# Patient Record
Sex: Female | Born: 1955 | Race: White | Hispanic: No | Marital: Married | Smoking: Never smoker
Health system: Southern US, Community
[De-identification: ages and names within clinical notes are randomized; demographics above are authoritative.]

## PROBLEM LIST (undated history)

## (undated) DIAGNOSIS — S14109A Unspecified injury at unspecified level of cervical spinal cord, initial encounter: Secondary | ICD-10-CM

## (undated) DIAGNOSIS — K222 Esophageal obstruction: Secondary | ICD-10-CM

## (undated) DIAGNOSIS — K219 Gastro-esophageal reflux disease without esophagitis: Secondary | ICD-10-CM

## (undated) DIAGNOSIS — N1831 Chronic kidney disease, stage 3a: Secondary | ICD-10-CM

## (undated) DIAGNOSIS — N289 Disorder of kidney and ureter, unspecified: Secondary | ICD-10-CM

## (undated) DIAGNOSIS — E039 Hypothyroidism, unspecified: Secondary | ICD-10-CM

## (undated) DIAGNOSIS — K029 Dental caries, unspecified: Secondary | ICD-10-CM

## (undated) DIAGNOSIS — L03211 Cellulitis of face: Secondary | ICD-10-CM

## (undated) DIAGNOSIS — S329XXA Fracture of unspecified parts of lumbosacral spine and pelvis, initial encounter for closed fracture: Secondary | ICD-10-CM

## (undated) DIAGNOSIS — G9520 Unspecified cord compression: Secondary | ICD-10-CM

## (undated) DIAGNOSIS — J9601 Acute respiratory failure with hypoxia: Secondary | ICD-10-CM

## (undated) DIAGNOSIS — G629 Polyneuropathy, unspecified: Secondary | ICD-10-CM

## (undated) DIAGNOSIS — D759 Disease of blood and blood-forming organs, unspecified: Secondary | ICD-10-CM

## (undated) DIAGNOSIS — M1990 Unspecified osteoarthritis, unspecified site: Secondary | ICD-10-CM

## (undated) DIAGNOSIS — R1310 Dysphagia, unspecified: Secondary | ICD-10-CM

## (undated) DIAGNOSIS — E119 Type 2 diabetes mellitus without complications: Secondary | ICD-10-CM

## (undated) DIAGNOSIS — N179 Acute kidney failure, unspecified: Secondary | ICD-10-CM

## (undated) DIAGNOSIS — G8250 Quadriplegia, unspecified: Secondary | ICD-10-CM

## (undated) DIAGNOSIS — K5790 Diverticulosis of intestine, part unspecified, without perforation or abscess without bleeding: Secondary | ICD-10-CM

## (undated) DIAGNOSIS — S14129A Central cord syndrome at unspecified level of cervical spinal cord, initial encounter: Secondary | ICD-10-CM

## (undated) HISTORY — PX: WISDOM TOOTH EXTRACTION: SHX21

## (undated) HISTORY — PX: ABDOMINAL HYSTERECTOMY: SHX81

## (undated) HISTORY — PX: TUBAL LIGATION: SHX77

## (undated) HISTORY — PX: TEAR DUCT PROBING: SHX793

---

## 2004-12-31 ENCOUNTER — Emergency Department: Payer: Self-pay | Admitting: General Practice

## 2005-04-09 ENCOUNTER — Encounter: Payer: Self-pay | Admitting: Family Medicine

## 2005-04-22 ENCOUNTER — Emergency Department: Payer: Self-pay | Admitting: Emergency Medicine

## 2005-05-07 ENCOUNTER — Encounter: Payer: Self-pay | Admitting: Family Medicine

## 2005-08-16 ENCOUNTER — Other Ambulatory Visit: Payer: Self-pay

## 2005-08-17 ENCOUNTER — Observation Stay: Payer: Self-pay | Admitting: Internal Medicine

## 2006-07-29 ENCOUNTER — Ambulatory Visit: Payer: Self-pay | Admitting: Ophthalmology

## 2006-08-06 ENCOUNTER — Inpatient Hospital Stay: Payer: Self-pay | Admitting: Otolaryngology

## 2006-12-30 ENCOUNTER — Ambulatory Visit: Payer: Self-pay

## 2008-01-09 ENCOUNTER — Emergency Department: Payer: Self-pay | Admitting: Emergency Medicine

## 2008-01-10 ENCOUNTER — Other Ambulatory Visit: Payer: Self-pay

## 2008-12-06 ENCOUNTER — Ambulatory Visit: Payer: Self-pay

## 2009-04-12 ENCOUNTER — Inpatient Hospital Stay: Payer: Self-pay | Admitting: Internal Medicine

## 2012-01-18 ENCOUNTER — Emergency Department: Payer: Self-pay | Admitting: Emergency Medicine

## 2012-01-18 LAB — BASIC METABOLIC PANEL
Anion Gap: 6 — ABNORMAL LOW (ref 7–16)
BUN: 18 mg/dL (ref 7–18)
Calcium, Total: 8.6 mg/dL (ref 8.5–10.1)
Chloride: 108 mmol/L — ABNORMAL HIGH (ref 98–107)
Creatinine: 0.93 mg/dL (ref 0.60–1.30)
EGFR (African American): >60
Glucose: 168 mg/dL — ABNORMAL HIGH (ref 65–99)
Osmolality: 291 (ref 275–301)
Potassium: 3.8 mmol/L (ref 3.5–5.1)

## 2012-01-18 LAB — CBC WITH DIFFERENTIAL/PLATELET
Basophil #: 0 10*3/uL (ref 0.0–0.1)
Eosinophil %: 1.3 %
HCT: 39.2 % (ref 35.0–47.0)
Lymphocyte #: 2.1 10*3/uL (ref 1.0–3.6)
MCV: 88 fL (ref 80–100)
Monocyte #: 0.5 x10 3/mm (ref 0.2–0.9)
Monocyte %: 9.4 %
Neutrophil %: 51.4 %
Platelet: 143 10*3/uL — ABNORMAL LOW (ref 150–440)
RBC: 4.45 10*6/uL (ref 3.80–5.20)
RDW: 13.8 % (ref 11.5–14.5)
WBC: 5.7 10*3/uL (ref 3.6–11.0)

## 2012-01-21 ENCOUNTER — Emergency Department: Payer: Self-pay | Admitting: Internal Medicine

## 2013-02-19 LAB — BASIC METABOLIC PANEL
Chloride: 104 mmol/L (ref 98–107)
Creatinine: 1.26 mg/dL (ref 0.60–1.30)
EGFR (Non-African Amer.): 48 — ABNORMAL LOW
Glucose: 315 mg/dL — ABNORMAL HIGH (ref 65–99)
Osmolality: 288 (ref 275–301)
Sodium: 136 mmol/L (ref 136–145)

## 2013-02-19 LAB — HEPATIC FUNCTION PANEL A (ARMC)
Albumin: 4.2 g/dL (ref 3.4–5.0)
Alkaline Phosphatase: 121 U/L (ref 50–136)
Bilirubin, Direct: <0.1 mg/dL (ref 0.00–0.20)
SGOT(AST): 57 U/L — ABNORMAL HIGH (ref 15–37)
SGPT (ALT): 56 U/L (ref 12–78)

## 2013-02-19 LAB — CBC
HCT: 41.9 % (ref 35.0–47.0)
HGB: 14.8 g/dL (ref 12.0–16.0)
WBC: 7.3 10*3/uL (ref 3.6–11.0)

## 2013-02-19 LAB — URINALYSIS, COMPLETE
Bilirubin,UR: NEGATIVE
Glucose,UR: >=500 mg/dL (ref 0–75)
Hyaline Cast: 10
Nitrite: NEGATIVE
Ph: 5 (ref 4.5–8.0)
Protein: 30
Specific Gravity: 1.023 (ref 1.003–1.030)
WBC UR: 123 /HPF (ref 0–5)

## 2013-02-19 LAB — PRO B NATRIURETIC PEPTIDE: B-Type Natriuretic Peptide: 139 pg/mL — ABNORMAL HIGH (ref 0–125)

## 2013-02-19 LAB — TROPONIN I: Troponin-I: <0.02 ng/mL

## 2013-02-19 LAB — MAGNESIUM: Magnesium: 1.6 mg/dL — ABNORMAL LOW

## 2013-02-20 ENCOUNTER — Observation Stay: Payer: Self-pay | Admitting: Internal Medicine

## 2013-02-20 LAB — TROPONIN I: Troponin-I: 0.05 ng/mL

## 2013-02-20 LAB — HEMOGLOBIN A1C: Hemoglobin A1C: 10.3 % — ABNORMAL HIGH (ref 4.2–6.3)

## 2013-02-20 LAB — TSH: Thyroid Stimulating Horm: 9.96 u[IU]/mL — ABNORMAL HIGH

## 2013-02-21 LAB — BASIC METABOLIC PANEL
Anion Gap: 3 — ABNORMAL LOW (ref 7–16)
Calcium, Total: 8.1 mg/dL — ABNORMAL LOW (ref 8.5–10.1)
EGFR (African American): >60
EGFR (Non-African Amer.): >60

## 2013-02-24 LAB — CULTURE, BLOOD (SINGLE)

## 2013-03-15 ENCOUNTER — Ambulatory Visit: Payer: Self-pay | Admitting: Family Medicine

## 2013-04-06 ENCOUNTER — Ambulatory Visit: Payer: Self-pay | Admitting: Family Medicine

## 2013-10-05 ENCOUNTER — Ambulatory Visit: Payer: Self-pay | Admitting: Family Medicine

## 2014-08-30 ENCOUNTER — Emergency Department: Payer: Self-pay | Admitting: Emergency Medicine

## 2014-10-27 NOTE — H&P (Signed)
PATIENT NAME:  [name], [name] MR#:  [id number] DATE OF BIRTH:  1955/10/02  DATE OF ADMISSION:  02/20/2013  PRIMARY CARE PHYSICIAN: She does not recall the name of her physician, but belongs to  [facility], not locally in this area.   REFERRING PHYSICIAN: Dr. Arman Filter.   CHIEF COMPLAINT: Multitude of complaints that happened all today; including vomiting, questionable syncope, epigastric discomfort, mild left-sided chest pain.   HISTORY OF PRESENT ILLNESS: [name] is a 59 year old Caucasian female who works at [employer] and suddenly today she felt an indigestion-feeling along with heartburn. She went to the bathroom and she regurgitated or vomited some digested food, following which she felt generalized weakness and probably she fainted, but she is unsure. Her husband is with her and he tells me that the employees/staff at [employer] told him that probably she fainted briefly. EMS was called and the patient was transported. En route, she had another episode of vomiting, after which she had mild cough. Thereafter, she stated that she received DuoNeb breathing treatment here at the Emergency Department, after which she developed left-sided chest burning sensation, tingling-like feeling, it was very mild. Along with that, she had some epigastric discomfort and mild left upper quadrant discomfort. She denies any shortness of breath. She is unsure if her symptoms are consistent with her previous pancreatitis that she had a few years ago. However, her lipase here is normal. Her abdomen is soft.  Her chest x-ray is negative and her troponin is normal and, again, her lipase is normal. The patient was admitted for observation and followup on her symptoms. Additionally, she was found to have underlying urinary tract infection.   REVIEW OF SYSTEMS:   CONSTITUTIONAL: Denies any fever. No chills. No fatigue, only after she vomited.  EYES: No blurring of vision. No double vision.  ENT: No hearing  impairment. No sore throat. No dysphagia.  CARDIOVASCULAR: No chest pain, only after the breathing treatment she had some tingling sensation on the left side of the chest. No shortness of breath. No edema.   RESPIRATORY: As above, tingling sensation on the left side of the chest after the breathing treatment. No genuine chest pain. No shortness of breath. She describes mild cough after she vomited. Currently, she is not coughing. She is not short of breath.  GASTROINTESTINAL: As described above. Reports mild epigastric discomfort and left upper quadrant discomfort and this appears to be mild and trivial. Vomiting as described above. No hematemesis, no melena, no diarrhea.  GENITOURINARY: No dysuria. No frequency of urination.  MUSCULOSKELETAL: No joint pain or swelling. No muscular pain or swelling.  INTEGUMENTARY: No skin rash. No ulcers.  NEUROLOGY: No focal weakness. No seizure activity. No headache.  PSYCHIATRY: She appears anxious and does not keep in eye contact and concerned about her condition. No depression.  ENDOCRINE: No polyuria or polydipsia. No heat or cold intolerance.   PAST MEDICAL HISTORY: Hypertension, hyperlipidemia, hypothyroidism. She was on Synthroid, but she stopped the medicine by herself. History of diverticulosis and diverticulitis. History of GI bleed with hematochezia, postpolypectomy that was in 2010.   PAST SURGICAL HISTORY: She says none.   FAMILY HISTORY: Her mother is still alive. She is an 16 year old, suffers from osteoarthritis and some heart problem; not specified. Her father died at age of 106 from a heart attack. She has a brother and sister who have palpitations, and they suffer from diabetes mellitus.   SOCIAL HABITS: Nonsmoker. No history of alcohol abuse or drug abuse.   SOCIAL  HISTORY: She is married, living with her husband. 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   ADMISSION MEDICATIONS:  None. She may take over-the-counter Aleve p.r.n.    ALLERGIES: No known drug allergies.   PHYSICAL EXAMINATION: VITAL SIGNS: Her blood pressure 139/62, respiratory rate 20, pulse 135.  Her pulse is down to normal by the time I examined her. It is in the 90s. Temperature 98.4, oxygen saturation 95%.  GENERAL APPEARANCE: This is a middle-aged female sitting on the stretcher, in no acute distress.   HEENT: Head and neck examination: No pallor. No icterus. No cyanosis. Ear examination revealed normal hearing, no discharge, no lesions. Examination of the nose showed no bleeding, no ulcers, no discharge. Examination of the mouth showed normal lips and tongue. No oral thrush, no exudates, no ulcers. Eye examination revealed normal eyelids and conjunctivae. Both pupils are constricted.  NECK: Supple. Trachea at midline. No thyromegaly. No cervical lymphadenopathy. No masses.  HEART: Normal S1, S2. No S3, S4. No murmur. No gallop. No carotid bruits.  RESPIRATORY: Normal breathing pattern without use of accessory muscles. No rales. No wheezing.  ABDOMEN: Soft, without tenderness even with deep palpation. No rebound. No rigidity. No hernias. No hepatosplenomegaly.  SKIN: No ulcers. No subcutaneous nodules.  MUSCULOSKELETAL: No joint swelling. No clubbing.  NEUROLOGIC: Cranial nerves II through XII are intact. No focal motor deficit.  PSYCHIATRIC: The patient is alert and oriented x3. Mood and affect were flat. The patient does not keep an eye contact and she keeps closing her eyes.   LABORATORY FINDINGS: Her chest x-ray showed no acute cardiopulmonary abnormalities. EKG showed sinus tachycardia at the rate of 108 per minute, right bundle branch block; otherwise, unremarkable EKG. Glucose is elevated at 315, BUN 24, creatinine 1.2, sodium 136, potassium 4. Magnesium 1.6. Normal liver function tests and liver transaminases, except for mild elevation of AST at 57. Troponin less than 0.02. CBC showed white count of 7000, hemoglobin 14, hematocrit 41, platelet  count 150. D-dimer 0.4. Urinalysis showed cloudy urine, more than 500 glucose, +3 leukocyte esterase and 123 white blood cells, +2 bacteria. ABG showed a pH of 7.38, pCO2 of 34, pO2 of 79 and that was on room air.   ASSESSMENT: 1.  Vomiting, likely secondary to gastritis.  2. Questionable syncope after having generalized weakness after her vomiting. This could be vasovagal episode.  3.  Trivial mild left-sided chest pain in the form of tingling sensation after her DuoNeb treatment.  4.  Urinary tract infection. 5. Elevated blood sugar and indicating underlying diabetes mellitus. The patient is unaware about it.  6.  Systemic hypertension.  7.  Hypothyroidism. The patient has stopped the Synthroid.  8.  History of hyperlipidemia.  9.  Right bundle branch block by EKG. 10.  Noncompliance.  11.  History of diverticulosis. 12.  History of pancreatitis.   PLAN: We will admit the patient for observation on telemetry, follow up on troponin. IV hydration with normal saline. Zofran p.r.n. for nausea and vomiting. I will place the patient on liquids. Monitor blood sugar; Accu-Chek and sliding scale. Advance BRAT diet as tolerated. I will place the patient on PPI like Protonix. Monitor for any recurrence of syncope or near syncope. The patient is at mild risk for DVT, therefore, no anticoagulation was given.   Time spent in evaluating this patient took more than 55 minutes.     ____________________________ Clovis Pu. Lenore Manner, MD amd:nts D: 02/20/2013 02:02:00 ET T: 02/20/2013 02:30:30 ET JOB#: [id number]  cc: Mike Craze  Irven Coe, MD, <Dictator> Mike Craze Irven Coe MD ELECTRONICALLY SIGNED 02/20/2013 5:46

## 2014-10-27 NOTE — Discharge Summary (Signed)
PATIENT NAME:  [name], [name] MR#:  [id number] DATE OF BIRTH:  [birth year]  DATE OF ADMISSION:  02/20/2013 DATE OF DISCHARGE:  02/21/2013  ADMITTING PHYSICIAN: Dr. Lenore Manner.  DISCHARGING PHYSICIAN: Dr. Gladstone Lighter.  PRIMARY CARE PHYSICIAN: None.   Sawyer: None.   DISCHARGE DIAGNOSES: 1.  Vasovagal syncope.  2.  Gastritis with nausea and vomiting.  3.  New-onset diabetes mellitus with hemoglobin A1c of 10.3.  4.  Severe diabetic peripheral neuropathy.  5.  Hypothyroidism.   DISCHARGE HOME MEDICATIONS:  1.  Metformin 1000 mg p.o. b.i.d.  2.  Glipizide 5 mg p.o. daily in the morning.  3.  Levothyroxine 50 mcg p.o. daily.  5.  Gabapentin 100 mg p.o. b.i.d.   DISCHARGE DIET: ADA 1800 calorie diet.   DISCHARGE ACTIVITY:  As tolerated.   FOLLOWUP INSTRUCTIONS:  PCP followup in 1 to 2 weeks.   LABORATORY DATA AND IMAGING STUDIES PRIOR TO DISCHARGE: Sodium 142, potassium 3.9, chloride 113, bicarb 26, BUN 16, creatinine 0.82, glucose 97 and calcium of 8.1.   HbA1c is 10.3, free thyroxine is 0.87. TSH is elevated at 9.96. D-dimer was normal at 0.43.   ALT 56, AST 57, alk phos 121, total bilirubin 0.4 and albumin of 4.2. Lipase 364. Blood cultures remain negative. Urine cultures growing Strep agalactiae. Chest x-ray showing mildly prominent interstitial markings.   WBC 7.3, hemoglobin 14.8, hematocrit 41.9. Platelet count is 150.   BRIEF HOSPITAL COURSE: [name] is a 59 year old female with a past medical history significant for only hypertension and hyperlipidemia, not taking any medications at home, presents to the hospital secondary to syncopal episode after an episode of severe nausea and vomiting.  1.  Syncope, triggered by nausea, vomiting, likely vasovagal in nature. Given IV fluids, monitored on telemetry. No arrhythmias were noted. No further syncopal episodes while in the hospital.  2.  New onset diabetes mellitus with HbA1c of 10.3. Likely, the  patient's nausea and vomiting was triggered by her elevated sugars. Was noted to have sugars in the 300s. The patient has financial problems and does not want to be started on insulin, so she was started on metformin and glipizide. Surprisingly, her sugars were very well controlled with those 2 medications while in the hospital. She had a dietitian consult and also diabetic teaching was done about lifestyle modifications prior to discharge.  3. Diabetic neuropathy. Started on Neurontin in the hospital because Lyrica was expensive. The patient has shown improvement in her symptoms, so was being discharged home on the same.   Her course has been otherwise uneventful in the hospital.   DISCHARGE CONDITION: Stable.   DISCHARGE DISPOSITION: Home.   TIME SPENT ON DISCHARGE: 40 minutes.     ____________________________ Gladstone Lighter, MD rk:dmm D: 02/23/2013 14:57:42 ET T: 02/23/2013 20:11:20 ET JOB#: [id number]  cc: Gladstone Lighter, MD, <Dictator> Gladstone Lighter MD ELECTRONICALLY SIGNED 02/28/2013 22:15

## 2014-11-11 ENCOUNTER — Emergency Department
Admission: EM | Admit: 2014-11-11 | Discharge: 2014-11-12 | Disposition: A | Discharge status: Home / Self Care | Payer: BLUE CROSS/BLUE SHIELD | Attending: Emergency Medicine | Admitting: Emergency Medicine

## 2014-11-11 ENCOUNTER — Emergency Department: Payer: BLUE CROSS/BLUE SHIELD

## 2014-11-11 DIAGNOSIS — Y998 Other external cause status: Secondary | ICD-10-CM | POA: Diagnosis not present

## 2014-11-11 DIAGNOSIS — Y9389 Activity, other specified: Secondary | ICD-10-CM | POA: Diagnosis not present

## 2014-11-11 DIAGNOSIS — W228XXA Striking against or struck by other objects, initial encounter: Secondary | ICD-10-CM | POA: Insufficient documentation

## 2014-11-11 DIAGNOSIS — Y9289 Other specified places as the place of occurrence of the external cause: Secondary | ICD-10-CM | POA: Insufficient documentation

## 2014-11-11 DIAGNOSIS — S62525A Nondisplaced fracture of distal phalanx of left thumb, initial encounter for closed fracture: Secondary | ICD-10-CM | POA: Insufficient documentation

## 2014-11-11 DIAGNOSIS — S62502A Fracture of unspecified phalanx of left thumb, initial encounter for closed fracture: Secondary | ICD-10-CM

## 2014-11-11 DIAGNOSIS — S6992XA Unspecified injury of left wrist, hand and finger(s), initial encounter: Secondary | ICD-10-CM | POA: Diagnosis present

## 2014-11-11 MED ORDER — IBUPROFEN 800 MG PO TABS
800.0000 mg | ORAL_TABLET | Freq: Once | ORAL | Status: AC
  Administered 2014-11-12: 800 mg via ORAL

## 2014-11-11 MED ORDER — IBUPROFEN 800 MG PO TABS: 800.0000 mg | ORAL_TABLET | Freq: Three times a day (TID) | ORAL | Status: DC | PRN

## 2014-11-11 MED ORDER — TRAMADOL HCL 50 MG PO TABS
50.0000 mg | ORAL_TABLET | Freq: Once | ORAL | Status: AC
  Administered 2014-11-12: 50 mg via ORAL

## 2014-11-11 MED ORDER — TRAMADOL HCL 50 MG PO TABS
50.0000 mg | ORAL_TABLET | Freq: Four times a day (QID) | ORAL | Status: DC | PRN
Start: 2014-11-11 — End: 2015-10-05

## 2014-11-11 NOTE — ED Notes (Signed)
Pt presents to ER stating left thumb pain after broom hit it. Pt has swelling and bruising noted. Ice pack applied.

## 2014-11-11 NOTE — Discharge Instructions (Signed)

## 2014-11-11 NOTE — ED Provider Notes (Signed)
[facility] Emergency Department Provider Note    ____________________________________________  Time seen: 2240  I have reviewed the triage vital signs and the nursing notes.   HISTORY  Chief Complaint Finger Injury       HPI [name] is a 59 y.o. female hit her left thumb on a broom handle has swelling and bruising to the area rates pain as about 5-6 out of 10 with movement and relieved with rest is been in a splint for protection denies any numbness tingling or weakness there is some mild swelling no other associated signs or symptoms except for noted     No past medical history on file.  There are no active problems to display for this patient.   No past surgical history on file.  Current Outpatient Rx  Name  Route  Sig  Dispense  Refill  . ibuprofen (ADVIL,MOTRIN) 800 MG tablet   Oral   Take 1 tablet (800 mg total) by mouth every 8 (eight) hours as needed.   30 tablet   0   . traMADol (ULTRAM) 50 MG tablet   Oral   Take 1 tablet (50 mg total) by mouth every 6 (six) hours as needed.   12 tablet   0     Allergies Review of patient's allergies indicates no known allergies.  No family history on file.  Social History History  Substance Use Topics  . Smoking status: Not on file  . Smokeless tobacco: Not on file  . Alcohol Use: Not on file    Review of Systems  Constitutional: Negative for fever. Eyes: Negative for visual changes. ENT: Negative for sore throat. Cardiovascular: Negative for chest pain. Respiratory: Negative for shortness of breath. Gastrointestinal: Negative for abdominal pain, vomiting and diarrhea. Genitourinary: Negative for dysuria. Musculoskeletal: Negative for back pain. Skin: Negative for rash. Neurological: Negative for headaches, focal weakness or numbness.   10-point ROS otherwise negative.  ____________________________________________   PHYSICAL EXAM:  VITAL SIGNS: ED Triage  Vitals  Enc Vitals Group     BP 11/11/14 2112 134/65 mmHg     Pulse Rate 11/11/14 2112 95     Resp 11/11/14 2112 20     Temp 11/11/14 2112 97.1 F (36.2 C)     Temp Source 11/11/14 2112 Oral     SpO2 11/11/14 2112 96 %     Weight 11/11/14 2112 150 lb (68.04 kg)     Height 11/11/14 2112 5\' 7"  (1.702 m)     Head Cir --      Peak Flow --      Pain Score 11/11/14 2112 7     Pain Loc --      Pain Edu? --      Excl. in [place]? --      Constitutional: Alert and oriented. Well appearing and in no distress. Eyes: Conjunctivae are normal. PERRL. Normal extraocular movements. ENT   Head: Normocephalic and atraumatic.   Nose: No congestion/rhinnorhea.   Mouth/Throat: Mucous membranes are moist.   Neck: No stridor.  Cardiovascular: Normal rate, regular rhythm. Normal and symmetric distal pulses are present in all extremities. No murmurs, rubs, or gallops. Respiratory: Normal respiratory effort without tachypnea nor retractions. Breath sounds are clear and equal bilaterally. No wheezes/rales/rhonchi.  Musculoskeletal: Tenderness with palpation to the left thumb but has a full range of motion no palpable deformities or abnormalities Neurologic:  Normal speech and language. No gross focal neurologic deficits are appreciated. Speech is normal. No gait instability. Skin:  Bruising to the left thumb Psychiatric: Mood and affect are normal. Speech and behavior are normal. Patient exhibits appropriate insight and judgment.  ____________________________________________        RADIOLOGY  IMPRESSION: Suspected nondisplaced intra-articular fracture at base of distal phalanx LEFT thumb ; recommend correlation for pain/tenderness at this site.  Degenerative changes LEFT first MCP joint.   Electronically Signed By: Lavonia Dana M.D. On: 11/11/2014 21:46  ____________________________________________   PROCEDURES  Procedure(s) performed: None  Critical Care performed:  No  ____________________________________________   INITIAL IMPRESSION / ASSESSMENT AND PLAN / ED COURSE  Pertinent labs & imaging results that were available during my care of the patient were reviewed by me and considered in my medical decision making (see chart for details).  Undisplaced fracture at the base of the distal phalanx of the left thumb patient be placed in a splint given prescription for Ultram and Motrin follow-up with Ortholign one week  ____________________________________________   FINAL CLINICAL IMPRESSION(S) / ED DIAGNOSES  Final diagnoses:  Thumb fracture, left, closed, initial encounter    Jonita Hirota Verdene Rio, PA-C 11/11/14 [place], MD 11/12/14 [phone]

## 2014-11-12 MED ORDER — IBUPROFEN 800 MG PO TABS
ORAL_TABLET | ORAL | Status: AC
  Administered 2014-11-12: 800 mg via ORAL
  Filled 2014-11-12: qty 1

## 2014-11-12 MED ORDER — TRAMADOL HCL 50 MG PO TABS
ORAL_TABLET | ORAL | Status: AC
  Administered 2014-11-12: 50 mg via ORAL
  Filled 2014-11-12: qty 1

## 2014-11-12 NOTE — ED Notes (Signed)
Discharge instructions reviewed with patient. Questions fielded by this RN. Patient verbalizes understanding of instructions. Patient discharged home in stable condition per ruffian. No acute distress noted at time of discharge. No peripheral IV placed this visit.

## 2015-03-03 ENCOUNTER — Encounter: Payer: Self-pay | Admitting: Emergency Medicine

## 2015-03-03 ENCOUNTER — Emergency Department
Admission: EM | Admit: 2015-03-03 | Discharge: 2015-03-03 | Disposition: A | Discharge status: Home / Self Care | Payer: BLUE CROSS/BLUE SHIELD | Attending: Emergency Medicine | Admitting: Emergency Medicine

## 2015-03-03 DIAGNOSIS — N3090 Cystitis, unspecified without hematuria: Secondary | ICD-10-CM | POA: Insufficient documentation

## 2015-03-03 DIAGNOSIS — F329 Major depressive disorder, single episode, unspecified: Secondary | ICD-10-CM | POA: Insufficient documentation

## 2015-03-03 DIAGNOSIS — R531 Weakness: Secondary | ICD-10-CM

## 2015-03-03 DIAGNOSIS — E119 Type 2 diabetes mellitus without complications: Secondary | ICD-10-CM | POA: Insufficient documentation

## 2015-03-03 DIAGNOSIS — R5383 Other fatigue: Secondary | ICD-10-CM | POA: Diagnosis present

## 2015-03-03 HISTORY — DX: Type 2 diabetes mellitus without complications: E11.9

## 2015-03-03 LAB — URINALYSIS COMPLETE WITH MICROSCOPIC (ARMC ONLY)
Bilirubin Urine: NEGATIVE
Glucose, UA: >500 mg/dL — AB
HGB URINE DIPSTICK: NEGATIVE
Ketones, ur: NEGATIVE mg/dL
Nitrite: NEGATIVE
PH: 5 (ref 5.0–8.0)
Protein, ur: 30 mg/dL — AB
Specific Gravity, Urine: 1.017 (ref 1.005–1.030)

## 2015-03-03 LAB — BASIC METABOLIC PANEL
Anion gap: 13 (ref 5–15)
BUN: 32 mg/dL — ABNORMAL HIGH (ref 6–20)
CHLORIDE: 104 mmol/L (ref 101–111)
CO2: 22 mmol/L (ref 22–32)
CREATININE: 1.35 mg/dL — AB (ref 0.44–1.00)
Calcium: 9.7 mg/dL (ref 8.9–10.3)
GFR calc non Af Amer: 43 mL/min — ABNORMAL LOW (ref >60)
GFR, EST AFRICAN AMERICAN: 50 mL/min — AB (ref >60)
Glucose, Bld: 241 mg/dL — ABNORMAL HIGH (ref 65–99)
Potassium: 4.5 mmol/L (ref 3.5–5.1)
Sodium: 139 mmol/L (ref 135–145)

## 2015-03-03 LAB — CBC
HCT: 40.7 % (ref 35.0–47.0)
Hemoglobin: 13.6 g/dL (ref 12.0–16.0)
MCH: 28.3 pg (ref 26.0–34.0)
MCHC: 33.3 g/dL (ref 32.0–36.0)
MCV: 85 fL (ref 80.0–100.0)
PLATELETS: 161 10*3/uL (ref 150–440)
RBC: 4.79 MIL/uL (ref 3.80–5.20)
RDW: 13.3 % (ref 11.5–14.5)
WBC: 11 10*3/uL (ref 3.6–11.0)

## 2015-03-03 MED ORDER — SULFAMETHOXAZOLE-TRIMETHOPRIM 800-160 MG PO TABS: 1.0000 | ORAL_TABLET | Freq: Two times a day (BID) | ORAL | Status: DC

## 2015-03-03 MED ORDER — DEXTROSE 5 % IV SOLN
1.0000 g | Freq: Once | INTRAVENOUS | Status: AC
  Administered 2015-03-03: 1 g via INTRAVENOUS
  Filled 2015-03-03: qty 10

## 2015-03-03 MED ORDER — DIAZEPAM 5 MG PO TABS: 5.0000 mg | ORAL_TABLET | Freq: Three times a day (TID) | ORAL | Status: AC | PRN

## 2015-03-03 MED ORDER — LORAZEPAM 2 MG/ML IJ SOLN
1.0000 mg | Freq: Once | INTRAMUSCULAR | Status: AC
  Administered 2015-03-03: 1 mg via INTRAVENOUS
  Filled 2015-03-03: qty 1

## 2015-03-03 MED ORDER — SODIUM CHLORIDE 0.9 % IV SOLN
Freq: Once | INTRAVENOUS | Status: AC
  Administered 2015-03-03: 19:00:00 via INTRAVENOUS
  Filled 2015-03-03: qty 1000

## 2015-03-03 NOTE — Discharge Instructions (Signed)
Fatigue °Fatigue is a feeling of tiredness, lack of energy, lack of motivation, or feeling tired all the time. Having enough rest, good nutrition, and reducing stress will normally reduce fatigue. Consult your caregiver if it persists. The nature of your fatigue will help your caregiver to find out its cause. The treatment is based on the cause.  °CAUSES  °There are many causes for fatigue. Most of the time, fatigue can be traced to one or more of your habits or routines. Most causes fit into one or more of three general areas. They are: °Lifestyle problems °· Sleep disturbances. °· Overwork. °· Physical exertion. °· Unhealthy habits. °· Poor eating habits or eating disorders. °· Alcohol and/or drug use . °· Lack of proper nutrition (malnutrition). °Psychological problems °· Stress and/or anxiety problems. °· Depression. °· Grief. °· Boredom. °Medical Problems or Conditions °· Anemia. °· Pregnancy. °· Thyroid gland problems. °· Recovery from major surgery. °· Continuous pain. °· Emphysema or asthma that is not well controlled °· Allergic conditions. °· Diabetes. °· Infections (such as mononucleosis). °· Obesity. °· Sleep disorders, such as sleep apnea. °· Heart failure or other heart-related problems. °· Cancer. °· Kidney disease. °· Liver disease. °· Effects of certain medicines such as antihistamines, cough and cold remedies, prescription pain medicines, heart and blood pressure medicines, drugs used for treatment of cancer, and some antidepressants. °SYMPTOMS  °The symptoms of fatigue include:  °· Lack of energy. °· Lack of drive (motivation). °· Drowsiness. °· Feeling of indifference to the surroundings. °DIAGNOSIS  °The details of how you feel help guide your caregiver in finding out what is causing the fatigue. You will be asked about your present and past health condition. It is important to review all medicines that you take, including prescription and non-prescription items. A thorough exam will be done.  You will be questioned about your feelings, habits, and normal lifestyle. Your caregiver may suggest blood tests, urine tests, or other tests to look for common medical causes of fatigue.  °TREATMENT  °Fatigue is treated by correcting the underlying cause. For example, if you have continuous pain or depression, treating these causes will improve how you feel. Similarly, adjusting the dose of certain medicines will help in reducing fatigue.  °HOME CARE INSTRUCTIONS  °· Try to get the required amount of good sleep every night. °· Eat a healthy and nutritious diet, and drink enough water throughout the day. °· Practice ways of relaxing (including yoga or meditation). °· Exercise regularly. °· Make plans to change situations that cause stress. Act on those plans so that stresses decrease over time. Keep your work and personal routine reasonable. °· Avoid street drugs and minimize use of alcohol. °· Start taking a daily multivitamin after consulting your caregiver. °SEEK MEDICAL CARE IF:  °· You have persistent tiredness, which cannot be accounted for. °· You have fever. °· You have unintentional weight loss. °· You have headaches. °· You have disturbed sleep throughout the night. °· You are feeling sad. °· You have constipation. °· You have dry skin. °· You have gained weight. °· You are taking any new or different medicines that you suspect are causing fatigue. °· You are unable to sleep at night. °· You develop any unusual swelling of your legs or other parts of your body. °SEEK IMMEDIATE MEDICAL CARE IF:  °· You are feeling confused. °· Your vision is blurred. °· You feel faint or pass out. °· You develop severe headache. °· You develop severe abdominal, pelvic, or   back pain.  You develop chest pain, shortness of breath, or an irregular or fast heartbeat.  You are unable to pass a normal amount of urine.  You develop abnormal bleeding such as bleeding from the rectum or you vomit blood.  You have thoughts  about harming yourself or committing suicide.  You are worried that you might harm someone else. MAKE SURE YOU:   Understand these instructions.  Will watch your condition.  Will get help right away if you are not doing well or get worse. Document Released: 04/20/2007 Document Revised: 09/15/2011 Document Reviewed: 10/25/2013 [facility] Patient Information 2015 [place], [place]. This information is not intended to replace advice given to you by your health care provider. Make sure you discuss any questions you have with your health care provider.  Urinary Tract Infection Urinary tract infections (UTIs) can develop anywhere along your urinary tract. Your urinary tract is your body's drainage system for removing wastes and extra water. Your urinary tract includes two kidneys, two ureters, a bladder, and a urethra. Your kidneys are a pair of bean-shaped organs. Each kidney is about the size of your fist. They are located below your ribs, one on each side of your spine. CAUSES Infections are caused by microbes, which are microscopic organisms, including fungi, viruses, and bacteria. These organisms are so small that they can only be seen through a microscope. Bacteria are the microbes that most commonly cause UTIs. SYMPTOMS  Symptoms of UTIs may vary by age and gender of the patient and by the location of the infection. Symptoms in young women typically include a frequent and intense urge to urinate and a painful, burning feeling in the bladder or urethra during urination. Older women and men are more likely to be tired, shaky, and weak and have muscle aches and abdominal pain. A fever may mean the infection is in your kidneys. Other symptoms of a kidney infection include pain in your back or sides below the ribs, nausea, and vomiting. DIAGNOSIS To diagnose a UTI, your caregiver will ask you about your symptoms. Your caregiver also will ask to provide a urine sample. The urine sample will be tested for  bacteria and white blood cells. White blood cells are made by your body to help fight infection. TREATMENT  Typically, UTIs can be treated with medication. Because most UTIs are caused by a bacterial infection, they usually can be treated with the use of antibiotics. The choice of antibiotic and length of treatment depend on your symptoms and the type of bacteria causing your infection. HOME CARE INSTRUCTIONS  If you were prescribed antibiotics, take them exactly as your caregiver instructs you. Finish the medication even if you feel better after you have only taken some of the medication.  Drink enough water and fluids to keep your urine clear or pale yellow.  Avoid caffeine, tea, and carbonated beverages. They tend to irritate your bladder.  Empty your bladder often. Avoid holding urine for long periods of time.  Empty your bladder before and after sexual intercourse.  After a bowel movement, women should cleanse from front to back. Use each tissue only once. SEEK MEDICAL CARE IF:   You have back pain.  You develop a fever.  Your symptoms do not begin to resolve within 3 days. SEEK IMMEDIATE MEDICAL CARE IF:   You have severe back pain or lower abdominal pain.  You develop chills.  You have nausea or vomiting.  You have continued burning or discomfort with urination. MAKE SURE YOU:  Understand these instructions.  Will watch your condition.  Will get help right away if you are not doing well or get worse. Document Released: 04/02/2005 Document Revised: 12/23/2011 Document Reviewed: 08/01/2011 [facility] Patient Information 2015 [place], [place]. This information is not intended to replace advice given to you by your health care provider. Make sure you discuss any questions you have with your health care provider.

## 2015-03-03 NOTE — ED Notes (Signed)
Reports started having bodyaches today at work. Skin w/d. BS 260 at triage

## 2015-03-03 NOTE — ED Provider Notes (Signed)
[facility] Emergency Department Provider Note     Time seen:   I have reviewed the triage vital signs and the nursing notes.   HISTORY  Chief Complaint Fatigue    HPI [name] is a 59 y.o. female who presents to ER for body aches and weakness. Husband was concerned because she drank a Pepsi today and she is diabetic. According to report her blood sugar was in the 200s. She denies fevers, chills, chest pain, shortness of breath, nausea vomiting diarrhea. She complains that she is weak, her body hurts.   Past Medical History  Diagnosis Date  . Diabetes mellitus without complication     There are no active problems to display for this patient.   History reviewed. No pertinent past surgical history.  Allergies Review of patient's allergies indicates no known allergies.  Social History Social History  Substance Use Topics  . Smoking status: Never Smoker   . Smokeless tobacco: None  . Alcohol Use: No    Review of Systems Constitutional: Negative for fever. Eyes: Negative for visual changes. ENT: Negative for sore throat. Cardiovascular: Negative for chest pain. Respiratory: Negative for shortness of breath. Gastrointestinal: Negative for abdominal pain, vomiting and diarrhea. Genitourinary: Negative for dysuria. Musculoskeletal: Negative for back pain. Skin: Negative for rash. Neurological: Negative for headaches, positive for weakness  10-point ROS otherwise negative.  ____________________________________________   PHYSICAL EXAM:  VITAL SIGNS: ED Triage Vitals  Enc Vitals Group     BP 03/03/15 1701 144/80 mmHg     Pulse Rate 03/03/15 1701 103     Resp 03/03/15 1701 18     Temp 03/03/15 1701 98.6 F (37 C)     Temp Source 03/03/15 1701 Oral     SpO2 03/03/15 1701 98 %     Weight 03/03/15 1701 145 lb (65.772 kg)     Height 03/03/15 1701 5\' 7"  (1.702 m)     Head Cir --      Peak Flow --      Pain Score --      Pain Loc  --      Pain Edu? --      Excl. in [place]? --     Constitutional: Alert and oriented. Well appearing and in no distress. Eyes: Conjunctivae are normal. PERRL. Normal extraocular movements. ENT   Head: Normocephalic and atraumatic.   Nose: No congestion/rhinnorhea.   Mouth/Throat: Mucous membranes are moist.   Neck: No stridor. Cardiovascular: Normal rate, regular rhythm. Normal and symmetric distal pulses are present in all extremities. No murmurs, rubs, or gallops. Respiratory: Normal respiratory effort without tachypnea nor retractions. Breath sounds are clear and equal bilaterally. No wheezes/rales/rhonchi. Gastrointestinal: Soft and nontender. No distention. No abdominal bruits.  Musculoskeletal: Nontender with normal range of motion in all extremities. No joint effusions.  No lower extremity tenderness nor edema. Neurologic:  Normal speech and language. No gross focal neurologic deficits are appreciated. Speech is normal. No gait instability. Skin:  Skin is warm, dry and intact. No rash noted. Psychiatric: Depressed mood and affect, admits to being stressed by her mom yesterday ____________________________________________  ED COURSE:  Pertinent labs & imaging results that were available during my care of the patient were reviewed by me and considered in my medical decision making (see chart for details). Patient likely with depression or anxiety. No clear medical etiology for her symptoms. ____________________________________________    LABS (pertinent positives/negatives)  Labs Reviewed  BASIC METABOLIC PANEL - Abnormal; Notable for the following:  Glucose, Bld 241 (*)    BUN 32 (*)    Creatinine, Ser 1.35 (*)    GFR calc non Af Amer 43 (*)    GFR calc Af Amer 50 (*)    All other components within normal limits  URINALYSIS COMPLETEWITH MICROSCOPIC ([facility] ONLY) - Abnormal; Notable for the following:    Color, Urine YELLOW (*)    APPearance HAZY (*)    Glucose,  UA >500 (*)    Protein, ur 30 (*)    Leukocytes, UA 3+ (*)    Bacteria, UA RARE (*)    Squamous Epithelial / LPF 0-5 (*)    All other components within normal limits  CBC   ____________________________________________  FINAL ASSESSMENT AND PLAN  Weakness, myalgias, cystitis  Plan: Patient with labs and imaging as dictated above. Patient appeared dehydrated, was given a liter saline, urine culture was sent and she appeared to have a significant UTI. She was also given IV Rocephin 1 g. She is stable for outpatient follow-up with discharge with Bactrim DS.   Earleen Newport, MD   Earleen Newport, MD 03/03/15 [phone]

## 2015-03-03 NOTE — ED Notes (Signed)
MD at bedside. 

## 2015-03-08 ENCOUNTER — Other Ambulatory Visit: Payer: Self-pay | Admitting: Family Medicine

## 2015-03-08 DIAGNOSIS — Z1231 Encounter for screening mammogram for malignant neoplasm of breast: Secondary | ICD-10-CM

## 2015-03-08 DIAGNOSIS — N63 Unspecified lump in unspecified breast: Secondary | ICD-10-CM

## 2015-03-29 ENCOUNTER — Other Ambulatory Visit: Payer: BLUE CROSS/BLUE SHIELD

## 2015-03-29 ENCOUNTER — Ambulatory Visit: Payer: BLUE CROSS/BLUE SHIELD

## 2015-06-12 ENCOUNTER — Ambulatory Visit
Admission: RE | Admit: 2015-06-12 | Discharge: 2015-06-12 | Disposition: A | Discharge status: Home / Self Care | Payer: BLUE CROSS/BLUE SHIELD | Source: Ambulatory Visit | Attending: Family Medicine | Admitting: Family Medicine

## 2015-06-12 DIAGNOSIS — R928 Other abnormal and inconclusive findings on diagnostic imaging of breast: Secondary | ICD-10-CM | POA: Diagnosis not present

## 2015-06-12 DIAGNOSIS — N63 Unspecified lump in unspecified breast: Secondary | ICD-10-CM

## 2015-06-12 DIAGNOSIS — Z1231 Encounter for screening mammogram for malignant neoplasm of breast: Secondary | ICD-10-CM

## 2015-08-11 ENCOUNTER — Emergency Department: Payer: BLUE CROSS/BLUE SHIELD

## 2015-08-11 ENCOUNTER — Emergency Department
Admission: EM | Admit: 2015-08-11 | Discharge: 2015-08-11 | Disposition: A | Discharge status: Home / Self Care | Payer: BLUE CROSS/BLUE SHIELD | Attending: Emergency Medicine | Admitting: Emergency Medicine

## 2015-08-11 ENCOUNTER — Encounter: Payer: Self-pay | Admitting: Emergency Medicine

## 2015-08-11 DIAGNOSIS — Y998 Other external cause status: Secondary | ICD-10-CM | POA: Insufficient documentation

## 2015-08-11 DIAGNOSIS — Z7984 Long term (current) use of oral hypoglycemic drugs: Secondary | ICD-10-CM | POA: Diagnosis not present

## 2015-08-11 DIAGNOSIS — S20212A Contusion of left front wall of thorax, initial encounter: Secondary | ICD-10-CM | POA: Diagnosis not present

## 2015-08-11 DIAGNOSIS — Y9289 Other specified places as the place of occurrence of the external cause: Secondary | ICD-10-CM | POA: Insufficient documentation

## 2015-08-11 DIAGNOSIS — Y9389 Activity, other specified: Secondary | ICD-10-CM | POA: Diagnosis not present

## 2015-08-11 DIAGNOSIS — E119 Type 2 diabetes mellitus without complications: Secondary | ICD-10-CM | POA: Diagnosis not present

## 2015-08-11 DIAGNOSIS — Z792 Long term (current) use of antibiotics: Secondary | ICD-10-CM | POA: Insufficient documentation

## 2015-08-11 DIAGNOSIS — W010XXA Fall on same level from slipping, tripping and stumbling without subsequent striking against object, initial encounter: Secondary | ICD-10-CM | POA: Diagnosis not present

## 2015-08-11 DIAGNOSIS — S299XXA Unspecified injury of thorax, initial encounter: Secondary | ICD-10-CM | POA: Diagnosis present

## 2015-08-11 HISTORY — DX: Unspecified osteoarthritis, unspecified site: M19.90

## 2015-08-11 MED ORDER — IBUPROFEN 800 MG PO TABS
800.0000 mg | ORAL_TABLET | Freq: Once | ORAL | Status: AC
  Administered 2015-08-11: 800 mg via ORAL
  Filled 2015-08-11: qty 1

## 2015-08-11 MED ORDER — OXYCODONE-ACETAMINOPHEN 5-325 MG PO TABS: 1.0000 | ORAL_TABLET | Freq: Four times a day (QID) | ORAL | Status: DC | PRN

## 2015-08-11 MED ORDER — OXYCODONE-ACETAMINOPHEN 5-325 MG PO TABS
1.0000 | ORAL_TABLET | Freq: Once | ORAL | Status: AC
Start: 2015-08-11 — End: 2015-08-11
  Administered 2015-08-11: 1 via ORAL
  Filled 2015-08-11: qty 1

## 2015-08-11 MED ORDER — IBUPROFEN 800 MG PO TABS: 800.0000 mg | ORAL_TABLET | Freq: Three times a day (TID) | ORAL | Status: DC | PRN

## 2015-08-11 MED ORDER — BACITRACIN ZINC 500 UNIT/GM EX OINT
TOPICAL_OINTMENT | Freq: Once | CUTANEOUS | Status: AC
  Administered 2015-08-11: 1 via TOPICAL
  Filled 2015-08-11: qty 0.9
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
## 2015-08-11 NOTE — Discharge Instructions (Signed)
Chest Contusion A chest contusion is a deep bruise on your chest area. Contusions are the result of an injury that caused bleeding under the skin. A chest contusion may involve bruising of the skin, muscles, or ribs. The contusion may turn blue, purple, or yellow. Minor injuries will give you a painless contusion, but more severe contusions may stay painful and swollen for a few weeks. CAUSES  A contusion is usually caused by a blow, trauma, or direct force to an area of the body. SYMPTOMS   Swelling and redness of the injured area.  Discoloration of the injured area.  Tenderness and soreness of the injured area.  Pain. DIAGNOSIS  The diagnosis can be made by taking a history and performing a physical exam. An X-ray, CT scan, or MRI may be needed to determine if there were any associated injuries, such as broken bones (fractures) or internal injuries. TREATMENT  Often, the best treatment for a chest contusion is resting, icing, and applying cold compresses to the injured area. Deep breathing exercises may be recommended to reduce the risk of pneumonia. Over-the-counter medicines may also be recommended for pain control. HOME CARE INSTRUCTIONS   Put ice on the injured area.  Put ice in a plastic bag.  Place a towel between your skin and the bag.  Leave the ice on for 15-20 minutes, 03-04 times a day.  Only take over-the-counter or prescription medicines as directed by your caregiver. Your caregiver may recommend avoiding anti-inflammatory medicines (aspirin, ibuprofen, and naproxen) for 48 hours because these medicines may increase bruising.  Rest the injured area.  Perform deep-breathing exercises as directed by your caregiver.  Stop smoking if you smoke.  Do not lift objects over 5 pounds (2.3 kg) for 3 days or longer if recommended by your caregiver. SEEK IMMEDIATE MEDICAL CARE IF:   You have increased bruising or swelling.  You have pain that is getting worse.  You have  difficulty breathing.  You have dizziness, weakness, or fainting.  You have blood in your urine or stool.  You cough up or vomit blood.  Your swelling or pain is not relieved with medicines. MAKE SURE YOU:   Understand these instructions.  Will watch your condition.  Will get help right away if you are not doing well or get worse.   This information is not intended to replace advice given to you by your health care provider. Make sure you discuss any questions you have with your health care provider.   Document Released: 03/18/2001 Document Revised: 03/17/2012 Document Reviewed: 12/15/2011 Elsevier Interactive Patient Education 2016 [employer].   Take pain medicine as directed. Follow-up with your physician if not improving or return to the emergency room for any concerns.
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
## 2015-10-05 ENCOUNTER — Encounter: Payer: Self-pay | Admitting: Emergency Medicine

## 2015-10-05 ENCOUNTER — Emergency Department
Admission: EM | Admit: 2015-10-05 | Discharge: 2015-10-05 | Disposition: A | Discharge status: Home / Self Care | Payer: BLUE CROSS/BLUE SHIELD | Attending: Emergency Medicine | Admitting: Emergency Medicine

## 2015-10-05 ENCOUNTER — Emergency Department: Payer: BLUE CROSS/BLUE SHIELD

## 2015-10-05 DIAGNOSIS — Z7984 Long term (current) use of oral hypoglycemic drugs: Secondary | ICD-10-CM | POA: Insufficient documentation

## 2015-10-05 DIAGNOSIS — S79921A Unspecified injury of right thigh, initial encounter: Secondary | ICD-10-CM | POA: Insufficient documentation

## 2015-10-05 DIAGNOSIS — Z792 Long term (current) use of antibiotics: Secondary | ICD-10-CM | POA: Insufficient documentation

## 2015-10-05 DIAGNOSIS — M25561 Pain in right knee: Secondary | ICD-10-CM

## 2015-10-05 DIAGNOSIS — E119 Type 2 diabetes mellitus without complications: Secondary | ICD-10-CM | POA: Insufficient documentation

## 2015-10-05 DIAGNOSIS — Y998 Other external cause status: Secondary | ICD-10-CM | POA: Insufficient documentation

## 2015-10-05 DIAGNOSIS — Y9389 Activity, other specified: Secondary | ICD-10-CM | POA: Diagnosis not present

## 2015-10-05 DIAGNOSIS — S8991XA Unspecified injury of right lower leg, initial encounter: Secondary | ICD-10-CM | POA: Insufficient documentation

## 2015-10-05 DIAGNOSIS — Y92410 Unspecified street and highway as the place of occurrence of the external cause: Secondary | ICD-10-CM | POA: Insufficient documentation

## 2015-10-05 MED ORDER — HYDROCODONE-ACETAMINOPHEN 5-325 MG PO TABS
1.0000 | ORAL_TABLET | Freq: Once | ORAL | Status: AC
  Administered 2015-10-05: 1 via ORAL

## 2015-10-05 MED ORDER — HYDROCODONE-ACETAMINOPHEN 5-325 MG PO TABS
ORAL_TABLET | ORAL | Status: AC
  Administered 2015-10-05: 1 via ORAL
  Filled 2015-10-05: qty 1

## 2015-10-05 MED ORDER — HYDROCODONE-ACETAMINOPHEN 5-325 MG PO TABS: 1.0000 | ORAL_TABLET | ORAL | Status: DC | PRN
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
## 2015-10-05 NOTE — ED Notes (Signed)
Reviewed d/c instructions, prescriptions, follow-up care, and use of ice/elevation. Pt verbalized understanding.
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
## 2015-10-05 NOTE — Discharge Instructions (Signed)
Cryotherapy Cryotherapy is when you put ice on your injury. Ice helps lessen pain and puffiness (swelling) after an injury. Ice works the best when you start using it in the first 24 to 48 hours after an injury. HOME CARE  Put a dry or damp towel between the ice pack and your skin.  You may press gently on the ice pack.  Leave the ice on for no more than 10 to 20 minutes at a time.  Check your skin after 5 minutes to make sure your skin is okay.  Rest at least 20 minutes between ice pack uses.  Stop using ice when your skin loses feeling (numbness).  Do not use ice on someone who cannot tell you when it hurts. This includes small children and people with memory problems (dementia). GET HELP RIGHT AWAY IF:  You have white spots on your skin.  Your skin turns blue or pale.  Your skin feels waxy or hard.  Your puffiness gets worse. MAKE SURE YOU:   Understand these instructions.  Will watch your condition.  Will get help right away if you are not doing well or get worse.   This information is not intended to replace advice given to you by your health care provider. Make sure you discuss any questions you have with your health care provider.   Document Released: 12/10/2007 Document Revised: 09/15/2011 Document Reviewed: 02/13/2011 Elsevier Interactive Patient Education 2016 [place] to your right knee as needed for pain and swelling. Elevate to decrease swelling. Use Ace wrap for support. Norco as needed for severe pain and begin taking ibuprofen with food for pain and inflammation. Follow-up with your primary care doctor if any continued problems.
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
## 2015-11-04 ENCOUNTER — Encounter: Payer: Self-pay | Admitting: Emergency Medicine

## 2015-11-04 ENCOUNTER — Emergency Department: Payer: BLUE CROSS/BLUE SHIELD

## 2015-11-04 ENCOUNTER — Inpatient Hospital Stay
Admission: EM | Admit: 2015-11-04 | Discharge: 2015-11-06 | DRG: 193 | Disposition: A | Discharge status: Home / Self Care | Payer: BLUE CROSS/BLUE SHIELD | Attending: Internal Medicine | Admitting: Internal Medicine

## 2015-11-04 ENCOUNTER — Inpatient Hospital Stay (HOSPITAL_COMMUNITY)
Admit: 2015-11-04 | Discharge: 2015-11-04 | Disposition: A | Discharge status: Home / Self Care | Payer: BLUE CROSS/BLUE SHIELD | Attending: Internal Medicine | Admitting: Internal Medicine

## 2015-11-04 DIAGNOSIS — K219 Gastro-esophageal reflux disease without esophagitis: Secondary | ICD-10-CM | POA: Diagnosis present

## 2015-11-04 DIAGNOSIS — M1990 Unspecified osteoarthritis, unspecified site: Secondary | ICD-10-CM | POA: Diagnosis present

## 2015-11-04 DIAGNOSIS — Z803 Family history of malignant neoplasm of breast: Secondary | ICD-10-CM

## 2015-11-04 DIAGNOSIS — Z7984 Long term (current) use of oral hypoglycemic drugs: Secondary | ICD-10-CM | POA: Diagnosis not present

## 2015-11-04 DIAGNOSIS — R0902 Hypoxemia: Secondary | ICD-10-CM

## 2015-11-04 DIAGNOSIS — J9601 Acute respiratory failure with hypoxia: Secondary | ICD-10-CM | POA: Diagnosis present

## 2015-11-04 DIAGNOSIS — E1122 Type 2 diabetes mellitus with diabetic chronic kidney disease: Secondary | ICD-10-CM | POA: Diagnosis present

## 2015-11-04 DIAGNOSIS — Z833 Family history of diabetes mellitus: Secondary | ICD-10-CM

## 2015-11-04 DIAGNOSIS — Z79899 Other long term (current) drug therapy: Secondary | ICD-10-CM

## 2015-11-04 DIAGNOSIS — J189 Pneumonia, unspecified organism: Secondary | ICD-10-CM

## 2015-11-04 DIAGNOSIS — N170 Acute kidney failure with tubular necrosis: Secondary | ICD-10-CM | POA: Diagnosis present

## 2015-11-04 DIAGNOSIS — E039 Hypothyroidism, unspecified: Secondary | ICD-10-CM | POA: Diagnosis present

## 2015-11-04 DIAGNOSIS — N182 Chronic kidney disease, stage 2 (mild): Secondary | ICD-10-CM | POA: Diagnosis present

## 2015-11-04 DIAGNOSIS — R0600 Dyspnea, unspecified: Secondary | ICD-10-CM | POA: Diagnosis not present

## 2015-11-04 DIAGNOSIS — G629 Polyneuropathy, unspecified: Secondary | ICD-10-CM | POA: Diagnosis present

## 2015-11-04 LAB — GLUCOSE, CAPILLARY
GLUCOSE-CAPILLARY: 167 mg/dL — AB (ref 65–99)
GLUCOSE-CAPILLARY: 337 mg/dL — AB (ref 65–99)
Glucose-Capillary: 34 mg/dL — CL (ref 65–99)

## 2015-11-04 LAB — RAPID INFLUENZA A&B ANTIGENS (ARMC ONLY)
INFLUENZA A (ARMC): NEGATIVE
INFLUENZA B (ARMC): NEGATIVE

## 2015-11-04 LAB — BASIC METABOLIC PANEL
Anion gap: 14 (ref 5–15)
BUN: 38 mg/dL — AB (ref 6–20)
CALCIUM: 9.4 mg/dL (ref 8.9–10.3)
CO2: 21 mmol/L — ABNORMAL LOW (ref 22–32)
CREATININE: 1.75 mg/dL — AB (ref 0.44–1.00)
Chloride: 104 mmol/L (ref 101–111)
GFR calc non Af Amer: 31 mL/min — ABNORMAL LOW (ref >60)
GFR, EST AFRICAN AMERICAN: 36 mL/min — AB (ref >60)
Glucose, Bld: 130 mg/dL — ABNORMAL HIGH (ref 65–99)
Potassium: 3.6 mmol/L (ref 3.5–5.1)
SODIUM: 139 mmol/L (ref 135–145)

## 2015-11-04 LAB — CBC
HCT: 42.7 % (ref 35.0–47.0)
Hemoglobin: 14.4 g/dL (ref 12.0–16.0)
MCH: 28.1 pg (ref 26.0–34.0)
MCHC: 33.7 g/dL (ref 32.0–36.0)
MCV: 83.5 fL (ref 80.0–100.0)
PLATELETS: 141 10*3/uL — AB (ref 150–440)
RBC: 5.12 MIL/uL (ref 3.80–5.20)
RDW: 14.1 % (ref 11.5–14.5)
WBC: 5.9 10*3/uL (ref 3.6–11.0)

## 2015-11-04 LAB — ECHOCARDIOGRAM COMPLETE
HEIGHTINCHES: 67 in
Weight: 2288 oz

## 2015-11-04 LAB — TROPONIN I

## 2015-11-04 MED ORDER — DEXTROSE 5 % IV SOLN
1.0000 g | Freq: Once | INTRAVENOUS | Status: AC
  Administered 2015-11-04: 1 g via INTRAVENOUS
  Filled 2015-11-04: qty 10

## 2015-11-04 MED ORDER — GABAPENTIN 100 MG PO CAPS
100.0000 mg | ORAL_CAPSULE | Freq: Two times a day (BID) | ORAL | Status: DC
  Administered 2015-11-04 – 2015-11-06 (×5): 100 mg via ORAL
  Filled 2015-11-04 (×7): qty 1

## 2015-11-04 MED ORDER — GLIPIZIDE 5 MG PO TABS
5.0000 mg | ORAL_TABLET | Freq: Every day | ORAL | Status: DC
  Administered 2015-11-04: 5 mg via ORAL
  Filled 2015-11-04: qty 1

## 2015-11-04 MED ORDER — DEXTROSE 50 % IV SOLN
INTRAVENOUS | Status: AC
  Administered 2015-11-04: 50 mL
  Filled 2015-11-04: qty 50

## 2015-11-04 MED ORDER — DIAZEPAM 5 MG PO TABS: 5.0000 mg | ORAL_TABLET | Freq: Three times a day (TID) | ORAL | Status: DC | PRN

## 2015-11-04 MED ORDER — HYDROCODONE-ACETAMINOPHEN 5-325 MG PO TABS
ORAL_TABLET | ORAL | Status: AC
  Administered 2015-11-04: 1 via ORAL
  Filled 2015-11-04: qty 1

## 2015-11-04 MED ORDER — ACETAMINOPHEN 650 MG RE SUPP: 650.0000 mg | Freq: Four times a day (QID) | RECTAL | Status: DC | PRN

## 2015-11-04 MED ORDER — TRAZODONE HCL 50 MG PO TABS: 25.0000 mg | ORAL_TABLET | Freq: Every evening | ORAL | Status: DC | PRN

## 2015-11-04 MED ORDER — LEVOTHYROXINE SODIUM 50 MCG PO TABS
50.0000 ug | ORAL_TABLET | Freq: Every day | ORAL | Status: DC
  Administered 2015-11-05 – 2015-11-06 (×2): 50 ug via ORAL
  Filled 2015-11-04 (×2): qty 1

## 2015-11-04 MED ORDER — DEXTROSE 50 % IV SOLN: 25.0000 g | Freq: Once | INTRAVENOUS | Status: DC

## 2015-11-04 MED ORDER — SODIUM CHLORIDE 0.9 % IV SOLN
INTRAVENOUS | Status: DC
  Administered 2015-11-04: 14:00:00 via INTRAVENOUS

## 2015-11-04 MED ORDER — SODIUM CHLORIDE 0.9 % IV BOLUS (SEPSIS)
1000.0000 mL | Freq: Once | INTRAVENOUS | Status: AC
  Administered 2015-11-04: 1000 mL via INTRAVENOUS

## 2015-11-04 MED ORDER — CETYLPYRIDINIUM CHLORIDE 0.05 % MT LIQD
7.0000 mL | Freq: Two times a day (BID) | OROMUCOSAL | Status: DC
  Administered 2015-11-04 – 2015-11-06 (×3): 7 mL via OROMUCOSAL

## 2015-11-04 MED ORDER — ACETAMINOPHEN 325 MG PO TABS: 650.0000 mg | ORAL_TABLET | Freq: Four times a day (QID) | ORAL | Status: DC | PRN

## 2015-11-04 MED ORDER — BISACODYL 10 MG RE SUPP: 10.0000 mg | Freq: Once | RECTAL | Status: DC

## 2015-11-04 MED ORDER — IPRATROPIUM-ALBUTEROL 0.5-2.5 (3) MG/3ML IN SOLN
3.0000 mL | Freq: Four times a day (QID) | RESPIRATORY_TRACT | Status: DC
  Administered 2015-11-04 – 2015-11-06 (×7): 3 mL via RESPIRATORY_TRACT
  Filled 2015-11-04 (×8): qty 3

## 2015-11-04 MED ORDER — HEPARIN SODIUM (PORCINE) 5000 UNIT/ML IJ SOLN
5000.0000 [IU] | Freq: Three times a day (TID) | INTRAMUSCULAR | Status: DC
  Administered 2015-11-04 – 2015-11-05 (×3): 5000 [IU] via SUBCUTANEOUS
  Filled 2015-11-04 (×3): qty 1

## 2015-11-04 MED ORDER — ONDANSETRON HCL 4 MG/2ML IJ SOLN: 4.0000 mg | Freq: Four times a day (QID) | INTRAMUSCULAR | Status: DC | PRN

## 2015-11-04 MED ORDER — DEXTROSE 5 % IV SOLN
500.0000 mg | Freq: Once | INTRAVENOUS | Status: AC
  Administered 2015-11-04: 500 mg via INTRAVENOUS
  Filled 2015-11-04: qty 500

## 2015-11-04 MED ORDER — PNEUMOCOCCAL VAC POLYVALENT 25 MCG/0.5ML IJ INJ
0.5000 mL | INJECTION | INTRAMUSCULAR | Status: AC
  Administered 2015-11-06: 0.5 mL via INTRAMUSCULAR
  Filled 2015-11-04 (×2): qty 0.5

## 2015-11-04 MED ORDER — ONDANSETRON HCL 4 MG/2ML IJ SOLN
4.0000 mg | Freq: Once | INTRAMUSCULAR | Status: AC
  Administered 2015-11-04: 4 mg via INTRAVENOUS

## 2015-11-04 MED ORDER — DEXTROSE 5 % IV SOLN
1.0000 g | INTRAVENOUS | Status: DC
  Administered 2015-11-04: 1 g via INTRAVENOUS
  Filled 2015-11-04 (×2): qty 10

## 2015-11-04 MED ORDER — ONDANSETRON HCL 4 MG/2ML IJ SOLN
INTRAMUSCULAR | Status: AC
  Administered 2015-11-04: 4 mg via INTRAVENOUS
  Filled 2015-11-04: qty 2

## 2015-11-04 MED ORDER — ONDANSETRON HCL 4 MG PO TABS: 4.0000 mg | ORAL_TABLET | Freq: Four times a day (QID) | ORAL | Status: DC | PRN

## 2015-11-04 MED ORDER — DOCUSATE SODIUM 100 MG PO CAPS
100.0000 mg | ORAL_CAPSULE | Freq: Two times a day (BID) | ORAL | Status: DC
  Administered 2015-11-04 – 2015-11-06 (×4): 100 mg via ORAL
  Filled 2015-11-04 (×5): qty 1

## 2015-11-04 MED ORDER — BISACODYL 5 MG PO TBEC: 5.0000 mg | DELAYED_RELEASE_TABLET | Freq: Every day | ORAL | Status: DC | PRN

## 2015-11-04 MED ORDER — DEXTROSE 5 % IV SOLN
500.0000 mg | INTRAVENOUS | Status: DC
  Administered 2015-11-04: 500 mg via INTRAVENOUS
  Filled 2015-11-04: qty 500

## 2015-11-04 MED ORDER — HYDROCOD POLST-CPM POLST ER 10-8 MG/5ML PO SUER
5.0000 mL | Freq: Two times a day (BID) | ORAL | Status: DC
  Administered 2015-11-04 – 2015-11-06 (×5): 5 mL via ORAL
  Filled 2015-11-04 (×5): qty 5

## 2015-11-04 MED ORDER — HYDROCODONE-ACETAMINOPHEN 5-325 MG PO TABS
1.0000 | ORAL_TABLET | Freq: Once | ORAL | Status: AC
  Administered 2015-11-04: 1 via ORAL

## 2015-11-04 NOTE — ED Provider Notes (Signed)
[facility] Emergency Department Provider Note   ____________________________________________  Time seen: Approximately 11:21 AM  I have reviewed the triage vital signs and the nursing notes.   HISTORY  Chief Complaint Weakness; Chest Pain; and Cough    HPI [name] is a 60 y.o. female with diabetes and arthritis who presents for evaluation of 2 days of productive cough, chest pain with cough, generalized weakness, nonbloody diarrhea, gradual onset, constant since onset, no modifying factors. She tried to go to work today at [employer] and they called EMS because she was feeling so weak. She denies fevers but she has had chills. No abdominal pain, nausea or vomiting. No dysuria.   Past Medical History  Diagnosis Date  . Diabetes mellitus without complication ([place])   . Arthritis     There are no active problems to display for this patient.   History reviewed. No pertinent past surgical history.  Current Outpatient Rx  Name  Route  Sig  Dispense  Refill  . diazepam (VALIUM) 5 MG tablet   Oral   Take 1 tablet (5 mg total) by mouth every 8 (eight) hours as needed for anxiety.   20 tablet   0   . HYDROcodone-acetaminophen (NORCO/VICODIN) 5-325 MG tablet   Oral   Take 1 tablet by mouth every 4 (four) hours as needed for moderate pain.   20 tablet   0   . ibuprofen (ADVIL,MOTRIN) 800 MG tablet   Oral   Take 1 tablet (800 mg total) by mouth every 8 (eight) hours as needed.   30 tablet   0   . ibuprofen (ADVIL,MOTRIN) 800 MG tablet   Oral   Take 1 tablet (800 mg total) by mouth every 8 (eight) hours as needed.   15 tablet   0   . metFORMIN (GLUMETZA) 500 MG (MOD) 24 hr tablet   Oral   Take 500 mg by mouth daily with breakfast.         . sulfamethoxazole-trimethoprim (BACTRIM DS) 800-160 MG per tablet   Oral   Take 1 tablet by mouth 2 (two) times daily.   20 tablet   0     Allergies Review of patient's allergies indicates no  known allergies.  Family History  Problem Relation Age of Onset  . Breast cancer Maternal Aunt     Social History Social History  Substance Use Topics  . Smoking status: Never Smoker   . Smokeless tobacco: None  . Alcohol Use: No    Review of Systems Constitutional: No fever, +chills Eyes: No visual changes. ENT: No sore throat. Cardiovascular: +chest pain. Respiratory: +shortness of breath. Gastrointestinal: No abdominal pain.  No nausea, no vomiting.  + diarrhea.  No constipation. Genitourinary: Negative for dysuria. Musculoskeletal: Negative for back pain. Skin: Negative for rash. Neurological: Negative for headaches, focal weakness or numbness.  10-point ROS otherwise negative.  ____________________________________________   PHYSICAL EXAM:  VITAL SIGNS: ED Triage Vitals  Enc Vitals Group     BP 11/04/15 1022 95/70 mmHg     Pulse Rate 11/04/15 1022 103     Resp 11/04/15 1022 18     Temp 11/04/15 1022 97.9 F (36.6 C)     Temp Source 11/04/15 1022 Oral     SpO2 11/04/15 1022 93 %     Weight 11/04/15 1022 143 lb (64.864 kg)     Height 11/04/15 1022 5\' 7"  (1.702 m)     Head Cir --  Peak Flow --      Pain Score 11/04/15 1023 5     Pain Loc --      Pain Edu? --      Excl. in Ama? --     Constitutional: Alert and oriented. Ill appearing, coughing vigorously. Eyes: Conjunctivae are normal. PERRL. EOMI. Head: Atraumatic. Nose: No congestion/rhinnorhea. Mouth/Throat: Mucous membranes are dry.  Oropharynx non-erythematous. Neck: No stridor. Supple without meningismus. Cardiovascular: Tachycardic rate, regular rhythm. Grossly normal heart sounds.  Good peripheral circulation. Respiratory: Intermittent mild tachypnea, no increased work of breathing. Diminished breath sounds in the right lower lung fields. Gastrointestinal: Soft and nontender. No distention.  No CVA tenderness. Genitourinary: deferred Musculoskeletal: No lower extremity tenderness nor edema.   No joint effusions. Tenderness to the vision throughout the anterior chest wall. Neurologic:  Normal speech and language. No gross focal neurologic deficits are appreciated.  Skin:  Skin is warm, dry and intact. No rash noted. Psychiatric: Mood and affect are normal. Speech and behavior are normal.  ____________________________________________   LABS (all labs ordered are listed, but only abnormal results are displayed)  Labs Reviewed  CBC - Abnormal; Notable for the following:    Platelets 141 (*)    All other components within normal limits  BASIC METABOLIC PANEL - Abnormal; Notable for the following:    CO2 21 (*)    Glucose, Bld 130 (*)    BUN 38 (*)    Creatinine, Ser 1.75 (*)    GFR calc non Af Amer 31 (*)    GFR calc Af Amer 36 (*)    All other components within normal limits  CULTURE, BLOOD (ROUTINE X 2)  CULTURE, BLOOD (ROUTINE X 2)  RAPID INFLUENZA A&B ANTIGENS ([facility] ONLY)  TROPONIN I  URINALYSIS COMPLETEWITH MICROSCOPIC ([facility] ONLY)   ____________________________________________  EKG  ED ECG REPORT I, Joanne Gavel, the attending physician, personally viewed and interpreted this ECG.   Date: 11/04/2015  EKG Time: 10:16  Rate: 113  Rhythm: sinus tachycardic  Axis: normal  Intervals:right bundle branch block  ST&T Change: No acute ST elevation. Right bundle branch block is appreciated on previous EKGs.  ____________________________________________  RADIOLOGY  CXR  IMPRESSION: Right base opacity worrisome for pneumonia. ____________________________________________   PROCEDURES  Procedure(s) performed: None  Critical Care performed: No  ____________________________________________   INITIAL IMPRESSION / ASSESSMENT AND PLAN / ED COURSE  Pertinent labs & imaging results that were available during my care of the patient were reviewed by me and considered in my medical decision making (see chart for details).  [name] is a 60 y.o. female  with diabetes and arthritis who presents for evaluation of 2 days of productive cough, chest pain with cough, generalized weakness, nonbloody diarrhea. On exam, she appears ill, coughing frequently. Her oxygen oxygen saturation was 88% on room air while she was resting, this improved to 95% with 2 L of abdominal oxygen via nasal cannula. She has diminished breath sounds in the right lung base and chest x-ray confirms right sided opacity which is concerning for pneumonia. We'll treat with IV fluids, ceftriaxone and azithromycin. Negative troponin and I suspect her chest pain is muscl0skeletal in nature. CBC unremarkable with the exception of mild thrombocytopenia, platelet count 141. BMP notable for BUN and creatinine elevation, likely prerenal azotemia, continue IV fluids. Case discussed with the hospitalist, Dr. Vianne Bulls, for admission at 11:45 AM. ____________________________________________   FINAL CLINICAL IMPRESSION(S) / ED DIAGNOSES  Final diagnoses:  Hypoxia  CAP (community acquired pneumonia)  NEW MEDICATIONS STARTED DURING THIS VISIT:  New Prescriptions   No medications on file     Note:  This document was prepared using Dragon voice recognition software and may include unintentional dictation errors.    Joanne Gavel, MD 11/04/15 1150

## 2015-11-04 NOTE — H&P (Signed)
[facility] [place] NAME: [name]    MR#:  [id number]  DATE OF BIRTH:  1956-04-05  DATE OF ADMISSION:  11/04/2015  PRIMARY CARE PHYSICIAN: Maryland Pink, MD   REQUESTING/REFERRING PHYSICIAN: Dr. Brunilda Payor gayle  CHIEF COMPLAINT: Cough, generalized weakness.    Chief Complaint  Patient presents with  . Weakness  . Chest Pain  . Cough    HISTORY OF PRESENT ILLNESS:  [name]  is a 60 y.o. female with a known history of diabetes mellitus type 2, hypothyroidism, arthritis, brought in because of generalized weakness, cough for the past 2 days. Patient noted to have cough and green phlegm for 2 days associated with chest pain due to cough. Has mild shortness of breath, hypoxia with oxygen saturation 88% on room air in the emergency room, chest x-ray showing pneumonia in the right lower lobe. Patient noted to have diarrhea this morning associated with weakness. Decreased by mouth intake for the past 2 days. No nausea or vomiting.  PAST MEDICAL HISTORY:   Past Medical History  Diagnosis Date  . Diabetes mellitus without complication ([place])   . Arthritis     PAST SURGICAL HISTOIRY:  History reviewed. No pertinent past surgical history. Surgical history significant for tubal ligation.  Social History  Substance Use Topics  . Smoking status: Never Smoker   . Smokeless tobacco: Not on file  . Alcohol Use: No    FAMILY HISTORY:   Family History  Problem Relation Age of Onset  . Breast cancer Maternal Aunt    father has diabetes, sister also has diabetes.  DRUG ALLERGIES:  No Known Allergies  REVIEW OF SYSTEMS:  CONSTITUTIONAL:Patient complains of lot of fatigue. S: No blurred or double vision.  EARS, NOSE, AND THROAT: No tinnitus or ear pain.  RESPIRATORY Cough, shortness of breath, pleuritic chest pain for last 2 days. Cardiovascular: : has  pleuritic chest pain.  orthopnea, edema.  GASTROINTESTINAL: Has  nausea, diarrhea. No appetite as well. : No polyuria, nocturia,  HEMATOLOGY: No anemia, easy bruising or bleeding SKIN: No rash or lesion. MUSCULOSKELETAL: No joint pain or arthritis.   NEUROLOGIC: No tingling, numbness, weakness.  PSYCHIATRY: No anxiety or depression.   MEDICATIONS AT HOME:   Prior to Admission medications   Medication Sig Start Date End Date Taking? Authorizing Provider  gabapentin (NEURONTIN) 100 MG capsule Take 100 mg by mouth 2 (two) times daily. 08/16/15  Yes Historical Provider, MD  glipiZIDE (GLUCOTROL) 5 MG tablet Take 5 mg by mouth daily. 08/15/15  Yes Historical Provider, MD  levothyroxine (SYNTHROID, LEVOTHROID) 50 MCG tablet Take 50 mcg by mouth daily before breakfast. Take 30 to 60 minutes before meal. 08/31/15  Yes Historical Provider, MD  metFORMIN (GLUCOPHAGE-XR) 500 MG 24 hr tablet Take 1,000 mg by mouth daily. 08/31/15  Yes Historical Provider, MD  diazepam (VALIUM) 5 MG tablet Take 1 tablet (5 mg total) by mouth every 8 (eight) hours as needed for anxiety. 03/03/15 03/02/16  Earleen Newport, MD  HYDROcodone-acetaminophen (NORCO/VICODIN) 5-325 MG tablet Take 1 tablet by mouth every 4 (four) hours as needed for moderate pain. 10/05/15   Johnn Hai, PA-C  ibuprofen (ADVIL,MOTRIN) 800 MG tablet Take 1 tablet (800 mg total) by mouth every 8 (eight) hours as needed. 11/11/14   III William C Ruffian, PA-C  ibuprofen (ADVIL,MOTRIN) 800 MG tablet Take 1 tablet (800 mg total) by mouth every 8 (eight) hours as needed. 08/11/15   [name],  PA-C  sulfamethoxazole-trimethoprim (BACTRIM DS) 800-160 MG per tablet Take 1 tablet by mouth 2 (two) times daily. 03/03/15   Earleen Newport, MD      VITAL SIGNS:  Blood pressure 104/66, pulse 93, temperature 97.9 F (36.6 C), temperature source Oral, resp. rate 15, height 5\' 7"  (1.702 m), weight 64.864 kg (143 lb), SpO2 88 %.  PHYSICAL EXAMINATION:  GENERAL:  60 y.o.-year-old patient lying in the bed with no acute  distress.  EYES: Pupils equal, round, reactive to light and accommodation. No scleral icterus. Extraocular muscles intact.  HEENT: Head atraumatic, normocephalic. Oropharynx and nasopharynx clear.  NECK:  Supple, no jugular venous distention. No thyroid enlargement, no tenderness.  LUNGS:Bilateral expiratory wheeze in all lung fields no rales,rhonchi or crepitation. No use of accessory muscles of respiration.  CARDIOVASCULAR: S1, S2 normal. No murmurs, rubs, or gallops.  ABDOMEN: Soft, nontender, nondistended. Bowel sounds present. No organomegaly or mass.  EXTREMITIES: No pedal edema, cyanosis, or clubbing.  NEUROLOGIC: Cranial nerves II through XII are intact. Muscle strength 5/5 in all extremities. Sensation intact. Gait not checked.  PSYCHIATRIC: The patient is alert and oriented x 3.  SKIN: No obvious rash, lesion, or ulcer.   LABORATORY PANEL:   CBC  Recent Labs Lab 11/04/15 1015  WBC 5.9  HGB 14.4  HCT 42.7  PLT 141*   ------------------------------------------------------------------------------------------------------------------  Chemistries   Recent Labs Lab 11/04/15 1015  NA 139  K 3.6  CL 104  CO2 21*  GLUCOSE 130*  BUN 38*  CREATININE 1.75*  CALCIUM 9.4   ------------------------------------------------------------------------------------------------------------------  Cardiac Enzymes  Recent Labs Lab 11/04/15 1015  TROPONINI <0.03   ------------------------------------------------------------------------------------------------------------------  RADIOLOGY:  Dg Chest Portable 1 View  11/04/2015  CLINICAL DATA:  Patient presents with persistent cough for 2 days in generalize weakness. EXAM: PORTABLE CHEST 1 VIEW COMPARISON:  08/11/2015 FINDINGS: Normal heart size. There is no pleural effusion or edema. Airspace opacity within the right base is noted, new from previous exam. Left lung is clear. IMPRESSION: Right base opacity worrisome for pneumonia.  Electronically Signed   By: Kerby Moors M.D.   On: 11/04/2015 11:20    EKG:   Orders placed or performed during the hospital encounter of 11/04/15  . EKG 12-Lead  . EKG 12-Lead  . EKG 12-Lead  . EKG 12-Lead  Sinus tachycardia 1 13 bpm. T wave inversions in lead V3, V4, V5.  IMPRESSION AND PLAN:   #60 year old female with diabetes with productive cough for 2 days and hypoxia on room air with 88% saturation has evidence of right sided pneumonia: Patient is admitted to hospitalist service for community-acquired pneumonia, continue oxygen to keep sats more than 95%, started on IV Rocephin, Zithromax. Patient does have wheezing in the lungs was started on a dual nebs every 6 hours, Tussionex for the cough. #2 acute renal failure due to ATN due to poor by mouth intake for last 2 days. Hold the metformin, started on IV fluids. She has only single kidney,congenitally. Has acute on chronic renal failure, with chronic kidney disease stage 2,baseline creatinine found 1.35. #3./Hypothyroidism continue Synthroid. #4 diabetes mellitus type 2: The metformin because of renal failure, use a sliding scale with coverage, continue glipizide.  #5 history of neuropathy: Patient takes Neurontin, Valium #6 GERD continue PPIs #7 EKG changes. Inversions in V3 and V4 V5 and V6 these are new changes compared to the EKG from February 2017. Check echocardiogram. Troponins are negative. DVT prophylaxis.  All the records are reviewed and case  discussed with ED provider. Management plans discussed with the patient, family and they are in agreement.  CODE STATUS:Full  TOTAL TIME TAKING CARE OF THIS PATIENT: 55  minutes.    Epifanio Lesches M.D on 11/04/2015 at 12:33 PM  Between 7am to 6pm - Pager - [phone]  After 6pm go to [email or web address] - password [email or web address] [facility]  Office  [phone]  CC: Primary care physician; Maryland Pink, MD  Note: This dictation was prepared with  Dragon dictation along with smaller phrase technology. Any transcriptional errors that result from this process are unintentional.

## 2015-11-04 NOTE — Progress Notes (Signed)
Pt states she didn't "feel good", pt pale, cool and clammy and becoming more lethargic, rapid called, FSBS 34. 1 amp of dextrose given recheck fsbs 337. VSS. pt more alert and responsive. will cont to monitor

## 2015-11-04 NOTE — ED Notes (Signed)
Patient from [employer]) via [employer]. EMS was activated by [facility] management due to patient's persistent cough for 2 days with generalized weakness. Patient also c/o chest pain to palpation + with coughing

## 2015-11-05 LAB — URINALYSIS COMPLETE WITH MICROSCOPIC (ARMC ONLY)
BILIRUBIN URINE: NEGATIVE
Glucose, UA: 50 mg/dL — AB
KETONES UR: NEGATIVE mg/dL
Nitrite: NEGATIVE
PH: 5 (ref 5.0–8.0)
PROTEIN: 30 mg/dL — AB
SPECIFIC GRAVITY, URINE: 1.017 (ref 1.005–1.030)

## 2015-11-05 LAB — BASIC METABOLIC PANEL
ANION GAP: 9 (ref 5–15)
BUN: 39 mg/dL — ABNORMAL HIGH (ref 6–20)
CHLORIDE: 108 mmol/L (ref 101–111)
CO2: 23 mmol/L (ref 22–32)
CREATININE: 1.61 mg/dL — AB (ref 0.44–1.00)
Calcium: 7.9 mg/dL — ABNORMAL LOW (ref 8.9–10.3)
GFR calc non Af Amer: 34 mL/min — ABNORMAL LOW (ref >60)
GFR, EST AFRICAN AMERICAN: 40 mL/min — AB (ref >60)
Glucose, Bld: 74 mg/dL (ref 65–99)
POTASSIUM: 3.7 mmol/L (ref 3.5–5.1)
SODIUM: 140 mmol/L (ref 135–145)

## 2015-11-05 LAB — CBC
HCT: 33.4 % — ABNORMAL LOW (ref 35.0–47.0)
Hemoglobin: 11.2 g/dL — ABNORMAL LOW (ref 12.0–16.0)
MCH: 28.7 pg (ref 26.0–34.0)
MCHC: 33.6 g/dL (ref 32.0–36.0)
MCV: 85.3 fL (ref 80.0–100.0)
PLATELETS: 117 10*3/uL — AB (ref 150–440)
RBC: 3.91 MIL/uL (ref 3.80–5.20)
RDW: 14.3 % (ref 11.5–14.5)
WBC: 5.4 10*3/uL (ref 3.6–11.0)

## 2015-11-05 LAB — EXPECTORATED SPUTUM ASSESSMENT W REFEX TO RESP CULTURE: SPECIAL REQUESTS: NORMAL

## 2015-11-05 LAB — GLUCOSE, CAPILLARY
GLUCOSE-CAPILLARY: 135 mg/dL — AB (ref 65–99)
GLUCOSE-CAPILLARY: 104 mg/dL — AB (ref 65–99)
Glucose-Capillary: 69 mg/dL (ref 65–99)
Glucose-Capillary: 102 mg/dL — ABNORMAL HIGH (ref 65–99)

## 2015-11-05 MED ORDER — DEXTROSE 5 % IV SOLN
500.0000 mg | INTRAVENOUS | Status: DC
  Administered 2015-11-05: 500 mg via INTRAVENOUS
  Filled 2015-11-05 (×2): qty 500

## 2015-11-05 MED ORDER — INSULIN ASPART 100 UNIT/ML ~~LOC~~ SOLN
0.0000 [IU] | Freq: Three times a day (TID) | SUBCUTANEOUS | Status: DC
  Administered 2015-11-05: 1 [IU] via SUBCUTANEOUS
  Filled 2015-11-05: qty 1

## 2015-11-05 MED ORDER — DEXTROSE 5 % IV SOLN
1.0000 g | INTRAVENOUS | Status: DC
  Filled 2015-11-05: qty 10

## 2015-11-05 MED ORDER — INSULIN ASPART 100 UNIT/ML ~~LOC~~ SOLN: 0.0000 [IU] | Freq: Every day | SUBCUTANEOUS | Status: DC

## 2015-11-05 MED ORDER — ENOXAPARIN SODIUM 40 MG/0.4ML ~~LOC~~ SOLN
40.0000 mg | SUBCUTANEOUS | Status: DC
  Administered 2015-11-05: 40 mg via SUBCUTANEOUS
  Filled 2015-11-05: qty 0.4

## 2015-11-05 MED ORDER — MENTHOL 3 MG MT LOZG
1.0000 | LOZENGE | OROMUCOSAL | Status: DC | PRN
  Filled 2015-11-05 (×2): qty 9

## 2015-11-05 MED ORDER — BENZONATATE 100 MG PO CAPS: 100.0000 mg | ORAL_CAPSULE | Freq: Three times a day (TID) | ORAL | Status: DC | PRN

## 2015-11-05 NOTE — Progress Notes (Signed)
FSBS this AM was 69. Pt states she "feels fine." Patient given orange juice and breakfast tray.

## 2015-11-05 NOTE — Progress Notes (Signed)
[place] [facility] [place] NAME: [name]    MR#:  [id number]  DATE OF BIRTH:  25-Sep-1955  SUBJECTIVE:   Patient is weak still with cough  REVIEW OF SYSTEMS:    Review of Systems  Constitutional: Negative for fever, chills and malaise/fatigue.  HENT: Negative for ear discharge, ear pain, hearing loss, nosebleeds and sore throat.   Eyes: Negative for blurred vision and pain.  Respiratory: Negative for cough, hemoptysis, shortness of breath and wheezing.   Cardiovascular: Negative for chest pain, palpitations and leg swelling.  Gastrointestinal: Negative for nausea, vomiting, abdominal pain, diarrhea and blood in stool.  Genitourinary: Negative for dysuria.  Musculoskeletal: Negative for back pain.  Neurological: Positive for weakness. Negative for dizziness, tremors, speech change, focal weakness, seizures and headaches.  Endo/Heme/Allergies: Does not bruise/bleed easily.  Psychiatric/Behavioral: Negative for depression, suicidal ideas and hallucinations.    Tolerating Diet:yes      DRUG ALLERGIES:  No Known Allergies  VITALS:  Blood pressure 100/52, pulse 75, temperature 97.5 F (36.4 C), temperature source Oral, resp. rate 20, height 5\' 7"  (1.702 m), weight 63.186 kg (139 lb 4.8 oz), SpO2 95 %.  PHYSICAL EXAMINATION:   Physical Exam    LABORATORY PANEL:   CBC  Recent Labs Lab 11/05/15 0531  WBC 5.4  HGB 11.2*  HCT 33.4*  PLT 117*   ------------------------------------------------------------------------------------------------------------------  Chemistries   Recent Labs Lab 11/05/15 0531  NA 140  K 3.7  CL 108  CO2 23  GLUCOSE 74  BUN 39*  CREATININE 1.61*  CALCIUM 7.9*   ------------------------------------------------------------------------------------------------------------------  Cardiac Enzymes  Recent Labs Lab 11/04/15 1015  TROPONINI <0.03    ------------------------------------------------------------------------------------------------------------------  RADIOLOGY:  Dg Chest Portable 1 View  11/04/2015  CLINICAL DATA:  Patient presents with persistent cough for 2 days in generalize weakness. EXAM: PORTABLE CHEST 1 VIEW COMPARISON:  08/11/2015 FINDINGS: Normal heart size. There is no pleural effusion or edema. Airspace opacity within the right base is noted, new from previous exam. Left lung is clear. IMPRESSION: Right base opacity worrisome for pneumonia. Electronically Signed   By: Kerby Moors M.D.   On: 11/04/2015 11:20     ASSESSMENT AND PLAN:   60 year old female with a history of diabetes and hypothyroidism who presents with cough and found to have community-acquired pneumonia.  1. Acute hypoxic respiratory failure due to community-acquired pneumonia: Wean oxygen as tolerated. Continue Levaquin. Blood cultures are negative today.  2. Acute kidney injury: This is due to poor by mouth intake. Hold nephrotoxic agents. Continue IV fluids. Follow creatinine.  3. Hypothyroid: Continue Synthroid.  4. Diabetes: Patient had low blood sugars. Hold oral medications. Continue sliding scale insulin. Diabetes educator consult.  5. EKG changes with inversion and lateral leads: Echocardiogram is normal with normal ejection fraction.Marland Kitchen   Physical therapy consult for disposition.  Management plans discussed with the patient and she is in agreement.  CODE STATUS: FULL  TOTAL TIME TAKING CARE OF THIS PATIENT: 30 minutes.     POSSIBLE D/C tomorrow, DEPENDING ON CLINICAL CONDITION.   Aubriauna Riner M.D on 11/05/2015 at 11:04 AM  Between 7am to 6pm - Pager - [phone] After 6pm go to [email or web address] - password [email or web address] [facility]  Office  [phone]  CC: Primary care physician; Maryland Pink, MD  Note: This dictation was prepared with Dragon dictation along with smaller phrase technology.  Any transcriptional errors that result from this process are unintentional.

## 2015-11-05 NOTE — Progress Notes (Signed)
Inpatient Diabetes Program Recommendations  AACE/ADA: New Consensus Statement on Inpatient Glycemic Control (2015)  Target Ranges:  Prepandial:   less than 140 mg/dL      Peak postprandial:   less than 180 mg/dL (1-2 hours)      Critically ill patients:  140 - 180 mg/dL   Review of Glycemic Control:  Results for MAISEE, KANAK (MRN [id number]) as of 11/05/2015 15:32  Ref. Range 11/04/2015 21:32 11/04/2015 21:37 11/04/2015 22:08 11/05/2015 07:43 11/05/2015 12:14  Glucose-Capillary Latest Ref Range: 65-99 mg/dL 34 (LL) 337 (H) 167 (H) 69 102 (H)    Diabetes history: Type 2 diabetes Outpatient Diabetes medications: Metformin 100 mg daily, Glipizide 5 mg daily Current orders for Inpatient glycemic control:  Novolog sensitive tid with meals and HS  Inpatient Diabetes Program Recommendations:    Spoke with patient regarding low blood sugar yesterday.  I asked if she has episodes like this at home.  She states yes.  When I asked what her blood sugars are with these episodes, she admits that she does not check them but plans to resume when she goes home.  She states that she has not been eating well at home but plans to do better.  She is concerned stating that she cannot afford to go to rehab.  RN in room and states she will discuss with case management.   Consider d/c of Glipizide off home medication regimen due to reports and actual lows in the hospital.  Patient will need to follow-up with PCP.    Thanks, Adah Perl, RN, BC-ADM Inpatient Diabetes Coordinator Pager [phone] (8a-5p)

## 2015-11-05 NOTE — Progress Notes (Signed)
Rapid response called for this patient. Rt not needed at this time. Low glucose levels per RN

## 2015-11-05 NOTE — Evaluation (Signed)
Physical Therapy Evaluation Patient Details Name: [name] MRN: [id number] DOB: 24-Sep-1955 Today's Date: 11/05/2015   History of Present Illness  Patient is a pleasant 60 y/o female that presents with generalized weakness, cough with green phlegm, found to have community acquired pneumonia.   Clinical Impression  Patient is a 60 y/o female that has been working prior to this admission for pneumonia. She appears quite weak and deconditioned throughout this session requiring prolonged time to complete bed mobility, transfers, and ambulation. She takes very minimal step lengths, with poor balance noted without RW.She does not appear to be at all near her baseline and is at very high risk of falling given her weakness and deconditioning. Would recommend SNF currently, though this will continue to be updated pending further PT sessions.     Follow Up Recommendations SNF    Equipment Recommendations  Rolling walker with 5" wheels    Recommendations for Other Services       Precautions / Restrictions Precautions Precautions: Fall Restrictions Weight Bearing Restrictions: No      Mobility  Bed Mobility Overal bed mobility: Modified Independent             General bed mobility comments: Patient able to use hand rails to transfer to EOB.   Transfers Overall transfer level: Modified independent Equipment used: Rolling walker (2 wheeled)             General transfer comment: Pt performs slowly, but no physical assistance required, PT observes patient is rather lethargic throughout and has some difficulty maintaining balance due to weakness.   Ambulation/Gait Ambulation/Gait assistance: Supervision Ambulation Distance (Feet): 12 Feet Assistive device: Rolling walker (2 wheeled) Gait Pattern/deviations: Step-to pattern;Narrow base of support;Trunk flexed;Decreased step length - right;Decreased step length - left   Gait velocity interpretation: Below normal speed for  age/gender General Gait Details: Patient ambulates with RW quite slowly with minimal step lengths. She requires multiple rest breaks and coughs at times throughout ambulation. She appears quite weak.  Stairs            Wheelchair Mobility    Modified Rankin (Stroke Patients Only)       Balance Overall balance assessment: Needs assistance Sitting-balance support: No upper extremity supported Sitting balance-Leahy Scale: Good     Standing balance support: Bilateral upper extremity supported Standing balance-Leahy Scale: Fair                               Pertinent Vitals/Pain Pain Assessment: No/denies pain    Home Living Family/patient expects to be discharged to:: Private residence Living Arrangements: Spouse/significant other;Children;Other relatives Available Help at Discharge: Available PRN/intermittently Type of Home: Apartment Home Access: Level entry     Home Layout: One level        Prior Function Level of Independence: Independent         Comments: Patient had been working at [employer]        Extremity/Trunk Assessment   Upper Extremity Assessment: Overall WFL for tasks assessed           Lower Extremity Assessment: Overall WFL for tasks assessed         Communication   Communication: No difficulties  Cognition Arousal/Alertness: Awake/alert Behavior During Therapy: WFL for tasks assessed/performed Overall Cognitive Status: Within Functional Limits for tasks assessed  General Comments      Exercises        Assessment/Plan    PT Assessment Patient needs continued PT services  PT Diagnosis Difficulty walking;Generalized weakness   PT Problem List Decreased strength;Decreased mobility;Decreased safety awareness;Decreased activity tolerance;Cardiopulmonary status limiting activity;Decreased balance  PT Treatment Interventions Gait training;Therapeutic  exercise;Therapeutic activities;DME instruction;Balance training   PT Goals (Current goals can be found in the Care Plan section) Acute Rehab PT Goals Patient Stated Goal: To return home  PT Goal Formulation: With patient Time For Goal Achievement: 11/19/15 Potential to Achieve Goals: Good    Frequency Min 2X/week   Barriers to discharge Decreased caregiver support Patient does not have 24/7 coverage at home for assistance needs.     Co-evaluation               End of Session Equipment Utilized During Treatment: Gait belt;Oxygen Activity Tolerance: Patient tolerated treatment well;Patient limited by fatigue Patient left: in chair;with chair alarm set;with call bell/phone within reach Nurse Communication: Mobility status         Time: [id number] PT Time Calculation (min) (ACUTE ONLY): 21 min   Charges:   PT Evaluation $PT Eval Moderate Complexity: 1 Procedure     PT G Codes:       Kerman Passey, PT, DPT    11/05/2015, 6:27 PM

## 2015-11-06 LAB — URINE CULTURE: SPECIAL REQUESTS: NORMAL

## 2015-11-06 LAB — GLUCOSE, CAPILLARY
GLUCOSE-CAPILLARY: 119 mg/dL — AB (ref 65–99)
Glucose-Capillary: 108 mg/dL — ABNORMAL HIGH (ref 65–99)

## 2015-11-06 MED ORDER — AZITHROMYCIN 250 MG PO TABS: 500.0000 mg | ORAL_TABLET | Freq: Every day | ORAL | Status: DC

## 2015-11-06 MED ORDER — LEVOFLOXACIN 750 MG PO TABS: 750.0000 mg | ORAL_TABLET | Freq: Every day | ORAL | Status: DC

## 2015-11-06 NOTE — Care Management (Signed)
Patient admitted for PNA and decreased O2 sats.  Patient has been weaned to RA.  Patient states that she lives at home with her husband, daughter, and grandson.  Patient is employed at the [employer] on [address] and utilizes the pharmacy there. Patient denies any issues with transportation of finances.  PT has assessed patient and recommend SNF.  Patient declined.  Patient is agreeable to home health.  Home health PT and RN has been ordered.  Patient was provided with agency list and [facility] was [facility].  Referral was made to [facility] with [facility].  RNCM signing off

## 2015-11-06 NOTE — Progress Notes (Signed)
Patient discharged to home, Follow up appointment with Dr. Kary Kos 11-13-2015 at 0915. Patient given pneumococcal vaccine prior to discharge. Patient ambulates with walker. No acute distress noted. Patient states that family will not be able to pick her up until 1700.

## 2015-11-06 NOTE — Clinical Social Work Note (Signed)
PT has seen patient and recommending [facility] however MD has spoken to patient and she wishes to proceed with returning home today with Home Health. RN CM has arranged this. Shela Leff MSW,LCSW [phone]

## 2015-11-06 NOTE — Care Management (Signed)
Order for rolling walker.  Jason with [facility].  To be delivered prior to discharge.

## 2015-11-06 NOTE — Progress Notes (Signed)
IV to PO Policy for Antibiotics  Patient with orders for Azithromycin 500 mg IV q24h. Patient meets below criteria for transition to PO per policy:  1.  Patient has White Blood Count < 11.1 x 103/mm3    2.  Patient has maintained temperature < 99.5 Fahrenheit for twenty-four hours 3.  Patient has eaten > 50% of meals for twenty-four hours 4.  Patient is taking other medications by mouth  Will order Azithromycin 500 mg po daily.   Murrell Converse, PharmD Clinical Pharmacist 11/06/2015

## 2015-11-06 NOTE — Discharge Summary (Signed)
[place] [facility] [place] NAME: [name]    MR#:  [id number]  DATE OF BIRTH:  Jul 18, 1955  DATE OF ADMISSION:  11/04/2015 ADMITTING PHYSICIAN: Epifanio Lesches, MD  DATE OF DISCHARGE: 11/06/2015  PRIMARY CARE PHYSICIAN: Maryland Pink, MD    ADMISSION DIAGNOSIS:  Hypoxia [R09.02] CAP (community acquired pneumonia) [J18.9]  DISCHARGE DIAGNOSIS:  Active Problems:   Acute respiratory failure with hypoxia ([place])   SECONDARY DIAGNOSIS:   Past Medical History  Diagnosis Date  . Diabetes mellitus without complication ([name])   . Arthritis     HOSPITAL COURSE:   60 year old female with a history of diabetes and hypothyroidism who presents with cough and found to have community-acquired pneumonia.  1. Acute hypoxic respiratory failure due to community-acquired pneumonia: Patient was treated with oxygen and Levaquin. She has been weaned off oxygen. She is doing well. She will require treatment for community-acquired pneumonia for a total 7 days.  2. Acute kidney injury: This is due to poor by mouth intake. Kidney function improved with IV fluids.  3. Hypothyroid: Continue Synthroid at discharge.  4. Diabetes: Patient will continue metformin at discharge however due to low blood sugars glipizide will be discontinued. Neck size she'll need a follow-up with her primary care physician for her diabetes. Patient was seen and evaluated by diabetes educator while in the hospital who had recommended to stop glipizide.   5. EKG changes with inversion and lateral leads: She had no acute changes on telemetry monitoring. Echocardiogram is normal with normal ejection fraction  DISCHARGE CONDITIONS AND DIET:   Patient is stable for discharge on a heart healthy diet/diabetic  CONSULTS OBTAINED:     DRUG ALLERGIES:  No Known Allergies  DISCHARGE MEDICATIONS:   Current Discharge Medication List    START taking these medications   Details   levofloxacin (LEVAQUIN) 750 MG tablet Take 1 tablet (750 mg total) by mouth daily. Qty: 5 tablet, Refills: 0      CONTINUE these medications which have NOT CHANGED   Details  gabapentin (NEURONTIN) 100 MG capsule Take 100 mg by mouth 2 (two) times daily.    levothyroxine (SYNTHROID, LEVOTHROID) 50 MCG tablet Take 50 mcg by mouth daily before breakfast. Take 30 to 60 minutes before meal.    metFORMIN (GLUCOPHAGE-XR) 500 MG 24 hr tablet Take 1,000 mg by mouth daily.    diazepam (VALIUM) 5 MG tablet Take 1 tablet (5 mg total) by mouth every 8 (eight) hours as needed for anxiety. Qty: 20 tablet, Refills: 0    HYDROcodone-acetaminophen (NORCO/VICODIN) 5-325 MG tablet Take 1 tablet by mouth every 4 (four) hours as needed for moderate pain. Qty: 20 tablet, Refills: 0    ibuprofen (ADVIL,MOTRIN) 800 MG tablet Take 1 tablet (800 mg total) by mouth every 8 (eight) hours as needed. Qty: 15 tablet, Refills: 0      STOP taking these medications     glipiZIDE (GLUCOTROL) 5 MG tablet      sulfamethoxazole-trimethoprim (BACTRIM DS) 800-160 MG per tablet               Today   CHIEF COMPLAINT:  Patient is doing well is moaning. Patient does not want to go to skilled nursing facility.  VITAL SIGNS:  Blood pressure 102/50, pulse 95, temperature 98.1 F (36.7 C), temperature source Oral, resp. rate 20, height 5\' 7"  (1.702 m), weight 61.598 kg (135 lb 12.8 oz), SpO2 94 %.   REVIEW OF SYSTEMS:  Review of Systems  Constitutional: Negative for fever, chills and malaise/fatigue.  HENT: Negative for ear discharge, ear pain, hearing loss, nosebleeds and sore throat.   Eyes: Negative for blurred vision and pain.  Respiratory: Positive for cough. Negative for hemoptysis, shortness of breath and wheezing.   Cardiovascular: Negative for chest pain, palpitations and leg swelling.  Gastrointestinal: Negative for nausea, vomiting, abdominal pain, diarrhea and blood in stool.  Genitourinary:  Negative for dysuria.  Musculoskeletal: Negative for back pain.  Neurological: Negative for dizziness, tremors, speech change, focal weakness, seizures and headaches.  Endo/Heme/Allergies: Does not bruise/bleed easily.  Psychiatric/Behavioral: Negative for depression, suicidal ideas and hallucinations.     PHYSICAL EXAMINATION:  GENERAL:  60 y.o.-year-old patient lying in the bed with no acute distress.  NECK:  Supple, no jugular venous distention. No thyroid enlargement, no tenderness.  LUNGS: Normal breath sounds bilaterally, no wheezing, rales,rhonchi  No use of accessory muscles of respiration.  CARDIOVASCULAR: S1, S2 normal. No murmurs, rubs, or gallops.  ABDOMEN: Soft, non-tender, non-distended. Bowel sounds present. No organomegaly or mass.  EXTREMITIES: No pedal edema, cyanosis, or clubbing.  PSYCHIATRIC: The patient is alert and oriented x 3.  SKIN: No obvious rash, lesion, or ulcer.   DATA REVIEW:   CBC  Recent Labs Lab 11/05/15 0531  WBC 5.4  HGB 11.2*  HCT 33.4*  PLT 117*    Chemistries   Recent Labs Lab 11/05/15 0531  NA 140  K 3.7  CL 108  CO2 23  GLUCOSE 74  BUN 39*  CREATININE 1.61*  CALCIUM 7.9*    Cardiac Enzymes  Recent Labs Lab 11/04/15 1015  TROPONINI <0.03    Microbiology Results  @MICRORSLT48 @  RADIOLOGY:  Dg Chest Portable 1 View  11/04/2015  CLINICAL DATA:  Patient presents with persistent cough for 2 days in generalize weakness. EXAM: PORTABLE CHEST 1 VIEW COMPARISON:  08/11/2015 FINDINGS: Normal heart size. There is no pleural effusion or edema. Airspace opacity within the right base is noted, new from previous exam. Left lung is clear. IMPRESSION: Right base opacity worrisome for pneumonia. Electronically Signed   By: Kerby Moors M.D.   On: 11/04/2015 11:20      Management plans discussed with the patient and she is in agreement. Stable for discharge  Patient did not want to go to skilled nursing facility as recommended  by physical therapy. She'll go home with home health care.  Patient should follow up with PCP in 1 week  CODE STATUS:     Code Status Orders        Start     Ordered   11/04/15 1217  Full code   Continuous     11/04/15 1219    Code Status History    Date Active Date Inactive Code Status Order ID Comments User Context   This patient has a current code status but no historical code status.      TOTAL TIME TAKING CARE OF THIS PATIENT: 35 minutes.    Note: This dictation was prepared with Dragon dictation along with smaller phrase technology. Any transcriptional errors that result from this process are unintentional.  Lecia Esperanza M.D on 11/06/2015 at 10:26 AM  Between 7am to 6pm - Pager - [phone] After 6pm go to [email or web address] - password [email or web address]  Office  [phone]  CC: Primary care physician; Maryland Pink, MD

## 2015-11-06 NOTE — Progress Notes (Signed)
Physical Therapy Treatment Patient Details Name: [name] MRN: [id number] DOB: 12-24-1955 Today's Date: 11/06/2015    History of Present Illness Patient is a pleasant 60 y/o female that presents with generalized weakness, cough with green phlegm, found to have community acquired pneumonia.     PT Comments    Pt in bed ready for session.  Supine to edge of bed with ease and use of rail.  Stood and was able to ambulate around the nursing unit with supervision.  Generally steady gait.  She ambulated to/from bathroom in room without assistive device with short staggering steps but no loss of balance.  Discussed with pt who stated she does not have an assistive device at home and agreed it would be safer until she was steadier on her feet.  Discussed with social worker who will follow up with order.  Pt is comfortable with discharge to home this afternoon.   Follow Up Recommendations  Home health PT     Equipment Recommendations  Rolling walker with 5" wheels    Recommendations for Other Services       Precautions / Restrictions Precautions Precautions: Fall Restrictions Weight Bearing Restrictions: No    Mobility  Bed Mobility Overal bed mobility: Modified Independent (rail)                Transfers Overall transfer level: Modified independent Equipment used: Rolling walker (2 wheeled)             General transfer comment: Pt performs slowly, but no physical assistance required, PT observes patient is rather lethargic throughout and has some difficulty maintaining balance due to weakness.   Ambulation/Gait Ambulation/Gait assistance: Supervision Ambulation Distance (Feet): 180 Feet Assistive device: Rolling walker (2 wheeled) Gait Pattern/deviations: Step-through pattern   Gait velocity interpretation: <1.8 ft/sec, indicative of risk for recurrent falls General Gait Details: Increased distances today.  Steady with rw.  No standing rest breaks taken  today   Stairs            Wheelchair Mobility    Modified Rankin (Stroke Patients Only)       Balance Overall balance assessment: Needs assistance Sitting-balance support: Feet supported Sitting balance-Leahy Scale: Good     Standing balance support: No upper extremity supported Standing balance-Leahy Scale: Good                      Cognition Arousal/Alertness: Awake/alert Behavior During Therapy: WFL for tasks assessed/performed Overall Cognitive Status: Within Functional Limits for tasks assessed                      Exercises      General Comments        Pertinent Vitals/Pain Pain Assessment: No/denies pain    Home Living                      Prior Function            PT Goals (current goals can now be found in the care plan section) Acute Rehab PT Goals Patient Stated Goal: To return home  Progress towards PT goals: Progressing toward goals    Frequency  Min 2X/week    PT Plan Current plan remains appropriate    Co-evaluation             End of Session Equipment Utilized During Treatment: Gait belt Activity Tolerance: Patient tolerated treatment well Patient left: in bed;with call bell/phone within reach;with bed alarm  set     Time: [id number] PT Time Calculation (min) (ACUTE ONLY): 17 min  Charges:  $Gait Training: 8-22 mins                    G Codes:      Chesley Noon, PTA 11/06/2015, 12:25 PM

## 2015-11-07 LAB — CULTURE, RESPIRATORY: SPECIAL REQUESTS: NORMAL

## 2015-11-07 LAB — CULTURE, RESPIRATORY W GRAM STAIN: Culture: NORMAL

## 2015-11-09 LAB — CULTURE, BLOOD (ROUTINE X 2)
Culture: NO GROWTH
Culture: NO GROWTH

## 2016-05-21 ENCOUNTER — Emergency Department: Payer: BLUE CROSS/BLUE SHIELD

## 2016-05-21 ENCOUNTER — Emergency Department
Admission: EM | Admit: 2016-05-21 | Discharge: 2016-05-21 | Disposition: A | Discharge status: Home / Self Care | Payer: BLUE CROSS/BLUE SHIELD | Attending: Emergency Medicine | Admitting: Emergency Medicine

## 2016-05-21 ENCOUNTER — Encounter: Payer: Self-pay | Admitting: Emergency Medicine

## 2016-05-21 DIAGNOSIS — Z7984 Long term (current) use of oral hypoglycemic drugs: Secondary | ICD-10-CM | POA: Insufficient documentation

## 2016-05-21 DIAGNOSIS — E119 Type 2 diabetes mellitus without complications: Secondary | ICD-10-CM | POA: Insufficient documentation

## 2016-05-21 DIAGNOSIS — E860 Dehydration: Secondary | ICD-10-CM | POA: Insufficient documentation

## 2016-05-21 DIAGNOSIS — J189 Pneumonia, unspecified organism: Secondary | ICD-10-CM | POA: Insufficient documentation

## 2016-05-21 DIAGNOSIS — R1084 Generalized abdominal pain: Secondary | ICD-10-CM | POA: Diagnosis present

## 2016-05-21 DIAGNOSIS — Z791 Long term (current) use of non-steroidal anti-inflammatories (NSAID): Secondary | ICD-10-CM | POA: Diagnosis not present

## 2016-05-21 DIAGNOSIS — J181 Lobar pneumonia, unspecified organism: Secondary | ICD-10-CM

## 2016-05-21 LAB — CBC WITH DIFFERENTIAL/PLATELET
Basophils Absolute: 0 10*3/uL (ref 0–0.1)
Basophils Relative: 1 %
EOS ABS: 0.1 10*3/uL (ref 0–0.7)
Eosinophils Relative: 2 %
HCT: 39 % (ref 35.0–47.0)
Hemoglobin: 13.3 g/dL (ref 12.0–16.0)
LYMPHS ABS: 2.6 10*3/uL (ref 1.0–3.6)
Lymphocytes Relative: 33 %
MCH: 28.9 pg (ref 26.0–34.0)
MCHC: 34.1 g/dL (ref 32.0–36.0)
MCV: 84.8 fL (ref 80.0–100.0)
MONOS PCT: 6 %
Monocytes Absolute: 0.5 10*3/uL (ref 0.2–0.9)
NEUTROS ABS: 4.6 10*3/uL (ref 1.4–6.5)
Neutrophils Relative %: 58 %
Platelets: 154 10*3/uL (ref 150–440)
RBC: 4.6 MIL/uL (ref 3.80–5.20)
RDW: 13.9 % (ref 11.5–14.5)
WBC: 7.9 10*3/uL (ref 3.6–11.0)

## 2016-05-21 LAB — URINALYSIS COMPLETE WITH MICROSCOPIC (ARMC ONLY)
BACTERIA UA: NONE SEEN
BILIRUBIN URINE: NEGATIVE
Glucose, UA: NEGATIVE mg/dL
HGB URINE DIPSTICK: NEGATIVE
Ketones, ur: NEGATIVE mg/dL
Leukocytes, UA: NEGATIVE
Nitrite: NEGATIVE
PH: 5 (ref 5.0–8.0)
PROTEIN: 100 mg/dL — AB
Specific Gravity, Urine: 1.018 (ref 1.005–1.030)
Squamous Epithelial / LPF: NONE SEEN

## 2016-05-21 LAB — COMPREHENSIVE METABOLIC PANEL
ALBUMIN: 4.5 g/dL (ref 3.5–5.0)
ALT: 28 U/L (ref 14–54)
ANION GAP: 9 (ref 5–15)
AST: 37 U/L (ref 15–41)
Alkaline Phosphatase: 108 U/L (ref 38–126)
BUN: 32 mg/dL — AB (ref 6–20)
CHLORIDE: 109 mmol/L (ref 101–111)
CO2: 23 mmol/L (ref 22–32)
Calcium: 9.5 mg/dL (ref 8.9–10.3)
Creatinine, Ser: 1.31 mg/dL — ABNORMAL HIGH (ref 0.44–1.00)
GFR calc Af Amer: 51 mL/min — ABNORMAL LOW (ref >60)
GFR calc non Af Amer: 44 mL/min — ABNORMAL LOW (ref >60)
GLUCOSE: 82 mg/dL (ref 65–99)
POTASSIUM: 5.1 mmol/L (ref 3.5–5.1)
SODIUM: 141 mmol/L (ref 135–145)
Total Bilirubin: 0.7 mg/dL (ref 0.3–1.2)
Total Protein: 8.6 g/dL — ABNORMAL HIGH (ref 6.5–8.1)

## 2016-05-21 LAB — ETHANOL: Alcohol, Ethyl (B): <5 mg/dL (ref <5)

## 2016-05-21 LAB — RAPID INFLUENZA A&B ANTIGENS (ARMC ONLY): INFLUENZA A (ARMC): NEGATIVE

## 2016-05-21 LAB — LIPASE, BLOOD: Lipase: 77 U/L — ABNORMAL HIGH (ref 11–51)

## 2016-05-21 LAB — RAPID INFLUENZA A&B ANTIGENS: Influenza B (ARMC): NEGATIVE

## 2016-05-21 MED ORDER — LEVOFLOXACIN 750 MG PO TABS: 750.0000 mg | ORAL_TABLET | Freq: Every day | ORAL | 0 refills | Status: DC

## 2016-05-21 MED ORDER — SODIUM CHLORIDE 0.9 % IV BOLUS (SEPSIS)
1000.0000 mL | Freq: Once | INTRAVENOUS | Status: AC
  Administered 2016-05-21: 1000 mL via INTRAVENOUS

## 2016-05-21 MED ORDER — LEVOFLOXACIN 750 MG PO TABS
750.0000 mg | ORAL_TABLET | Freq: Once | ORAL | Status: AC
  Administered 2016-05-21: 750 mg via ORAL
  Filled 2016-05-21: qty 1

## 2016-05-21 NOTE — ED Notes (Signed)
Lab called. Awaiting them to send up flu swabs.

## 2016-05-21 NOTE — ED Triage Notes (Signed)
Per [facility], patient comes from work after being found in the break room unresponsive. Staff laid patient on the floor. EMS states patient was 100 bmp on monitor NSR, CBG 75. Patient given ammonia inhalant. Now following commands and speaking.

## 2016-05-21 NOTE — ED Provider Notes (Addendum)
[facility] Emergency Department Provider Note  ____________________________________________  Time seen: Approximately 3:45 PM  I have reviewed the triage vital signs and the nursing notes.   HISTORY  Chief Complaint Abdominal pain and malaise   HPI [name] is a 60 y.o. female who is at work in the [employer] break room when she was found unresponsive in a chair. She was helped to the floor, and then EMS was called. When they arrived they noted that the patient actually was responding to them answering questions. They may know interventions during transport. The patient complains of generalized abdominal pain. She reports that she was in her usual state of health, eating and drinking normally, taking her medications, up until today around lunchtime when she suddenly felt ill. Denies vomiting diarrhea fevers or chills. No chest pain or shortness of breath. No headache or neck stiffness. She reports a history of pneumonia in April 2017.  Abdominal pain is moderate, constant, nonradiating, no aggravating or alleviating factors   Past Medical History:  Diagnosis Date  . Arthritis   . Diabetes mellitus without complication [facility])      Patient Active Problem List   Diagnosis Date Noted  . Acute respiratory failure with hypoxia ([place]) 11/04/2015     History reviewed. No pertinent surgical history.   Prior to Admission medications   Medication Sig Start Date End Date Taking? Authorizing Provider  gabapentin (NEURONTIN) 100 MG capsule Take 100 mg by mouth 2 (two) times daily. 08/16/15   Historical Provider, MD  HYDROcodone-acetaminophen (NORCO/VICODIN) 5-325 MG tablet Take 1 tablet by mouth every 4 (four) hours as needed for moderate pain. 10/05/15   Johnn Hai, PA-C  ibuprofen (ADVIL,MOTRIN) 800 MG tablet Take 1 tablet (800 mg total) by mouth every 8 (eight) hours as needed. 08/11/15   [name], PA-C  levofloxacin (LEVAQUIN) 750 MG tablet Take 1 tablet  (750 mg total) by mouth daily. 05/21/16   Carrie Mew, MD  levothyroxine (SYNTHROID, LEVOTHROID) 50 MCG tablet Take 50 mcg by mouth daily before breakfast. Take 30 to 60 minutes before meal. 08/31/15   Historical Provider, MD  metFORMIN (GLUCOPHAGE-XR) 500 MG 24 hr tablet Take 1,000 mg by mouth daily. 08/31/15   Historical Provider, MD     Allergies Patient has no known allergies.   Family History  Problem Relation Age of Onset  . Breast cancer Maternal Aunt     Social History Social History  Substance Use Topics  . Smoking status: Never Smoker  . Smokeless tobacco: Not on file  . Alcohol use No    Review of Systems  Constitutional:   No fever or chills.  ENT:   No sore throat. No rhinorrhea. Cardiovascular:   No chest pain. Respiratory:   No dyspnea or cough. Gastrointestinal:  Positive abdominal pain as above without vomiting and diarrhea.  Genitourinary:   Negative for dysuria or difficulty urinating. Musculoskeletal:   Negative for focal pain or swelling. Positive myalgia Neurological:   Negative for headaches 10-point ROS otherwise negative.  ____________________________________________   PHYSICAL EXAM:  VITAL SIGNS: ED Triage Vitals  Enc Vitals Group     BP 05/21/16 1533 (!) 156/84     Pulse Rate 05/21/16 1533 (!) 103     Resp 05/21/16 1533 13     Temp 05/21/16 1533 97.6 F (36.4 C)     Temp Source 05/21/16 1533 Oral     SpO2 05/21/16 1533 97 %     Weight 05/21/16 1531 140 lb (63.5  kg)     Height 05/21/16 1531 5\' 8"  (1.727 m)     Head Circumference --      Peak Flow --      Pain Score 05/21/16 1533 7     Pain Loc --      Pain Edu? --      Excl. in [place]? --     Vital signs reviewed, nursing assessments reviewed.   Constitutional:   Alert and oriented. Ill-appearing but not in distress. Eyes:   No scleral icterus. No conjunctival pallor. PERRL. EOMI.  No nystagmus. ENT   Head:   Normocephalic and atraumatic.   Nose:   No  congestion/rhinnorhea. No septal hematoma   Mouth/Throat:   Dry mucous membranes, no pharyngeal erythema. No peritonsillar mass.    Neck:   No stridor. No SubQ emphysema. No meningismus. Hematological/Lymphatic/Immunilogical:   No cervical lymphadenopathy. Cardiovascular:   Tachycardia heart rate 105. Symmetric bilateral radial and DP pulses.  No murmurs.  Respiratory:   Normal respiratory effort without tachypnea nor retractions. Diffuse expiratory wheezing. Bronchospastic coughing. No focal crackles or consolidation Gastrointestinal:   Soft without focal tenderness.. Non distended.  No rebound, rigidity, or guarding. Genitourinary:   deferred Musculoskeletal:   Nontender with normal range of motion in all extremities. No joint effusions.  No lower extremity tenderness.  No edema. Neurologic:   Normal speech and language.  CN 2-10 normal. Motor grossly intact. No gross focal neurologic deficits are appreciated.  Skin:    Skin is warm, dry and intact. No rash noted.  No petechiae, purpura, or bullae.  ____________________________________________    LABS (pertinent positives/negatives) (all labs ordered are listed, but only abnormal results are displayed) Labs Reviewed  COMPREHENSIVE METABOLIC PANEL - Abnormal; Notable for the following:       Result Value   BUN 32 (*)    Creatinine, Ser 1.31 (*)    Total Protein 8.6 (*)    GFR calc non Af Amer 44 (*)    GFR calc Af Amer 51 (*)    All other components within normal limits  LIPASE, BLOOD - Abnormal; Notable for the following:    Lipase 77 (*)    All other components within normal limits  URINALYSIS COMPLETEWITH MICROSCOPIC ([facility] ONLY) - Abnormal; Notable for the following:    Color, Urine YELLOW (*)    APPearance CLEAR (*)    Protein, ur 100 (*)    All other components within normal limits  RAPID INFLUENZA A&B ANTIGENS ([facility] ONLY)  URINE CULTURE  ETHANOL  CBC WITH DIFFERENTIAL/PLATELET    ____________________________________________   EKG  EKG interpreted by me Sinus rhythm rate of 92, right axis, right bundle branch block, no acute ischemic changes  ____________________________________________    RADIOLOGY  Chest x-ray shows small infiltrate in the left base.  ____________________________________________   PROCEDURES Procedures  ____________________________________________   INITIAL IMPRESSION / ASSESSMENT AND PLAN / ED COURSE  Pertinent labs & imaging results that were available during my care of the patient were reviewed by me and considered in my medical decision making (see chart for details).  Patient presents with malaise fatigue and generalized abdominal pain and wheezing. The patient is not clearly septic, we will begin workup with labs urinalysis chest x-ray EKG. We'll give IV fluids for hydration.  No evidence of anasarca or ascites or volume overload. No suspicion for ACS PE dissection AAA. The unifying exhalation of all her symptoms is that she most likely has a viral illness which are rampant  in the community right now.   ----------------------------------------- 6:02 PM on 05/21/2016 -----------------------------------------  Vital signs normalized after IV fluids. Patient calm and comfortable. Mental status or means appropriate. Workup negative except for some slight infiltrate in the left lower lung suggestive of a community-acquired pneumonia. Given her diabetes history we'll treat with Levaquin. I'll have her follow-up with her PCP who she reports she can call tomorrow. She states she is off work the next 2 days and so she can pick up her in about a prescription and see her doctor. Low suspicion of meningitis encephalitis or sepsis.    Clinical Course    ____________________________________________   FINAL CLINICAL IMPRESSION(S) / ED DIAGNOSES  Final diagnoses:  Dehydration  Community acquired pneumonia of left lower lobe of  lung ([place])       Portions of this note were generated with dragon dictation software. Dictation errors may occur despite best attempts at proofreading.    Carrie Mew, MD 05/21/16 [place], MD 06/12/16 [phone]

## 2016-05-23 LAB — URINE CULTURE: Culture: NO GROWTH

## 2016-07-13 ENCOUNTER — Encounter: Payer: Self-pay | Admitting: Emergency Medicine

## 2016-07-13 ENCOUNTER — Emergency Department: Payer: BLUE CROSS/BLUE SHIELD

## 2016-07-13 ENCOUNTER — Emergency Department
Admission: EM | Admit: 2016-07-13 | Discharge: 2016-07-13 | Disposition: A | Discharge status: Home / Self Care | Payer: BLUE CROSS/BLUE SHIELD | Attending: Emergency Medicine | Admitting: Emergency Medicine

## 2016-07-13 DIAGNOSIS — R1084 Generalized abdominal pain: Secondary | ICD-10-CM | POA: Insufficient documentation

## 2016-07-13 DIAGNOSIS — J111 Influenza due to unidentified influenza virus with other respiratory manifestations: Secondary | ICD-10-CM

## 2016-07-13 DIAGNOSIS — R05 Cough: Secondary | ICD-10-CM | POA: Diagnosis not present

## 2016-07-13 DIAGNOSIS — E119 Type 2 diabetes mellitus without complications: Secondary | ICD-10-CM | POA: Insufficient documentation

## 2016-07-13 DIAGNOSIS — J3489 Other specified disorders of nose and nasal sinuses: Secondary | ICD-10-CM | POA: Diagnosis not present

## 2016-07-13 DIAGNOSIS — Z79899 Other long term (current) drug therapy: Secondary | ICD-10-CM | POA: Diagnosis not present

## 2016-07-13 DIAGNOSIS — R110 Nausea: Secondary | ICD-10-CM | POA: Diagnosis not present

## 2016-07-13 DIAGNOSIS — R6883 Chills (without fever): Secondary | ICD-10-CM | POA: Insufficient documentation

## 2016-07-13 DIAGNOSIS — Z7984 Long term (current) use of oral hypoglycemic drugs: Secondary | ICD-10-CM | POA: Diagnosis not present

## 2016-07-13 DIAGNOSIS — R69 Illness, unspecified: Secondary | ICD-10-CM

## 2016-07-13 LAB — CBC
HEMATOCRIT: 40 % (ref 35.0–47.0)
Hemoglobin: 13.9 g/dL (ref 12.0–16.0)
MCH: 29 pg (ref 26.0–34.0)
MCHC: 34.8 g/dL (ref 32.0–36.0)
MCV: 83.3 fL (ref 80.0–100.0)
Platelets: 164 10*3/uL (ref 150–440)
RBC: 4.8 MIL/uL (ref 3.80–5.20)
RDW: 13.8 % (ref 11.5–14.5)
WBC: 8.9 10*3/uL (ref 3.6–11.0)

## 2016-07-13 LAB — URINALYSIS, COMPLETE (UACMP) WITH MICROSCOPIC
Bilirubin Urine: NEGATIVE
Glucose, UA: 50 mg/dL — AB
Hgb urine dipstick: NEGATIVE
Ketones, ur: 5 mg/dL — AB
Nitrite: NEGATIVE
PH: 5 (ref 5.0–8.0)
Protein, ur: 30 mg/dL — AB
SPECIFIC GRAVITY, URINE: 1.016 (ref 1.005–1.030)
Squamous Epithelial / LPF: NONE SEEN

## 2016-07-13 LAB — COMPREHENSIVE METABOLIC PANEL
ALT: 22 U/L (ref 14–54)
AST: 35 U/L (ref 15–41)
Albumin: 4 g/dL (ref 3.5–5.0)
Alkaline Phosphatase: 102 U/L (ref 38–126)
Anion gap: 8 (ref 5–15)
BILIRUBIN TOTAL: 0.6 mg/dL (ref 0.3–1.2)
BUN: 23 mg/dL — AB (ref 6–20)
CO2: 23 mmol/L (ref 22–32)
CREATININE: 0.94 mg/dL (ref 0.44–1.00)
Calcium: 8.9 mg/dL (ref 8.9–10.3)
Chloride: 108 mmol/L (ref 101–111)
GFR calc Af Amer: >60 mL/min (ref >60)
Glucose, Bld: 203 mg/dL — ABNORMAL HIGH (ref 65–99)
POTASSIUM: 4.2 mmol/L (ref 3.5–5.1)
Sodium: 139 mmol/L (ref 135–145)
TOTAL PROTEIN: 8.1 g/dL (ref 6.5–8.1)

## 2016-07-13 LAB — RAPID INFLUENZA A&B ANTIGENS
Influenza A (ARMC): NEGATIVE
Influenza B (ARMC): NEGATIVE

## 2016-07-13 MED ORDER — KETOROLAC TROMETHAMINE 30 MG/ML IJ SOLN
30.0000 mg | Freq: Once | INTRAMUSCULAR | Status: AC
  Administered 2016-07-13: 30 mg via INTRAVENOUS
  Filled 2016-07-13: qty 1

## 2016-07-13 MED ORDER — SODIUM CHLORIDE 0.9 % IV BOLUS (SEPSIS)
1000.0000 mL | Freq: Once | INTRAVENOUS | Status: AC
  Administered 2016-07-13: 1000 mL via INTRAVENOUS

## 2016-07-13 MED ORDER — ONDANSETRON 4 MG PO TBDP: 4.0000 mg | ORAL_TABLET | Freq: Three times a day (TID) | ORAL | 0 refills | Status: DC | PRN

## 2016-07-13 MED ORDER — ONDANSETRON HCL 4 MG/2ML IJ SOLN
4.0000 mg | Freq: Once | INTRAMUSCULAR | Status: AC
Start: 2016-07-13 — End: 2016-07-13
  Administered 2016-07-13: 4 mg via INTRAVENOUS
  Filled 2016-07-13: qty 2

## 2016-07-13 MED ORDER — FENTANYL CITRATE (PF) 100 MCG/2ML IJ SOLN
50.0000 ug | Freq: Once | INTRAMUSCULAR | Status: AC
  Administered 2016-07-13: 50 ug via INTRAVENOUS
  Filled 2016-07-13: qty 2

## 2016-07-13 MED ORDER — ONDANSETRON HCL 4 MG/2ML IJ SOLN
4.0000 mg | Freq: Once | INTRAMUSCULAR | Status: AC
  Administered 2016-07-13: 4 mg via INTRAVENOUS
  Filled 2016-07-13: qty 2

## 2016-07-13 MED ORDER — KETOROLAC TROMETHAMINE 10 MG PO TABS: 10.0000 mg | ORAL_TABLET | Freq: Three times a day (TID) | ORAL | 0 refills | Status: DC | PRN

## 2016-07-13 NOTE — ED Notes (Signed)
Pt given food and drink . Tolerating well. 

## 2016-07-13 NOTE — ED Triage Notes (Signed)
Pt to ED by EMS with c/o of flu like symptoms. Pt has productive cough. Pt states sputum is "green". Pt told EMS that she has also had a fever, chills and weakness. Pt currently afebrile.

## 2016-07-13 NOTE — Discharge Instructions (Signed)
Please drink plenty of fluid to stay well-hydrated. Please use Tylenol or Toradol for mild to moderate pain, or for fever. Do not take any other NSAID medications including Advil, Aleve, ibuprofen, or Motrin when you're taking Toradol as this can lead to leading or stomach irritation.  Return to the emergency department if you develop severe pain, shortness of breath, inability to keep down fluids, or any other symptoms concerning to you.

## 2016-07-13 NOTE — ED Provider Notes (Signed)
[facility] Emergency Department Provider Note  ____________________________________________  Time seen: Approximately 8:51 AM  I have reviewed the triage vital signs and the nursing notes.   HISTORY  Chief Complaint Flu like symptoms    HPI [name] is a 61 y.o. female with DM 2 presenting with cough, nausea. The patient reports that for the past 3 days she has had a cough productive of thick green phlegm. This morning, she awoke with "dry heaves" associated with nausea but no vomiting. She reports diffuse mild abdominal pain only with coughs, that she attributes to significant cough over the last several days. No focality in her abdominal pain. No associated constipation or diarrhea. No shortness of breath. Positive congestion and rhinorrhea and no sore throat or ear pain. The patient also reports diffuse myalgias. She did have her flu shot this year.  SH: works at [employer]  Past Medical History:  Diagnosis Date  . Arthritis   . Diabetes mellitus without complication [facility])     Patient Active Problem List   Diagnosis Date Noted  . Acute respiratory failure with hypoxia ([place]) 11/04/2015    History reviewed. No pertinent surgical history.  Current Outpatient Rx  . Order #: [id number] Class: Historical Med  . Order #: [id number] Class: Historical Med  . Order #: [id number] Class: Historical Med  . Order #: [id number] Class: Historical Med  . Order #: [id number] Class: Historical Med  . Order #: [id number] Class: Print  . Order #: [id number] Class: Print  . Order #: [id number] Class: Print  . Order #: [id number] Class: Print    Allergies Patient has no known allergies.  Family History  Problem Relation Age of Onset  . Breast cancer Maternal Aunt     Social History Social History  Substance Use Topics  . Smoking status: Never Smoker  . Smokeless tobacco: Never Used  . Alcohol use No    Review of Systems  Constitutional: Positive chills with no  fever. Diffuse myalgias and general malaise. No lightheadedness or syncope. Eyes: No visual changes. No eye discharge. ENT: No sore throat. Positive congestion and clear rhinorrhea. No ear pain. Cardiovascular: Denies chest pain. Denies palpitations. Respiratory: Denies shortness of breath.  Positive productive cough. Gastrointestinal: No abdominal pain.  Positive nausea, no vomiting.  No diarrhea.  No constipation. Genitourinary: Negative for dysuria. Musculoskeletal: Negative for back pain. Skin: Negative for rash. Neurological: Negative for headaches. No focal numbness, tingling or weakness.   10-point ROS otherwise negative.  ____________________________________________   PHYSICAL EXAM:  VITAL SIGNS: ED Triage Vitals  Enc Vitals Group     BP 07/13/16 0834 (!) 151/95     Pulse Rate 07/13/16 0834 (!) 103     Resp 07/13/16 0834 (!) 22     Temp 07/13/16 0834 98.5 F (36.9 C)     Temp Source 07/13/16 0834 Oral     SpO2 07/13/16 0827 99 %     Weight 07/13/16 0834 140 lb (63.5 kg)     Height 07/13/16 0834 5\' 5"  (1.651 m)     Head Circumference --      Peak Flow --      Pain Score 07/13/16 0838 10     Pain Loc --      Pain Edu? --      Excl. in [place]? --     Constitutional: Alert and oriented. Uncomfortable appearing but in no acute distress. Answers questions appropriately. Eyes: Conjunctivae are normal.  EOMI. No scleral icterus. Head: Atraumatic. Nose: Congestion with clear rhinorrhea. Mouth/Throat: Mucous membranes are  moist.  Neck: No stridor.  Supple.  No meningismus. Cardiovascular: Normal rate, regular rhythm. No murmurs, rubs or gallops.  Respiratory: Normal respiratory effort.  No accessory muscle use or retractions. Lungs CTAB.  No wheezes, rales or ronchi. Intermittent coughing during my examination. Gastrointestinal: Soft,  and nondistended.  Diffuse minimal tenderness throughout the entire abdomen without focality. No guarding or rebound.  No peritoneal  signs. Musculoskeletal: No LE edema. No ttp in the calves or palpable cords.  Negative Homan's sign. Neurologic:  A&Ox3.  Speech is clear.  Face and smile are symmetric.  EOMI.  Moves all extremities well. Skin:  Skin is warm, dry and intact. No rash noted. Psychiatric: Mood and affect are normal. Speech and behavior are normal.  Normal judgement.  ____________________________________________   LABS (all labs ordered are listed, but only abnormal results are displayed)  Labs Reviewed  COMPREHENSIVE METABOLIC PANEL - Abnormal; Notable for the following:       Result Value   Glucose, Bld 203 (*)    BUN 23 (*)    All other components within normal limits  URINALYSIS, COMPLETE (UACMP) WITH MICROSCOPIC - Abnormal; Notable for the following:    Color, Urine YELLOW (*)    APPearance CLEAR (*)    Glucose, UA 50 (*)    Ketones, ur 5 (*)    Protein, ur 30 (*)    Leukocytes, UA SMALL (*)    Bacteria, UA RARE (*)    Non Squamous Epithelial 0-5 (*)    All other components within normal limits  RAPID INFLUENZA A&B ANTIGENS ([facility] ONLY)  CBC   ____________________________________________  EKG  ED ECG REPORT I, Eula Listen, the attending physician, personally viewed and interpreted this ECG.   Date: 07/13/2016  EKG Time: 922  Rate: 105  Rhythm: sinus tachycardia  Axis: normal  Intervals:none  ST&T Change: RBBB; no STEMI.  ____________________________________________  RADIOLOGY  Dg Chest 2 View  Result Date: 07/13/2016 CLINICAL DATA:  Productive cough, fever, weakness EXAM: CHEST  2 VIEW COMPARISON:  05/21/2016 FINDINGS: Minor basilar atelectasis. Normal heart size and vascularity. No focal pneumonia, collapse or consolidation. No edema, effusion or pneumothorax. Trachea is midline. Degenerative changes of the spine. No superimposed acute process. IMPRESSION: Minor basilar atelectasis.  No superimposed acute process. Electronically Signed   By: Jerilynn Mages.  Shick M.D.   On:  07/13/2016 09:29    ____________________________________________   PROCEDURES  Procedure(s) performed: None  Procedures  Critical Care performed: No ____________________________________________   INITIAL IMPRESSION / ASSESSMENT AND PLAN / ED COURSE  Pertinent labs & imaging results that were available during my care of the patient were reviewed by me and considered in my medical decision making (see chart for details).  61 y.o. female resenting with 3 days of productive cough, now with nausea. Overall, the patient is uncomfortable appearing but nontoxic. It is possible that she has an upper respiratory infection or flulike illness. We'll check her for influenza, rule out pneumonia with chest x-ray. I do not think she has any acute intra-abdominal pathology although she is having nausea. Her abdominal exam does not have any focality, and her pain only occurs with contraction, which is consistent with muscular skeletal strain from several days of significant cough. She is mildly tachycardic on arrival with a heart rate in the low 100s, we will give her fluids and check an EKG. Plan reevaluation for final disposition.  ----------------------------------------- 9:33 AM on 07/13/2016 -----------------------------------------  Patient's chest x-ray shows bibasilar atelectasis without any infiltrate.  -----------------------------------------  10:37 AM on 07/13/2016 -----------------------------------------  The patient's influenza testing is negative, her white blood cell count is normal, and her electrolytes are reassuring. Again, her chest x-ray does not show pneumonia. At this time, it is most likely that the patient has a flulike illness or a URI or GI viral infection. Her heart rate is in the low 100s, so continue to treat her with intravenous fluids because she states her nausea has improved but is not gone so we'll give her another dose of Zofran. She continues have some mild body  aches, so continue to treat her pain. I plan to reevaluate the patient for final disposition but anticipate discharge at this time.  ----------------------------------------- 11:59 AM on 07/13/2016 -----------------------------------------  The patient is feeling better at this time. Her tachycardia has resolved, her pain has significantly improved, and she is no longer nauseated. We will do a by mouth challenge, and anticipate discharge home at this time. I discussed return instructions as well as follow-up instructions with the patient and her family. ____________________________________________  FINAL CLINICAL IMPRESSION(S) / ED DIAGNOSES  Final diagnoses:  Influenza-like illness    Clinical Course       NEW MEDICATIONS STARTED DURING THIS VISIT:  New Prescriptions   KETOROLAC (TORADOL) 10 MG TABLET    Take 1 tablet (10 mg total) by mouth every 8 (eight) hours as needed for moderate pain (with food).   ONDANSETRON (ZOFRAN ODT) 4 MG DISINTEGRATING TABLET    Take 1 tablet (4 mg total) by mouth every 8 (eight) hours as needed for nausea or vomiting.      Eula Listen, MD 07/13/16 1159

## 2016-10-06 ENCOUNTER — Emergency Department: Payer: BLUE CROSS/BLUE SHIELD

## 2016-10-06 ENCOUNTER — Emergency Department
Admission: EM | Admit: 2016-10-06 | Discharge: 2016-10-06 | Disposition: A | Discharge status: Home / Self Care | Payer: BLUE CROSS/BLUE SHIELD | Attending: Emergency Medicine | Admitting: Emergency Medicine

## 2016-10-06 DIAGNOSIS — E119 Type 2 diabetes mellitus without complications: Secondary | ICD-10-CM | POA: Insufficient documentation

## 2016-10-06 DIAGNOSIS — R109 Unspecified abdominal pain: Secondary | ICD-10-CM

## 2016-10-06 DIAGNOSIS — N814 Uterovaginal prolapse, unspecified: Secondary | ICD-10-CM | POA: Diagnosis not present

## 2016-10-06 DIAGNOSIS — K5732 Diverticulitis of large intestine without perforation or abscess without bleeding: Secondary | ICD-10-CM | POA: Insufficient documentation

## 2016-10-06 DIAGNOSIS — Z7984 Long term (current) use of oral hypoglycemic drugs: Secondary | ICD-10-CM | POA: Diagnosis not present

## 2016-10-06 DIAGNOSIS — R1032 Left lower quadrant pain: Secondary | ICD-10-CM | POA: Diagnosis present

## 2016-10-06 LAB — COMPREHENSIVE METABOLIC PANEL
ALBUMIN: 4 g/dL (ref 3.5–5.0)
ALK PHOS: 83 U/L (ref 38–126)
ALT: 21 U/L (ref 14–54)
AST: 26 U/L (ref 15–41)
Anion gap: 10 (ref 5–15)
BUN: 26 mg/dL — ABNORMAL HIGH (ref 6–20)
CALCIUM: 8.6 mg/dL — AB (ref 8.9–10.3)
CHLORIDE: 103 mmol/L (ref 101–111)
CO2: 24 mmol/L (ref 22–32)
CREATININE: 1 mg/dL (ref 0.44–1.00)
GFR calc non Af Amer: >60 mL/min (ref >60)
GLUCOSE: 129 mg/dL — AB (ref 65–99)
Potassium: 4.7 mmol/L (ref 3.5–5.1)
SODIUM: 137 mmol/L (ref 135–145)
Total Bilirubin: 0.7 mg/dL (ref 0.3–1.2)
Total Protein: 7.6 g/dL (ref 6.5–8.1)

## 2016-10-06 LAB — URINALYSIS, COMPLETE (UACMP) WITH MICROSCOPIC
BILIRUBIN URINE: NEGATIVE
Bacteria, UA: NONE SEEN
GLUCOSE, UA: NEGATIVE mg/dL
HGB URINE DIPSTICK: NEGATIVE
Ketones, ur: NEGATIVE mg/dL
Nitrite: NEGATIVE
Protein, ur: NEGATIVE mg/dL
SPECIFIC GRAVITY, URINE: 1.017 (ref 1.005–1.030)
pH: 5 (ref 5.0–8.0)

## 2016-10-06 LAB — CBC
HEMATOCRIT: 37 % (ref 35.0–47.0)
Hemoglobin: 12.6 g/dL (ref 12.0–16.0)
MCH: 28.8 pg (ref 26.0–34.0)
MCHC: 34.2 g/dL (ref 32.0–36.0)
MCV: 84.4 fL (ref 80.0–100.0)
Platelets: 169 10*3/uL (ref 150–440)
RBC: 4.38 MIL/uL (ref 3.80–5.20)
RDW: 14.6 % — AB (ref 11.5–14.5)
WBC: 11.5 10*3/uL — ABNORMAL HIGH (ref 3.6–11.0)

## 2016-10-06 LAB — LIPASE, BLOOD: Lipase: 50 U/L (ref 11–51)

## 2016-10-06 MED ORDER — TRAMADOL HCL 50 MG PO TABS
50.0000 mg | ORAL_TABLET | Freq: Once | ORAL | Status: AC
  Administered 2016-10-06: 50 mg via ORAL
  Filled 2016-10-06: qty 1

## 2016-10-06 MED ORDER — CIPROFLOXACIN HCL 500 MG PO TABS
500.0000 mg | ORAL_TABLET | Freq: Once | ORAL | Status: AC
  Administered 2016-10-06: 500 mg via ORAL
  Filled 2016-10-06: qty 1

## 2016-10-06 MED ORDER — METRONIDAZOLE 500 MG PO TABS: 500.0000 mg | ORAL_TABLET | Freq: Two times a day (BID) | ORAL | 0 refills | Status: DC

## 2016-10-06 MED ORDER — CIPROFLOXACIN HCL 500 MG PO TABS: 500.0000 mg | ORAL_TABLET | Freq: Two times a day (BID) | ORAL | 0 refills | Status: DC

## 2016-10-06 MED ORDER — HYDROCODONE-ACETAMINOPHEN 5-325 MG PO TABS
1.0000 | ORAL_TABLET | Freq: Once | ORAL | Status: AC
  Administered 2016-10-06: 1 via ORAL
  Filled 2016-10-06: qty 1

## 2016-10-06 MED ORDER — METRONIDAZOLE 500 MG PO TABS
500.0000 mg | ORAL_TABLET | Freq: Once | ORAL | Status: AC
  Administered 2016-10-06: 500 mg via ORAL
  Filled 2016-10-06: qty 1

## 2016-10-06 MED ORDER — HYDROCODONE-ACETAMINOPHEN 5-325 MG PO TABS: 1.0000 | ORAL_TABLET | ORAL | 0 refills | Status: DC | PRN

## 2016-10-06 NOTE — ED Triage Notes (Signed)
States left flank pain for 2 months, states pressure in her vagina area and burning upon urination

## 2016-10-06 NOTE — ED Notes (Signed)
Patient transported to CT 

## 2016-10-06 NOTE — ED Provider Notes (Signed)
[facility] Emergency Department Provider Note   ____________________________________________    I have reviewed the triage vital signs and the nursing notes.   HISTORY  Chief Complaint Flank Pain     HPI [name] is a 61 y.o. female who presents with complaints of left lower quadrant cramping abdominal pain. Patient reports his pain has been going on for over a week but seems to be getting worse. She denies fevers or chills. No nausea or vomiting. She does have a history of diabetes. She is never had this pain before. In addition she also complains of "something coming out of me " to which she is referring something coming out of her vagina when she does a lot of walking. This does not cause her any pain.   Past Medical History:  Diagnosis Date  . Arthritis   . Diabetes mellitus without complication [facility])     Patient Active Problem List   Diagnosis Date Noted  . Acute respiratory failure with hypoxia ([place]) 11/04/2015    No past surgical history on file.  Prior to Admission medications   Medication Sig Start Date End Date Taking? Authorizing Provider  ciprofloxacin (CIPRO) 500 MG tablet Take 1 tablet (500 mg total) by mouth 2 (two) times daily. 10/06/16   Lavonia Drafts, MD  gabapentin (NEURONTIN) 100 MG capsule Take 100 mg by mouth 2 (two) times daily. 08/16/15   Historical Provider, MD  glipiZIDE (GLUCOTROL) 5 MG tablet Take 1 tablet by mouth daily. 06/02/16   Historical Provider, MD  HYDROcodone-acetaminophen (NORCO/VICODIN) 5-325 MG tablet Take 1 tablet by mouth every 4 (four) hours as needed for moderate pain. 10/06/16   Lavonia Drafts, MD  ibuprofen (ADVIL,MOTRIN) 800 MG tablet Take 1 tablet (800 mg total) by mouth every 8 (eight) hours as needed. Patient not taking: Reported on 07/13/2016 08/11/15   [name], PA-C  ketorolac (TORADOL) 10 MG tablet Take 1 tablet (10 mg total) by mouth every 8 (eight) hours as needed for moderate pain (with  food). 07/13/16   Eula Listen, MD  levothyroxine (SYNTHROID, LEVOTHROID) 75 MCG tablet Take 75 mcg by mouth daily before breakfast. Take 30 to 60 minutes before meal. 08/31/15   Historical Provider, MD  metFORMIN (GLUCOPHAGE-XR) 500 MG 24 hr tablet Take 1,000 mg by mouth daily. 08/31/15   Historical Provider, MD  metroNIDAZOLE (FLAGYL) 500 MG tablet Take 1 tablet (500 mg total) by mouth 2 (two) times daily after a meal. 10/06/16   Lavonia Drafts, MD  ondansetron (ZOFRAN ODT) 4 MG disintegrating tablet Take 1 tablet (4 mg total) by mouth every 8 (eight) hours as needed for nausea or vomiting. 07/13/16   Eula Listen, MD  simethicone (MYLICON) 80 MG chewable tablet Chew 80 mg by mouth every 6 (six) hours as needed for flatulence.    Historical Provider, MD     Allergies Patient has no known allergies.  Family History  Problem Relation Age of Onset  . Breast cancer Maternal Aunt     Social History Social History  Substance Use Topics  . Smoking status: Never Smoker  . Smokeless tobacco: Never Used  . Alcohol use No    Review of Systems  Constitutional: No fever/chills Eyes: No visual changes.  ENT: No sore throat. Cardiovascular: Denies chest pain. Respiratory: Denies shortness of breath. Gastrointestinal: As above Genitourinary: As above  Skin: Negative for rash. Neurological: Negative for headaches  10-point ROS otherwise negative.  ____________________________________________   PHYSICAL EXAM:  VITAL SIGNS: ED  Triage Vitals  Enc Vitals Group     BP 10/06/16 1900 122/62     Pulse Rate 10/06/16 1900 92     Resp 10/06/16 1900 18     Temp 10/06/16 1900 97.8 F (36.6 C)     Temp Source 10/06/16 1900 Oral     SpO2 10/06/16 1900 100 %     Weight 10/06/16 1858 143 lb (64.9 kg)     Height 10/06/16 1858 5\' 8"  (1.727 m)     Head Circumference --      Peak Flow --      Pain Score 10/06/16 1858 9     Pain Loc --      Pain Edu? --      Excl. in [place]? --      Constitutional: Alert and oriented. No acute distress. Pleasant and interactive Eyes: Conjunctivae are normal.   Nose: No congestion/rhinnorhea. Mouth/Throat: Mucous membranes are moist.    Cardiovascular: Normal rate, regular rhythm. Grossly normal heart sounds.  Good peripheral circulation. Respiratory: Normal respiratory effort.  No retractions. Lungs CTAB. Gastrointestinal: Mild tenderness to palpation left lower quadrant. No distention.  No CVA tenderness. Genitourinary: Cervix visible at the vaginal introitus Musculoskeletal: .  Warm and well perfused Neurologic:  Normal speech and language. No gross focal neurologic deficits are appreciated.  Skin:  Skin is warm, dry and intact. No rash noted. Psychiatric: Mood and affect are normal. Speech and behavior are normal.  ____________________________________________   LABS (all labs ordered are listed, but only abnormal results are displayed)  Labs Reviewed  URINALYSIS, COMPLETE (UACMP) WITH MICROSCOPIC - Abnormal; Notable for the following:       Result Value   Color, Urine YELLOW (*)    APPearance HAZY (*)    Leukocytes, UA LARGE (*)    Squamous Epithelial / LPF 6-30 (*)    Non Squamous Epithelial 0-5 (*)    All other components within normal limits  CBC - Abnormal; Notable for the following:    WBC 11.5 (*)    RDW 14.6 (*)    All other components within normal limits  COMPREHENSIVE METABOLIC PANEL - Abnormal; Notable for the following:    Glucose, Bld 129 (*)    BUN 26 (*)    Calcium 8.6 (*)    All other components within normal limits  LIPASE, BLOOD   ____________________________________________  EKG  None ____________________________________________  RADIOLOGY  CT demonstrates diverticulitis with no abscess ____________________________________________   PROCEDURES  Procedure(s) performed: No    Critical Care performed: No ____________________________________________   INITIAL IMPRESSION /  ASSESSMENT AND PLAN / ED COURSE  Pertinent labs & imaging results that were available during my care of the patient were reviewed by me and considered in my medical decision making (see chart for details).   Lab work is overall reassuring. Patient with evidence of diverticulitis on CT. We will treat with Flagyl and Cipro given no evidence of abscess. She also apparently has a uterine prolapse for which she has gynecology appointment already scheduled. We discussed return precautions. Patient agrees with plan    ____________________________________________   FINAL CLINICAL IMPRESSION(S) / ED DIAGNOSES  Final diagnoses:  Acute left flank pain  Diverticulitis of large intestine without perforation or abscess without bleeding  Uterine prolapse      NEW MEDICATIONS STARTED DURING THIS VISIT:  Discharge Medication List as of 10/06/2016 10:02 PM    START taking these medications   Details  ciprofloxacin (CIPRO) 500 MG tablet Take 1 tablet (  500 mg total) by mouth 2 (two) times daily., Starting Mon 10/06/2016, Print    metroNIDAZOLE (FLAGYL) 500 MG tablet Take 1 tablet (500 mg total) by mouth 2 (two) times daily after a meal., Starting Mon 10/06/2016, Print         Note:  This document was prepared using Dragon voice recognition software and may include unintentional dictation errors.    Lavonia Drafts, MD 10/06/16 2330

## 2017-01-18 ENCOUNTER — Inpatient Hospital Stay
Admission: EM | Admit: 2017-01-18 | Discharge: 2017-01-19 | DRG: 684 | Disposition: A | Discharge status: Home / Self Care | Payer: BLUE CROSS/BLUE SHIELD | Attending: Internal Medicine | Admitting: Internal Medicine

## 2017-01-18 DIAGNOSIS — N179 Acute kidney failure, unspecified: Secondary | ICD-10-CM | POA: Diagnosis not present

## 2017-01-18 DIAGNOSIS — Z7984 Long term (current) use of oral hypoglycemic drugs: Secondary | ICD-10-CM | POA: Diagnosis not present

## 2017-01-18 DIAGNOSIS — Z79899 Other long term (current) drug therapy: Secondary | ICD-10-CM

## 2017-01-18 DIAGNOSIS — R531 Weakness: Secondary | ICD-10-CM

## 2017-01-18 DIAGNOSIS — E860 Dehydration: Secondary | ICD-10-CM | POA: Diagnosis present

## 2017-01-18 DIAGNOSIS — E039 Hypothyroidism, unspecified: Secondary | ICD-10-CM | POA: Diagnosis present

## 2017-01-18 DIAGNOSIS — E119 Type 2 diabetes mellitus without complications: Secondary | ICD-10-CM | POA: Diagnosis present

## 2017-01-18 DIAGNOSIS — M1990 Unspecified osteoarthritis, unspecified site: Secondary | ICD-10-CM | POA: Diagnosis present

## 2017-01-18 LAB — CBC WITH DIFFERENTIAL/PLATELET
BASOS ABS: 0 10*3/uL (ref 0–0.1)
BASOS PCT: 1 %
Eosinophils Absolute: 0 10*3/uL (ref 0–0.7)
Eosinophils Relative: 0 %
HEMATOCRIT: 35.3 % (ref 35.0–47.0)
HEMOGLOBIN: 12.3 g/dL (ref 12.0–16.0)
LYMPHS PCT: 23 %
Lymphs Abs: 1.9 10*3/uL (ref 1.0–3.6)
MCH: 29.3 pg (ref 26.0–34.0)
MCHC: 34.7 g/dL (ref 32.0–36.0)
MCV: 84.3 fL (ref 80.0–100.0)
Monocytes Absolute: 0.4 10*3/uL (ref 0.2–0.9)
Monocytes Relative: 5 %
NEUTROS ABS: 5.8 10*3/uL (ref 1.4–6.5)
NEUTROS PCT: 71 %
Platelets: 163 10*3/uL (ref 150–440)
RBC: 4.19 MIL/uL (ref 3.80–5.20)
RDW: 14.4 % (ref 11.5–14.5)
WBC: 8.2 10*3/uL (ref 3.6–11.0)

## 2017-01-18 LAB — URINALYSIS, COMPLETE (UACMP) WITH MICROSCOPIC
Bilirubin Urine: NEGATIVE
GLUCOSE, UA: NEGATIVE mg/dL
HGB URINE DIPSTICK: NEGATIVE
KETONES UR: NEGATIVE mg/dL
Nitrite: NEGATIVE
PROTEIN: NEGATIVE mg/dL
Specific Gravity, Urine: 1.011 (ref 1.005–1.030)
pH: 5 (ref 5.0–8.0)

## 2017-01-18 LAB — COMPREHENSIVE METABOLIC PANEL
ALBUMIN: 4.2 g/dL (ref 3.5–5.0)
ALK PHOS: 94 U/L (ref 38–126)
ALT: 20 U/L (ref 14–54)
AST: 46 U/L — AB (ref 15–41)
Anion gap: 14 (ref 5–15)
BUN: 42 mg/dL — AB (ref 6–20)
CO2: 20 mmol/L — ABNORMAL LOW (ref 22–32)
CREATININE: 2.67 mg/dL — AB (ref 0.44–1.00)
Calcium: 9.1 mg/dL (ref 8.9–10.3)
Chloride: 109 mmol/L (ref 101–111)
GFR calc Af Amer: 22 mL/min — ABNORMAL LOW (ref >60)
GFR calc non Af Amer: 19 mL/min — ABNORMAL LOW (ref >60)
GLUCOSE: 129 mg/dL — AB (ref 65–99)
POTASSIUM: 4.9 mmol/L (ref 3.5–5.1)
Sodium: 143 mmol/L (ref 135–145)
TOTAL PROTEIN: 7.8 g/dL (ref 6.5–8.1)
Total Bilirubin: 0.4 mg/dL (ref 0.3–1.2)

## 2017-01-18 LAB — GLUCOSE, CAPILLARY
GLUCOSE-CAPILLARY: 123 mg/dL — AB (ref 65–99)
Glucose-Capillary: 153 mg/dL — ABNORMAL HIGH (ref 65–99)

## 2017-01-18 LAB — TROPONIN I: Troponin I: <0.03 ng/mL (ref <0.03)

## 2017-01-18 LAB — POCT CBG MONITORING: CBG: 153

## 2017-01-18 MED ORDER — ACETAMINOPHEN 325 MG PO TABS: 650.0000 mg | ORAL_TABLET | Freq: Four times a day (QID) | ORAL | Status: DC | PRN

## 2017-01-18 MED ORDER — ONDANSETRON HCL 4 MG/2ML IJ SOLN: 4.0000 mg | Freq: Four times a day (QID) | INTRAMUSCULAR | Status: DC | PRN

## 2017-01-18 MED ORDER — SODIUM CHLORIDE 0.9 % IV BOLUS (SEPSIS)
1000.0000 mL | Freq: Once | INTRAVENOUS | Status: AC
  Administered 2017-01-18: 1000 mL via INTRAVENOUS

## 2017-01-18 MED ORDER — ONDANSETRON HCL 4 MG PO TABS: 4.0000 mg | ORAL_TABLET | Freq: Four times a day (QID) | ORAL | Status: DC | PRN

## 2017-01-18 MED ORDER — POLYETHYLENE GLYCOL 3350 17 G PO PACK: 17.0000 g | PACK | Freq: Every day | ORAL | Status: DC | PRN

## 2017-01-18 MED ORDER — SODIUM CHLORIDE 0.9 % IV SOLN
INTRAVENOUS | Status: DC
  Administered 2017-01-18 – 2017-01-19 (×2): via INTRAVENOUS

## 2017-01-18 MED ORDER — ENOXAPARIN SODIUM 40 MG/0.4ML ~~LOC~~ SOLN
30.0000 mg | SUBCUTANEOUS | Status: DC
  Administered 2017-01-18: 21:00:00 30 mg via SUBCUTANEOUS
  Filled 2017-01-18: qty 0.4

## 2017-01-18 MED ORDER — ACETAMINOPHEN 650 MG RE SUPP: 650.0000 mg | Freq: Four times a day (QID) | RECTAL | Status: DC | PRN

## 2017-01-18 MED ORDER — INSULIN ASPART 100 UNIT/ML ~~LOC~~ SOLN
0.0000 [IU] | Freq: Three times a day (TID) | SUBCUTANEOUS | Status: DC
  Administered 2017-01-18: 19:00:00 2 [IU] via SUBCUTANEOUS
  Administered 2017-01-19: 13:00:00 1 [IU] via SUBCUTANEOUS
  Filled 2017-01-18 (×2): qty 1

## 2017-01-18 NOTE — H&P (Signed)
[name]    MR#:  [id number]  DATE OF BIRTH:  Apr 04, 1956  DATE OF ADMISSION:  01/18/2017  PRIMARY CARE PHYSICIAN: Maryland Pink, MD   REQUESTING/REFERRING PHYSICIAN: Dr Archie Balboa  CHIEF COMPLAINT:  I am not just feeling well Patient's husband and son in the room.  HISTORY OF PRESENT ILLNESS:  [name]  is a 61 y.o. female with a known history ofDiabetes and hypothyroidism comes in from [place] after she started not feeling well became very weak. Patient's husband said she ate lunch and thereafter put her head on the table and was not able to get up after that. She was  Fatigued and  brought to the emergency room found to be in acute renal failure appears from dehydration. Patient has dry oral mucosa and appears weak and dehydrated. She is otherwise hemodynamically stable being admitted for acute renal failure.  Her creatinine today is 2.67. Baseline creatinine is 0.9.  PAST MEDICAL HISTORY:   Past Medical History:  Diagnosis Date  . Arthritis   . Diabetes mellitus without complication ([place])     PAST SURGICAL HISTOIRY:  History reviewed. No pertinent surgical history.  SOCIAL HISTORY:   Social History  Substance Use Topics  . Smoking status: Never Smoker  . Smokeless tobacco: Never Used  . Alcohol use No    FAMILY HISTORY:   Family History  Problem Relation Age of Onset  . Breast cancer Maternal Aunt     DRUG ALLERGIES:  No Known Allergies  REVIEW OF SYSTEMS:  Review of Systems  Constitutional: Negative for chills, fever and weight loss.  HENT: Negative for ear discharge, ear pain and nosebleeds.   Eyes: Negative for blurred vision, pain and discharge.  Respiratory: Negative for sputum production, shortness of breath, wheezing and stridor.   Cardiovascular: Negative for chest pain, palpitations, orthopnea and PND.  Gastrointestinal: Negative for abdominal pain, diarrhea,  nausea and vomiting.  Genitourinary: Negative for frequency and urgency.  Musculoskeletal: Negative for back pain and joint pain.  Neurological: Positive for dizziness and weakness. Negative for sensory change, speech change and focal weakness.  Psychiatric/Behavioral: Negative for depression and hallucinations. The patient is not nervous/anxious.      MEDICATIONS AT HOME:   Prior to Admission medications   Medication Sig Start Date End Date Taking? Authorizing Provider  ciprofloxacin (CIPRO) 500 MG tablet Take 1 tablet (500 mg total) by mouth 2 (two) times daily. 10/06/16   Lavonia Drafts, MD  gabapentin (NEURONTIN) 100 MG capsule Take 100 mg by mouth 2 (two) times daily. 08/16/15   [provider]  glipiZIDE (GLUCOTROL) 5 MG tablet Take 1 tablet by mouth daily. 06/02/16   [provider]  HYDROcodone-acetaminophen (NORCO/VICODIN) 5-325 MG tablet Take 1 tablet by mouth every 4 (four) hours as needed for moderate pain. 10/06/16   Lavonia Drafts, MD  ibuprofen (ADVIL,MOTRIN) 800 MG tablet Take 1 tablet (800 mg total) by mouth every 8 (eight) hours as needed. Patient not taking: Reported on 07/13/2016 08/11/15   [name], PA-C  ketorolac (TORADOL) 10 MG tablet Take 1 tablet (10 mg total) by mouth every 8 (eight) hours as needed for moderate pain (with food). 07/13/16   Eula Listen, MD  levothyroxine (SYNTHROID, LEVOTHROID) 75 MCG tablet Take 75 mcg by mouth daily before breakfast. Take 30 to 60 minutes before meal. 08/31/15   [provider]  metFORMIN (GLUCOPHAGE-XR) 500 MG 24 hr tablet Take 1,000 mg by  mouth daily. 08/31/15   [provider]  metroNIDAZOLE (FLAGYL) 500 MG tablet Take 1 tablet (500 mg total) by mouth 2 (two) times daily after a meal. 10/06/16   Lavonia Drafts, MD  ondansetron (ZOFRAN ODT) 4 MG disintegrating tablet Take 1 tablet (4 mg total) by mouth every 8 (eight) hours as needed for nausea or vomiting. 07/13/16   Eula Listen, MD   simethicone (MYLICON) 80 MG chewable tablet Chew 80 mg by mouth every 6 (six) hours as needed for flatulence.    [provider]      VITAL SIGNS:  Blood pressure 103/88, pulse (!) 101, temperature 98.3 F (36.8 C), temperature source Oral, resp. rate (!) 22, height 5\' 6"  (1.676 m), weight 64 kg (141 lb), SpO2 95 %.  PHYSICAL EXAMINATION:  GENERAL:  61 y.o.-year-old patient lying in the bed with no acute distress.  EYES: Pupils equal, round, reactive to light and accommodation. No scleral icterus. Extraocular muscles intact.  HEENT: Head atraumatic, normocephalic. Oropharynx and nasopharynx clear. Oral mucosa dry, poor dentition NECK:  Supple, no jugular venous distention. No thyroid enlargement, no tenderness.  LUNGS: Normal breath sounds bilaterally, no wheezing, rales,rhonchi or crepitation. No use of accessory muscles of respiration.  CARDIOVASCULAR: S1, S2 normal. No murmurs, rubs, or gallops.  ABDOMEN: Soft, nontender, nondistended. Bowel sounds present. No organomegaly or mass.  EXTREMITIES: No pedal edema, cyanosis, or clubbing.  NEUROLOGIC: Cranial nerves II through XII are intact. Muscle strength 5/5 in all extremities. Sensation intact. Gait not checked.  PSYCHIATRIC: The patient is alert and fatigued.  SKIN: No obvious rash, lesion, or ulcer.   LABORATORY PANEL:   CBC  Recent Labs Lab 01/18/17 1536  WBC 8.2  HGB 12.3  HCT 35.3  PLT 163   ------------------------------------------------------------------------------------------------------------------  Chemistries   Recent Labs Lab 01/18/17 1536  NA 143  K 4.9  CL 109  CO2 20*  GLUCOSE 129*  BUN 42*  CREATININE 2.67*  CALCIUM 9.1  AST 46*  ALT 20  ALKPHOS 94  BILITOT 0.4   ------------------------------------------------------------------------------------------------------------------  Cardiac Enzymes  Recent Labs Lab 01/18/17 1536  TROPONINI <0.03    ------------------------------------------------------------------------------------------------------------------  RADIOLOGY:  No results found.  EKG:    IMPRESSION AND PLAN:  [name]  is a 61 y.o. female with a known history ofDiabetes and hypothyroidism comes in from [facility] after she started not feeling well became very weak. Patient's husband said she ate lunch and thereafter put her head on the table and was not able to get up after that.  1. Acute renal failure due to severe dehydration -admit to medical floor -IVF--aggressive -came in with creat of 2.67 -baseline crest 0.9 -I and o, avoid nephrotoxins  2.DM-2 - SSI -resume home meds once po intake improves  3. Hypothyroidism Synthroid  4. DVT prophylaxis Lovenox (renal dose)   All the records are reviewed and case discussed with ED provider. Management plans discussed with the patient, family and they are in agreement.  CODE STATUS: Full  TOTAL TIME TAKING CARE OF THIS PATIENT: *40 minutes.    [name] M.D on 01/18/2017 at 4:48 PM  Between 7am to 6pm - Pager - [phone]  After 6pm go to [email or web address] - password [email or web address]  [facility]  Office  [phone]  CC: Primary care physician; [place] Pink, MD

## 2017-01-18 NOTE — ED Triage Notes (Signed)
Per EMS pt was working at [employer] today and was found with her head on a table sitting down.  She wasn't responding quickly when spoken to.  Staff knows she is a diabetic and gave her orange juice and called EMS.  Pt is A&Ox4 and states being dizzy when standing.

## 2017-01-18 NOTE — ED Provider Notes (Signed)
[id number] Emergency Department Provider Note   ____________________________________________   I have reviewed the triage vital signs and the nursing notes.   HISTORY  Chief Complaint Weakness  History limited by: Not Limited   HPI [name] is a 61 y.o. female who presents to the emergency department today because of concerns for weakness. Patient states she has felt unwell all day. Has been constant throughout the day. She has felt weak. She denies any pain. Today at work she became very weak and had a near syncopal episode. The patient is a known diabetic and apparently blood sugars were thought to be low at her work. She was given some juice. She denies any chest pain or shortness of breath.   Past Medical History:  Diagnosis Date  . Arthritis   . Diabetes mellitus without complication [facility])     Patient Active Problem List   Diagnosis Date Noted  . Acute respiratory failure with hypoxia ([place]) 11/04/2015    No past surgical history on file.  Prior to Admission medications   Medication Sig Start Date End Date Taking? Authorizing Provider  ciprofloxacin (CIPRO) 500 MG tablet Take 1 tablet (500 mg total) by mouth 2 (two) times daily. 10/06/16   Lavonia Drafts, MD  gabapentin (NEURONTIN) 100 MG capsule Take 100 mg by mouth 2 (two) times daily. 08/16/15   [provider]  glipiZIDE (GLUCOTROL) 5 MG tablet Take 1 tablet by mouth daily. 06/02/16   [provider]  HYDROcodone-acetaminophen (NORCO/VICODIN) 5-325 MG tablet Take 1 tablet by mouth every 4 (four) hours as needed for moderate pain. 10/06/16   Lavonia Drafts, MD  ibuprofen (ADVIL,MOTRIN) 800 MG tablet Take 1 tablet (800 mg total) by mouth every 8 (eight) hours as needed. Patient not taking: Reported on 07/13/2016 08/11/15   [name], PA-C  ketorolac (TORADOL) 10 MG tablet Take 1 tablet (10 mg total) by mouth every 8 (eight) hours as needed for moderate pain (with food). 07/13/16    Eula Listen, MD  levothyroxine (SYNTHROID, LEVOTHROID) 75 MCG tablet Take 75 mcg by mouth daily before breakfast. Take 30 to 60 minutes before meal. 08/31/15   [provider]  metFORMIN (GLUCOPHAGE-XR) 500 MG 24 hr tablet Take 1,000 mg by mouth daily. 08/31/15   [provider]  metroNIDAZOLE (FLAGYL) 500 MG tablet Take 1 tablet (500 mg total) by mouth 2 (two) times daily after a meal. 10/06/16   Lavonia Drafts, MD  ondansetron (ZOFRAN ODT) 4 MG disintegrating tablet Take 1 tablet (4 mg total) by mouth every 8 (eight) hours as needed for nausea or vomiting. 07/13/16   Eula Listen, MD  simethicone (MYLICON) 80 MG chewable tablet Chew 80 mg by mouth every 6 (six) hours as needed for flatulence.    [provider]    Allergies Patient has no known allergies.  Family History  Problem Relation Age of Onset  . Breast cancer Maternal Aunt     Social History Social History  Substance Use Topics  . Smoking status: Never Smoker  . Smokeless tobacco: Never Used  . Alcohol use No    Review of Systems Constitutional: No fever/chills Eyes: No visual changes. ENT: No sore throat. Cardiovascular: Denies chest pain. Respiratory: Denies shortness of breath. Gastrointestinal: No abdominal pain.  No nausea, no vomiting.  No diarrhea.   Genitourinary: Negative for dysuria. Musculoskeletal: Negative for back pain. Skin: Negative for rash. Neurological: Negative for headaches, focal weakness or numbness.  ____________________________________________   PHYSICAL EXAM:  VITAL  SIGNS: ED Triage Vitals  Enc Vitals Group     BP 103/88     Pulse 101     Resp 22     Temp 98.3     Temp src      SpO2 95   Constitutional: Alert and oriented. Fatigued.  Eyes: Conjunctivae are normal.  ENT   Head: Normocephalic and atraumatic.   Nose: No congestion/rhinnorhea.   Mouth/Throat: Mucous membranes are moist.   Neck: No  stridor. Hematological/Lymphatic/Immunilogical: No cervical lymphadenopathy. Cardiovascular: Tachycardic, regular rhythm.  No murmurs, rubs, or gallops.  Respiratory: Normal respiratory effort without tachypnea nor retractions. Breath sounds are clear and equal bilaterally. No wheezes/rales/rhonchi. Gastrointestinal: Soft and non tender. No rebound. No guarding.  Genitourinary: Deferred Musculoskeletal: Normal range of motion in all extremities. No lower extremity edema. Neurologic:  Normal speech and language. No gross focal neurologic deficits are appreciated.  Skin:  Skin is warm, dry and intact. No rash noted. Psychiatric: Mood and affect are normal. Speech and behavior are normal. Patient exhibits appropriate insight and judgment.  ____________________________________________    LABS (pertinent positives/negatives)  Labs Reviewed  GLUCOSE, CAPILLARY - Abnormal; Notable for the following:       Result Value   Glucose-Capillary 123 (*)    All other components within normal limits  COMPREHENSIVE METABOLIC PANEL - Abnormal; Notable for the following:    CO2 20 (*)    Glucose, Bld 129 (*)    BUN 42 (*)    Creatinine, Ser 2.67 (*)    AST 46 (*)    GFR calc non Af Amer 19 (*)    GFR calc Af Amer 22 (*)    All other components within normal limits  CBC WITH DIFFERENTIAL/PLATELET  TROPONIN I  URINALYSIS, COMPLETE (UACMP) WITH MICROSCOPIC     ____________________________________________   EKG  I, Nance Pear, attending physician, personally viewed and interpreted this EKG  EKG Time: 1533 Rate: 102 Rhythm: sinus tachycardia Axis: right axis deviation Intervals: qtc 508 QRS: RBBB, LPFB ST changes: no st elevation Impression: abnormal ekg  ____________________________________________    RADIOLOGY  None   ____________________________________________   PROCEDURES  Procedures  ____________________________________________   INITIAL IMPRESSION / ASSESSMENT  AND PLAN / ED COURSE  Pertinent labs & imaging results that were available during my care of the patient were reviewed by me and considered in my medical decision making (see chart for details).  Patient presented to the emergency department today because of concerns for weakness. Patient's blood work was concerning for acute kidney injury. Creatinine was elevated to 2.6. Patient was given IV fluids here in the emergency department will be admitted to the hospitalist service.  ____________________________________________   FINAL CLINICAL IMPRESSION(S) / ED DIAGNOSES  Final diagnoses:  Weakness  Dehydration  AKI (acute kidney injury) ([place])     Note: This dictation was prepared with Dragon dictation. Any transcriptional errors that result from this process are unintentional     Nance Pear, MD 01/18/17 1640

## 2017-01-19 LAB — BASIC METABOLIC PANEL
Anion gap: 6 (ref 5–15)
BUN: 29 mg/dL — AB (ref 6–20)
CO2: 20 mmol/L — ABNORMAL LOW (ref 22–32)
CREATININE: 1.39 mg/dL — AB (ref 0.44–1.00)
Calcium: 8.2 mg/dL — ABNORMAL LOW (ref 8.9–10.3)
Chloride: 115 mmol/L — ABNORMAL HIGH (ref 101–111)
GFR calc Af Amer: 47 mL/min — ABNORMAL LOW (ref >60)
GFR, EST NON AFRICAN AMERICAN: 41 mL/min — AB (ref >60)
Glucose, Bld: 127 mg/dL — ABNORMAL HIGH (ref 65–99)
Potassium: 4.7 mmol/L (ref 3.5–5.1)
SODIUM: 141 mmol/L (ref 135–145)

## 2017-01-19 LAB — GLUCOSE, CAPILLARY
Glucose-Capillary: 119 mg/dL — ABNORMAL HIGH (ref 65–99)
Glucose-Capillary: 123 mg/dL — ABNORMAL HIGH (ref 65–99)

## 2017-01-19 MED ORDER — ENOXAPARIN SODIUM 40 MG/0.4ML ~~LOC~~ SOLN: 40.0000 mg | SUBCUTANEOUS | Status: DC

## 2017-01-19 NOTE — Progress Notes (Signed)
[name]    [name] was admitted to the Hospital on 01/18/2017 and Discharged  01/19/2017 and should be excused from work/school   for 3  days starting 01/18/2017 , may return to work/school without any restrictions.  Call Fritzi Mandes MD, [facility]  [phone] with questions.  [name] M.D on 01/19/2017,at 12:51 PM

## 2017-01-19 NOTE — Progress Notes (Signed)
Patient discharged home per MD order. All discharge instructions given and all questions answered. Patient verbalized understanding of all discharge instructions. 

## 2017-01-19 NOTE — Discharge Summary (Signed)
[facility] [place] NAME: Kemiya Batdorf    MR#:  [id number]  DATE OF BIRTH:  [birth year]  DATE OF ADMISSION:  01/18/2017 ADMITTING PHYSICIAN: Fritzi Mandes, MD  DATE OF DISCHARGE: 01/19/17  PRIMARY CARE PHYSICIAN: Maryland Pink, MD    ADMISSION DIAGNOSIS:  Dehydration [E86.0] Weakness [R53.1] AKI (acute kidney injury) ([place]) [N17.9]  DISCHARGE DIAGNOSIS:  Acute renal failure secondary to severe dehydration from poor by mouth intake improved Type 2 diabetes  SECONDARY DIAGNOSIS:   Past Medical History:  Diagnosis Date  . Arthritis   . Diabetes mellitus without complication [facility])     HOSPITAL COURSE:  Joetta Delprado  is a 61 y.o. female with a known history ofDiabetes and hypothyroidism comes in from [place] after she started not feeling well became very weak. Patient's husband said she ate lunch and thereafter put her head on the table and was not able to get up after that.  1. Acute renal failure due to severe dehydration -admit to medical floor -Received aggressive IV fluids -came in with creat of 2.67---1.3 -baseline crest 0.9 -I and o, avoid nephrotoxins -Patient feels a whole lot better. Requesting to go home.  2.DM-2 - SSI -resume home meds at discharge.  3. Hypothyroidism Synthroid  4. DVT prophylaxis Lovenox (renal dose)  Oral improved doing well will discharge to home. CONSULTS OBTAINED:    DRUG ALLERGIES:  No Known Allergies  DISCHARGE MEDICATIONS:   Current Discharge Medication List    CONTINUE these medications which have NOT CHANGED   Details  gabapentin (NEURONTIN) 100 MG capsule Take 200 mg by mouth 2 (two) times daily.     glipiZIDE (GLUCOTROL) 5 MG tablet Take 1 tablet by mouth daily.    levothyroxine (SYNTHROID, LEVOTHROID) 50 MCG tablet Take 50 mcg by mouth daily before breakfast. Take 30 to 60 minutes before meal.    metFORMIN (GLUCOPHAGE-XR) 500 MG 24 hr tablet Take 1,000 mg  by mouth daily.    simethicone (MYLICON) 80 MG chewable tablet Chew 80 mg by mouth every 6 (six) hours as needed for flatulence.    tolterodine (DETROL) 2 MG tablet Take 2 mg by mouth 2 (two) times daily.      STOP taking these medications     HYDROcodone-acetaminophen (NORCO/VICODIN) 5-325 MG tablet      ibuprofen (ADVIL,MOTRIN) 800 MG tablet      ketorolac (TORADOL) 10 MG tablet      ondansetron (ZOFRAN ODT) 4 MG disintegrating tablet         If you experience worsening of your admission symptoms, develop shortness of breath, life threatening emergency, suicidal or homicidal thoughts you must seek medical attention immediately by calling 911 or calling your MD immediately  if symptoms less severe.  You Must read complete instructions/literature along with all the possible adverse reactions/side effects for all the Medicines you take and that have been prescribed to you. Take any new Medicines after you have completely understood and accept all the possible adverse reactions/side effects.   Please note  You were cared for by a hospitalist during your hospital stay. If you have any questions about your discharge medications or the care you received while you were in the hospital after you are discharged, you can call the unit and asked to speak with the hospitalist on call if the hospitalist that took care of you is not available. Once you are discharged, your primary care physician will handle any further medical issues. Please note  that NO REFILLS for any discharge medications will be authorized once you are discharged, as it is imperative that you return to your primary care physician (or establish a relationship with a primary care physician if you do not have one) for your aftercare needs so that they can reassess your need for medications and monitor your lab values. Today   SUBJECTIVE  Physical lot better. Wants to go home. Little weak.   VITAL SIGNS:  Blood pressure (!)  150/59, pulse 76, temperature 97.8 F (36.6 C), temperature source Oral, resp. rate 20, height 5\' 6"  (1.676 m), weight 65.6 kg (144 lb 11.2 oz), SpO2 100 %.  I/O:   Intake/Output Summary (Last 24 hours) at 01/19/17 1249 Last data filed at 01/19/17 1015  Gross per 24 hour  Intake          2291.25 ml  Output             2750 ml  Net          -458.75 ml    PHYSICAL EXAMINATION:  GENERAL:  61 y.o.-year-old patient lying in the bed with no acute distress.  EYES: Pupils equal, round, reactive to light and accommodation. No scleral icterus. Extraocular muscles intact.  HEENT: Head atraumatic, normocephalic. Oropharynx and nasopharynx clear. Poor dentition NECK:  Supple, no jugular venous distention. No thyroid enlargement, no tenderness.  LUNGS: Normal breath sounds bilaterally, no wheezing, rales,rhonchi or crepitation. No use of accessory muscles of respiration.  CARDIOVASCULAR: S1, S2 normal. No murmurs, rubs, or gallops.  ABDOMEN: Soft, non-tender, non-distended. Bowel sounds present. No organomegaly or mass.  EXTREMITIES: No pedal edema, cyanosis, or clubbing.  NEUROLOGIC: Cranial nerves II through XII are intact. Muscle strength 5/5 in all extremities. Sensation intact. Gait not checked.  PSYCHIATRIC: The patient is alert and oriented x 3.  SKIN: No obvious rash, lesion, or ulcer.   DATA REVIEW:   CBC   Recent Labs Lab 01/18/17 1536  WBC 8.2  HGB 12.3  HCT 35.3  PLT 163    Chemistries   Recent Labs Lab 01/18/17 1536 01/19/17 0436  NA 143 141  K 4.9 4.7  CL 109 115*  CO2 20* 20*  GLUCOSE 129* 127*  BUN 42* 29*  CREATININE 2.67* 1.39*  CALCIUM 9.1 8.2*  AST 46*  --   ALT 20  --   ALKPHOS 94  --   BILITOT 0.4  --     Microbiology Results   No results found for this or any previous visit (from the past 240 hour(s)).  RADIOLOGY:  No results found.   Management plans discussed with the patient, family and they are in agreement.  CODE STATUS:     Code  Status Orders        Start     Ordered   01/18/17 1746  Full code  Continuous     01/18/17 1745    Code Status History    Date Active Date Inactive Code Status Order ID Comments User Context   11/04/2015 12:19 PM 11/06/2015  8:47 PM Full Code [id number]  Epifanio Lesches, MD ED      TOTAL TIME TAKING CARE OF THIS PATIENT: 40 minutes.    Kamani Lewter M.D on 01/19/2017 at 12:49 PM  Between 7am to 6pm - Pager - [phone] After 6pm go to [email or web address] - password [email or web address] [place] [facility]  Office  [phone]  CC: Primary care physician; Maryland Pink, MD

## 2017-03-02 NOTE — H&P (Signed)
Ms. Zuk is a 61 y.o. female here for [facility] and anterior colporrhaphy . For POP uterine descensus and anterior cystocele. Pt has not used vesicare yet . She did take 2 days of Flagyl and quit secondary to nausea and not thinking well . No SOB , No rash .  Scheduled for [facility] and anterior repair in Sept 2018. Using a ring #4 that she is able to remove 2x/ week  States that she is a " free bleeder" and has had 2 operations that she bleed after . No known coagulopathy .   Past Medical History:  has a past medical history of Diabetes mellitus type 2, uncomplicated (CMS-HCC); Diverticulitis (10/2016); Diverticulosis; and Hypothyroidism, unspecified.  Past Surgical History:  has a past surgical history that includes Right Tear Duct Surgery. Family History: family history includes Diabetes type II in her father and mother; Heart disease in her father and mother. Social History:  reports that she has never smoked. She has never used smokeless tobacco. She reports that she does not drink alcohol or use drugs. OB/GYN History:          OB History    Gravida Para Term Preterm AB Living   2 2       2    SAB TAB Ectopic Molar Multiple Live Births             2      Allergies: has No Known Allergies. Medications:  Current Outpatient Prescriptions:  .  gabapentin (NEURONTIN) 100 MG capsule, 2 po bid, Disp: 120 capsule, Rfl: 11 .  glipiZIDE (GLUCOTROL) 5 MG tablet, TAKE ONE TABLET BY MOUTH ONCE DAILY, Disp: 30 tablet, Rfl: 11 .  levothyroxine (SYNTHROID, LEVOTHROID) 75 MCG tablet, Take 1 tablet (75 mcg total) by mouth once daily. Take on an empty stomach with a glass of water at least 30-60 minutes before breakfast., Disp: 30 tablet, Rfl: 11 .  metFORMIN (GLUCOPHAGE-XR) 500 MG XR tablet, Take 2 tablets (1,000 mg total) by mouth once daily., Disp: 60 tablet, Rfl: 11 .  omeprazole (PRILOSEC) 20 MG DR capsule, Take 1 capsule (20 mg total) by mouth once daily., Disp: 30 capsule, Rfl: 5 .   tolterodine (DETROL) 2 MG tablet, 1-2 tabs po q day, Disp: 60 tablet, Rfl: 6 .  fluconazole (DIFLUCAN) 150 MG tablet, 1 tab po once a week for 4 weeks (Patient not taking: Reported on 01/13/2017 ), Disp: 4 tablet, Rfl: 0 .  ibuprofen (ADVIL,MOTRIN) 200 MG tablet, Take 200 mg by mouth once daily., Disp: , Rfl:  .  metroNIDAZOLE (METROGEL) 0.75 % vaginal gel, Place 1 applicator vaginally nightly., Disp: 70 g, Rfl: 0  Review of Systems: General:                      No fatigue or weight loss Eyes:                           No vision changes Ears:                            No hearing difficulty Respiratory:                No cough or shortness of breath Pulmonary:                  No asthma or shortness of breath Cardiovascular:  No chest pain, palpitations, dyspnea on exertion Gastrointestinal:          No abdominal bloating, chronic diarrhea, constipations, masses, pain or hematochezia Genitourinary:             No hematuria, dysuria, abnormal vaginal discharge, pelvic pain, Menometrorrhagia, + POP  Lymphatic:                   No swollen lymph nodes Musculoskeletal:         No muscle weakness Neurologic:                  No extremity weakness, syncope, seizure disorder Psychiatric:                  No history of depression, delusions or suicidal/homicidal ideation    Exam:      Vitals:   03/03/17 0916  BP: 110/50  Pulse: 76    Body mass index is 22.76 kg/m.  WDWN white/  female in NAD   Lungs: CTA  CV : RRR without murmur   Neck:  no thyromegaly Abdomen: soft , no mass, normal active bowel sounds,  non-tender, no rebound tenderness Pelvic: tanner stage 5 ,  External genitalia: vulva /labia no lesions Urethra: no prolapse Vagina: normal physiologic d/c, ring removed . No epithelium erosion   wet mount :+ BV . 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

## 2017-03-03 ENCOUNTER — Ambulatory Visit
Admission: RE | Admit: 2017-03-03 | Discharge: 2017-03-03 | Disposition: A | Discharge status: Home / Self Care | Payer: BLUE CROSS/BLUE SHIELD | Source: Ambulatory Visit | Attending: Obstetrics and Gynecology | Admitting: Obstetrics and Gynecology

## 2017-03-03 DIAGNOSIS — Z7984 Long term (current) use of oral hypoglycemic drugs: Secondary | ICD-10-CM | POA: Diagnosis not present

## 2017-03-03 DIAGNOSIS — K5792 Diverticulitis of intestine, part unspecified, without perforation or abscess without bleeding: Secondary | ICD-10-CM | POA: Diagnosis not present

## 2017-03-03 DIAGNOSIS — N814 Uterovaginal prolapse, unspecified: Secondary | ICD-10-CM | POA: Insufficient documentation

## 2017-03-03 DIAGNOSIS — Z8379 Family history of other diseases of the digestive system: Secondary | ICD-10-CM | POA: Diagnosis not present

## 2017-03-03 DIAGNOSIS — Z01812 Encounter for preprocedural laboratory examination: Secondary | ICD-10-CM | POA: Insufficient documentation

## 2017-03-03 DIAGNOSIS — Z833 Family history of diabetes mellitus: Secondary | ICD-10-CM | POA: Diagnosis not present

## 2017-03-03 DIAGNOSIS — Z9889 Other specified postprocedural states: Secondary | ICD-10-CM | POA: Diagnosis not present

## 2017-03-03 DIAGNOSIS — Z8249 Family history of ischemic heart disease and other diseases of the circulatory system: Secondary | ICD-10-CM | POA: Insufficient documentation

## 2017-03-03 DIAGNOSIS — Z79899 Other long term (current) drug therapy: Secondary | ICD-10-CM | POA: Insufficient documentation

## 2017-03-03 DIAGNOSIS — C119 Malignant neoplasm of nasopharynx, unspecified: Secondary | ICD-10-CM | POA: Diagnosis not present

## 2017-03-03 HISTORY — DX: Diverticulosis of intestine, part unspecified, without perforation or abscess without bleeding: K57.90

## 2017-03-03 HISTORY — DX: Hypothyroidism, unspecified: E03.9

## 2017-03-03 HISTORY — DX: Polyneuropathy, unspecified: G62.9

## 2017-03-03 HISTORY — DX: Disease of blood and blood-forming organs, unspecified: D75.9

## 2017-03-03 LAB — BASIC METABOLIC PANEL
Anion gap: 9 (ref 5–15)
BUN: 21 mg/dL — ABNORMAL HIGH (ref 6–20)
CHLORIDE: 104 mmol/L (ref 101–111)
CO2: 25 mmol/L (ref 22–32)
Calcium: 9.2 mg/dL (ref 8.9–10.3)
Creatinine, Ser: 1 mg/dL (ref 0.44–1.00)
GFR calc Af Amer: >60 mL/min (ref >60)
GFR calc non Af Amer: >60 mL/min (ref >60)
GLUCOSE: 242 mg/dL — AB (ref 65–99)
POTASSIUM: 3.9 mmol/L (ref 3.5–5.1)
Sodium: 138 mmol/L (ref 135–145)

## 2017-03-03 LAB — CBC
HEMATOCRIT: 35.6 % (ref 35.0–47.0)
Hemoglobin: 12.3 g/dL (ref 12.0–16.0)
MCH: 29.5 pg (ref 26.0–34.0)
MCHC: 34.7 g/dL (ref 32.0–36.0)
MCV: 85 fL (ref 80.0–100.0)
PLATELETS: 156 10*3/uL (ref 150–440)
RBC: 4.18 MIL/uL (ref 3.80–5.20)
RDW: 14.5 % (ref 11.5–14.5)
WBC: 5.9 10*3/uL (ref 3.6–11.0)

## 2017-03-03 LAB — TYPE AND SCREEN
ABO/RH(D): A POS
Antibody Screen: NEGATIVE

## 2017-03-03 NOTE — Patient Instructions (Signed)
  Your procedure is scheduled on: March 16, 2017 [facility]) Report to Same Day Surgery 2nd floor medical mall ([place] Entrance-take elevator on left to 2nd floor.  Check in with surgery information desk.) To find out your arrival time please call [phone] between 1PM - 3PM on March 13, 2017 (FRIDAY ) Remember: Instructions that are not followed completely may result in serious medical risk, up to and including death, or upon the discretion of your surgeon and anesthesiologist your surgery may need to be rescheduled.    _x___ 1. Do not eat food or drink liquids after midnight. No gum chewing or hard candies                                __x__ 2. No Alcohol for 24 hours before or after surgery.   __x__3. No Smoking for 24 prior to surgery.   ____  4. Bring all medications with you on the day of surgery if instructed.    __x__ 5. Notify your doctor if there is any change in your medical condition     (cold, fever, infections).     Do not wear jewelry, make-up, hairpins, clips or nail polish.  Do not wear lotions, powders, or perfumes.   Do not shave 48 hours prior to surgery. Men may shave face and neck.  Do not bring valuables to the hospital.    [facility] is not responsible for any belongings or valuables.               Contacts, dentures or bridgework may not be worn into surgery.  Leave your suitcase in the car. After surgery it may be brought to your room.  For patients admitted to the hospital, discharge time is determined by your treatment team                      Patients discharged the day of surgery will not be allowed to drive home.  You will need someone to drive you home and stay with you the night of your procedure.    Please read over the following fact sheets that you were given:   [facility] Preparing for Surgery and or MRSA Information   TAKE THE FOLLOWING MEDICATIONS WITH A SIP OF WATER THE MORNING OF SURGERY  1. GABAPENTIN   2.  LEVOTHYROXINE    __X__Fleets enema or Magnesium Citrate as directed.( FLEET ENEMA EARLY MONDAY MORNING, SEPTEMBER [place] )    _x___ Use CHG Soap or sage wipes as directed on instruction sheet   ____ Use inhalers on the day of surgery and bring to hospital day of surgery  __X__ Stop Metformin  2 days prior to surgery. (STOP METFORMIN ON SEPTEMBER 8 )   ____ Take 1/2 of usual insulin dose the night before surgery and none on the morning surgery.      _x___ Follow recommendations from Cardiologist, Pulmonologist or PCP regarding          stopping Aspirin, Coumadin, Plavix ,Eliquis, Effient, or Pradaxa, and Pletal.  X____Stop Anti-inflammatories such as Advil, Aleve, Ibuprofen, Motrin, Naproxen, Naprosyn, Goodies powders or aspirin products. OK to take Tylenol    _x___ Stop supplements until after surgery.  But may continue Vitamin D, Vitamin B,and multivitamin        ____ Bring C-Pap to the hospital.   X Friona

## 2017-03-04 MED ORDER — LACTATED RINGERS IV SOLN: INTRAVENOUS | Status: DC

## 2017-03-15 MED ORDER — CEFOXITIN SODIUM-DEXTROSE 2-2.2 GM-% IV SOLR (PREMIX)
2.0000 g | INTRAVENOUS | Status: AC
  Administered 2017-03-16: 2 g via INTRAVENOUS

## 2017-03-16 ENCOUNTER — Encounter
Admission: RE | Disposition: A | Discharge status: Home / Self Care | Payer: Self-pay | Source: Ambulatory Visit | Attending: Obstetrics and Gynecology

## 2017-03-16 ENCOUNTER — Ambulatory Visit: Payer: BLUE CROSS/BLUE SHIELD | Admitting: Anesthesiology

## 2017-03-16 ENCOUNTER — Observation Stay
Admission: RE | Admit: 2017-03-16 | Discharge: 2017-03-17 | Disposition: A | Discharge status: Home / Self Care | Payer: BLUE CROSS/BLUE SHIELD | Source: Ambulatory Visit | Attending: Obstetrics and Gynecology | Admitting: Obstetrics and Gynecology

## 2017-03-16 ENCOUNTER — Encounter: Payer: Self-pay | Admitting: *Deleted

## 2017-03-16 DIAGNOSIS — N813 Complete uterovaginal prolapse: Principal | ICD-10-CM | POA: Insufficient documentation

## 2017-03-16 DIAGNOSIS — Z9889 Other specified postprocedural states: Secondary | ICD-10-CM

## 2017-03-16 DIAGNOSIS — N72 Inflammatory disease of cervix uteri: Secondary | ICD-10-CM | POA: Diagnosis not present

## 2017-03-16 DIAGNOSIS — Z79899 Other long term (current) drug therapy: Secondary | ICD-10-CM | POA: Diagnosis not present

## 2017-03-16 DIAGNOSIS — N8189 Other female genital prolapse: Secondary | ICD-10-CM | POA: Insufficient documentation

## 2017-03-16 DIAGNOSIS — E039 Hypothyroidism, unspecified: Secondary | ICD-10-CM | POA: Diagnosis not present

## 2017-03-16 DIAGNOSIS — Z7984 Long term (current) use of oral hypoglycemic drugs: Secondary | ICD-10-CM | POA: Insufficient documentation

## 2017-03-16 DIAGNOSIS — E119 Type 2 diabetes mellitus without complications: Secondary | ICD-10-CM | POA: Insufficient documentation

## 2017-03-16 HISTORY — PX: CYSTOCELE REPAIR: SHX163

## 2017-03-16 HISTORY — PX: VAGINAL HYSTERECTOMY: SHX2639

## 2017-03-16 HISTORY — PX: BILATERAL SALPINGECTOMY: SHX5743

## 2017-03-16 LAB — ABO/RH: ABO/RH(D): A POS

## 2017-03-16 LAB — GLUCOSE, CAPILLARY
GLUCOSE-CAPILLARY: 168 mg/dL — AB (ref 65–99)
Glucose-Capillary: 166 mg/dL — ABNORMAL HIGH (ref 65–99)

## 2017-03-16 SURGERY — HYSTERECTOMY, VAGINAL
Anesthesia: General

## 2017-03-16 MED ORDER — SULFANILAMIDE 15 % VA CREA
TOPICAL_CREAM | VAGINAL | Status: AC
  Filled 2017-03-16: qty 120

## 2017-03-16 MED ORDER — LIDOCAINE-EPINEPHRINE (PF) 1 %-1:200000 IJ SOLN
INTRAMUSCULAR | Status: AC
  Filled 2017-03-16: qty 30

## 2017-03-16 MED ORDER — ONDANSETRON 4 MG PO TBDP: 4.0000 mg | ORAL_TABLET | Freq: Four times a day (QID) | ORAL | Status: DC | PRN

## 2017-03-16 MED ORDER — OXYCODONE-ACETAMINOPHEN 5-325 MG PO TABS
1.0000 | ORAL_TABLET | ORAL | Status: DC | PRN
  Administered 2017-03-16 – 2017-03-17 (×3): 1 via ORAL
  Filled 2017-03-16 (×3): qty 1

## 2017-03-16 MED ORDER — FENTANYL CITRATE (PF) 100 MCG/2ML IJ SOLN: 25.0000 ug | INTRAMUSCULAR | Status: DC | PRN

## 2017-03-16 MED ORDER — KETOROLAC TROMETHAMINE 30 MG/ML IJ SOLN
30.0000 mg | Freq: Three times a day (TID) | INTRAMUSCULAR | Status: DC
  Administered 2017-03-16 – 2017-03-17 (×3): 30 mg via INTRAVENOUS
  Filled 2017-03-16 (×4): qty 1

## 2017-03-16 MED ORDER — KETOROLAC TROMETHAMINE 30 MG/ML IJ SOLN
INTRAMUSCULAR | Status: DC | PRN
  Administered 2017-03-16: 30 mg via INTRAVENOUS

## 2017-03-16 MED ORDER — KETOROLAC TROMETHAMINE 30 MG/ML IJ SOLN
INTRAMUSCULAR | Status: AC
  Filled 2017-03-16: qty 1

## 2017-03-16 MED ORDER — HYDROMORPHONE HCL 1 MG/ML IJ SOLN
INTRAMUSCULAR | Status: DC | PRN
  Administered 2017-03-16 (×2): 0.5 mg via INTRAVENOUS

## 2017-03-16 MED ORDER — FLEET ENEMA 7-19 GM/118ML RE ENEM: 1.0000 | ENEMA | Freq: Once | RECTAL | Status: DC

## 2017-03-16 MED ORDER — ONDANSETRON HCL 4 MG/2ML IJ SOLN
4.0000 mg | Freq: Once | INTRAMUSCULAR | Status: DC | PRN
Start: 2017-03-16 — End: 2017-03-16

## 2017-03-16 MED ORDER — ACETAMINOPHEN 10 MG/ML IV SOLN
INTRAVENOUS | Status: DC | PRN
Start: 2017-03-16 — End: 2017-03-16
  Administered 2017-03-16: 1000 mg via INTRAVENOUS

## 2017-03-16 MED ORDER — PHENYLEPHRINE HCL 10 MG/ML IJ SOLN
INTRAMUSCULAR | Status: DC | PRN
  Administered 2017-03-16: 200 ug via INTRAVENOUS

## 2017-03-16 MED ORDER — MIDAZOLAM HCL 2 MG/2ML IJ SOLN
INTRAMUSCULAR | Status: DC | PRN
  Administered 2017-03-16: 2 mg via INTRAVENOUS

## 2017-03-16 MED ORDER — SODIUM CHLORIDE 0.9 % IJ SOLN
INTRAMUSCULAR | Status: AC
  Filled 2017-03-16: qty 10

## 2017-03-16 MED ORDER — SUCCINYLCHOLINE CHLORIDE 20 MG/ML IJ SOLN
INTRAMUSCULAR | Status: AC
  Filled 2017-03-16: qty 1

## 2017-03-16 MED ORDER — SUGAMMADEX SODIUM 200 MG/2ML IV SOLN
INTRAVENOUS | Status: DC | PRN
  Administered 2017-03-16: 150 mg via INTRAVENOUS

## 2017-03-16 MED ORDER — PHENYLEPHRINE HCL 10 MG/ML IJ SOLN
INTRAMUSCULAR | Status: AC
  Filled 2017-03-16: qty 1

## 2017-03-16 MED ORDER — DEXAMETHASONE SODIUM PHOSPHATE 10 MG/ML IJ SOLN
INTRAMUSCULAR | Status: DC | PRN
  Administered 2017-03-16: 10 mg via INTRAVENOUS

## 2017-03-16 MED ORDER — HYDROMORPHONE HCL 1 MG/ML IJ SOLN
INTRAMUSCULAR | Status: AC
  Filled 2017-03-16: qty 1

## 2017-03-16 MED ORDER — MIDAZOLAM HCL 2 MG/2ML IJ SOLN
INTRAMUSCULAR | Status: AC
  Filled 2017-03-16: qty 2

## 2017-03-16 MED ORDER — ONDANSETRON HCL 4 MG/2ML IJ SOLN
INTRAMUSCULAR | Status: DC | PRN
  Administered 2017-03-16: 4 mg via INTRAVENOUS

## 2017-03-16 MED ORDER — LIDOCAINE HCL (CARDIAC) 20 MG/ML IV SOLN
INTRAVENOUS | Status: DC | PRN
  Administered 2017-03-16: 80 mg via INTRAVENOUS

## 2017-03-16 MED ORDER — PROPOFOL 10 MG/ML IV BOLUS
INTRAVENOUS | Status: AC
  Filled 2017-03-16: qty 20

## 2017-03-16 MED ORDER — LIDOCAINE-EPINEPHRINE 1 %-1:100000 IJ SOLN
INTRAMUSCULAR | Status: AC
  Filled 2017-03-16: qty 1

## 2017-03-16 MED ORDER — EPHEDRINE SULFATE 50 MG/ML IJ SOLN
INTRAMUSCULAR | Status: DC | PRN
Start: 2017-03-16 — End: 2017-03-16
  Administered 2017-03-16: 5 mg via INTRAVENOUS

## 2017-03-16 MED ORDER — FENTANYL CITRATE (PF) 100 MCG/2ML IJ SOLN
INTRAMUSCULAR | Status: AC
  Filled 2017-03-16: qty 2

## 2017-03-16 MED ORDER — DEXAMETHASONE SODIUM PHOSPHATE 10 MG/ML IJ SOLN
INTRAMUSCULAR | Status: AC
  Filled 2017-03-16: qty 1

## 2017-03-16 MED ORDER — EPHEDRINE SULFATE 50 MG/ML IJ SOLN
INTRAMUSCULAR | Status: AC
  Filled 2017-03-16: qty 1

## 2017-03-16 MED ORDER — MORPHINE SULFATE (PF) 4 MG/ML IV SOLN: 2.0000 mg | INTRAVENOUS | Status: DC | PRN

## 2017-03-16 MED ORDER — FAMOTIDINE 20 MG PO TABS
20.0000 mg | ORAL_TABLET | Freq: Once | ORAL | Status: AC
  Administered 2017-03-16: 20 mg via ORAL

## 2017-03-16 MED ORDER — CEFOXITIN SODIUM-DEXTROSE 2-2.2 GM-% IV SOLR (PREMIX)
INTRAVENOUS | Status: AC
  Filled 2017-03-16: qty 50

## 2017-03-16 MED ORDER — ROCURONIUM BROMIDE 100 MG/10ML IV SOLN
INTRAVENOUS | Status: DC | PRN
  Administered 2017-03-16: 20 mg via INTRAVENOUS

## 2017-03-16 MED ORDER — LIDOCAINE HCL (PF) 2 % IJ SOLN
INTRAMUSCULAR | Status: AC
  Filled 2017-03-16: qty 2

## 2017-03-16 MED ORDER — PROPOFOL 10 MG/ML IV BOLUS
INTRAVENOUS | Status: DC | PRN
  Administered 2017-03-16: 150 mg via INTRAVENOUS

## 2017-03-16 MED ORDER — FENTANYL CITRATE (PF) 100 MCG/2ML IJ SOLN
INTRAMUSCULAR | Status: DC | PRN
  Administered 2017-03-16: 100 ug via INTRAVENOUS

## 2017-03-16 MED ORDER — ONDANSETRON HCL 4 MG/2ML IJ SOLN
INTRAMUSCULAR | Status: AC
  Filled 2017-03-16: qty 4

## 2017-03-16 MED ORDER — SODIUM CHLORIDE 0.9 % IV SOLN
INTRAVENOUS | Status: DC
  Administered 2017-03-16: 07:00:00 via INTRAVENOUS

## 2017-03-16 MED ORDER — ACETAMINOPHEN 500 MG PO TABS: 1000.0000 mg | ORAL_TABLET | Freq: Four times a day (QID) | ORAL | Status: DC | PRN

## 2017-03-16 MED ORDER — ACETAMINOPHEN 10 MG/ML IV SOLN
INTRAVENOUS | Status: AC
  Filled 2017-03-16: qty 100

## 2017-03-16 MED ORDER — FAMOTIDINE 20 MG PO TABS
ORAL_TABLET | ORAL | Status: AC
  Filled 2017-03-16: qty 1

## 2017-03-16 MED ORDER — SUCCINYLCHOLINE CHLORIDE 20 MG/ML IJ SOLN
INTRAMUSCULAR | Status: DC | PRN
  Administered 2017-03-16: 100 mg via INTRAVENOUS

## 2017-03-16 MED ORDER — LIDOCAINE-EPINEPHRINE 1 %-1:100000 IJ SOLN
INTRAMUSCULAR | Status: DC | PRN
Start: 2017-03-16 — End: 2017-03-16
  Administered 2017-03-16: 22 mL

## 2017-03-16 MED ORDER — ROCURONIUM BROMIDE 50 MG/5ML IV SOLN
INTRAVENOUS | Status: AC
  Filled 2017-03-16: qty 1

## 2017-03-16 MED ORDER — LACTATED RINGERS IV SOLN
INTRAVENOUS | Status: DC
  Administered 2017-03-16 – 2017-03-17 (×3): via INTRAVENOUS

## 2017-03-16 SURGICAL SUPPLY — 38 items
BAG URO DRAIN 2000ML W/SPOUT (MISCELLANEOUS) ×4 IMPLANT
CANISTER SUCT 1200ML W/VALVE (MISCELLANEOUS) ×4 IMPLANT
CATH FOLEY 2WAY  5CC 16FR (CATHETERS) ×2
CATH ROBINSON RED A/P 16FR (CATHETERS) ×4 IMPLANT
CATH URTH 16FR FL 2W BLN LF (CATHETERS) ×2 IMPLANT
DRAPE PERI LITHO V/GYN (MISCELLANEOUS) ×4 IMPLANT
DRAPE SURG 17X11 SM STRL (DRAPES) ×4 IMPLANT
DRAPE UNDER BUTTOCK W/FLU (DRAPES) ×4 IMPLANT
ELECT REM PT RETURN 9FT ADLT (ELECTROSURGICAL) ×4
ELECTRODE REM PT RTRN 9FT ADLT (ELECTROSURGICAL) ×2 IMPLANT
ETHIBOND 2 0 GREEN CT 2 30IN (SUTURE) IMPLANT
GAUZE PACK 2X3YD (MISCELLANEOUS) ×4 IMPLANT
GLOVE BIO SURGEON STRL SZ8 (GLOVE) ×16 IMPLANT
GOWN STRL REUS W/ TWL LRG LVL3 (GOWN DISPOSABLE) ×6 IMPLANT
GOWN STRL REUS W/ TWL XL LVL3 (GOWN DISPOSABLE) ×2 IMPLANT
GOWN STRL REUS W/TWL LRG LVL3 (GOWN DISPOSABLE) ×6
GOWN STRL REUS W/TWL XL LVL3 (GOWN DISPOSABLE) ×2
KIT RM TURNOVER CYSTO AR (KITS) ×4 IMPLANT
KIT RM TURNOVER STRD PROC AR (KITS) ×4 IMPLANT
LABEL OR SOLS (LABEL) ×4 IMPLANT
NDL SAFETY 22GX1.5 (NEEDLE) ×4 IMPLANT
NS IRRIG 500ML POUR BTL (IV SOLUTION) ×4 IMPLANT
PACK BASIN MINOR ARMC (MISCELLANEOUS) ×4 IMPLANT
PAD OB MATERNITY 4.3X12.25 (PERSONAL CARE ITEMS) ×4 IMPLANT
PAD PREP 24X41 OB/GYN DISP (PERSONAL CARE ITEMS) ×4 IMPLANT
SUT PDS 2-0 27IN (SUTURE) ×4 IMPLANT
SUT PDS AB 2-0 CT1 27 (SUTURE) IMPLANT
SUT VIC AB 0 CT1 27 (SUTURE) ×2
SUT VIC AB 0 CT1 27XCR 8 STRN (SUTURE) ×2 IMPLANT
SUT VIC AB 0 CT1 36 (SUTURE) ×16 IMPLANT
SUT VIC AB 2-0 CT1 36 (SUTURE) ×4 IMPLANT
SUT VIC AB 2-0 SH 27 (SUTURE) ×12
SUT VIC AB 2-0 SH 27XBRD (SUTURE) ×12 IMPLANT
SUT VIC AB 3-0 SH 27 (SUTURE)
SUT VIC AB 3-0 SH 27X BRD (SUTURE) IMPLANT
SYR CONTROL 10ML (SYRINGE) ×4 IMPLANT
SYRINGE 10CC LL (SYRINGE) ×4 IMPLANT
WATER STERILE IRR 1000ML POUR (IV SOLUTION) ×4 IMPLANT

## 2017-03-16 NOTE — Anesthesia Postprocedure Evaluation (Signed)
Anesthesia Post Note  Patient: [name]  Procedure(s) Performed: Procedure(s) (LRB): HYSTERECTOMY VAGINAL (N/A) BILATERAL SALPINGECTOMY (Bilateral) ANTERIOR REPAIR (CYSTOCELE) (N/A)  Patient location during evaluation: PACU Anesthesia Type: General Level of consciousness: awake and alert Pain management: pain level controlled Vital Signs Assessment: post-procedure vital signs reviewed and stable Respiratory status: spontaneous breathing and respiratory function stable Cardiovascular status: stable Anesthetic complications: no     Last Vitals:  Vitals:   03/16/17 0945 03/16/17 1000  BP: (!) 135/58 (!) 125/56  Pulse: 79 80  Resp: 13 12  Temp: (!) 36.2 C   SpO2: 99% 99%    Last Pain:  Vitals:   03/16/17 1000  TempSrc:   PainSc: Asleep                 KEPHART,WILLIAM K

## 2017-03-16 NOTE — Anesthesia Preprocedure Evaluation (Signed)
Anesthesia Evaluation  Patient identified by MRN, date of birth, ID band Patient awake    Reviewed: Allergy & Precautions, NPO status , Patient's Chart, lab work & pertinent test results  History of Anesthesia Complications Negative for: history of anesthetic complications  Airway Mallampati: III       Dental  (+) Poor Dentition, Chipped, Missing, Loose   Pulmonary neg sleep apnea, neg COPD,           Cardiovascular (-) hypertension(-) Past MI and (-) CHF (-) dysrhythmias (-) Valvular Problems/Murmurs     Neuro/Psych neg Seizures    GI/Hepatic Neg liver ROS, neg GERD  ,  Endo/Other  diabetes, Type 2, Oral Hypoglycemic Agents  Renal/GU negative Renal ROS     Musculoskeletal   Abdominal   Peds  Hematology   Anesthesia Other Findings   Reproductive/Obstetrics                             Anesthesia Physical Anesthesia Plan  ASA: III  Anesthesia Plan: General   Post-op Pain Management:    Induction:   PONV Risk Score and Plan: 3 and Ondansetron, Dexamethasone, Midazolam and Treatment may vary due to age or medical condition  Airway Management Planned: Oral ETT  Additional Equipment:   Intra-op Plan:   Post-operative Plan:   Informed Consent: I have reviewed the patients History and Physical, chart, labs and discussed the procedure including the risks, benefits and alternatives for the proposed anesthesia with the patient or authorized representative who has indicated his/her understanding and acceptance.     Plan Discussed with:   Anesthesia Plan Comments:         Anesthesia Quick Evaluation

## 2017-03-16 NOTE — Transfer of Care (Signed)
Immediate Anesthesia Transfer of Care Note  Patient: [name]  Procedure(s) Performed: Procedure(s): HYSTERECTOMY VAGINAL (N/A) BILATERAL SALPINGECTOMY (Bilateral) ANTERIOR REPAIR (CYSTOCELE) (N/A)  Patient Location: PACU  Anesthesia Type:General  Level of Consciousness: sedated  Airway & Oxygen Therapy: Patient Spontanous Breathing and Patient connected to face mask oxygen  Post-op Assessment: Report given to RN and Post -op Vital signs reviewed and stable  Post vital signs: Reviewed and stable  Last Vitals:  Vitals:   03/16/17 0622 03/16/17 0945  BP: 137/87 (!) 135/58  Pulse: 84 79  Resp: 18 13  Temp: 36.4 C (!) 36.2 C  SpO2: 99% 99%    Last Pain:  Vitals:   03/16/17 0622  TempSrc: Oral         Complications: No apparent anesthesia complications

## 2017-03-16 NOTE — Progress Notes (Signed)
Labs reviewed and NPO . Scheduled for [facility] and anterior repair  Ready for surgery

## 2017-03-16 NOTE — Brief Op Note (Signed)
03/16/2017  9:38 AM  PATIENT:  [name]  61 y.o. female  PRE-OPERATIVE DIAGNOSIS:  Cystocele  ,Uterine Descensus  POST-OPERATIVE DIAGNOSIS:  Cystocele  Uterine Descensus  PROCEDURE:  Procedure(s): HYSTERECTOMY VAGINAL (N/A) BILATERAL SALPINGECTOMY (Bilateral) ANTERIOR REPAIR (CYSTOCELE) (N/A)  SURGEON:  Surgeon(s) and Role:    * Schermerhorn, Gwen Her, MD - Primary    * Ward, Honor Loh, MD  Shirlee Limerick , [place] , [place] student    ANESTHESIA:   general  EBL:  Total I/O In: 800 [I.V.:800] Out: 200 [Urine:150; Blood:50]  BLOOD ADMINISTERED:none  DRAINS: Urinary Catheter (Foley)   LOCAL MEDICATIONS USED:  LIDOCAINE   SPECIMEN:  Source of Specimen:  cx , uterus and bilateral fallopian tubes   DISPOSITION OF SPECIMEN:  PATHOLOGY  COUNTS:  YES  TOURNIQUET:  * No tourniquets in log *  DICTATION: .Other Dictation: Dictation Number verbal   PLAN OF CARE: Admit for overnight observation  PATIENT DISPOSITION:  PACU - hemodynamically stable.   Delay start of Pharmacological VTE agent (>24hrs) due to surgical blood loss or risk of bleeding: not applicable

## 2017-03-16 NOTE — Anesthesia Post-op Follow-up Note (Signed)
Anesthesia QCDR form completed.        

## 2017-03-16 NOTE — Anesthesia Procedure Notes (Signed)
Procedure Name: Intubation Date/Time: 03/16/2017 7:37 AM Performed by: Johnna Acosta Pre-anesthesia Checklist: Patient identified, Emergency Drugs available, Suction available, Patient being monitored and Timeout performed Patient Re-evaluated:Patient Re-evaluated prior to induction Oxygen Delivery Method: Circle system utilized Preoxygenation: Pre-oxygenation with 100% oxygen Induction Type: IV induction Ventilation: Mask ventilation without difficulty Laryngoscope Size: Miller and 2 Grade View: Grade I Tube type: Oral Tube size: 7.0 mm Number of attempts: 1 Airway Equipment and Method: Stylet Placement Confirmation: ETT inserted through vocal cords under direct vision,  positive ETCO2 and breath sounds checked- equal and bilateral Secured at: 21 cm Tube secured with: Tape Dental Injury: Teeth and Oropharynx as per pre-operative assessment

## 2017-03-16 NOTE — Progress Notes (Signed)
Patient ID: [name], female   DOB: Nov 08, 1955, 61 y.o.   MRN: [id number] DOS   no c/o   PAin OK   VSS  good urine output  Voiding trial tomorrow

## 2017-03-16 NOTE — Op Note (Signed)
[name], [name] NO.:  [id number]  MEDICAL RECORD NO.:  [id number]  LOCATION:                                 FACILITY:  PHYSICIAN:  Laverta Baltimore, MDDATE OF BIRTH:  [birth year]  DATE OF PROCEDURE: DATE OF DISCHARGE:                              OPERATIVE REPORT   PREOPERATIVE DIAGNOSIS:  Pelvic organ prolapse with uterine descensus and anterior cystocele.  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  PROCEDURE PERFORMED: 1. Total vaginal hysterectomy. 2. Bilateral salpingectomy. 3. Anterior colporrhaphy.  SURGEON:  Laverta Baltimore, MD  FIRST ASSISTANT:  Chelsea Ward.  SECOND ASSISTANT:  Verdell Carmine, [place] student.  ANESTHESIA:  General endotracheal anesthesia.  INDICATIONS:  A 61 year old patient with symptomatic pelvic organ prolapse with noted uterine descensus, third-degree and cystocele second to third degree.  DESCRIPTION OF PROCEDURE:  After adequate general endotracheal anesthesia, the patient was placed in dorsal supine position, legs in the candy-cane stirrups.  Lower abdomen, perineum, and vagina were prepped and draped in normal sterile fashion.  Time-out was performed. Straight catheterization of the bladder yielded 100 mL of clear urine. A weighted speculum was placed in the posterior vaginal vault.  The anterior and the posterior cervix, which were grasped with thyroid tenacula.  The cervix was circumferentially injected with 0.5% lidocaine and 1:100,000 epinephrine.  A direct posterior colpotomy incision was made upon entry into the posterior cul-de-sac.  Uterosacral ligaments were bilaterally clamped, transected, and suture ligated with 0 Vicryl suture, tagged for later identification.  Anterior cervix was circumferentially incised with the Bovie.  Anterior cul-de-sac was entered sharply.  Cardinal ligaments were bilaterally clamped, transected, suture ligated with 0  Vicryl suture.  Uterine arteries were bilaterally clamped, transected, suture ligated with 0 Vicryl suture. Cornua were then clamped bilaterally and the cervix and uterus were delivered.  Each fallopian tube was identified and clamped and removed, and each pedicle was closed with 0 Vicryl suture.  Attention was then directed to the anterior vaginal epithelium, which was placed on tension centrally and injected with lidocaine-epinephrine solution from the vaginal cuff all the way to 1 cm inferior to the anterior urethral meatus, vaginal epithelium was opened and dissected off the bladder.  The cystocele was then reduced with mattress suture of 2-0 Vicryl suture.  Several interrupted mattress sutures were used with good reduction of tissue.  Vaginal tissues were trimmed and then reapproximated with a running 0 Vicryl suture.  The rest of the vaginal cuff was closed with 0 Vicryl suture.  Good hemostasis was noted.  A Foley catheter was placed at the end of the case yielding extra 50 mL of clear urine.  ESTIMATED BLOOD LOSS:  50 mL.  INTRAOPERATIVE FLUIDS:  800 mL.  URINE OUTPUT:  150 mL.  The patient tolerated the procedure well and was taken to recovery room in good condition.          ______________________________ Laverta Baltimore, MD     TS/MEDQ  D:  03/16/2017  T:  03/16/2017  Job:  [id number]

## 2017-03-17 ENCOUNTER — Encounter: Payer: Self-pay | Admitting: Obstetrics and Gynecology

## 2017-03-17 DIAGNOSIS — N813 Complete uterovaginal prolapse: Secondary | ICD-10-CM | POA: Diagnosis not present

## 2017-03-17 LAB — SURGICAL PATHOLOGY

## 2017-03-17 MED ORDER — OXYCODONE-ACETAMINOPHEN 5-325 MG PO TABS: 1.0000 | ORAL_TABLET | ORAL | 0 refills | Status: DC | PRN

## 2017-03-17 MED ORDER — IBUPROFEN 800 MG PO TABS: 800.0000 mg | ORAL_TABLET | Freq: Three times a day (TID) | ORAL | 0 refills | Status: DC | PRN

## 2017-03-17 MED ORDER — DOCUSATE SODIUM 100 MG PO CAPS: 100.0000 mg | ORAL_CAPSULE | Freq: Every day | ORAL | 2 refills | Status: AC | PRN

## 2017-03-17 NOTE — Discharge Summary (Signed)
Physician Discharge Summary  Patient ID: [name] MRN: [id number] DOB/AGE: 02-03-1956 61 y.o.  Admit date: 03/16/2017 Discharge date: 03/17/2017  Admission Diagnoses:POP   Discharge Diagnoses: same  Active Problems:   Post-operative state   Discharged Condition: good  Hospital Course: pt underwent an uncomplicated  TVH , bilat salpingectomy and anterior repair . Post op passed voiding trial .  Consults: None  Significant Diagnostic Studies: labs: No results found for this or any previous visit (from the past 24 hour(s)).  Treatments: surgery: as above  Discharge Exam: Blood pressure (!) 109/56, pulse 69, temperature (!) 97.4 F (36.3 C), temperature source Oral, resp. rate 18, height 5\' 6"  (1.676 m), weight 65.3 kg (144 lb), SpO2 97 %. General appearance: alert and cooperative Head: Normocephalic, without obvious abnormality, atraumatic Lungs CTA CV RRR  Disposition: 01-Home or Self Care  Discharge Instructions    Call MD for:  difficulty breathing, headache or visual disturbances    Complete by:  As directed    Call MD for:  extreme fatigue    Complete by:  As directed    Call MD for:  hives    Complete by:  As directed    Call MD for:  persistant dizziness or light-headedness    Complete by:  As directed    Call MD for:  persistant nausea and vomiting    Complete by:  As directed    Call MD for:  redness, tenderness, or signs of infection (pain, swelling, redness, odor or green/yellow discharge around incision site)    Complete by:  As directed    Call MD for:  severe uncontrolled pain    Complete by:  As directed    Call MD for:  temperature >100.4    Complete by:  As directed    Diet - low sodium heart healthy    Complete by:  As directed    Increase activity slowly    Complete by:  As directed      Allergies as of 03/17/2017   No Known Allergies     Medication List    TAKE these medications   docusate sodium 100 MG capsule Commonly known as:   COLACE Take 1 capsule (100 mg total) by mouth daily as needed.   gabapentin 100 MG capsule Commonly known as:  NEURONTIN Take 200 mg by mouth 2 (two) times daily.   glipiZIDE 5 MG tablet Commonly known as:  GLUCOTROL Take 5 mg by mouth daily.   ibuprofen 800 MG tablet Commonly known as:  ADVIL,MOTRIN Take 1 tablet (800 mg total) by mouth every 8 (eight) hours as needed.   levothyroxine 50 MCG tablet Commonly known as:  SYNTHROID, LEVOTHROID Take 50 mcg by mouth daily before breakfast. Take 30 to 60 minutes before meal.   metFORMIN 500 MG 24 hr tablet Commonly known as:  GLUCOPHAGE-XR Take 1,000 mg by mouth daily.   oxyCODONE-acetaminophen 5-325 MG tablet Commonly known as:  PERCOCET/ROXICET Take 1 tablet by mouth every 4 (four) hours as needed for moderate pain.   tolterodine 2 MG tablet Commonly known as:  DETROL Take 2 mg by mouth daily.            Discharge Care Instructions        Start     Ordered   03/17/17 0000  oxyCODONE-acetaminophen (PERCOCET/ROXICET) 5-325 MG tablet  Every 4 hours PRN     03/17/17 1153   03/17/17 0000  ibuprofen (ADVIL,MOTRIN) 800 MG tablet  Every 8 hours PRN  03/17/17 1153   03/17/17 0000  Increase activity slowly     03/17/17 1153   03/17/17 0000  Diet - low sodium heart healthy     03/17/17 1153   03/17/17 0000  Call MD for:  extreme fatigue     03/17/17 1153   03/17/17 0000  Call MD for:  persistant dizziness or light-headedness     03/17/17 1153   03/17/17 0000  Call MD for:  hives     03/17/17 1153   03/17/17 0000  Call MD for:  difficulty breathing, headache or visual disturbances     03/17/17 1153   03/17/17 0000  Call MD for:  redness, tenderness, or signs of infection (pain, swelling, redness, odor or green/yellow discharge around incision site)     03/17/17 1153   03/17/17 0000  Call MD for:  severe uncontrolled pain     03/17/17 1153   03/17/17 0000  Call MD for:  persistant nausea and vomiting     03/17/17 1153    03/17/17 0000  Call MD for:  temperature >100.4     03/17/17 1153   03/17/17 0000  docusate sodium (COLACE) 100 MG capsule  Daily PRN     03/17/17 1154     Follow-up Information    Omri Bertran, Gwen Her, MD Follow up in 2 week(s).   Specialty:  Obstetrics and Gynecology Why:  post o[p Contact information: [address] [place] [address] [phone]           Signed: Gwen Her Jovi Alvizo 03/17/2017, 11:55 AM

## 2017-03-17 NOTE — Progress Notes (Signed)
Patient discharged home. Discharge instructions, prescriptions and follow up appointment given to and reviewed with patient. Patient verbalized understanding. Patient wheeled out by RN 

## 2017-10-25 ENCOUNTER — Emergency Department
Admission: EM | Admit: 2017-10-25 | Discharge: 2017-10-25 | Disposition: A | Discharge status: Home / Self Care | Payer: BLUE CROSS/BLUE SHIELD | Attending: Emergency Medicine | Admitting: Emergency Medicine

## 2017-10-25 ENCOUNTER — Emergency Department: Payer: BLUE CROSS/BLUE SHIELD

## 2017-10-25 ENCOUNTER — Other Ambulatory Visit: Payer: Self-pay

## 2017-10-25 ENCOUNTER — Encounter: Payer: Self-pay | Admitting: Emergency Medicine

## 2017-10-25 DIAGNOSIS — Z79899 Other long term (current) drug therapy: Secondary | ICD-10-CM | POA: Diagnosis not present

## 2017-10-25 DIAGNOSIS — K5792 Diverticulitis of intestine, part unspecified, without perforation or abscess without bleeding: Secondary | ICD-10-CM

## 2017-10-25 DIAGNOSIS — E119 Type 2 diabetes mellitus without complications: Secondary | ICD-10-CM | POA: Insufficient documentation

## 2017-10-25 DIAGNOSIS — K5732 Diverticulitis of large intestine without perforation or abscess without bleeding: Secondary | ICD-10-CM | POA: Insufficient documentation

## 2017-10-25 DIAGNOSIS — Z7984 Long term (current) use of oral hypoglycemic drugs: Secondary | ICD-10-CM | POA: Insufficient documentation

## 2017-10-25 DIAGNOSIS — E039 Hypothyroidism, unspecified: Secondary | ICD-10-CM | POA: Insufficient documentation

## 2017-10-25 DIAGNOSIS — R109 Unspecified abdominal pain: Secondary | ICD-10-CM | POA: Diagnosis present

## 2017-10-25 LAB — COMPREHENSIVE METABOLIC PANEL
ALK PHOS: 90 U/L (ref 38–126)
ALT: 18 U/L (ref 14–54)
ANION GAP: 7 (ref 5–15)
AST: 26 U/L (ref 15–41)
Albumin: 3.8 g/dL (ref 3.5–5.0)
BUN: 46 mg/dL — ABNORMAL HIGH (ref 6–20)
CO2: 23 mmol/L (ref 22–32)
Calcium: 8.7 mg/dL — ABNORMAL LOW (ref 8.9–10.3)
Chloride: 105 mmol/L (ref 101–111)
Creatinine, Ser: 1.56 mg/dL — ABNORMAL HIGH (ref 0.44–1.00)
GFR, EST AFRICAN AMERICAN: 41 mL/min — AB (ref >60)
GFR, EST NON AFRICAN AMERICAN: 35 mL/min — AB (ref >60)
Glucose, Bld: 153 mg/dL — ABNORMAL HIGH (ref 65–99)
Potassium: 4.7 mmol/L (ref 3.5–5.1)
SODIUM: 135 mmol/L (ref 135–145)
Total Bilirubin: 0.6 mg/dL (ref 0.3–1.2)
Total Protein: 8 g/dL (ref 6.5–8.1)

## 2017-10-25 LAB — CBC WITH DIFFERENTIAL/PLATELET
Basophils Absolute: 0 10*3/uL (ref 0–0.1)
Basophils Relative: 0 %
EOS PCT: 3 %
Eosinophils Absolute: 0.3 10*3/uL (ref 0–0.7)
HCT: 34.5 % — ABNORMAL LOW (ref 35.0–47.0)
HEMOGLOBIN: 11.9 g/dL — AB (ref 12.0–16.0)
LYMPHS ABS: 1.3 10*3/uL (ref 1.0–3.6)
Lymphocytes Relative: 12 %
MCH: 28.9 pg (ref 26.0–34.0)
MCHC: 34.4 g/dL (ref 32.0–36.0)
MCV: 83.9 fL (ref 80.0–100.0)
MONOS PCT: 8 %
Monocytes Absolute: 0.9 10*3/uL (ref 0.2–0.9)
Neutro Abs: 8.4 10*3/uL — ABNORMAL HIGH (ref 1.4–6.5)
Neutrophils Relative %: 77 %
PLATELETS: 176 10*3/uL (ref 150–440)
RBC: 4.11 MIL/uL (ref 3.80–5.20)
RDW: 14 % (ref 11.5–14.5)
WBC: 10.9 10*3/uL (ref 3.6–11.0)

## 2017-10-25 LAB — LIPASE, BLOOD: LIPASE: 51 U/L (ref 11–51)

## 2017-10-25 LAB — ETHANOL: Alcohol, Ethyl (B): <10 mg/dL (ref <10)

## 2017-10-25 LAB — LACTIC ACID, PLASMA: LACTIC ACID, VENOUS: 1.7 mmol/L (ref 0.5–1.9)

## 2017-10-25 LAB — TROPONIN I

## 2017-10-25 MED ORDER — AMOXICILLIN-POT CLAVULANATE 875-125 MG PO TABS: 1.0000 | ORAL_TABLET | Freq: Two times a day (BID) | ORAL | 0 refills | Status: AC

## 2017-10-25 MED ORDER — IOPAMIDOL (ISOVUE-300) INJECTION 61%
75.0000 mL | Freq: Once | INTRAVENOUS | Status: AC | PRN
  Administered 2017-10-25: 75 mL via INTRAVENOUS

## 2017-10-25 MED ORDER — IOPAMIDOL (ISOVUE-300) INJECTION 61%
30.0000 mL | Freq: Once | INTRAVENOUS | Status: AC | PRN
Start: 2017-10-25 — End: 2017-10-25
  Administered 2017-10-25: 30 mL via ORAL

## 2017-10-25 MED ORDER — OXYCODONE-ACETAMINOPHEN 5-325 MG PO TABS
1.0000 | ORAL_TABLET | Freq: Once | ORAL | Status: AC
  Administered 2017-10-25: 1 via ORAL
  Filled 2017-10-25: qty 1

## 2017-10-25 MED ORDER — ONDANSETRON HCL 4 MG PO TABS: 4.0000 mg | ORAL_TABLET | Freq: Three times a day (TID) | ORAL | 0 refills | Status: DC | PRN

## 2017-10-25 MED ORDER — SODIUM CHLORIDE 0.9 % IV BOLUS
1000.0000 mL | Freq: Once | INTRAVENOUS | Status: AC
  Administered 2017-10-25: 1000 mL via INTRAVENOUS

## 2017-10-25 MED ORDER — OXYCODONE-ACETAMINOPHEN 5-325 MG PO TABS: 1.0000 | ORAL_TABLET | ORAL | 0 refills | Status: DC | PRN

## 2017-10-25 MED ORDER — PIPERACILLIN-TAZOBACTAM 3.375 G IVPB 30 MIN
3.3750 g | Freq: Once | INTRAVENOUS | Status: AC
  Administered 2017-10-25: 3.375 g via INTRAVENOUS
  Filled 2017-10-25: qty 50

## 2017-10-25 NOTE — ED Notes (Signed)
Patient transported to CT 

## 2017-10-25 NOTE — ED Triage Notes (Addendum)
Pt ems from home with abd pain x 10 days. Pt also with dry cough and dry oral mucosa. Pt given 100 mcg fentanyl (50/50) by ems, last dose at 0800. cbg ems 161. Last bm 2 days ago.

## 2017-10-25 NOTE — ED Provider Notes (Addendum)
[facility] Emergency Department Provider Note  ____________________________________________   I have reviewed the triage vital signs and the nursing notes. Where available I have reviewed prior notes and, if possible and indicated, outside hospital notes.    HISTORY  Chief Complaint No chief complaint on file.    HPI [name] is a 62 y.o. female who presents today complaining of abdominal pain.  Patient states she has chronic recurrent abdominal pain.  She does also a history of diverticulitis.  She denies recent surgery.  She states she has been having abdominal pain for the last 10 days, she has been having mostly constipation she thinks her last bowel movement was 5 days ago.  She denies any melena or bright red blood per rectum or hematemesis.  She is not endorsing vomiting.  States she has been able to eat up until recently.  She denies any fever or chills.  Patient had 100 mcg of fentanyl on the way in from EMS which makes her pain better.  Is a diffuse abdominal discomfort, it is like a band across the top but also also pain across the bottom.  No exertional symptoms or chest pain.  Patient is somewhat of a poor historian after the fentanyl.    Past Medical History:  Diagnosis Date  . Arthritis   . Blood dyscrasia    patient stated she is a "free bleeder"  . Diabetes mellitus without complication ([place])   . Diverticulosis   . Hypothyroidism   . Neuropathy     Patient Active Problem List   Diagnosis Date Noted  . Post-operative state 03/16/2017  . Acute renal failure (ARF) ([place]) 01/18/2017  . Acute respiratory failure with hypoxia ([place]) 11/04/2015    Past Surgical History:  Procedure Laterality Date  . BILATERAL SALPINGECTOMY Bilateral 03/16/2017   Procedure: BILATERAL SALPINGECTOMY;  Surgeon: Schermerhorn, Gwen Her, MD;  Location: [facility] ORS;  Service: Gynecology;  Laterality: Bilateral;  . CYSTOCELE REPAIR N/A 03/16/2017   Procedure:  ANTERIOR REPAIR (CYSTOCELE);  Surgeon: Schermerhorn, Gwen Her, MD;  Location: [facility] ORS;  Service: Gynecology;  Laterality: N/A;  . TEAR DUCT PROBING Right   . TUBAL LIGATION    . VAGINAL HYSTERECTOMY N/A 03/16/2017   Procedure: HYSTERECTOMY VAGINAL;  Surgeon: Schermerhorn, Gwen Her, MD;  Location: [facility] ORS;  Service: Gynecology;  Laterality: N/A;  . WISDOM TOOTH EXTRACTION      Prior to Admission medications   Medication Sig Start Date End Date Taking? Authorizing Provider  docusate sodium (COLACE) 100 MG capsule Take 1 capsule (100 mg total) by mouth daily as needed. 03/17/17 03/17/18  Schermerhorn, Gwen Her, MD  gabapentin (NEURONTIN) 100 MG capsule Take 200 mg by mouth 2 (two) times daily.  08/16/15   [provider]  glipiZIDE (GLUCOTROL) 5 MG tablet Take 5 mg by mouth daily.  06/02/16   [provider]  ibuprofen (ADVIL,MOTRIN) 800 MG tablet Take 1 tablet (800 mg total) by mouth every 8 (eight) hours as needed. 03/17/17   Schermerhorn, Gwen Her, MD  levothyroxine (SYNTHROID, LEVOTHROID) 50 MCG tablet Take 50 mcg by mouth daily before breakfast. Take 30 to 60 minutes before meal. 08/31/15   [provider]  metFORMIN (GLUCOPHAGE-XR) 500 MG 24 hr tablet Take 1,000 mg by mouth daily.  08/31/15   [provider]  oxyCODONE-acetaminophen (PERCOCET/ROXICET) 5-325 MG tablet Take 1 tablet by mouth every 4 (four) hours as needed for moderate pain. 03/17/17   Schermerhorn, Gwen Her, MD  tolterodine (DETROL) 2 MG  tablet Take 2 mg by mouth daily.     [provider]    Allergies Patient has no known allergies.  Family History  Problem Relation Age of Onset  . Breast cancer Maternal Aunt   . Hypertension Mother   . Hypertension Father   . Diabetes Father     Social History Social History   Tobacco Use  . Smoking status: Never Smoker  . Smokeless tobacco: Never Used  Substance Use Topics  . Alcohol use: No  . Drug use: No    Review of  Systems Constitutional: No fever/chills Eyes: No visual changes. ENT: No sore throat. No stiff neck no neck pain Cardiovascular: Denies chest pain. Respiratory: Denies shortness of breath. Gastrointestinal:   no vomiting.  No diarrhea.  + constipation. Genitourinary: Negative for dysuria. Musculoskeletal: Negative lower extremity swelling Skin: Negative for rash. Neurological: Negative for severe headaches, focal weakness or numbness.   ____________________________________________   PHYSICAL EXAM:  VITAL SIGNS: ED Triage Vitals  Enc Vitals Group     BP      Pulse      Resp      Temp      Temp src      SpO2      Weight      Height      Head Circumference      Peak Flow      Pain Score      Pain Loc      Pain Edu?      Excl. in [place]?     Constitutional: Alert and oriented. Well appearing and in no acute distress. Eyes: Conjunctivae are normal Head: Atraumatic HEENT: No congestion/rhinnorhea. Mucous membranes are dry.  Oropharynx non-erythematous Neck:   Nontender with no meningismus, no masses, no stridor Cardiovascular: Normal rate, regular rhythm. Grossly normal heart sounds.  Good peripheral circulation. Respiratory: Normal respiratory effort.  No retractions. Lungs CTAB. Abdominal: Soft and acutely tender more in the left side than the right left lower quadrant specifically. No distention.  Voluntary guarding no rebound Back:  There is no focal tenderness or step off.  there is no midline tenderness there are no lesions noted. there is no CVA tenderness Musculoskeletal: No lower extremity tenderness, no upper extremity tenderness. No joint effusions, no DVT signs strong distal pulses no edema Neurologic:  Normal speech and language. No gross focal neurologic deficits are appreciated.  Skin:  Skin is warm, dry and intact. No rash noted. Psychiatric: Mood and affect are normal. Speech and behavior are normal.  ____________________________________________    LABS (all labs ordered are listed, but only abnormal results are displayed)  Labs Reviewed  URINALYSIS, COMPLETE (UACMP) WITH MICROSCOPIC  CBC WITH DIFFERENTIAL/PLATELET  COMPREHENSIVE METABOLIC PANEL  LIPASE, BLOOD    Pertinent labs  results that were available during my care of the patient were reviewed by me and considered in my medical decision making (see chart for details). ____________________________________________  EKG  I personally interpreted any EKGs ordered by me or triage Sinus tach rate 106, right bundle branch block, normal axis no acute ischemia ____________________________________________  RADIOLOGY  Pertinent labs & imaging results that were available during my care of the patient were reviewed by me and considered in my medical decision making (see chart for details). If possible, patient and/or family made aware of any abnormal findings.  No results found. ____________________________________________    PROCEDURES  Procedure(s) performed: None  Procedures  Critical Care performed: None  ____________________________________________   INITIAL IMPRESSION /  ASSESSMENT AND PLAN / ED COURSE  Pertinent labs & imaging results that were available during my care of the patient were reviewed by me and considered in my medical decision making (see chart for details).  Patient here with chronic recurrent abdominal pain history of diverticulosis, did have a hysterectomy in 2018, September, has a history of dehydration and AK I in the past as well as diverticular disease.  We will give her IV fluid as she appears dehydrated, will obtain a CT scan of her abdomen, we will continue to monitor her closely including check appropriate blood work.  Low suspicion for ACS but will check an EKG.     ----------------------------------------- 10:54 AM on 10/25/2017 -----------------------------------------  Scan shows uncomplicated diverticulitis no peripheral abscess,  white count is reassuring blood work is reassuring, we will give her Zosyn here, and I will also give her oral pain medications.  She does not have to stay in the hospital.  Creatinine is somewhat elevated we are giving her a liter of fluid and I think that will certainly help.  We will ensure that she can tolerate p.o.  Given the choice, it is Easter Sunday and patient very much would prefer to go home and not be admitted to the hospital.  We will see how she feels after antibiotics oral pain medication and ensure that she can tolerate p.o. and ambulate etc.  If she does elect to go home which is at this time her choice, she understands extensive and thorough precautions and follow-up instructions     ----------------------------------------- 1:02 PM on 10/25/2017 -----------------------------------------  Again I did talk to the patient she has not required pain medication while she has been here, her strong preference is to be discharged with uncomplicated diverticulitis, family and patient are all in agreement with this plan they would prefer not to be admitted despite the fact that I did offer it, we will discharge her with Augmentin, pain medication and extensive return precautions ____________________________________________   FINAL CLINICAL IMPRESSION(S) / ED DIAGNOSES  Final diagnoses:  None      This chart was dictated using voice recognition software.  Despite best efforts to proofread,  errors can occur which can change meaning.      Schuyler Amor, MD 10/25/17 [id number]    Schuyler Amor, MD 10/25/17 6967    Schuyler Amor, MD 10/25/17 1055    Schuyler Amor, MD 10/25/17 1302

## 2017-10-25 NOTE — Discharge Instructions (Addendum)
Prefer to go home then be admitted to the hospital this is not unreasonable but if you have new or worrisome symptoms including increased pain, fever, or vomiting, return to the emergency room right away.

## 2017-10-25 NOTE — ED Notes (Signed)
Pt having diarrhea at this time. Unable to obtain stool specimen d/t contamination with urine.

## 2017-10-25 NOTE — ED Notes (Signed)
Pt resting comfortably at this time. Pt O2 dropping to 92% when pain increases. This RN instructed and coached pt not to hold breath when she is in pain. O2 rebounded to 95-96%. Effective teachback. Pt done drinking her contrast -- this RN called CT to inform them she is ready. CT waiting on BMP to result and will come get her for scan.

## 2017-11-17 IMAGING — MG MM DIAG BREAST TOMO BILATERAL
9 of 12 series · 9 of 28 positions shown · non-contrast
Comparison: 10/05/2013 (left), 04/06/2013 (left), 03/15/2013
(bilateral) and earlier.

ACR Breast Density Category a: The breast tissue is almost entirely
fatty.

CLINICAL DATA: Greater than 2 year interval followup of likely
benign left breast mass, possibly intramammary node. Annual
evaluation, right breast.

EXAM:
DIGITAL DIAGNOSTIC BILATERAL MAMMOGRAM WITH 3D TOMOSYNTHESIS AND CAD

[R CC synth-2D]
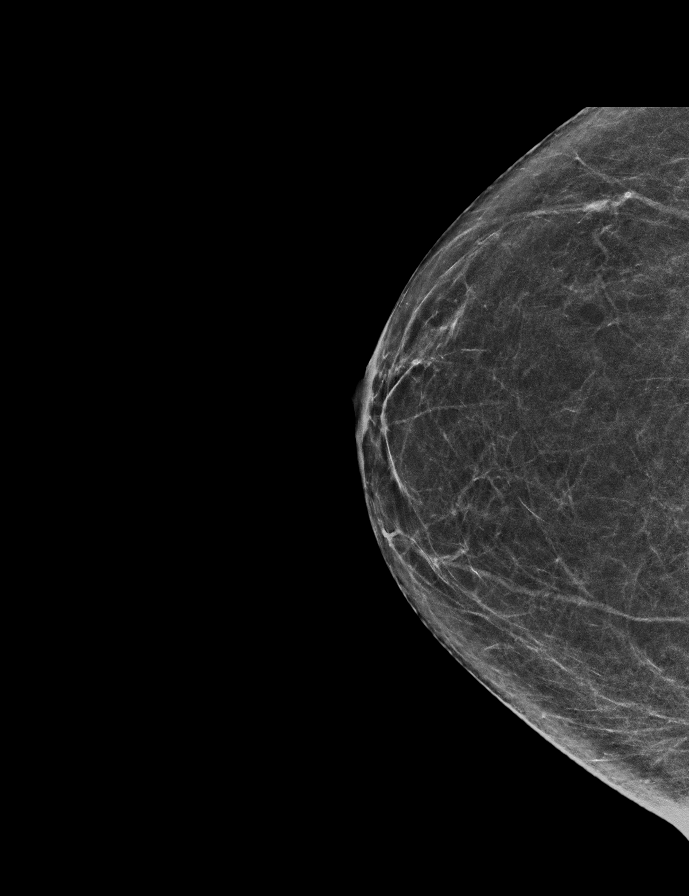

[R MLO synth-2D]
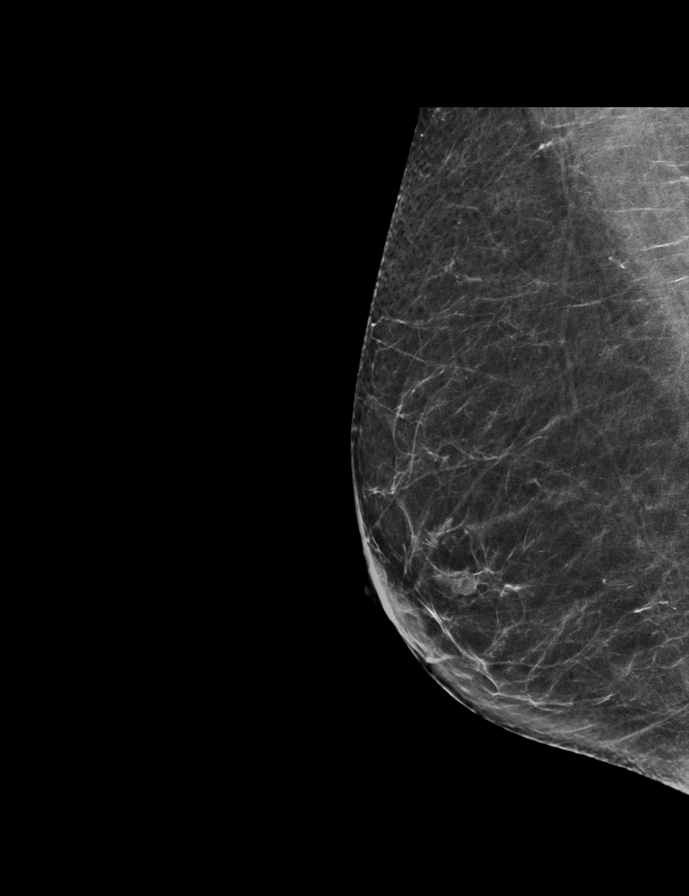

[L CC synth-2D]
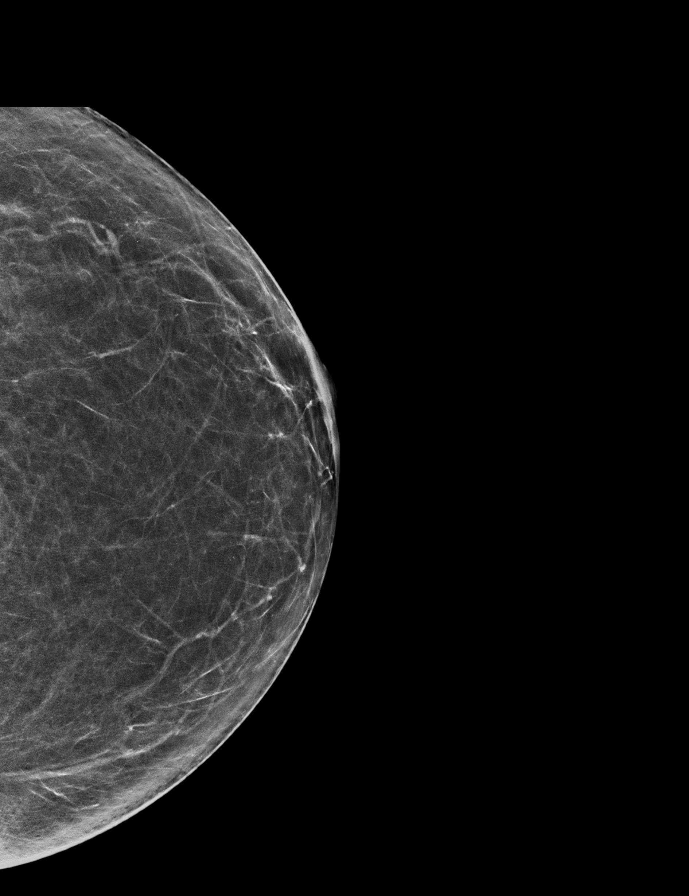

[L CC]
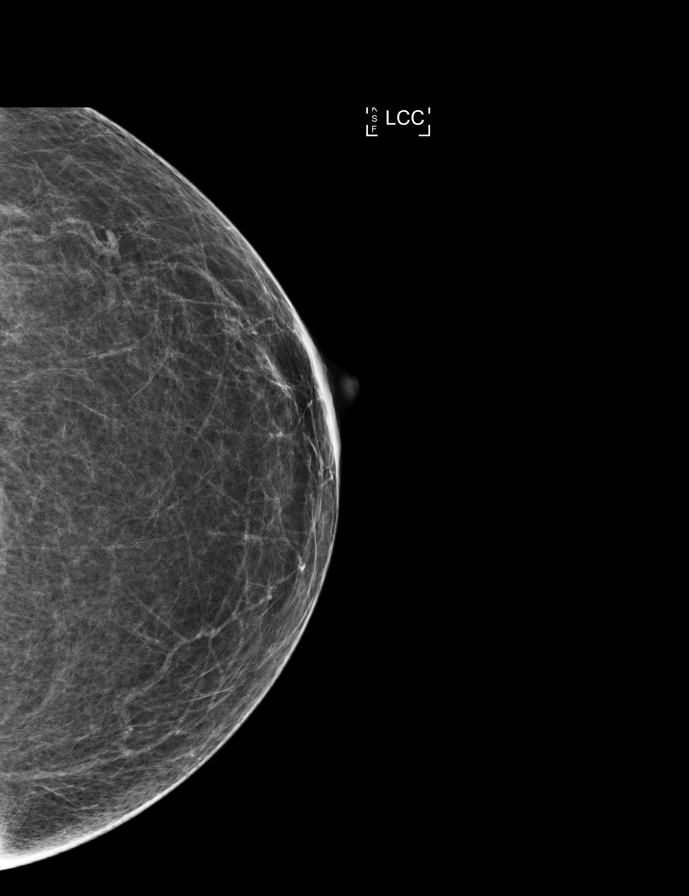

[R CC]
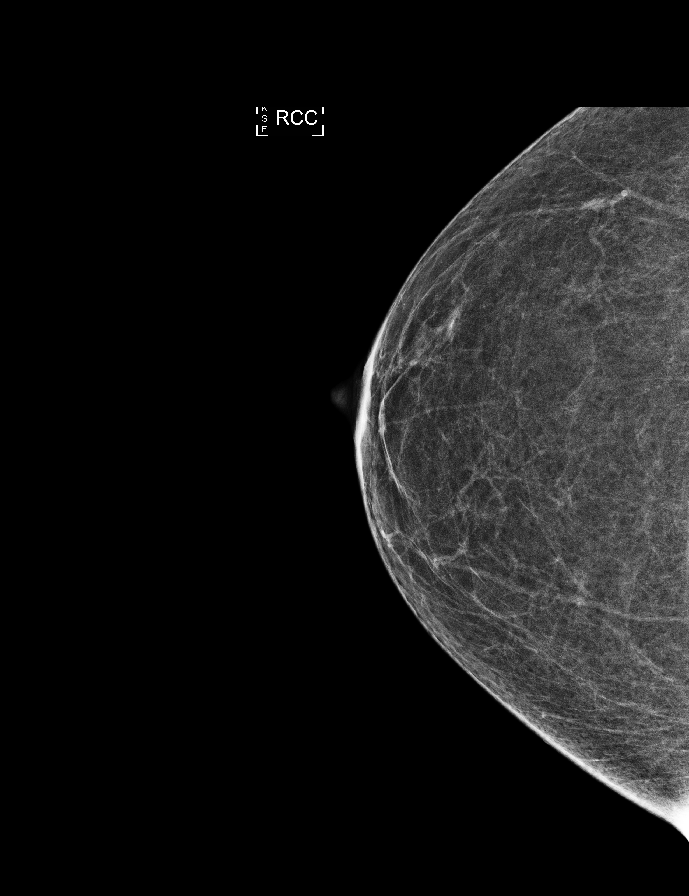

[L MLO synth-2D]
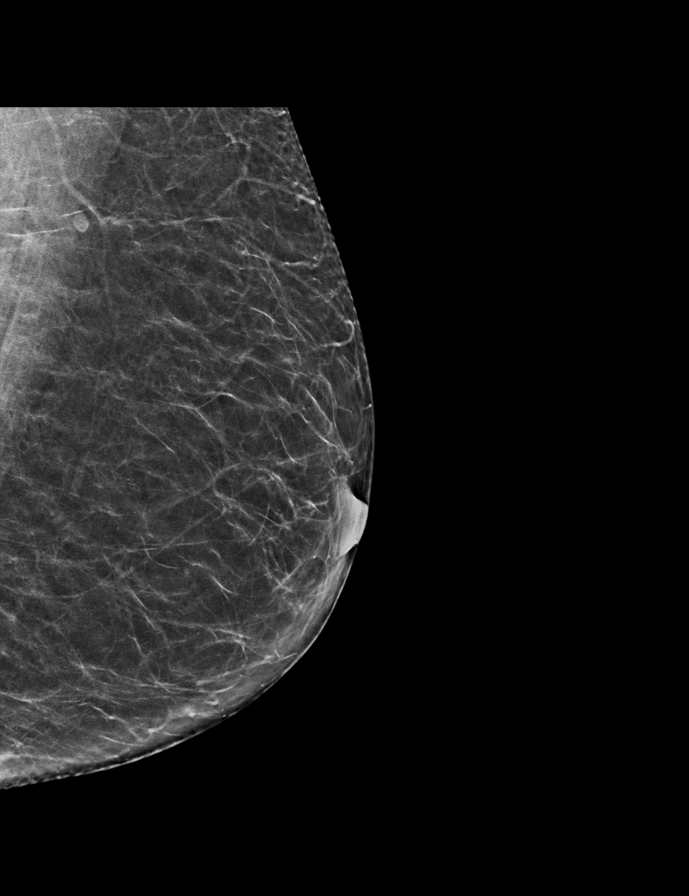

[R MLO]
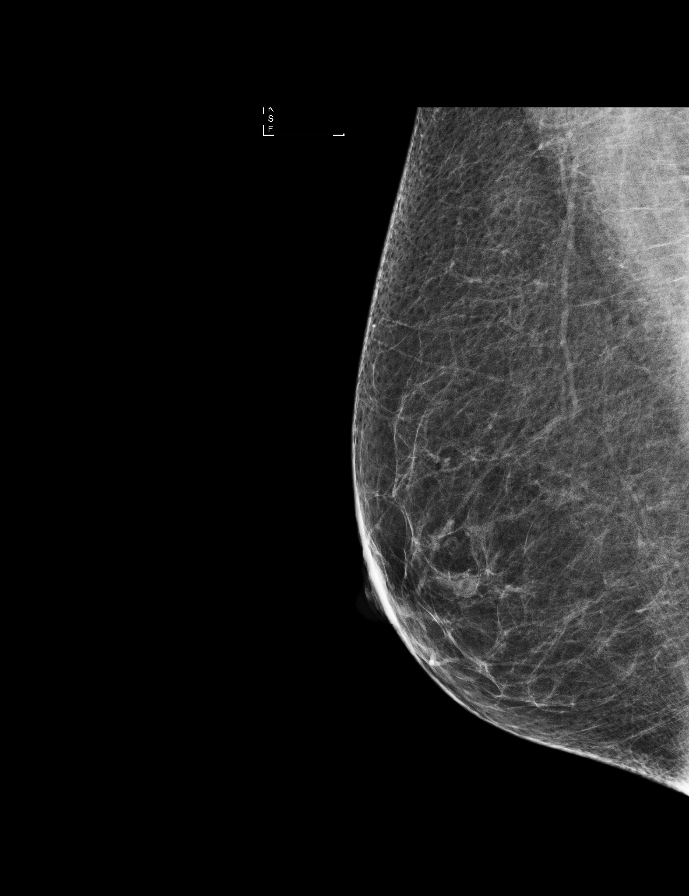

[L MLO]
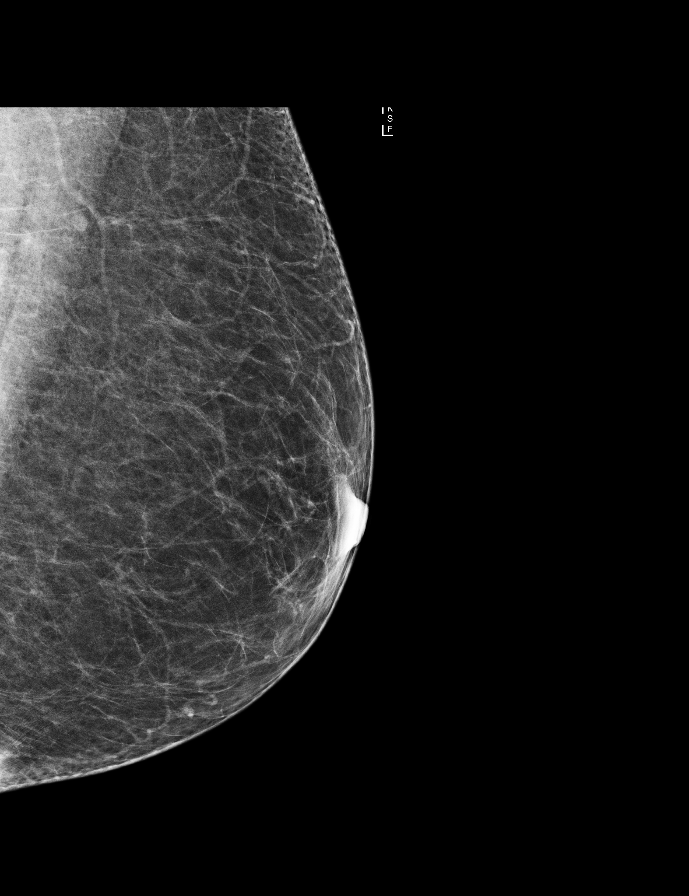

[R MLO tomo · tomo slice 31/61.0]
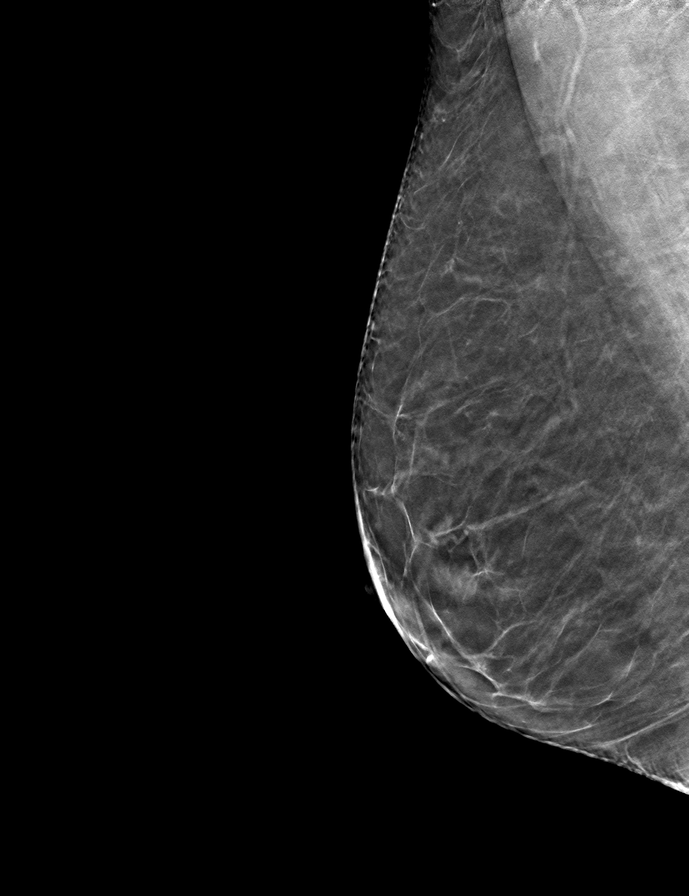

[9 of 28 positions shown; findings below may reference images not displayed]

FINDINGS: CC and MLO views of both breasts with tomosynthesis were obtained.
The intramammary lymph node in the upper left breast, with a visible
fatty hilus, is unchanged dating back to 7170. No new or suspicious
findings in the left breast.

No findings suspicious for malignancy in the right breast.

Mammographic images were processed with CAD.
IMPRESSION: 1. Stable benign intramammary lymph node in the upper left breast
dating back to March 2013.
2. No mammographic evidence of malignancy in either breast.

RECOMMENDATION:
Screening mammogram in one year.(Code:CB-H-789)

I have discussed the findings and recommendations with the patient.
Results were also provided in writing at the conclusion of the
visit. If applicable, a reminder letter will be sent to the patient
regarding the next appointment.

BI-RADS CATEGORY  2: Benign.

## 2018-08-17 ENCOUNTER — Encounter: Payer: Self-pay | Admitting: *Deleted

## 2018-08-18 ENCOUNTER — Encounter: Payer: Self-pay | Admitting: *Deleted

## 2018-08-18 ENCOUNTER — Ambulatory Visit: Payer: BLUE CROSS/BLUE SHIELD | Admitting: Anesthesiology

## 2018-08-18 ENCOUNTER — Encounter
Admission: RE | Disposition: A | Discharge status: Home / Self Care | Payer: Self-pay | Source: Home / Self Care | Attending: Unknown Physician Specialty

## 2018-08-18 ENCOUNTER — Ambulatory Visit
Admission: RE | Admit: 2018-08-18 | Discharge: 2018-08-18 | Disposition: A | Discharge status: Home / Self Care | Payer: BLUE CROSS/BLUE SHIELD | Attending: Unknown Physician Specialty | Admitting: Unknown Physician Specialty

## 2018-08-18 DIAGNOSIS — Z79899 Other long term (current) drug therapy: Secondary | ICD-10-CM | POA: Diagnosis not present

## 2018-08-18 DIAGNOSIS — K5730 Diverticulosis of large intestine without perforation or abscess without bleeding: Secondary | ICD-10-CM | POA: Diagnosis not present

## 2018-08-18 DIAGNOSIS — Z7984 Long term (current) use of oral hypoglycemic drugs: Secondary | ICD-10-CM | POA: Diagnosis not present

## 2018-08-18 DIAGNOSIS — Z86010 Personal history of colonic polyps: Secondary | ICD-10-CM | POA: Diagnosis not present

## 2018-08-18 DIAGNOSIS — K2970 Gastritis, unspecified, without bleeding: Secondary | ICD-10-CM | POA: Diagnosis not present

## 2018-08-18 DIAGNOSIS — K3189 Other diseases of stomach and duodenum: Secondary | ICD-10-CM | POA: Insufficient documentation

## 2018-08-18 DIAGNOSIS — R1310 Dysphagia, unspecified: Secondary | ICD-10-CM | POA: Insufficient documentation

## 2018-08-18 DIAGNOSIS — E1140 Type 2 diabetes mellitus with diabetic neuropathy, unspecified: Secondary | ICD-10-CM | POA: Insufficient documentation

## 2018-08-18 DIAGNOSIS — Z791 Long term (current) use of non-steroidal anti-inflammatories (NSAID): Secondary | ICD-10-CM | POA: Insufficient documentation

## 2018-08-18 DIAGNOSIS — D123 Benign neoplasm of transverse colon: Secondary | ICD-10-CM | POA: Insufficient documentation

## 2018-08-18 DIAGNOSIS — K222 Esophageal obstruction: Secondary | ICD-10-CM | POA: Insufficient documentation

## 2018-08-18 DIAGNOSIS — E039 Hypothyroidism, unspecified: Secondary | ICD-10-CM | POA: Insufficient documentation

## 2018-08-18 DIAGNOSIS — Z79890 Hormone replacement therapy (postmenopausal): Secondary | ICD-10-CM | POA: Diagnosis not present

## 2018-08-18 HISTORY — PX: ESOPHAGOGASTRODUODENOSCOPY (EGD) WITH PROPOFOL: SHX5813

## 2018-08-18 HISTORY — PX: COLONOSCOPY WITH PROPOFOL: SHX5780

## 2018-08-18 LAB — GLUCOSE, CAPILLARY: Glucose-Capillary: 135 mg/dL — ABNORMAL HIGH (ref 70–99)

## 2018-08-18 SURGERY — COLONOSCOPY WITH PROPOFOL
Anesthesia: General

## 2018-08-18 MED ORDER — FENTANYL CITRATE (PF) 100 MCG/2ML IJ SOLN
INTRAMUSCULAR | Status: DC | PRN
  Administered 2018-08-18 (×2): 50 ug via INTRAVENOUS

## 2018-08-18 MED ORDER — PHENYLEPHRINE HCL 10 MG/ML IJ SOLN
INTRAMUSCULAR | Status: AC
  Filled 2018-08-18: qty 1

## 2018-08-18 MED ORDER — SODIUM CHLORIDE 0.9 % IV SOLN
INTRAVENOUS | Status: DC
  Administered 2018-08-18: 07:00:00 via INTRAVENOUS

## 2018-08-18 MED ORDER — PROPOFOL 500 MG/50ML IV EMUL
INTRAVENOUS | Status: AC
  Filled 2018-08-18: qty 50

## 2018-08-18 MED ORDER — GLYCOPYRROLATE 0.2 MG/ML IJ SOLN
INTRAMUSCULAR | Status: AC
  Filled 2018-08-18: qty 1

## 2018-08-18 MED ORDER — PROPOFOL 10 MG/ML IV BOLUS
INTRAVENOUS | Status: AC
  Filled 2018-08-18: qty 20

## 2018-08-18 MED ORDER — LIDOCAINE HCL (PF) 2 % IJ SOLN
INTRAMUSCULAR | Status: AC
  Filled 2018-08-18: qty 10

## 2018-08-18 MED ORDER — PHENYLEPHRINE HCL 10 MG/ML IJ SOLN
INTRAMUSCULAR | Status: DC | PRN
  Administered 2018-08-18: 100 ug via INTRAVENOUS

## 2018-08-18 MED ORDER — PROPOFOL 10 MG/ML IV BOLUS
INTRAVENOUS | Status: DC | PRN
  Administered 2018-08-18: 100 mg via INTRAVENOUS

## 2018-08-18 MED ORDER — FENTANYL CITRATE (PF) 100 MCG/2ML IJ SOLN
INTRAMUSCULAR | Status: AC
  Filled 2018-08-18: qty 2

## 2018-08-18 MED ORDER — SODIUM CHLORIDE 0.9 % IV SOLN: INTRAVENOUS | Status: DC

## 2018-08-18 MED ORDER — PROPOFOL 500 MG/50ML IV EMUL
INTRAVENOUS | Status: DC | PRN
  Administered 2018-08-18: 160 ug/kg/min via INTRAVENOUS

## 2018-08-18 MED ORDER — LIDOCAINE 2% (20 MG/ML) 5 ML SYRINGE
INTRAMUSCULAR | Status: DC | PRN
  Administered 2018-08-18: 30 mg via INTRAVENOUS

## 2018-08-18 NOTE — Transfer of Care (Signed)
Immediate Anesthesia Transfer of Care Note  Patient: [name]  Procedure(s) Performed: COLONOSCOPY WITH PROPOFOL (N/A ) ESOPHAGOGASTRODUODENOSCOPY (EGD) WITH PROPOFOL (N/A )  Patient Location: PACU and Endoscopy Unit  Anesthesia Type:General  Level of Consciousness: drowsy  Airway & Oxygen Therapy: Patient Spontanous Breathing and Patient connected to nasal cannula oxygen  Post-op Assessment: Report given to RN and Post -op Vital signs reviewed and stable  Post vital signs: Reviewed and stable  Last Vitals:  Vitals Value Taken Time  BP    Temp    Pulse 71 08/18/2018  8:50 AM  Resp 15 08/18/2018  8:50 AM  SpO2 97 % 08/18/2018  8:50 AM  Vitals shown include unvalidated device data.  Last Pain:  Vitals:   08/18/18 0850  TempSrc: (P) Tympanic         Complications: No apparent anesthesia complications

## 2018-08-18 NOTE — Anesthesia Post-op Follow-up Note (Signed)
Anesthesia QCDR form completed.        

## 2018-08-18 NOTE — Anesthesia Preprocedure Evaluation (Addendum)
Anesthesia Evaluation  Patient identified by MRN, date of birth, ID band Patient awake    Reviewed: Allergy & Precautions, NPO status , Patient's Chart, lab work & pertinent test results, reviewed documented beta blocker date and time   Airway Mallampati: II  TM Distance: >3 FB     Dental  (+) Chipped, Missing, Poor Dentition, Dental Advisory Given   Pulmonary           Cardiovascular      Neuro/Psych    GI/Hepatic   Endo/Other  diabetes, Type 2Hypothyroidism   Renal/GU CRFRenal disease     Musculoskeletal  (+) Arthritis ,   Abdominal   Peds  Hematology  (+) Blood dyscrasia, ,   Anesthesia Other Findings EKG shows Rbbb. EF 60-65 2 y ago.  Reproductive/Obstetrics                           Anesthesia Physical Anesthesia Plan  ASA: III  Anesthesia Plan: General   Post-op Pain Management:    Induction: Intravenous  PONV Risk Score and Plan:   Airway Management Planned:   Additional Equipment:   Intra-op Plan:   Post-operative Plan:   Informed Consent: I have reviewed the patients History and Physical, chart, labs and discussed the procedure including the risks, benefits and alternatives for the proposed anesthesia with the patient or authorized representative who has indicated his/her understanding and acceptance.       Plan Discussed with: CRNA  Anesthesia Plan Comments:         Anesthesia Quick Evaluation

## 2018-08-18 NOTE — Op Note (Addendum)
[facility] Gastroenterology Patient Name: [name] Procedure Date: 08/18/2018 7:16 AM MRN: [id number] Account #: [id number] Date of Birth: 05-26-1956 Admit Type: Outpatient Age: 63 Room: [facility] ENDO ROOM 2 Gender: Female Note Status: Finalized Procedure:            Upper GI endoscopy Indications:          Dysphagia, Suspected gastro-esophageal reflux disease Providers:            Manya Silvas, MD Referring MD:         Irven Easterly. Kary Kos, MD (Referring MD) Medicines:            Propofol per Anesthesia Complications:        No immediate complications. Procedure:            Pre-Anesthesia Assessment:                       - After reviewing the risks and benefits, the patient                        was deemed in satisfactory condition to undergo the                        procedure.                       After obtaining informed consent, the endoscope was                        passed under direct vision. Throughout the procedure,                        the patient's blood pressure, pulse, and oxygen                        saturations were monitored continuously. The Endoscope                        was introduced through the mouth, and advanced to the                        second part of duodenum. The upper GI endoscopy was                        accomplished without difficulty. The patient tolerated                        the procedure well. Findings:      Patient has very few teeth and was warned that it was possiblle that her       teeth may be dislodged during the procedure.      A mild Schatzki ring was found at the gastroesophageal junction. At the       end of the procedure A guidewire was placed and the scope was withdrawn.       Dilation was performed with a Savary dilator with mild resistance at 17       mm.      Patchy mild inflammation characterized by erythema and granularity was       found in the gastric antrum. Biopsies were taken with a  cold forceps for       histology. Biopsies were  taken with a cold forceps for Helicobacter       pylori testing.      Localized mildly erythematous mucosa without active bleeding and with no       stigmata of bleeding was found in the first and second portion of the       duodenum. Biopsies were taken with a cold forceps for histology. Impression:           - Mild Schatzki ring. Dilated.                       - Gastritis. Biopsied.                       - Erythematous duodenopathy. Biopsied. Recommendation:       - Await pathology results. Manya Silvas, MD 08/18/2018 8:01:47 AM This report has been signed electronically. Number of Addenda: 0 Note Initiated On: 08/18/2018 7:16 AM      [facility]

## 2018-08-18 NOTE — Anesthesia Postprocedure Evaluation (Signed)
Anesthesia Post Note  Patient: Markea Ruzich  Procedure(s) Performed: COLONOSCOPY WITH PROPOFOL (N/A ) ESOPHAGOGASTRODUODENOSCOPY (EGD) WITH PROPOFOL (N/A )  Patient location during evaluation: Endoscopy Anesthesia Type: General Level of consciousness: awake and alert Pain management: pain level controlled Vital Signs Assessment: post-procedure vital signs reviewed and stable Respiratory status: spontaneous breathing, nonlabored ventilation, respiratory function stable and patient connected to nasal cannula oxygen Cardiovascular status: blood pressure returned to baseline and stable Postop Assessment: no apparent nausea or vomiting Anesthetic complications: no     Last Vitals:  Vitals:   08/18/18 0851 08/18/18 0910  BP: (!) 102/55 140/81  Pulse: 71   Resp: 15   Temp: (!) 36.1 C   SpO2: 97%     Last Pain:  Vitals:   08/18/18 0850  TempSrc: Tympanic                 Denzil Bristol S

## 2018-08-18 NOTE — Op Note (Signed)
[facility] Gastroenterology Patient Name: [name] Procedure Date: 08/18/2018 7:15 AM MRN: [id number] Account #: [id number] Date of Birth: 1955/10/28 Admit Type: Outpatient Age: 63 Room: [facility] ENDO ROOM 2 Gender: Female Note Status: Finalized Procedure:            Colonoscopy Indications:          High risk colon cancer surveillance: Personal history                        of colonic polyps Providers:            Manya Silvas, MD Referring MD:         Irven Easterly. Kary Kos, MD (Referring MD) Medicines:            Propofol per Anesthesia Complications:        No immediate complications. Procedure:            Pre-Anesthesia Assessment:                       - After reviewing the risks and benefits, the patient                        was deemed in satisfactory condition to undergo the                        procedure.                       After obtaining informed consent, the colonoscope was                        passed under direct vision. Throughout the procedure,                        the patient's blood pressure, pulse, and oxygen                        saturations were monitored continuously. The                        Colonoscope was introduced through the anus and                        advanced to the the cecum, identified by appendiceal                        orifice and ileocecal valve. The colonoscopy was                        somewhat difficult due to a redundant colon. Successful                        completion of the procedure was aided by applying                        abdominal pressure. The patient tolerated the procedure                        well. The quality of the bowel preparation was good. Findings:      A 14 mm polyp was found in the proximal ascending  colon. The polyp was       sessile. The polyp was removed with a hot snare. Resection and retrieval       were complete. To prevent bleeding after the polypectomy, four   hemostatic clips were successfully placed. There was no bleeding at the       end of the procedure. She has a history of post polypectomy bleeding       after a polyp removal several years ago.      A medium polyp was found in the hepatic flexure. The polyp was sessile.       The polyp was removed with a hot snare. Resection and retrieval were       complete. To prevent bleeding after the polypectomy, four hemostatic       clips were successfully placed. There was no bleeding at the end of the       procedure.      A few small-mouthed diverticula were found in the sigmoid colon.      The exam was otherwise without abnormality. Impression:           - One 14 mm polyp in the proximal ascending colon,                        removed with a hot snare. Resected and retrieved. Clips                        were placed.                       - One medium polyp at the hepatic flexure, removed with                        a hot snare. Resected and retrieved. Clips were placed.                       - Diverticulosis in the sigmoid colon.                       - The examination was otherwise normal. Recommendation:       - Await pathology results. Manya Silvas, MD 08/18/2018 8:51:08 AM This report has been signed electronically. Number of Addenda: 0 Note Initiated On: 08/18/2018 7:15 AM Scope Withdrawal Time: 0 hours 33 minutes 22 seconds  Total Procedure Duration: 0 hours 41 minutes 6 seconds       [facility]

## 2018-08-18 NOTE — H&P (Signed)
Primary Care Physician:  Maryland Pink, MD Primary Gastroenterologist:  Dr. Vira Agar  Pre-Procedure History & Physical: HPI:  [name] is a 63 y.o. female is here for an endoscopy and colonoscopy.   Past Medical History:  Diagnosis Date  . Arthritis   . Blood dyscrasia    patient stated she is a "free bleeder"  . Diabetes mellitus without complication ([place])   . Diverticulosis   . Hypothyroidism   . Neuropathy     Past Surgical History:  Procedure Laterality Date  . ABDOMINAL HYSTERECTOMY    . BILATERAL SALPINGECTOMY Bilateral 03/16/2017   Procedure: BILATERAL SALPINGECTOMY;  Surgeon: Schermerhorn, Gwen Her, MD;  Location: [facility] ORS;  Service: Gynecology;  Laterality: Bilateral;  . CYSTOCELE REPAIR N/A 03/16/2017   Procedure: ANTERIOR REPAIR (CYSTOCELE);  Surgeon: Schermerhorn, Gwen Her, MD;  Location: [facility] ORS;  Service: Gynecology;  Laterality: N/A;  . TEAR DUCT PROBING Right   . TUBAL LIGATION    . VAGINAL HYSTERECTOMY N/A 03/16/2017   Procedure: HYSTERECTOMY VAGINAL;  Surgeon: Schermerhorn, Gwen Her, MD;  Location: [facility] ORS;  Service: Gynecology;  Laterality: N/A;  . WISDOM TOOTH EXTRACTION      Prior to Admission medications   Medication Sig Start Date End Date Taking? Authorizing Provider  gabapentin (NEURONTIN) 100 MG capsule Take 200 mg by mouth 2 (two) times daily.  08/16/15  Yes [provider]  glipiZIDE (GLUCOTROL) 5 MG tablet Take 5 mg by mouth 2 (two) times daily before a meal.  06/02/16  Yes [provider]  ibuprofen (ADVIL,MOTRIN) 200 MG tablet Take 200 mg by mouth every 6 (six) hours as needed for moderate pain.   Yes [provider]  levothyroxine (SYNTHROID, LEVOTHROID) 75 MCG tablet Take 75 mcg by mouth daily.  10/12/17  Yes [provider]  metFORMIN (GLUCOPHAGE-XR) 500 MG 24 hr tablet Take 1,000 mg by mouth daily.  08/31/15  Yes [provider]  naproxen sodium (ALEVE) 220 MG tablet Take 220 mg by mouth 2 (two)  times daily as needed (foot pain).   Yes [provider]  omeprazole (PRILOSEC) 40 MG capsule Take 40 mg by mouth daily. Take 30 min after Synthroid and 30 min before breakfast   Yes [provider]  ibuprofen (ADVIL,MOTRIN) 800 MG tablet Take 1 tablet (800 mg total) by mouth every 8 (eight) hours as needed. Patient not taking: Reported on 10/25/2017 03/17/17   Schermerhorn, Gwen Her, MD  ondansetron (ZOFRAN) 4 MG tablet Take 1 tablet (4 mg total) by mouth every 8 (eight) hours as needed for nausea or vomiting. 10/25/17   Schuyler Amor, MD  oxyCODONE-acetaminophen (PERCOCET) 5-325 MG tablet Take 1 tablet by mouth every 4 (four) hours as needed for severe pain. Patient not taking: Reported on 08/18/2018 10/25/17   Schuyler Amor, MD  oxyCODONE-acetaminophen (PERCOCET/ROXICET) 5-325 MG tablet Take 1 tablet by mouth every 4 (four) hours as needed for moderate pain. Patient not taking: Reported on 10/25/2017 03/17/17   Schermerhorn, Gwen Her, MD  tolterodine (DETROL) 2 MG tablet Take 2 mg by mouth daily.     [provider]    Allergies as of 06/21/2018  . (No Known Allergies)    Family History  Problem Relation Age of Onset  . Breast cancer Maternal Aunt   . Hypertension Mother   . Hypertension Father   . Diabetes Father     Social History   Socioeconomic History  . Marital status: Married    Spouse name: Not on  file  . Number of children: Not on file  . Years of education: Not on file  . Highest education level: Not on file  Occupational History  . Not on file  Social Needs  . Financial resource strain: Not on file  . Food insecurity:    Worry: Not on file    Inability: Not on file  . Transportation needs:    Medical: Not on file    Non-medical: Not on file  Tobacco Use  . Smoking status: Never Smoker  . Smokeless tobacco: Never Used  Substance and Sexual Activity  . Alcohol use: No  . Drug use: No  . Sexual activity: Not on file  Lifestyle  .  Physical activity:    Days per week: Not on file    Minutes per session: Not on file  . Stress: Not on file  Relationships  . Social connections:    Talks on phone: Not on file    Gets together: Not on file    Attends religious service: Not on file    Active member of club or organization: Not on file    Attends meetings of clubs or organizations: Not on file    Relationship status: Not on file  . Intimate partner violence:    Fear of current or ex partner: Not on file    Emotionally abused: Not on file    Physically abused: Not on file    Forced sexual activity: Not on file  Other Topics Concern  . Not on file  Social History Narrative  . Not on file    Review of Systems: See HPI, otherwise negative ROS  Physical Exam: BP (!) 148/62   Pulse 84   Temp (!) 96.9 F (36.1 C) (Tympanic)   Resp 18   Ht 5\' 6"  (1.676 m)   Wt 65.3 kg   SpO2 100%   BMI 23.24 kg/m  General:   Alert,  pleasant and cooperative in NAD Head:  Normocephalic and atraumatic. Neck:  Supple; no masses or thyromegaly. Lungs:  Clear throughout to auscultation.    Heart:  Regular rate and rhythm. Abdomen:  Soft, nontender and nondistended. Normal bowel sounds, without guarding, and without rebound.   Neurologic:  Alert and  oriented x4;  grossly normal neurologically.  Impression/Plan: [name] is here for an endoscopy and colonoscopy to be performed for [facility], [place] colon polyps and dysphagia. And GERD.  Risks, benefits, limitations, and alternatives regarding  endoscopy and colonoscopy have been reviewed with the patient.  Questions have been answered.  All parties agreeable.   Gaylyn Cheers, MD  08/18/2018, 7:35 AM

## 2018-08-19 LAB — SURGICAL PATHOLOGY

## 2018-08-31 ENCOUNTER — Emergency Department
Admission: EM | Admit: 2018-08-31 | Discharge: 2018-08-31 | Disposition: A | Discharge status: Home / Self Care | Payer: BLUE CROSS/BLUE SHIELD | Attending: Emergency Medicine | Admitting: Emergency Medicine

## 2018-08-31 ENCOUNTER — Other Ambulatory Visit: Payer: Self-pay

## 2018-08-31 ENCOUNTER — Encounter: Payer: Self-pay | Admitting: Emergency Medicine

## 2018-08-31 DIAGNOSIS — K625 Hemorrhage of anus and rectum: Secondary | ICD-10-CM | POA: Diagnosis not present

## 2018-08-31 DIAGNOSIS — E039 Hypothyroidism, unspecified: Secondary | ICD-10-CM | POA: Diagnosis not present

## 2018-08-31 DIAGNOSIS — E119 Type 2 diabetes mellitus without complications: Secondary | ICD-10-CM | POA: Insufficient documentation

## 2018-08-31 DIAGNOSIS — Z7984 Long term (current) use of oral hypoglycemic drugs: Secondary | ICD-10-CM | POA: Insufficient documentation

## 2018-08-31 DIAGNOSIS — R109 Unspecified abdominal pain: Secondary | ICD-10-CM | POA: Diagnosis present

## 2018-08-31 LAB — URINALYSIS, COMPLETE (UACMP) WITH MICROSCOPIC
Bacteria, UA: NONE SEEN
Bilirubin Urine: NEGATIVE
Glucose, UA: NEGATIVE mg/dL
Hgb urine dipstick: NEGATIVE
Ketones, ur: NEGATIVE mg/dL
Nitrite: NEGATIVE
Protein, ur: NEGATIVE mg/dL
Specific Gravity, Urine: 1.014 (ref 1.005–1.030)
pH: 6 (ref 5.0–8.0)

## 2018-08-31 LAB — COMPREHENSIVE METABOLIC PANEL
ALT: 27 U/L (ref 0–44)
ANION GAP: 9 (ref 5–15)
AST: 37 U/L (ref 15–41)
Albumin: 3.9 g/dL (ref 3.5–5.0)
Alkaline Phosphatase: 83 U/L (ref 38–126)
BUN: 26 mg/dL — ABNORMAL HIGH (ref 6–20)
CHLORIDE: 106 mmol/L (ref 98–111)
CO2: 24 mmol/L (ref 22–32)
Calcium: 8.6 mg/dL — ABNORMAL LOW (ref 8.9–10.3)
Creatinine, Ser: 0.99 mg/dL (ref 0.44–1.00)
GFR calc Af Amer: >60 mL/min (ref >60)
GFR calc non Af Amer: >60 mL/min (ref >60)
Glucose, Bld: 162 mg/dL — ABNORMAL HIGH (ref 70–99)
POTASSIUM: 4.5 mmol/L (ref 3.5–5.1)
Sodium: 139 mmol/L (ref 135–145)
Total Bilirubin: 0.8 mg/dL (ref 0.3–1.2)
Total Protein: 7.4 g/dL (ref 6.5–8.1)

## 2018-08-31 LAB — CBC
HCT: 33.3 % — ABNORMAL LOW (ref 36.0–46.0)
HEMATOCRIT: 34 % — AB (ref 36.0–46.0)
HEMOGLOBIN: 10.9 g/dL — AB (ref 12.0–15.0)
Hemoglobin: 11.2 g/dL — ABNORMAL LOW (ref 12.0–15.0)
MCH: 27.9 pg (ref 26.0–34.0)
MCH: 27.9 pg (ref 26.0–34.0)
MCHC: 32.9 g/dL (ref 30.0–36.0)
MCHC: 32.7 g/dL (ref 30.0–36.0)
MCV: 84.6 fL (ref 80.0–100.0)
MCV: 85.4 fL (ref 80.0–100.0)
Platelets: 161 10*3/uL (ref 150–400)
Platelets: 166 10*3/uL (ref 150–400)
RBC: 4.02 MIL/uL (ref 3.87–5.11)
RBC: 3.9 MIL/uL (ref 3.87–5.11)
RDW: 13.1 % (ref 11.5–15.5)
RDW: 13 % (ref 11.5–15.5)
WBC: 6.8 10*3/uL (ref 4.0–10.5)
WBC: 7.5 10*3/uL (ref 4.0–10.5)
nRBC: 0 % (ref 0.0–0.2)
nRBC: 0 % (ref 0.0–0.2)

## 2018-08-31 LAB — PROTIME-INR
INR: 1.1 (ref 0.8–1.2)
Prothrombin Time: 13.9 seconds (ref 11.4–15.2)

## 2018-08-31 LAB — APTT: aPTT: 33 seconds (ref 24–36)

## 2018-08-31 LAB — LIPASE, BLOOD: LIPASE: 54 U/L — AB (ref 11–51)

## 2018-08-31 NOTE — ED Triage Notes (Signed)
Pt presents to ED via POV with c/o rectal bleeding that started this morning after having a BM. Pt states approx 2 weeks ago had a colonoscopy done at [facility]. Pt states after bowel movement she began having lower abdominal pain. Pt states bright red blood noted in stool.

## 2018-08-31 NOTE — Discharge Instructions (Signed)
You were seen in the emergency room for abdominal pain. It is important that you follow up closely with your gastroenterology team in 1 to 2 days.   Please return to the emergency room right away if you are to develop a fever, notice increased or worsening bleeding, severe nausea, your pain becomes severe or worsens, you are unable to keep food down, begin vomiting any dark or bloody fluid, you develop any dark or bloody stools, feel dehydrated, or other new concerns or symptoms arise.

## 2018-08-31 NOTE — ED Notes (Signed)
Pt coloscopy and endoscopy on 2/12 at [facility] with removal of polyps. Denies having any issues since until today having LLQ pain with bloody stool with a clot x1 today. Denies any black or bloody stools prior to today. Denies N/V or fever.

## 2018-08-31 NOTE — ED Provider Notes (Addendum)
[facility] Emergency Department Provider Note   ____________________________________________   First MD Initiated Contact with Patient 08/31/18 1451     (approximate)  I have reviewed the triage vital signs and the nursing notes.   HISTORY  Chief Complaint Rectal bleeding    HPI [name] is a 63 y.o. female here for evaluation of rectal bleeding   Patient reports that she had a colonoscopy 2 weeks ago.  Today she had a normal bowel movement this morning, but she noticed blood on the tissue paper.  She called the GI clinic who was unable to see her today, and she reports she came to make sure that everything is okay.  Also reports she had a previous colonoscopy and had a similar event at that time.  Denies any ongoing bloody bowel movements.  She reports there is just red blood seen mostly on the tissue paper.  No black stools.  No vomiting.  No abdominal pain.  No fevers or chills.  Does not take any blood thinners, but reports she thinks she is a "free bleeder" but does not have a particular diagnosis regarding easy bleeding  Past Medical History:  Diagnosis Date  . Arthritis   . Blood dyscrasia    patient stated she is a "free bleeder"  . Diabetes mellitus without complication ([place])   . Diverticulosis   . Hypothyroidism   . Neuropathy     Patient Active Problem List   Diagnosis Date Noted  . Post-operative state 03/16/2017  . Acute renal failure (ARF) ([place]) 01/18/2017  . Acute respiratory failure with hypoxia ([place]) 11/04/2015    Past Surgical History:  Procedure Laterality Date  . ABDOMINAL HYSTERECTOMY    . BILATERAL SALPINGECTOMY Bilateral 03/16/2017   Procedure: BILATERAL SALPINGECTOMY;  Surgeon: Schermerhorn, Gwen Her, MD;  Location: [facility] ORS;  Service: Gynecology;  Laterality: Bilateral;  . COLONOSCOPY WITH PROPOFOL N/A 08/18/2018   Procedure: COLONOSCOPY WITH PROPOFOL;  Surgeon: Manya Silvas, MD;  Location: [facility] ENDOSCOPY;   Service: Endoscopy;  Laterality: N/A;  . CYSTOCELE REPAIR N/A 03/16/2017   Procedure: ANTERIOR REPAIR (CYSTOCELE);  Surgeon: Schermerhorn, Gwen Her, MD;  Location: [facility] ORS;  Service: Gynecology;  Laterality: N/A;  . ESOPHAGOGASTRODUODENOSCOPY (EGD) WITH PROPOFOL N/A 08/18/2018   Procedure: ESOPHAGOGASTRODUODENOSCOPY (EGD) WITH PROPOFOL;  Surgeon: Manya Silvas, MD;  Location: [facility] ENDOSCOPY;  Service: Endoscopy;  Laterality: N/A;  . TEAR DUCT PROBING Right   . TUBAL LIGATION    . VAGINAL HYSTERECTOMY N/A 03/16/2017   Procedure: HYSTERECTOMY VAGINAL;  Surgeon: Schermerhorn, Gwen Her, MD;  Location: [facility] ORS;  Service: Gynecology;  Laterality: N/A;  . WISDOM TOOTH EXTRACTION      Prior to Admission medications   Medication Sig Start Date End Date Taking? Authorizing Provider  gabapentin (NEURONTIN) 100 MG capsule Take 200 mg by mouth 2 (two) times daily.  08/16/15   [provider]  glipiZIDE (GLUCOTROL) 5 MG tablet Take 5 mg by mouth 2 (two) times daily before a meal.  06/02/16   [provider]  ibuprofen (ADVIL,MOTRIN) 200 MG tablet Take 200 mg by mouth every 6 (six) hours as needed for moderate pain.    [provider]  ibuprofen (ADVIL,MOTRIN) 800 MG tablet Take 1 tablet (800 mg total) by mouth every 8 (eight) hours as needed. Patient not taking: Reported on 10/25/2017 03/17/17   Schermerhorn, Gwen Her, MD  levothyroxine (SYNTHROID, LEVOTHROID) 75 MCG tablet Take 75 mcg by mouth daily.  10/12/17   [provider]  metFORMIN (GLUCOPHAGE-XR) 500 MG 24 hr tablet Take 1,000 mg by mouth daily.  08/31/15   [provider]  naproxen sodium (ALEVE) 220 MG tablet Take 220 mg by mouth 2 (two) times daily as needed (foot pain).    [provider]  omeprazole (PRILOSEC) 40 MG capsule Take 40 mg by mouth daily. Take 30 min after Synthroid and 30 min before breakfast    [provider]  ondansetron (ZOFRAN) 4 MG tablet Take 1 tablet (4 mg total)  by mouth every 8 (eight) hours as needed for nausea or vomiting. 10/25/17   Schuyler Amor, MD  oxyCODONE-acetaminophen (PERCOCET) 5-325 MG tablet Take 1 tablet by mouth every 4 (four) hours as needed for severe pain. Patient not taking: Reported on 08/18/2018 10/25/17   Schuyler Amor, MD  oxyCODONE-acetaminophen (PERCOCET/ROXICET) 5-325 MG tablet Take 1 tablet by mouth every 4 (four) hours as needed for moderate pain. Patient not taking: Reported on 10/25/2017 03/17/17   Schermerhorn, Gwen Her, MD  tolterodine (DETROL) 2 MG tablet Take 2 mg by mouth daily.     [provider]    Allergies Patient has no known allergies.  Family History  Problem Relation Age of Onset  . Breast cancer Maternal Aunt   . Hypertension Mother   . Hypertension Father   . Diabetes Father     Social History Social History   Tobacco Use  . Smoking status: Never Smoker  . Smokeless tobacco: Never Used  Substance Use Topics  . Alcohol use: No  . Drug use: No    Review of Systems Constitutional: No fever/chills no lightheadedness Cardiovascular: Denies chest pain. Respiratory: Denies shortness of breath. Gastrointestinal: No abdominal pain.  See HPI Genitourinary: Negative for dysuria. Musculoskeletal: Negative for back pain. Skin: Negative for rash. Neurological: Negative for headaches, areas of focal weakness or numbness.    ____________________________________________   PHYSICAL EXAM:  VITAL SIGNS: ED Triage Vitals  Enc Vitals Group     BP 08/31/18 1158 121/65     Pulse Rate 08/31/18 1158 87     Resp 08/31/18 1158 18     Temp 08/31/18 1158 97.9 F (36.6 C)     Temp Source 08/31/18 1158 Oral     SpO2 08/31/18 1158 98 %     Weight 08/31/18 1159 144 lb (65.3 kg)     Height 08/31/18 1159 5\' 6"  (1.676 m)     Head Circumference --      Peak Flow --      Pain Score 08/31/18 1159 5     Pain Loc --      Pain Edu? --      Excl. in [place]? --     Constitutional: Alert and oriented.  Well appearing and in no acute distress. Eyes: Conjunctivae are normal. Head: Atraumatic. Nose: No congestion/rhinnorhea. Mouth/Throat: Mucous membranes are moist. Neck: No stridor.  Cardiovascular: Normal rate, regular rhythm. Grossly normal heart sounds.  Good peripheral circulation. Respiratory: Normal respiratory effort.  No retractions. Lungs CTAB. Gastrointestinal: Soft and nontender. No distention.  Rectal exam performed with nurse at the bedside.  The stool was brown, nonbloody.  There is a small amount of red blood, but no active bleeding is noted.  No external hemorrhoids. Musculoskeletal: No lower extremity tenderness nor edema. Neurologic:  Normal speech and language. No gross focal neurologic deficits are appreciated.  Skin:  Skin is warm, dry and intact. No rash noted. Psychiatric: Mood and affect are normal. Speech and behavior are normal.  ____________________________________________   LABS (all labs ordered are listed, but only abnormal results are displayed)  Labs Reviewed  LIPASE, BLOOD - Abnormal; Notable for the following components:      Result Value   Lipase 54 (*)    All other components within normal limits  COMPREHENSIVE METABOLIC PANEL - Abnormal; Notable for the following components:   Glucose, Bld 162 (*)    BUN 26 (*)    Calcium 8.6 (*)    All other components within normal limits  CBC - Abnormal; Notable for the following components:   Hemoglobin 11.2 (*)    HCT 34.0 (*)    All other components within normal limits  URINALYSIS, COMPLETE (UACMP) WITH MICROSCOPIC - Abnormal; Notable for the following components:   Color, Urine YELLOW (*)    APPearance CLEAR (*)    Leukocytes,Ua MODERATE (*)    All other components within normal limits  CBC - Abnormal; Notable for the following components:   Hemoglobin 10.9 (*)    HCT 33.3 (*)    All other components within normal limits  PROTIME-INR  APTT    ____________________________________________  EKG   ____________________________________________  RADIOLOGY   ____________________________________________   PROCEDURES  Procedure(s) performed: None  Procedures  Critical Care performed: No  ____________________________________________   INITIAL IMPRESSION / ASSESSMENT AND PLAN / ED COURSE  Pertinent labs & imaging results that were available during my care of the patient were reviewed by me and considered in my medical decision making (see chart for details).   Painless rectal bleeding.  Recent polyp removal, could represent some bleeding after this.  There is no evidence of large amount of bleeding.  Hemodynamics are normal.  Has had hemoglobin checked twice, no significant drop.  Denies any ongoing blood or bleeding.  Denies abdominal pain with reassuring exam without any evidence of peritonitis or abdominal pain to examination.  Does have known diverticulosis as well.  Clinical Course as of Sep 01 1735  Tue Aug 31, 2018  1645 Dr. Alice Reichert advises ok to discharge and can follow-up within a week, sooner if recurrence of her bleeding. Needs to follow-up in his office or with Dawson Bills.    [MQ]    Clinical Course User Index [MQ] [name], MD    ----------------------------------------- 5:37 PM on 08/31/2018 -----------------------------------------  Patient resting comfortably.  No further bowel movements or bleeding.  Appears hemodynamically stable, fully alert and appropriate for discharge.  She will call GI clinic for follow-up.  I have communicated also with Dr. Alice Reichert.  Return precautions and treatment recommendations and follow-up discussed with the patient who is agreeable with the plan.  ____________________________________________   FINAL CLINICAL IMPRESSION(S) / ED DIAGNOSES  Final diagnoses:  Rectal bleeding        Note:  This document was prepared using Dragon voice recognition software and  may include unintentional dictation errors       [name], MD 08/31/18 1737    [name], MD 08/31/18 1737

## 2018-09-23 ENCOUNTER — Other Ambulatory Visit: Payer: Self-pay

## 2018-09-23 ENCOUNTER — Emergency Department: Payer: PRIVATE HEALTH INSURANCE

## 2018-09-23 ENCOUNTER — Encounter: Payer: Self-pay | Admitting: Emergency Medicine

## 2018-09-23 ENCOUNTER — Emergency Department
Admission: EM | Admit: 2018-09-23 | Discharge: 2018-09-23 | Disposition: A | Discharge status: Home / Self Care | Payer: PRIVATE HEALTH INSURANCE | Attending: Emergency Medicine | Admitting: Emergency Medicine

## 2018-09-23 DIAGNOSIS — W0110XA Fall on same level from slipping, tripping and stumbling with subsequent striking against unspecified object, initial encounter: Secondary | ICD-10-CM | POA: Insufficient documentation

## 2018-09-23 DIAGNOSIS — Z7984 Long term (current) use of oral hypoglycemic drugs: Secondary | ICD-10-CM | POA: Insufficient documentation

## 2018-09-23 DIAGNOSIS — W19XXXA Unspecified fall, initial encounter: Secondary | ICD-10-CM

## 2018-09-23 DIAGNOSIS — E119 Type 2 diabetes mellitus without complications: Secondary | ICD-10-CM | POA: Insufficient documentation

## 2018-09-23 DIAGNOSIS — E039 Hypothyroidism, unspecified: Secondary | ICD-10-CM | POA: Diagnosis not present

## 2018-09-23 DIAGNOSIS — Z79899 Other long term (current) drug therapy: Secondary | ICD-10-CM | POA: Insufficient documentation

## 2018-09-23 DIAGNOSIS — M25562 Pain in left knee: Secondary | ICD-10-CM | POA: Insufficient documentation

## 2018-09-23 MED ORDER — KETOROLAC TROMETHAMINE 30 MG/ML IJ SOLN
30.0000 mg | Freq: Once | INTRAMUSCULAR | Status: AC
  Administered 2018-09-23: 30 mg via INTRAMUSCULAR
  Filled 2018-09-23: qty 1

## 2018-09-23 MED ORDER — OXYCODONE-ACETAMINOPHEN 5-325 MG PO TABS: 1.0000 | ORAL_TABLET | Freq: Four times a day (QID) | ORAL | 0 refills | Status: DC | PRN

## 2018-09-23 MED ORDER — MORPHINE SULFATE (PF) 2 MG/ML IV SOLN
2.0000 mg | Freq: Once | INTRAVENOUS | Status: AC
  Administered 2018-09-23: 2 mg via INTRAMUSCULAR
  Filled 2018-09-23: qty 1

## 2018-09-23 MED ORDER — OXYCODONE-ACETAMINOPHEN 5-325 MG PO TABS
1.0000 | ORAL_TABLET | Freq: Once | ORAL | Status: AC
  Administered 2018-09-23: 1 via ORAL
  Filled 2018-09-23: qty 1

## 2018-09-23 NOTE — ED Triage Notes (Signed)
Brought in via EMS s/p fall  States she tripped   [place]  Having pain to left knee

## 2018-09-23 NOTE — ED Provider Notes (Signed)
[facility] Emergency Department Provider Note  ____________________________________________  Time seen: Approximately 8:52 AM  I have reviewed the triage vital signs and the nursing notes.   HISTORY  Chief Complaint Fall    HPI [name] is a 63 y.o. female presents emergency department for evaluation of left leg pain after fall this morning.  Patient states that she tripped at [facility] and landed on her left knee.  she is having pain to her left thigh between her knee and her hip.  Patient is having pain to her entire left leg.  She did not hit her head or lose consciousness.  No additional injuries.  Past Medical History:  Diagnosis Date  . Arthritis   . Blood dyscrasia    patient stated she is a "free bleeder"  . Diabetes mellitus without complication ([place])   . Diverticulosis   . Hypothyroidism   . Neuropathy     Patient Active Problem List   Diagnosis Date Noted  . Post-operative state 03/16/2017  . Acute renal failure (ARF) ([place]) 01/18/2017  . Acute respiratory failure with hypoxia ([place]) 11/04/2015    Past Surgical History:  Procedure Laterality Date  . ABDOMINAL HYSTERECTOMY    . BILATERAL SALPINGECTOMY Bilateral 03/16/2017   Procedure: BILATERAL SALPINGECTOMY;  Surgeon: Schermerhorn, Gwen Her, MD;  Location: [facility] ORS;  Service: Gynecology;  Laterality: Bilateral;  . COLONOSCOPY WITH PROPOFOL N/A 08/18/2018   Procedure: COLONOSCOPY WITH PROPOFOL;  Surgeon: Manya Silvas, MD;  Location: [facility] ENDOSCOPY;  Service: Endoscopy;  Laterality: N/A;  . CYSTOCELE REPAIR N/A 03/16/2017   Procedure: ANTERIOR REPAIR (CYSTOCELE);  Surgeon: Schermerhorn, Gwen Her, MD;  Location: [facility] ORS;  Service: Gynecology;  Laterality: N/A;  . ESOPHAGOGASTRODUODENOSCOPY (EGD) WITH PROPOFOL N/A 08/18/2018   Procedure: ESOPHAGOGASTRODUODENOSCOPY (EGD) WITH PROPOFOL;  Surgeon: Manya Silvas, MD;  Location: [facility] ENDOSCOPY;  Service: Endoscopy;  Laterality: N/A;  .  TEAR DUCT PROBING Right   . TUBAL LIGATION    . VAGINAL HYSTERECTOMY N/A 03/16/2017   Procedure: HYSTERECTOMY VAGINAL;  Surgeon: Schermerhorn, Gwen Her, MD;  Location: [facility] ORS;  Service: Gynecology;  Laterality: N/A;  . WISDOM TOOTH EXTRACTION      Prior to Admission medications   Medication Sig Start Date End Date Taking? Authorizing Provider  gabapentin (NEURONTIN) 100 MG capsule Take 200 mg by mouth 2 (two) times daily.  08/16/15   [provider]  glipiZIDE (GLUCOTROL) 5 MG tablet Take 5 mg by mouth 2 (two) times daily before a meal.  06/02/16   [provider]  ibuprofen (ADVIL,MOTRIN) 200 MG tablet Take 200 mg by mouth every 6 (six) hours as needed for moderate pain.    [provider]  levothyroxine (SYNTHROID, LEVOTHROID) 75 MCG tablet Take 75 mcg by mouth daily.  10/12/17   [provider]  metFORMIN (GLUCOPHAGE-XR) 500 MG 24 hr tablet Take 1,000 mg by mouth daily.  08/31/15   [provider]  naproxen sodium (ALEVE) 220 MG tablet Take 220 mg by mouth 2 (two) times daily as needed (foot pain).    [provider]  omeprazole (PRILOSEC) 40 MG capsule Take 40 mg by mouth daily. Take 30 min after Synthroid and 30 min before breakfast    [provider]  ondansetron (ZOFRAN) 4 MG tablet Take 1 tablet (4 mg total) by mouth every 8 (eight) hours as needed for nausea or vomiting. 10/25/17   Schuyler Amor, MD  oxyCODONE-acetaminophen (PERCOCET) 5-325 MG tablet Take 1 tablet by mouth every 6 (  six) hours as needed for severe pain. 09/23/18 09/23/19  Laban Emperor, PA-C  tolterodine (DETROL) 2 MG tablet Take 2 mg by mouth daily.     [provider]    Allergies Patient has no known allergies.  Family History  Problem Relation Age of Onset  . Breast cancer Maternal Aunt   . Hypertension Mother   . Hypertension Father   . Diabetes Father     Social History Social History   Tobacco Use  . Smoking status: Never Smoker   . Smokeless tobacco: Never Used  Substance Use Topics  . Alcohol use: No  . Drug use: No     Review of Systems  Respiratory: No SOB. Gastrointestinal:  No nausea, no vomiting.  Musculoskeletal: Positive for knee pain.  Skin: Negative for rash, abrasions, lacerations, ecchymosis. Neurological: Negative for headaches, numbness or tingling   ____________________________________________   PHYSICAL EXAM:  VITAL SIGNS: ED Triage Vitals  Enc Vitals Group     BP 09/23/18 0843 140/68     Pulse Rate 09/23/18 0843 88     Resp 09/23/18 0843 18     Temp 09/23/18 0843 97.9 F (36.6 C)     Temp Source 09/23/18 0843 Oral     SpO2 09/23/18 0843 99 %     Weight 09/23/18 0844 143 lb 15.4 oz (65.3 kg)     Height 09/23/18 0844 5\' 6"  (1.676 m)     Head Circumference --      Peak Flow --      Pain Score 09/23/18 0844 10     Pain Loc --      Pain Edu? --      Excl. in [place]? --      Constitutional: Alert and oriented. Well appearing and in no acute distress. Eyes: Conjunctivae are normal. PERRL. EOMI. Head: Atraumatic. ENT:      Ears:      Nose: No congestion/rhinnorhea.      Mouth/Throat: Mucous membranes are moist.  Neck: No stridor. Cardiovascular: Normal rate, regular rhythm.  Good peripheral circulation. Respiratory: Normal respiratory effort without tachypnea or retractions. Lungs CTAB. Good air entry to the bases with no decreased or absent breath sounds. Gastrointestinal: Bowel sounds 4 quadrants. Soft and nontender to palpation. No guarding or rigidity. No palpable masses. No distention.  Musculoskeletal: No gross deformities appreciated.  Tenderness to palpation primarily to distal thigh.  Able to perform active flexion and extension of knee.  Able to move all toes.  Full range of motion of ankle.  Full internal and external rotation and flexion and extension of hip.  No visible swelling or ecchymosis.  Weightbearing.  Able to pivot from bed into chair. Neurologic:  Normal  speech and language. No gross focal neurologic deficits are appreciated.  Skin:  Skin is warm, dry and intact. No rash noted. Psychiatric: Mood and affect are normal. Speech and behavior are normal. Patient exhibits appropriate insight and judgement.   ____________________________________________   LABS (all labs ordered are listed, but only abnormal results are displayed)  Labs Reviewed - No data to display ____________________________________________  EKG   ____________________________________________  RADIOLOGY Robinette Haines, personally viewed and evaluated these images (plain radiographs) as part of my medical decision making, as well as reviewing the written report by the radiologist.  Dg Pelvis 1-2 Views  Result Date: 09/23/2018 CLINICAL DATA:  Fall. EXAM: PELVIS - 1-2 VIEW COMPARISON:  CT 10/25/2017.  CT 10/06/2016. FINDINGS: Metallic density noted pelvis. No bowel distention. Stool noted  throughout the colon. Degenerative change lumbar spine and both hips. No acute bony abnormality. IMPRESSION: Degenerative changes lumbar spine and both hips. No acute bony abnormality. No evidence of fracture or dislocation. Electronically Signed   By: Marcello Moores  Register   On: 09/23/2018 09:29   Dg Tibia/fibula Left  Result Date: 09/23/2018 CLINICAL DATA:  Fall. EXAM: LEFT TIBIA AND FIBULA - 2 VIEW COMPARISON:  01/18/2012. FINDINGS: Diffuse osteopenia degenerative change. No acute bony or joint abnormality identified. No evidence of fracture dislocation. IMPRESSION: No acute abnormality identified. Electronically Signed   By: Marcello Moores  Register   On: 09/23/2018 10:47   Dg Femur Min 2 Views Left  Result Date: 09/23/2018 CLINICAL DATA:  Fall with left leg pain. EXAM: LEFT FEMUR 2 VIEWS COMPARISON:  None. FINDINGS: No evidence of acute fracture involving the left femur. Alignment at the knee and hip grossly intact without subluxation or dislocation. Mild osteoarthritis identified at the level of  the hip and knee. There may be some soft tissue swelling along the medial aspect of the distal thigh and knee. No bony lesions or destruction. IMPRESSION: No acute fracture identified. Suggestion of soft tissue swelling along the medial aspect of the distal thigh and knee. Electronically Signed   By: Aletta Edouard M.D.   On: 09/23/2018 09:29    ____________________________________________    PROCEDURES  Procedure(s) performed:    Procedures    Medications  morphine 2 MG/ML injection 2 mg (2 mg Intramuscular Given 09/23/18 0908)  oxyCODONE-acetaminophen (PERCOCET/ROXICET) 5-325 MG per tablet 1 tablet (1 tablet Oral Given 09/23/18 1044)  ketorolac (TORADOL) 30 MG/ML injection 30 mg (30 mg Intramuscular Given 09/23/18 1105)     ____________________________________________   INITIAL IMPRESSION / ASSESSMENT AND PLAN / ED COURSE  Pertinent labs & imaging results that were available during my care of the patient were reviewed by me and considered in my medical decision making (see chart for details).  Review of the [place] CSRS was performed in accordance of the [place] prior to dispensing any controlled drugs.     Patient presented to emergency department for evaluation after fall.  Vital signs and exam are reassuring.  Pelvis x-ray, femur x-ray, tibia-fibula x-ray are negative for acute bony abnormalities.  Femur x-ray shows some mild soft tissue swelling to distal thigh and knee.  Patient is weightbearing and able to get herself from the bed to a chair.  Pain improved with morphine, Percocet, Toradol.  Patient will be discharged home with prescriptions for a short course of Percocet. Patient is to follow up with primary care as directed. Patient is given ED precautions to return to the ED for any worsening or new symptoms.     ____________________________________________  FINAL CLINICAL IMPRESSION(S) / ED DIAGNOSES  Final diagnoses:  Fall, initial encounter      NEW MEDICATIONS  STARTED DURING THIS VISIT:  ED Discharge Orders         Ordered    oxyCODONE-acetaminophen (PERCOCET) 5-325 MG tablet  Every 6 hours PRN     09/23/18 1128              This chart was dictated using voice recognition software/Dragon. Despite best efforts to proofread, errors can occur which can change the meaning. Any change was purely unintentional.    Laban Emperor, PA-C 09/23/18 1447    Nena Polio, MD 09/23/18 [phone]

## 2018-09-23 NOTE — Discharge Instructions (Addendum)
Your x-rays do not show anything that is broken.  Please ice and elevate leg today.  You can take Percocet every 6 hours as needed for pain.  Please use walker and keep leg wrapped with ace bandage for swelling.  Call primary care for appointment early next week.

## 2018-09-24 ENCOUNTER — Emergency Department
Admission: EM | Admit: 2018-09-24 | Discharge: 2018-09-24 | Disposition: A | Discharge status: Home / Self Care | Payer: PRIVATE HEALTH INSURANCE | Attending: Emergency Medicine | Admitting: Emergency Medicine

## 2018-09-24 ENCOUNTER — Encounter: Payer: Self-pay | Admitting: Emergency Medicine

## 2018-09-24 ENCOUNTER — Other Ambulatory Visit: Payer: Self-pay

## 2018-09-24 DIAGNOSIS — R600 Localized edema: Secondary | ICD-10-CM

## 2018-09-24 DIAGNOSIS — E039 Hypothyroidism, unspecified: Secondary | ICD-10-CM | POA: Insufficient documentation

## 2018-09-24 DIAGNOSIS — Z79899 Other long term (current) drug therapy: Secondary | ICD-10-CM | POA: Diagnosis not present

## 2018-09-24 DIAGNOSIS — R2242 Localized swelling, mass and lump, left lower limb: Secondary | ICD-10-CM | POA: Diagnosis not present

## 2018-09-24 DIAGNOSIS — E119 Type 2 diabetes mellitus without complications: Secondary | ICD-10-CM | POA: Insufficient documentation

## 2018-09-24 DIAGNOSIS — Z7984 Long term (current) use of oral hypoglycemic drugs: Secondary | ICD-10-CM | POA: Insufficient documentation

## 2018-09-24 DIAGNOSIS — M25562 Pain in left knee: Secondary | ICD-10-CM | POA: Insufficient documentation

## 2018-09-24 NOTE — ED Triage Notes (Signed)
Patient presents to the ED with left leg pain post fall yesterday.  Patient states she was injured while working at [employer].  Patient saw a doctor yesterday and had x-rays performed patient states, "they told me nothing was broken, but I woke up this morning and everything was very swollen and painful.  Patient having difficulty walking.

## 2018-09-24 NOTE — Discharge Instructions (Signed)
Advise no compression wraps to left lower extremity.  Use knee immobilizer for ambulation.  Advised to keep left lower extremity elevated when sitting or laying.  Continue previous medications.

## 2018-09-24 NOTE — ED Provider Notes (Signed)
[facility] Emergency Department Provider Note   ____________________________________________   First MD Initiated Contact with Patient 09/24/18 [phone]     (approximate)  I have reviewed the triage vital signs and the nursing notes.   HISTORY  Chief Complaint Knee Pain and Leg Pain    HPI [name] is a 63 y.o. female patient presents with left lower foot edema.  Patient was seen this facility yesterday for left knee pain secondary to contusion.  Patient had numerous x-rays was all without acute findings.  Patient was placed in a Ace wrap and given medication.  Further history showed the patient removed and reapplied Ace wraps last night.  Patient awake with increased edema and pain with ambulation.  Patient rates her pain as a 10/10.  Patient described pain is "achy".         Past Medical History:  Diagnosis Date  . Arthritis   . Blood dyscrasia    patient stated she is a "free bleeder"  . Diabetes mellitus without complication ([place])   . Diverticulosis   . Hypothyroidism   . Neuropathy     Patient Active Problem List   Diagnosis Date Noted  . Post-operative state 03/16/2017  . Acute renal failure (ARF) ([place]) 01/18/2017  . Acute respiratory failure with hypoxia ([place]) 11/04/2015    Past Surgical History:  Procedure Laterality Date  . ABDOMINAL HYSTERECTOMY    . BILATERAL SALPINGECTOMY Bilateral 03/16/2017   Procedure: BILATERAL SALPINGECTOMY;  Surgeon: Schermerhorn, Gwen Her, MD;  Location: [facility] ORS;  Service: Gynecology;  Laterality: Bilateral;  . COLONOSCOPY WITH PROPOFOL N/A 08/18/2018   Procedure: COLONOSCOPY WITH PROPOFOL;  Surgeon: Manya Silvas, MD;  Location: [facility] ENDOSCOPY;  Service: Endoscopy;  Laterality: N/A;  . CYSTOCELE REPAIR N/A 03/16/2017   Procedure: ANTERIOR REPAIR (CYSTOCELE);  Surgeon: Schermerhorn, Gwen Her, MD;  Location: [facility] ORS;  Service: Gynecology;  Laterality: N/A;  . ESOPHAGOGASTRODUODENOSCOPY (EGD) WITH  PROPOFOL N/A 08/18/2018   Procedure: ESOPHAGOGASTRODUODENOSCOPY (EGD) WITH PROPOFOL;  Surgeon: Manya Silvas, MD;  Location: [facility] ENDOSCOPY;  Service: Endoscopy;  Laterality: N/A;  . TEAR DUCT PROBING Right   . TUBAL LIGATION    . VAGINAL HYSTERECTOMY N/A 03/16/2017   Procedure: HYSTERECTOMY VAGINAL;  Surgeon: Schermerhorn, Gwen Her, MD;  Location: [facility] ORS;  Service: Gynecology;  Laterality: N/A;  . WISDOM TOOTH EXTRACTION      Prior to Admission medications   Medication Sig Start Date End Date Taking? Authorizing Provider  oxyCODONE-acetaminophen (PERCOCET/ROXICET) 5-325 MG tablet Take 1 tablet by mouth every 4 (four) hours as needed for severe pain.   Yes [provider]  gabapentin (NEURONTIN) 100 MG capsule Take 200 mg by mouth 2 (two) times daily.  08/16/15   [provider]  glipiZIDE (GLUCOTROL) 5 MG tablet Take 5 mg by mouth 2 (two) times daily before a meal.  06/02/16   [provider]  ibuprofen (ADVIL,MOTRIN) 200 MG tablet Take 200 mg by mouth every 6 (six) hours as needed for moderate pain.    [provider]  levothyroxine (SYNTHROID, LEVOTHROID) 75 MCG tablet Take 75 mcg by mouth daily.  10/12/17   [provider]  metFORMIN (GLUCOPHAGE-XR) 500 MG 24 hr tablet Take 1,000 mg by mouth daily.  08/31/15   [provider]  naproxen sodium (ALEVE) 220 MG tablet Take 220 mg by mouth 2 (two) times daily as needed (foot pain).    [provider]  omeprazole (PRILOSEC) 40 MG capsule Take 40 mg by mouth  daily. Take 30 min after Synthroid and 30 min before breakfast    [provider]  ondansetron (ZOFRAN) 4 MG tablet Take 1 tablet (4 mg total) by mouth every 8 (eight) hours as needed for nausea or vomiting. 10/25/17   Schuyler Amor, MD  tolterodine (DETROL) 2 MG tablet Take 2 mg by mouth daily.     [provider]    Allergies Patient has no known allergies.  Family History  Problem Relation Age of Onset   . Breast cancer Maternal Aunt   . Hypertension Mother   . Hypertension Father   . Diabetes Father     Social History Social History   Tobacco Use  . Smoking status: Never Smoker  . Smokeless tobacco: Never Used  Substance Use Topics  . Alcohol use: No  . Drug use: No    Review of Systems Constitutional: No fever/chills Eyes: No visual changes. ENT: No sore throat. Cardiovascular: Denies chest pain. Respiratory: Denies shortness of breath. Gastrointestinal: No abdominal pain.  No nausea, no vomiting.  No diarrhea.  No constipation. Genitourinary: Negative for dysuria. Musculoskeletal: Left knee and foot pain.   Skin: Negative for rash. Neurological: Negative for headaches, focal weakness or numbness. Endocrine:  Diabetes and hypothyroidism.   ____________________________________________   PHYSICAL EXAM:  VITAL SIGNS: ED Triage Vitals [09/24/18 0928]  Enc Vitals Group     BP (!) 145/56     Pulse Rate 81     Resp 18     Temp 98.2 F (36.8 C)     Temp Source Oral     SpO2 98 %     Weight 144 lb (65.3 kg)     Height 5\' 6"  (1.676 m)     Head Circumference      Peak Flow      Pain Score 10     Pain Loc      Pain Edu?      Excl. in [place]?    Constitutional: Alert and oriented. Well appearing and in no acute distress. Cardiovascular: Normal rate, regular rhythm. Grossly normal heart sounds.  Good peripheral circulation. Respiratory: Normal respiratory effort.  No retractions. Lungs CTAB. Gastrointestinal: Soft and nontender. No distention. No abdominal bruits. No CVA tenderness. Musculoskeletal: Edema to left knee and foot.   Neurologic:  Normal speech and language. No gross focal neurologic deficits are appreciated. No gait instability. Skin:  Skin is warm, dry and intact. No rash noted.  Edema to left foot secondary to tight compression wrap of the left lower extremity. Psychiatric: Mood and affect are normal. Speech and behavior are normal.   ____________________________________________   LABS (all labs ordered are listed, but only abnormal results are displayed)  Labs Reviewed - No data to display ____________________________________________  EKG   ____________________________________________  RADIOLOGY  ED MD interpretation:    Official radiology report(s): Dg Tibia/fibula Left  Result Date: 09/23/2018 CLINICAL DATA:  Fall. EXAM: LEFT TIBIA AND FIBULA - 2 VIEW COMPARISON:  01/18/2012. FINDINGS: Diffuse osteopenia degenerative change. No acute bony or joint abnormality identified. No evidence of fracture dislocation. IMPRESSION: No acute abnormality identified. Electronically Signed   By: Marcello Moores  Register   On: 09/23/2018 10:47    ____________________________________________   PROCEDURES  Procedure(s) performed (including Critical Care):  Procedures   ____________________________________________   INITIAL IMPRESSION / ASSESSMENT AND PLAN / ED COURSE  As part of my medical decision making, I reviewed the following data within the [place]  Patient presents with continued left knee pain secondary to contusion.  Patient has also developed edema to the right foot which was not noticed on the initial exam yesterday.  His edema is consistent with tight compression wrapping of the left lower extremity.  Peripheral pulses are intact.  Patient will discontinue use of compression wraps.  Patient placed in a knee immobilizer.  Patient advised to keep the extremity elevated with sitting or laying.  Patient advised follow-up PCP.      ____________________________________________   FINAL CLINICAL IMPRESSION(S) / ED DIAGNOSES  Final diagnoses:  Edema of left foot     ED Discharge Orders    None       Note:  This document was prepared using Dragon voice recognition software and may include unintentional dictation errors.    Sable Feil, PA-C 09/24/18 2023    Lavonia Drafts, MD 09/27/18 [phone]

## 2018-09-24 NOTE — ED Notes (Signed)
See triage note  Presents with swelling and pain to left lower leg  Positive swelling noted  Ace wrap removed  Bruising noted at knee  Good pulses in lower leg/foot

## 2018-11-28 ENCOUNTER — Emergency Department
Admission: EM | Admit: 2018-11-28 | Discharge: 2018-11-28 | Disposition: A | Discharge status: Home / Self Care | Payer: BLUE CROSS/BLUE SHIELD | Attending: Emergency Medicine | Admitting: Emergency Medicine

## 2018-11-28 ENCOUNTER — Emergency Department: Payer: BLUE CROSS/BLUE SHIELD

## 2018-11-28 ENCOUNTER — Other Ambulatory Visit: Payer: Self-pay

## 2018-11-28 DIAGNOSIS — E119 Type 2 diabetes mellitus without complications: Secondary | ICD-10-CM | POA: Diagnosis not present

## 2018-11-28 DIAGNOSIS — R109 Unspecified abdominal pain: Secondary | ICD-10-CM | POA: Diagnosis present

## 2018-11-28 DIAGNOSIS — E039 Hypothyroidism, unspecified: Secondary | ICD-10-CM | POA: Insufficient documentation

## 2018-11-28 DIAGNOSIS — N12 Tubulo-interstitial nephritis, not specified as acute or chronic: Secondary | ICD-10-CM | POA: Diagnosis not present

## 2018-11-28 DIAGNOSIS — N390 Urinary tract infection, site not specified: Secondary | ICD-10-CM | POA: Diagnosis not present

## 2018-11-28 DIAGNOSIS — Z7984 Long term (current) use of oral hypoglycemic drugs: Secondary | ICD-10-CM | POA: Diagnosis not present

## 2018-11-28 DIAGNOSIS — Z79899 Other long term (current) drug therapy: Secondary | ICD-10-CM | POA: Insufficient documentation

## 2018-11-28 HISTORY — DX: Disorder of kidney and ureter, unspecified: N28.9

## 2018-11-28 LAB — URINALYSIS, COMPLETE (UACMP) WITH MICROSCOPIC
Bilirubin Urine: NEGATIVE
Glucose, UA: NEGATIVE mg/dL
Ketones, ur: NEGATIVE mg/dL
Nitrite: NEGATIVE
Protein, ur: NEGATIVE mg/dL
Specific Gravity, Urine: 1.018 (ref 1.005–1.030)
WBC, UA: >50 WBC/hpf — ABNORMAL HIGH (ref 0–5)
pH: 5 (ref 5.0–8.0)

## 2018-11-28 LAB — CBC
HCT: 35.8 % — ABNORMAL LOW (ref 36.0–46.0)
Hemoglobin: 11.9 g/dL — ABNORMAL LOW (ref 12.0–15.0)
MCH: 28.7 pg (ref 26.0–34.0)
MCHC: 33.2 g/dL (ref 30.0–36.0)
MCV: 86.5 fL (ref 80.0–100.0)
Platelets: 160 10*3/uL (ref 150–400)
RBC: 4.14 MIL/uL (ref 3.87–5.11)
RDW: 13.2 % (ref 11.5–15.5)
WBC: 7 10*3/uL (ref 4.0–10.5)
nRBC: 0 % (ref 0.0–0.2)

## 2018-11-28 LAB — BASIC METABOLIC PANEL
Anion gap: 10 (ref 5–15)
BUN: 39 mg/dL — ABNORMAL HIGH (ref 6–20)
CO2: 21 mmol/L — ABNORMAL LOW (ref 22–32)
Calcium: 9.1 mg/dL (ref 8.9–10.3)
Chloride: 108 mmol/L (ref 98–111)
Creatinine, Ser: 1.65 mg/dL — ABNORMAL HIGH (ref 0.44–1.00)
GFR calc Af Amer: 39 mL/min — ABNORMAL LOW (ref >60)
GFR calc non Af Amer: 33 mL/min — ABNORMAL LOW (ref >60)
Glucose, Bld: 137 mg/dL — ABNORMAL HIGH (ref 70–99)
Potassium: 4.8 mmol/L (ref 3.5–5.1)
Sodium: 139 mmol/L (ref 135–145)

## 2018-11-28 MED ORDER — SODIUM CHLORIDE 0.9 % IV SOLN
1.0000 g | Freq: Once | INTRAVENOUS | Status: AC
  Administered 2018-11-28: 1 g via INTRAVENOUS
  Filled 2018-11-28: qty 10

## 2018-11-28 MED ORDER — IOHEXOL 300 MG/ML  SOLN
75.0000 mL | Freq: Once | INTRAMUSCULAR | Status: AC | PRN
  Administered 2018-11-28: 17:00:00 75 mL via INTRAVENOUS

## 2018-11-28 MED ORDER — SODIUM CHLORIDE 0.9 % IV BOLUS
1000.0000 mL | Freq: Once | INTRAVENOUS | Status: AC
  Administered 2018-11-28: 17:00:00 1000 mL via INTRAVENOUS

## 2018-11-28 MED ORDER — CEPHALEXIN 500 MG PO CAPS: 500.0000 mg | ORAL_CAPSULE | Freq: Three times a day (TID) | ORAL | 0 refills | Status: DC

## 2018-11-28 NOTE — Discharge Instructions (Signed)
Please seek medical attention for any high fevers, chest pain, shortness of breath, change in behavior, persistent vomiting, bloody stool or any other new or concerning symptoms.  

## 2018-11-28 NOTE — ED Provider Notes (Signed)
[facility] Emergency Department Provider Note   ____________________________________________   I have reviewed the triage vital signs and the nursing notes.   HISTORY  Chief Complaint Flank Pain   History limited by: Not Limited   HPI [name] is a 63 y.o. female who presents to the emergency department today because of concerns for worsening left flank pain.  Patient states that she has had problems with her left flank for the past few months.  Today however the pain was more severe.  The pain was also accompanied by weakness.  Patient states that she was told she had stage II renal disease from her primary care doctor.  She has been trying to drink fluids today.  She denies any urinary discomfort or bad odor to her urine.  She denies any fevers.   Records reviewed. Per medical record review patient has a history of acute renal failure  Past Medical History:  Diagnosis Date  . Arthritis   . Blood dyscrasia    patient stated she is a "free bleeder"  . Diabetes mellitus without complication ([place])   . Diverticulosis   . Hypothyroidism   . Neuropathy     Patient Active Problem List   Diagnosis Date Noted  . Post-operative state 03/16/2017  . Acute renal failure (ARF) ([place]) 01/18/2017  . Acute respiratory failure with hypoxia ([place]) 11/04/2015    Past Surgical History:  Procedure Laterality Date  . ABDOMINAL HYSTERECTOMY    . BILATERAL SALPINGECTOMY Bilateral 03/16/2017   Procedure: BILATERAL SALPINGECTOMY;  Surgeon: Schermerhorn, Gwen Her, MD;  Location: [facility] ORS;  Service: Gynecology;  Laterality: Bilateral;  . COLONOSCOPY WITH PROPOFOL N/A 08/18/2018   Procedure: COLONOSCOPY WITH PROPOFOL;  Surgeon: Manya Silvas, MD;  Location: [facility] ENDOSCOPY;  Service: Endoscopy;  Laterality: N/A;  . CYSTOCELE REPAIR N/A 03/16/2017   Procedure: ANTERIOR REPAIR (CYSTOCELE);  Surgeon: Schermerhorn, Gwen Her, MD;  Location: [facility] ORS;  Service: Gynecology;   Laterality: N/A;  . ESOPHAGOGASTRODUODENOSCOPY (EGD) WITH PROPOFOL N/A 08/18/2018   Procedure: ESOPHAGOGASTRODUODENOSCOPY (EGD) WITH PROPOFOL;  Surgeon: Manya Silvas, MD;  Location: [facility] ENDOSCOPY;  Service: Endoscopy;  Laterality: N/A;  . TEAR DUCT PROBING Right   . TUBAL LIGATION    . VAGINAL HYSTERECTOMY N/A 03/16/2017   Procedure: HYSTERECTOMY VAGINAL;  Surgeon: Schermerhorn, Gwen Her, MD;  Location: [facility] ORS;  Service: Gynecology;  Laterality: N/A;  . WISDOM TOOTH EXTRACTION      Prior to Admission medications   Medication Sig Start Date End Date Taking? Authorizing Provider  gabapentin (NEURONTIN) 100 MG capsule Take 200 mg by mouth 2 (two) times daily.  08/16/15   [provider]  glipiZIDE (GLUCOTROL) 5 MG tablet Take 5 mg by mouth 2 (two) times daily before a meal.  06/02/16   [provider]  ibuprofen (ADVIL,MOTRIN) 200 MG tablet Take 200 mg by mouth every 6 (six) hours as needed for moderate pain.    [provider]  levothyroxine (SYNTHROID, LEVOTHROID) 75 MCG tablet Take 75 mcg by mouth daily.  10/12/17   [provider]  metFORMIN (GLUCOPHAGE-XR) 500 MG 24 hr tablet Take 1,000 mg by mouth daily.  08/31/15   [provider]  naproxen sodium (ALEVE) 220 MG tablet Take 220 mg by mouth 2 (two) times daily as needed (foot pain).    [provider]  omeprazole (PRILOSEC) 40 MG capsule Take 40 mg by mouth daily. Take 30 min after Synthroid and 30 min before breakfast    [provider]  ondansetron (ZOFRAN) 4 MG tablet Take 1 tablet (4 mg total) by mouth every 8 (eight) hours as needed for nausea or vomiting. 10/25/17   Schuyler Amor, MD  oxyCODONE-acetaminophen (PERCOCET/ROXICET) 5-325 MG tablet Take 1 tablet by mouth every 4 (four) hours as needed for severe pain.    [provider]  tolterodine (DETROL) 2 MG tablet Take 2 mg by mouth daily.     [provider]    Allergies Patient has no known  allergies.  Family History  Problem Relation Age of Onset  . Breast cancer Maternal Aunt   . Hypertension Mother   . Hypertension Father   . Diabetes Father     Social History Social History   Tobacco Use  . Smoking status: Never Smoker  . Smokeless tobacco: Never Used  Substance Use Topics  . Alcohol use: No  . Drug use: No    Review of Systems Constitutional: No fever/chills Eyes: No visual changes. ENT: No sore throat. Cardiovascular: Denies chest pain. Respiratory: Denies shortness of breath. Gastrointestinal: Positive for left flank pain. Genitourinary: Negative for dysuria. Musculoskeletal: Negative for back pain. Skin: Negative for rash. Neurological: Negative for headaches, focal weakness or numbness.  ____________________________________________   PHYSICAL EXAM:  VITAL SIGNS: ED Triage Vitals  Enc Vitals Group     BP 11/28/18 1604 (!) 148/72     Pulse Rate 11/28/18 1604 90     Resp 11/28/18 1604 20     Temp 11/28/18 1604 97.7 F (36.5 C)     Temp Source 11/28/18 1604 Oral     SpO2 11/28/18 1604 99 %     Weight 11/28/18 1611 144 lb (65.3 kg)     Height 11/28/18 1611 5\' 7"  (1.702 m)     Head Circumference --      Peak Flow --      Pain Score 11/28/18 1611 10   Constitutional: Alert and oriented.  Eyes: Conjunctivae are normal.  ENT      Head: Normocephalic and atraumatic.      Nose: No congestion/rhinnorhea.      Mouth/Throat: Mucous membranes are moist.      Neck: No stridor. Hematological/Lymphatic/Immunilogical: No cervical lymphadenopathy. Cardiovascular: Normal rate, regular rhythm.  No murmurs, rubs, or gallops.  Respiratory: Normal respiratory effort without tachypnea nor retractions. Breath sounds are clear and equal bilaterally. No wheezes/rales/rhonchi. Gastrointestinal: Soft and tender to palpation in the left abdomen.  Genitourinary: Deferred Musculoskeletal: Normal range of motion in all extremities. No lower extremity  edema. Neurologic:  Normal speech and language. No gross focal neurologic deficits are appreciated.  Skin:  Skin is warm, dry and intact. No rash noted. Psychiatric: Mood and affect are normal. Speech and behavior are normal. Patient exhibits appropriate insight and judgment.  ____________________________________________    LABS (pertinent positives/negatives)  CBC wbc 7.0, hgb 11.9, plt 160 BMP na 139, k 4.8, cr 1.65 UA cloudy, large leukocytes, >50 wbc, 11-20 rbc  ____________________________________________   EKG  None  ____________________________________________    RADIOLOGY  CT abd/pel No acute abnormality  ____________________________________________   PROCEDURES  Procedures  ____________________________________________   INITIAL IMPRESSION / ASSESSMENT AND PLAN / ED COURSE  Pertinent labs & imaging results that were available during my care of the patient were reviewed by me and considered in my medical decision making (see chart for details).   Patient presented to the emergency department today because of concerns for left flank pain.  Patient states she has been having some pain  for quite some time but became more severe today.  She is also feeling some weakness.  Urine is concerning for an infection.  Given left flank pain did have concern for pyelonephritis. CT scan was obtained which did not show any acute abnormality. Will give patient dose of IV abx here.  Discussed findings and plan with patient.  Creatinine was slightly elevated although she states she has a history of renal issues and appears that she has had some elevation in the past.  Did give dose of IV fluids.  Discussed with patient portance of primary care follow-up.  ____________________________________________   FINAL CLINICAL IMPRESSION(S) / ED DIAGNOSES  Final diagnoses:  Pyelonephritis  Lower urinary tract infectious disease  Left flank pain     Note: This dictation was prepared  with Dragon dictation. Any transcriptional errors that result from this process are unintentional     Nance Pear, MD 11/28/18 1956

## 2018-11-28 NOTE — ED Triage Notes (Addendum)
Pt arrived via [facility] c/o left flank pain and abd pain. Pt has a hx of stage 3 renal issues. Pt states that she passed out at [facility] while working. Vitals WNL.   Pt also states that she has been having diarrhea 2-3 times a day.

## 2018-11-28 NOTE — ED Notes (Signed)
Patient transported to CT 

## 2018-11-28 NOTE — ED Notes (Signed)
Signature pad not working. Hard copy printed and signed by patient.  

## 2018-11-28 NOTE — ED Notes (Signed)
Pt verbalized understanding of discharge instructions. NAD at this time. 

## 2019-04-11 ENCOUNTER — Emergency Department
Admission: EM | Admit: 2019-04-11 | Discharge: 2019-04-11 | Disposition: A | Discharge status: Home / Self Care | Payer: BC Managed Care – PPO | Attending: Student | Admitting: Student

## 2019-04-11 ENCOUNTER — Other Ambulatory Visit: Payer: Self-pay

## 2019-04-11 ENCOUNTER — Emergency Department: Payer: BC Managed Care – PPO

## 2019-04-11 DIAGNOSIS — E039 Hypothyroidism, unspecified: Secondary | ICD-10-CM | POA: Diagnosis not present

## 2019-04-11 DIAGNOSIS — K859 Acute pancreatitis without necrosis or infection, unspecified: Secondary | ICD-10-CM | POA: Diagnosis not present

## 2019-04-11 DIAGNOSIS — E119 Type 2 diabetes mellitus without complications: Secondary | ICD-10-CM | POA: Insufficient documentation

## 2019-04-11 DIAGNOSIS — Z7984 Long term (current) use of oral hypoglycemic drugs: Secondary | ICD-10-CM | POA: Diagnosis not present

## 2019-04-11 DIAGNOSIS — R1013 Epigastric pain: Secondary | ICD-10-CM

## 2019-04-11 DIAGNOSIS — R198 Other specified symptoms and signs involving the digestive system and abdomen: Secondary | ICD-10-CM

## 2019-04-11 DIAGNOSIS — R0789 Other chest pain: Secondary | ICD-10-CM | POA: Diagnosis not present

## 2019-04-11 DIAGNOSIS — Z79899 Other long term (current) drug therapy: Secondary | ICD-10-CM | POA: Insufficient documentation

## 2019-04-11 LAB — BASIC METABOLIC PANEL
Anion gap: 10 (ref 5–15)
BUN: 28 mg/dL — ABNORMAL HIGH (ref 8–23)
CO2: 23 mmol/L (ref 22–32)
Calcium: 9.3 mg/dL (ref 8.9–10.3)
Chloride: 104 mmol/L (ref 98–111)
Creatinine, Ser: 1.19 mg/dL — ABNORMAL HIGH (ref 0.44–1.00)
GFR calc Af Amer: 57 mL/min — ABNORMAL LOW (ref >60)
GFR calc non Af Amer: 49 mL/min — ABNORMAL LOW (ref >60)
Glucose, Bld: 195 mg/dL — ABNORMAL HIGH (ref 70–99)
Potassium: 4.1 mmol/L (ref 3.5–5.1)
Sodium: 137 mmol/L (ref 135–145)

## 2019-04-11 LAB — HEPATIC FUNCTION PANEL
ALT: 23 U/L (ref 0–44)
AST: 29 U/L (ref 15–41)
Albumin: 4.2 g/dL (ref 3.5–5.0)
Alkaline Phosphatase: 91 U/L (ref 38–126)
Bilirubin, Direct: <0.1 mg/dL (ref 0.0–0.2)
Total Bilirubin: 0.6 mg/dL (ref 0.3–1.2)
Total Protein: 7.9 g/dL (ref 6.5–8.1)

## 2019-04-11 LAB — CBC
HCT: 37.7 % (ref 36.0–46.0)
Hemoglobin: 12.8 g/dL (ref 12.0–15.0)
MCH: 28.7 pg (ref 26.0–34.0)
MCHC: 34 g/dL (ref 30.0–36.0)
MCV: 84.5 fL (ref 80.0–100.0)
Platelets: 167 10*3/uL (ref 150–400)
RBC: 4.46 MIL/uL (ref 3.87–5.11)
RDW: 13 % (ref 11.5–15.5)
WBC: 6.1 10*3/uL (ref 4.0–10.5)
nRBC: 0 % (ref 0.0–0.2)

## 2019-04-11 LAB — LIPASE, BLOOD: Lipase: 90 U/L — ABNORMAL HIGH (ref 11–51)

## 2019-04-11 LAB — TROPONIN I (HIGH SENSITIVITY)
Troponin I (High Sensitivity): 5 ng/L (ref <18)
Troponin I (High Sensitivity): 5 ng/L (ref <18)

## 2019-04-11 MED ORDER — ONDANSETRON 4 MG PO TBDP
4.0000 mg | ORAL_TABLET | Freq: Once | ORAL | Status: AC
  Administered 2019-04-11: 4 mg via ORAL
  Filled 2019-04-11: qty 1

## 2019-04-11 MED ORDER — SODIUM CHLORIDE 0.9% FLUSH
3.0000 mL | Freq: Once | INTRAVENOUS | Status: AC
  Administered 2019-04-11: 3 mL via INTRAVENOUS

## 2019-04-11 MED ORDER — OMEPRAZOLE 40 MG PO CPDR: 40.0000 mg | DELAYED_RELEASE_CAPSULE | Freq: Every day | ORAL | 0 refills | Status: DC

## 2019-04-11 MED ORDER — ASPIRIN 81 MG PO CHEW
325.0000 mg | CHEWABLE_TABLET | Freq: Once | ORAL | Status: AC
  Administered 2019-04-11: 324 mg via ORAL
  Filled 2019-04-11: qty 5

## 2019-04-11 MED ORDER — SODIUM CHLORIDE 0.9 % IV BOLUS
500.0000 mL | Freq: Once | INTRAVENOUS | Status: AC
  Administered 2019-04-11: 500 mL via INTRAVENOUS

## 2019-04-11 MED ORDER — ALUM & MAG HYDROXIDE-SIMETH 200-200-20 MG/5ML PO SUSP
30.0000 mL | Freq: Once | ORAL | Status: AC
  Administered 2019-04-11: 30 mL via ORAL
  Filled 2019-04-11: qty 30

## 2019-04-11 NOTE — ED Triage Notes (Signed)
Pt to the er for chest pain that is left chest with sob, neausea and vomiting x 1 hour. Pt describes pain as tightness.

## 2019-04-11 NOTE — Discharge Instructions (Signed)
Thank you for letting us take care of you in the emergency department today.   Please continue to take any regular, prescribed medications. For the next few days, trial a clear liquid diet (soup, broth, jello, pudding, etc) and then slowly advance your diet. Avoid spicy/fried foods.   New medications we have prescribed:  - Omeprazole - to help with your reflux  Please follow up with: - A cardiology doctor, information for one is below - Your primary care doctor to review your ER visit and follow up on your symptoms.   Please return to the ER for any new or worsening symptoms.

## 2019-04-11 NOTE — ED Provider Notes (Signed)
[facility] Emergency Department Provider Note  ____________________________________________   First MD Initiated Contact with Patient 04/11/19 1642     (approximate)  I have reviewed the triage vital signs and the nursing notes.  History  Chief Complaint Chest Pain    HPI [name] is a 63 y.o. female with history of DM, hypothyroidism, renal insufficiency who presents for epigastric pain and reflux.  Patient states she was eating lunch today after which she developed epigastric abdominal discomfort and reflux.  She describes it as a burning sensation and can feel it rise from her epigastric area into her chest.  She has some mild associated shortness of breath that she attributes to the discomfort.  No nausea or vomiting or diarrhea.  She denies any history of CAD or tobacco use.  She does not take any daily medications for her reflux symptoms.   Past Medical Hx Past Medical History:  Diagnosis Date  . Arthritis   . Blood dyscrasia    patient stated she is a "free bleeder"  . Diabetes mellitus without complication ([place])   . Diverticulosis   . Hypothyroidism   . Neuropathy   . Renal insufficiency     Problem List Patient Active Problem List   Diagnosis Date Noted  . Post-operative state 03/16/2017  . Acute renal failure (ARF) ([place]) 01/18/2017  . Acute respiratory failure with hypoxia ([place]) 11/04/2015    Past Surgical Hx Past Surgical History:  Procedure Laterality Date  . ABDOMINAL HYSTERECTOMY    . BILATERAL SALPINGECTOMY Bilateral 03/16/2017   Procedure: BILATERAL SALPINGECTOMY;  Surgeon: Schermerhorn, Gwen Her, MD;  Location: [facility] ORS;  Service: Gynecology;  Laterality: Bilateral;  . COLONOSCOPY WITH PROPOFOL N/A 08/18/2018   Procedure: COLONOSCOPY WITH PROPOFOL;  Surgeon: Manya Silvas, MD;  Location: [facility] ENDOSCOPY;  Service: Endoscopy;  Laterality: N/A;  . CYSTOCELE REPAIR N/A 03/16/2017   Procedure: ANTERIOR REPAIR (CYSTOCELE);   Surgeon: Schermerhorn, Gwen Her, MD;  Location: [facility] ORS;  Service: Gynecology;  Laterality: N/A;  . ESOPHAGOGASTRODUODENOSCOPY (EGD) WITH PROPOFOL N/A 08/18/2018   Procedure: ESOPHAGOGASTRODUODENOSCOPY (EGD) WITH PROPOFOL;  Surgeon: Manya Silvas, MD;  Location: [facility] ENDOSCOPY;  Service: Endoscopy;  Laterality: N/A;  . TEAR DUCT PROBING Right   . TUBAL LIGATION    . VAGINAL HYSTERECTOMY N/A 03/16/2017   Procedure: HYSTERECTOMY VAGINAL;  Surgeon: Schermerhorn, Gwen Her, MD;  Location: [facility] ORS;  Service: Gynecology;  Laterality: N/A;  . WISDOM TOOTH EXTRACTION      Medications Prior to Admission medications   Medication Sig Start Date End Date Taking? Authorizing Provider  cephALEXin (KEFLEX) 500 MG capsule Take 1 capsule (500 mg total) by mouth 3 (three) times daily. 11/28/18   Nance Pear, MD  gabapentin (NEURONTIN) 100 MG capsule Take 200 mg by mouth 2 (two) times daily.  08/16/15   [provider]  glipiZIDE (GLUCOTROL) 5 MG tablet Take 5 mg by mouth 2 (two) times daily before a meal.  06/02/16   [provider]  ibuprofen (ADVIL,MOTRIN) 200 MG tablet Take 200 mg by mouth every 6 (six) hours as needed for moderate pain.    [provider]  levothyroxine (SYNTHROID, LEVOTHROID) 75 MCG tablet Take 75 mcg by mouth daily.  10/12/17   [provider]  metFORMIN (GLUCOPHAGE-XR) 500 MG 24 hr tablet Take 1,000 mg by mouth daily.  08/31/15   [provider]  naproxen sodium (ALEVE) 220 MG tablet Take 220 mg by mouth 2 (two) times daily as needed (foot pain).  [provider]  omeprazole (PRILOSEC) 40 MG capsule Take 40 mg by mouth daily. Take 30 min after Synthroid and 30 min before breakfast    [provider]  ondansetron (ZOFRAN) 4 MG tablet Take 1 tablet (4 mg total) by mouth every 8 (eight) hours as needed for nausea or vomiting. 10/25/17   Schuyler Amor, MD  oxyCODONE-acetaminophen (PERCOCET/ROXICET) 5-325 MG tablet Take 1  tablet by mouth every 4 (four) hours as needed for severe pain.    [provider]  tolterodine (DETROL) 2 MG tablet Take 2 mg by mouth daily.     [provider]    Allergies Patient has no known allergies.  Family Hx Family History  Problem Relation Age of Onset  . Breast cancer Maternal Aunt   . Hypertension Mother   . Hypertension Father   . Diabetes Father     Social Hx Social History   Tobacco Use  . Smoking status: Never Smoker  . Smokeless tobacco: Never Used  Substance Use Topics  . Alcohol use: No  . Drug use: No     Review of Systems  Constitutional: Negative for fever, chills. Eyes: Negative for visual changes. ENT: Negative for sore throat. Cardiovascular: Negative for chest pain. Respiratory: Negative for shortness of breath. Gastrointestinal: + reflux symptoms Genitourinary: Negative for dysuria. Musculoskeletal: Negative for leg swelling. Skin: Negative for rash. Neurological: Negative for for headaches.   Physical Exam  Vital Signs: ED Triage Vitals  Enc Vitals Group     BP 04/11/19 1546 (!) 180/80     Pulse Rate 04/11/19 1546 91     Resp 04/11/19 1546 18     Temp 04/11/19 1546 97.7 F (36.5 C)     Temp Source 04/11/19 1546 Oral     SpO2 04/11/19 1546 98 %     Weight 04/11/19 1545 144 lb (65.3 kg)     Height 04/11/19 1545 5\' 7"  (1.702 m)     Head Circumference --      Peak Flow --      Pain Score 04/11/19 1545 10     Pain Loc --      Pain Edu? --      Excl. in [name]? --     Constitutional: Alert and oriented.  Head: Normocephalic. Atraumatic. Eyes: Conjunctivae clear. Sclera anicteric. Nose: No congestion. No rhinorrhea. Mouth/Throat: Mucous membranes are moist.  Neck: No stridor.   Cardiovascular: Normal rate, regular rhythm. No murmurs. Extremities well perfused. Respiratory: Normal respiratory effort.  Lungs CTAB. Gastrointestinal: Soft. Non-tender. Non-distended.  Musculoskeletal: No lower extremity edema. No  deformities. Neurologic:  Normal speech and language. No gross focal neurologic deficits are appreciated.  Skin: Skin is warm, dry and intact. No rash noted. Psychiatric: Mood and affect are appropriate for situation.  EKG  Personally reviewed.   Rate: 88 Rhythm: sinus Axis: normal Intervals: QRS wide 2/2 borderline RBBB Borderline RBBB TWI III Appears largely unchanged from prior No STEMI    Radiology  XR: IMPRESSION:  No active cardiopulmonary disease.    Procedures  Procedure(s) performed (including critical care):  Procedures   Initial Impression / Assessment and Plan / ED Course  63 y.o. female who presents to the ED for burning epigastric abdominal pain, reflux symptoms up into her chest.  As above.  Ddx: GERD, reflux, pancreatitis, atypical ACS  Plan: labs, EKG, urine, medications and reassess  EKG without acute ischemic changes.  High-sensitivity troponin negative.  Creatinine slightly improved from baseline.  HFP unremarkable.  Mildly elevated lipase, perhaps element of pancreatitis contributing, will give IVF and advised liquid diet with slow advancmenet.  Repeat high-sensitivity troponin negative.  Presentation seems most consistent with reflux, mild pancreatitis.  Cardiac work-up negative.  Will advise course of PPI, supportive care with regards to pancreatitis, and PCP follow-up.  Final Clinical Impression(s) / ED Diagnosis  Final diagnoses:  Symptoms of gastroesophageal reflux  Epigastric pain  Burning chest pain       Note:  This document was prepared using Dragon voice recognition software and may include unintentional dictation errors.   Lilia Pro., MD 04/12/19 [phone]

## 2019-05-04 ENCOUNTER — Other Ambulatory Visit: Payer: Self-pay | Admitting: Obstetrics and Gynecology

## 2019-05-04 DIAGNOSIS — Z1231 Encounter for screening mammogram for malignant neoplasm of breast: Secondary | ICD-10-CM

## 2019-10-28 ENCOUNTER — Ambulatory Visit
Admission: RE | Admit: 2019-10-28 | Discharge: 2019-10-28 | Disposition: A | Discharge status: Home / Self Care | Payer: BC Managed Care – PPO | Source: Ambulatory Visit | Attending: Obstetrics and Gynecology | Admitting: Obstetrics and Gynecology

## 2019-10-28 DIAGNOSIS — Z1231 Encounter for screening mammogram for malignant neoplasm of breast: Secondary | ICD-10-CM

## 2019-12-29 ENCOUNTER — Other Ambulatory Visit: Payer: Self-pay

## 2019-12-29 ENCOUNTER — Encounter: Payer: Self-pay | Admitting: Emergency Medicine

## 2019-12-29 ENCOUNTER — Emergency Department
Admission: EM | Admit: 2019-12-29 | Discharge: 2019-12-29 | Disposition: A | Discharge status: Home / Self Care | Payer: BC Managed Care – PPO | Attending: Student | Admitting: Student

## 2019-12-29 DIAGNOSIS — E039 Hypothyroidism, unspecified: Secondary | ICD-10-CM | POA: Diagnosis not present

## 2019-12-29 DIAGNOSIS — H0011 Chalazion right upper eyelid: Secondary | ICD-10-CM | POA: Diagnosis present

## 2019-12-29 DIAGNOSIS — Z7984 Long term (current) use of oral hypoglycemic drugs: Secondary | ICD-10-CM | POA: Diagnosis not present

## 2019-12-29 DIAGNOSIS — E119 Type 2 diabetes mellitus without complications: Secondary | ICD-10-CM | POA: Insufficient documentation

## 2019-12-29 MED ORDER — GENTAMICIN SULFATE 0.3 % OP OINT: TOPICAL_OINTMENT | Freq: Three times a day (TID) | OPHTHALMIC | 0 refills | Status: DC

## 2019-12-29 NOTE — ED Notes (Signed)
Pt c/o swelling, redness of the right eye lid with drainage for the past couple of week, states she was seen by her PCP and finished oral abx last week.

## 2019-12-29 NOTE — Discharge Instructions (Signed)
Call [facility] and set up an appointment as soon as possible to be seen.  You may apply the Garamycin ophthalmic ointment to the area.  Use warm moist compresses to the area frequently.  Do not apply to the eye if the cough that you are using is too warm as it could cause a burn to the eyelid.  You may take Tylenol with this medication as needed for pain.

## 2019-12-29 NOTE — ED Provider Notes (Signed)
[facility] Emergency Department Provider Note  ____________________________________________   First MD Initiated Contact with Patient 12/29/19 0825     (approximate)  I have reviewed the triage vital signs and the nursing notes.   HISTORY  Chief Complaint Eye Problem   HPI [name] is a 64 y.o. female presents to the ED with complaint of swelling, redness to her right upper eyelid with some minimal drainage for the last couple of weeks.  Patient states that her PCP placed her on an oral antibiotic last week.  She states that she does not see much improvement.  Patient states that she has been told in the past that she had a stye.  She denies any visual changes other than the swelling of her eyelid has slightly obstructed her view.  She denies any fever or chills.         Past Medical History:  Diagnosis Date   Arthritis    Blood dyscrasia    patient stated she is a "free bleeder"   Diabetes mellitus without complication ([place])    Diverticulosis    Hypothyroidism    Neuropathy    Renal insufficiency     Patient Active Problem List   Diagnosis Date Noted   Post-operative state 03/16/2017   Acute renal failure (ARF) ([place]) 01/18/2017   Acute respiratory failure with hypoxia ([place]) 11/04/2015    Past Surgical History:  Procedure Laterality Date   ABDOMINAL HYSTERECTOMY     BILATERAL SALPINGECTOMY Bilateral 03/16/2017   Procedure: BILATERAL SALPINGECTOMY;  Surgeon: Boykin Nearing, MD;  Location: [facility] ORS;  Service: Gynecology;  Laterality: Bilateral;   COLONOSCOPY WITH PROPOFOL N/A 08/18/2018   Procedure: COLONOSCOPY WITH PROPOFOL;  Surgeon: Manya Silvas, MD;  Location: [facility] ENDOSCOPY;  Service: Endoscopy;  Laterality: N/A;   CYSTOCELE REPAIR N/A 03/16/2017   Procedure: ANTERIOR REPAIR (CYSTOCELE);  Surgeon: Schermerhorn, Gwen Her, MD;  Location: [facility] ORS;  Service: Gynecology;  Laterality: N/A;    ESOPHAGOGASTRODUODENOSCOPY (EGD) WITH PROPOFOL N/A 08/18/2018   Procedure: ESOPHAGOGASTRODUODENOSCOPY (EGD) WITH PROPOFOL;  Surgeon: Manya Silvas, MD;  Location: [facility] ENDOSCOPY;  Service: Endoscopy;  Laterality: N/A;   TEAR DUCT PROBING Right    TUBAL LIGATION     VAGINAL HYSTERECTOMY N/A 03/16/2017   Procedure: HYSTERECTOMY VAGINAL;  Surgeon: Schermerhorn, Gwen Her, MD;  Location: [facility] ORS;  Service: Gynecology;  Laterality: N/A;   WISDOM TOOTH EXTRACTION      Prior to Admission medications   Medication Sig Start Date End Date Taking? Authorizing Provider  gabapentin (NEURONTIN) 100 MG capsule Take 200 mg by mouth 2 (two) times daily.  08/16/15   [provider]  gentamicin (GARAMYCIN) 0.3 % ophthalmic ointment Place into the right eye 3 (three) times daily. 12/29/19   Johnn Hai, PA-C  glipiZIDE (GLUCOTROL) 5 MG tablet Take 5 mg by mouth 2 (two) times daily before a meal.  06/02/16   [provider]  ibuprofen (ADVIL,MOTRIN) 200 MG tablet Take 200 mg by mouth every 6 (six) hours as needed for moderate pain.    [provider]  levothyroxine (SYNTHROID, LEVOTHROID) 75 MCG tablet Take 75 mcg by mouth daily.  10/12/17   [provider]  metFORMIN (GLUCOPHAGE-XR) 500 MG 24 hr tablet Take 1,000 mg by mouth daily.  08/31/15   [provider]  naproxen sodium (ALEVE) 220 MG tablet Take 220 mg by mouth 2 (two) times daily as needed (foot pain).    [provider]  omeprazole (PRILOSEC) 40  MG capsule Take 40 mg by mouth daily. Take 30 min after Synthroid and 30 min before breakfast    [provider]  omeprazole (PRILOSEC) 40 MG capsule Take 1 capsule (40 mg total) by mouth daily. 04/11/19 05/11/19  Lilia Pro., MD  oxyCODONE-acetaminophen (PERCOCET/ROXICET) 5-325 MG tablet Take 1 tablet by mouth every 4 (four) hours as needed for severe pain.    [provider]  tolterodine (DETROL) 2 MG tablet Take 2 mg by mouth daily.      [provider]    Allergies Patient has no known allergies.  Family History  Problem Relation Age of Onset   Breast cancer Maternal Aunt    Hypertension Mother    Hypertension Father    Diabetes Father     Social History Social History   Tobacco Use   Smoking status: Never Smoker   Smokeless tobacco: Never Used  Scientific laboratory technician Use: Never used  Substance Use Topics   Alcohol use: No   Drug use: No    Review of Systems Constitutional: No fever/chills Eyes: No visual changes.  Right upper eyelid pain. ENT: No sore throat. Cardiovascular: Denies chest pain. Gastrointestinal:  No nausea, no vomiting.  Musculoskeletal: Negative for muscle aches. Skin: Negative for rash. Neurological: Negative for headaches, focal weakness or numbness. ____________________________________________   PHYSICAL EXAM:  VITAL SIGNS: ED Triage Vitals  Enc Vitals Group     BP 12/29/19 0809 (!) 148/49     Pulse Rate 12/29/19 0809 88     Resp 12/29/19 0809 16     Temp 12/29/19 0809 98.9 F (37.2 C)     Temp Source 12/29/19 0809 Oral     SpO2 12/29/19 0809 97 %     Weight 12/29/19 0804 143 lb 15.4 oz (65.3 kg)     Height 12/29/19 0804 5\' 7"  (1.702 m)     Head Circumference --      Peak Flow --      Pain Score 12/29/19 0803 4     Pain Loc --      Pain Edu? --      Excl. in [place]? --     Constitutional: Alert and oriented. Well appearing and in no acute distress. Eyes: Conjunctivae are normal. PERRL. EOMI. right upper lid lateral aspect there is a chronic-looking area with minimal erythema.  No drainage is noted at this time.  Nontender to palpation.  Area on exam is more consistent with a chalazion than a stye at this time. Head: Atraumatic. Nose: No congestion/rhinnorhea. Neck: No stridor.   Cardiovascular: Normal rate, regular rhythm. Grossly normal heart sounds.  Good peripheral circulation. Respiratory: Normal respiratory effort.  No retractions. Lungs  CTAB. Musculoskeletal: Moves upper and lower extremities any difficulty normal gait was noted. Neurologic:  Normal speech and language. No gross focal neurologic deficits are appreciated. No gait instability. Skin:  Skin is warm, dry and intact.  Psychiatric: Mood and affect are normal. Speech and behavior are normal.  ____________________________________________   LABS (all labs ordered are listed, but only abnormal results are displayed)  Labs Reviewed - No data to display  PROCEDURES  Procedure(s) performed (including Critical Care):  Procedures   ____________________________________________   INITIAL IMPRESSION / ASSESSMENT AND PLAN / ED COURSE  As part of my medical decision making, I reviewed the following data within the electronic MEDICAL RECORD NUMBER Notes from prior ED visits and [place] Controlled Substance Database  64 year old female presents to the ED with complaint  of right upper eyelid swelling with some noted drainage over the last several weeks.  Patient was placed on oral antibiotics by her PCP which did not help.  Patient has in the past been told she has a stye.  On exam today appearance appears to be more of a Chalazion.  Patient was given some gentamicin ophthalmic ointment but told more importantly to use warm compresses to her eye frequently.  Most likely she will need to see [facility] for incision of this area.  Patient was given the name of the ophthalmologist on call today.  She is aware she will need to call and make an appointment. ____________________________________________   FINAL CLINICAL IMPRESSION(S) / ED DIAGNOSES  Final diagnoses:  Chalazion of right upper eyelid     ED Discharge Orders         Ordered    gentamicin (GARAMYCIN) 0.3 % ophthalmic ointment  3 times daily     Discontinue  Reprint     12/29/19 1000           Note:  This document was prepared using Dragon voice recognition software and may include unintentional dictation  errors.    Naydeline, [place], PA-C 12/29/19 1135    Lilia Pro., MD 12/30/19 2012

## 2019-12-29 NOTE — ED Triage Notes (Signed)
C/O redness and pain to right eyelid x 2-3 weeks  Seen by PCP and given oral antibiotic, but not improving.

## 2020-04-08 ENCOUNTER — Other Ambulatory Visit: Payer: Self-pay

## 2020-04-08 ENCOUNTER — Emergency Department
Admission: EM | Admit: 2020-04-08 | Discharge: 2020-04-08 | Disposition: A | Discharge status: Home / Self Care | Payer: PRIVATE HEALTH INSURANCE | Attending: Emergency Medicine | Admitting: Emergency Medicine

## 2020-04-08 ENCOUNTER — Emergency Department: Payer: PRIVATE HEALTH INSURANCE

## 2020-04-08 ENCOUNTER — Encounter: Payer: Self-pay | Admitting: Emergency Medicine

## 2020-04-08 DIAGNOSIS — E039 Hypothyroidism, unspecified: Secondary | ICD-10-CM | POA: Diagnosis not present

## 2020-04-08 DIAGNOSIS — E1140 Type 2 diabetes mellitus with diabetic neuropathy, unspecified: Secondary | ICD-10-CM | POA: Insufficient documentation

## 2020-04-08 DIAGNOSIS — W010XXA Fall on same level from slipping, tripping and stumbling without subsequent striking against object, initial encounter: Secondary | ICD-10-CM | POA: Insufficient documentation

## 2020-04-08 DIAGNOSIS — R519 Headache, unspecified: Secondary | ICD-10-CM | POA: Diagnosis not present

## 2020-04-08 DIAGNOSIS — Z7984 Long term (current) use of oral hypoglycemic drugs: Secondary | ICD-10-CM | POA: Insufficient documentation

## 2020-04-08 DIAGNOSIS — S0083XA Contusion of other part of head, initial encounter: Secondary | ICD-10-CM | POA: Diagnosis not present

## 2020-04-08 DIAGNOSIS — Z79899 Other long term (current) drug therapy: Secondary | ICD-10-CM | POA: Diagnosis not present

## 2020-04-08 DIAGNOSIS — S0993XA Unspecified injury of face, initial encounter: Secondary | ICD-10-CM | POA: Diagnosis present

## 2020-04-08 DIAGNOSIS — W19XXXA Unspecified fall, initial encounter: Secondary | ICD-10-CM

## 2020-04-08 DIAGNOSIS — Y9289 Other specified places as the place of occurrence of the external cause: Secondary | ICD-10-CM | POA: Diagnosis not present

## 2020-04-08 MED ORDER — HYDROCODONE-ACETAMINOPHEN 5-325 MG PO TABS
1.0000 | ORAL_TABLET | Freq: Once | ORAL | Status: AC
  Administered 2020-04-08: 1 via ORAL
  Filled 2020-04-08: qty 1

## 2020-04-08 NOTE — ED Notes (Signed)
Husband has been called for a ride [phone], he is on the way

## 2020-04-08 NOTE — ED Triage Notes (Signed)
This is worker comp, pt works for [employer].  Tripped and fell on carpet and face hit floor.  Swelling not nose with scant blood.  No LOC. No blood thinners.  VSS.

## 2020-04-08 NOTE — ED Provider Notes (Signed)
[facility] Emergency Department Provider Note ____________________________________________  Time seen: 1016  I have reviewed the triage vital signs and the nursing notes.  HISTORY  Chief Complaint  Fall  HPI [name] is a 64 y.o. female with the below medical history, presents to the ED via EMS from her workplace.  Patient was involved in a mechanical fall prior to arrival.  She describes tripping over some folded up rugs that she was cleaned in her area.  Patient describes falling face first, and had resultant nosebleed.  She denies any loss of consciousness, nausea, vomiting, dizziness.  The patient also denies any preceding chest pain, shortness of breath, or weakness.  She admits to taking her home meds, and eating a biscuit this morning.  Patient is a type II diabetic on long-term insulin therapy.  She has been awake and active since the incident, and complains primarily of pain to the nose and face.  She denies any other injury at this time.  Past Medical History:  Diagnosis Date  . Arthritis   . Blood dyscrasia    patient stated she is a "free bleeder"  . Diabetes mellitus without complication ([place])   . Diverticulosis   . Hypothyroidism   . Neuropathy   . Renal insufficiency     Patient Active Problem List   Diagnosis Date Noted  . Post-operative state 03/16/2017  . Acute renal failure (ARF) ([place]) 01/18/2017  . Acute respiratory failure with hypoxia ([place]) 11/04/2015    Past Surgical History:  Procedure Laterality Date  . ABDOMINAL HYSTERECTOMY    . BILATERAL SALPINGECTOMY Bilateral 03/16/2017   Procedure: BILATERAL SALPINGECTOMY;  Surgeon: Schermerhorn, Gwen Her, MD;  Location: [facility] ORS;  Service: Gynecology;  Laterality: Bilateral;  . COLONOSCOPY WITH PROPOFOL N/A 08/18/2018   Procedure: COLONOSCOPY WITH PROPOFOL;  Surgeon: Manya Silvas, MD;  Location: [facility] ENDOSCOPY;  Service: Endoscopy;  Laterality: N/A;  . CYSTOCELE REPAIR N/A  03/16/2017   Procedure: ANTERIOR REPAIR (CYSTOCELE);  Surgeon: Schermerhorn, Gwen Her, MD;  Location: [facility] ORS;  Service: Gynecology;  Laterality: N/A;  . ESOPHAGOGASTRODUODENOSCOPY (EGD) WITH PROPOFOL N/A 08/18/2018   Procedure: ESOPHAGOGASTRODUODENOSCOPY (EGD) WITH PROPOFOL;  Surgeon: Manya Silvas, MD;  Location: [facility] ENDOSCOPY;  Service: Endoscopy;  Laterality: N/A;  . TEAR DUCT PROBING Right   . TUBAL LIGATION    . VAGINAL HYSTERECTOMY N/A 03/16/2017   Procedure: HYSTERECTOMY VAGINAL;  Surgeon: Schermerhorn, Gwen Her, MD;  Location: [facility] ORS;  Service: Gynecology;  Laterality: N/A;  . WISDOM TOOTH EXTRACTION      Prior to Admission medications   Medication Sig Start Date End Date Taking? Authorizing Provider  gabapentin (NEURONTIN) 100 MG capsule Take 200 mg by mouth 2 (two) times daily.  08/16/15   [provider]  gentamicin (GARAMYCIN) 0.3 % ophthalmic ointment Place into the right eye 3 (three) times daily. 12/29/19   Johnn Hai, PA-C  glipiZIDE (GLUCOTROL) 5 MG tablet Take 5 mg by mouth 2 (two) times daily before a meal.  06/02/16   [provider]  ibuprofen (ADVIL,MOTRIN) 200 MG tablet Take 200 mg by mouth every 6 (six) hours as needed for moderate pain.    [provider]  levothyroxine (SYNTHROID, LEVOTHROID) 75 MCG tablet Take 75 mcg by mouth daily.  10/12/17   [provider]  metFORMIN (GLUCOPHAGE-XR) 500 MG 24 hr tablet Take 1,000 mg by mouth daily.  08/31/15   [provider]  naproxen sodium (ALEVE) 220 MG tablet Take 220 mg by mouth  2 (two) times daily as needed (foot pain).    [provider]  omeprazole (PRILOSEC) 40 MG capsule Take 40 mg by mouth daily. Take 30 min after Synthroid and 30 min before breakfast    [provider]  omeprazole (PRILOSEC) 40 MG capsule Take 1 capsule (40 mg total) by mouth daily. 04/11/19 05/11/19  Lilia Pro., MD  oxyCODONE-acetaminophen (PERCOCET/ROXICET) 5-325 MG tablet Take  1 tablet by mouth every 4 (four) hours as needed for severe pain.    [provider]  tolterodine (DETROL) 2 MG tablet Take 2 mg by mouth daily.     [provider]    Allergies Patient has no known allergies.  Family History  Problem Relation Age of Onset  . Breast cancer Maternal Aunt   . Hypertension Mother   . Hypertension Father   . Diabetes Father     Social History Social History   Tobacco Use  . Smoking status: Never Smoker  . Smokeless tobacco: Never Used  Vaping Use  . Vaping Use: Never used  Substance Use Topics  . Alcohol use: No  . Drug use: No    Review of Systems  Constitutional: Negative for fever. Eyes: Negative for visual changes. ENT: Negative for sore throat.  Reports nosebleed as above. Cardiovascular: Negative for chest pain. Respiratory: Negative for shortness of breath. Gastrointestinal: Negative for abdominal pain, vomiting and diarrhea. Genitourinary: Negative for dysuria. Musculoskeletal: Negative for back pain. Skin: Negative for rash. Neurological: Negative for headaches, focal weakness or numbness. ____________________________________________  PHYSICAL EXAM:  VITAL SIGNS: ED Triage Vitals [04/08/20 0809]  Enc Vitals Group     BP (!) 159/68     Pulse Rate 86     Resp 16     Temp 97.9 F (36.6 C)     Temp Source Oral     SpO2 100 %     Weight 144 lb (65.3 kg)     Height 5\' 7"  (1.702 m)     Head Circumference      Peak Flow      Pain Score 7     Pain Loc      Pain Edu?      Excl. in [place]?     Constitutional: Alert and oriented. Well appearing and in no distress.  GCS = 15  Head: Normocephalic and atraumatic, except for some soft tissue swelling to the central forehead.. Eyes: Conjunctivae are normal. PERRL. Normal extraocular movements Ears: Canals clear. TMs intact bilaterally. Nose: No congestion/rhinorrhea.  No active epistaxis. Nasal abrasion noted. Dried blood in the bilateral anterior nares.  No nasal  bone deformity or dislocation appreciated.  No septal hematoma noted. Mouth/Throat: Mucous membranes are moist.  Poor dentition globally.  Arrested dental caries.  No acute oral lesions noted. Neck: Supple. No midline tenderness, spasm, deformity, or step-off. Cardiovascular: Normal rate, regular rhythm. Normal distal pulses. Respiratory: Normal respiratory effort. No wheezes/rales/rhonchi. Gastrointestinal: Soft and nontender. No distention. Musculoskeletal: Nontender with normal range of motion in all extremities.  Neurologic:  Normal gait without ataxia. Normal speech and language. No gross focal neurologic deficits are appreciated. Skin:  Skin is warm, dry and intact. No rash noted. Psychiatric: Mood and affect are normal. Patient exhibits appropriate insight and judgment. _____________________________________________   RADIOLOGY  CT Head / Maxillofacial w/o CM  IMPRESSION: 1. Mild diffuse cortical atrophy. Mild chronic ischemic white matter disease. Small right frontal scalp hematoma. No acute intracranial abnormality seen. 2. No abnormality seen in maxillofacial region. ____________________________________________  PROCEDURES  Norco 5-325 mg PO  Procedures ____________________________________________  INITIAL IMPRESSION / ASSESSMENT AND PLAN / ED COURSE  Patient with ED evaluation of injury sustained following a mechanical fall at work.  Her exam is overall benign reassuring at this time.  No acute fracture or intracranial process is suspected or revealed with CT imaging of the head and face.  Patient has a facial contusion with self-limited epistaxis but no other acute injuries at this time.  She is discharged to follow-up with her companies Worker's Comp. provider or return to the ED for acutely worsening symptoms.  She has advised that she will take extra strength Tylenol as needed for pain relief.  Return precautions have again been reviewed.  Records provided for 1 day as  requested.  Sloane Palmer was evaluated in Emergency Department on 04/08/2020 for the symptoms described in the history of present illness. She was evaluated in the context of the global COVID-19 pandemic, which necessitated consideration that the patient might be at risk for infection with the SARS-CoV-2 virus that causes COVID-19. Institutional protocols and algorithms that pertain to the evaluation of patients at risk for COVID-19 are in a state of rapid change based on information released by regulatory bodies including the CDC and federal and state organizations. These policies and algorithms were followed during the patient's care in the ED. ____________________________________________  FINAL CLINICAL IMPRESSION(S) / ED DIAGNOSES  Final diagnoses:  Contusion of face, initial encounter  Fall, initial encounter      Melvenia Needles, PA-C 04/08/20 1256    Vladimir Crofts, MD 04/08/20 1306

## 2020-04-08 NOTE — ED Notes (Signed)
Pt to the room in wheelchair, pain is 8/10 from the neck up.  Pt fell at work onto your face.  Nose has been bleeding but is controlled at this time, swelling of the nose is noted.

## 2020-04-08 NOTE — Discharge Instructions (Addendum)
Your exam, and CT scans of the head and face are negative for any acute fractures to the face or nose. You may experience a few days of stiffness related to the fall. Take over-the-counter Tylenol as directed. Follow-up with your primary provider or the Worker's Comp. provider for your company.

## 2020-04-12 ENCOUNTER — Emergency Department: Payer: PRIVATE HEALTH INSURANCE

## 2020-04-12 ENCOUNTER — Encounter: Payer: Self-pay | Admitting: Emergency Medicine

## 2020-04-12 ENCOUNTER — Other Ambulatory Visit: Payer: Self-pay

## 2020-04-12 ENCOUNTER — Emergency Department
Admission: EM | Admit: 2020-04-12 | Discharge: 2020-04-12 | Disposition: A | Discharge status: Home / Self Care | Payer: PRIVATE HEALTH INSURANCE | Attending: Emergency Medicine | Admitting: Emergency Medicine

## 2020-04-12 DIAGNOSIS — W010XXA Fall on same level from slipping, tripping and stumbling without subsequent striking against object, initial encounter: Secondary | ICD-10-CM | POA: Diagnosis not present

## 2020-04-12 DIAGNOSIS — S299XXA Unspecified injury of thorax, initial encounter: Secondary | ICD-10-CM | POA: Diagnosis present

## 2020-04-12 DIAGNOSIS — W19XXXA Unspecified fall, initial encounter: Secondary | ICD-10-CM

## 2020-04-12 DIAGNOSIS — S2232XA Fracture of one rib, left side, initial encounter for closed fracture: Secondary | ICD-10-CM | POA: Insufficient documentation

## 2020-04-12 DIAGNOSIS — Y998 Other external cause status: Secondary | ICD-10-CM | POA: Insufficient documentation

## 2020-04-12 DIAGNOSIS — Y9389 Activity, other specified: Secondary | ICD-10-CM | POA: Diagnosis not present

## 2020-04-12 DIAGNOSIS — Y92512 Supermarket, store or market as the place of occurrence of the external cause: Secondary | ICD-10-CM | POA: Insufficient documentation

## 2020-04-12 MED ORDER — HYDROCODONE-ACETAMINOPHEN 5-325 MG PO TABS: 1.0000 | ORAL_TABLET | Freq: Four times a day (QID) | ORAL | 0 refills | Status: DC | PRN

## 2020-04-12 NOTE — ED Provider Notes (Signed)
[facility] Emergency Department Provider Note   ____________________________________________   First MD Initiated Contact with Patient 04/12/20 [phone]     (approximate)  I have reviewed the triage vital signs and the nursing notes.   HISTORY  Chief Complaint Rib Injury    HPI [name] is a 64 y.o. female presents today with complaint of right rib pain.  Patient states she was seen on 04/08/2020 after she fell when she tripped on a mat at [place].  Initially she was seen for injuries to her face and at the time she had no complaints of rib pain.  This morning she woke with severe rib pain to the right side.  She has continued to take deep breaths and has no difficulty breathing other than it hurts when she takes a deep breath.  Patient denies being a smoker.  She rates her pain as a 8/10 at this time.      Past Medical History:  Diagnosis Date  . Arthritis   . Blood dyscrasia    patient stated she is a "free bleeder"  . Diabetes mellitus without complication ([place])   . Diverticulosis   . Hypothyroidism   . Neuropathy   . Renal insufficiency     Patient Active Problem List   Diagnosis Date Noted  . Post-operative state 03/16/2017  . Acute renal failure (ARF) ([place]) 01/18/2017  . Acute respiratory failure with hypoxia ([place]) 11/04/2015    Past Surgical History:  Procedure Laterality Date  . ABDOMINAL HYSTERECTOMY    . BILATERAL SALPINGECTOMY Bilateral 03/16/2017   Procedure: BILATERAL SALPINGECTOMY;  Surgeon: Schermerhorn, Gwen Her, MD;  Location: [facility] ORS;  Service: Gynecology;  Laterality: Bilateral;  . COLONOSCOPY WITH PROPOFOL N/A 08/18/2018   Procedure: COLONOSCOPY WITH PROPOFOL;  Surgeon: Manya Silvas, MD;  Location: [facility] ENDOSCOPY;  Service: Endoscopy;  Laterality: N/A;  . CYSTOCELE REPAIR N/A 03/16/2017   Procedure: ANTERIOR REPAIR (CYSTOCELE);  Surgeon: Schermerhorn, Gwen Her, MD;  Location: [facility] ORS;  Service: Gynecology;   Laterality: N/A;  . ESOPHAGOGASTRODUODENOSCOPY (EGD) WITH PROPOFOL N/A 08/18/2018   Procedure: ESOPHAGOGASTRODUODENOSCOPY (EGD) WITH PROPOFOL;  Surgeon: Manya Silvas, MD;  Location: [facility] ENDOSCOPY;  Service: Endoscopy;  Laterality: N/A;  . TEAR DUCT PROBING Right   . TUBAL LIGATION    . VAGINAL HYSTERECTOMY N/A 03/16/2017   Procedure: HYSTERECTOMY VAGINAL;  Surgeon: Schermerhorn, Gwen Her, MD;  Location: [facility] ORS;  Service: Gynecology;  Laterality: N/A;  . WISDOM TOOTH EXTRACTION      Prior to Admission medications   Medication Sig Start Date End Date Taking? Authorizing Provider  gabapentin (NEURONTIN) 100 MG capsule Take 200 mg by mouth 2 (two) times daily.  08/16/15   [provider]  gentamicin (GARAMYCIN) 0.3 % ophthalmic ointment Place into the right eye 3 (three) times daily. 12/29/19   Johnn Hai, PA-C  glipiZIDE (GLUCOTROL) 5 MG tablet Take 5 mg by mouth 2 (two) times daily before a meal.  06/02/16   [provider]  HYDROcodone-acetaminophen (NORCO/VICODIN) 5-325 MG tablet Take 1 tablet by mouth every 6 (six) hours as needed for moderate pain. 04/12/20   Johnn Hai, PA-C  ibuprofen (ADVIL,MOTRIN) 200 MG tablet Take 200 mg by mouth every 6 (six) hours as needed for moderate pain.    [provider]  levothyroxine (SYNTHROID, LEVOTHROID) 75 MCG tablet Take 75 mcg by mouth daily.  10/12/17   [provider]  metFORMIN (GLUCOPHAGE-XR) 500 MG 24 hr tablet Take 1,000 mg by mouth  daily.  08/31/15   [provider]  naproxen sodium (ALEVE) 220 MG tablet Take 220 mg by mouth 2 (two) times daily as needed (foot pain).    [provider]  omeprazole (PRILOSEC) 40 MG capsule Take 40 mg by mouth daily. Take 30 min after Synthroid and 30 min before breakfast    [provider]  omeprazole (PRILOSEC) 40 MG capsule Take 1 capsule (40 mg total) by mouth daily. 04/11/19 05/11/19  Lilia Pro., MD  oxyCODONE-acetaminophen  (PERCOCET/ROXICET) 5-325 MG tablet Take 1 tablet by mouth every 4 (four) hours as needed for severe pain.    [provider]  tolterodine (DETROL) 2 MG tablet Take 2 mg by mouth daily.     [provider]    Allergies Patient has no known allergies.  Family History  Problem Relation Age of Onset  . Breast cancer Maternal Aunt   . Hypertension Mother   . Hypertension Father   . Diabetes Father     Social History Social History   Tobacco Use  . Smoking status: Never Smoker  . Smokeless tobacco: Never Used  Vaping Use  . Vaping Use: Never used  Substance Use Topics  . Alcohol use: No  . Drug use: No    Review of Systems Constitutional: No fever/chills Eyes: No visual changes. ENT: Facial bruising and edema from fall.  Noted on previous ED visit of 04/08/2020 Cardiovascular: Denies chest pain. Respiratory: Denies shortness of breath. Gastrointestinal: No abdominal pain.  No nausea, no vomiting.  Musculoskeletal: Positive right rib pain. Skin: Negative for rash. Neurological: Negative for headaches, focal weakness or numbness.  ____________________________________________   PHYSICAL EXAM:  VITAL SIGNS: ED Triage Vitals  Enc Vitals Group     BP 04/12/20 0619 118/66     Pulse Rate 04/12/20 0619 86     Resp 04/12/20 0619 16     Temp 04/12/20 0619 98.2 F (36.8 C)     Temp Source 04/12/20 0619 Oral     SpO2 04/12/20 0619 96 %     Weight 04/12/20 0619 144 lb (65.3 kg)     Height 04/12/20 0619 5\' 6"  (1.676 m)     Head Circumference --      Peak Flow --      Pain Score 04/12/20 0631 8     Pain Loc --      Pain Edu? --      Excl. in [place]? --     Constitutional: Alert and oriented. Well appearing and in no acute distress. Eyes: Conjunctivae are normal. PERRL. EOMI. Head: Atraumatic. Neck: No stridor.   Cardiovascular: Normal rate, regular rhythm. Grossly normal heart sounds.  Good peripheral circulation. Respiratory: Normal respiratory effort.  No  retractions. Lungs CTAB.  On examination there is no gross deformity noted on the left ribs however there is marked tenderness on palpation on the right eighth, ninth and 10th rib area.  No ecchymosis or abrasions are seen. Gastrointestinal: Soft and nontender. No distention. Musculoskeletal: Rib pain as noted above.  Patient is able to move upper and lower extremities without any difficulty. Neurologic:  Normal speech and language. No gross focal neurologic deficits are appreciated. No gait instability. Skin:  Skin is warm, dry and intact.  No ecchymosis or abrasions are seen. Psychiatric: Mood and affect are normal. Speech and behavior are normal.  ____________________________________________   LABS (all labs ordered are listed, but only abnormal results are displayed)  Labs Reviewed - No data to display  RADIOLOGY I, Johnn Hai, personally viewed and evaluated these images (plain radiographs) as part of my medical decision making, as well as reviewing the written report by the radiologist.  Official radiology report(s): DG Ribs Unilateral W/Chest Right  Result Date: 04/12/2020 CLINICAL DATA:  Injury.  Right lower chest pain. EXAM: RIGHT RIBS AND CHEST - 3+ VIEW COMPARISON:  Chest x-ray 04/11/2019. FINDINGS: Mediastinum hilar structures normal. Lungs are clear. No pleural effusion or pneumothorax. Heart size normal. Nondisplaced right posterolateral tenth rib fracture cannot be excluded. Degenerative change thoracolumbar spine and both shoulders. IMPRESSION: 1. Nondisplaced right posterolateral tenth rib fracture cannot be excluded. 2. No acute cardiopulmonary disease. No pneumothorax. Electronically Signed   By: Marcello Moores  Register   On: 04/12/2020 07:11    ____________________________________________   PROCEDURES  Procedure(s) performed (including Critical Care):  Procedures ____________________________________________   INITIAL IMPRESSION / ASSESSMENT AND PLAN / ED  COURSE  As part of my medical decision making, I reviewed the following data within the electronic MEDICAL RECORD NUMBER Notes from prior ED visits and  Controlled Substance Database  64 year old female presents to the ED with complaint of right rib pain.  Patient was seen on 04/08/2020 after a Workmen's Comp. injury which she tripped over a mat and fell.  On her initial visit to the emergency department her facial contusions were evaluated.  At that time she did not experience any pain to her rib cage area.  Today she presents with pain on the right side and x-ray shows a nondisplaced fracture of the right 10th rib.  Patient was made aware.  A prescription for hydrocodone-acetaminophen was sent to her pharmacy.  Patient is aware she cannot drive or operate machinery while taking this medication.  She was also given a note for work stating that she does have a rib fracture with limitations.  She is encouraged to follow-up with her companies doctor, urgent care or if company allows because of Workmen's Comp. follow-up with her PCP.  ____________________________________________   FINAL CLINICAL IMPRESSION(S) / ED DIAGNOSES  Final diagnoses:  Fracture of one rib, left side, initial encounter for closed fracture  Fall, initial encounter     ED Discharge Orders         Ordered    HYDROcodone-acetaminophen (NORCO/VICODIN) 5-325 MG tablet  Every 6 hours PRN        04/12/20 0850          *Please note:  Lucienne Sawyers was evaluated in Emergency Department on 04/12/2020 for the symptoms described in the history of present illness. She was evaluated in the context of the global COVID-19 pandemic, which necessitated consideration that the patient might be at risk for infection with the SARS-CoV-2 virus that causes COVID-19. Institutional protocols and algorithms that pertain to the evaluation of patients at risk for COVID-19 are in a state of rapid change based on information released by regulatory bodies  including the CDC and federal and state organizations. These policies and algorithms were followed during the patient's care in the ED.  Some ED evaluations and interventions may be delayed as a result of limited staffing during and the pandemic.*   Note:  This document was prepared using Dragon voice recognition software and may include unintentional dictation errors.    Bill, Mcvey, PA-C 04/12/20 1436    Nena Polio, MD 05/05/20 [phone]

## 2020-04-12 NOTE — Discharge Instructions (Signed)
Follow up with your primary care doctor or the workman's comp. Doctor if they have one for any continued problems. Take hydrocodone every 6 hours as needed for pain and you may take ibuprofen with this if additional pain medication is needed.  Take deep breaths frequently to avoid pneumonia.  You may apply ice to your ribs as needed.

## 2020-04-12 NOTE — ED Triage Notes (Addendum)
Pt to triage via w/c with no distress noted; pt seen 10/3 for fall at [facility]; pt c/o rt lateral and lower ribcage pain, tender to palpate; pain increases with movement and deep breathing; pt reports her ribs were not examined at that time

## 2020-04-12 NOTE — ED Notes (Signed)
Pt verbalizes understanding of d/c instructions, medications and follow up 

## 2020-05-09 ENCOUNTER — Other Ambulatory Visit: Payer: Self-pay | Admitting: Obstetrics and Gynecology

## 2020-05-09 DIAGNOSIS — Z1231 Encounter for screening mammogram for malignant neoplasm of breast: Secondary | ICD-10-CM

## 2020-10-31 ENCOUNTER — Ambulatory Visit
Admission: RE | Admit: 2020-10-31 | Discharge: 2020-10-31 | Disposition: A | Discharge status: Home / Self Care | Payer: BC Managed Care – PPO | Source: Ambulatory Visit | Attending: Obstetrics and Gynecology | Admitting: Obstetrics and Gynecology

## 2020-10-31 ENCOUNTER — Other Ambulatory Visit: Payer: Self-pay

## 2020-10-31 DIAGNOSIS — Z1231 Encounter for screening mammogram for malignant neoplasm of breast: Secondary | ICD-10-CM | POA: Diagnosis present

## 2021-01-12 ENCOUNTER — Other Ambulatory Visit: Payer: Self-pay

## 2021-01-12 ENCOUNTER — Encounter: Payer: Self-pay | Admitting: Emergency Medicine

## 2021-01-12 DIAGNOSIS — E1140 Type 2 diabetes mellitus with diabetic neuropathy, unspecified: Secondary | ICD-10-CM | POA: Diagnosis not present

## 2021-01-12 DIAGNOSIS — R0602 Shortness of breath: Secondary | ICD-10-CM | POA: Diagnosis not present

## 2021-01-12 DIAGNOSIS — Z79899 Other long term (current) drug therapy: Secondary | ICD-10-CM | POA: Diagnosis not present

## 2021-01-12 DIAGNOSIS — R4182 Altered mental status, unspecified: Secondary | ICD-10-CM | POA: Diagnosis not present

## 2021-01-12 DIAGNOSIS — R112 Nausea with vomiting, unspecified: Secondary | ICD-10-CM | POA: Diagnosis present

## 2021-01-12 DIAGNOSIS — E039 Hypothyroidism, unspecified: Secondary | ICD-10-CM | POA: Insufficient documentation

## 2021-01-12 DIAGNOSIS — Z7984 Long term (current) use of oral hypoglycemic drugs: Secondary | ICD-10-CM | POA: Insufficient documentation

## 2021-01-12 DIAGNOSIS — R5383 Other fatigue: Secondary | ICD-10-CM | POA: Insufficient documentation

## 2021-01-12 DIAGNOSIS — Z20822 Contact with and (suspected) exposure to covid-19: Secondary | ICD-10-CM | POA: Diagnosis not present

## 2021-01-12 DIAGNOSIS — K5790 Diverticulosis of intestine, part unspecified, without perforation or abscess without bleeding: Secondary | ICD-10-CM | POA: Diagnosis not present

## 2021-01-12 LAB — CBC
HCT: 38.7 % (ref 36.0–46.0)
Hemoglobin: 12.7 g/dL (ref 12.0–15.0)
MCH: 28.8 pg (ref 26.0–34.0)
MCHC: 32.8 g/dL (ref 30.0–36.0)
MCV: 87.8 fL (ref 80.0–100.0)
Platelets: 174 10*3/uL (ref 150–400)
RBC: 4.41 MIL/uL (ref 3.87–5.11)
RDW: 13.7 % (ref 11.5–15.5)
WBC: 9.3 10*3/uL (ref 4.0–10.5)
nRBC: 0 % (ref 0.0–0.2)

## 2021-01-12 LAB — COMPREHENSIVE METABOLIC PANEL
ALT: 21 U/L (ref 0–44)
AST: 30 U/L (ref 15–41)
Albumin: 4 g/dL (ref 3.5–5.0)
Alkaline Phosphatase: 85 U/L (ref 38–126)
Anion gap: 8 (ref 5–15)
BUN: 32 mg/dL — ABNORMAL HIGH (ref 8–23)
CO2: 22 mmol/L (ref 22–32)
Calcium: 8.9 mg/dL (ref 8.9–10.3)
Chloride: 108 mmol/L (ref 98–111)
Creatinine, Ser: 1.38 mg/dL — ABNORMAL HIGH (ref 0.44–1.00)
GFR, Estimated: 43 mL/min — ABNORMAL LOW (ref >60)
Glucose, Bld: 158 mg/dL — ABNORMAL HIGH (ref 70–99)
Potassium: 4.8 mmol/L (ref 3.5–5.1)
Sodium: 138 mmol/L (ref 135–145)
Total Bilirubin: 1 mg/dL (ref 0.3–1.2)
Total Protein: 7.8 g/dL (ref 6.5–8.1)

## 2021-01-12 LAB — LIPASE, BLOOD: Lipase: 42 U/L (ref 11–51)

## 2021-01-12 NOTE — ED Triage Notes (Signed)
Pt to ED via EMS with c/o emesis and weakness since 1600. Pt states hx of similar episode but never this bad. Pt states she started to eat earlier but "couldn't get it down".

## 2021-01-12 NOTE — ED Triage Notes (Signed)
Pt to ED via [facility] with c/o N/V and lethargy. Per EMS pt given 4mg  Zofran and 200cc's NS, 18g to L AC. Per EMS pt with noted ST on the monitor with noted RBB. Per EMS pt A&O x4, only c/o nausea.

## 2021-01-13 ENCOUNTER — Emergency Department: Payer: BC Managed Care – PPO

## 2021-01-13 ENCOUNTER — Emergency Department
Admission: EM | Admit: 2021-01-13 | Discharge: 2021-01-13 | Disposition: A | Discharge status: Home / Self Care | Payer: BC Managed Care – PPO | Attending: Emergency Medicine | Admitting: Emergency Medicine

## 2021-01-13 DIAGNOSIS — E860 Dehydration: Secondary | ICD-10-CM

## 2021-01-13 DIAGNOSIS — N390 Urinary tract infection, site not specified: Secondary | ICD-10-CM

## 2021-01-13 DIAGNOSIS — R112 Nausea with vomiting, unspecified: Secondary | ICD-10-CM

## 2021-01-13 LAB — URINALYSIS, COMPLETE (UACMP) WITH MICROSCOPIC
Bilirubin Urine: NEGATIVE
Glucose, UA: >=500 mg/dL — AB
Ketones, ur: 5 mg/dL — AB
Nitrite: NEGATIVE
Protein, ur: NEGATIVE mg/dL
Specific Gravity, Urine: 1.02 (ref 1.005–1.030)
WBC, UA: >50 WBC/hpf — ABNORMAL HIGH (ref 0–5)
pH: 5 (ref 5.0–8.0)

## 2021-01-13 LAB — BRAIN NATRIURETIC PEPTIDE: B Natriuretic Peptide: 44.8 pg/mL (ref 0.0–100.0)

## 2021-01-13 LAB — RESP PANEL BY RT-PCR (FLU A&B, COVID) ARPGX2
Influenza A by PCR: NEGATIVE
Influenza B by PCR: NEGATIVE
SARS Coronavirus 2 by RT PCR: NEGATIVE

## 2021-01-13 LAB — TROPONIN I (HIGH SENSITIVITY)
Troponin I (High Sensitivity): 7 ng/L (ref <18)
Troponin I (High Sensitivity): 6 ng/L (ref <18)

## 2021-01-13 LAB — PROCALCITONIN: Procalcitonin: 0.27 ng/mL

## 2021-01-13 MED ORDER — ONDANSETRON 4 MG PO TBDP: 4.0000 mg | ORAL_TABLET | Freq: Three times a day (TID) | ORAL | 0 refills | Status: DC | PRN

## 2021-01-13 MED ORDER — METOCLOPRAMIDE HCL 5 MG/ML IJ SOLN
10.0000 mg | Freq: Once | INTRAMUSCULAR | Status: AC
  Administered 2021-01-13: 10 mg via INTRAVENOUS
  Filled 2021-01-13: qty 2

## 2021-01-13 MED ORDER — SODIUM CHLORIDE 0.9 % IV BOLUS
1000.0000 mL | Freq: Once | INTRAVENOUS | Status: AC
  Administered 2021-01-13: 1000 mL via INTRAVENOUS

## 2021-01-13 MED ORDER — SODIUM CHLORIDE 0.9 % IV SOLN
12.5000 mg | Freq: Four times a day (QID) | INTRAVENOUS | Status: DC | PRN
  Administered 2021-01-13: 12.5 mg via INTRAVENOUS
  Filled 2021-01-13 (×2): qty 0.5

## 2021-01-13 MED ORDER — IOHEXOL 300 MG/ML  SOLN
60.0000 mL | Freq: Once | INTRAMUSCULAR | Status: AC | PRN
  Administered 2021-01-13: 60 mL via INTRAVENOUS

## 2021-01-13 MED ORDER — CEFDINIR 300 MG PO CAPS: 300.0000 mg | ORAL_CAPSULE | Freq: Two times a day (BID) | ORAL | 0 refills | Status: AC

## 2021-01-13 NOTE — ED Provider Notes (Signed)
[facility] Emergency Department Provider Note  ____________________________________________   Event Date/Time   First MD Initiated Contact with Patient 01/13/21 0059     (approximate)  I have reviewed the triage vital signs and the nursing notes.   HISTORY  Chief Complaint Weakness and Emesis    HPI [name] is a 65 y.o. female with hypothyroidism, diabetes who comes in with nausea, vomiting, lethargy.  Patient was given 4 mg Zofran and 200 cc of normal saline with EMS.  Patient reports that this started around 4 PM.  Patient states that it just started today, continues to feel nauseous.  She states no one else is sick at home.  She states that she is not sure if it developed from anything she ate.  Denies any chest pain, shortness of breath just feels generalized weakness and nausea.          Past Medical History:  Diagnosis Date   Arthritis    Blood dyscrasia    patient stated she is a "free bleeder"   Diabetes mellitus without complication ([place])    Diverticulosis    Hypothyroidism    Neuropathy    Renal insufficiency     Patient Active Problem List   Diagnosis Date Noted   Post-operative state 03/16/2017   Acute renal failure (ARF) ([place]) 01/18/2017   Acute respiratory failure with hypoxia ([place]) 11/04/2015    Past Surgical History:  Procedure Laterality Date   ABDOMINAL HYSTERECTOMY     BILATERAL SALPINGECTOMY Bilateral 03/16/2017   Procedure: BILATERAL SALPINGECTOMY;  Surgeon: Boykin Nearing, MD;  Location: [facility] ORS;  Service: Gynecology;  Laterality: Bilateral;   COLONOSCOPY WITH PROPOFOL N/A 08/18/2018   Procedure: COLONOSCOPY WITH PROPOFOL;  Surgeon: Manya Silvas, MD;  Location: [facility] ENDOSCOPY;  Service: Endoscopy;  Laterality: N/A;   CYSTOCELE REPAIR N/A 03/16/2017   Procedure: ANTERIOR REPAIR (CYSTOCELE);  Surgeon: Schermerhorn, Gwen Her, MD;  Location: [facility] ORS;  Service: Gynecology;  Laterality: N/A;    ESOPHAGOGASTRODUODENOSCOPY (EGD) WITH PROPOFOL N/A 08/18/2018   Procedure: ESOPHAGOGASTRODUODENOSCOPY (EGD) WITH PROPOFOL;  Surgeon: Manya Silvas, MD;  Location: [facility] ENDOSCOPY;  Service: Endoscopy;  Laterality: N/A;   TEAR DUCT PROBING Right    TUBAL LIGATION     VAGINAL HYSTERECTOMY N/A 03/16/2017   Procedure: HYSTERECTOMY VAGINAL;  Surgeon: Schermerhorn, Gwen Her, MD;  Location: [facility] ORS;  Service: Gynecology;  Laterality: N/A;   WISDOM TOOTH EXTRACTION      Prior to Admission medications   Medication Sig Start Date End Date Taking? Authorizing Provider  gabapentin (NEURONTIN) 100 MG capsule Take 200 mg by mouth 2 (two) times daily.  08/16/15   [provider]  gentamicin (GARAMYCIN) 0.3 % ophthalmic ointment Place into the right eye 3 (three) times daily. 12/29/19   Johnn Hai, PA-C  glipiZIDE (GLUCOTROL) 5 MG tablet Take 5 mg by mouth 2 (two) times daily before a meal.  06/02/16   [provider]  HYDROcodone-acetaminophen (NORCO/VICODIN) 5-325 MG tablet Take 1 tablet by mouth every 6 (six) hours as needed for moderate pain. 04/12/20   Johnn Hai, PA-C  ibuprofen (ADVIL,MOTRIN) 200 MG tablet Take 200 mg by mouth every 6 (six) hours as needed for moderate pain.    [provider]  levothyroxine (SYNTHROID, LEVOTHROID) 75 MCG tablet Take 75 mcg by mouth daily.  10/12/17   [provider]  metFORMIN (GLUCOPHAGE-XR) 500 MG 24 hr tablet Take 1,000 mg by mouth daily.  08/31/15   [provider]  naproxen sodium (ALEVE) 220 MG tablet Take 220 mg by mouth 2 (two) times daily as needed (foot pain).    [provider]  omeprazole (PRILOSEC) 40 MG capsule Take 40 mg by mouth daily. Take 30 min after Synthroid and 30 min before breakfast    [provider]  omeprazole (PRILOSEC) 40 MG capsule Take 1 capsule (40 mg total) by mouth daily. 04/11/19 05/11/19  Lilia Pro., MD  oxyCODONE-acetaminophen (PERCOCET/ROXICET) 5-325 MG  tablet Take 1 tablet by mouth every 4 (four) hours as needed for severe pain.    [provider]  tolterodine (DETROL) 2 MG tablet Take 2 mg by mouth daily.     [provider]    Allergies Patient has no known allergies.  Family History  Problem Relation Age of Onset   Breast cancer Maternal Aunt    Hypertension Mother    Hypertension Father    Diabetes Father     Social History Social History   Tobacco Use   Smoking status: Never   Smokeless tobacco: Never  Vaping Use   Vaping Use: Never used  Substance Use Topics   Alcohol use: No   Drug use: No      Review of Systems Constitutional: No fever/chills, positive weakness Eyes: No visual changes. ENT: No sore throat. Cardiovascular: Denies chest pain. Respiratory: Denies shortness of breath. Gastrointestinal: No abdominal pain.  Positive nausea, + vomiting.  No diarrhea.  No constipation. Genitourinary: Negative for dysuria. Musculoskeletal: Negative for back pain. Skin: Negative for rash. Neurological: Negative for headaches, focal weakness or numbness. All other ROS negative ____________________________________________   PHYSICAL EXAM:  VITAL SIGNS: ED Triage Vitals  Enc Vitals Group     BP 01/12/21 2244 120/66     Pulse Rate 01/12/21 2244 99     Resp 01/12/21 2244 20     Temp 01/12/21 2244 98 F (36.7 C)     Temp Source 01/12/21 2244 Oral     SpO2 01/12/21 2244 96 %     Weight 01/12/21 2241 143 lb 15.4 oz (65.3 kg)     Height 01/12/21 2241 5\' 6"  (1.676 m)     Head Circumference --      Peak Flow --      Pain Score 01/12/21 2241 0     Pain Loc --      Pain Edu? --      Excl. in [place]? --     Constitutional: Alert and oriented. Well appearing and in no acute distress. Eyes: Conjunctivae are normal. EOMI. Head: Atraumatic. Nose: No congestion/rhinnorhea. Mouth/Throat: Mucous membranes are moist.  Poor dentition  Neck: No stridor. Trachea Midline. FROM Cardiovascular: Normal rate,  regular rhythm. Grossly normal heart sounds.  Good peripheral circulation. Respiratory: Normal respiratory effort.  No retractions. Lungs CTAB. Gastrointestinal: Soft and nontender. No distention. No abdominal bruits.  Musculoskeletal: No lower extremity tenderness nor edema.  No joint effusions. Neurologic:  Normal speech and language. No gross focal neurologic deficits are appreciated.  Equal strength in arms and legs.  Cranial nerves appear intact.   Skin:  Skin is warm, dry and intact. No rash noted. Psychiatric: Mood and affect are normal. Speech and behavior are normal. GU: Deferred   ____________________________________________   LABS (all labs ordered are listed, but only abnormal results are displayed)  Labs Reviewed  COMPREHENSIVE METABOLIC PANEL - Abnormal; Notable for the following components:      Result Value   Glucose, Bld 158 (*)    BUN  32 (*)    Creatinine, Ser 1.38 (*)    GFR, Estimated 43 (*)    All other components within normal limits  LIPASE, BLOOD  CBC  URINALYSIS, COMPLETE (UACMP) WITH MICROSCOPIC   ____________________________________________   ED ECG REPORT I, Vanessa [place], the attending physician, personally viewed and interpreted this ECG.  Normal sinus rhythm 97, no ST elevation, no T wave inversions, right bundle branch block ____________________________________________  RADIOLOGY I, Vanessa [place], personally viewed and evaluated these images (plain radiographs) as part of my medical decision making, as well as reviewing the written report by the radiologist.  ED MD interpretation: mild interstitial markings   Official radiology report(s): DG Chest Portable 1 View  Result Date: 01/13/2021 CLINICAL DATA:  Shortness of breath, nausea, vomiting, lethargy. EXAM: PORTABLE CHEST 1 VIEW COMPARISON:  Chest x-ray dated 04/12/2020. FINDINGS: Heart size and mediastinal contours are within normal limits. Mildly prominent interstitial markings bilaterally.  No pleural effusion or pneumothorax is seen. Osseous structures about the chest are unremarkable. IMPRESSION: Mildly prominent interstitial markings bilaterally, suspicious for mild edema versus atypical/viral pneumonia. Electronically Signed   By: Franki Cabot M.D.   On: 01/13/2021 05:30    ____________________________________________   PROCEDURES  Procedure(s) performed (including Critical Care):  .1-3 Lead EKG Interpretation  Date/Time: 01/13/2021 7:15 AM Performed by: Vanessa [place], MD Authorized by: Vanessa [place], MD     Interpretation: normal     ECG rate:  90s   ECG rate assessment: normal     Rhythm: sinus rhythm     Ectopy: none     Conduction: normal   Comments:     Occasional sinus tachy    ____________________________________________   INITIAL IMPRESSION / ASSESSMENT AND PLAN / ED COURSE  [name] was evaluated in Emergency Department on 01/13/2021 for the symptoms described in the history of present illness. She was evaluated in the context of the global COVID-19 pandemic, which necessitated consideration that the patient might be at risk for infection with the SARS-CoV-2 virus that causes COVID-19. Institutional protocols and algorithms that pertain to the evaluation of patients at risk for COVID-19 are in a state of rapid change based on information released by regulatory bodies including the CDC and federal and state organizations. These policies and algorithms were followed during the patient's care in the ED.    Patient is a 65 year old with normal vital signs who comes in with weakness and nausea .  Labs ordered to evaluate for Electra abnormalities, AKI, ACS.  Denies any falls and neuro intact so unlikely intracranial process.  Kidney function slightly elevated at 1.38  Patient denies any nausea after the Phenergan.  But she states that she just feels really tired and weak.  Patient still getting fluids.  Patient has had a little bit of low saturations  within that is in the setting of her sleeping.  We will get a chest x-ray to make shows no evidence of pneumonia.  Pt with more vomiting. Xray possible infiltrates- will get procalc/bnp to evaluate further. Never complained of SOB oxygen now 95%.    Ct ordered given pt vomiting again and acting tired. Will makes sure no ICH/abd process.   Handed off to oncoming team.        ____________________________________________   FINAL CLINICAL IMPRESSION(S) / ED DIAGNOSES   Final diagnoses:  Nausea and vomiting, intractability of vomiting not specified, unspecified vomiting type      MEDICATIONS GIVEN DURING THIS VISIT:  Medications  promethazine (  PHENERGAN) 12.5 mg in sodium chloride 0.9 % 50 mL IVPB (0 mg Intravenous Stopped 01/13/21 0355)  metoCLOPramide (REGLAN) injection 10 mg (has no administration in time range)  sodium chloride 0.9 % bolus 1,000 mL (0 mLs Intravenous Stopped 01/13/21 0656)     ED Discharge Orders     None        Note:  This document was prepared using Dragon voice recognition software and may include unintentional dictation errors.    Vanessa [place], MD 01/13/21 [phone]

## 2021-01-13 NOTE — ED Provider Notes (Signed)
Emergency department handoff note  This patient was signed out to me at the end of the previous provider shift.  All pertinent patient information was conveyed and all questions were answered.  Patient was pending a CT of the head/abdomen/pelvis.  The studies did not show any evidence of acute abnormalities.  Laboratory evaluation does show evidence of urinary tract infection and will treat empirically with cefdinir as well as obtain a urine culture.  Patient continues to be hemodynamically stable and afebrile.  Patient counseled on these results and all questions were answered to the best my ability.  The patient has been reexamined and is ready to be discharged.  All diagnostic results have been reviewed and discussed with the patient/family.  Care plan has been outlined and the patient/family understands all current diagnoses, results, and treatment plans.  There are no new complaints, changes, or physical findings at this time.  All questions have been addressed and answered.  All medications, if any, that were given while in the emergency department or any that are being prescribed have been reviewed with the patient/family.  All side effects and adverse reactions have been explained.  Patient was instructed to, and agrees to follow-up with their primary care physician as well as return to the emergency department if any new or worsening symptoms develop.   Naaman Plummer, MD 01/13/21 [phone]

## 2021-01-13 NOTE — ED Notes (Signed)
Pt sleeping on L side, easily arousable for assessment. Pt comfortable, updated on POC. Pt aware of need for urine sample.

## 2021-01-14 LAB — URINE CULTURE: Culture: <10000 — AB

## 2021-05-23 ENCOUNTER — Other Ambulatory Visit: Payer: Self-pay | Admitting: Nurse Practitioner

## 2021-05-23 DIAGNOSIS — K5790 Diverticulosis of intestine, part unspecified, without perforation or abscess without bleeding: Secondary | ICD-10-CM

## 2021-05-23 DIAGNOSIS — R1084 Generalized abdominal pain: Secondary | ICD-10-CM

## 2021-05-23 DIAGNOSIS — R194 Change in bowel habit: Secondary | ICD-10-CM

## 2021-08-11 ENCOUNTER — Inpatient Hospital Stay
Admission: EM | Admit: 2021-08-11 | Discharge: 2021-08-16 | DRG: 602 | Disposition: A | Discharge status: Home / Self Care | Payer: BC Managed Care – PPO | Attending: Internal Medicine | Admitting: Internal Medicine

## 2021-08-11 ENCOUNTER — Inpatient Hospital Stay: Payer: BC Managed Care – PPO

## 2021-08-11 ENCOUNTER — Encounter: Payer: Self-pay | Admitting: Emergency Medicine

## 2021-08-11 ENCOUNTER — Emergency Department
Admission: EM | Admit: 2021-08-11 | Discharge: 2021-08-11 | Disposition: A | Discharge status: Home / Self Care | Payer: BC Managed Care – PPO | Source: Home / Self Care | Attending: Emergency Medicine | Admitting: Emergency Medicine

## 2021-08-11 ENCOUNTER — Other Ambulatory Visit: Payer: Self-pay

## 2021-08-11 ENCOUNTER — Emergency Department: Payer: BC Managed Care – PPO

## 2021-08-11 DIAGNOSIS — U071 COVID-19: Secondary | ICD-10-CM | POA: Insufficient documentation

## 2021-08-11 DIAGNOSIS — E041 Nontoxic single thyroid nodule: Secondary | ICD-10-CM | POA: Diagnosis present

## 2021-08-11 DIAGNOSIS — L0390 Cellulitis, unspecified: Secondary | ICD-10-CM | POA: Diagnosis present

## 2021-08-11 DIAGNOSIS — Z79899 Other long term (current) drug therapy: Secondary | ICD-10-CM

## 2021-08-11 DIAGNOSIS — L03211 Cellulitis of face: Secondary | ICD-10-CM | POA: Insufficient documentation

## 2021-08-11 DIAGNOSIS — K219 Gastro-esophageal reflux disease without esophagitis: Secondary | ICD-10-CM | POA: Diagnosis present

## 2021-08-11 DIAGNOSIS — Z79890 Hormone replacement therapy (postmenopausal): Secondary | ICD-10-CM | POA: Diagnosis not present

## 2021-08-11 DIAGNOSIS — N1831 Chronic kidney disease, stage 3a: Secondary | ICD-10-CM | POA: Diagnosis present

## 2021-08-11 DIAGNOSIS — K029 Dental caries, unspecified: Secondary | ICD-10-CM | POA: Diagnosis present

## 2021-08-11 DIAGNOSIS — E039 Hypothyroidism, unspecified: Secondary | ICD-10-CM | POA: Diagnosis present

## 2021-08-11 DIAGNOSIS — R0602 Shortness of breath: Secondary | ICD-10-CM

## 2021-08-11 DIAGNOSIS — Z8249 Family history of ischemic heart disease and other diseases of the circulatory system: Secondary | ICD-10-CM

## 2021-08-11 DIAGNOSIS — N179 Acute kidney failure, unspecified: Secondary | ICD-10-CM | POA: Diagnosis present

## 2021-08-11 DIAGNOSIS — E119 Type 2 diabetes mellitus without complications: Secondary | ICD-10-CM

## 2021-08-11 DIAGNOSIS — K047 Periapical abscess without sinus: Secondary | ICD-10-CM

## 2021-08-11 DIAGNOSIS — T887XXA Unspecified adverse effect of drug or medicament, initial encounter: Secondary | ICD-10-CM

## 2021-08-11 DIAGNOSIS — E1122 Type 2 diabetes mellitus with diabetic chronic kidney disease: Secondary | ICD-10-CM | POA: Diagnosis present

## 2021-08-11 DIAGNOSIS — Z7984 Long term (current) use of oral hypoglycemic drugs: Secondary | ICD-10-CM | POA: Diagnosis not present

## 2021-08-11 DIAGNOSIS — Z833 Family history of diabetes mellitus: Secondary | ICD-10-CM | POA: Diagnosis not present

## 2021-08-11 DIAGNOSIS — K5900 Constipation, unspecified: Secondary | ICD-10-CM | POA: Diagnosis present

## 2021-08-11 LAB — GROUP A STREP BY PCR: Group A Strep by PCR: NOT DETECTED

## 2021-08-11 LAB — CBC WITH DIFFERENTIAL/PLATELET
Abs Immature Granulocytes: 0.02 10*3/uL (ref 0.00–0.07)
Basophils Absolute: 0 10*3/uL (ref 0.0–0.1)
Basophils Relative: 0 %
Eosinophils Absolute: 0.1 10*3/uL (ref 0.0–0.5)
Eosinophils Relative: 1 %
HCT: 39.1 % (ref 36.0–46.0)
Hemoglobin: 12.9 g/dL (ref 12.0–15.0)
Immature Granulocytes: 0 %
Lymphocytes Relative: 23 %
Lymphs Abs: 2 10*3/uL (ref 0.7–4.0)
MCH: 27.8 pg (ref 26.0–34.0)
MCHC: 33 g/dL (ref 30.0–36.0)
MCV: 84.3 fL (ref 80.0–100.0)
Monocytes Absolute: 0.6 10*3/uL (ref 0.1–1.0)
Monocytes Relative: 7 %
Neutro Abs: 5.8 10*3/uL (ref 1.7–7.7)
Neutrophils Relative %: 69 %
Platelets: 181 10*3/uL (ref 150–400)
RBC: 4.64 MIL/uL (ref 3.87–5.11)
RDW: 13.2 % (ref 11.5–15.5)
WBC: 8.5 10*3/uL (ref 4.0–10.5)
nRBC: 0 % (ref 0.0–0.2)

## 2021-08-11 LAB — GLUCOSE, CAPILLARY: Glucose-Capillary: 175 mg/dL — ABNORMAL HIGH (ref 70–99)

## 2021-08-11 LAB — CBC
HCT: 36.7 % (ref 36.0–46.0)
Hemoglobin: 12.2 g/dL (ref 12.0–15.0)
MCH: 28.4 pg (ref 26.0–34.0)
MCHC: 33.2 g/dL (ref 30.0–36.0)
MCV: 85.3 fL (ref 80.0–100.0)
Platelets: 179 10*3/uL (ref 150–400)
RBC: 4.3 MIL/uL (ref 3.87–5.11)
RDW: 13.2 % (ref 11.5–15.5)
WBC: 7.6 10*3/uL (ref 4.0–10.5)
nRBC: 0 % (ref 0.0–0.2)

## 2021-08-11 LAB — BASIC METABOLIC PANEL
Anion gap: 8 (ref 5–15)
BUN: 21 mg/dL (ref 8–23)
CO2: 24 mmol/L (ref 22–32)
Calcium: 9.3 mg/dL (ref 8.9–10.3)
Chloride: 105 mmol/L (ref 98–111)
Creatinine, Ser: 1.06 mg/dL — ABNORMAL HIGH (ref 0.44–1.00)
GFR, Estimated: 59 mL/min — ABNORMAL LOW (ref >60)
Glucose, Bld: 189 mg/dL — ABNORMAL HIGH (ref 70–99)
Potassium: 4.4 mmol/L (ref 3.5–5.1)
Sodium: 137 mmol/L (ref 135–145)

## 2021-08-11 LAB — RESP PANEL BY RT-PCR (FLU A&B, COVID) ARPGX2
Influenza A by PCR: NEGATIVE
Influenza B by PCR: NEGATIVE
SARS Coronavirus 2 by RT PCR: POSITIVE — AB

## 2021-08-11 LAB — COMPREHENSIVE METABOLIC PANEL
ALT: 20 U/L (ref 0–44)
AST: 24 U/L (ref 15–41)
Albumin: 3.8 g/dL (ref 3.5–5.0)
Alkaline Phosphatase: 112 U/L (ref 38–126)
Anion gap: 6 (ref 5–15)
BUN: 23 mg/dL (ref 8–23)
CO2: 23 mmol/L (ref 22–32)
Calcium: 9 mg/dL (ref 8.9–10.3)
Chloride: 108 mmol/L (ref 98–111)
Creatinine, Ser: 1.21 mg/dL — ABNORMAL HIGH (ref 0.44–1.00)
GFR, Estimated: 50 mL/min — ABNORMAL LOW (ref >60)
Glucose, Bld: 171 mg/dL — ABNORMAL HIGH (ref 70–99)
Potassium: 4.6 mmol/L (ref 3.5–5.1)
Sodium: 137 mmol/L (ref 135–145)
Total Bilirubin: 0.6 mg/dL (ref 0.3–1.2)
Total Protein: 7.7 g/dL (ref 6.5–8.1)

## 2021-08-11 MED ORDER — HYDROCODONE-ACETAMINOPHEN 5-325 MG PO TABS
1.0000 | ORAL_TABLET | ORAL | Status: DC | PRN
  Administered 2021-08-11: 1 via ORAL
  Filled 2021-08-11: qty 1

## 2021-08-11 MED ORDER — KETOROLAC TROMETHAMINE 30 MG/ML IJ SOLN
30.0000 mg | Freq: Four times a day (QID) | INTRAMUSCULAR | Status: DC | PRN
  Filled 2021-08-11: qty 1

## 2021-08-11 MED ORDER — ENOXAPARIN SODIUM 40 MG/0.4ML IJ SOSY
40.0000 mg | PREFILLED_SYRINGE | INTRAMUSCULAR | Status: DC
  Administered 2021-08-11 – 2021-08-12 (×2): 40 mg via SUBCUTANEOUS
  Filled 2021-08-11 (×5): qty 0.4

## 2021-08-11 MED ORDER — MORPHINE SULFATE (PF) 4 MG/ML IV SOLN
4.0000 mg | Freq: Once | INTRAVENOUS | Status: AC
  Administered 2021-08-11: 4 mg via INTRAVENOUS
  Filled 2021-08-11: qty 1

## 2021-08-11 MED ORDER — ACETAMINOPHEN 650 MG RE SUPP: 650.0000 mg | Freq: Four times a day (QID) | RECTAL | Status: DC | PRN

## 2021-08-11 MED ORDER — ONDANSETRON HCL 4 MG/2ML IJ SOLN
4.0000 mg | Freq: Once | INTRAMUSCULAR | Status: AC
  Administered 2021-08-11: 4 mg via INTRAVENOUS
  Filled 2021-08-11: qty 2

## 2021-08-11 MED ORDER — GUAIFENESIN-DM 100-10 MG/5ML PO SYRP
10.0000 mL | ORAL_SOLUTION | ORAL | Status: DC | PRN
  Administered 2021-08-14: 10 mL via ORAL
  Filled 2021-08-11: qty 10

## 2021-08-11 MED ORDER — ACETAMINOPHEN 325 MG PO TABS
650.0000 mg | ORAL_TABLET | Freq: Four times a day (QID) | ORAL | Status: DC | PRN
  Administered 2021-08-12: 650 mg via ORAL
  Filled 2021-08-11: qty 2

## 2021-08-11 MED ORDER — CLINDAMYCIN PHOSPHATE 600 MG/50ML IV SOLN
600.0000 mg | Freq: Once | INTRAVENOUS | Status: AC
  Administered 2021-08-11: 600 mg via INTRAVENOUS
  Filled 2021-08-11: qty 50

## 2021-08-11 MED ORDER — ORAL CARE MOUTH RINSE
15.0000 mL | Freq: Two times a day (BID) | OROMUCOSAL | Status: DC
  Administered 2021-08-12 – 2021-08-16 (×10): 15 mL via OROMUCOSAL

## 2021-08-11 MED ORDER — INSULIN ASPART 100 UNIT/ML IJ SOLN: 0.0000 [IU] | Freq: Every day | INTRAMUSCULAR | Status: DC

## 2021-08-11 MED ORDER — HYDROCOD POLI-CHLORPHE POLI ER 10-8 MG/5ML PO SUER
5.0000 mL | Freq: Two times a day (BID) | ORAL | Status: DC | PRN
  Administered 2021-08-12: 5 mL via ORAL
  Filled 2021-08-11: qty 5

## 2021-08-11 MED ORDER — IOHEXOL 300 MG/ML  SOLN
75.0000 mL | Freq: Once | INTRAMUSCULAR | Status: AC | PRN
  Administered 2021-08-11: 75 mL via INTRAVENOUS
  Filled 2021-08-11: qty 75

## 2021-08-11 MED ORDER — SODIUM CHLORIDE 0.9 % IV SOLN
100.0000 mg | Freq: Every day | INTRAVENOUS | Status: DC
  Administered 2021-08-12: 100 mg via INTRAVENOUS
  Filled 2021-08-11: qty 20

## 2021-08-11 MED ORDER — INSULIN ASPART 100 UNIT/ML IJ SOLN
0.0000 [IU] | Freq: Three times a day (TID) | INTRAMUSCULAR | Status: DC
  Administered 2021-08-12: 3 [IU] via SUBCUTANEOUS
  Administered 2021-08-12: 2 [IU] via SUBCUTANEOUS
  Filled 2021-08-11 (×2): qty 1

## 2021-08-11 MED ORDER — SODIUM CHLORIDE 0.9 % IV SOLN
200.0000 mg | Freq: Once | INTRAVENOUS | Status: AC
  Administered 2021-08-12: 200 mg via INTRAVENOUS
  Filled 2021-08-11: qty 200

## 2021-08-11 MED ORDER — CLINDAMYCIN PHOSPHATE 600 MG/50ML IV SOLN
600.0000 mg | Freq: Three times a day (TID) | INTRAVENOUS | Status: DC
  Administered 2021-08-12 – 2021-08-16 (×13): 600 mg via INTRAVENOUS
  Filled 2021-08-11 (×15): qty 50

## 2021-08-11 MED ORDER — AMOXICILLIN-POT CLAVULANATE 875-125 MG PO TABS: 1.0000 | ORAL_TABLET | Freq: Two times a day (BID) | ORAL | 0 refills | Status: DC

## 2021-08-11 NOTE — ED Provider Notes (Signed)
[facility] Provider Note    Event Date/Time   First MD Initiated Contact with Patient 08/11/21 2041     (approximate)   History   Chief Complaint Shortness of Breath   HPI  [name] is a 66 y.o. female with past medical tree of diabetes, hypothyroidism, and CKD who presents to the ED complaining of shortness of breath and facial pain.  Patient reports that she developed swelling along her right chin after waking up this morning, notes the area has been hot to touch and very tender.  She was evaluated in the ED earlier today, at which time CT scan was concerning for facial cellulitis associated with dental infection.  No findings concerning for abscess at that time and patient was discharged home with prescription for Augmentin.  She states that she took her first dose around 3 PM and started feeling worse afterwards.  She reports feeling itchiness across her chest but did not noticed any rash.  She states she developed a headache with some difficulty breathing, denies any nausea, vomiting, or diarrhea.  She states that she has taken penicillins before without any problem, is not sure she has taken Augmentin specifically.  She reports longstanding issues with poor teeth and has significant pain over her right lower jaw.      Physical Exam   Triage Vital Signs: ED Triage Vitals  Enc Vitals Group     BP 08/11/21 2026 (!) 167/83     Pulse Rate 08/11/21 2026 (!) 109     Resp 08/11/21 2026 (!) 22     Temp 08/11/21 2026 99.5 F (37.5 C)     Temp Source 08/11/21 2026 Oral     SpO2 08/11/21 2026 95 %     Weight --      Height --      Head Circumference --      Peak Flow --      Pain Score 08/11/21 2027 9     Pain Loc --      Pain Edu? --      Excl. in [place]? --     Most recent vital signs: Vitals:   08/11/21 2026 08/11/21 2100  BP: (!) 167/83 (!) 184/84  Pulse: (!) 109 96  Resp: (!) 22 13  Temp: 99.5 F (37.5 C)   SpO2: 95% 98%     Constitutional: Alert and oriented. Eyes: Conjunctivae are normal. Head: Atraumatic. Nose: No congestion/rhinnorhea. Mouth/Throat: Mucous membranes are moist.  Very poor dentition with multiple broken teeth, edema and erythema noted over right lower gum with no focal fluctuance. Neck: Edema, erythema, warmth, and tenderness noted over right anterior chin tracking down the right side of her neck.  Submandibular compartments are soft. Cardiovascular: Normal rate, regular rhythm. Grossly normal heart sounds.  2+ radial pulses bilaterally. Respiratory: Normal respiratory effort.  No retractions. Lungs CTAB. Gastrointestinal: Soft and nontender. No distention. Musculoskeletal: No lower extremity tenderness nor edema.  Neurologic:  Normal speech and language. No gross focal neurologic deficits are appreciated.    ED Results / Procedures / Treatments   Labs (all labs ordered are listed, but only abnormal results are displayed) Labs Reviewed  BASIC METABOLIC PANEL - Abnormal; Notable for the following components:      Result Value   Glucose, Bld 189 (*)    Creatinine, Ser 1.06 (*)    GFR, Estimated 59 (*)    All other components within normal limits  RESP PANEL BY RT-PCR (FLU A&B, COVID)  ARPGX2  CBC WITH DIFFERENTIAL/PLATELET     EKG  ED ECG REPORT I, Blake Divine, the attending physician, personally viewed and interpreted this ECG.   Date: 08/11/2021  EKG Time: 20:42  Rate: 101  Rhythm: sinus tachycardia  Axis: RAD  Intervals:right bundle branch block  ST&T Change: None  PROCEDURES:  Critical Care performed: No  Procedures   MEDICATIONS ORDERED IN ED: Medications  clindamycin (CLEOCIN) IVPB 600 mg (has no administration in time range)  morphine (PF) 4 MG/ML injection 4 mg (4 mg Intravenous Given 08/11/21 2135)  ondansetron (ZOFRAN) injection 4 mg (4 mg Intravenous Given 08/11/21 2124)     IMPRESSION / MDM / ASSESSMENT AND PLAN / ED COURSE  I reviewed the triage  vital signs and the nursing notes.                              66 y.o. female with past medical history of diabetes, hypothyroidism, and CKD who presents to the ED with increased pain and swelling over her right jaw after recent diagnosis of dental infection and facial cellulitis earlier today in the ED.  Patient also reports that she felt itchy across her chest with some difficulty breathing after taking Augmentin earlier today.  Differential diagnosis includes, but is not limited to, anaphylaxis, other allergic reaction, dental abscess, facial cellulitis, Ludwig's angina.  Patient is nontoxic-appearing and in no acute distress, reports some difficulty breathing earlier but this now seems to have improved and she is maintaining O2 sats on room air.  Posterior oropharynx is clear although she does have significant dental infection with apparent associated facial cellulitis.  No evidence to suggest Ludwick's angina and CT obtained earlier today did not show significant abscess.  No findings to suggest anaphylaxis at this time although patient may have had mild reaction to Augmentin.  We will give IV dose of clindamycin and recheck labs, additionally treat symptomatically with IV morphine and Zofran.  Patient's symptoms are improved following morphine and zofran, she is now resting comfortably and denies any ongoing difficulty breathing.  There may have been a component of anxiety to her difficulty breathing, no ongoing findings to suggest anaphylaxis.  However, with her facial swelling worsening and now with some difficulty swallowing, patient would benefit from admission for IV antibiotics.  CBC is unremarkable with no anemia or leukocytosis, BMP with mild hyperglycemia but otherwise unremarkable.  Case discussed with hospitalist for admission.      FINAL CLINICAL IMPRESSION(S) / ED DIAGNOSES   Final diagnoses:  Facial cellulitis  Dental infection     Rx / DC Orders   ED Discharge Orders      None        Note:  This document was prepared using Dragon voice recognition software and may include unintentional dictation errors.   Blake Divine, MD 08/11/21 2211

## 2021-08-11 NOTE — Assessment & Plan Note (Addendum)
Patient reports itching and feeling of difficulty breathing following first dose of Augmentin given during earlier visit to the ED though she has taken penicillin before Resolved Uncertain whether allergy, somewhat doubtful as patient states has taken penicillin/Amoxil several times previously

## 2021-08-11 NOTE — Assessment & Plan Note (Addendum)
--  no respiratory symptoms.  Not hypoxic.  CXR clear --d/c'ed IV Remdesivir --cont Paxlovid

## 2021-08-11 NOTE — ED Notes (Signed)
SQ not printing. IT ticket placed and specimens sent with chart labels.

## 2021-08-11 NOTE — ED Triage Notes (Signed)
Pt arrives via POV from home. Pt diagnosed with COVID earlier today. Pt initially reported being here for facial swelling that was thought to come from antibiotic that pt started today. However, after further assessment, pt reports swelling has been present for multiple days. Pt now reporting feeling short of breath since 1500 today. Breathing equal and unlabored at this time.

## 2021-08-11 NOTE — Progress Notes (Signed)
Remdesivir - Pharmacy Brief Note   O:  ALT: 20 CXR:  SpO2: 98 % on RA   A/P:  Remdesivir 200 mg IVPB once followed by 100 mg IVPB daily x 4 days.   Javi Bollman D 08/11/2021 11:02 PM

## 2021-08-11 NOTE — Assessment & Plan Note (Signed)
Continue levothyroxine 

## 2021-08-11 NOTE — ED Triage Notes (Signed)
Pt via POV from home. Pt c/o lower jaw pain that she noted this morning. Pt also states she having dental pain in the frontal lower jaw. States that she has poor dentation. Denies fever. Redness and swelling noted to R lower jaw. Pt is A&OX4 and NAD

## 2021-08-11 NOTE — Assessment & Plan Note (Addendum)
Cr trending up due to poor hydration.

## 2021-08-11 NOTE — ED Notes (Signed)
See triage note.presents with some redness and swelling to right side of face and jaw area

## 2021-08-11 NOTE — Assessment & Plan Note (Addendum)
BG within inpatient goal --d/c'ed BG checks and SSI

## 2021-08-11 NOTE — Assessment & Plan Note (Addendum)
Likely secondary to dental caries --reported itching over anterior neck with Augmentin.  Erythema around the chin improving on IV clinda --cont IV clinda --can consider discharge on oral clinda tomorrow

## 2021-08-11 NOTE — Assessment & Plan Note (Addendum)
--  pt reportedly had itching over anterior neck with Augmentin.  Dental pain improved on IV clinda --cont IV clinda --can consider discharge on oral clinda tomorrow --need outpatient dental followup

## 2021-08-11 NOTE — H&P (Signed)
History and Physical    Patient: [name] QIW:[id number] DOB: [birth year] DOA: 08/11/2021 DOS: the patient was seen and examined on 08/11/2021 PCP: Maryland Pink, MD  Patient coming from: Home  Chief Complaint:  Chief Complaint  Patient presents with   Shortness of Breath    HPI: [name] is a 66 y.o. female with medical history significant of DM, hypothyroidism, CKD 3A, who presents to the emergency room for the second time in 24 hours with a complaint of pain of the right lower jaw, with known history of dental caries.  Patient was seen earlier in the ED and had a CT soft tissue neck concerning for facial cellulitis in the mandibular area related to the multiple dental infections.  She was discharged with Augmentin but developed itching and difficulty breathing which was short-lived and subsequently return to the ED.  On her return she reported resolution of the shortness of breath and itching.  She continues to have pain in the jaw.  She denies fever or chills, denies drooling, difficulty swallowing or difficulty opening her mouth. Endorses coughing, sneezing and nasal congestion for the past two days  ED course: Tmax 99.5 with pulse of 109, respirations 22 and BP 167/83 Blood work WBC 7.6. CMP and CBC unremarkable.  Creatinine at baseline at 1.06 COVID-positive  Imaging: CT soft tissue neck with contrast: Done during first visit to the ED Subtle perimandibular soft tissue swelling/cellulitis with punctate focus of gas within the floor of the mouth, possibly nonspecific, cannot rule out tiny soft tissue abscess at the site Please review complete report  Patient started on clindamycin IV.  Hospitalist consulted for admission.   Review of Systems: As mentioned in the history of present illness. All other systems reviewed and are negative. Past Medical History:  Diagnosis Date   Arthritis    Blood dyscrasia    patient stated she is a "free bleeder"   Diabetes mellitus  without complication ([place])    Diverticulosis    Hypothyroidism    Neuropathy    Renal insufficiency    Past Surgical History:  Procedure Laterality Date   ABDOMINAL HYSTERECTOMY     BILATERAL SALPINGECTOMY Bilateral 03/16/2017   Procedure: BILATERAL SALPINGECTOMY;  Surgeon: Schermerhorn, Gwen Her, MD;  Location: [facility] ORS;  Service: Gynecology;  Laterality: Bilateral;   COLONOSCOPY WITH PROPOFOL N/A 08/18/2018   Procedure: COLONOSCOPY WITH PROPOFOL;  Surgeon: Manya Silvas, MD;  Location: [facility] ENDOSCOPY;  Service: Endoscopy;  Laterality: N/A;   CYSTOCELE REPAIR N/A 03/16/2017   Procedure: ANTERIOR REPAIR (CYSTOCELE);  Surgeon: Schermerhorn, Gwen Her, MD;  Location: [facility] ORS;  Service: Gynecology;  Laterality: N/A;   ESOPHAGOGASTRODUODENOSCOPY (EGD) WITH PROPOFOL N/A 08/18/2018   Procedure: ESOPHAGOGASTRODUODENOSCOPY (EGD) WITH PROPOFOL;  Surgeon: Manya Silvas, MD;  Location: [facility] ENDOSCOPY;  Service: Endoscopy;  Laterality: N/A;   TEAR DUCT PROBING Right    TUBAL LIGATION     VAGINAL HYSTERECTOMY N/A 03/16/2017   Procedure: HYSTERECTOMY VAGINAL;  Surgeon: Schermerhorn, Gwen Her, MD;  Location: [facility] ORS;  Service: Gynecology;  Laterality: N/A;   WISDOM TOOTH EXTRACTION     Social History:  reports that she has never smoked. She has never used smokeless tobacco. She reports that she does not drink alcohol and does not use drugs.  Not on File  Family History  Problem Relation Age of Onset   Breast cancer Maternal Aunt    Hypertension Mother    Hypertension Father    Diabetes Father     Prior to Admission  medications   Medication Sig Start Date End Date Taking? Authorizing Provider  amoxicillin-clavulanate (AUGMENTIN) 875-125 MG tablet Take 1 tablet by mouth 2 (two) times daily for 7 days. 08/11/21 08/18/21  Lavonia Drafts, MD  gabapentin (NEURONTIN) 100 MG capsule Take 200 mg by mouth 2 (two) times daily.  08/16/15   [provider]  glipiZIDE (GLUCOTROL) 5 MG tablet Take 5  mg by mouth 2 (two) times daily before a meal.  06/02/16   [provider]  ibuprofen (ADVIL,MOTRIN) 200 MG tablet Take 200 mg by mouth every 6 (six) hours as needed for moderate pain.    [provider]  levothyroxine (SYNTHROID, LEVOTHROID) 75 MCG tablet Take 75 mcg by mouth daily.  10/12/17   [provider]  metFORMIN (GLUCOPHAGE-XR) 500 MG 24 hr tablet Take 1,000 mg by mouth daily.  08/31/15   [provider]  naproxen sodium (ALEVE) 220 MG tablet Take 220 mg by mouth 2 (two) times daily as needed (foot pain).    [provider]  omeprazole (PRILOSEC) 40 MG capsule Take 40 mg by mouth daily. Take 30 min after Synthroid and 30 min before breakfast    [provider]  omeprazole (PRILOSEC) 40 MG capsule Take 1 capsule (40 mg total) by mouth daily. 04/11/19 05/11/19  Lilia Pro., MD  ondansetron (ZOFRAN ODT) 4 MG disintegrating tablet Take 1 tablet (4 mg total) by mouth every 8 (eight) hours as needed for nausea or vomiting. 01/13/21   Naaman Plummer, MD  oxyCODONE-acetaminophen (PERCOCET/ROXICET) 5-325 MG tablet Take 1 tablet by mouth every 4 (four) hours as needed for severe pain.    [provider]  tolterodine (DETROL) 2 MG tablet Take 2 mg by mouth daily.     [provider]    Physical Exam: Vitals:   08/11/21 2026 08/11/21 2100  BP: (!) 167/83 (!) 184/84  Pulse: (!) 109 96  Resp: (!) 22 13  Temp: 99.5 F (37.5 C)   TempSrc: Oral   SpO2: 95% 98%   Physical Exam Constitutional:      Appearance: She is well-developed.  HENT:     Head: Normocephalic and atraumatic.     Comments: Pain and redness over right chin    Mouth/Throat:     Dentition: Dental caries present.  Cardiovascular:     Rate and Rhythm: Tachycardia present.  Pulmonary:     Effort: Pulmonary effort is normal.     Breath sounds: Normal breath sounds.  Abdominal:     General: Bowel sounds are normal.     Palpations: Abdomen is soft.      Tenderness: There is no abdominal tenderness.  Skin:    General: Skin is warm and dry.  Neurological:     General: No focal deficit present.     Mental Status: She is alert and oriented to person, place, and time.  Psychiatric:        Mood and Affect: Mood normal.        Behavior: Behavior normal.     Data Reviewed: Notes from primary care and specialist visits, past discharge summaries. Prior diagnostic testing as applicable to current admission diagnoses Updated medications and problem lists for reconciliation ED course, including vitals, labs, imaging, treatment and response to treatment Triage notes and ED providers notes     Assessment and Plan: * Facial cellulitis Likely secondary to dental caries Continue IV clindamycin  Dental caries For dental referral at discharge  COVID-19 virus infection Remdesivir, antitussives CXR  covid precautions  Non-dose-related adverse reaction to medication Patient reports itching and feeling of difficulty breathing following first dose of Augmentin given during earlier visit to the ED though she has taken penicillin before Resolved Uncertain whether allergy, somewhat doubtful as patient states has taken penicillin/Amoxil several times previously  Stage 3a chronic kidney disease ([place]) Renal function at baseline  Hypothyroidism Continue levothyroxine  Diabetes mellitus without complication ([place]) Sliding scale insulin coverage       Advance Care Planning:   Code Status: Prior none  Consults: none  Family Communication: none  Severity of Illness: The appropriate patient status for this patient is INPATIENT. Inpatient status is judged to be reasonable and necessary in order to provide the required intensity of service to ensure the patient's safety. The patient's presenting symptoms, physical exam findings, and initial radiographic and laboratory data in the context of their chronic comorbidities is felt to place them at  high risk for further clinical deterioration. Furthermore, it is not anticipated that the patient will be medically stable for discharge from the hospital within 2 midnights of admission.   * I certify that at the point of admission it is my clinical judgment that the patient will require inpatient hospital care spanning beyond 2 midnights from the point of admission due to high intensity of service, high risk for further deterioration and high frequency of surveillance required.*  Author: Athena Masse, MD 08/11/2021 10:16 PM  For on call review [email or web address].

## 2021-08-11 NOTE — ED Provider Notes (Signed)
[facility] Provider Note    Event Date/Time   First MD Initiated Contact with Patient 08/11/21 1118     (approximate)   History   Jaw Pain   HPI  [name] is a 66 y.o. female who presents with complaints of swelling to her chin which she reports she noticed this morning.  Yesterday she was feeling fine although she has had a scratchy throat for a few days.  She denies difficulty swallowing.  She has had some headaches as well.  No body aches reported.  She reports that she has "bad teeth ".     Physical Exam   Triage Vital Signs: ED Triage Vitals [08/11/21 1109]  Enc Vitals Group     BP (!) 150/70     Pulse Rate 93     Resp 20     Temp 98 F (36.7 C)     Temp src      SpO2 99 %     Weight 68 kg (150 lb)     Height 1.702 m (5\' 7" )     Head Circumference      Peak Flow      Pain Score 9     Pain Loc      Pain Edu?      Excl. in [place]?     Most recent vital signs: Vitals:   08/11/21 1109  BP: (!) 150/70  Pulse: 93  Resp: 20  Temp: 98 F (36.7 C)  SpO2: 99%     General: Awake, no distress.  CV:  Good peripheral perfusion.  Resp:  Normal effort.  Abd:  No distention.  Other:  Swelling and redness to the chin and extending onto the superior anterior neck, pharynx is normal, very poor dentition   ED Results / Procedures / Treatments   Labs (all labs ordered are listed, but only abnormal results are displayed) Labs Reviewed  RESP PANEL BY RT-PCR (FLU A&B, COVID) ARPGX2 - Abnormal; Notable for the following components:      Result Value   SARS Coronavirus 2 by RT PCR POSITIVE (*)    All other components within normal limits  COMPREHENSIVE METABOLIC PANEL - Abnormal; Notable for the following components:   Glucose, Bld 171 (*)    Creatinine, Ser 1.21 (*)    GFR, Estimated 50 (*)    All other components within normal limits  GROUP A STREP BY PCR  CBC     EKG     RADIOLOGY CT soft tissue neck reviewed by me, no  significant abscess noted, pending radiology read    PROCEDURES:  Critical Care performed:   Procedures   MEDICATIONS ORDERED IN ED: Medications  iohexol (OMNIPAQUE) 300 MG/ML solution 75 mL (75 mLs Intravenous Contrast Given 08/11/21 1313)     IMPRESSION / MDM / [place] / ED COURSE  I reviewed the triage vital signs and the nursing notes.  Patient presents with swelling and redness to the chin suspicious for infection, likely odontogenic some extension of erythema to the superior neck, will obtain labs, CT imaging   Lab work is overall quite reassuring however patient's COVID test is positive.  CBC is normal, CMP is overall unremarkable  CT scan without evidence of significant abscess  Thyroid nodule noted and discussed with patient and her husband for outpatient follow-up  Will prescribe Augmentin, outpatient follow-up recommended, return precautions discussed            FINAL CLINICAL IMPRESSION(S) /  ED DIAGNOSES   Final diagnoses:  Cellulitis of face  Thyroid nodule  COVID-19     Rx / DC Orders   ED Discharge Orders          Ordered    amoxicillin-clavulanate (AUGMENTIN) 875-125 MG tablet  2 times daily        08/11/21 1347             Note:  This document was prepared using Dragon voice recognition software and may include unintentional dictation errors.   Lavonia Drafts, MD 08/11/21 1410

## 2021-08-12 DIAGNOSIS — L0390 Cellulitis, unspecified: Secondary | ICD-10-CM | POA: Diagnosis present

## 2021-08-12 DIAGNOSIS — L03211 Cellulitis of face: Secondary | ICD-10-CM | POA: Diagnosis not present

## 2021-08-12 LAB — GLUCOSE, CAPILLARY
Glucose-Capillary: 197 mg/dL — ABNORMAL HIGH (ref 70–99)
Glucose-Capillary: 144 mg/dL — ABNORMAL HIGH (ref 70–99)
Glucose-Capillary: 125 mg/dL — ABNORMAL HIGH (ref 70–99)

## 2021-08-12 LAB — HEMOGLOBIN A1C
Hgb A1c MFr Bld: 7.2 % — ABNORMAL HIGH (ref 4.8–5.6)
Mean Plasma Glucose: 159.94 mg/dL

## 2021-08-12 LAB — HIV ANTIBODY (ROUTINE TESTING W REFLEX): HIV Screen 4th Generation wRfx: NONREACTIVE

## 2021-08-12 MED ORDER — NIRMATRELVIR/RITONAVIR (PAXLOVID) TABLET (RENAL DOSING)
2.0000 | ORAL_TABLET | Freq: Two times a day (BID) | ORAL | Status: DC
  Administered 2021-08-12 – 2021-08-16 (×9): 2 via ORAL
  Filled 2021-08-12: qty 20

## 2021-08-12 MED ORDER — HYDROCODONE-ACETAMINOPHEN 5-325 MG PO TABS
1.0000 | ORAL_TABLET | ORAL | Status: DC | PRN
  Administered 2021-08-14: 1 via ORAL
  Filled 2021-08-12: qty 1

## 2021-08-12 NOTE — Progress Notes (Signed)
°  Progress Note   Patient: [name] BZM:[id number] DOB: 1956/04/10 DOA: 08/11/2021     1 DOS: the patient was seen and examined on 08/12/2021   Brief hospital course: No notes on file  Assessment and Plan: * Facial cellulitis Likely secondary to dental caries --reported itching over anterior neck with Augmentin Continue IV clindamycin  COVID-19 virus infection --no respiratory symptoms.  Not hypoxic.  CXR clear --d/c IV Remdesivir --start Paxlovid  Non-dose-related adverse reaction to medication Patient reports itching and feeling of difficulty breathing following first dose of Augmentin given during earlier visit to the ED though she has taken penicillin before Resolved Uncertain whether allergy, somewhat doubtful as patient states has taken penicillin/Amoxil several times previously  Stage 3a chronic kidney disease ([place]) Renal function at baseline  Hypothyroidism Continue levothyroxine  Diabetes mellitus without complication (HCC) BG within inpatient goal --d/c BG checks and SSI  Dental caries --pt reportedly had itching over anterior neck with Augmentin --cont IV clinda --need outpatient dental followup   DVT prophylaxis: Lovenox SQ Code Status: Full code  Family Communication: husband updated at bedside today  Status is: changed to obs per [facility]  Dispo:   The patient is from: home Anticipated d/c is to: home Anticipated d/c date is: 1-2 days Patient currently is not medically stable to d/c due to: IV clinda for cellulitis      Subjective:  Pt complained of pain on the left side of her jaw.  Was noted to be sleepy.     Physical Exam: Vitals:   08/12/21 0503 08/12/21 0810 08/12/21 1131 08/12/21 1640  BP: (!) 124/54 127/67 (!) 141/66 (!) 116/57  Pulse: 80 88 87 89  Resp: 16 16 16 18   Temp: 98 F (36.7 C) 98.2 F (36.8 C) 98.9 F (37.2 C) 98.6 F (37 C)  TempSrc: Oral Oral Oral Oral  SpO2: 97% 98% 100% 97%  Weight:      Height:        Constitutional: NAD, sleepy, oriented HEENT: conjunctivae and lids normal, EOMI, erythema around her chin CV: No cyanosis.   RESP: normal respiratory effort, on RA SKIN: warm, dry Neuro: II - XII grossly intact.   Psych: depressed mood and affect.     Author: Enzo Bi, MD 08/12/2021 5:27 PM  For on call review [email or web address]

## 2021-08-12 NOTE — TOC Initial Note (Signed)
Transition of Care [facility]) - Initial/Assessment Note    Patient Details  Name: Lavina Resor MRN: [id number] Date of Birth: 14-Jan-1956  Transition of Care [facility]) CM/SW Contact:    Kerin Salen, RN Phone Number: 08/12/2021, 11:46 AM  Clinical Narrative:   Transition of Care (TOC) Screening Note   Patient Details  Name: Shaana Acocella Date of Birth: 1956/06/17   Transition of Care [facility]) CM/SW Contact:    Kerin Salen, RN Phone Number: 08/12/2021, 11:46 AM    Transition of Care Department [facility]) has reviewed patient and no TOC needs have been identified at this time. We will continue to monitor patient advancement through interdisciplinary progression rounds. If new patient transition needs arise, please place a TOC consult.                        Patient Goals and CMS Choice        Expected Discharge Plan and Services                                                Prior Living Arrangements/Services                       Activities of Daily Living Home Assistive Devices/Equipment: Eyeglasses (pt did not bring with her to the hospital) ADL Screening (condition at time of admission) Patient's cognitive ability adequate to safely complete daily activities?: Yes Is the patient deaf or have difficulty hearing?: No Does the patient have difficulty seeing, even when wearing glasses/contacts?: No Does the patient have difficulty concentrating, remembering, or making decisions?: No Patient able to express need for assistance with ADLs?: Yes Does the patient have difficulty dressing or bathing?: No Independently performs ADLs?: Yes (appropriate for developmental age) Does the patient have difficulty walking or climbing stairs?: No Weakness of Legs: Both Weakness of Arms/Hands: None  Permission Sought/Granted                  Emotional Assessment              Admission diagnosis:  Shortness of breath [R06.02] Dental infection  [K04.7] Facial cellulitis [L03.211] Patient Active Problem List   Diagnosis Date Noted   Facial cellulitis 08/11/2021   Dental caries 08/11/2021   COVID-19 virus infection 08/11/2021   Diabetes mellitus without complication ([place])    Hypothyroidism    Stage 3a chronic kidney disease ([place])    Non-dose-related adverse reaction to medication    Post-operative state 03/16/2017   Acute renal failure (ARF) ([place]) 01/18/2017   Acute respiratory failure with hypoxia ([place]) 11/04/2015   PCP:  Mary[place] Pink, MD Pharmacy:   [facility] [address], [place] - [place] [address] [place] [address] Phone: [phone] Fax: [phone]     Social Determinants of Health (SDOH) Interventions    Readmission Risk Interventions No flowsheet data found.

## 2021-08-13 DIAGNOSIS — L03211 Cellulitis of face: Secondary | ICD-10-CM | POA: Diagnosis not present

## 2021-08-13 LAB — BASIC METABOLIC PANEL
Anion gap: 8 (ref 5–15)
BUN: 31 mg/dL — ABNORMAL HIGH (ref 8–23)
CO2: 23 mmol/L (ref 22–32)
Calcium: 8.6 mg/dL — ABNORMAL LOW (ref 8.9–10.3)
Chloride: 107 mmol/L (ref 98–111)
Creatinine, Ser: 1.55 mg/dL — ABNORMAL HIGH (ref 0.44–1.00)
GFR, Estimated: 37 mL/min — ABNORMAL LOW (ref >60)
Glucose, Bld: 139 mg/dL — ABNORMAL HIGH (ref 70–99)
Potassium: 4.2 mmol/L (ref 3.5–5.1)
Sodium: 138 mmol/L (ref 135–145)

## 2021-08-13 LAB — CBC
HCT: 34.8 % — ABNORMAL LOW (ref 36.0–46.0)
Hemoglobin: 11.3 g/dL — ABNORMAL LOW (ref 12.0–15.0)
MCH: 27.9 pg (ref 26.0–34.0)
MCHC: 32.5 g/dL (ref 30.0–36.0)
MCV: 85.9 fL (ref 80.0–100.0)
Platelets: 159 10*3/uL (ref 150–400)
RBC: 4.05 MIL/uL (ref 3.87–5.11)
RDW: 13.5 % (ref 11.5–15.5)
WBC: 5.9 10*3/uL (ref 4.0–10.5)
nRBC: 0 % (ref 0.0–0.2)

## 2021-08-13 LAB — MAGNESIUM: Magnesium: 1.9 mg/dL (ref 1.7–2.4)

## 2021-08-13 MED ORDER — ENSURE ENLIVE PO LIQD
237.0000 mL | Freq: Three times a day (TID) | ORAL | Status: DC
  Administered 2021-08-13 – 2021-08-15 (×6): 237 mL via ORAL

## 2021-08-13 MED ORDER — LEVOTHYROXINE SODIUM 50 MCG PO TABS
50.0000 ug | ORAL_TABLET | Freq: Every day | ORAL | Status: DC
  Administered 2021-08-14 – 2021-08-16 (×3): 50 ug via ORAL
  Filled 2021-08-13 (×3): qty 1

## 2021-08-13 MED ORDER — LACTATED RINGERS IV SOLN: INTRAVENOUS | Status: AC

## 2021-08-13 NOTE — Assessment & Plan Note (Signed)
--  Cr trending up to 1.55 this morning, likely due to poor oral intake and hydration --MIVF@75  for 12 hours --Ensure

## 2021-08-13 NOTE — Plan of Care (Signed)
Uneventful PM shift. Minimal c/o pain overnight. Facial swelling mildly improved in compare to yesterday initial assessment by this care RN.   Problem: Clinical Measurements: Goal: Ability to avoid or minimize complications of infection will improve Outcome: Progressing   Problem: Skin Integrity: Goal: Skin integrity will improve Outcome: Progressing

## 2021-08-13 NOTE — Progress Notes (Signed)
°  Progress Note   Patient: [name] YNW:[id number] DOB: Sep 03, 1955 DOA: 08/11/2021     1 DOS: the patient was seen and examined on 08/13/2021   Brief hospital course: No notes on file  Assessment and Plan: * Facial cellulitis Likely secondary to dental caries --reported itching over anterior neck with Augmentin.  Erythema around the chin improving on IV clinda --cont IV clinda --can consider discharge on oral clinda tomorrow  COVID-19 virus infection --no respiratory symptoms.  Not hypoxic.  CXR clear --d/c'ed IV Remdesivir --cont Paxlovid  Non-dose-related adverse reaction to medication Patient reports itching and feeling of difficulty breathing following first dose of Augmentin given during earlier visit to the ED though she has taken penicillin before Resolved Uncertain whether allergy, somewhat doubtful as patient states has taken penicillin/Amoxil several times previously  Stage 3a chronic kidney disease ([place]) Cr trending up due to poor hydration.  Hypothyroidism Continue levothyroxine  Diabetes mellitus without complication (HCC) BG within inpatient goal --d/c'ed BG checks and SSI  Dental caries --pt reportedly had itching over anterior neck with Augmentin.  Dental pain improved on IV clinda --cont IV clinda --can consider discharge on oral clinda tomorrow --need outpatient dental followup  AKI (acute kidney injury) ([place]) --Cr trending up to 1.55 this morning, likely due to poor oral intake and hydration --MIVF@75  for 12 hours --Ensure    DVT prophylaxis: Lovenox SQ Code Status: Full code  Family Communication: husband updated at bedside today  Status is: changed to obs per [facility]  Dispo:   The patient is from: home Anticipated d/c is to: home Anticipated d/c date is: likely tomorrow Patient currently is not medically stable to d/c due to: IV clinda for cellulitis, and developing AKI on MIVF      Subjective:  Pt appeared sleepy today, though husband  said she was more talkative today.  Has not requested any narcotics pain med, but reported tooth pain improved.  Not eating/drinking much.  Cr trending up.     Physical Exam: Vitals:   08/12/21 2237 08/13/21 0249 08/13/21 0821 08/13/21 1838  BP:  (!) 106/55 (!) 93/49 134/61  Pulse:  70 71 80  Resp:  16    Temp: 99.3 F (37.4 C) 98.4 F (36.9 C) 98.3 F (36.8 C) 97.9 F (36.6 C)  TempSrc: Oral     SpO2:  95% 93% 100%  Weight:      Height:        Constitutional: NAD, AAOx3, sitting in recliner HEENT: conjunctivae and lids normal, EOMI, erythema around chin improved CV: No cyanosis.   RESP: normal respiratory effort, on RA Neuro: II - XII grossly intact.   Psych: flat mood and affect.  Appropriate judgement and reason   Author: Enzo Bi, MD 08/13/2021 6:46 PM  For on call review [email or web address].

## 2021-08-14 DIAGNOSIS — L03211 Cellulitis of face: Secondary | ICD-10-CM | POA: Diagnosis not present

## 2021-08-14 LAB — CBC
HCT: 32.2 % — ABNORMAL LOW (ref 36.0–46.0)
Hemoglobin: 10.5 g/dL — ABNORMAL LOW (ref 12.0–15.0)
MCH: 28.5 pg (ref 26.0–34.0)
MCHC: 32.6 g/dL (ref 30.0–36.0)
MCV: 87.3 fL (ref 80.0–100.0)
Platelets: 165 10*3/uL (ref 150–400)
RBC: 3.69 MIL/uL — ABNORMAL LOW (ref 3.87–5.11)
RDW: 13.3 % (ref 11.5–15.5)
WBC: 5.1 10*3/uL (ref 4.0–10.5)
nRBC: 0 % (ref 0.0–0.2)

## 2021-08-14 LAB — BASIC METABOLIC PANEL
Anion gap: 6 (ref 5–15)
BUN: 32 mg/dL — ABNORMAL HIGH (ref 8–23)
CO2: 23 mmol/L (ref 22–32)
Calcium: 8.5 mg/dL — ABNORMAL LOW (ref 8.9–10.3)
Chloride: 108 mmol/L (ref 98–111)
Creatinine, Ser: 1.39 mg/dL — ABNORMAL HIGH (ref 0.44–1.00)
GFR, Estimated: 43 mL/min — ABNORMAL LOW (ref >60)
Glucose, Bld: 143 mg/dL — ABNORMAL HIGH (ref 70–99)
Potassium: 4 mmol/L (ref 3.5–5.1)
Sodium: 137 mmol/L (ref 135–145)

## 2021-08-14 LAB — MAGNESIUM: Magnesium: 2 mg/dL (ref 1.7–2.4)

## 2021-08-14 MED ORDER — ALUM & MAG HYDROXIDE-SIMETH 200-200-20 MG/5ML PO SUSP
30.0000 mL | ORAL | Status: DC | PRN
  Administered 2021-08-14: 30 mL via ORAL
  Filled 2021-08-14: qty 30

## 2021-08-14 MED ORDER — PANTOPRAZOLE SODIUM 40 MG IV SOLR
40.0000 mg | Freq: Once | INTRAVENOUS | Status: AC
  Administered 2021-08-14: 40 mg via INTRAVENOUS
  Filled 2021-08-14: qty 10

## 2021-08-14 MED ORDER — FAMOTIDINE IN NACL 20-0.9 MG/50ML-% IV SOLN
20.0000 mg | Freq: Once | INTRAVENOUS | Status: AC
  Administered 2021-08-14: 20 mg via INTRAVENOUS
  Filled 2021-08-14: qty 50

## 2021-08-14 NOTE — Progress Notes (Signed)
Patient choked on meat and was able to cough and get it up.  Vomitted thick mucus secretions afterward. Then started hiccupping and states feels like acid reflux.  Oxygen 98% on RA.  Maalox given. Will continue to monitor.Family at bedside.

## 2021-08-14 NOTE — Progress Notes (Signed)
PROGRESS NOTE    [name]  RCV:[id number] DOB: February 28, 1956 DOA: 08/11/2021 PCP: Maryland Pink, MD    Brief Narrative:  66 y.o. female with medical history significant of DM, hypothyroidism, CKD 3A, who presents to the emergency room for the second time in 24 hours with a complaint of pain of the right lower jaw, with known history of dental caries.  Patient was seen earlier in the ED and had a CT soft tissue neck concerning for facial cellulitis in the mandibular area related to the multiple dental infections.  She was discharged with Augmentin but developed itching and difficulty breathing which was short-lived and subsequently return to the ED.  On her return she reported resolution of the shortness of breath and itching.  She continues to have pain in the jaw.  She denies fever or chills, denies drooling, difficulty swallowing or difficulty opening her mouth. Endorses coughing, sneezing and nasal congestion for the past two days   Assessment & Plan:   Principal Problem:   Facial cellulitis Active Problems:   AKI (acute kidney injury) ([place])   Dental caries   Diabetes mellitus without complication (HCC)   Hypothyroidism   Stage 3a chronic kidney disease (HCC)   Non-dose-related adverse reaction to medication   COVID-19 virus infection   Cellulitis  Facial cellulitis Likely secondary to dental caries --reported itching over anterior neck with Augmentin.  Erythema around the chin improving on IV clinda -Physical exam not at baseline Plan: Continue IV clindamycin for now Anticipated discharge 2/9 on oral clindamycin Outpatient follow-up with a dentist   COVID-19 virus infection --no respiratory symptoms.  Not hypoxic.  CXR clear --d/c'ed IV Remdesivir --cont Paxlovid   Non-dose-related adverse reaction to medication Patient reports itching and feeling of difficulty breathing following first dose of Augmentin given during earlier visit to the ED though she has taken  penicillin before Resolved Uncertain whether allergy, somewhat doubtful as patient states has taken penicillin/Amoxil several times previously   Stage 3a chronic kidney disease ([place]) Cr trending up due to poor hydration. Improving   Hypothyroidism Continue levothyroxine   Diabetes mellitus without complication (HCC) BG within inpatient goal --d/c'ed BG checks and SSI   Dental caries --pt reportedly had itching over anterior neck with Augmentin.  Dental pain improved on IV clinda --cont IV clinda --can consider discharge on oral clinda tomorrow --need outpatient dental followup   AKI (acute kidney injury) ([place]) -- Creatinine trended up, suspect prerenal azotemia --MIVF@75  for 12 hours --Ensure    DVT prophylaxis: SQ Lovenox Code Status: Full Family Communication: None today Disposition Plan: Status is: Inpatient Remains inpatient appropriate because: Facial cellulitis secondary to dental caries.  On IV antibiotics.  Anticipate discharge 2/9           Level of care: Med-Surg  Consultants:  None  Procedures:  None  Antimicrobials: Clindamycin   Subjective: Seen and examined.  Still with some right-sided facial swelling  Objective: Vitals:   08/13/21 1838 08/13/21 2201 08/14/21 0530 08/14/21 0827  BP: 134/61 (!) 103/50 (!) 115/53 (!) 106/50  Pulse: 80 73 68 71  Resp:  18 16 18   Temp: 97.9 F (36.6 C) 97.9 F (36.6 C) 98.3 F (36.8 C) 97.8 F (36.6 C)  TempSrc:    Oral  SpO2: 100% 95% 96% 95%  Weight:      Height:        Intake/Output Summary (Last 24 hours) at 08/14/2021 1532 Last data filed at 08/14/2021 1256 Gross per 24 hour  Intake  1094.03 ml  Output 725 ml  Net 369.03 ml   Filed Weights   08/12/21 0236  Weight: 69.1 kg    Examination:  General exam: Appears calm and comfortable  Respiratory system: Clear to auscultation. Respiratory effort normal. Cardiovascular system: S1-S2, RRR, no murmurs, no pedal edema Gastrointestinal  system: Soft, NT/ND, normal acids Central nervous system: Alert and oriented. No focal neurological deficits. Extremities: Symmetric 5 x 5 power. Skin: No rashes, lesions or ulcers Psychiatry: Judgement and insight appear normal. Mood & affect appropriate.     Data Reviewed: I have personally reviewed following labs and imaging studies  CBC: Recent Labs  Lab 08/11/21 1200 08/11/21 2047 08/13/21 0521 08/14/21 0409  WBC 7.6 8.5 5.9 5.1  NEUTROABS  --  5.8  --   --   HGB 12.2 12.9 11.3* 10.5*  HCT 36.7 39.1 34.8* 32.2*  MCV 85.3 84.3 85.9 87.3  PLT 179 181 159 096   Basic Metabolic Panel: Recent Labs  Lab 08/11/21 1200 08/11/21 2047 08/13/21 0521 08/14/21 0409  NA 137 137 138 137  K 4.6 4.4 4.2 4.0  CL 108 105 107 108  CO2 23 24 23 23   GLUCOSE 171* 189* 139* 143*  BUN 23 21 31* 32*  CREATININE 1.21* 1.06* 1.55* 1.39*  CALCIUM 9.0 9.3 8.6* 8.5*  MG  --   --  1.9 2.0   GFR: Estimated Creatinine Clearance: 40.3 mL/min (A) (by C-G formula based on SCr of 1.39 mg/dL (H)). Liver Function Tests: Recent Labs  Lab 08/11/21 1200  AST 24  ALT 20  ALKPHOS 112  BILITOT 0.6  PROT 7.7  ALBUMIN 3.8   No results for input(s): LIPASE, AMYLASE in the last 168 hours. No results for input(s): AMMONIA in the last 168 hours. Coagulation Profile: No results for input(s): INR, PROTIME in the last 168 hours. Cardiac Enzymes: No results for input(s): CKTOTAL, CKMB, CKMBINDEX, TROPONINI in the last 168 hours. BNP (last 3 results) No results for input(s): PROBNP in the last 8760 hours. HbA1C: Recent Labs    08/11/21 2039  HGBA1C 7.2*   CBG: Recent Labs  Lab 08/11/21 2327 08/12/21 0807 08/12/21 1122 08/12/21 1623  GLUCAP 175* 197* 144* 125*   Lipid Profile: No results for input(s): CHOL, HDL, LDLCALC, TRIG, CHOLHDL, LDLDIRECT in the last 72 hours. Thyroid Function Tests: No results for input(s): TSH, T4TOTAL, FREET4, T3FREE, THYROIDAB in the last 72 hours. Anemia  Panel: No results for input(s): VITAMINB12, FOLATE, FERRITIN, TIBC, IRON, RETICCTPCT in the last 72 hours. Sepsis Labs: No results for input(s): PROCALCITON, LATICACIDVEN in the last 168 hours.  Recent Results (from the past 240 hour(s))  Resp Panel by RT-PCR (Flu A&B, Covid) Nasopharyngeal Swab     Status: Abnormal   Collection Time: 08/11/21 12:00 PM   Specimen: Nasopharyngeal Swab; Nasopharyngeal(NP) swabs in vial transport medium  Result Value Ref Range Status   SARS Coronavirus 2 by RT PCR POSITIVE (A) NEGATIVE Final    Comment: (NOTE) SARS-CoV-2 target nucleic acids are DETECTED.  The SARS-CoV-2 RNA is generally detectable in upper respiratory specimens during the acute phase of infection. Positive results are indicative of the presence of the identified virus, but do not rule out bacterial infection or co-infection with other pathogens not detected by the test. Clinical correlation with patient history and other diagnostic information is necessary to determine patient infection status. The expected result is Negative.  Fact Sheet for Patients: EntrepreneurPulse.com.au  Fact Sheet for Healthcare Providers: IncredibleEmployment.be  This test is  not yet approved or cleared by the [place] and  has been authorized for detection and/or diagnosis of SARS-CoV-2 by FDA under an Emergency Use Authorization (EUA).  This EUA will remain in effect (meaning this test can be used) for the duration of  the COVID-19 declaration under Section 564(b)(1) of the A ct, 21 U.S.C. section 360bbb-3(b)(1), unless the authorization is terminated or revoked sooner.     Influenza A by PCR NEGATIVE NEGATIVE Final   Influenza B by PCR NEGATIVE NEGATIVE Final    Comment: (NOTE) The Xpert Xpress SARS-CoV-2/FLU/RSV plus assay is intended as an aid in the diagnosis of influenza from Nasopharyngeal swab specimens and should not be used as a sole basis for  treatment. Nasal washings and aspirates are unacceptable for Xpert Xpress SARS-CoV-2/FLU/RSV testing.  Fact Sheet for Patients: EntrepreneurPulse.com.au  Fact Sheet for Healthcare Providers: IncredibleEmployment.be  This test is not yet approved or cleared by the [place] FDA and has been authorized for detection and/or diagnosis of SARS-CoV-2 by FDA under an Emergency Use Authorization (EUA). This EUA will remain in effect (meaning this test can be used) for the duration of the COVID-19 declaration under Section 564(b)(1) of the Act, 21 U.S.C. section 360bbb-3(b)(1), unless the authorization is terminated or revoked.  Performed at [facility], [place], [place] [address]   Group A Strep by PCR [facility] Only)     Status: None   Collection Time: 08/11/21 12:00 PM   Specimen: Nasopharyngeal Swab; Sterile Swab  Result Value Ref Range Status   Group A Strep by PCR NOT DETECTED NOT DETECTED Final    Comment: Performed at [facility], [address] [place],  [address]         Radiology Studies: No results found.      Scheduled Meds:  enoxaparin (LOVENOX) injection  40 mg Subcutaneous Q24H   feeding supplement  237 mL Oral TID BM   levothyroxine  50 mcg Oral Q0600   mouth rinse  15 mL Mouth Rinse BID   nirmatrelvir/ritonavir EUA (renal dosing)  2 tablet Oral BID   Continuous Infusions:  clindamycin (CLEOCIN) IV 600 mg (08/14/21 1322)     LOS: 1 day   \    Sidney Ace, MD [facility]   If 7PM-7AM, please contact night-coverage  08/14/2021, 3:32 PM

## 2021-08-15 DIAGNOSIS — L03211 Cellulitis of face: Secondary | ICD-10-CM | POA: Diagnosis not present

## 2021-08-15 LAB — BASIC METABOLIC PANEL
Anion gap: 8 (ref 5–15)
BUN: 30 mg/dL — ABNORMAL HIGH (ref 8–23)
CO2: 24 mmol/L (ref 22–32)
Calcium: 8.5 mg/dL — ABNORMAL LOW (ref 8.9–10.3)
Chloride: 106 mmol/L (ref 98–111)
Creatinine, Ser: 1.21 mg/dL — ABNORMAL HIGH (ref 0.44–1.00)
GFR, Estimated: 50 mL/min — ABNORMAL LOW (ref >60)
Glucose, Bld: 117 mg/dL — ABNORMAL HIGH (ref 70–99)
Potassium: 4.2 mmol/L (ref 3.5–5.1)
Sodium: 138 mmol/L (ref 135–145)

## 2021-08-15 LAB — CBC
HCT: 34.1 % — ABNORMAL LOW (ref 36.0–46.0)
Hemoglobin: 11.1 g/dL — ABNORMAL LOW (ref 12.0–15.0)
MCH: 28 pg (ref 26.0–34.0)
MCHC: 32.6 g/dL (ref 30.0–36.0)
MCV: 86.1 fL (ref 80.0–100.0)
Platelets: 184 10*3/uL (ref 150–400)
RBC: 3.96 MIL/uL (ref 3.87–5.11)
RDW: 13.5 % (ref 11.5–15.5)
WBC: 5.2 10*3/uL (ref 4.0–10.5)
nRBC: 0 % (ref 0.0–0.2)

## 2021-08-15 LAB — MAGNESIUM: Magnesium: 2.1 mg/dL (ref 1.7–2.4)

## 2021-08-15 MED ORDER — POLYETHYLENE GLYCOL 3350 17 G PO PACK
17.0000 g | PACK | Freq: Two times a day (BID) | ORAL | Status: DC
  Administered 2021-08-15 (×2): 17 g via ORAL
  Filled 2021-08-15 (×2): qty 1

## 2021-08-15 MED ORDER — SODIUM CHLORIDE 0.9 % IV SOLN: INTRAVENOUS | Status: DC | PRN

## 2021-08-15 MED ORDER — ONDANSETRON HCL 4 MG/2ML IJ SOLN
4.0000 mg | Freq: Four times a day (QID) | INTRAMUSCULAR | Status: DC | PRN
  Administered 2021-08-15: 4 mg via INTRAVENOUS
  Filled 2021-08-15: qty 2

## 2021-08-15 NOTE — Progress Notes (Signed)
PROGRESS NOTE    [name]  MLY:[id number] DOB: 07/03/1956 DOA: 08/11/2021 PCP: [place] Pink, MD    Brief Narrative:  67 y.o. female with medical history significant of DM, hypothyroidism, CKD 3A, who presents to the emergency room for the second time in 24 hours with a complaint of pain of the right lower jaw, with known history of dental caries.  Patient was seen earlier in the ED and had a CT soft tissue neck concerning for facial cellulitis in the mandibular area related to the multiple dental infections.  She was discharged with Augmentin but developed itching and difficulty breathing which was short-lived and subsequently return to the ED.  On her return she reported resolution of the shortness of breath and itching.  She continues to have pain in the jaw.  She denies fever or chills, denies drooling, difficulty swallowing or difficulty opening her mouth. Endorses coughing, sneezing and nasal congestion for the past two days  Witnessed aspiration event noted on 2/8.  Remained on RA.  Symptoms maily reflux related   Assessment & Plan:   Principal Problem:   Facial cellulitis Active Problems:   AKI (acute kidney injury) ([place])   Dental caries   Diabetes mellitus without complication (HCC)   Hypothyroidism   Stage 3a chronic kidney disease (HCC)   Non-dose-related adverse reaction to medication   COVID-19 virus infection   Cellulitis  Facial cellulitis Likely secondary to dental caries --reported itching over anterior neck with Augmentin.  Erythema around the chin improving on IV clinda -Physical exam not at baseline Plan: Continue IV clindamycin for now Anticipate dc 2/10 on oral clinda Outpatient follow-up with a dentist   COVID-19 virus infection --no respiratory symptoms.  Not hypoxic.  CXR clear --d/c'ed IV Remdesivir --cont Paxlovid  Aspiration event Patient choked on meat, but able to cough it up.  Occurred 2/9 Remained HD stable, on RA Plan: Reflux  treatment Continue clindamycin, should cover in the event of aspiration pna   Non-dose-related adverse reaction to medication Patient reports itching and feeling of difficulty breathing following first dose of Augmentin given during earlier visit to the ED though she has taken penicillin before Resolved Uncertain whether allergy, somewhat doubtful as patient states has taken penicillin/Amoxil several times previously   Stage 3a chronic kidney disease ([place]) Cr trending up due to poor hydration. Improving   Hypothyroidism Continue levothyroxine   Diabetes mellitus without complication (HCC) BG within inpatient goal --d/c'ed BG checks and SSI   Dental caries --pt reportedly had itching over anterior neck with Augmentin.  Dental pain improved on IV clinda --cont IV clinda --can consider discharge on oral clinda tomorrow --need outpatient dental followup   AKI (acute kidney injury) ([place]) -- Creatinine trended up, suspect prerenal azotemia --Now improved --No further IVF  Constipation Bowel regimen initiated   DVT prophylaxis: SQ Lovenox Code Status: Full Family Communication: None today Disposition Plan: Status is: Inpatient  Remains inpatient appropriate because: Facial cellulitis secondary to dental caries.  On IV antibiotics.  Anticipate discharge 2/10           Level of care: Med-Surg  Consultants:  None  Procedures:  None  Antimicrobials: Clindamycin   Subjective: Seen and examined.  Still with some right-sided facial swelling.  Complains of nausea and constipation  Objective: Vitals:   08/14/21 0827 08/14/21 2054 08/15/21 0556 08/15/21 1004  BP: (!) 106/50 (!) 108/57 124/61 125/63  Pulse: 71 68 74 69  Resp: 18 14 15 18   Temp: 97.8 F (36.6  C) 97.8 F (36.6 C) 97.6 F (36.4 C) 98.1 F (36.7 C)  TempSrc: Oral  Oral Oral  SpO2: 95% 98% 97% 99%  Weight:      Height:        Intake/Output Summary (Last 24 hours) at 08/15/2021 1408 Last data  filed at 08/15/2021 4403 Gross per 24 hour  Intake 150 ml  Output 300 ml  Net -150 ml   Filed Weights   08/12/21 0236  Weight: 69.1 kg    Examination:  General exam: No acute distress, appears fatigued Respiratory system: Bibasilar crackles.  Normal WOB,  Room air Cardiovascular system: S1-S2, RRR, no murmurs, no pedal edema Gastrointestinal system: Soft, NT/ND, normal acids Central nervous system: Alert and oriented. No focal neurological deficits. Extremities: Symmetric 5 x 5 power. Skin: No rashes, lesions or ulcers Psychiatry: Judgement and insight appear normal. Mood & affect appropriate.     Data Reviewed: I have personally reviewed following labs and imaging studies  CBC: Recent Labs  Lab 08/11/21 1200 08/11/21 2047 08/13/21 0521 08/14/21 0409 08/15/21 0412  WBC 7.6 8.5 5.9 5.1 5.2  NEUTROABS  --  5.8  --   --   --   HGB 12.2 12.9 11.3* 10.5* 11.1*  HCT 36.7 39.1 34.8* 32.2* 34.1*  MCV 85.3 84.3 85.9 87.3 86.1  PLT 179 181 159 165 474   Basic Metabolic Panel: Recent Labs  Lab 08/11/21 1200 08/11/21 2047 08/13/21 0521 08/14/21 0409 08/15/21 0412  NA 137 137 138 137 138  K 4.6 4.4 4.2 4.0 4.2  CL 108 105 107 108 106  CO2 23 24 23 23 24   GLUCOSE 171* 189* 139* 143* 117*  BUN 23 21 31* 32* 30*  CREATININE 1.21* 1.06* 1.55* 1.39* 1.21*  CALCIUM 9.0 9.3 8.6* 8.5* 8.5*  MG  --   --  1.9 2.0 2.1   GFR: Estimated Creatinine Clearance: 46.3 mL/min (A) (by C-G formula based on SCr of 1.21 mg/dL (H)). Liver Function Tests: Recent Labs  Lab 08/11/21 1200  AST 24  ALT 20  ALKPHOS 112  BILITOT 0.6  PROT 7.7  ALBUMIN 3.8   No results for input(s): LIPASE, AMYLASE in the last 168 hours. No results for input(s): AMMONIA in the last 168 hours. Coagulation Profile: No results for input(s): INR, PROTIME in the last 168 hours. Cardiac Enzymes: No results for input(s): CKTOTAL, CKMB, CKMBINDEX, TROPONINI in the last 168 hours. BNP (last 3 results) No  results for input(s): PROBNP in the last 8760 hours. HbA1C: No results for input(s): HGBA1C in the last 72 hours.  CBG: Recent Labs  Lab 08/11/21 2327 08/12/21 0807 08/12/21 1122 08/12/21 1623  GLUCAP 175* 197* 144* 125*   Lipid Profile: No results for input(s): CHOL, HDL, LDLCALC, TRIG, CHOLHDL, LDLDIRECT in the last 72 hours. Thyroid Function Tests: No results for input(s): TSH, T4TOTAL, FREET4, T3FREE, THYROIDAB in the last 72 hours. Anemia Panel: No results for input(s): VITAMINB12, FOLATE, FERRITIN, TIBC, IRON, RETICCTPCT in the last 72 hours. Sepsis Labs: No results for input(s): PROCALCITON, LATICACIDVEN in the last 168 hours.  Recent Results (from the past 240 hour(s))  Resp Panel by RT-PCR (Flu A&B, Covid) Nasopharyngeal Swab     Status: Abnormal   Collection Time: 08/11/21 12:00 PM   Specimen: Nasopharyngeal Swab; Nasopharyngeal(NP) swabs in vial transport medium  Result Value Ref Range Status   SARS Coronavirus 2 by RT PCR POSITIVE (A) NEGATIVE Final    Comment: (NOTE) SARS-CoV-2 target nucleic acids are DETECTED.  The SARS-CoV-2 RNA is generally detectable in upper respiratory specimens during the acute phase of infection. Positive results are indicative of the presence of the identified virus, but do not rule out bacterial infection or co-infection with other pathogens not detected by the test. Clinical correlation with patient history and other diagnostic information is necessary to determine patient infection status. The expected result is Negative.  Fact Sheet for Patients: EntrepreneurPulse.com.au  Fact Sheet for Healthcare Providers: IncredibleEmployment.be  This test is not yet approved or cleared by the [place] FDA and  has been authorized for detection and/or diagnosis of SARS-CoV-2 by FDA under an Emergency Use Authorization (EUA).  This EUA will remain in effect (meaning this test can be used) for the  duration of  the COVID-19 declaration under Section 564(b)(1) of the A ct, 21 U.S.C. section 360bbb-3(b)(1), unless the authorization is terminated or revoked sooner.     Influenza A by PCR NEGATIVE NEGATIVE Final   Influenza B by PCR NEGATIVE NEGATIVE Final    Comment: (NOTE) The Xpert Xpress SARS-CoV-2/FLU/RSV plus assay is intended as an aid in the diagnosis of influenza from Nasopharyngeal swab specimens and should not be used as a sole basis for treatment. Nasal washings and aspirates are unacceptable for Xpert Xpress SARS-CoV-2/FLU/RSV testing.  Fact Sheet for Patients: EntrepreneurPulse.com.au  Fact Sheet for Healthcare Providers: IncredibleEmployment.be  This test is not yet approved or cleared by the [place] FDA and has been authorized for detection and/or diagnosis of SARS-CoV-2 by FDA under an Emergency Use Authorization (EUA). This EUA will remain in effect (meaning this test can be used) for the duration of the COVID-19 declaration under Section 564(b)(1) of the Act, 21 U.S.C. section 360bbb-3(b)(1), unless the authorization is terminated or revoked.  Performed at [facility], [place], [place] [address]   Group A Strep by PCR [facility] Only)     Status: None   Collection Time: 08/11/21 12:00 PM   Specimen: Nasopharyngeal Swab; Sterile Swab  Result Value Ref Range Status   Group A Strep by PCR NOT DETECTED NOT DETECTED Final    Comment: Performed at [facility], [address], [place], [place] [address]         Radiology Studies: No results found.      Scheduled Meds:  enoxaparin (LOVENOX) injection  40 mg Subcutaneous Q24H   feeding supplement  237 mL Oral TID BM   levothyroxine  50 mcg Oral Q0600   mouth rinse  15 mL Mouth Rinse BID   nirmatrelvir/ritonavir EUA (renal dosing)  2 tablet Oral BID   polyethylene glycol  17 g Oral BID   Continuous Infusions:  clindamycin  (CLEOCIN) IV 600 mg (08/15/21 1317)     LOS: 2 days     Sidney Ace, MD [facility]   If 7PM-7AM, please contact night-coverage  08/15/2021, 2:08 PM

## 2021-08-16 DIAGNOSIS — L03211 Cellulitis of face: Secondary | ICD-10-CM | POA: Diagnosis not present

## 2021-08-16 LAB — BASIC METABOLIC PANEL
Anion gap: 7 (ref 5–15)
BUN: 30 mg/dL — ABNORMAL HIGH (ref 8–23)
CO2: 24 mmol/L (ref 22–32)
Calcium: 8.5 mg/dL — ABNORMAL LOW (ref 8.9–10.3)
Chloride: 107 mmol/L (ref 98–111)
Creatinine, Ser: 1.25 mg/dL — ABNORMAL HIGH (ref 0.44–1.00)
GFR, Estimated: 48 mL/min — ABNORMAL LOW (ref >60)
Glucose, Bld: 191 mg/dL — ABNORMAL HIGH (ref 70–99)
Potassium: 4.3 mmol/L (ref 3.5–5.1)
Sodium: 138 mmol/L (ref 135–145)

## 2021-08-16 LAB — CBC
HCT: 35.2 % — ABNORMAL LOW (ref 36.0–46.0)
Hemoglobin: 11.4 g/dL — ABNORMAL LOW (ref 12.0–15.0)
MCH: 28.1 pg (ref 26.0–34.0)
MCHC: 32.4 g/dL (ref 30.0–36.0)
MCV: 86.7 fL (ref 80.0–100.0)
Platelets: 210 10*3/uL (ref 150–400)
RBC: 4.06 MIL/uL (ref 3.87–5.11)
RDW: 13.4 % (ref 11.5–15.5)
WBC: 5 10*3/uL (ref 4.0–10.5)
nRBC: 0 % (ref 0.0–0.2)

## 2021-08-16 LAB — MAGNESIUM: Magnesium: 2.3 mg/dL (ref 1.7–2.4)

## 2021-08-16 MED ORDER — CLINDAMYCIN HCL 150 MG PO CAPS: 150.0000 mg | ORAL_CAPSULE | Freq: Three times a day (TID) | ORAL | 0 refills | Status: DC

## 2021-08-16 MED ORDER — CLINDAMYCIN HCL 150 MG PO CAPS: 450.0000 mg | ORAL_CAPSULE | Freq: Three times a day (TID) | ORAL | 0 refills | Status: AC

## 2021-08-16 MED ORDER — SENNOSIDES-DOCUSATE SODIUM 8.6-50 MG PO TABS: 1.0000 | ORAL_TABLET | Freq: Two times a day (BID) | ORAL | 0 refills | Status: AC | PRN

## 2021-08-16 NOTE — Discharge Summary (Signed)
Physician Discharge Summary  [name] PFX:[id number] DOB: 03/01/1956 DOA: 08/11/2021  PCP: [place] Pink, MD  Admit date: 08/11/2021 Discharge date: 08/16/2021  Admitted From: Home Disposition: Home  Recommendations for Outpatient Follow-up:  Follow up with PCP in 1-2 weeks   Home Health: No Equipment/Devices: None  Discharge Condition: Stable CODE STATUS: Full Diet recommendation: Soft  Brief/Interim Summary:  66 y.o. female with medical history significant of DM, hypothyroidism, CKD 3A, who presents to the emergency room for the second time in 24 hours with a complaint of pain of the right lower jaw, with known history of dental caries.  Patient was seen earlier in the ED and had a CT soft tissue neck concerning for facial cellulitis in the mandibular area related to the multiple dental infections.  She was discharged with Augmentin but developed itching and difficulty breathing which was short-lived and subsequently return to the ED.  On her return she reported resolution of the shortness of breath and itching.  She continues to have pain in the jaw.  She denies fever or chills, denies drooling, difficulty swallowing or difficulty opening her mouth. Endorses coughing, sneezing and nasal congestion for the past two days   Witnessed aspiration event noted on 2/8.  Remained on RA.  Symptoms maily reflux related.  Patient remained stable on room air after this event.  No recurrence of aspiration noted.  On day of discharge patient is hemodynamically stable.  Kidney function is at or near baseline.  Will discharge home at this time.  Complete 7-day course of antibiotics.  4 additional days of p.o. clindamycin has been provided at time of discharge.  Patient will need close follow-up with a dentist which I have explained to her.  Stable for discharge home at this time.    Discharge Diagnoses:  Principal Problem:   Facial cellulitis Active Problems:   AKI (acute kidney injury) ([place])    Dental caries   Diabetes mellitus without complication ([place])   Hypothyroidism   Stage 3a chronic kidney disease ([facility])   Non-dose-related adverse reaction to medication   COVID-19 virus infection   Cellulitis  Facial cellulitis Likely secondary to dental caries --reported itching over anterior neck with Augmentin.  Erythema around the chin improving on IV clinda -Physical exam at baseline at time of discharge Plan: Discharge home.  Transition to p.o. clindamycin.  Complete additional 4 days for total 7-day antibiotic course.   COVID-19 virus infection --no respiratory symptoms.  Not hypoxic.  CXR clear --d/c'ed IV Remdesivir --cont Paxlovid, course completed in house   Aspiration event Patient choked on meat, but able to cough it up.  Occurred 2/9 Remained HD stable, on RA Plan: Reflux treatment Continue clindamycin, should cover in the event of aspiration pna  Discharge Instructions  Discharge Instructions     Diet - low sodium heart healthy   Complete by: As directed    Increase activity slowly   Complete by: As directed       Allergies as of 08/16/2021       Reactions   Augmentin [amoxicillin-pot Clavulanate] Itching        Medication List     STOP taking these medications    amoxicillin-clavulanate 875-125 MG tablet Commonly known as: Augmentin       TAKE these medications    calcium carbonate 1250 (500 Ca) MG tablet Commonly known as: OS-CAL - dosed in mg of elemental calcium Take 1 tablet by mouth daily with breakfast.   clindamycin 150 MG capsule Commonly  known as: Cleocin Take 3 capsules (450 mg total) by mouth 3 (three) times daily for 4 days.   dapagliflozin propanediol 10 MG Tabs tablet Commonly known as: FARXIGA Take 10 mg by mouth daily.   gabapentin 100 MG capsule Commonly known as: NEURONTIN Take 200 mg by mouth 2 (two) times daily.   glipiZIDE 5 MG tablet Commonly known as: GLUCOTROL Take 5 mg by mouth 2 (two) times daily  before a meal.   ibuprofen 200 MG tablet Commonly known as: ADVIL Take 200 mg by mouth every 6 (six) hours as needed for moderate pain.   insulin glargine 100 UNIT/ML Solostar Pen Commonly known as: LANTUS Inject 14 Units into the skin at bedtime.   lactase 3000 units tablet Commonly known as: LACTAID Take 3,000 Units by mouth daily before breakfast.   levothyroxine 50 MCG tablet Commonly known as: SYNTHROID Take 50 mcg by mouth daily.   metFORMIN 500 MG 24 hr tablet Commonly known as: GLUCOPHAGE-XR Take 1,000 mg by mouth daily.   naproxen sodium 220 MG tablet Commonly known as: ALEVE Take 220 mg by mouth 2 (two) times daily as needed (foot pain).   omeprazole 40 MG capsule Commonly known as: PRILOSEC Take 40 mg by mouth 2 (two) times daily. Take 30 min after Synthroid and 30 min before breakfast What changed: Another medication with the same name was removed. Continue taking this medication, and follow the directions you see here.   ondansetron 4 MG disintegrating tablet Commonly known as: Zofran ODT Take 1 tablet (4 mg total) by mouth every 8 (eight) hours as needed for nausea or vomiting.   oxyCODONE-acetaminophen 5-325 MG tablet Commonly known as: PERCOCET/ROXICET Take 1 tablet by mouth every 4 (four) hours as needed for severe pain.   psyllium 0.52 g capsule Commonly known as: REGULOID Take 0.52 g by mouth daily.   senna-docusate 8.6-50 MG tablet Commonly known as: Senokot-S Take 1 tablet by mouth 2 (two) times daily as needed for up to 7 days for mild constipation.   solifenacin 10 MG tablet Commonly known as: VESICARE Take 10 mg by mouth daily.        Allergies  Allergen Reactions   Augmentin [Amoxicillin-Pot Clavulanate] Itching    Consultations: None   Procedures/Studies: CT Soft Tissue Neck W Contrast  Result Date: 08/11/2021 CLINICAL DATA:  Provided history: Soft tissue swelling, infection suspected. Additional history provided: Patient  reports lower jaw pain since this morning. Dental pain from lower jaw. Poor dentition. EXAM: CT NECK WITH CONTRAST TECHNIQUE: Multidetector CT imaging of the neck was performed using the standard protocol following the bolus administration of intravenous contrast. RADIATION DOSE REDUCTION: This exam was performed according to the departmental dose-optimization program which includes automated exposure control, adjustment of the mA and/or kV according to patient size and/or use of iterative reconstruction technique. CONTRAST:  4mL OMNIPAQUE IOHEXOL 300 MG/ML  SOLN COMPARISON:  Head CT 01/13/2021. FINDINGS: Pharynx and larynx: Poor dentition with multiple absent and carious teeth and multifocal periapical lucencies. There may be subtle perimandibular soft tissue swelling. Additionally, there is a punctate focus of gas within the floor of mouth along the inner margin of the left mandibular body (series 6, image 34). This finding is nonspecific. However, a small tiny soft tissue abscess at this site cannot be excluded. No appreciable swelling or discrete mass elsewhere within the oral cavity, pharynx or larynx. Salivary glands: No inflammation, mass, or stone. Thyroid: 2.4 cm nodule within the left thyroid lobe. Lymph nodes: Ovoid  soft tissue focus measuring 8 mm in short axis posterior to the right thyroid lobe in the right paratracheal region (series 2, image 89). This finding is nonspecific, but may reflect exophytic thyroid tissue, a nonspecific prominent lymph node or a parathyroid adenoma. Elsewhere within the neck, no pathologically enlarged cervical chain lymph nodes are identified. Vascular: The major vascular structures of the neck are patent. Atherosclerotic plaque within the visualized aortic arch and carotid arteries. Limited intracranial: No evidence of acute intracranial abnormality within the field of view. Visualized orbits: No orbital mass or acute orbital finding. Mastoids and visualized paranasal  sinuses: Trace mucosal thickening within the bilateral ethmoid and maxillary sinuses. No significant mastoid effusion. Skeleton: Cervical spondylosis. No acute bony abnormality or aggressive osseous lesion. Upper chest: No consolidation within the imaged lung apices. IMPRESSION: Poor dentition with multiple absent and carious teeth, and multifocal periapical lucencies. There may be subtle perimandibular soft tissue swelling/cellulitis. Additionally, there is a punctate focus of gas within the floor of mouth, along the inner margin of the left mandibular body. Although nonspecific, a tiny soft tissue abscess at this site is difficult to exclude. 2.4 cm nodule within the left thyroid lobe. Additionally, there is an ovoid soft tissue focus measuring 8 mm in short axis posterior to the right thyroid lobe (in the right paratracheal region). This finding is nonspecific, but may reflect exophytic thyroid tissue, a nonspecific prominent lymph node or a parathyroid adenoma. A non-emergent thyroid ultrasound is recommended for initial further characterization. Minimal mucosal thickening within the bilateral ethmoid and maxillary sinuses. Aortic Atherosclerosis (ICD10-I70.0). Electronically Signed   By: Kellie Simmering D.O.   On: 08/11/2021 13:38   DG Chest Port 1 View  Result Date: 08/11/2021 CLINICAL DATA:  COVID infection, facial swelling, dyspnea EXAM: PORTABLE CHEST 1 VIEW COMPARISON:  01/13/2021 FINDINGS: The heart size and mediastinal contours are within normal limits. Both lungs are clear. The visualized skeletal structures are unremarkable. IMPRESSION: No active disease. Electronically Signed   By: Fidela Salisbury M.D.   On: 08/11/2021 23:13      Subjective: Seen and examined the day of discharge.  Stable no distress.  Tolerating p.o. intake.  Had bowel movement.  Stable for discharge home.  Discharge Exam: Vitals:   08/16/21 0446 08/16/21 0732  BP: 116/61 (!) 109/54  Pulse: 71 68  Resp: 16 18  Temp: 98 F  (36.7 C) 97.7 F (36.5 C)  SpO2: 98% 95%   Vitals:   08/15/21 1555 08/15/21 2015 08/16/21 0446 08/16/21 0732  BP: 122/62 108/72 116/61 (!) 109/54  Pulse: 73 70 71 68  Resp: 18 16 16 18   Temp: 97.6 F (36.4 C) 97.9 F (36.6 C) 98 F (36.7 C) 97.7 F (36.5 C)  TempSrc: Oral  Oral Oral  SpO2: 96% 92% 98% 95%  Weight:      Height:        General: Pt is alert, awake, not in acute distress Cardiovascular: RRR, S1/S2 +, no rubs, no gallops Respiratory: CTA bilaterally, no wheezing, no rhonchi Abdominal: Soft, NT, ND, bowel sounds + Extremities: no edema, no cyanosis    The results of significant diagnostics from this hospitalization (including imaging, microbiology, ancillary and laboratory) are listed below for reference.     Microbiology: Recent Results (from the past 240 hour(s))  Resp Panel by RT-PCR (Flu A&B, Covid) Nasopharyngeal Swab     Status: Abnormal   Collection Time: 08/11/21 12:00 PM   Specimen: Nasopharyngeal Swab; Nasopharyngeal(NP) swabs in vial transport medium  Result Value Ref Range Status   SARS Coronavirus 2 by RT PCR POSITIVE (A) NEGATIVE Final    Comment: (NOTE) SARS-CoV-2 target nucleic acids are DETECTED.  The SARS-CoV-2 RNA is generally detectable in upper respiratory specimens during the acute phase of infection. Positive results are indicative of the presence of the identified virus, but do not rule out bacterial infection or co-infection with other pathogens not detected by the test. Clinical correlation with patient history and other diagnostic information is necessary to determine patient infection status. The expected result is Negative.  Fact Sheet for Patients: EntrepreneurPulse.com.au  Fact Sheet for Healthcare Providers: IncredibleEmployment.be  This test is not yet approved or cleared by the [place] FDA and  has been authorized for detection and/or diagnosis of SARS-CoV-2 by FDA under an  Emergency Use Authorization (EUA).  This EUA will remain in effect (meaning this test can be used) for the duration of  the COVID-19 declaration under Section 564(b)(1) of the A ct, 21 U.S.C. section 360bbb-3(b)(1), unless the authorization is terminated or revoked sooner.     Influenza A by PCR NEGATIVE NEGATIVE Final   Influenza B by PCR NEGATIVE NEGATIVE Final    Comment: (NOTE) The Xpert Xpress SARS-CoV-2/FLU/RSV plus assay is intended as an aid in the diagnosis of influenza from Nasopharyngeal swab specimens and should not be used as a sole basis for treatment. Nasal washings and aspirates are unacceptable for Xpert Xpress SARS-CoV-2/FLU/RSV testing.  Fact Sheet for Patients: EntrepreneurPulse.com.au  Fact Sheet for Healthcare Providers: IncredibleEmployment.be  This test is not yet approved or cleared by the [place] FDA and has been authorized for detection and/or diagnosis of SARS-CoV-2 by FDA under an Emergency Use Authorization (EUA). This EUA will remain in effect (meaning this test can be used) for the duration of the COVID-19 declaration under Section 564(b)(1) of the Act, 21 U.S.C. section 360bbb-3(b)(1), unless the authorization is terminated or revoked.  Performed at [facility], [address] [place], [place] [address]   Group A Strep by PCR [facility] Only)     Status: None   Collection Time: 08/11/21 12:00 PM   Specimen: Nasopharyngeal Swab; Sterile Swab  Result Value Ref Range Status   Group A Strep by PCR NOT DETECTED NOT DETECTED Final    Comment: Performed at [facility], [address] [place], [place] [address]     Labs: BNP (last 3 results) Recent Labs    01/13/21 0746  BNP 97.9   Basic Metabolic Panel: Recent Labs  Lab 08/11/21 2047 08/13/21 0521 08/14/21 0409 08/15/21 0412 08/16/21 0501  NA 137 138 137 138 138  K 4.4 4.2 4.0 4.2 4.3  CL 105 107 108 106 107  CO2 24 23 23  24 24   GLUCOSE 189* 139* 143* 117* 191*  BUN 21 31* 32* 30* 30*  CREATININE 1.06* 1.55* 1.39* 1.21* 1.25*  CALCIUM 9.3 8.6* 8.5* 8.5* 8.5*  MG  --  1.9 2.0 2.1 2.3   Liver Function Tests: Recent Labs  Lab 08/11/21 1200  AST 24  ALT 20  ALKPHOS 112  BILITOT 0.6  PROT 7.7  ALBUMIN 3.8   No results for input(s): LIPASE, AMYLASE in the last 168 hours. No results for input(s): AMMONIA in the last 168 hours. CBC: Recent Labs  Lab 08/11/21 2047 08/13/21 0521 08/14/21 0409 08/15/21 0412 08/16/21 0501  WBC 8.5 5.9 5.1 5.2 5.0  NEUTROABS 5.8  --   --   --   --   HGB  12.9 11.3* 10.5* 11.1* 11.4*  HCT 39.1 34.8* 32.2* 34.1* 35.2*  MCV 84.3 85.9 87.3 86.1 86.7  PLT 181 159 165 184 210   Cardiac Enzymes: No results for input(s): CKTOTAL, CKMB, CKMBINDEX, TROPONINI in the last 168 hours. BNP: Invalid input(s): POCBNP CBG: Recent Labs  Lab 08/11/21 2327 08/12/21 0807 08/12/21 1122 08/12/21 1623  GLUCAP 175* 197* 144* 125*   D-Dimer No results for input(s): DDIMER in the last 72 hours. Hgb A1c No results for input(s): HGBA1C in the last 72 hours. Lipid Profile No results for input(s): CHOL, HDL, LDLCALC, TRIG, CHOLHDL, LDLDIRECT in the last 72 hours. Thyroid function studies No results for input(s): TSH, T4TOTAL, T3FREE, THYROIDAB in the last 72 hours.  Invalid input(s): FREET3 Anemia work up No results for input(s): VITAMINB12, FOLATE, FERRITIN, TIBC, IRON, RETICCTPCT in the last 72 hours. Urinalysis    Component Value Date/Time   COLORURINE YELLOW (A) 01/13/2021 0655   APPEARANCEUR HAZY (A) 01/13/2021 0655   APPEARANCEUR Cloudy 02/19/2013 2252   LABSPEC 1.020 01/13/2021 0655   LABSPEC 1.023 02/19/2013 2252   PHURINE 5.0 01/13/2021 0655   GLUCOSEU >=500 (A) 01/13/2021 0655   GLUCOSEU >=500 02/19/2013 2252   HGBUR SMALL (A) 01/13/2021 0655   BILIRUBINUR NEGATIVE 01/13/2021 0655   BILIRUBINUR Negative 02/19/2013 2252   KETONESUR 5 (A) 01/13/2021 0655    PROTEINUR NEGATIVE 01/13/2021 0655   NITRITE NEGATIVE 01/13/2021 0655   LEUKOCYTESUR LARGE (A) 01/13/2021 0655   LEUKOCYTESUR 3+ 02/19/2013 2252   Sepsis Labs Invalid input(s): PROCALCITONIN,  WBC,  LACTICIDVEN Microbiology Recent Results (from the past 240 hour(s))  Resp Panel by RT-PCR (Flu A&B, Covid) Nasopharyngeal Swab     Status: Abnormal   Collection Time: 08/11/21 12:00 PM   Specimen: Nasopharyngeal Swab; Nasopharyngeal(NP) swabs in vial transport medium  Result Value Ref Range Status   SARS Coronavirus 2 by RT PCR POSITIVE (A) NEGATIVE Final    Comment: (NOTE) SARS-CoV-2 target nucleic acids are DETECTED.  The SARS-CoV-2 RNA is generally detectable in upper respiratory specimens during the acute phase of infection. Positive results are indicative of the presence of the identified virus, but do not rule out bacterial infection or co-infection with other pathogens not detected by the test. Clinical correlation with patient history and other diagnostic information is necessary to determine patient infection status. The expected result is Negative.  Fact Sheet for Patients: EntrepreneurPulse.com.au  Fact Sheet for Healthcare Providers: IncredibleEmployment.be  This test is not yet approved or cleared by the [place] FDA and  has been authorized for detection and/or diagnosis of SARS-CoV-2 by FDA under an Emergency Use Authorization (EUA).  This EUA will remain in effect (meaning this test can be used) for the duration of  the COVID-19 declaration under Section 564(b)(1) of the A ct, 21 U.S.C. section 360bbb-3(b)(1), unless the authorization is terminated or revoked sooner.     Influenza A by PCR NEGATIVE NEGATIVE Final   Influenza B by PCR NEGATIVE NEGATIVE Final    Comment: (NOTE) The Xpert Xpress SARS-CoV-2/FLU/RSV plus assay is intended as an aid in the diagnosis of influenza from Nasopharyngeal swab specimens and should not  be used as a sole basis for treatment. Nasal washings and aspirates are unacceptable for Xpert Xpress SARS-CoV-2/FLU/RSV testing.  Fact Sheet for Patients: EntrepreneurPulse.com.au  Fact Sheet for Healthcare Providers: IncredibleEmployment.be  This test is not yet approved or cleared by the [place] FDA and has been authorized for detection and/or diagnosis of SARS-CoV-2 by FDA under an Emergency  Use Authorization (EUA). This EUA will remain in effect (meaning this test can be used) for the duration of the COVID-19 declaration under Section 564(b)(1) of the Act, 21 U.S.C. section 360bbb-3(b)(1), unless the authorization is terminated or revoked.  Performed at [facility], [place], [place] [address]   Group A Strep by PCR [facility] Only)     Status: None   Collection Time: 08/11/21 12:00 PM   Specimen: Nasopharyngeal Swab; Sterile Swab  Result Value Ref Range Status   Group A Strep by PCR NOT DETECTED NOT DETECTED Final    Comment: Performed at [facility], [address] [place], [place] [address]     Time coordinating discharge: Over 30 minutes  SIGNED:   Sidney Ace, MD  [facility] 08/16/2021, 2:28 PM Pager   If 7PM-7AM, please contact night-coverage

## 2021-08-23 ENCOUNTER — Ambulatory Visit: Admission: RE | Admit: 2021-08-23 | Payer: BC Managed Care – PPO | Source: Home / Self Care

## 2021-08-23 ENCOUNTER — Encounter: Admission: RE | Payer: Self-pay | Source: Home / Self Care

## 2021-08-23 SURGERY — COLONOSCOPY WITH PROPOFOL
Anesthesia: General

## 2021-09-10 ENCOUNTER — Encounter (HOSPITAL_COMMUNITY): Payer: Self-pay | Admitting: Radiology

## 2021-10-16 ENCOUNTER — Other Ambulatory Visit: Payer: Self-pay

## 2021-10-16 ENCOUNTER — Emergency Department
Admission: EM | Admit: 2021-10-16 | Discharge: 2021-10-16 | Disposition: A | Discharge status: Home / Self Care | Payer: BC Managed Care – PPO | Attending: Emergency Medicine | Admitting: Emergency Medicine

## 2021-10-16 DIAGNOSIS — E1165 Type 2 diabetes mellitus with hyperglycemia: Secondary | ICD-10-CM | POA: Diagnosis not present

## 2021-10-16 DIAGNOSIS — K91840 Postprocedural hemorrhage and hematoma of a digestive system organ or structure following a digestive system procedure: Secondary | ICD-10-CM | POA: Diagnosis present

## 2021-10-16 DIAGNOSIS — K068 Other specified disorders of gingiva and edentulous alveolar ridge: Secondary | ICD-10-CM

## 2021-10-16 DIAGNOSIS — R42 Dizziness and giddiness: Secondary | ICD-10-CM | POA: Diagnosis not present

## 2021-10-16 LAB — CBC WITH DIFFERENTIAL/PLATELET
Abs Immature Granulocytes: 0.03 10*3/uL (ref 0.00–0.07)
Basophils Absolute: 0 10*3/uL (ref 0.0–0.1)
Basophils Relative: 0 %
Eosinophils Absolute: 0.1 10*3/uL (ref 0.0–0.5)
Eosinophils Relative: 2 %
HCT: 33.4 % — ABNORMAL LOW (ref 36.0–46.0)
Hemoglobin: 10.9 g/dL — ABNORMAL LOW (ref 12.0–15.0)
Immature Granulocytes: 1 %
Lymphocytes Relative: 36 %
Lymphs Abs: 2.1 10*3/uL (ref 0.7–4.0)
MCH: 28.5 pg (ref 26.0–34.0)
MCHC: 32.6 g/dL (ref 30.0–36.0)
MCV: 87.2 fL (ref 80.0–100.0)
Monocytes Absolute: 0.5 10*3/uL (ref 0.1–1.0)
Monocytes Relative: 8 %
Neutro Abs: 3.1 10*3/uL (ref 1.7–7.7)
Neutrophils Relative %: 53 %
Platelets: 157 10*3/uL (ref 150–400)
RBC: 3.83 MIL/uL — ABNORMAL LOW (ref 3.87–5.11)
RDW: 13.9 % (ref 11.5–15.5)
WBC: 5.8 10*3/uL (ref 4.0–10.5)
nRBC: 0 % (ref 0.0–0.2)

## 2021-10-16 LAB — CBG MONITORING, ED: Glucose-Capillary: 300 mg/dL — ABNORMAL HIGH (ref 70–99)

## 2021-10-16 LAB — TYPE AND SCREEN
ABO/RH(D): A POS
Antibody Screen: NEGATIVE

## 2021-10-16 LAB — PROTIME-INR
INR: 1.2 (ref 0.8–1.2)
Prothrombin Time: 15 seconds (ref 11.4–15.2)

## 2021-10-16 MED ORDER — TRANEXAMIC ACID FOR EPISTAXIS
500.0000 mg | Freq: Once | TOPICAL | Status: AC
  Administered 2021-10-16: 500 mg via TOPICAL
  Filled 2021-10-16: qty 10

## 2021-10-16 NOTE — ED Provider Notes (Signed)
? ?[facility] ?Provider Note ? ? Event Date/Time  ? First MD Initiated Contact with Patient 10/16/21 [phone]   ?  (approximate) ?History  ?No chief complaint on file. ? ?HPI ?[name] is a 66 y.o. female with stated past medical history of type 2 diabetes who presents after a tooth extraction approximately 40s prior to arrival via EMS with complaints of oral bleeding.  Patient states that she awoke this evening with a significant mount of blood in her mouth as well as around her house that was noticed when EMS arrived.  Patient states that she had some small amount of bleeding earlier today that resolved spontaneously.  Patient denies any blood thinner use or antiplatelet use.  Patient does endorse a headache and mild lightheadedness ?Physical Exam  ?Triage Vital Signs: ?ED Triage Vitals  ?Enc Vitals Group  ?   BP 10/16/21 0418 (!) 157/83  ?   Pulse Rate 10/16/21 0418 (!) 103  ?   Resp 10/16/21 0418 20  ?   Temp --   ?   Temp src --   ?   SpO2 10/16/21 0418 100 %  ?   Weight --   ?   Height --   ?   Head Circumference --   ?   Peak Flow --   ?   Pain Score 10/16/21 0425 0  ?   Pain Loc --   ?   Pain Edu? --   ?   Excl. in [place]? --   ? ?Most recent vital signs: ?Vitals:  ? 10/16/21 0530 10/16/21 0600  ?BP: (!) 111/56 110/60  ?Pulse: 87 84  ?Resp: 16 12  ?Temp:    ?SpO2: 96% 98%  ? ?General: Awake, oriented x4. ?CV:  Good peripheral perfusion.  ?Resp:  Normal effort.  ?Abd:  No distention.  ?Other:  Elderly Caucasian female laying in bed in no distress.  Actively spitting out bright red blood.  A dentulous with oozing bleeding from the left maxillary molars ?ED Results / Procedures / Treatments  ?Labs ?(all labs ordered are listed, but only abnormal results are displayed) ?Labs Reviewed  ?CBC WITH DIFFERENTIAL/PLATELET - Abnormal; Notable for the following components:  ?    Result Value  ? RBC 3.83 (*)   ? Hemoglobin 10.9 (*)   ? HCT 33.4 (*)   ? All other components within normal limits  ?CBG  MONITORING, ED - Abnormal; Notable for the following components:  ? Glucose-Capillary 300 (*)   ? All other components within normal limits  ?PROTIME-INR  ?TYPE AND SCREEN  ? ?PROCEDURES: ?Critical Care performed: No ?Procedures ?MEDICATIONS ORDERED IN ED: ?Medications  ?tranexamic acid (CYKLOKAPRON) 1000 MG/10ML topical solution 500 mg (500 mg Topical Given 10/16/21 0426)  ? ?IMPRESSION / MDM / ASSESSMENT AND PLAN / ED COURSE  ?I reviewed the triage vital signs and the nursing notes. ?             ?               ?Differential diagnosis includes, but is not limited to, DIC, thrombocytopenia, gingivitis ?The patient is on the cardiac monitor to evaluate for evidence of arrhythmia and/or significant heart rate changes. ?Patient is a 66 year old female with the above-stated past medical history who presents after dental extraction complaining of oral bleeding.  Patient has significant amount of bleeding and clots in the mouth that were extracted after arrival.  Despite suction and thorough examination, the source of this bleeding  could not be identified.  Patient stated that she felt as though she had continued bleeding in her mouth and therefore TXA impregnated gauze pledgets were placed overlying bilateral molars.  After presence of these pledgets for approximately 10 minutes, patient's gums were hemostatic.  Patient was monitored for any rebleeding that did not occur and patient felt comfortable with discharge at this time.  Patient encouraged to reconnect with her dental surgeon to inform them of her experience here this evening as well as inquire about any further care that she may need. ? ?Dispo: Discharge home with dental follow-up ? ?  ?FINAL CLINICAL IMPRESSION(S) / ED DIAGNOSES  ? ?Final diagnoses:  ?Gingival bleeding  ? ?Rx / DC Orders  ? ?ED Discharge Orders   ? ? None  ? ?  ? ?Note:  This document was prepared using Dragon voice recognition software and may include unintentional dictation errors. ?   ?Naaman Plummer, MD ?10/16/21 [phone] ? ?

## 2021-10-16 NOTE — ED Notes (Signed)
TXA soaked gauze removed by Dr. Cheri Fowler with no bleeding noted. Patient requested to go to bathroom.  Ambulated to bathroom with standby assist. Denied dizziness, reports headache. Continent of urine. Assisted back to bed. Patient belched and small blood clot brought up. Patient reports she was unsure if she was bleeding again. Dr. Cheri Fowler at bedside. No active bleeding observed at this time. Family at bedside. No distress noted.  ?

## 2021-10-16 NOTE — ED Triage Notes (Signed)
Arrived via EMS from home. Patient reports she had dental procedure last week with teeth extraction. States she woke up to bleeding from mouth. EMS report heavy bleeding on arrival. Patient reports generalized weakness and dizziness. Alert, active bleeding from mouth with clots. Resp even, unlabored on RA.  ?

## 2021-10-22 ENCOUNTER — Emergency Department: Payer: BC Managed Care – PPO

## 2021-10-22 ENCOUNTER — Other Ambulatory Visit: Payer: Self-pay

## 2021-10-22 ENCOUNTER — Emergency Department
Admission: EM | Admit: 2021-10-22 | Discharge: 2021-10-22 | Disposition: A | Discharge status: Home / Self Care | Payer: BC Managed Care – PPO | Attending: Emergency Medicine | Admitting: Emergency Medicine

## 2021-10-22 DIAGNOSIS — K0889 Other specified disorders of teeth and supporting structures: Secondary | ICD-10-CM | POA: Insufficient documentation

## 2021-10-22 DIAGNOSIS — Z20822 Contact with and (suspected) exposure to covid-19: Secondary | ICD-10-CM | POA: Insufficient documentation

## 2021-10-22 DIAGNOSIS — R5381 Other malaise: Secondary | ICD-10-CM

## 2021-10-22 DIAGNOSIS — R531 Weakness: Secondary | ICD-10-CM | POA: Diagnosis present

## 2021-10-22 DIAGNOSIS — E860 Dehydration: Secondary | ICD-10-CM | POA: Insufficient documentation

## 2021-10-22 LAB — COMPREHENSIVE METABOLIC PANEL
ALT: 25 U/L (ref 0–44)
AST: 33 U/L (ref 15–41)
Albumin: 4 g/dL (ref 3.5–5.0)
Alkaline Phosphatase: 116 U/L (ref 38–126)
Anion gap: 9 (ref 5–15)
BUN: 23 mg/dL (ref 8–23)
CO2: 22 mmol/L (ref 22–32)
Calcium: 8.5 mg/dL — ABNORMAL LOW (ref 8.9–10.3)
Chloride: 109 mmol/L (ref 98–111)
Creatinine, Ser: 1.41 mg/dL — ABNORMAL HIGH (ref 0.44–1.00)
GFR, Estimated: 42 mL/min — ABNORMAL LOW (ref >60)
Glucose, Bld: 113 mg/dL — ABNORMAL HIGH (ref 70–99)
Potassium: 4.1 mmol/L (ref 3.5–5.1)
Sodium: 140 mmol/L (ref 135–145)
Total Bilirubin: 0.8 mg/dL (ref 0.3–1.2)
Total Protein: 7.7 g/dL (ref 6.5–8.1)

## 2021-10-22 LAB — URINALYSIS, ROUTINE W REFLEX MICROSCOPIC
Bacteria, UA: NONE SEEN
Bilirubin Urine: NEGATIVE
Glucose, UA: >=500 mg/dL — AB
Hgb urine dipstick: NEGATIVE
Ketones, ur: NEGATIVE mg/dL
Leukocytes,Ua: NEGATIVE
Nitrite: NEGATIVE
Protein, ur: NEGATIVE mg/dL
Specific Gravity, Urine: 1.02 (ref 1.005–1.030)
pH: 5 (ref 5.0–8.0)

## 2021-10-22 LAB — CBC
HCT: 34.2 % — ABNORMAL LOW (ref 36.0–46.0)
Hemoglobin: 11.1 g/dL — ABNORMAL LOW (ref 12.0–15.0)
MCH: 28 pg (ref 26.0–34.0)
MCHC: 32.5 g/dL (ref 30.0–36.0)
MCV: 86.1 fL (ref 80.0–100.0)
Platelets: 204 10*3/uL (ref 150–400)
RBC: 3.97 MIL/uL (ref 3.87–5.11)
RDW: 14.1 % (ref 11.5–15.5)
WBC: 5.7 10*3/uL (ref 4.0–10.5)
nRBC: 0 % (ref 0.0–0.2)

## 2021-10-22 LAB — LIPASE, BLOOD: Lipase: 64 U/L — ABNORMAL HIGH (ref 11–51)

## 2021-10-22 LAB — RESP PANEL BY RT-PCR (FLU A&B, COVID) ARPGX2
Influenza A by PCR: NEGATIVE
Influenza B by PCR: NEGATIVE
SARS Coronavirus 2 by RT PCR: NEGATIVE

## 2021-10-22 MED ORDER — SODIUM CHLORIDE 0.9 % IV BOLUS
1000.0000 mL | Freq: Once | INTRAVENOUS | Status: AC
  Administered 2021-10-22: 1000 mL via INTRAVENOUS

## 2021-10-22 MED ORDER — ONDANSETRON 4 MG PO TBDP
4.0000 mg | ORAL_TABLET | Freq: Once | ORAL | Status: AC
  Administered 2021-10-22: 4 mg via ORAL
  Filled 2021-10-22: qty 1

## 2021-10-22 NOTE — ED Provider Notes (Signed)
? ?[facility] ?Provider Note ? ?Patient Contact: 5:45 PM (approximate) ? ? ?History  ? ?Emesis and Dental Pain ? ? ?HPI ? ?[name] is a 66 y.o. female who presents the emergency department complaining of weakness, malaise, nausea, dental pain.  Patient had teeth extracted roughly 10 days ago.  She was seen once in the department after extraction due to bleeding gums.  This resolved with topical TXA.  Patient states that since then she has felt off, confused, weak.  Weakness appears to be generalized and does not appear to be localized unilaterally.  She has had a headache but denies this being the worst headache of her life.  She states that she feels confused but has had no slurred speech or profound weakness.  No reported fevers, chills.  She states that she still having dental pain from her extraction sites though this has not been increasing.  Patient endorses nausea but no emesis.  No urinary changes, diarrhea or constipation.  Patient denies any chest pain, shortness of breath.  Patient reports that she has recently started back on her diabetes medications.  She reportedly takes metformin and insulin.  She states that her blood sugars have been very "sporadic" with checking after her recent dental surgery.  On her last encounter her blood sugar was 300, today is 113.  No increased thirst. ?  ? ? ?Physical Exam  ? ?Triage Vital Signs: ?ED Triage Vitals  ?Enc Vitals Group  ?   BP 10/22/21 1436 110/64  ?   Pulse Rate 10/22/21 1436 87  ?   Resp 10/22/21 1436 16  ?   Temp 10/22/21 1436 98.5 ?F (36.9 ?C)  ?   Temp Source 10/22/21 1436 Oral  ?   SpO2 10/22/21 1436 98 %  ?   Weight 10/22/21 1429 140 lb (63.5 kg)  ?   Height 10/22/21 1429 '5\' 7"'$  (1.702 m)  ?   Head Circumference --   ?   Peak Flow --   ?   Pain Score --   ?   Pain Loc --   ?   Pain Edu? --   ?   Excl. in [place]? --   ? ? ?Most recent vital signs: ?Vitals:  ? 10/22/21 1436  ?BP: 110/64  ?Pulse: 87  ?Resp: 16  ?Temp: 98.5 ?F  (36.9 ?C)  ?SpO2: 98%  ? ? ? ?General: Alert and in no acute distress. ?ENT: ?     Ears:  ?     Nose: No congestion/rhinnorhea. ?     Mouth/Throat: Mucous membranes are moist.  Extraction sites with good healing with no evidence of dry socket or other signs of infection. ?Neck: No stridor. No cervical spine tenderness to palpation. ?Hematological/Lymphatic/Immunilogical: No cervical lymphadenopathy. ?Cardiovascular:  Good peripheral perfusion ?Respiratory: Normal respiratory effort without tachypnea or retractions. Lungs CTAB. ?Gastrointestinal: Bowel sounds ?4 quadrants. Soft and nontender to palpation. No guarding or rigidity. No palpable masses. No distention. No CVA tenderness. ?Musculoskeletal: Full range of motion to all extremities.  ?Neurologic:  No gross focal neurologic deficits are appreciated.  Cranial nerves II through XII grossly intact. ?Skin:   No rash noted ?Other: ? ? ?ED Results / Procedures / Treatments  ? ?Labs ?(all labs ordered are listed, but only abnormal results are displayed) ?Labs Reviewed  ?LIPASE, BLOOD - Abnormal; Notable for the following components:  ?    Result Value  ? Lipase 64 (*)   ? All other components within normal limits  ?  COMPREHENSIVE METABOLIC PANEL - Abnormal; Notable for the following components:  ? Glucose, Bld 113 (*)   ? Creatinine, Ser 1.41 (*)   ? Calcium 8.5 (*)   ? GFR, Estimated 42 (*)   ? All other components within normal limits  ?CBC - Abnormal; Notable for the following components:  ? Hemoglobin 11.1 (*)   ? HCT 34.2 (*)   ? All other components within normal limits  ?URINALYSIS, ROUTINE W REFLEX MICROSCOPIC - Abnormal; Notable for the following components:  ? Color, Urine YELLOW (*)   ? APPearance CLEAR (*)   ? Glucose, UA >=500 (*)   ? All other components within normal limits  ?RESP PANEL BY RT-PCR (FLU A&B, COVID) ARPGX2  ? ? ? ?EKG ? ? ? ?RADIOLOGY ? ?I personally viewed and evaluated these images as part of my medical decision making, as well as  reviewing the written report by the radiologist. ? ?ED Provider Interpretation: No acute cardiopulmonary findings on chest x-ray.  CT scan reveals no acute intracranial abnormality. ? ?DG Chest 2 View ? ?Result Date: 10/22/2021 ?CLINICAL DATA:  Weakness, nausea EXAM: CHEST - 2 VIEW COMPARISON:  08/11/2021 FINDINGS: The heart size and mediastinal contours are within normal limits. Both lungs are clear. The visualized skeletal structures are unremarkable. IMPRESSION: No active cardiopulmonary disease. Electronically Signed   By: Randa Ngo M.D.   On: 10/22/2021 18:17  ? ?CT Head Wo Contrast ? ?Result Date: 10/22/2021 ?CLINICAL DATA:  Mental status change, unknown cause weakness, malaise, confusion EXAM: CT HEAD WITHOUT CONTRAST TECHNIQUE: Contiguous axial images were obtained from the base of the skull through the vertex without intravenous contrast. RADIATION DOSE REDUCTION: This exam was performed according to the departmental dose-optimization program which includes automated exposure control, adjustment of the mA and/or kV according to patient size and/or use of iterative reconstruction technique. COMPARISON:  01/13/2021 FINDINGS: Brain: There is atrophy and chronic small vessel disease changes. No acute intracranial abnormality. Specifically, no hemorrhage, hydrocephalus, mass lesion, acute infarction, or significant intracranial injury. Vascular: No hyperdense vessel or unexpected calcification. Skull: No acute calvarial abnormality. Sinuses/Orbits: No acute findings Other: None IMPRESSION: Atrophy, chronic microvascular disease. No acute intracranial abnormality. Electronically Signed   By: Rolm Baptise M.D.   On: 10/22/2021 18:46   ? ?PROCEDURES: ? ?Critical Care performed: No ? ?Procedures ? ? ?MEDICATIONS ORDERED IN ED: ?Medications  ?ondansetron (ZOFRAN-ODT) disintegrating tablet 4 mg (4 mg Oral Given 10/22/21 1434)  ?sodium chloride 0.9 % bolus 1,000 mL (0 mLs Intravenous Stopped 10/22/21 2054)   ? ? ? ?IMPRESSION / MDM / ASSESSMENT AND PLAN / ED COURSE  ?I reviewed the triage vital signs and the nursing notes. ?             ?               ? ?Differential diagnosis includes, but is not limited to, malaise, weakness, infectious process such as pneumonia, UTI, CVA, diabetic issue such as DKA, hyperglycemia, hypoglycemia, dehydration ? ? ?Patient's diagnosis is consistent with malaise, mild dehydration.  Patient presented to the emergency department with multiple vague/generalized complaints.  Patient states that this started after having dental work with dental extractions performed.  Patient has not been able to eat her normal diet, has also started taking her diabetes medications again.  Patient states that she went through a period of time where she did not take her medications as she thought she had improved with her diabetes as her blood work was  improving.  Overall exam was reassuring with patient being neurologically intact.  She states that she has felt confused, dizzy, lightheaded, weak.  As such patient had imaging, labs performed.  Imaging is reassuring with no acute findings intracranially or on chest x-ray.  Labs are reassuring.  Her creatinine is 1.3 which is in the higher end of normal for the patient.  She states that she has not been eating and drinking as much, as well as restarting her medications for diabetes.  I suspect that this is a combination of multiple symptoms of recent dental work, changes in eating habits, restarting medication.  Overall work-up was reassuring and patient is stable for discharge.  Patient states that she does believe that it is a multitude of things together creating her symptoms and feels comfortable at this time going home.  Concerning signs and symptoms are discussed with the patient.  Follow-up primary care as needed..  Patient is given ED precautions to return to the ED for any worsening or new symptoms. ? ? ? ?  ? ? ?FINAL CLINICAL IMPRESSION(S) / ED  DIAGNOSES  ? ?Final diagnoses:  ?Malaise  ?Mild dehydration  ? ? ? ?Rx / DC Orders  ? ?ED Discharge Orders   ? ? None  ? ?  ? ? ? ?Note:  This document was prepared using Dragon voice recognition software and may include unint

## 2021-10-22 NOTE — ED Triage Notes (Signed)
Pt comes into the ED via EMS from work,, states around [place] started feeling confused with a generalized HA with nausea, states she had some teeth extracted a couple weeks ago and hasnt felt well since, states she has been hot and cold, states her sugar has been up and down, still having pain and gum swelling since the extractions ? ? ?160/77 ?HR80's ?99%RA ?[id number] ? ?

## 2022-01-01 ENCOUNTER — Ambulatory Visit
Admission: RE | Admit: 2022-01-01 | Discharge: 2022-01-01 | Disposition: A | Discharge status: Home / Self Care | Payer: BC Managed Care – PPO | Source: Ambulatory Visit | Attending: Physical Medicine & Rehabilitation | Admitting: Physical Medicine & Rehabilitation

## 2022-01-01 ENCOUNTER — Other Ambulatory Visit: Payer: Self-pay | Admitting: Physical Medicine & Rehabilitation

## 2022-01-01 DIAGNOSIS — R32 Unspecified urinary incontinence: Secondary | ICD-10-CM | POA: Insufficient documentation

## 2022-01-01 DIAGNOSIS — M5442 Lumbago with sciatica, left side: Secondary | ICD-10-CM | POA: Diagnosis present

## 2022-01-01 DIAGNOSIS — G8929 Other chronic pain: Secondary | ICD-10-CM

## 2022-01-01 DIAGNOSIS — R159 Full incontinence of feces: Secondary | ICD-10-CM

## 2022-01-01 DIAGNOSIS — M5441 Lumbago with sciatica, right side: Secondary | ICD-10-CM | POA: Diagnosis present

## 2022-01-01 DIAGNOSIS — M546 Pain in thoracic spine: Secondary | ICD-10-CM | POA: Diagnosis present

## 2022-01-08 ENCOUNTER — Other Ambulatory Visit: Payer: Self-pay | Admitting: Family Medicine

## 2022-01-08 DIAGNOSIS — E041 Nontoxic single thyroid nodule: Secondary | ICD-10-CM

## 2022-01-15 ENCOUNTER — Other Ambulatory Visit: Payer: BC Managed Care – PPO

## 2022-03-20 ENCOUNTER — Emergency Department: Payer: BC Managed Care – PPO

## 2022-03-20 ENCOUNTER — Encounter: Payer: Self-pay | Admitting: *Deleted

## 2022-03-20 ENCOUNTER — Other Ambulatory Visit: Payer: Self-pay

## 2022-03-20 DIAGNOSIS — S42032A Displaced fracture of lateral end of left clavicle, initial encounter for closed fracture: Secondary | ICD-10-CM | POA: Insufficient documentation

## 2022-03-20 DIAGNOSIS — M549 Dorsalgia, unspecified: Secondary | ICD-10-CM | POA: Diagnosis not present

## 2022-03-20 DIAGNOSIS — E039 Hypothyroidism, unspecified: Secondary | ICD-10-CM | POA: Diagnosis not present

## 2022-03-20 DIAGNOSIS — S0990XA Unspecified injury of head, initial encounter: Secondary | ICD-10-CM | POA: Diagnosis not present

## 2022-03-20 DIAGNOSIS — N189 Chronic kidney disease, unspecified: Secondary | ICD-10-CM | POA: Insufficient documentation

## 2022-03-20 DIAGNOSIS — Z79899 Other long term (current) drug therapy: Secondary | ICD-10-CM | POA: Insufficient documentation

## 2022-03-20 DIAGNOSIS — Z7984 Long term (current) use of oral hypoglycemic drugs: Secondary | ICD-10-CM | POA: Insufficient documentation

## 2022-03-20 DIAGNOSIS — W06XXXA Fall from bed, initial encounter: Secondary | ICD-10-CM | POA: Insufficient documentation

## 2022-03-20 DIAGNOSIS — M79671 Pain in right foot: Secondary | ICD-10-CM | POA: Diagnosis not present

## 2022-03-20 DIAGNOSIS — Z794 Long term (current) use of insulin: Secondary | ICD-10-CM | POA: Insufficient documentation

## 2022-03-20 DIAGNOSIS — E119 Type 2 diabetes mellitus without complications: Secondary | ICD-10-CM | POA: Insufficient documentation

## 2022-03-20 DIAGNOSIS — S4992XA Unspecified injury of left shoulder and upper arm, initial encounter: Secondary | ICD-10-CM | POA: Diagnosis present

## 2022-03-20 NOTE — ED Triage Notes (Signed)
Pt brought in via ems from home.  Pt fell out of bed tonight.  Pt has left shoulder pain.  Limited rom  pt alert.

## 2022-03-21 ENCOUNTER — Emergency Department: Payer: BC Managed Care – PPO

## 2022-03-21 ENCOUNTER — Emergency Department
Admission: EM | Admit: 2022-03-21 | Discharge: 2022-03-21 | Disposition: A | Discharge status: Home / Self Care | Payer: BC Managed Care – PPO | Attending: Emergency Medicine | Admitting: Emergency Medicine

## 2022-03-21 DIAGNOSIS — S42002A Fracture of unspecified part of left clavicle, initial encounter for closed fracture: Secondary | ICD-10-CM

## 2022-03-21 MED ORDER — OXYCODONE-ACETAMINOPHEN 5-325 MG PO TABS: 2.0000 | ORAL_TABLET | Freq: Four times a day (QID) | ORAL | 0 refills | Status: DC | PRN

## 2022-03-21 MED ORDER — IBUPROFEN 800 MG PO TABS: 800.0000 mg | ORAL_TABLET | Freq: Three times a day (TID) | ORAL | 0 refills | Status: DC | PRN

## 2022-03-21 MED ORDER — ONDANSETRON 4 MG PO TBDP: 4.0000 mg | ORAL_TABLET | Freq: Four times a day (QID) | ORAL | 0 refills | Status: DC | PRN

## 2022-03-21 MED ORDER — OXYCODONE-ACETAMINOPHEN 5-325 MG PO TABS
2.0000 | ORAL_TABLET | Freq: Once | ORAL | Status: AC
  Administered 2022-03-21: 2 via ORAL
  Filled 2022-03-21: qty 2

## 2022-03-21 NOTE — Discharge Instructions (Addendum)
You are being provided a prescription for opiates (also known as narcotics) for pain control.  Opiates can be addictive and should only be used when absolutely necessary for pain control when other alternatives do not work.  We recommend you only use them for the recommended amount of time and only as prescribed.  Please do not take with other sedative medications or alcohol.  Please do not drive, operate machinery, make important decisions while taking opiates.  Please note that these medications can be addictive and have high abuse potential.  Patients can become addicted to narcotics after only taking them for a few days.  Please keep these medications locked away from children, teenagers or any family members with history of substance abuse.  Narcotic pain medicine may also make you constipated.  You may use over-the-counter medications such as MiraLAX, Colace to prevent constipation.  If you become constipated, you may use over-the-counter enemas as needed.  Itching and nausea are also common side effects of narcotic pain medication.  If you develop uncontrolled vomiting or a rash, please stop these medications and seek medical care.   I recommend avoiding NSAIDs such as ibuprofen, aspirin, Aleve, Goody powders since you have chronic kidney disease.

## 2022-03-21 NOTE — ED Notes (Signed)
Patient discharged at this time. Wheeled to lobby and assisted into vehicle. Breathing unlabored speaking in full sentences. Verbalized understanding of all discharge, follow up, and medication teaching. Discharged homed with all belongings.   

## 2022-03-21 NOTE — ED Provider Notes (Signed)
[facility] Provider Note    Event Date/Time   First MD Initiated Contact with Patient 03/21/22 0208     (approximate)   History   Shoulder Injury   HPI  [name] is a 66 y.o. female with history of diabetes, hypothyroidism, CKD who presents to the ED with left shoulder pain after she fell trying to get into bed.  She did hit her head but did not lose consciousness.  Not on blood thinners.  Complaining of diffuse back pain, left shoulder and left clavicle pain, right foot pain.   History provided by patient, husband, son.    Past Medical History:  Diagnosis Date   Arthritis    Blood dyscrasia    patient stated she is a "free bleeder"   Diabetes mellitus without complication ([place])    Diverticulosis    Hypothyroidism    Neuropathy    Renal insufficiency     Past Surgical History:  Procedure Laterality Date   ABDOMINAL HYSTERECTOMY     BILATERAL SALPINGECTOMY Bilateral 03/16/2017   Procedure: BILATERAL SALPINGECTOMY;  Surgeon: Schermerhorn, Gwen Her, MD;  Location: [facility] ORS;  Service: Gynecology;  Laterality: Bilateral;   COLONOSCOPY WITH PROPOFOL N/A 08/18/2018   Procedure: COLONOSCOPY WITH PROPOFOL;  Surgeon: Manya Silvas, MD;  Location: [facility] ENDOSCOPY;  Service: Endoscopy;  Laterality: N/A;   CYSTOCELE REPAIR N/A 03/16/2017   Procedure: ANTERIOR REPAIR (CYSTOCELE);  Surgeon: Schermerhorn, Gwen Her, MD;  Location: [facility] ORS;  Service: Gynecology;  Laterality: N/A;   ESOPHAGOGASTRODUODENOSCOPY (EGD) WITH PROPOFOL N/A 08/18/2018   Procedure: ESOPHAGOGASTRODUODENOSCOPY (EGD) WITH PROPOFOL;  Surgeon: Manya Silvas, MD;  Location: [facility] ENDOSCOPY;  Service: Endoscopy;  Laterality: N/A;   TEAR DUCT PROBING Right    TUBAL LIGATION     VAGINAL HYSTERECTOMY N/A 03/16/2017   Procedure: HYSTERECTOMY VAGINAL;  Surgeon: Schermerhorn, Gwen Her, MD;  Location: [facility] ORS;  Service: Gynecology;  Laterality: N/A;   WISDOM TOOTH EXTRACTION       MEDICATIONS:  Prior to Admission medications   Medication Sig Start Date End Date Taking? Authorizing Provider  calcium carbonate (OS-CAL - DOSED IN MG OF ELEMENTAL CALCIUM) 1250 (500 Ca) MG tablet Take 1 tablet by mouth daily with breakfast.    [provider]  dapagliflozin propanediol (FARXIGA) 10 MG TABS tablet Take 10 mg by mouth daily.    [provider]  gabapentin (NEURONTIN) 100 MG capsule Take 200 mg by mouth 2 (two) times daily.  08/16/15   [provider]  glipiZIDE (GLUCOTROL) 5 MG tablet Take 5 mg by mouth 2 (two) times daily before a meal.  06/02/16   [provider]  ibuprofen (ADVIL,MOTRIN) 200 MG tablet Take 200 mg by mouth every 6 (six) hours as needed for moderate pain.    [provider]  insulin glargine (LANTUS) 100 UNIT/ML Solostar Pen Inject 14 Units into the skin at bedtime.    [provider]  lactase (LACTAID) 3000 units tablet Take 3,000 Units by mouth daily before breakfast.    [provider]  levothyroxine (SYNTHROID) 50 MCG tablet Take 50 mcg by mouth daily. 10/12/17   [provider]  metFORMIN (GLUCOPHAGE-XR) 500 MG 24 hr tablet Take 1,000 mg by mouth daily.  08/31/15   [provider]  naproxen sodium (ALEVE) 220 MG tablet Take 220 mg by mouth 2 (two) times daily as needed (foot pain).    [provider]  omeprazole (PRILOSEC) 40 MG capsule Take 40 mg by mouth 2 (  two) times daily. Take 30 min after Synthroid and 30 min before breakfast    [provider]  ondansetron (ZOFRAN ODT) 4 MG disintegrating tablet Take 1 tablet (4 mg total) by mouth every 8 (eight) hours as needed for nausea or vomiting. 01/13/21   Naaman Plummer, MD  oxyCODONE-acetaminophen (PERCOCET/ROXICET) 5-325 MG tablet Take 1 tablet by mouth every 4 (four) hours as needed for severe pain.    [provider]  psyllium (REGULOID) 0.52 g capsule Take 0.52 g by mouth daily.    [provider]  solifenacin (VESICARE) 10 MG tablet Take 10 mg by mouth daily.    [provider]    Physical Exam   Triage Vital Signs: ED Triage Vitals  Enc Vitals Group     BP 03/20/22 2237 (!) 184/74     Pulse Rate 03/20/22 2237 95     Resp 03/20/22 2237 18     Temp 03/20/22 2237 97.6 F (36.4 C)     Temp Source 03/20/22 2237 Oral     SpO2 03/20/22 2237 98 %     Weight 03/20/22 2238 155 lb (70.3 kg)     Height 03/20/22 2238 '5\' 7"'$  (1.702 m)     Head Circumference --      Peak Flow --      Pain Score 03/20/22 2237 10     Pain Loc --      Pain Edu? --      Excl. in [place]? --     Most recent vital signs: Vitals:   03/20/22 2237  BP: (!) 184/74  Pulse: 95  Resp: 18  Temp: 97.6 F (36.4 C)  SpO2: 98%     CONSTITUTIONAL: Alert and oriented and responds appropriately to questions.  Chronically ill-appearing, appears uncomfortable HEAD: Normocephalic; atraumatic EYES: Conjunctivae clear, PERRL, EOMI ENT: normal nose; no rhinorrhea; moist mucous membranes; pharynx without lesions noted; no dental injury; no septal hematoma, no epistaxis; no facial deformity or bony tenderness NECK: Supple, tender over the cervical spine without step-off or deformity CARD: RRR; S1 and S2 appreciated; no murmurs, no clicks, no rubs, no gallops RESP: Normal chest excursion without splinting or tachypnea; breath sounds clear and equal bilaterally; no wheezes, no rhonchi, no rales; no hypoxia or respiratory distress CHEST:  chest wall stable, no crepitus or ecchymosis or deformity, nontender to palpation; no flail chest ABD/GI: Normal bowel sounds; non-distended; soft, non-tender, no rebound, no guarding; no ecchymosis or other lesions noted PELVIS:  stable, nontender to palpation BACK:  The back appears normal; tender to palpation throughout the thoracic and lumbar spine without step-off or deformity EXT: Tender over the base of the right great toe with there is a small area of ecchymosis  but no soft tissue swelling or deformity.  2+ DP and radial pulses bilaterally.  Tender over the left clavicle without skin tenting.  Tender diffusely over the left shoulder without loss of fullness in this joint.  No tenderness over the distal humerus, elbow, forearm, wrist or hand of the left arm.  Normal capillary refill.  Normal sensation diffusely. SKIN: Normal color for age and race; warm NEURO: No facial asymmetry, normal speech, moving all extremities equally  ED Results / Procedures / Treatments   LABS: (all labs ordered are listed, but only abnormal results are displayed) Labs Reviewed - No data to display   EKG:   RADIOLOGY: My personal review and interpretation of imaging: Left clavicle fracture.  All other imaging shows no acute  traumatic injury.  I have personally reviewed all radiology reports. CT HEAD WO CONTRAST (5MM)  Result Date: 03/21/2022 CLINICAL DATA:  [place] out of bed head and neck trauma EXAM: CT HEAD WITHOUT CONTRAST CT CERVICAL SPINE WITHOUT CONTRAST TECHNIQUE: Multidetector CT imaging of the head and cervical spine was performed following the standard protocol without intravenous contrast. Multiplanar CT image reconstructions of the cervical spine were also generated. RADIATION DOSE REDUCTION: This exam was performed according to the departmental dose-optimization program which includes automated exposure control, adjustment of the mA and/or kV according to patient size and/or use of iterative reconstruction technique. COMPARISON:  CT head 10/22/2021 FINDINGS: CT HEAD FINDINGS Brain: No intracranial hemorrhage, mass effect, or evidence of acute infarct. No hydrocephalus. No extra-axial fluid collection. Generalized cerebral atrophy. Ill-defined hypoattenuation within the cerebral white matter is nonspecific but consistent with chronic small vessel ischemic disease. Vascular: No hyperdense vessel. Intracranial arterial calcification. Skull: No fracture or focal lesion.  Sinuses/Orbits: No acute finding. Paranasal sinuses and mastoid air cells are well aerated. Other: None. CT CERVICAL SPINE FINDINGS Alignment: Unremarkable. Skull base and vertebrae: No acute fracture. No primary bone lesion or focal pathologic process. Soft tissues and spinal canal: No prevertebral fluid or swelling. No visible canal hematoma. Disc levels: Multilevel spondylosis, anterior osteophytosis and disc space height loss. Moderate cervical facet arthropathy. Posterior disc osteophyte complexes cause multilevel mild effacement of the ventral thecal sac. Uncovertebral spurring and facet arthropathy cause multilevel advanced neural foraminal narrowing including on the left at C3-C4, bilaterally at C4-C5, bilaterally at C5-C6 and on the left at C6-C7. Upper chest: Acute comminuted fracture of the mid left clavicle. The distal fragment is displaced posteriorly approximately 1.6 cm. Adjacent soft tissue swelling. Other: None. IMPRESSION: 1. No acute intracranial abnormality. 2. Acute comminuted fracture of the mid left clavicle. 3. No cervical spine fracture. Electronically Signed   By: Placido Sou M.D.   On: 03/21/2022 03:04   CT Cervical Spine Wo Contrast  Result Date: 03/21/2022 CLINICAL DATA:  [place] out of bed head and neck trauma EXAM: CT HEAD WITHOUT CONTRAST CT CERVICAL SPINE WITHOUT CONTRAST TECHNIQUE: Multidetector CT imaging of the head and cervical spine was performed following the standard protocol without intravenous contrast. Multiplanar CT image reconstructions of the cervical spine were also generated. RADIATION DOSE REDUCTION: This exam was performed according to the departmental dose-optimization program which includes automated exposure control, adjustment of the mA and/or kV according to patient size and/or use of iterative reconstruction technique. COMPARISON:  CT head 10/22/2021 FINDINGS: CT HEAD FINDINGS Brain: No intracranial hemorrhage, mass effect, or evidence of acute infarct. No  hydrocephalus. No extra-axial fluid collection. Generalized cerebral atrophy. Ill-defined hypoattenuation within the cerebral white matter is nonspecific but consistent with chronic small vessel ischemic disease. Vascular: No hyperdense vessel. Intracranial arterial calcification. Skull: No fracture or focal lesion. Sinuses/Orbits: No acute finding. Paranasal sinuses and mastoid air cells are well aerated. Other: None. CT CERVICAL SPINE FINDINGS Alignment: Unremarkable. Skull base and vertebrae: No acute fracture. No primary bone lesion or focal pathologic process. Soft tissues and spinal canal: No prevertebral fluid or swelling. No visible canal hematoma. Disc levels: Multilevel spondylosis, anterior osteophytosis and disc space height loss. Moderate cervical facet arthropathy. Posterior disc osteophyte complexes cause multilevel mild effacement of the ventral thecal sac. Uncovertebral spurring and facet arthropathy cause multilevel advanced neural foraminal narrowing including on the left at C3-C4, bilaterally at C4-C5, bilaterally at C5-C6 and on the left at C6-C7. Upper chest: Acute comminuted fracture of the  mid left clavicle. The distal fragment is displaced posteriorly approximately 1.6 cm. Adjacent soft tissue swelling. Other: None. IMPRESSION: 1. No acute intracranial abnormality. 2. Acute comminuted fracture of the mid left clavicle. 3. No cervical spine fracture. Electronically Signed   By: Placido Sou M.D.   On: 03/21/2022 03:04   DG Thoracic Spine 2 View  Result Date: 03/21/2022 CLINICAL DATA:  Status post fall out of bed. EXAM: THORACIC SPINE 2 VIEWS COMPARISON:  None Available. FINDINGS: There is no evidence of acute thoracic spine fracture. Alignment is normal. Moderate severity multilevel endplate sclerosis is seen throughout all levels of the thoracic spine. Mild anterior osteophyte formation is seen within the mid thoracic spine. Mild-to-moderate severity multilevel intervertebral disc  space narrowing is also seen within the mid and upper thoracic spine. No other significant bone abnormalities are identified. IMPRESSION: 1. No acute thoracic spine fracture or subluxation. 2. Moderate severity multilevel degenerative disc disease. Electronically Signed   By: Virgina Norfolk M.D.   On: 03/21/2022 02:52   DG Lumbar Spine Complete  Result Date: 03/21/2022 CLINICAL DATA:  Status post fall out of bed. EXAM: LUMBAR SPINE - COMPLETE 4+ VIEW COMPARISON:  None Available. FINDINGS: There is no evidence of an acute lumbar spine fracture. Alignment is normal. Moderate severity multilevel endplate sclerosis is seen throughout the lumbar spine. Marked severity bilateral facet joint hypertrophy is seen at the levels of L3-L4, L4-L5 and L5-S1. Mild intervertebral disc space narrowing is seen at L1-L2. IMPRESSION: 1. No acute findings. 2. Multilevel degenerative changes as described above. Electronically Signed   By: Virgina Norfolk M.D.   On: 03/21/2022 02:51   DG Foot Complete Right  Result Date: 03/21/2022 CLINICAL DATA:  Status post fall. EXAM: RIGHT FOOT COMPLETE - 3+ VIEW COMPARISON:  None Available. FINDINGS: There is no evidence of an acute fracture or dislocation. Marked severity degenerative changes are seen involving the right ankle and within the proximal and mid right foot. Loss of the plantar arch is noted. Soft tissues are unremarkable. IMPRESSION: 1. Marked severity degenerative changes without an acute osseous abnormality. Electronically Signed   By: Virgina Norfolk M.D.   On: 03/21/2022 02:49   DG Clavicle Left  Result Date: 03/21/2022 CLINICAL DATA:  Fall out of bed EXAM: LEFT CLAVICLE - 2+ VIEWS COMPARISON:  Left shoulder series today FINDINGS: Fracture through the mid to distal left clavicle. Mild displacement of the distal fragment. AC and glenohumeral joints intact. IMPRESSION: Displaced mid to distal left clavicle fracture. Electronically Signed   By: Rolm Baptise M.D.    On: 03/21/2022 00:05   DG Shoulder Left  Result Date: 03/21/2022 CLINICAL DATA:  Fall out of bed EXAM: LEFT SHOULDER - 2+ VIEW COMPARISON:  Left clavicle series today FINDINGS: Fracture through the mid to distal left clavicle. Mild displacement of the distal fragment. AC and glenohumeral joints intact. IMPRESSION: Displaced mid to distal left clavicle fracture. Electronically Signed   By: Rolm Baptise M.D.   On: 03/21/2022 00:04     PROCEDURES:  Critical Care performed:       Procedures    IMPRESSION / MDM / [place] / ED COURSE  I reviewed the triage vital signs and the nursing notes.  Patient here after mechanical fall getting into bed.     DIFFERENTIAL DIAGNOSIS (includes but not limited to):   Fracture, dislocation, sprain, concussion, intracranial hemorrhage, skull fracture, spinal fracture  Patient's presentation is most consistent with acute complicated illness / injury requiring diagnostic workup.  PLAN: X-ray of the left shoulder and clavicle was obtained from triage and reviewed and interpreted by myself and the radiologist.  Patient has a left clavicular fracture.  No skin tenting on exam and neurovascular intact distally.  We will place her in a sling/shoulder immobilizer and give pain medication.  Also complaining of neck pain, head injury and right foot pain.  We will send back for further imaging.   MEDICATIONS GIVEN IN ED: Medications  oxyCODONE-acetaminophen (PERCOCET/ROXICET) 5-325 MG per tablet 2 tablet (has no administration in time range)     ED COURSE: CTs of the head and cervical spine, x-rays of thoracic and lumbar spine x-ray of the right foot reviewed and interpreted by myself and the radiologist and show no acute traumatic injury.  Will discharge patient home with outpatient orthopedic follow-up and pain medication.  Discussed supportive care instructions and return precautions.   At this time, I do not feel there is any  life-threatening condition present. I reviewed all nursing notes, vitals, pertinent previous records.  All lab and urine results, EKGs, imaging ordered have been independently reviewed and interpreted by myself.  I reviewed all available radiology reports from any imaging ordered this visit.  Based on my assessment, I feel the patient is safe to be discharged home without further emergent workup and can continue workup as an outpatient as needed. Discussed all findings, treatment plan as well as usual and customary return precautions.  They verbalize understanding and are comfortable with this plan.  Outpatient follow-up has been provided as needed.  All questions have been answered.    CONSULTS:  none   OUTSIDE RECORDS REVIEWED: Reviewed patient's last office visits with [facility] clinic on 01/08/2022.       FINAL CLINICAL IMPRESSION(S) / ED DIAGNOSES   Final diagnoses:  Closed displaced fracture of left clavicle, unspecified part of clavicle, initial encounter     Rx / DC Orders   ED Discharge Orders          Ordered    oxyCODONE-acetaminophen (PERCOCET/ROXICET) 5-325 MG tablet  Every 6 hours PRN        03/21/22 0255    ondansetron (ZOFRAN-ODT) 4 MG disintegrating tablet  Every 6 hours PRN        03/21/22 0255    ibuprofen (ADVIL) 800 MG tablet  Every 8 hours PRN,   Status:  Discontinued        03/21/22 0255             Note:  This document was prepared using Dragon voice recognition software and may include unintentional dictation errors.   [place], Delice Bison, DO 03/21/22 [phone]

## 2022-04-03 ENCOUNTER — Ambulatory Visit: Admit: 2022-04-03 | Payer: BC Managed Care – PPO | Admitting: Gastroenterology

## 2022-04-03 SURGERY — COLONOSCOPY
Anesthesia: General

## 2022-08-21 ENCOUNTER — Other Ambulatory Visit: Payer: Self-pay

## 2022-08-21 ENCOUNTER — Encounter: Payer: Self-pay | Admitting: Emergency Medicine

## 2022-08-21 ENCOUNTER — Emergency Department: Payer: BC Managed Care – PPO

## 2022-08-21 ENCOUNTER — Emergency Department
Admission: EM | Admit: 2022-08-21 | Discharge: 2022-08-21 | Disposition: A | Discharge status: Home / Self Care | Payer: BC Managed Care – PPO | Attending: Emergency Medicine | Admitting: Emergency Medicine

## 2022-08-21 DIAGNOSIS — R1011 Right upper quadrant pain: Secondary | ICD-10-CM | POA: Diagnosis not present

## 2022-08-21 DIAGNOSIS — N189 Chronic kidney disease, unspecified: Secondary | ICD-10-CM | POA: Diagnosis not present

## 2022-08-21 DIAGNOSIS — R1012 Left upper quadrant pain: Secondary | ICD-10-CM | POA: Insufficient documentation

## 2022-08-21 DIAGNOSIS — R112 Nausea with vomiting, unspecified: Secondary | ICD-10-CM | POA: Diagnosis present

## 2022-08-21 DIAGNOSIS — E1122 Type 2 diabetes mellitus with diabetic chronic kidney disease: Secondary | ICD-10-CM | POA: Insufficient documentation

## 2022-08-21 DIAGNOSIS — E039 Hypothyroidism, unspecified: Secondary | ICD-10-CM | POA: Diagnosis not present

## 2022-08-21 LAB — COMPREHENSIVE METABOLIC PANEL
ALT: 19 U/L (ref 0–44)
AST: 26 U/L (ref 15–41)
Albumin: 4 g/dL (ref 3.5–5.0)
Alkaline Phosphatase: 96 U/L (ref 38–126)
Anion gap: 12 (ref 5–15)
BUN: 28 mg/dL — ABNORMAL HIGH (ref 8–23)
CO2: 21 mmol/L — ABNORMAL LOW (ref 22–32)
Calcium: 8.9 mg/dL (ref 8.9–10.3)
Chloride: 106 mmol/L (ref 98–111)
Creatinine, Ser: 1.42 mg/dL — ABNORMAL HIGH (ref 0.44–1.00)
GFR, Estimated: 41 mL/min — ABNORMAL LOW (ref >60)
Glucose, Bld: 106 mg/dL — ABNORMAL HIGH (ref 70–99)
Potassium: 4.1 mmol/L (ref 3.5–5.1)
Sodium: 139 mmol/L (ref 135–145)
Total Bilirubin: 0.9 mg/dL (ref 0.3–1.2)
Total Protein: 7.4 g/dL (ref 6.5–8.1)

## 2022-08-21 LAB — URINALYSIS, ROUTINE W REFLEX MICROSCOPIC
Bacteria, UA: NONE SEEN
Bilirubin Urine: NEGATIVE
Glucose, UA: >=500 mg/dL — AB
Hgb urine dipstick: NEGATIVE
Ketones, ur: 20 mg/dL — AB
Nitrite: NEGATIVE
Protein, ur: NEGATIVE mg/dL
Specific Gravity, Urine: 1.026 (ref 1.005–1.030)
pH: 5 (ref 5.0–8.0)

## 2022-08-21 LAB — CBC
HCT: 34.7 % — ABNORMAL LOW (ref 36.0–46.0)
Hemoglobin: 11.2 g/dL — ABNORMAL LOW (ref 12.0–15.0)
MCH: 28.1 pg (ref 26.0–34.0)
MCHC: 32.3 g/dL (ref 30.0–36.0)
MCV: 87 fL (ref 80.0–100.0)
Platelets: 154 10*3/uL (ref 150–400)
RBC: 3.99 MIL/uL (ref 3.87–5.11)
RDW: 14.2 % (ref 11.5–15.5)
WBC: 6 10*3/uL (ref 4.0–10.5)
nRBC: 0 % (ref 0.0–0.2)

## 2022-08-21 LAB — LACTIC ACID, PLASMA: Lactic Acid, Venous: 1.4 mmol/L (ref 0.5–1.9)

## 2022-08-21 LAB — LIPASE, BLOOD: Lipase: 52 U/L — ABNORMAL HIGH (ref 11–51)

## 2022-08-21 LAB — TROPONIN I (HIGH SENSITIVITY): Troponin I (High Sensitivity): 8 ng/L (ref <18)

## 2022-08-21 LAB — CBG MONITORING, ED: Glucose-Capillary: 95 mg/dL (ref 70–99)

## 2022-08-21 MED ORDER — SODIUM CHLORIDE 0.9 % IV BOLUS
1000.0000 mL | Freq: Once | INTRAVENOUS | Status: AC
  Administered 2022-08-21: 1000 mL via INTRAVENOUS

## 2022-08-21 MED ORDER — FAMOTIDINE IN NACL 20-0.9 MG/50ML-% IV SOLN
20.0000 mg | Freq: Once | INTRAVENOUS | Status: AC
  Administered 2022-08-21: 20 mg via INTRAVENOUS
  Filled 2022-08-21: qty 50

## 2022-08-21 MED ORDER — METOCLOPRAMIDE HCL 5 MG/ML IJ SOLN
10.0000 mg | Freq: Once | INTRAMUSCULAR | Status: AC
  Administered 2022-08-21: 10 mg via INTRAVENOUS
  Filled 2022-08-21: qty 2

## 2022-08-21 MED ORDER — METOCLOPRAMIDE HCL 10 MG PO TABS: 10.0000 mg | ORAL_TABLET | Freq: Three times a day (TID) | ORAL | 1 refills | Status: DC | PRN

## 2022-08-21 MED ORDER — IOHEXOL 300 MG/ML  SOLN
75.0000 mL | Freq: Once | INTRAMUSCULAR | Status: AC | PRN
  Administered 2022-08-21: 70 mL via INTRAVENOUS

## 2022-08-21 MED ORDER — OMEPRAZOLE 40 MG PO CPDR: 40.0000 mg | DELAYED_RELEASE_CAPSULE | Freq: Two times a day (BID) | ORAL | 2 refills | Status: DC

## 2022-08-21 NOTE — ED Provider Notes (Addendum)
I received signout on this patient who has had nausea and vomiting, see full H&P by original provider.  Workup thus far has been largely unremarkable including a CT scan with no acute surgical pathologies.  Thought to be gastroparesis or gastritis.  Improved symptoms already.  Will give further fluid bolus, reassessment and follow-up on pending labs including troponin lactic and UA.  If workup is unremarkable patient feels better plan is for discharge home.  Patient feels markedly better, initial troponin lactic normal.  Urinalysis normal.  DC   Lucillie Garfinkel, MD 08/21/22 1642    Lucillie Garfinkel, MD 08/21/22 [phone]

## 2022-08-21 NOTE — ED Triage Notes (Signed)
Pt to ED via [facility] from [facility] for nausea and vomiting x 2 days. Pt states that she is having burning pain in her epigastric area as well and headache. Pt states that she has been vomiting mucus. Pt is in NAD.

## 2022-08-21 NOTE — ED Provider Notes (Signed)
[facility] Provider Note    Event Date/Time   First MD Initiated Contact with Patient 08/21/22 1259     (approximate)   History   Abdominal Pain, Nausea, and Emesis   HPI  [name] is a 67 y.o. female with a history of diabetes, hypothyroidism, and CKD who presents with nausea and vomiting for the last 2 days, persistent course, associated with heartburn but not with any abdominal pain.  The patient denies any diarrhea.  She has had no blood in the vomitus.  She denies any fever or chills.  The patient does not believe she has been told of any history of gastroparesis.  I reviewed the past medical records.  The patient's most recent ED visit was on 9/15 of last year for shoulder pain after a fall.  Her most recent outpatient encounter was at her endocrinology clinic yesterday at which time she came to the desk vomiting and staff at her to schedule a telehealth visit with her but she was unable to do so.   Physical Exam   Triage Vital Signs: ED Triage Vitals  Enc Vitals Group     BP      Pulse      Resp      Temp      Temp src      SpO2      Weight      Height      Head Circumference      Peak Flow      Pain Score      Pain Loc      Pain Edu?      Excl. in [place]?     Most recent vital signs: Vitals:   08/21/22 1305  BP: (!) 156/68  Pulse: 98  Resp: (!) 21  SpO2: 100%     General: Alert and oriented, uncomfortable appearing, actively retching. CV:  Good peripheral perfusion.  Resp:  Normal effort.  Abd:  Soft with mild bilateral upper quadrant discomfort to palpation.  No peritoneal signs.  No distention.  Other:  Dry mucous membranes.  No jaundice or scleral icterus.   ED Results / Procedures / Treatments   Labs (all labs ordered are listed, but only abnormal results are displayed) Labs Reviewed  LIPASE, BLOOD - Abnormal; Notable for the following components:      Result Value   Lipase 52 (*)    All other components within  normal limits  COMPREHENSIVE METABOLIC PANEL - Abnormal; Notable for the following components:   CO2 21 (*)    Glucose, Bld 106 (*)    BUN 28 (*)    Creatinine, Ser 1.42 (*)    GFR, Estimated 41 (*)    All other components within normal limits  CBC - Abnormal; Notable for the following components:   Hemoglobin 11.2 (*)    HCT 34.7 (*)    All other components within normal limits  URINALYSIS, ROUTINE W REFLEX MICROSCOPIC  LACTIC ACID, PLASMA  LACTIC ACID, PLASMA  CBG MONITORING, ED  TROPONIN I (HIGH SENSITIVITY)  TROPONIN I (HIGH SENSITIVITY)     EKG  ED ECG REPORT I, Arta Silence, the attending physician, personally viewed and interpreted this ECG.  Date: 08/21/2022 EKG Time: 1348 Rate: 97 Rhythm: normal sinus rhythm QRS Axis: normal Intervals: RBBB  ST/T Wave abnormalities: normal Narrative Interpretation: no evidence of acute ischemia    RADIOLOGY  CT abdomen/pelvis: I independently viewed and interpreted the images.  There are no  dilated bowel loops or any free fluid or free air.  Radiology impression is as follows:  IMPRESSION:  1. Diffuse wall thickening of the esophagus with some nodularity and  irregularity. Please correlate for prior workup. This was seen  previously.  2. Small amount of simple free fluid in the pelvis, nonspecific.  3. Left-sided colonic diverticula without evidence of acute  diverticulitis.  4. Normal appendix.  5. Slight anasarca.  6. Aortic atherosclerosis.    PROCEDURES:  Critical Care performed: No  Procedures   MEDICATIONS ORDERED IN ED: Medications  sodium chloride 0.9 % bolus 1,000 mL (1,000 mLs Intravenous New Bag/Given 08/21/22 1406)  metoCLOPramide (REGLAN) injection 10 mg (10 mg Intravenous Given 08/21/22 1359)  famotidine (PEPCID) IVPB 20 mg premix (20 mg Intravenous New Bag/Given 08/21/22 1409)  iohexol (OMNIPAQUE) 300 MG/ML solution 75 mL (70 mLs Intravenous Contrast Given 08/21/22 1438)     IMPRESSION /  MDM / ASSESSMENT AND PLAN / ED COURSE  I reviewed the triage vital signs and the nursing notes.  67 year old female with PMH as noted above presents with 2 days of persistent nausea, vomiting, and heartburn with no significant abdominal pain and diarrhea.  She has had similar episodes previously.  On exam she has no peritoneal signs.  Vital signs are normal except for hypertension.  Differential diagnosis includes, but is not limited to, viral gastroenteritis, foodborne illness, gastritis, gastroparesis, esophagitis, pancreatitis, other hepatobiliary etiology.  We will obtain lab workup, CT abdomen/pelvis, and treat with fluids and antiemetics.  Patient's presentation is most consistent with acute presentation with potential threat to life or bodily function.  The patient is on the cardiac monitor to evaluate for evidence of arrhythmia and/or significant heart rate changes.  ----------------------------------------- 3:36 PM on 08/21/2022 -----------------------------------------  Lab workup is overall unremarkable.  There is no leukocytosis.  Lipase is borderline.  LFTs and alk phos are normal.  CT shows some esophageal inflammation which appears chronic.  It is otherwise negative for acute findings.  Overall presentation is most consistent with gastroparesis or gastritis.  The patient reports improved symptoms.  She has only received a small amount of the fluid so far.  I have signed her out to the oncoming ED physician to reassess after she completes the fluid bolus.  If her symptoms have improved further and she is tolerating p.o. she may be appropriate for discharge home.   FINAL CLINICAL IMPRESSION(S) / ED DIAGNOSES   Final diagnoses:  Nausea and vomiting, unspecified vomiting type     Rx / DC Orders   ED Discharge Orders     None        Note:  This document was prepared using Dragon voice recognition software and may include unintentional dictation errors.    Arta Silence, MD 08/21/22 1538

## 2022-08-21 NOTE — ED Notes (Signed)
Pt to CT

## 2022-08-21 NOTE — ED Notes (Signed)
Pt back from CT

## 2022-08-21 NOTE — ED Notes (Signed)
Pt placed on the bedpan. No results. Pt taken off of the bedpan

## 2022-08-21 NOTE — Discharge Instructions (Addendum)
Continue to take your Prilosec for heartburn.  Take Reglan as needed for nausea or vomiting as prescribed.  Drink plenty of fluids to stay well-hydrated, find Pedialyte or similar electrolyte rehydration formulas at your local pharmacy.  Thank you for choosing [place] for your health care today!  Please see your primary doctor this week for a follow up appointment.   Sometimes, in the early stages of certain disease courses it is difficult to detect in the emergency department evaluation -- so, it is important that you continue to monitor your symptoms and call your doctor right away or return to the emergency department if you develop any new or worsening symptoms.  Please go to the following website to schedule new (and existing) patient appointments:   [email or web address]  If you do not have a primary doctor try calling the following clinics to establish care:  If you have insurance:  [facility] [phone] [place] [address]   [facility]  [phone] [place] [place] [address]   If you do not have insurance:  [facility]  [phone] [address], [place] [address]   The following is another list of primary care offices in the area who are accepting new patients at this time.  Please reach out to one of them directly and let them know you would like to schedule an appointment to follow up on an Emergency Department visit, and/or to establish a new primary care provider (PCP).  There are likely other primary care clinics in the are who are accepting new patients, but this is an excellent place to start:  [place] physician: Dr Lavon Paganini [address] [place], [place] [address] [phone]  [facility] Lead Physician: Dr Steele Sizer [address], [place], [place] [address] [phone]  [place] Physician: Dr Park Liter [address] [place], [place] [address] [phone]  [facility] Lead Physician: Dr Dewaine Oats [place], [place], [place] [address] [phone]  [place] at  Physician: Dr Halina Maidens [address] [place], [place] [address] [phone]   It was my pleasure to care for you today.

## 2022-11-12 ENCOUNTER — Other Ambulatory Visit: Payer: Self-pay | Admitting: Obstetrics and Gynecology

## 2022-11-12 DIAGNOSIS — Z1231 Encounter for screening mammogram for malignant neoplasm of breast: Secondary | ICD-10-CM

## 2023-04-13 ENCOUNTER — Inpatient Hospital Stay: Payer: Medicare HMO

## 2023-04-13 ENCOUNTER — Inpatient Hospital Stay
Admission: EM | Admit: 2023-04-13 | Discharge: 2023-04-22 | DRG: 052 | Disposition: A | Discharge status: Rehabilitation Facility | Payer: Medicare HMO | Attending: Osteopathic Medicine | Admitting: Osteopathic Medicine

## 2023-04-13 ENCOUNTER — Other Ambulatory Visit: Payer: Self-pay

## 2023-04-13 ENCOUNTER — Emergency Department: Payer: Medicare HMO

## 2023-04-13 ENCOUNTER — Emergency Department: Payer: BC Managed Care – PPO

## 2023-04-13 ENCOUNTER — Encounter: Payer: Self-pay | Admitting: Emergency Medicine

## 2023-04-13 DIAGNOSIS — Z90710 Acquired absence of both cervix and uterus: Secondary | ICD-10-CM

## 2023-04-13 DIAGNOSIS — Z79890 Hormone replacement therapy (postmenopausal): Secondary | ICD-10-CM

## 2023-04-13 DIAGNOSIS — Z8249 Family history of ischemic heart disease and other diseases of the circulatory system: Secondary | ICD-10-CM | POA: Diagnosis not present

## 2023-04-13 DIAGNOSIS — E869 Volume depletion, unspecified: Secondary | ICD-10-CM | POA: Diagnosis not present

## 2023-04-13 DIAGNOSIS — M4802 Spinal stenosis, cervical region: Secondary | ICD-10-CM | POA: Diagnosis present

## 2023-04-13 DIAGNOSIS — W1830XA Fall on same level, unspecified, initial encounter: Secondary | ICD-10-CM | POA: Diagnosis present

## 2023-04-13 DIAGNOSIS — G8250 Quadriplegia, unspecified: Secondary | ICD-10-CM | POA: Diagnosis not present

## 2023-04-13 DIAGNOSIS — S329XXA Fracture of unspecified parts of lumbosacral spine and pelvis, initial encounter for closed fracture: Secondary | ICD-10-CM | POA: Diagnosis present

## 2023-04-13 DIAGNOSIS — D759 Disease of blood and blood-forming organs, unspecified: Secondary | ICD-10-CM | POA: Diagnosis present

## 2023-04-13 DIAGNOSIS — E1122 Type 2 diabetes mellitus with diabetic chronic kidney disease: Secondary | ICD-10-CM | POA: Diagnosis present

## 2023-04-13 DIAGNOSIS — S0001XA Abrasion of scalp, initial encounter: Secondary | ICD-10-CM | POA: Diagnosis present

## 2023-04-13 DIAGNOSIS — R519 Headache, unspecified: Secondary | ICD-10-CM | POA: Diagnosis not present

## 2023-04-13 DIAGNOSIS — Y990 Civilian activity done for income or pay: Secondary | ICD-10-CM

## 2023-04-13 DIAGNOSIS — W19XXXA Unspecified fall, initial encounter: Secondary | ICD-10-CM

## 2023-04-13 DIAGNOSIS — Z803 Family history of malignant neoplasm of breast: Secondary | ICD-10-CM

## 2023-04-13 DIAGNOSIS — G9520 Unspecified cord compression: Secondary | ICD-10-CM | POA: Diagnosis present

## 2023-04-13 DIAGNOSIS — I951 Orthostatic hypotension: Secondary | ICD-10-CM | POA: Diagnosis not present

## 2023-04-13 DIAGNOSIS — Y92512 Supermarket, store or market as the place of occurrence of the external cause: Secondary | ICD-10-CM

## 2023-04-13 DIAGNOSIS — R338 Other retention of urine: Secondary | ICD-10-CM | POA: Diagnosis not present

## 2023-04-13 DIAGNOSIS — N1832 Chronic kidney disease, stage 3b: Secondary | ICD-10-CM | POA: Diagnosis present

## 2023-04-13 DIAGNOSIS — K5900 Constipation, unspecified: Secondary | ICD-10-CM | POA: Diagnosis not present

## 2023-04-13 DIAGNOSIS — M19011 Primary osteoarthritis, right shoulder: Secondary | ICD-10-CM | POA: Diagnosis present

## 2023-04-13 DIAGNOSIS — Z794 Long term (current) use of insulin: Secondary | ICD-10-CM | POA: Diagnosis not present

## 2023-04-13 DIAGNOSIS — R29898 Other symptoms and signs involving the musculoskeletal system: Secondary | ICD-10-CM | POA: Diagnosis not present

## 2023-04-13 DIAGNOSIS — S32511A Fracture of superior rim of right pubis, initial encounter for closed fracture: Principal | ICD-10-CM

## 2023-04-13 DIAGNOSIS — Z9889 Other specified postprocedural states: Secondary | ICD-10-CM

## 2023-04-13 DIAGNOSIS — S14129A Central cord syndrome at unspecified level of cervical spinal cord, initial encounter: Secondary | ICD-10-CM | POA: Diagnosis not present

## 2023-04-13 DIAGNOSIS — N319 Neuromuscular dysfunction of bladder, unspecified: Secondary | ICD-10-CM | POA: Diagnosis not present

## 2023-04-13 DIAGNOSIS — Z85038 Personal history of other malignant neoplasm of large intestine: Secondary | ICD-10-CM

## 2023-04-13 DIAGNOSIS — E1142 Type 2 diabetes mellitus with diabetic polyneuropathy: Secondary | ICD-10-CM | POA: Diagnosis not present

## 2023-04-13 DIAGNOSIS — Q396 Congenital diverticulum of esophagus: Secondary | ICD-10-CM | POA: Diagnosis not present

## 2023-04-13 DIAGNOSIS — M47812 Spondylosis without myelopathy or radiculopathy, cervical region: Secondary | ICD-10-CM | POA: Diagnosis present

## 2023-04-13 DIAGNOSIS — E1140 Type 2 diabetes mellitus with diabetic neuropathy, unspecified: Secondary | ICD-10-CM | POA: Diagnosis present

## 2023-04-13 DIAGNOSIS — Z880 Allergy status to penicillin: Secondary | ICD-10-CM

## 2023-04-13 DIAGNOSIS — S32591A Other specified fracture of right pubis, initial encounter for closed fracture: Secondary | ICD-10-CM | POA: Diagnosis present

## 2023-04-13 DIAGNOSIS — S14109D Unspecified injury at unspecified level of cervical spinal cord, subsequent encounter: Secondary | ICD-10-CM | POA: Diagnosis not present

## 2023-04-13 DIAGNOSIS — N183 Chronic kidney disease, stage 3 unspecified: Secondary | ICD-10-CM | POA: Diagnosis not present

## 2023-04-13 DIAGNOSIS — Z9851 Tubal ligation status: Secondary | ICD-10-CM

## 2023-04-13 DIAGNOSIS — N1831 Chronic kidney disease, stage 3a: Secondary | ICD-10-CM | POA: Diagnosis not present

## 2023-04-13 DIAGNOSIS — E890 Postprocedural hypothyroidism: Secondary | ICD-10-CM | POA: Diagnosis present

## 2023-04-13 DIAGNOSIS — M1611 Unilateral primary osteoarthritis, right hip: Secondary | ICD-10-CM | POA: Diagnosis present

## 2023-04-13 DIAGNOSIS — Z7984 Long term (current) use of oral hypoglycemic drugs: Secondary | ICD-10-CM | POA: Diagnosis not present

## 2023-04-13 DIAGNOSIS — M2578 Osteophyte, vertebrae: Secondary | ICD-10-CM | POA: Diagnosis present

## 2023-04-13 DIAGNOSIS — E11649 Type 2 diabetes mellitus with hypoglycemia without coma: Secondary | ICD-10-CM | POA: Diagnosis present

## 2023-04-13 DIAGNOSIS — N179 Acute kidney failure, unspecified: Secondary | ICD-10-CM | POA: Diagnosis not present

## 2023-04-13 DIAGNOSIS — E872 Acidosis, unspecified: Secondary | ICD-10-CM | POA: Diagnosis present

## 2023-04-13 DIAGNOSIS — R1310 Dysphagia, unspecified: Secondary | ICD-10-CM | POA: Diagnosis not present

## 2023-04-13 DIAGNOSIS — M6281 Muscle weakness (generalized): Secondary | ICD-10-CM | POA: Diagnosis present

## 2023-04-13 DIAGNOSIS — K219 Gastro-esophageal reflux disease without esophagitis: Secondary | ICD-10-CM | POA: Diagnosis not present

## 2023-04-13 DIAGNOSIS — K592 Neurogenic bowel, not elsewhere classified: Secondary | ICD-10-CM | POA: Diagnosis not present

## 2023-04-13 DIAGNOSIS — G950 Syringomyelia and syringobulbia: Secondary | ICD-10-CM | POA: Diagnosis not present

## 2023-04-13 DIAGNOSIS — D631 Anemia in chronic kidney disease: Secondary | ICD-10-CM | POA: Diagnosis present

## 2023-04-13 DIAGNOSIS — R053 Chronic cough: Secondary | ICD-10-CM | POA: Diagnosis present

## 2023-04-13 DIAGNOSIS — Z881 Allergy status to other antibiotic agents status: Secondary | ICD-10-CM

## 2023-04-13 DIAGNOSIS — K222 Esophageal obstruction: Secondary | ICD-10-CM | POA: Diagnosis not present

## 2023-04-13 DIAGNOSIS — Z90722 Acquired absence of ovaries, bilateral: Secondary | ICD-10-CM

## 2023-04-13 DIAGNOSIS — Z79899 Other long term (current) drug therapy: Secondary | ICD-10-CM

## 2023-04-13 DIAGNOSIS — Z833 Family history of diabetes mellitus: Secondary | ICD-10-CM

## 2023-04-13 DIAGNOSIS — Z8719 Personal history of other diseases of the digestive system: Secondary | ICD-10-CM

## 2023-04-13 LAB — TSH: TSH: 0.053 u[IU]/mL — ABNORMAL LOW (ref 0.350–4.500)

## 2023-04-13 LAB — CBC WITH DIFFERENTIAL/PLATELET
Abs Immature Granulocytes: 0.1 10*3/uL — ABNORMAL HIGH (ref 0.00–0.07)
Basophils Absolute: 0 10*3/uL (ref 0.0–0.1)
Basophils Relative: 0 %
Eosinophils Absolute: 0.1 10*3/uL (ref 0.0–0.5)
Eosinophils Relative: 2 %
HCT: 29.2 % — ABNORMAL LOW (ref 36.0–46.0)
Hemoglobin: 9.8 g/dL — ABNORMAL LOW (ref 12.0–15.0)
Immature Granulocytes: 2 %
Lymphocytes Relative: 31 %
Lymphs Abs: 1.8 10*3/uL (ref 0.7–4.0)
MCH: 29.3 pg (ref 26.0–34.0)
MCHC: 33.6 g/dL (ref 30.0–36.0)
MCV: 87.2 fL (ref 80.0–100.0)
Monocytes Absolute: 0.4 10*3/uL (ref 0.1–1.0)
Monocytes Relative: 7 %
Neutro Abs: 3.3 10*3/uL (ref 1.7–7.7)
Neutrophils Relative %: 58 %
Platelets: 181 10*3/uL (ref 150–400)
RBC: 3.35 MIL/uL — ABNORMAL LOW (ref 3.87–5.11)
RDW: 14.6 % (ref 11.5–15.5)
WBC: 5.7 10*3/uL (ref 4.0–10.5)
nRBC: 0 % (ref 0.0–0.2)

## 2023-04-13 LAB — T4, FREE: Free T4: 2.01 ng/dL — ABNORMAL HIGH (ref 0.61–1.12)

## 2023-04-13 LAB — COMPREHENSIVE METABOLIC PANEL
ALT: 22 U/L (ref 0–44)
AST: 30 U/L (ref 15–41)
Albumin: 3.4 g/dL — ABNORMAL LOW (ref 3.5–5.0)
Alkaline Phosphatase: 72 U/L (ref 38–126)
Anion gap: 6 (ref 5–15)
BUN: 32 mg/dL — ABNORMAL HIGH (ref 8–23)
CO2: 19 mmol/L — ABNORMAL LOW (ref 22–32)
Calcium: 8.6 mg/dL — ABNORMAL LOW (ref 8.9–10.3)
Chloride: 115 mmol/L — ABNORMAL HIGH (ref 98–111)
Creatinine, Ser: 1.46 mg/dL — ABNORMAL HIGH (ref 0.44–1.00)
GFR, Estimated: 40 mL/min — ABNORMAL LOW (ref >60)
Glucose, Bld: 70 mg/dL (ref 70–99)
Potassium: 4.2 mmol/L (ref 3.5–5.1)
Sodium: 140 mmol/L (ref 135–145)
Total Bilirubin: 0.6 mg/dL (ref 0.3–1.2)
Total Protein: 7.3 g/dL (ref 6.5–8.1)

## 2023-04-13 LAB — HEMOGLOBIN A1C
Hgb A1c MFr Bld: 5.9 % — ABNORMAL HIGH (ref 4.8–5.6)
Mean Plasma Glucose: 122.63 mg/dL

## 2023-04-13 LAB — CBG MONITORING, ED
Glucose-Capillary: 61 mg/dL — ABNORMAL LOW (ref 70–99)
Glucose-Capillary: 77 mg/dL (ref 70–99)
Glucose-Capillary: 125 mg/dL — ABNORMAL HIGH (ref 70–99)
Glucose-Capillary: 126 mg/dL — ABNORMAL HIGH (ref 70–99)

## 2023-04-13 LAB — GLUCOSE, CAPILLARY
Glucose-Capillary: 120 mg/dL — ABNORMAL HIGH (ref 70–99)
Glucose-Capillary: 120 mg/dL — ABNORMAL HIGH (ref 70–99)
Glucose-Capillary: 162 mg/dL — ABNORMAL HIGH (ref 70–99)
Glucose-Capillary: 178 mg/dL — ABNORMAL HIGH (ref 70–99)

## 2023-04-13 LAB — MRSA NEXT GEN BY PCR, NASAL: MRSA by PCR Next Gen: NOT DETECTED

## 2023-04-13 MED ORDER — POLYETHYLENE GLYCOL 3350 17 G PO PACK: 17.0000 g | PACK | Freq: Every day | ORAL | Status: DC | PRN

## 2023-04-13 MED ORDER — DAPAGLIFLOZIN PROPANEDIOL 10 MG PO TABS: 10.0000 mg | ORAL_TABLET | Freq: Every day | ORAL | Status: DC

## 2023-04-13 MED ORDER — LACTASE 3000 UNITS PO TABS
3000.0000 [IU] | ORAL_TABLET | Freq: Every day | ORAL | Status: DC
  Administered 2023-04-14 – 2023-04-22 (×8): 3000 [IU] via ORAL
  Filled 2023-04-13 (×9): qty 1

## 2023-04-13 MED ORDER — ONDANSETRON HCL 4 MG/2ML IJ SOLN
4.0000 mg | Freq: Four times a day (QID) | INTRAMUSCULAR | Status: DC | PRN
  Administered 2023-04-16 – 2023-04-21 (×3): 4 mg via INTRAVENOUS
  Filled 2023-04-13 (×3): qty 2

## 2023-04-13 MED ORDER — SODIUM CHLORIDE 0.9% FLUSH
3.0000 mL | Freq: Two times a day (BID) | INTRAVENOUS | Status: DC
  Administered 2023-04-14 – 2023-04-20 (×13): 3 mL via INTRAVENOUS

## 2023-04-13 MED ORDER — GABAPENTIN 100 MG PO CAPS
200.0000 mg | ORAL_CAPSULE | Freq: Two times a day (BID) | ORAL | Status: DC
  Administered 2023-04-13 – 2023-04-22 (×16): 200 mg via ORAL
  Filled 2023-04-13 (×18): qty 2

## 2023-04-13 MED ORDER — METOCLOPRAMIDE HCL 10 MG PO TABS: 10.0000 mg | ORAL_TABLET | Freq: Three times a day (TID) | ORAL | Status: DC | PRN

## 2023-04-13 MED ORDER — BISACODYL 5 MG PO TBEC
5.0000 mg | DELAYED_RELEASE_TABLET | Freq: Every day | ORAL | Status: DC | PRN
  Administered 2023-04-18: 5 mg via ORAL
  Filled 2023-04-13: qty 1

## 2023-04-13 MED ORDER — DOCUSATE SODIUM 100 MG PO CAPS
100.0000 mg | ORAL_CAPSULE | Freq: Two times a day (BID) | ORAL | Status: DC | PRN
  Administered 2023-04-18: 100 mg via ORAL
  Filled 2023-04-13: qty 1

## 2023-04-13 MED ORDER — LIDOCAINE HCL 1 % IJ SOLN: 5.0000 mL | Freq: Once | INTRAMUSCULAR | Status: DC

## 2023-04-13 MED ORDER — INSULIN ASPART 100 UNIT/ML IJ SOLN: 0.0000 [IU] | Freq: Three times a day (TID) | INTRAMUSCULAR | Status: DC

## 2023-04-13 MED ORDER — HEPARIN SODIUM (PORCINE) 5000 UNIT/ML IJ SOLN
5000.0000 [IU] | Freq: Three times a day (TID) | INTRAMUSCULAR | Status: DC
  Administered 2023-04-13 – 2023-04-15 (×5): 5000 [IU] via SUBCUTANEOUS
  Filled 2023-04-13 (×5): qty 1

## 2023-04-13 MED ORDER — SODIUM CHLORIDE 0.9% FLUSH: 3.0000 mL | INTRAVENOUS | Status: DC | PRN

## 2023-04-13 MED ORDER — NOREPINEPHRINE 4 MG/250ML-% IV SOLN
0.0000 ug/min | INTRAVENOUS | Status: DC
  Administered 2023-04-13 – 2023-04-16 (×3): 2 ug/min via INTRAVENOUS
  Administered 2023-04-17: 3 ug/min via INTRAVENOUS
  Filled 2023-04-13 (×4): qty 250

## 2023-04-13 MED ORDER — OXYCODONE-ACETAMINOPHEN 5-325 MG PO TABS: 2.0000 | ORAL_TABLET | Freq: Four times a day (QID) | ORAL | Status: DC | PRN

## 2023-04-13 MED ORDER — PSYLLIUM 95 % PO PACK
1.0000 | PACK | Freq: Every day | ORAL | Status: DC
  Administered 2023-04-14 – 2023-04-22 (×8): 1 via ORAL
  Filled 2023-04-13 (×9): qty 1

## 2023-04-13 MED ORDER — OXYCODONE-ACETAMINOPHEN 5-325 MG PO TABS
2.0000 | ORAL_TABLET | Freq: Four times a day (QID) | ORAL | Status: DC | PRN
  Administered 2023-04-16 – 2023-04-19 (×3): 2 via ORAL
  Filled 2023-04-13 (×4): qty 2

## 2023-04-13 MED ORDER — SODIUM CHLORIDE 0.9 % IV SOLN: 250.0000 mL | INTRAVENOUS | Status: DC | PRN

## 2023-04-13 MED ORDER — KETOROLAC TROMETHAMINE 30 MG/ML IJ SOLN
30.0000 mg | Freq: Four times a day (QID) | INTRAMUSCULAR | Status: AC | PRN
  Administered 2023-04-13 – 2023-04-16 (×5): 30 mg via INTRAVENOUS
  Filled 2023-04-13 (×5): qty 1

## 2023-04-13 MED ORDER — DEXTROSE 50 % IV SOLN
1.0000 | Freq: Once | INTRAVENOUS | Status: AC
  Administered 2023-04-13: 50 mL via INTRAVENOUS
  Filled 2023-04-13: qty 50

## 2023-04-13 MED ORDER — ALBUTEROL SULFATE (2.5 MG/3ML) 0.083% IN NEBU: 2.5000 mg | INHALATION_SOLUTION | RESPIRATORY_TRACT | Status: DC | PRN

## 2023-04-13 MED ORDER — OXYCODONE-ACETAMINOPHEN 7.5-325 MG PO TABS: 1.0000 | ORAL_TABLET | ORAL | Status: DC | PRN

## 2023-04-13 MED ORDER — HYDROCODONE-ACETAMINOPHEN 5-325 MG PO TABS
1.0000 | ORAL_TABLET | Freq: Once | ORAL | Status: AC
  Administered 2023-04-13: 1 via ORAL
  Filled 2023-04-13: qty 1

## 2023-04-13 MED ORDER — DEXTROSE 10 % IV SOLN: INTRAVENOUS | Status: DC

## 2023-04-13 MED ORDER — SODIUM CHLORIDE 0.9 % IV SOLN: 250.0000 mL | INTRAVENOUS | Status: DC

## 2023-04-13 MED ORDER — FESOTERODINE FUMARATE ER 4 MG PO TB24
4.0000 mg | ORAL_TABLET | Freq: Every day | ORAL | Status: DC
  Administered 2023-04-14: 4 mg via ORAL
  Filled 2023-04-13: qty 1

## 2023-04-13 MED ORDER — LEVOTHYROXINE SODIUM 50 MCG PO TABS
50.0000 ug | ORAL_TABLET | Freq: Every day | ORAL | Status: DC
  Administered 2023-04-14: 50 ug via ORAL
  Filled 2023-04-13: qty 1

## 2023-04-13 MED ORDER — ORAL CARE MOUTH RINSE: 15.0000 mL | OROMUCOSAL | Status: DC | PRN

## 2023-04-13 MED ORDER — ACETAMINOPHEN 325 MG PO TABS
650.0000 mg | ORAL_TABLET | ORAL | Status: DC | PRN
  Administered 2023-04-21: 650 mg via ORAL
  Filled 2023-04-13: qty 2

## 2023-04-13 MED ORDER — CALCIUM CARBONATE 1250 (500 CA) MG PO TABS
1.0000 | ORAL_TABLET | Freq: Every day | ORAL | Status: DC
  Administered 2023-04-14 – 2023-04-22 (×8): 1250 mg via ORAL
  Filled 2023-04-13 (×9): qty 1

## 2023-04-13 MED ORDER — MAGNESIUM CITRATE PO SOLN: 1.0000 | Freq: Once | ORAL | Status: DC | PRN

## 2023-04-13 MED ORDER — CHLORHEXIDINE GLUCONATE CLOTH 2 % EX PADS
6.0000 | MEDICATED_PAD | Freq: Every day | CUTANEOUS | Status: DC
  Administered 2023-04-13 – 2023-04-15 (×3): 6 via TOPICAL

## 2023-04-13 NOTE — H&P (Signed)
NAME:  [name], MRN:  [id number], DOB:  03-29-1956, LOS: 0 ADMISSION DATE:  04/13/2023, CONSULTATION DATE: 04/13/2023 REFERRING MD: Dr. Vicente Males, CHIEF COMPLAINT: Fall   History of Present Illness:  This is a 67 yo female who presented to [facility] ER via EMS on 10/7 following a fall.  Per ER notes she was helping a friend and fell backwards.  She reports no memory of the fall but there is a question of if she loss consciousness.  EMS placed pt in a C-Collar.  Following the fall she developed a burning pain in her bilateral upper extremities which is worse on the right. She also had some burning pain in her feet.   ED Course  Upon arrival to the ER pt c/o having neck/head and right lower extremity pain.  She also endorsed RUE/RLE weakness when compared to the left side which is new.  Pt found to be hypoglycemic CBG 61, she received 1 amp of D50W followed by D10 infusion at 75 ml/hr.  Right hip x-ray revealed acute fractures of the right superior and inferior pubic rami.  Orthopedic surgery consulted by EDP and recommended weightbearing as tolerated.  MRI Cervical Spine concerning for cervical spinal cord injury.  Neurosurgeon Dr. Katrinka Blazing consulted by EDP.  He discussed the following options with pt and her family:  acute vs. delayed surgical management of the spinal cord injury. Following discussion pt and family decided to treat the spinal cord injury medically and have surgery when she is in a less acute stage of injury/illness.  PCCM team contacted for ICU admission for vasopressor to maintain map greater than 85 and placement of arterial line for closer bp monitoring.    Significant labs: chloride 115/CO2 19/BUN 32/creatinine 1.46/calcium 8.6/albumin 3.4/hgb 9.8  CT Head/Cervical Spine:  No acute intracranial abnormality. Soft tissue swelling along the right parietal scalp. No evidence of an underlying calvarial fracture. No acute fracture or traumatic malalignment of the cervical spine. Multilevel  degenerative changes with calcified disc bulges and ligamentum flavum hypertrophy that results in moderate to severe spinal canal narrowing at C2-C3 and at least moderate spinal canal narrowing at C3-C4. If the patient has new onset myelopathic symptoms, further evaluation with a cervical spine MRI is recommended.  MR Cervical Spine: No acute traumatic finding by MRI. No fracture or evidence of ligamentous injury. Severe spinal stenosis at C2-3 and C3-4 due to endplate osteophytes, bulging of the disc and posterior ligamentous prominence. AP diameter of the canal in the midline as narrow as 4.5- 4.7 mm. Effacement of the subarachnoid space and deformity of the cord. Patient would be at risk of compressive myelopathy. Bilateral foraminal stenosis also present at these levels. Moderate spinal stenosis at C4-5 and C5-6 due to endplate osteophytes, bulging of the disc and posterior ligamentous prominence. Narrowing of the subarachnoid space with slight indentation of the cord at C4-5. Bilateral foraminal stenosis at C4-5.  Pertinent  Medical History  Arthritis  Blood Dyscrasia  Type II Diabetes Mellitus  Diverticulosis  Hypothyroidism Neuropathy Colon Cancer  Stage III CKD  Multinodular Goiter s/p Total Thyroidectomy   Chronic Cough   Significant Hospital Events: Including procedures, antibiotic start and stop dates in addition to other pertinent events   10/7: Pt admitted to ICU with a cervical spinal cord injury following a fall treating medically for now per pt and pts family request with vasopressor to maintain map greater than 85 and for arterial line placement for closer bp monitoring   Interim History / Subjective:  Pt now with urinary retention.  C/o cervical spine and pelvic pain worse with movement.  She's states she has been extremely weak after getting her thyroid removed   Objective   Blood pressure 128/62, pulse (!) 104, temperature 97.7 F (36.5 C), temperature source Oral, resp.  rate 20, height 5\' 7"  (1.702 m), weight 70.3 kg, SpO2 93%.       No intake or output data in the 24 hours ending 04/13/23 1622 Filed Weights   04/13/23 1057  Weight: 70.3 kg    Examination: General: Acutely-ill appearing female, NAD resting in bed  HENT: Supple, no JVD  Lungs: Clear throughout, even, non labored  Cardiovascular: Sinus rhythm with RBBB, no m/r/g, 2+ radial/1+ distal pulses, edema of cervical spine  Abdomen: +BS x4, soft, non distended non tender  Extremities: Moves all extremities Neuro: Alert and oriented, following commands, motor strength right BUE/BLE 3/5; left BUE/BLE 5/5, PERRLA GU: Urinary rentention pending indwelling foley catheter placement   Resolved Hospital Problem list     Assessment & Plan:   #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: per neurosurgery due to previous significant cervical stenosis noted on MRI this likely puts her at high risk for a central cord injury with a flexion mechanism given the back of the head strike #Severe burning dysesthesias of BUE and BLE worse on the right Hx: Neuropathy  - Continuous telemetry monitoring  - Neurosurgery consulted appreciate input: per family request they would like to treat medically and proceed with surgery when she is in a less acute stage of injury/illness  - Per neurosurgery recommendations maintain map greater than 85 for at least 48 hrs; arterial line placement for close bp monitoring; pt may remove cervical collar while eating or laying in bed; neuro exams q2 hrs; PT/OT evaluation; and DOES NOT recommend strict bedrest  - Stat CT Thoracic/Lumbar Spine pending  - Continue outpatient gabapentin   #Acute fractures of the right superior and inferior pubic rami - Orthopedic consulted recommended weightbearing as tolerated  - Stat CT Abd/Pelvis pending   #Acute pain  - Prn percocet and toradol for pain management   #CKD stage III #Metabolic acidosis  - Trend BMP  - Replace electrolytes as  indicated  - Monitor UOP - Avoid nephrotoxic medications as able   #Anemia without s/sx of acute blood loss  Hx: Blood dyscrasia  - Trend CBC  - Monitor for s/sx of bleeding  - Transfuse for hgb <7  #Hypothyroidism  - Continue outpatient synthroid  - Will check TSH and free T3  #Hypoglycemia  Hx: Type II diabetes mellitus  - CBG's q1hr for now  - Continue D10W infusion for now  - Follow hypo/hyperglycemic protocol  - Hold outpatient glipizide, farxiga, metformin, and lantus for now   Best Practice (right click and "Reselect all SmartList Selections" daily)   Diet/type: Regular consistency (see orders) DVT prophylaxis: prophylactic heparin  GI prophylaxis: N/A Lines: Pending arterial line placement  Foley: Indwelling foley catheter placement pending  Code Status:  full code Last date of multidisciplinary goals of care discussion [N/A]  10/7: Updated pt regarding plan of care and prognosis  Labs   CBC: Recent Labs  Lab 04/13/23 1134  WBC 5.7  NEUTROABS 3.3  HGB 9.8*  HCT 29.2*  MCV 87.2  PLT 181    Basic Metabolic Panel: Recent Labs  Lab 04/13/23 1134  NA 140  K 4.2  CL 115*  CO2 19*  GLUCOSE 70  BUN 32*  CREATININE 1.46*  CALCIUM 8.6*   GFR: Estimated Creatinine Clearance: 37.4 mL/min (A) (by C-G formula based on SCr of 1.46 mg/dL (H)). Recent Labs  Lab 04/13/23 1134  WBC 5.7    Liver Function Tests: Recent Labs  Lab 04/13/23 1134  AST 30  ALT 22  ALKPHOS 72  BILITOT 0.6  PROT 7.3  ALBUMIN 3.4*   No results for input(s): "LIPASE", "AMYLASE" in the last 168 hours. No results for input(s): "AMMONIA" in the last 168 hours.  ABG No results found for: "PHART", "PCO2ART", "PO2ART", "HCO3", "TCO2", "ACIDBASEDEF", "O2SAT"   Coagulation Profile: No results for input(s): "INR", "PROTIME" in the last 168 hours.  Cardiac Enzymes: No results for input(s): "CKTOTAL", "CKMB", "CKMBINDEX", "TROPONINI" in the last 168 hours.  HbA1C: Hemoglobin  A1C  Date/Time Value Ref Range Status  02/20/2013 04:02 AM 10.3 (H) 4.2 - 6.3 % Final    Comment:    The American Diabetes Association recommends that a primary goal of therapy should be <7% and that physicians should reevaluate the treatment regimen in patients with HbA1c values consistently >8%.    Hgb A1c MFr Bld  Date/Time Value Ref Range Status  08/11/2021 08:39 PM 7.2 (H) 4.8 - 5.6 % Final    Comment:    (NOTE) Pre diabetes:          5.7%-6.4%  Diabetes:              >6.4%  Glycemic control for   <7.0% adults with diabetes     CBG: Recent Labs  Lab 04/13/23 1125 04/13/23 1500  GLUCAP 61* 77    Review of Systems: Positives in BOLD   Gen: Denies fever, chills, weight change, fatigue, night sweats HEENT: Denies blurred vision, double vision, hearing loss, tinnitus, sinus congestion, rhinorrhea, sore throat, neck stiffness, dysphagia PULM: Denies shortness of breath, cough, sputum production, hemoptysis, wheezing CV: Denies chest pain, edema, orthopnea, paroxysmal nocturnal dyspnea, palpitations GI: Denies abdominal pain, nausea, vomiting, diarrhea, hematochezia, melena, constipation, change in bowel habits GU: Denies dysuria, hematuria, polyuria, oliguria, urethral discharge Endocrine: Denies hot or cold intolerance, polyuria, polyphagia or appetite change Derm: Denies rash, dry skin, scaling or peeling skin change Heme: Denies easy bruising, bleeding, bleeding gums Neuro: Fall, headache, numbness, right bilateral upper and lower extremity weakness, slurred speech, loss of memory or consciousness  Past Medical History:  She,  has a past medical history of Arthritis, Blood dyscrasia, Diabetes mellitus without complication (HCC), Diverticulosis, Hypothyroidism, Neuropathy, and Renal insufficiency.   Surgical History:   Past Surgical History:  Procedure Laterality Date   ABDOMINAL HYSTERECTOMY     BILATERAL SALPINGECTOMY Bilateral 03/16/2017   Procedure: BILATERAL  SALPINGECTOMY;  Surgeon: Schermerhorn, Ihor Austin, MD;  Location: [facility] ORS;  Service: Gynecology;  Laterality: Bilateral;   COLONOSCOPY WITH PROPOFOL N/A 08/18/2018   Procedure: COLONOSCOPY WITH PROPOFOL;  Surgeon: Scot Jun, MD;  [facility] ENDOSCOPY;  Service: Endoscopy;  Laterality: N/A;   CYSTOCELE REPAIR N/A 03/16/2017   Procedure: ANTERIOR REPAIR (CYSTOCELE);  Surgeon: Schermerhorn, Ihor Austin, MD;  Location: [facility] ORS;  Service: Gynecology;  Laterality: N/A;   ESOPHAGOGASTRODUODENOSCOPY (EGD) WITH PROPOFOL N/A 08/18/2018   Procedure: ESOPHAGOGASTRODUODENOSCOPY (EGD) WITH PROPOFOL;  Surgeon: Scot Jun, MD;  Location: [facility] ENDOSCOPY;  Service: Endoscopy;  Laterality: N/A;   TEAR DUCT PROBING Right    TUBAL LIGATION     VAGINAL HYSTERECTOMY N/A 03/16/2017   Procedure: HYSTERECTOMY VAGINAL;  Surgeon: Schermerhorn, Ihor Austin, MD;  Location: [facility] ORS;  Service: Gynecology;  Laterality: N/A;  WISDOM TOOTH EXTRACTION       Social History:   reports that she has never smoked. She has never used smokeless tobacco. She reports that she does not drink alcohol and does not use drugs.   Family History:  Her family history includes Breast cancer in her maternal aunt; Diabetes in her father; Hypertension in her father and mother.   Allergies Allergies  Allergen Reactions   Augmentin [Amoxicillin-Pot Clavulanate] Itching   Ampicillin Rash     Home Medications  Prior to Admission medications   Medication Sig Start Date End Date Taking? Authorizing Provider  calcium carbonate (OS-CAL - DOSED IN MG OF ELEMENTAL CALCIUM) 1250 (500 Ca) MG tablet Take 1 tablet by mouth daily with breakfast.    [provider]  dapagliflozin propanediol (FARXIGA) 10 MG TABS tablet Take 10 mg by mouth daily.    [provider]  gabapentin (NEURONTIN) 100 MG capsule Take 200 mg by mouth 2 (two) times daily.  08/16/15   [provider]  glipiZIDE (GLUCOTROL) 5 MG tablet Take 5 mg by  mouth 2 (two) times daily before a meal.  06/02/16   [provider]  insulin glargine (LANTUS) 100 UNIT/ML Solostar Pen Inject 14 Units into the skin at bedtime.    [provider]  lactase (LACTAID) 3000 units tablet Take 3,000 Units by mouth daily before breakfast.    [provider]  levothyroxine (SYNTHROID) 50 MCG tablet Take 50 mcg by mouth daily. 10/12/17   [provider]  metFORMIN (GLUCOPHAGE-XR) 500 MG 24 hr tablet Take 1,000 mg by mouth daily.  08/31/15   [provider]  metoCLOPramide (REGLAN) 10 MG tablet Take 1 tablet (10 mg total) by mouth every 8 (eight) hours as needed for nausea or vomiting. 08/21/22 08/21/23  Dionne Bucy, MD  omeprazole (PRILOSEC) 40 MG capsule Take 1 capsule (40 mg total) by mouth 2 (two) times daily. Take 30 min after Synthroid and 30 min before breakfast 08/21/22 11/19/22  Pilar Jarvis, MD  ondansetron (ZOFRAN-ODT) 4 MG disintegrating tablet Take 1 tablet (4 mg total) by mouth every 6 (six) hours as needed for nausea or vomiting. 03/21/22   Ward, Layla Maw, DO  oxyCODONE-acetaminophen (PERCOCET/ROXICET) 5-325 MG tablet Take 2 tablets by mouth every 6 (six) hours as needed. 03/21/22   Ward, Layla Maw, DO  psyllium (REGULOID) 0.52 g capsule Take 0.52 g by mouth daily.    [provider]  solifenacin (VESICARE) 10 MG tablet Take 10 mg by mouth daily.    [provider]     Critical care time: 60 minutes     Zada Girt, AGNP  Pulmonary/Critical Care Pager [phone] (please enter 7 digits) PCCM Consult Pager [phone] (please enter 7 digits)

## 2023-04-13 NOTE — Procedures (Signed)
Arterial Catheter Insertion Procedure Note  Linday Rhodes  [id number]  05-03-1956  Date:04/13/23  Time:11:51 PM    Provider Performing: Cecelia Byars [place]    Procedure: Insertion of Arterial Line (56213) with US guidance (08657)   Indication(s) Blood pressure monitoring and/or need for frequent ABGs  Consent Risks of the procedure as well as the alternatives and risks of each were explained to the patient and/or caregiver.  Consent for the procedure was obtained and is signed in the bedside chart  Anesthesia Lidocaine SQ   Time Out Verified patient identification, verified procedure, site/side was marked, verified correct patient position, special equipment/implants available, medications/allergies/relevant history reviewed, required imaging and test results available.   Sterile Technique Maximal sterile technique including full sterile barrier drape, hand hygiene, sterile gown, sterile gloves, mask, hair covering, sterile ultrasound probe cover (if used).   Procedure Description Area of catheter insertion was cleaned with chlorhexidine and draped in sterile fashion. With real-time ultrasound guidance an arterial catheter was placed into the right femoral artery.  Appropriate arterial tracings confirmed on monitor.     Complications/Tolerance None; patient tolerated the procedure well.   EBL Minimal   Specimen(s) None  Betsey Holiday, AGACNP-BC Acute Care Nurse Practitioner [facility]   [phone] / [phone] Please see Amion for details.

## 2023-04-13 NOTE — Care Plan (Signed)
Discussed with ICU team   Patient will be admitted to ICU under PCCM service.Case discussed with ICU service, Neurosurgery. She apparently will need art line and aggressive MAP goals

## 2023-04-13 NOTE — Consult Note (Signed)
PHARMACY CONSULT NOTE - ELECTROLYTES  Pharmacy Consult for Electrolyte Monitoring and Replacement   Recent Labs: Height: 5\' 7"  (170.2 cm) Weight: 70.3 kg (154 lb 15.7 oz) IBW/kg (Calculated) : 61.6 Estimated Creatinine Clearance: 37.4 mL/min (A) (by C-G formula based on SCr of 1.46 mg/dL (H)). Potassium (mmol/L)  Date Value  04/13/2023 4.2  02/21/2013 3.9   Magnesium (mg/dL)  Date Value  86/57/8469 2.3  02/19/2013 1.6 (L)   Calcium (mg/dL)  Date Value  62/95/2841 8.6 (L)   Calcium, Total (mg/dL)  Date Value  32/44/0102 8.1 (L)   Albumin (g/dL)  Date Value  72/53/6644 3.4 (L)  02/19/2013 4.2   Sodium (mmol/L)  Date Value  04/13/2023 140  02/21/2013 142    Assessment  [name] is a 67 y.o. female presenting with cervical spinal cord injury after a fall.  PMH significant for arthritis, DM, diverticulitis, CKD. Pharmacy has been consulted to monitor and replace electrolytes.  Diet: none MIVF: D10 @ 75 mL/hr   Goal of Therapy: Electrolytes WNL  Plan:  No replacement needed at this time Check BMP, Mg, Phos with AM labs  Thank you for allowing pharmacy to be a part of this patient's care.  Talene Glastetter Rodriguez-Guzman PharmD, BCPS 04/13/2023 4:32 PM

## 2023-04-13 NOTE — ED Notes (Signed)
Pt gone to CT at this time.

## 2023-04-13 NOTE — ED Triage Notes (Signed)
Presents via EMS s/p fall  States she was helping a friend   she fell backward  Does not remember the fall  Possible LOC    Having pain to neck,head and right leg  Presents with c-collar in place  20 g IV in right hand 4 mg of zofran  50 mcg of fentanyl

## 2023-04-13 NOTE — ED Provider Notes (Signed)
[facility] Provider Note   Event Date/Time   First MD Initiated Contact with Patient 04/13/23 1126     (approximate) History  No chief complaint on file.  HPI [name] is a 67 y.o. female with a stated past medical history of type 2 diabetes who presents after mechanical fall from standing while in [place] where she works.  Patient states that she fell back and hit her head onto a pole suffering an abrasion to the right parietal scalp.  Patient endorses possible loss of consciousness.  Patient now complains of right-sided neck pain, head pain, right shoulder pain, and right hip pain.  C-collar in place upon arrival.  20-gauge IV to right hand and patient states she received Zofran and fentanyl prior to arrival.  Patient does endorse muscular weakness to the right upper extremity. ROS: Patient currently denies any vision changes, tinnitus, difficulty speaking, facial droop, sore throat, chest pain, shortness of breath, abdominal pain, nausea/vomiting/diarrhea, dysuria, or numbness/paresthesias in any extremity   Physical Exam  Triage Vital Signs: ED Triage Vitals  Encounter Vitals Group     BP 04/13/23 1112 (!) 148/68     Systolic BP Percentile --      Diastolic BP Percentile --      Pulse Rate 04/13/23 1112 92     Resp 04/13/23 1112 20     Temp 04/13/23 1112 97.6 F (36.4 C)     Temp Source 04/13/23 1112 Oral     SpO2 04/13/23 1112 93 %     Weight 04/13/23 1057 154 lb 15.7 oz (70.3 kg)     Height 04/13/23 1057 5\' 7"  (1.702 m)     Head Circumference --      Peak Flow --      Pain Score 04/13/23 1105 10     Pain Loc --      Pain Education --      Exclude from Growth Chart --    Most recent vital signs: Vitals:   04/13/23 1112 04/13/23 1544  BP: (!) 148/68 128/62  Pulse: 92 (!) 104  Resp: 20 20  Temp: 97.6 F (36.4 C) 97.7 F (36.5 C)  SpO2: 93% 93%   General: Awake, oriented x4. CV:  Good peripheral perfusion.  Resp:  Normal effort.   Abd:  No distention.  Other:  Elderly well-developed, well-nourished Caucasian female resting comfortably in no acute distress.  4/5 strength in her right upper extremity.  No midline tenderness on palpation of the neck.  Tenderness to palpation of the lateral aspect of the right hip and pain with any range of motion at the right hip.  Sensation and strength intact to bilateral lower extremities ED Results / Procedures / Treatments  Labs (all labs ordered are listed, but only abnormal results are displayed) Labs Reviewed  COMPREHENSIVE METABOLIC PANEL - Abnormal; Notable for the following components:      Result Value   Chloride 115 (*)    CO2 19 (*)    BUN 32 (*)    Creatinine, Ser 1.46 (*)    Calcium 8.6 (*)    Albumin 3.4 (*)    GFR, Estimated 40 (*)    All other components within normal limits  CBC WITH DIFFERENTIAL/PLATELET - Abnormal; Notable for the following components:   RBC 3.35 (*)    Hemoglobin 9.8 (*)    HCT 29.2 (*)    Abs Immature Granulocytes 0.10 (*)    All other components within normal limits  CBG  MONITORING, ED - Abnormal; Notable for the following components:   Glucose-Capillary 61 (*)    All other components within normal limits  URINALYSIS, ROUTINE W REFLEX MICROSCOPIC  HIV ANTIBODY (ROUTINE TESTING W REFLEX)  CBG MONITORING, ED  CBG MONITORING, ED  CBG MONITORING, ED  CBG MONITORING, ED  CBG MONITORING, ED  CBG MONITORING, ED  CBG MONITORING, ED  CBG MONITORING, ED  CBG MONITORING, ED  CBG MONITORING, ED  CBG MONITORING, ED  CBG MONITORING, ED  CBG MONITORING, ED  CBG MONITORING, ED  CBG MONITORING, ED  CBG MONITORING, ED  CBG MONITORING, ED   EKG ED ECG REPORT I, Merwyn Katos, the attending physician, personally viewed and interpreted this ECG. Date: 04/13/2023 EKG Time: 1119 Rate: 95 Rhythm: normal sinus rhythm QRS Axis: normal Intervals: normal ST/T Wave abnormalities: normal Narrative Interpretation: no evidence of acute  ischemia RADIOLOGY ED MD interpretation: Right hip x-ray shows acute fractures of the right superior and inferior pubic rami Right shoulder x-ray shows mild osteoarthritis without evidence of fracture or dislocation  CT of the head without contrast interpreted by me shows no evidence of acute abnormalities including no intracerebral hemorrhage, obvious masses, or significant edema  CT of the cervical spine interpreted independently by me and shows multilevel degenerative changes with calcified disc bulges and ligamentum flavum hypertrophy that results in moderate to severe spinal canal stenosis narrowing at C2-C3 and moderate canal stenosis at C3-C4  MRI of the cervical spine pending -Agree with radiology assessment Official radiology report(s): MR Cervical Spine Wo Contrast  Result Date: 04/13/2023 CLINICAL DATA:  Neck trauma. Ligamentous injury suspected. Fall with trauma to the head and neck. EXAM: MRI CERVICAL SPINE WITHOUT CONTRAST TECHNIQUE: Multiplanar, multisequence MR imaging of the cervical spine was performed. No intravenous contrast was administered. COMPARISON:  CT earlier same day FINDINGS: Alignment: No malalignment. Vertebrae: No evidence of regional fracture. No sign of anterior or posterior ligamentous injury by MRI. Cord: No sign of acute cord injury. See below regarding stenosis with chronic cord deformity. Posterior Fossa, vertebral arteries, paraspinal tissues: No traumatic regional finding. Disc levels: Foramen magnum is widely patent. 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 C2-3: Endplate osteophytes and bulging of the disc. Posterior ligamentous prominence. Severe spinal stenosis with AP diameter of the canal in the midline as narrow as 4.7 mm. Effacement of the subarachnoid space and deformity of the cord. Patient would be at risk of compressive myelopathy. Mild bilateral foraminal narrowing. C3-4: Endplate osteophytes  and bulging of the disc. Posterior ligamentous prominence. Spinal stenosis with AP diameter of the canal in the midline as narrow as 4.5 mm. Effacement of the subarachnoid space and deformity of the cord. This would also place the patient at risk of compressive myelopathy. There is moderate bilateral foraminal stenosis, left worse than right, with contributions from facet degeneration. C4-5: Endplate osteophytes and bulging of the disc. Mild posterior ligamentous prominence. Canal narrowing with AP diameter in the midline as narrow as 7 mm. Narrowing of the subarachnoid space with slight indentation of the cord. Bilateral foraminal stenosis. C5-6: Endplate osteophytes and bulging of the disc. Mild posterior ligamentous prominence. Minimal diameter of the canal in the midline 7.8 mm. No cord deformity. No significant foraminal stenosis. C6-7: Bulging of the disc.  No canal or foraminal stenosis. C7-T1: Bulging of the disc.  No canal or foraminal stenosis. IMPRESSION: 1. No acute traumatic finding by MRI. No fracture or evidence of ligamentous injury. 2. Severe spinal  stenosis at C2-3 and C3-4 due to endplate osteophytes, bulging of the disc and posterior ligamentous prominence. AP diameter of the canal in the midline as narrow as 4.5- 4.7 mm. Effacement of the subarachnoid space and deformity of the cord. Patient would be at risk of compressive myelopathy. Bilateral foraminal stenosis also present at these levels. 3. Moderate spinal stenosis at C4-5 and C5-6 due to endplate osteophytes, bulging of the disc and posterior ligamentous prominence. Narrowing of the subarachnoid space with slight indentation of the cord at C4-5. Bilateral foraminal stenosis at C4-5. Electronically Signed   By: Paulina Fusi M.D.   On: 04/13/2023 15:40   DG Hip Unilat With Pelvis 2-3 Views Right  Result Date: 04/13/2023 CLINICAL DATA:  Fall with right shoulder and R hip pain EXAM: DG HIP (WITH OR WITHOUT PELVIS) 2-3V RIGHT COMPARISON:   None Available. FINDINGS: There are acute fractures of right superior and inferior pubic rami. No other acute fracture or dislocation. No aggressive osseous lesion. Visualized sacral arcuate lines are unremarkable. Unremarkable symphysis pubis. There are mild degenerative changes of bilateral hip joints without significant joint space narrowing. Osteophytosis of the superior acetabulum. No radiopaque foreign bodies. IMPRESSION: 1. Acute fractures of the right superior and inferior pubic rami. Electronically Signed   By: Jules Schick M.D.   On: 04/13/2023 14:03   DG Shoulder Right  Result Date: 04/13/2023 CLINICAL DATA:  Fall with right shoulder and R hip pain EXAM: RIGHT SHOULDER - 2+ VIEW COMPARISON:  None Available. FINDINGS: No acute fracture or dislocation. No aggressive osseous lesion. Glenohumeral and acromioclavicular joints are normal in alignment and exhibit mild degenerative changes. No soft tissue swelling. No radiopaque foreign bodies. IMPRESSION: 1. Mild osteoarthritis. No acute fracture or dislocation. Electronically Signed   By: Jules Schick M.D.   On: 04/13/2023 14:00   CT Head Wo Contrast  Result Date: 04/13/2023 CLINICAL DATA:  Head trauma, minor (Age >= 65y); Neck trauma (Age >= 65y) EXAM: CT HEAD WITHOUT CONTRAST CT CERVICAL SPINE WITHOUT CONTRAST TECHNIQUE: Multidetector CT imaging of the head and cervical spine was performed following the standard protocol without intravenous contrast. Multiplanar CT image reconstructions of the cervical spine were also generated. RADIATION DOSE REDUCTION: This exam was performed according to the departmental dose-optimization program which includes automated exposure control, adjustment of the mA and/or kV according to patient size and/or use of iterative reconstruction technique. COMPARISON:  CT Head and C Spine 03/21/22 FINDINGS: CT HEAD FINDINGS Brain: No hemorrhage. No hydrocephalus. No extra-axial fluid collection. No CT evidence of an acute  cortical infarct. Mass effect. No mass lesion. Sequela of mild chronic microvascular ischemic change with generalized volume loss. Vascular: No hyperdense vessel or unexpected calcification. Skull: Soft swelling along the right parietal scalp. No evidence of an underlying calvarial fracture. Sinuses/Orbits: No middle ear or mastoid effusion. Paranasal sinuses are notable for mucosal thickening in the bilateral ethmoid and sphenoid sinuses. Orbits are unremarkable. Other: None. CT CERVICAL SPINE FINDINGS Alignment: Normal. Skull base and vertebrae: No acute fracture. No primary bone lesion or focal pathologic process. Soft tissues and spinal canal: No prevertebral fluid or swelling. No visible canal hematoma. Disc levels: There are multilevel degenerative changes with calcified disc bulges and ligamentum flavum hypertrophy that results in moderate to severe spinal canal narrowing at C2-C3 and at least moderate spinal canal narrowing at C3-C4. If with the patient has new onset myelopathic symptoms, further evaluation with a cervical spine MRI is recommended. Upper chest: Negative. Other: Status post thyroidectomy. IMPRESSION:  1. No acute intracranial abnormality. 2. Soft tissue swelling along the right parietal scalp. No evidence of an underlying calvarial fracture. 3. No acute fracture or traumatic malalignment of the cervical spine. 4. Multilevel degenerative changes with calcified disc bulges and ligamentum flavum hypertrophy that results in moderate to severe spinal canal narrowing at C2-C3 and at least moderate spinal canal narrowing at C3-C4. If the patient has new onset myelopathic symptoms, further evaluation with a cervical spine MRI is recommended. Electronically Signed   By: Lorenza Cambridge M.D.   On: 04/13/2023 12:49   CT Cervical Spine Wo Contrast  Result Date: 04/13/2023 CLINICAL DATA:  Head trauma, minor (Age >= 65y); Neck trauma (Age >= 65y) EXAM: CT HEAD WITHOUT CONTRAST CT CERVICAL SPINE WITHOUT  CONTRAST TECHNIQUE: Multidetector CT imaging of the head and cervical spine was performed following the standard protocol without intravenous contrast. Multiplanar CT image reconstructions of the cervical spine were also generated. RADIATION DOSE REDUCTION: This exam was performed according to the departmental dose-optimization program which includes automated exposure control, adjustment of the mA and/or kV according to patient size and/or use of iterative reconstruction technique. COMPARISON:  CT Head and C Spine 03/21/22 FINDINGS: CT HEAD FINDINGS Brain: No hemorrhage. No hydrocephalus. No extra-axial fluid collection. No CT evidence of an acute cortical infarct. Mass effect. No mass lesion. Sequela of mild chronic microvascular ischemic change with generalized volume loss. Vascular: No hyperdense vessel or unexpected calcification. Skull: Soft swelling along the right parietal scalp. No evidence of an underlying calvarial fracture. Sinuses/Orbits: No middle ear or mastoid effusion. Paranasal sinuses are notable for mucosal thickening in the bilateral ethmoid and sphenoid sinuses. Orbits are unremarkable. Other: None. CT CERVICAL SPINE FINDINGS Alignment: Normal. Skull base and vertebrae: No acute fracture. No primary bone lesion or focal pathologic process. Soft tissues and spinal canal: No prevertebral fluid or swelling. No visible canal hematoma. Disc levels: There are multilevel degenerative changes with calcified disc bulges and ligamentum flavum hypertrophy that results in moderate to severe spinal canal narrowing at C2-C3 and at least moderate spinal canal narrowing at C3-C4. If with the patient has new onset myelopathic symptoms, further evaluation with a cervical spine MRI is recommended. Upper chest: Negative. Other: Status post thyroidectomy. IMPRESSION: 1. No acute intracranial abnormality. 2. Soft tissue swelling along the right parietal scalp. No evidence of an underlying calvarial fracture. 3. No  acute fracture or traumatic malalignment of the cervical spine. 4. Multilevel degenerative changes with calcified disc bulges and ligamentum flavum hypertrophy that results in moderate to severe spinal canal narrowing at C2-C3 and at least moderate spinal canal narrowing at C3-C4. If the patient has new onset myelopathic symptoms, further evaluation with a cervical spine MRI is recommended. Electronically Signed   By: Lorenza Cambridge M.D.   On: 04/13/2023 12:49   PROCEDURES: Critical Care performed: Yes, see critical care procedure note(s) .1-3 Lead EKG Interpretation  Performed by: Merwyn Katos, MD Authorized by: Merwyn Katos, MD     Interpretation: abnormal     ECG rate:  107   ECG rate assessment: tachycardic     Rhythm: sinus tachycardia     Ectopy: none     Conduction: normal   CRITICAL CARE Performed by: Merwyn Katos  Total critical care time: 37 minutes  Critical care time was exclusive of separately billable procedures and treating other patients.  Critical care was necessary to treat or prevent imminent or life-threatening deterioration.  Critical care was time spent personally by me  on the following activities: development of treatment plan with patient and/or surrogate as well as nursing, discussions with consultants, evaluation of patient's response to treatment, examination of patient, obtaining history from patient or surrogate, ordering and performing treatments and interventions, ordering and review of laboratory studies, ordering and review of radiographic studies, pulse oximetry and re-evaluation of patient's condition.  MEDICATIONS ORDERED IN ED: Medications  dextrose 10 % infusion ( Intravenous New Bag/Given 04/13/23 1537)  levothyroxine (SYNTHROID) tablet 50 mcg (has no administration in time range)  dapagliflozin propanediol (FARXIGA) tablet 10 mg (has no administration in time range)  lactase (LACTAID) tablet 3,000 Units (has no administration in time range)   metoCLOPramide (REGLAN) tablet 10 mg (has no administration in time range)  psyllium (REGULOID) capsule 0.52 g (has no administration in time range)  fesoterodine (TOVIAZ) tablet 4 mg (has no administration in time range)  gabapentin (NEURONTIN) capsule 200 mg (has no administration in time range)  calcium carbonate (OS-CAL - dosed in mg of elemental calcium) tablet 1,250 mg (has no administration in time range)  lidocaine (XYLOCAINE) 1 % (with pres) injection 5 mL (has no administration in time range)  sodium chloride flush (NS) 0.9 % injection 3 mL (has no administration in time range)  sodium chloride flush (NS) 0.9 % injection 3 mL (has no administration in time range)  0.9 %  sodium chloride infusion (has no administration in time range)  acetaminophen (TYLENOL) tablet 650 mg (has no administration in time range)  docusate sodium (COLACE) capsule 100 mg (has no administration in time range)  polyethylene glycol (MIRALAX / GLYCOLAX) packet 17 g (has no administration in time range)  ondansetron (ZOFRAN) injection 4 mg (has no administration in time range)  heparin injection 5,000 Units (has no administration in time range)  oxyCODONE-acetaminophen (PERCOCET) 7.5-325 MG per tablet 1 tablet (has no administration in time range)  ketorolac (TORADOL) 30 MG/ML injection 30 mg (has no administration in time range)  dextrose 50 % solution 50 mL (50 mLs Intravenous Given 04/13/23 1142)  HYDROcodone-acetaminophen (NORCO/VICODIN) 5-325 MG per tablet 1 tablet (1 tablet Oral Given 04/13/23 1542)   IMPRESSION / MDM / ASSESSMENT AND PLAN / ED COURSE  I reviewed the triage vital signs and the nursing notes.                             The patient is on the cardiac monitor to evaluate for evidence of arrhythmia and/or significant heart rate changes. Patient's presentation is most consistent with acute presentation with potential threat to life or bodily function. Patient is a 67 year old female that  presents for right hip pain, right shoulder pain, right-sided neck pain, and right upper extremity weakness Workup: XR hip, shoulder/head, C-spine CT Findings: Right inferior and superior pubic rami fracture without dislocation.  Patient also shows signs and symptoms of cervical canal stenosis with possible central cord syndrome due to decrease strength in right upper extremity Consult: Orthopedic Surgery (weightbearing as tolerated), hospitalist  Patient does not currently demonstrate complications of fracture such as compartment syndrome, arterial or nerve injury. Patient does have C spine compression seen on cervical CT.  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  I spoke to Dr. Katrinka Blazing in neurosurgery who recommends admission to the ICU for blood pressure control and management.  I spoke to Dr. Abelardo Diesel in the ICU who agrees to accept this patient on his service Interventions: analgesia Disposition: Admit  FINAL CLINICAL IMPRESSION(S) / ED DIAGNOSES   Final diagnoses:  Closed fracture of superior ramus of right pubis, initial encounter (HCC)  Inferior pubic ramus fracture, right, closed, initial encounter (HCC)   Rx / DC Orders   ED Discharge Orders     None      Note:  This document was prepared using Dragon voice recognition software and may include unintentional dictation errors.   Merwyn Katos, MD 04/13/23 [phone]

## 2023-04-13 NOTE — Plan of Care (Signed)
Patient admitted to [facility]-ICU overnight. Patient is receiving an active infusion of Levophed via PIV to maintain MAP > 85 mmHg (per CCM and Neurosurgery). Neuro exam per flowsheet.   Problem: Education: Goal: Ability to describe self-care measures that may prevent or decrease complications (Diabetes Survival Skills Education) will improve Outcome: Progressing Goal: Individualized Educational Video(s) Outcome: Progressing   Problem: Coping: Goal: Ability to adjust to condition or change in health will improve Outcome: Progressing   Problem: Fluid Volume: Goal: Ability to maintain a balanced intake and output will improve Outcome: Progressing   Problem: Health Behavior/Discharge Planning: Goal: Ability to identify and utilize available resources and services will improve Outcome: Progressing Goal: Ability to manage health-related needs will improve Outcome: Progressing   Problem: Metabolic: Goal: Ability to maintain appropriate glucose levels will improve Outcome: Progressing   Problem: Nutritional: Goal: Maintenance of adequate nutrition will improve Outcome: Progressing Goal: Progress toward achieving an optimal weight will improve Outcome: Progressing   Problem: Skin Integrity: Goal: Risk for impaired skin integrity will decrease Outcome: Progressing   Problem: Tissue Perfusion: Goal: Adequacy of tissue perfusion will improve Outcome: Progressing   Problem: Education: Goal: Knowledge of General Education information will improve Description: Including pain rating scale, medication(s)/side effects and non-pharmacologic comfort measures Outcome: Progressing   Problem: Health Behavior/Discharge Planning: Goal: Ability to manage health-related needs will improve Outcome: Progressing   Problem: Clinical Measurements: Goal: Ability to maintain clinical measurements within normal limits will improve Outcome: Progressing Goal: Will remain free from infection Outcome:  Progressing Goal: Diagnostic test results will improve Outcome: Progressing Goal: Respiratory complications will improve Outcome: Progressing Goal: Cardiovascular complication will be avoided Outcome: Progressing   Problem: Activity: Goal: Risk for activity intolerance will decrease Outcome: Progressing   Problem: Nutrition: Goal: Adequate nutrition will be maintained Outcome: Progressing   Problem: Coping: Goal: Level of anxiety will decrease Outcome: Progressing   Problem: Elimination: Goal: Will not experience complications related to bowel motility Outcome: Progressing Goal: Will not experience complications related to urinary retention Outcome: Progressing   Problem: Pain Managment: Goal: General experience of comfort will improve Outcome: Progressing   Problem: Safety: Goal: Ability to remain free from injury will improve Outcome: Progressing   Problem: Skin Integrity: Goal: Risk for impaired skin integrity will decrease Outcome: Progressing

## 2023-04-13 NOTE — Consult Note (Signed)
Consulting Department:  Emergency department  Primary Physician:  Jerl Mina, MD  Chief Complaint: Cervical spinal cord injury  History of Present Illness: 04/13/2023 [name] is a 67 y.o. female who presents with the chief complaint of cervical spinal cord injury after a fall.  She was in her usual state of health until earlier today around 10:00 when she was helping a friend and had a fall backwards striking her head.  She does not remember the fall or whether or not she had a loss of consciousness.  She developed burning pain in bilateral upper extremities but right worse than left.  She also has some burning pain in her feet as well.  He has a history of diabetes and diabetic neuropathy.  Was presenting with a cervical collar.  She states that she has not noticed any worsening in her strength but does notice that her right arm and leg feel weak compared to the contralateral side.  She does state that this is new since her recent fall.  She is unsure whether or not she is having any bowel or bladder issues.  She has not been previously treated for cervical myelopathy.  She has had a previous anterior neck surgery for thyroid removal and has recently fallen and fractured her clavicle.   Review of Systems:  A 10 point review of systems is negative, except for the pertinent positives and negatives detailed in the HPI.  Past Medical History: Past Medical History:  Diagnosis Date   Arthritis    Blood dyscrasia    patient stated she is a "free bleeder"   Diabetes mellitus without complication (HCC)    Diverticulosis    Hypothyroidism    Neuropathy    Renal insufficiency     Past Surgical History: Past Surgical History:  Procedure Laterality Date   ABDOMINAL HYSTERECTOMY     BILATERAL SALPINGECTOMY Bilateral 03/16/2017   Procedure: BILATERAL SALPINGECTOMY;  Surgeon: Schermerhorn, Ihor Austin, MD;  Location: [facility] ORS;  Service: Gynecology;  Laterality: Bilateral;   COLONOSCOPY  WITH PROPOFOL N/A 08/18/2018   Procedure: COLONOSCOPY WITH PROPOFOL;  Surgeon: Scot Jun, MD;  Location: [facility] ENDOSCOPY;  Service: Endoscopy;  Laterality: N/A;   CYSTOCELE REPAIR N/A 03/16/2017   Procedure: ANTERIOR REPAIR (CYSTOCELE);  Surgeon: Schermerhorn, Ihor Austin, MD;  Location: [facility] ORS;  Service: Gynecology;  Laterality: N/A;   ESOPHAGOGASTRODUODENOSCOPY (EGD) WITH PROPOFOL N/A 08/18/2018   Procedure: ESOPHAGOGASTRODUODENOSCOPY (EGD) WITH PROPOFOL;  Surgeon: Scot Jun, MD;  Location: [facility] ENDOSCOPY;  Service: Endoscopy;  Laterality: N/A;   TEAR DUCT PROBING Right    TUBAL LIGATION     VAGINAL HYSTERECTOMY N/A 03/16/2017   Procedure: HYSTERECTOMY VAGINAL;  Surgeon: Schermerhorn, Ihor Austin, MD;  Location: [facility] ORS;  Service: Gynecology;  Laterality: N/A;   WISDOM TOOTH EXTRACTION      Allergies: Allergies as of 04/13/2023 - Review Complete 04/13/2023  Allergen Reaction Noted   Augmentin [amoxicillin-pot clavulanate] Itching 08/11/2021   Ampicillin Rash 04/13/2023    Medications:  Current Facility-Administered Medications:    [START ON 04/14/2023] calcium carbonate (OS-CAL - dosed in mg of elemental calcium) tablet 1,250 mg, 1 tablet, Oral, Q breakfast, Acheampong, Genice Rouge, MD   Melene Muller ON 04/14/2023] dapagliflozin propanediol (FARXIGA) tablet 10 mg, 10 mg, Oral, Daily, Acheampong, Genice Rouge, MD   dextrose 10 % infusion, , Intravenous, Continuous, Bradler, Clent Jacks, MD, Last Rate: 75 mL/hr at 04/13/23 1537, New Bag at 04/13/23 1537   fesoterodine (TOVIAZ) tablet 4 mg, 4 mg, Oral, Daily, Acheampong,  Genice Rouge, MD   gabapentin (NEURONTIN) capsule 200 mg, 200 mg, Oral, BID, Acheampong, Genice Rouge, MD   [START ON 04/14/2023] lactase (LACTAID) tablet 3,000 Units, 3,000 Units, Oral, QAC breakfast, Acheampong, Genice Rouge, MD   levothyroxine (SYNTHROID) tablet 50 mcg, 50 mcg, Oral, Daily, Acheampong, Genice Rouge, MD   metoCLOPramide (REGLAN) tablet 10 mg, 10 mg, Oral, Q8H PRN, Acheampong, Genice Rouge,  MD   psyllium (REGULOID) capsule 0.52 g, 0.52 g, Oral, Daily, Acheampong, Genice Rouge, MD  Current Outpatient Medications:    calcium carbonate (OS-CAL - DOSED IN MG OF ELEMENTAL CALCIUM) 1250 (500 Ca) MG tablet, Take 1 tablet by mouth daily with breakfast., Disp: , Rfl:    dapagliflozin propanediol (FARXIGA) 10 MG TABS tablet, Take 10 mg by mouth daily., Disp: , Rfl:    gabapentin (NEURONTIN) 100 MG capsule, Take 200 mg by mouth 2 (two) times daily. , Disp: , Rfl:    glipiZIDE (GLUCOTROL) 5 MG tablet, Take 5 mg by mouth 2 (two) times daily before a meal. , Disp: , Rfl:    insulin glargine (LANTUS) 100 UNIT/ML Solostar Pen, Inject 14 Units into the skin at bedtime., Disp: , Rfl:    lactase (LACTAID) 3000 units tablet, Take 3,000 Units by mouth daily before breakfast., Disp: , Rfl:    levothyroxine (SYNTHROID) 50 MCG tablet, Take 50 mcg by mouth daily., Disp: , Rfl: 11   metFORMIN (GLUCOPHAGE-XR) 500 MG 24 hr tablet, Take 1,000 mg by mouth daily. , Disp: , Rfl:    metoCLOPramide (REGLAN) 10 MG tablet, Take 1 tablet (10 mg total) by mouth every 8 (eight) hours as needed for nausea or vomiting., Disp: 20 tablet, Rfl: 1   omeprazole (PRILOSEC) 40 MG capsule, Take 1 capsule (40 mg total) by mouth 2 (two) times daily. Take 30 min after Synthroid and 30 min before breakfast, Disp: 60 capsule, Rfl: 2   ondansetron (ZOFRAN-ODT) 4 MG disintegrating tablet, Take 1 tablet (4 mg total) by mouth every 6 (six) hours as needed for nausea or vomiting., Disp: 20 tablet, Rfl: 0   oxyCODONE-acetaminophen (PERCOCET/ROXICET) 5-325 MG tablet, Take 2 tablets by mouth every 6 (six) hours as needed., Disp: 20 tablet, Rfl: 0   psyllium (REGULOID) 0.52 g capsule, Take 0.52 g by mouth daily., Disp: , Rfl:    solifenacin (VESICARE) 10 MG tablet, Take 10 mg by mouth daily., Disp: , Rfl:    Social History: Social History   Tobacco Use   Smoking status: Never   Smokeless tobacco: Never  Vaping Use   Vaping status: Never Used   Substance Use Topics   Alcohol use: No   Drug use: No    Family Medical History: Family History  Problem Relation Age of Onset   Breast cancer Maternal Aunt    Hypertension Mother    Hypertension Father    Diabetes Father     Physical Examination: Vitals:   04/13/23 1112 04/13/23 1544  BP: (!) 148/68 128/62  Pulse: 92 (!) 104  Resp: 20 20  Temp: 97.6 F (36.4 C) 97.7 F (36.5 C)  SpO2: 93% 93%     General: Patient is well developed, well nourished, calm, collected, and in no apparent distress.  NEUROLOGICAL:  General: In no acute distress.   Awake, alert, oriented to person and place, she is somewhat distracted by pain.  Pupils equal round and reactive to light.  Facial tone is symmetric.  Tongue protrusion is midline.    Strength: Her motor exam is severely limited  by pain, she is at least antigravity with both elbow flexion and extension.  Her intrinsic hand musculature shows approximately 2 out of 5 when compared to the contralateral side.  She has limited finger spread, her grip is approximately 3-4 out of 5.  Her left upper and lower extremity are antigravity without clear evidence of deficits.  Her right lower extremity is somewhat clouded by a pelvic fracture, however distally she is 5 out of 5 with the only significant tested deficit noted was in EHL weakness of approximately 3 out of 5.  She has burning dysesthesias in the bilateral upper extremities and lower extremities.  This appears to be in a cape like distribution.  Deep tendon reflexes are difficult to test given her severe pain, however she does appear to be hyperreflexic in both the bilateral biceps, she does have positive Hoffmann sign bilaterally.  She has spreading reflexes at the bilateral Achilles.  Her tone is too high to perform full clonus testing, however she does appear to have upgoing toes bilaterally on Babinski testing.  Imaging: Narrative & Impression  CLINICAL DATA:  Neck trauma.  Ligamentous injury suspected. Fall with trauma to the head and neck.   EXAM: MRI CERVICAL SPINE WITHOUT CONTRAST   TECHNIQUE: Multiplanar, multisequence MR imaging of the cervical spine was performed. No intravenous contrast was administered.   COMPARISON:  CT earlier same day   FINDINGS: Alignment: No malalignment.   Vertebrae: No evidence of regional fracture. No sign of anterior or posterior ligamentous injury by MRI.   Cord: No sign of acute cord injury. See below regarding stenosis with chronic cord deformity.   Posterior Fossa, vertebral arteries, paraspinal tissues: No traumatic regional finding.   Disc levels:   Foramen magnum is widely patent. 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   C2-3: Endplate osteophytes and bulging of the disc. Posterior ligamentous prominence. Severe spinal stenosis with AP diameter of the canal in the midline as narrow as 4.7 mm. Effacement of the subarachnoid space and deformity of the cord. Patient would be at risk of compressive myelopathy. Mild bilateral foraminal narrowing.   C3-4: Endplate osteophytes and bulging of the disc. Posterior ligamentous prominence. Spinal stenosis with AP diameter of the canal in the midline as narrow as 4.5 mm. Effacement of the subarachnoid space and deformity of the cord. This would also place the patient at risk of compressive myelopathy. There is moderate bilateral foraminal stenosis, left worse than right, with contributions from facet degeneration.   C4-5: Endplate osteophytes and bulging of the disc. Mild posterior ligamentous prominence. Canal narrowing with AP diameter in the midline as narrow as 7 mm. Narrowing of the subarachnoid space with slight indentation of the cord. Bilateral foraminal stenosis.   C5-6: Endplate osteophytes and bulging of the disc. Mild posterior ligamentous prominence. Minimal diameter of the canal in the  midline 7.8 mm. No cord deformity. No significant foraminal stenosis.   C6-7: Bulging of the disc.  No canal or foraminal stenosis.   C7-T1: Bulging of the disc.  No canal or foraminal stenosis.   IMPRESSION: 1. No acute traumatic finding by MRI. No fracture or evidence of ligamentous injury. 2. Severe spinal stenosis at C2-3 and C3-4 due to endplate osteophytes, bulging of the disc and posterior ligamentous prominence. AP diameter of the canal in the midline as narrow as 4.5- 4.7 mm. Effacement of the subarachnoid space and deformity of the cord. Patient would be at risk of compressive myelopathy. Bilateral  foraminal stenosis also present at these levels. 3. Moderate spinal stenosis at C4-5 and C5-6 due to endplate osteophytes, bulging of the disc and posterior ligamentous prominence. Narrowing of the subarachnoid space with slight indentation of the cord at C4-5. Bilateral foraminal stenosis at C4-5.     Electronically Signed   By: Paulina Fusi M.D.   On: 04/13/2023 15:40     Narrative & Impression  CLINICAL DATA:  Head trauma, minor (Age >= 65y); Neck trauma (Age >= 65y)   EXAM: CT HEAD WITHOUT CONTRAST   CT CERVICAL SPINE WITHOUT CONTRAST   TECHNIQUE: Multidetector CT imaging of the head and cervical spine was performed following the standard protocol without intravenous contrast. Multiplanar CT image reconstructions of the cervical spine were also generated.   RADIATION DOSE REDUCTION: This exam was performed according to the departmental dose-optimization program which includes automated exposure control, adjustment of the mA and/or kV according to patient size and/or use of iterative reconstruction technique.   COMPARISON:  CT Head and C Spine 03/21/22   FINDINGS: CT HEAD FINDINGS   Brain: No hemorrhage. No hydrocephalus. No extra-axial fluid collection. No CT evidence of an acute cortical infarct. Mass effect. No mass lesion. Sequela of mild chronic  microvascular ischemic change with generalized volume loss.   Vascular: No hyperdense vessel or unexpected calcification.   Skull: Soft swelling along the right parietal scalp. No evidence of an underlying calvarial fracture.   Sinuses/Orbits: No middle ear or mastoid effusion. Paranasal sinuses are notable for mucosal thickening in the bilateral ethmoid and sphenoid sinuses. Orbits are unremarkable.   Other: None.   CT CERVICAL SPINE FINDINGS   Alignment: Normal.   Skull base and vertebrae: No acute fracture. No primary bone lesion or focal pathologic process.   Soft tissues and spinal canal: No prevertebral fluid or swelling. No visible canal hematoma.   Disc levels: There are multilevel degenerative changes with calcified disc bulges and ligamentum flavum hypertrophy that results in moderate to severe spinal canal narrowing at C2-C3 and at least moderate spinal canal narrowing at C3-C4. If with the patient has new onset myelopathic symptoms, further evaluation with a cervical spine MRI is recommended.   Upper chest: Negative.   Other: Status post thyroidectomy.   IMPRESSION: 1. No acute intracranial abnormality. 2. Soft tissue swelling along the right parietal scalp. No evidence of an underlying calvarial fracture. 3. No acute fracture or traumatic malalignment of the cervical spine. 4. Multilevel degenerative changes with calcified disc bulges and ligamentum flavum hypertrophy that results in moderate to severe spinal canal narrowing at C2-C3 and at least moderate spinal canal narrowing at C3-C4. If the patient has new onset myelopathic symptoms, further evaluation with a cervical spine MRI is recommended.     Electronically Signed   By: Lorenza Cambridge M.D.   On: 04/13/2023 12:49    I have personally reviewed the images and agree with the above interpretation.  Labs:    Latest Ref Rng & Units 04/13/2023   11:34 AM 08/21/2022    1:23 PM 10/22/2021    2:30  PM  CBC  WBC 4.0 - 10.5 K/uL 5.7  6.0  5.7   Hemoglobin 12.0 - 15.0 g/dL 9.8  30.8  65.7   Hematocrit 36.0 - 46.0 % 29.2  34.7  34.2   Platelets 150 - 400 K/uL 181  154  204        Assessment and Plan: [name] is a pleasant 67 y.o. female with a history of  diabetes and a recent fall striking her head and establishing a cervical spinal cord injury.  This is mostly in a central versus hemicord pattern.  Given her previous significant cervical stenosis noted on her MRI this likely puts her at high risk for a central cord injury with a flexion mechanism given the back of the head strike.  She has severe burning dysesthesias in her bilateral upper extremities right worse than left as well as in her bilateral lower extremities right worse than left  She has some notable hand weakness, she also has weakness in right hip flexion, however this is guarded by a pelvis fracture.  Distally she has some slight EHL weakness on the right side but her plantarflexion and dorsiflexion appear to be similar bilaterally.  On her imaging she demonstrates cervical stenosis which appears to be chronic, this is worse from the C2-3 junction to the C4-5 junction.  This likely puts her at risk for a cervical spinal cord injury of a central cord type.  Recommendations: We discussed acute versus delayed surgical management of the spinal cord injury.  I let the patient and family know that there is disagreement among the surgical community on whether or not surgery should be done acutely or in a delayed fashion.  I let them know that I often treat these acutely, however there are some risks inherent to that.  They would like to have her treated medically and have surgery when she is in a less acute stage of injury/illness.  I discussed that I will follow her during her admission to make sure that she does not have any progressive worsening. From a spinal cord injury management standpoint, she can remove her cervical collar  while eating or while in bed.  MAP goals should be maintained greater than 85, continue this in the ICU setting for at least 48 hours, should she auto map and remained stable she could be transferred to a stepdown unit after that point.  Continue every 2 hours neuroexams, physical therapy occupational therapy evaluation.  No role for steroids given her age, and diabetes, there are some contraindications to this intervention in this patient population.  It is okay to turn the patient intermittently to avoid development of bedsores, however we do not recommend strict bedrest.   [name], MD/MSCR Dept. of Neurosurgery

## 2023-04-13 NOTE — ED Notes (Signed)
This RN transported patient on monitor from CT to ICU. Verbal report given to Morrie Sheldon, [place]

## 2023-04-14 DIAGNOSIS — G9520 Unspecified cord compression: Secondary | ICD-10-CM | POA: Diagnosis not present

## 2023-04-14 DIAGNOSIS — G950 Syringomyelia and syringobulbia: Secondary | ICD-10-CM | POA: Diagnosis not present

## 2023-04-14 DIAGNOSIS — S14129A Central cord syndrome at unspecified level of cervical spinal cord, initial encounter: Secondary | ICD-10-CM | POA: Diagnosis not present

## 2023-04-14 DIAGNOSIS — M4802 Spinal stenosis, cervical region: Secondary | ICD-10-CM | POA: Diagnosis not present

## 2023-04-14 DIAGNOSIS — R29898 Other symptoms and signs involving the musculoskeletal system: Secondary | ICD-10-CM | POA: Diagnosis not present

## 2023-04-14 LAB — URINALYSIS, ROUTINE W REFLEX MICROSCOPIC
Bacteria, UA: NONE SEEN
Bilirubin Urine: NEGATIVE
Glucose, UA: >=500 mg/dL — AB
Ketones, ur: NEGATIVE mg/dL
Leukocytes,Ua: NEGATIVE
Nitrite: NEGATIVE
Protein, ur: NEGATIVE mg/dL
Specific Gravity, Urine: 1.008 (ref 1.005–1.030)
pH: 5 (ref 5.0–8.0)

## 2023-04-14 LAB — GLUCOSE, CAPILLARY
Glucose-Capillary: 112 mg/dL — ABNORMAL HIGH (ref 70–99)
Glucose-Capillary: 115 mg/dL — ABNORMAL HIGH (ref 70–99)
Glucose-Capillary: 116 mg/dL — ABNORMAL HIGH (ref 70–99)
Glucose-Capillary: 124 mg/dL — ABNORMAL HIGH (ref 70–99)
Glucose-Capillary: 129 mg/dL — ABNORMAL HIGH (ref 70–99)
Glucose-Capillary: 131 mg/dL — ABNORMAL HIGH (ref 70–99)
Glucose-Capillary: 147 mg/dL — ABNORMAL HIGH (ref 70–99)
Glucose-Capillary: 154 mg/dL — ABNORMAL HIGH (ref 70–99)
Glucose-Capillary: 157 mg/dL — ABNORMAL HIGH (ref 70–99)
Glucose-Capillary: 161 mg/dL — ABNORMAL HIGH (ref 70–99)
Glucose-Capillary: 175 mg/dL — ABNORMAL HIGH (ref 70–99)

## 2023-04-14 LAB — BASIC METABOLIC PANEL
Anion gap: 4 — ABNORMAL LOW (ref 5–15)
BUN: 29 mg/dL — ABNORMAL HIGH (ref 8–23)
CO2: 22 mmol/L (ref 22–32)
Calcium: 8.2 mg/dL — ABNORMAL LOW (ref 8.9–10.3)
Chloride: 112 mmol/L — ABNORMAL HIGH (ref 98–111)
Creatinine, Ser: 1.27 mg/dL — ABNORMAL HIGH (ref 0.44–1.00)
GFR, Estimated: 47 mL/min — ABNORMAL LOW (ref >60)
Glucose, Bld: 149 mg/dL — ABNORMAL HIGH (ref 70–99)
Potassium: 3.6 mmol/L (ref 3.5–5.1)
Sodium: 138 mmol/L (ref 135–145)

## 2023-04-14 LAB — CBC
HCT: 26.4 % — ABNORMAL LOW (ref 36.0–46.0)
Hemoglobin: 9.2 g/dL — ABNORMAL LOW (ref 12.0–15.0)
MCH: 29.5 pg (ref 26.0–34.0)
MCHC: 34.8 g/dL (ref 30.0–36.0)
MCV: 84.6 fL (ref 80.0–100.0)
Platelets: 192 10*3/uL (ref 150–400)
RBC: 3.12 MIL/uL — ABNORMAL LOW (ref 3.87–5.11)
RDW: 14.3 % (ref 11.5–15.5)
WBC: 6.8 10*3/uL (ref 4.0–10.5)
nRBC: 0 % (ref 0.0–0.2)

## 2023-04-14 LAB — CORTISOL-AM, BLOOD: Cortisol - AM: 17.7 ug/dL (ref 6.7–22.6)

## 2023-04-14 LAB — HIV ANTIBODY (ROUTINE TESTING W REFLEX): HIV Screen 4th Generation wRfx: NONREACTIVE

## 2023-04-14 LAB — PHOSPHORUS: Phosphorus: 3.4 mg/dL (ref 2.5–4.6)

## 2023-04-14 LAB — MAGNESIUM: Magnesium: 2.1 mg/dL (ref 1.7–2.4)

## 2023-04-14 MED ORDER — ADULT MULTIVITAMIN W/MINERALS CH
1.0000 | ORAL_TABLET | Freq: Every day | ORAL | Status: DC
  Administered 2023-04-15 – 2023-04-22 (×7): 1 via ORAL
  Filled 2023-04-14 (×8): qty 1

## 2023-04-14 MED ORDER — ROSUVASTATIN CALCIUM 10 MG PO TABS
5.0000 mg | ORAL_TABLET | Freq: Every day | ORAL | Status: DC
  Administered 2023-04-14 – 2023-04-22 (×8): 5 mg via ORAL
  Filled 2023-04-14 (×9): qty 1

## 2023-04-14 MED ORDER — LACTATED RINGERS IV BOLUS
500.0000 mL | Freq: Once | INTRAVENOUS | Status: AC
  Administered 2023-04-14: 500 mL via INTRAVENOUS

## 2023-04-14 MED ORDER — LEVOTHYROXINE SODIUM 100 MCG PO TABS
100.0000 ug | ORAL_TABLET | Freq: Every day | ORAL | Status: DC
  Administered 2023-04-15 – 2023-04-22 (×8): 100 ug via ORAL
  Filled 2023-04-14 (×8): qty 1

## 2023-04-14 MED ORDER — ENSURE ENLIVE PO LIQD
237.0000 mL | Freq: Two times a day (BID) | ORAL | Status: DC
  Administered 2023-04-14 – 2023-04-19 (×6): 237 mL via ORAL

## 2023-04-14 NOTE — Plan of Care (Signed)
  Problem: Metabolic: Goal: Ability to maintain appropriate glucose levels will improve Outcome: Progressing   Problem: Nutritional: Goal: Maintenance of adequate nutrition will improve Outcome: Progressing   Problem: Pain Managment: Goal: General experience of comfort will improve Outcome: Progressing

## 2023-04-14 NOTE — Progress Notes (Signed)
OT Cancellation Note  Patient Details Name: [name] MRN: [id number] DOB: 30-Nov-1955   Cancelled Treatment:    Reason Eval/Treat Not Completed: Other (comment) per chart pt started on levophed at 2000 10/7, per nurse not able to be weaned at this time, per protocol OT will hold for 24 hrs unless otherwise indicated by the team.   Oleta Mouse, OTD OTR/L  04/14/23, 8:59 AM

## 2023-04-14 NOTE — Inpatient Diabetes Management (Signed)
Inpatient Diabetes Program Recommendations  AACE/ADA: New Consensus Statement on Inpatient Glycemic Control (2015)  Target Ranges:  Prepandial:   less than 140 mg/dL      Peak postprandial:   less than 180 mg/dL (1-2 hours)      Critically ill patients:  140 - 180 mg/dL   Lab Results  Component Value Date   GLUCAP 116 (H) 04/14/2023   HGBA1C 5.9 (H) 04/13/2023    Latest Reference Range & Units 04/14/23 06:06 04/14/23 08:15 04/14/23 10:03 04/14/23 11:57  Glucose-Capillary 70 - 99 mg/dL 301 (H) 601 (H) 093 (H) 116 (H)  (H): Data is abnormally high  Review of Glycemic Control  Diabetes history: DM2 Outpatient Diabetes medications: Glucotrol 5 mg daily, Mounjaro 2.5 mg weekly Current orders for Inpatient glycemic control: CBGs only  Inpatient Diabetes Program Recommendations:   Spoke with patient and husband @ bedside regarding hypoglycemia @ home.patient no longer takes Lantus, Farxiga or Metformin. Patient has lost approximately 20 lbs over the past 2 months although she has been on [facility] for longer time. Patient has had events of hypoglycemia including yesterday morning of CBG 51 which is shown by A1c of 5.9.  Patient ate small amount of breakfast and went into work after taking her Glucotrol 5 mg. Reviewed hypoglycemia protocol with patient and discussed how Glucotrol works to lower CBG. Patient has been eating 2-3 bites of a biscuit from [employer] for breakfast and has had very little appetite.  Patient may not need any oral meds @ discharge home along with her Mounjaro.  Thank you, Billy Fischer. Velia Pamer, RN, MSN, CDE  Diabetes Coordinator Inpatient Glycemic Control Team Team Pager [phone] (8am-5pm) 04/14/2023 2:07 PM

## 2023-04-14 NOTE — Progress Notes (Signed)
PT Cancellation Note  Patient Details Name: [name] MRN: [id number] DOB: [birth year]   Cancelled Treatment:    Reason Eval/Treat Not Completed: Patient not medically ready. Communicated with care team via secure chat and deemed Pt not medically cleared for mobility at this time due to not weaning off levo. Will re-attempt PT evaluation at a later date.   Leomar Westberg 04/14/2023, 8:58 AM

## 2023-04-14 NOTE — Progress Notes (Signed)
NAME:  [name], MRN:  [id number], DOB:  02-01-1956, LOS: 1 ADMISSION DATE:  04/13/2023, CONSULTATION DATE: 04/13/2023 REFERRING MD: Dr. Vicente Males, CHIEF COMPLAINT: Fall   History of Present Illness:  67 yo female who presented to [facility] ER via EMS on 10/7 following a fall.  Per ER notes she was helping a friend and fell backwards.  She reports no memory of the fall but there is a question of if she loss consciousness.  EMS placed pt in a C-Collar.  Following the fall she developed a burning pain in her bilateral upper extremities which is worse on the right. She also had some burning pain in her feet.   ED Course  Upon arrival to the ER pt c/o having neck/head and right lower extremity pain.  She also endorsed RUE/RLE weakness when compared to the left side which is new.  Pt found to be hypoglycemic CBG 61, she received 1 amp of D50W followed by D10 infusion at 75 ml/hr.  Right hip x-ray revealed acute fractures of the right superior and inferior pubic rami.  Orthopedic surgery consulted by EDP and recommended weightbearing as tolerated.  MRI Cervical Spine concerning for cervical spinal cord injury.  Neurosurgeon Dr. Katrinka Blazing consulted by EDP.  He discussed the following options with pt and her family:  acute vs. delayed surgical management of the spinal cord injury. Following discussion pt and family decided to treat the spinal cord injury medically and have surgery when she is in a less acute stage of injury/illness.  PCCM team contacted for ICU admission for vasopressor to maintain map greater than 85 and placement of arterial line for closer bp monitoring.    Significant labs: chloride 115/CO2 19/BUN 32/creatinine 1.46/calcium 8.6/albumin 3.4/hgb 9.8  CT Head/Cervical Spine:  No acute intracranial abnormality. Soft tissue swelling along the right parietal scalp. No evidence of an underlying calvarial fracture. No acute fracture or traumatic malalignment of the cervical spine. Multilevel degenerative  changes with calcified disc bulges and ligamentum flavum hypertrophy that results in moderate to severe spinal canal narrowing at C2-C3 and at least moderate spinal canal narrowing at C3-C4. If the patient has new onset myelopathic symptoms, further evaluation with a cervical spine MRI is recommended.  MR Cervical Spine: No acute traumatic finding by MRI. No fracture or evidence of ligamentous injury. Severe spinal stenosis at C2-3 and C3-4 due to endplate osteophytes, bulging of the disc and posterior ligamentous prominence. AP diameter of the canal in the midline as narrow as 4.5- 4.7 mm. Effacement of the subarachnoid space and deformity of the cord. Patient would be at risk of compressive myelopathy. Bilateral foraminal stenosis also present at these levels. Moderate spinal stenosis at C4-5 and C5-6 due to endplate osteophytes, bulging of the disc and posterior ligamentous prominence. Narrowing of the subarachnoid space with slight indentation of the cord at C4-5. Bilateral foraminal stenosis at C4-5.  Pertinent  Medical History  Arthritis  Blood Dyscrasia  Type II Diabetes Mellitus  Diverticulosis  Hypothyroidism Neuropathy Colon Cancer  Stage III CKD  Multinodular Goiter s/p Total Thyroidectomy   Chronic Cough   Significant Hospital Events: Including procedures, antibiotic start and stop dates in addition to other pertinent events   10/7: Pt admitted to ICU with a cervical spinal cord injury following a fall treating medically for now per pt and pts family request with vasopressor to maintain map greater than 85 and for arterial line placement for closer bp monitoring  10/8 +spinal cord injury   Interim History /  Subjective:  +cervical and pelvic pain +urinary retention Art line placed Started levo to keep MAP goal >85   Objective   Blood pressure (!) 156/50, pulse 97, temperature 98.2 F (36.8 C), temperature source Axillary, resp. rate 16, height 5\' 6"  (1.676 m), weight 63.9  kg, SpO2 91%.        Intake/Output Summary (Last 24 hours) at 04/14/2023 0711 Last data filed at 04/14/2023 0600 Gross per 24 hour  Intake 1592.12 ml  Output 1120 ml  Net 472.12 ml   Filed Weights   04/13/23 1057 04/13/23 1905 04/14/23 0415  Weight: 70.3 kg 62.8 kg 63.9 kg      Review of Systems: Gen:  Denies  fever, sweats, chills weight loss  HEENT: Denies blurred vision, double vision, ear pain, eye pain, hearing loss, nose bleeds, sore throat Cardiac:  No dizziness, chest pain or heaviness, chest tightness,edema, No JVD Resp:   No cough, -sputum production, -shortness of breath,-wheezing, -hemoptysis,  Other:  All other systems negative   Physical Examination:   General Appearance: No distress  EYES PERRLA, EOM intact.   NECK Supple, No JVD Pulmonary: normal breath sounds, No wheezing.  CardiovascularNormal S1,S2.  No m/r/g.   Abdomen: Benign, Soft, non-tender. Neurology motor strength right RUE 3/5 strength Ext pulses intact, cap refill intact ALL OTHER ROS ARE NEGATIVE   Assessment & Plan:   67 yo white female with acute traumatic 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: per neurosurgery due to previous significant cervical stenosis noted on MRI this likely puts her at high risk for a central cord injury with a flexion mechanism given the back of the head strike, severe hypoglycemia likely cause of fall Severe burning dysesthesias of BUE and BLE worse on the right Hx: Neuropathy  NEURO SX consulted Recs-MAP goal >85 on pressors now may remove cervical collar while eating or laying in bed; neuro exams q2 hrs; PT/OT evaluation; and DOES NOT recommend strict bedrest  - Continue outpatient gabapentin  Acute urinary retention due to cervical injury Patient will likely need surgical decompression   Acute fractures of the right superior and inferior pubic rami - Orthopedic consulted recommended weightbearing as tolerated    Acute pain  - Prn percocet and  toradol for pain management    CKD stage III -continue Foley Catheter-assess need -Avoid nephrotoxic agents -Follow urine output, BMP -Ensure adequate renal perfusion, optimize oxygenation -Renal dose medications   Intake/Output Summary (Last 24 hours) at 04/14/2023 0716 Last data filed at 04/14/2023 0700 Gross per 24 hour  Intake 1636.85 ml  Output 1120 ml  Net 516.85 ml    ACUTE ANEMIA- TRANSFUSE AS NEEDED CONSIDER TRANSFUSION  IF HGB<7    ENDO - ICU hypoglycemic\Hyperglycemia protocol -check FSBS per protocol Hypothyroidism  - Continue outpatient synthroid    GI GI PROPHYLAXIS as indicated  NUTRITIONAL STATUS DIET--> as tolerated Constipation protocol as indicated   ELECTROLYTES -follow labs as needed -replace as needed -pharmacy consultation and following   Hypoglycemia  Hx: Type II diabetes mellitus  - CBG's q1hr for now  - Continue D10W infusion for now  - Follow hypo/hyperglycemic protocol  - Hold outpatient glipizide, farxiga, metformin, and lantus for now   Best Practice (right click and "Reselect all SmartList Selections" daily)   Diet/type: Regular consistency (see orders) DVT prophylaxis: prophylactic heparin  GI prophylaxis: N/A Lines: Pending arterial line placement  Foley: Indwelling foley catheter placement pending  Code Status:  full code   10/7: Updated pt regarding plan of  care and prognosis  Labs   CBC: Recent Labs  Lab 04/13/23 1134 04/14/23 0358  WBC 5.7 6.8  NEUTROABS 3.3  --   HGB 9.8* 9.2*  HCT 29.2* 26.4*  MCV 87.2 84.6  PLT 181 192    Basic Metabolic Panel: Recent Labs  Lab 04/13/23 1134 04/14/23 0358  NA 140 138  K 4.2 3.6  CL 115* 112*  CO2 19* 22  GLUCOSE 70 149*  BUN 32* 29*  CREATININE 1.46* 1.27*  CALCIUM 8.6* 8.2*  MG  --  2.1  PHOS  --  3.4   GFR: Estimated Creatinine Clearance: 41.3 mL/min (A) (by C-G formula based on SCr of 1.27 mg/dL (H)). Recent Labs  Lab 04/13/23 1134 04/14/23 0358   WBC 5.7 6.8    Liver Function Tests: Recent Labs  Lab 04/13/23 1134  AST 30  ALT 22  ALKPHOS 72  BILITOT 0.6  PROT 7.3  ALBUMIN 3.4*   No results for input(s): "LIPASE", "AMYLASE" in the last 168 hours. No results for input(s): "AMMONIA" in the last 168 hours.  ABG No results found for: "PHART", "PCO2ART", "PO2ART", "HCO3", "TCO2", "ACIDBASEDEF", "O2SAT"   Coagulation Profile: No results for input(s): "INR", "PROTIME" in the last 168 hours.  Cardiac Enzymes: No results for input(s): "CKTOTAL", "CKMB", "CKMBINDEX", "TROPONINI" in the last 168 hours.  HbA1C: Hemoglobin A1C  Date/Time Value Ref Range Status  02/20/2013 04:02 AM 10.3 (H) 4.2 - 6.3 % Final    Comment:    The American Diabetes Association recommends that a primary goal of therapy should be <7% and that physicians should reevaluate the treatment regimen in patients with HbA1c values consistently >8%.    Hgb A1c MFr Bld  Date/Time Value Ref Range Status  04/13/2023 11:34 AM 5.9 (H) 4.8 - 5.6 % Final    Comment:    (NOTE) Pre diabetes:          5.7%-6.4%  Diabetes:              >6.4%  Glycemic control for   <7.0% adults with diabetes   08/11/2021 08:39 PM 7.2 (H) 4.8 - 5.6 % Final    Comment:    (NOTE) Pre diabetes:          5.7%-6.4%  Diabetes:              >6.4%  Glycemic control for   <7.0% adults with diabetes     CBG: Recent Labs  Lab 04/13/23 2223 04/13/23 2358 04/14/23 0201 04/14/23 0414 04/14/23 0606  GLUCAP 178* 175* 161* 147* 157*     DVT/GI PRX  assessed I Assessed the need for Labs I Assessed the need for Foley I Assessed the need for Central Venous Line Family Discussion when available I Assessed the need for Mobilization I made an Assessment of medications to be adjusted accordingly Safety Risk assessment completed  CASE DISCUSSED IN MULTIDISCIPLINARY ROUNDS WITH ICU TEAM     Critical Care Time devoted to patient care services described in this note is 55  minutes.  Critical care was necessary to treat /prevent imminent and life-threatening deterioration.    Lucie Leather, M.D.  [facility]  Medical Director [facility] Medical Director [facility] Cardio-Pulmonary Department

## 2023-04-14 NOTE — Consult Note (Signed)
Attending Progress Note  History: [name] is here for a cervical spinal cord injury.  She was admitted to the intensive care unit for blood pressure management and serial examinations.  She remained mostly stable from a neurologic standpoint overnight, she did develop some episodes of urinary retention, was scanned for 500 x 2.  Physical Exam: Vitals:   04/14/23 1530 04/14/23 1600  BP: (!) 129/54 (!) 153/61  Pulse: 97 99  Resp: 18 16  Temp:  98.6 F (37 C)  SpO2: 94% 96%    AA Ox3 CNI  She continues to have notable weakness in the right hand in terms of grip which is approximately 3 out of 5 in intrinsic finger function which is approximately 1-2 out of 5.  She does have a significant amount of pain associated with this examination so full confrontational examination of the right upper extremity is difficult.  She is able to flex and extend her elbows at least antigravity but has severe pain when pressure is placed.  She is able to abduct her shoulder to at least 50 to 75 degrees.  On the contralateral side she has good strength and briskly follows commands with limited pain.  In her right lower extremity she has some pain with hip flexion but is able to at least clear slightly off the bed.  She is able to plantarflex and dorsiflex but also has weakness at that point.  Data:  Recent Labs  Lab 04/13/23 1134 04/14/23 0358  NA 140 138  K 4.2 3.6  CL 115* 112*  CO2 19* 22  BUN 32* 29*  CREATININE 1.46* 1.27*  GLUCOSE 70 149*  CALCIUM 8.6* 8.2*   Recent Labs  Lab 04/13/23 1134  AST 30  ALT 22  ALKPHOS 72     Recent Labs  Lab 04/13/23 1134 04/14/23 0358  WBC 5.7 6.8  HGB 9.8* 9.2*  HCT 29.2* 26.4*  PLT 181 192   No results for input(s): "APTT", "INR" in the last 168 hours.       Other tests/results: No new testing, known cervical stenosis on previous imaging.  Assessment/Plan:  [name] is here for a cervical spinal cord injury, mixed subtype of  central cord versus hemicord.  She has longstanding pre-existing cervical stenosis which puts her at risk for cervical central cord injury.  Given her mechanism of a fall striking the back of her head and having forced flexion this was likely her mechanism.  She has severe pain in her right hand and right leg.  She was admitted to the intensive care unit for blood pressure management and elevated MAP goals.  We did discuss surgical intervention with the family, and that it could be done either emergently or in a delayed fashion.  Their preference was to wait until her acute injury has fully declared itself.  In that case we planned for full medical management.  -MAP goal continue greater than 85 -Every 2 hour neurochecks -Cervical collar when up and out of bed -No need for strict bedrest -Physical and Occupational Therapy evaluations and treatments. -Can place a Foley catheter for urinary retention should she have continued issues   Cheryl Kim, MD/MSCR Department of Neurosurgery

## 2023-04-14 NOTE — Consult Note (Signed)
PHARMACY CONSULT NOTE - ELECTROLYTES  Pharmacy Consult for Electrolyte Monitoring and Replacement   Recent Labs: Height: 5\' 6"  (167.6 cm) Weight: 63.9 kg (140 lb 14 oz) IBW/kg (Calculated) : 59.3 Estimated Creatinine Clearance: 41.3 mL/min (A) (by C-G formula based on SCr of 1.27 mg/dL (H)). Potassium (mmol/L)  Date Value  04/14/2023 3.6  02/21/2013 3.9   Magnesium (mg/dL)  Date Value  16/04/9603 2.1  02/19/2013 1.6 (L)   Calcium (mg/dL)  Date Value  54/03/8118 8.2 (L)   Calcium, Total (mg/dL)  Date Value  14/78/2956 8.1 (L)   Albumin (g/dL)  Date Value  21/30/8657 3.4 (L)  02/19/2013 4.2   Phosphorus (mg/dL)  Date Value  84/69/6295 3.4   Sodium (mmol/L)  Date Value  04/14/2023 138  02/21/2013 142    Assessment  [name] is a 67 y.o. female presenting with cervical spinal cord injury after a fall.  PMH significant for arthritis, DM, diverticulitis, CKD. Pharmacy has been consulted to monitor and replace electrolytes.  Diet: none MIVF: D10 @ 53mL/hr   Goal of Therapy: Electrolytes WNL  Plan:  No replacement needed at this time Check BMP, Mg, Phos with AM labs  Thank you for allowing pharmacy to be a part of this patient's care.  Elliot Gurney, PharmD, BCPS Clinical Pharmacist  04/14/2023 7:44 AM

## 2023-04-15 DIAGNOSIS — G9520 Unspecified cord compression: Secondary | ICD-10-CM | POA: Diagnosis not present

## 2023-04-15 LAB — BASIC METABOLIC PANEL
Anion gap: 9 (ref 5–15)
BUN: 30 mg/dL — ABNORMAL HIGH (ref 8–23)
CO2: 20 mmol/L — ABNORMAL LOW (ref 22–32)
Calcium: 8.7 mg/dL — ABNORMAL LOW (ref 8.9–10.3)
Chloride: 108 mmol/L (ref 98–111)
Creatinine, Ser: 1.24 mg/dL — ABNORMAL HIGH (ref 0.44–1.00)
GFR, Estimated: 48 mL/min — ABNORMAL LOW (ref >60)
Glucose, Bld: 130 mg/dL — ABNORMAL HIGH (ref 70–99)
Potassium: 4.3 mmol/L (ref 3.5–5.1)
Sodium: 137 mmol/L (ref 135–145)

## 2023-04-15 LAB — GLUCOSE, CAPILLARY
Glucose-Capillary: 100 mg/dL — ABNORMAL HIGH (ref 70–99)
Glucose-Capillary: 108 mg/dL — ABNORMAL HIGH (ref 70–99)
Glucose-Capillary: 116 mg/dL — ABNORMAL HIGH (ref 70–99)
Glucose-Capillary: 125 mg/dL — ABNORMAL HIGH (ref 70–99)

## 2023-04-15 LAB — CBC
HCT: 24.8 % — ABNORMAL LOW (ref 36.0–46.0)
Hemoglobin: 8.6 g/dL — ABNORMAL LOW (ref 12.0–15.0)
MCH: 29 pg (ref 26.0–34.0)
MCHC: 34.7 g/dL (ref 30.0–36.0)
MCV: 83.5 fL (ref 80.0–100.0)
Platelets: 174 10*3/uL (ref 150–400)
RBC: 2.97 MIL/uL — ABNORMAL LOW (ref 3.87–5.11)
RDW: 14.2 % (ref 11.5–15.5)
WBC: 6.4 10*3/uL (ref 4.0–10.5)
nRBC: 0 % (ref 0.0–0.2)

## 2023-04-15 LAB — MAGNESIUM: Magnesium: 1.9 mg/dL (ref 1.7–2.4)

## 2023-04-15 LAB — PHOSPHORUS: Phosphorus: 3.2 mg/dL (ref 2.5–4.6)

## 2023-04-15 MED ORDER — ENOXAPARIN SODIUM 40 MG/0.4ML IJ SOSY
40.0000 mg | PREFILLED_SYRINGE | INTRAMUSCULAR | Status: DC
  Administered 2023-04-15 – 2023-04-21 (×7): 40 mg via SUBCUTANEOUS
  Filled 2023-04-15 (×7): qty 0.4

## 2023-04-15 NOTE — Consult Note (Signed)
PHARMACY CONSULT NOTE - ELECTROLYTES  Pharmacy Consult for Electrolyte Monitoring and Replacement   Recent Labs: Height: 5\' 6"  (167.6 cm) Weight: 61.7 kg (136 lb 0.4 oz) IBW/kg (Calculated) : 59.3 Estimated Creatinine Clearance: 42.3 mL/min (A) (by C-G formula based on SCr of 1.24 mg/dL (H)). Potassium (mmol/L)  Date Value  04/15/2023 4.3  02/21/2013 3.9   Magnesium (mg/dL)  Date Value  54/00/8676 1.9  02/19/2013 1.6 (L)   Calcium (mg/dL)  Date Value  19/50/9326 8.7 (L)   Calcium, Total (mg/dL)  Date Value  71/24/5809 8.1 (L)   Albumin (g/dL)  Date Value  98/33/8250 3.4 (L)  02/19/2013 4.2   Phosphorus (mg/dL)  Date Value  53/97/6734 3.2   Sodium (mmol/L)  Date Value  04/15/2023 137  02/21/2013 142    Assessment  [name] is a 67 y.o. female presenting with cervical spinal cord injury after a fall.  PMH significant for arthritis, DM, diverticulitis, CKD. Pharmacy has been consulted to monitor and replace electrolytes.  Diet: none  Goal of Therapy: Electrolytes WNL  Plan:  No replacement needed at this time Check BMP, Mg, Phos with AM labs  Thank you for allowing pharmacy to be a part of this patient's care.  Elliot Gurney, PharmD, BCPS Clinical Pharmacist  04/15/2023 7:44 AM

## 2023-04-15 NOTE — Progress Notes (Signed)
NAME:  [name], MRN:  [id number], DOB:  08-Oct-1955, LOS: 2 ADMISSION DATE:  04/13/2023, CONSULTATION DATE:  04/13/2023 REFERRING MD:  Dr. Vicente Males, CHIEF COMPLAINT:  Fall   Brief Pt Description / Synopsis:  67 y.o. female with PMHx significant for cervical stenosis admitted with acute Cervical Spinal Cord Injury status post fall requiring peripheral Levophed for Blood Pressure Management and elevated MAP goals.  History of Present Illness:  67 yo female who presented to [facility] ER via EMS on 10/7 following a fall.  Per ER notes she was helping a friend and fell backwards.  She reports no memory of the fall but there is a question of if she loss consciousness.  EMS placed pt in a C-Collar.  Following the fall she developed a burning pain in her bilateral upper extremities which is worse on the right. She also had some burning pain in her feet.    ED Course  Upon arrival to the ER pt c/o having neck/head and right lower extremity pain.  She also endorsed RUE/RLE weakness when compared to the left side which is new.  Pt found to be hypoglycemic CBG 61, she received 1 amp of D50W followed by D10 infusion at 75 ml/hr.  Right hip x-ray revealed acute fractures of the right superior and inferior pubic rami.  Orthopedic surgery consulted by EDP and recommended weightbearing as tolerated.  MRI Cervical Spine concerning for cervical spinal cord injury.  Neurosurgeon Dr. Katrinka Blazing consulted by EDP.  He discussed the following options with pt and her family:  acute vs. delayed surgical management of the spinal cord injury. Following discussion pt and family decided to treat the spinal cord injury medically and have surgery when she is in a less acute stage of injury/illness.  PCCM team contacted for ICU admission for vasopressor to maintain map greater than 85 and placement of arterial line for closer bp monitoring.     Significant labs: chloride 115/CO2 19/BUN 32/creatinine 1.46/calcium 8.6/albumin 3.4/hgb 9.8    CT Head/Cervical Spine:  No acute intracranial abnormality. Soft tissue swelling along the right parietal scalp. No evidence of an underlying calvarial fracture. No acute fracture or traumatic malalignment of the cervical spine. Multilevel degenerative changes with calcified disc bulges and ligamentum flavum hypertrophy that results in moderate to severe spinal canal narrowing at C2-C3 and at least moderate spinal canal narrowing at C3-C4. If the patient has new onset myelopathic symptoms, further evaluation with a cervical spine MRI is recommended.   MR Cervical Spine: No acute traumatic finding by MRI. No fracture or evidence of ligamentous injury. Severe spinal stenosis at C2-3 and C3-4 due to endplate osteophytes, bulging of the disc and posterior ligamentous prominence. AP diameter of the canal in the midline as narrow as 4.5- 4.7 mm. Effacement of the subarachnoid space and deformity of the cord. Patient would be at risk of compressive myelopathy. Bilateral foraminal stenosis also present at these levels. Moderate spinal stenosis at C4-5 and C5-6 due to endplate osteophytes, bulging of the disc and posterior ligamentous prominence. Narrowing of the subarachnoid space with slight indentation of the cord at C4-5. Bilateral foraminal stenosis at C4-5.  Please see "Significant Hospital Events" section below for full detailed hospital course.  Pertinent  Medical History   Past Medical History:  Diagnosis Date   Arthritis    Blood dyscrasia    patient stated she is a "free bleeder"   Diabetes mellitus without complication (HCC)    Diverticulosis    Hypothyroidism  Neuropathy    Renal insufficiency     Micro Data:  10/7: MRSA PCR>>negative 10/8: HIV screen>>negative  Antimicrobials:   Anti-infectives (From admission, onward)    None       Significant Hospital Events: Including procedures, antibiotic start and stop dates in addition to other pertinent events   10/7: Pt admitted  to ICU with a cervical spinal cord injury following a fall treating medically for now per pt and pts family request with vasopressor to maintain map greater than 85 and for arterial line placement for closer bp monitoring  10/8 +spinal cord injury. Requiring low dose Levophed to maintain MAP >85. Foley placed due to urinary retention. 10/9: With continued weakness and pain of RUE/LLE. Remains on low dose Levophed (2 mcg), weaning as tolerated.  Interim History / Subjective:  -No significant events noted overnight -Afebrile, hemodynamically stable, on room air -Is on 2 mcg Levophed to maintain MAP >85 ~ is actually hypertensive on 2 mcg, weaning as tolerated -Foley has been placed for urinary retention -Continues to complain/exhibit RUE/RLE weakness and pain -Creatinine stable at 1.24 from 1.27  ~UOP 1.3 L last 24 hrs (net even)  Objective   Blood pressure (!) 152/58, pulse 98, temperature 98.5 F (36.9 C), resp. rate 20, height 5\' 6"  (1.676 m), weight 61.7 kg, SpO2 95%.        Intake/Output Summary (Last 24 hours) at 04/15/2023 0729 Last data filed at 04/15/2023 0600 Gross per 24 hour  Intake 564.05 ml  Output 1325 ml  Net -760.95 ml   Filed Weights   04/13/23 1905 04/14/23 0415 04/15/23 0414  Weight: 62.8 kg 63.9 kg 61.7 kg    Examination: General: Acute on chronically ill-appearing female, laying in bed, on room air, no acute distress HENT: Normocephalic, neck supple, no JVD Lungs: Clear breath sounds throughout, even, nonlabored, normal effort Cardiovascular: Tachycardia, regular rhythm, S1-S2, no murmurs, rubs, gallops Abdomen: Soft, nontender, nondistended, no guarding or rebound tenderness, bowel sounds positive x 4 Extremities: Right upper and lower extremities weak (2-3 out of 5) with pain as compared to the left, positive sensation in all 4 extremities Neuro: Sleeping, arouses easily to voice, alert and oriented x 4, right upper and lower extremities weaker as compared to  the left (2-3 out of 5) with pain, positive sensation in all 4 extremities GU: Foley catheter in place draining yellow urine  Resolved Hospital Problem list     Assessment & Plan:   #Acute Cervical Spinal Cord Injury s/p fall per neurosurgery due to previous significant cervical stenosis noted on MRI this likely puts her at high risk for a central cord injury with a flexion mechanism given the back of the head strike  PMHx: Cervical stenosis, Neuropathy -Continuous cardiac monitoring -Maintain MAP >85 -Vasopressors as needed to maintain MAP goal -Neurosurgery following, appreciate input ~ Follow recommendation as below: -Neuro checks q2h -Cervical collar when up and ambulating, does not require strict bedrest -PT/OT -Foley catheter for urinary retention -Continue outpatient Gabapentin -Pain management  #CKD Stage III #Metabolic Acidosis -Monitor I&O's / urinary output -Follow BMP -Ensure adequate renal perfusion -Avoid nephrotoxic agents as able -Replace electrolytes as indicated ~ Pharmacy following for assistance with electrolyte replacement  #Normocytic Normochromic Anemia without s/sx of acute blood loss -Monitor for S/Sx of bleeding -Trend CBC -Heparin SQ for VTE Prophylaxis  -Transfuse for Hgb <7  #Acute Fracture of the right superior and inferior pubic rami -Orthopedics consulted, appreciate input ~ recommended weightbearing as tolerated -PT/OT -Pain control  #  Hypoglycemia #Hypothyroidism PMHx: Type II DM -CBG's q4h; Target range of 140 to 180 -SSI -Follow ICU Hypo/Hyperglycemia protocol -Hold outpatient glipizide, farxiga, metformin, and lantus for now -Continue outpatient Synthroid    Best Practice (right click and "Reselect all SmartList Selections" daily)   Diet/type: Regular consistency (see orders) DVT prophylaxis: prophylactic heparin  GI prophylaxis: N/A Lines: Arterial line, and is still needed Foley:  Yes, and it is still needed Code Status:   full code Last date of multidisciplinary goals of care discussion [10/9]  10/9: Pt and her husband updated at bedside on plan of care  Labs   CBC: Recent Labs  Lab 04/13/23 1134 04/14/23 0358 04/15/23 0457  WBC 5.7 6.8 6.4  NEUTROABS 3.3  --   --   HGB 9.8* 9.2* 8.6*  HCT 29.2* 26.4* 24.8*  MCV 87.2 84.6 83.5  PLT 181 192 174    Basic Metabolic Panel: Recent Labs  Lab 04/13/23 1134 04/14/23 0358 04/15/23 0457  NA 140 138 137  K 4.2 3.6 4.3  CL 115* 112* 108  CO2 19* 22 20*  GLUCOSE 70 149* 130*  BUN 32* 29* 30*  CREATININE 1.46* 1.27* 1.24*  CALCIUM 8.6* 8.2* 8.7*  MG  --  2.1 1.9  PHOS  --  3.4 3.2   GFR: Estimated Creatinine Clearance: 42.3 mL/min (A) (by C-G formula based on SCr of 1.24 mg/dL (H)). Recent Labs  Lab 04/13/23 1134 04/14/23 0358 04/15/23 0457  WBC 5.7 6.8 6.4    Liver Function Tests: Recent Labs  Lab 04/13/23 1134  AST 30  ALT 22  ALKPHOS 72  BILITOT 0.6  PROT 7.3  ALBUMIN 3.4*   No results for input(s): "LIPASE", "AMYLASE" in the last 168 hours. No results for input(s): "AMMONIA" in the last 168 hours.  ABG No results found for: "PHART", "PCO2ART", "PO2ART", "HCO3", "TCO2", "ACIDBASEDEF", "O2SAT"   Coagulation Profile: No results for input(s): "INR", "PROTIME" in the last 168 hours.  Cardiac Enzymes: No results for input(s): "CKTOTAL", "CKMB", "CKMBINDEX", "TROPONINI" in the last 168 hours.  HbA1C: Hemoglobin A1C  Date/Time Value Ref Range Status  02/20/2013 04:02 AM 10.3 (H) 4.2 - 6.3 % Final    Comment:    The American Diabetes Association recommends that a primary goal of therapy should be <7% and that physicians should reevaluate the treatment regimen in patients with HbA1c values consistently >8%.    Hgb A1c MFr Bld  Date/Time Value Ref Range Status  04/13/2023 11:34 AM 5.9 (H) 4.8 - 5.6 % Final    Comment:    (NOTE) Pre diabetes:          5.7%-6.4%  Diabetes:              >6.4%  Glycemic control for    <7.0% adults with diabetes   08/11/2021 08:39 PM 7.2 (H) 4.8 - 5.6 % Final    Comment:    (NOTE) Pre diabetes:          5.7%-6.4%  Diabetes:              >6.4%  Glycemic control for   <7.0% adults with diabetes     CBG: Recent Labs  Lab 04/14/23 1422 04/14/23 1526 04/14/23 1959 04/14/23 2327 04/15/23 0709  GLUCAP 131* 115* 154* 112* 108*    Review of Systems:   Positives in BOLD: Gen: Denies fever, chills, weight change, fatigue, night sweats HEENT: Denies blurred vision, double vision, hearing loss, tinnitus, sinus congestion, rhinorrhea, sore throat, neck stiffness,  dysphagia PULM: Denies shortness of breath, cough, sputum production, hemoptysis, wheezing CV: Denies chest pain, edema, orthopnea, paroxysmal nocturnal dyspnea, palpitations GI: Denies abdominal pain, nausea, vomiting, diarrhea, hematochezia, melena, constipation, change in bowel habits GU: Denies dysuria, hematuria, polyuria, oliguria, urethral discharge Endocrine: Denies hot or cold intolerance, polyuria, polyphagia or appetite change Derm: Denies rash, dry skin, scaling or peeling skin change Heme: Denies easy bruising, bleeding, bleeding gums Neuro: Denies headache, numbness, right sided weakness (RUE & RLE), slurred speech, loss of memory or consciousness   Past Medical History:  She,  has a past medical history of Arthritis, Blood dyscrasia, Diabetes mellitus without complication (HCC), Diverticulosis, Hypothyroidism, Neuropathy, and Renal insufficiency.   Surgical History:   Past Surgical History:  Procedure Laterality Date   ABDOMINAL HYSTERECTOMY     BILATERAL SALPINGECTOMY Bilateral 03/16/2017   Procedure: BILATERAL SALPINGECTOMY;  Surgeon: Schermerhorn, Ihor Austin, MD;  Location: [facility] ORS;  Service: Gynecology;  Laterality: Bilateral;   COLONOSCOPY WITH PROPOFOL N/A 08/18/2018   Procedure: COLONOSCOPY WITH PROPOFOL;  Surgeon: Scot Jun, MD;  Location: [facility] ENDOSCOPY;  Service:  Endoscopy;  Laterality: N/A;   CYSTOCELE REPAIR N/A 03/16/2017   Procedure: ANTERIOR REPAIR (CYSTOCELE);  Surgeon: Schermerhorn, Ihor Austin, MD;  Location: [facility] ORS;  Service: Gynecology;  Laterality: N/A;   ESOPHAGOGASTRODUODENOSCOPY (EGD) WITH PROPOFOL N/A 08/18/2018   Procedure: ESOPHAGOGASTRODUODENOSCOPY (EGD) WITH PROPOFOL;  Surgeon: Scot Jun, MD;  Location: [facility] ENDOSCOPY;  Service: Endoscopy;  Laterality: N/A;   TEAR DUCT PROBING Right    TUBAL LIGATION     VAGINAL HYSTERECTOMY N/A 03/16/2017   Procedure: HYSTERECTOMY VAGINAL;  Surgeon: Schermerhorn, Ihor Austin, MD;  Location: [facility] ORS;  Service: Gynecology;  Laterality: N/A;   WISDOM TOOTH EXTRACTION       Social History:   reports that she has never smoked. She has never used smokeless tobacco. She reports that she does not drink alcohol and does not use drugs.   Family History:  Her family history includes Breast cancer in her maternal aunt; Diabetes in her father; Hypertension in her father and mother.   Allergies Allergies  Allergen Reactions   Augmentin [Amoxicillin-Pot Clavulanate] Itching   Ampicillin Rash     Home Medications  Prior to Admission medications   Medication Sig Start Date End Date Taking? Authorizing Provider  calcium carbonate (OS-CAL - DOSED IN MG OF ELEMENTAL CALCIUM) 1250 (500 Ca) MG tablet Take 1 tablet by mouth daily with breakfast.   Yes [provider]  cholecalciferol (VITAMIN D3) 25 MCG (1000 UNIT) tablet Take 1,000 Units by mouth daily.   Yes [provider]  ertugliflozin L-PyroglutamicAc (STEGLATRO) 15 MG TABS tablet Take 15 mg by mouth daily.   Yes [provider]  gabapentin (NEURONTIN) 100 MG capsule Take 200 mg by mouth 2 (two) times daily.  08/16/15  Yes [provider]  glipiZIDE (GLUCOTROL) 5 MG tablet Take 5 mg by mouth daily before breakfast.   Yes [provider]  lactase (LACTAID) 3000 units tablet Take 3,000 Units by mouth daily before  breakfast.   Yes [provider]  levothyroxine (SYNTHROID) 100 MCG tablet Take 100 mcg by mouth daily before breakfast.   Yes [provider]  omeprazole (PRILOSEC) 40 MG capsule Take 1 capsule (40 mg total) by mouth 2 (two) times daily. Take 30 min after Synthroid and 30 min before breakfast 08/21/22 04/14/23 Yes Pilar Jarvis, MD  oxybutynin (DITROPAN-XL) 5 MG 24 hr tablet Take 5 mg by mouth at bedtime.  Yes [provider]  psyllium (REGULOID) 0.52 g capsule Take 0.52 g by mouth daily.   Yes [provider]  rosuvastatin (CRESTOR) 5 MG tablet Take 5 mg by mouth daily.   Yes [provider]  vitamin B-12 (CYANOCOBALAMIN) 100 MCG tablet Take 100 mcg by mouth daily.   Yes [provider]  insulin glargine (LANTUS) 100 UNIT/ML Solostar Pen Inject 14 Units into the skin at bedtime. Patient not taking: Reported on 04/14/2023    [provider]  metoCLOPramide (REGLAN) 10 MG tablet Take 1 tablet (10 mg total) by mouth every 8 (eight) hours as needed for nausea or vomiting. Patient not taking: Reported on 04/14/2023 08/21/22 08/21/23  Dionne Bucy, MD  ondansetron (ZOFRAN-ODT) 4 MG disintegrating tablet Take 1 tablet (4 mg total) by mouth every 6 (six) hours as needed for nausea or vomiting. 03/21/22   Ward, Layla Maw, DO  oxyCODONE-acetaminophen (PERCOCET/ROXICET) 5-325 MG tablet Take 2 tablets by mouth every 6 (six) hours as needed. Patient not taking: Reported on 04/14/2023 03/21/22   Ward, Layla Maw, DO     Critical care time: 40 minutes     Harlon Ditty, AGACNP-BC [facility] Prefer epic messenger for cross cover needs If after hours, please call E-link

## 2023-04-15 NOTE — Evaluation (Signed)
Physical Therapy Evaluation Patient Details Name: [name] MRN: [id number] DOB: 21-Jun-1956 Today's Date: 04/15/2023  History of Present Illness  Pt is a 67 yo female who presented to [facility] ER via EMS on 04/13/23 following a fall. Per ER notes she was helping a friend and fell backwards. She reports no memory of the fall but there is a question of if she loss consciousness. EMS placed pt in a C-Collar.  Following the fall she developed a burning pain in her bilateral upper extremities which is worse on the right. She also had some burning pain in her feet.  Clinical Impression  Pt is seen by OT and PT for co-evaluation due to Pt's medical complexity and to address functional mobility. Pt is received in bed with RN at bedside, she is agreeable to therapy session. At baseline, Pt reports amb without AD and being independent with ADLs/IADLs. VSS monitored with RN at bedside throughout session. Pt performs bed mobility min-mod A x2 and transfers min A x2 for safety. At initial seated EOB, Pt reports dizziness which improved with time. During standing activity, Pt reports 10/10 NPS on R hip pain near pubic fracture-pain improved in seated. Unable to assess further mobility at this time secondary to pain limitation. Pt requires frequent cuing throughout session to maintain eyes open. Pt would benefit from skilled PT to address above deficits and promote optimal return to PLOF.      If plan is discharge home, recommend the following: A lot of help with walking and/or transfers;A lot of help with bathing/dressing/bathroom;Assist for transportation;Help with stairs or ramp for entrance   Can travel by private vehicle        Equipment Recommendations Rolling walker (2 wheels);Other (comment) (TBD at this time)  Recommendations for Other Services       Functional Status Assessment Patient has had a recent decline in their functional status and demonstrates the ability to make significant improvements  in function in a reasonable and predictable amount of time.     Precautions / Restrictions Precautions Precautions: Cervical Precaution Booklet Issued: No Precaution Comments: c/s [place] collar at bedside Required Braces or Orthoses: Cervical Brace Cervical Brace: Hard collar;Other (comment) (for mobility) Restrictions Weight Bearing Restrictions: No Other Position/Activity Restrictions: don c-collar at EOB per MD; able to eat and go to bathroom without c-collar      Mobility  Bed Mobility Overal bed mobility: Needs Assistance Bed Mobility: Sit to Supine, Supine to Sit     Supine to sit: Mod assist, +2 for physical assistance Sit to supine: Min assist, +2 for physical assistance   General bed mobility comments: supine>sitting EOB mod A x2 due to generalized weakness and pain; sitting EOB>supin min A x2 for guidance of trunk and BLE management    Transfers Overall transfer level: Needs assistance Equipment used: Rolling walker (2 wheels) Transfers: Sit to/from Stand Sit to Stand: Min assist, +2 physical assistance           General transfer comment: able to perform STS min A x2 to facilitate task; multimodal cuing to maintain eyes open and promote upright standing posture    Ambulation/Gait               General Gait Details: Deferred at this time due to pain and safety concerns  Stairs            Wheelchair Mobility     Tilt Bed    Modified Rankin (Stroke Patients Only)       Balance  Overall balance assessment: Needs assistance Sitting-balance support: Bilateral upper extremity supported, Feet supported Sitting balance-Leahy Scale: Fair Sitting balance - Comments: able to maintain seated EOB balance with BUE support   Standing balance support: Bilateral upper extremity supported, During functional activity, Reliant on assistive device for balance Standing balance-Leahy Scale: Fair Standing balance comment: heavy reliance on AD; cues to  promote upright posture                             Pertinent Vitals/Pain Pain Assessment Pain Assessment: 0-10 Pain Score: 10-Worst pain ever Pain Location: RLE pain Pain Descriptors / Indicators: Discomfort, Aching, Dull, Moaning, Guarding, Grimacing Pain Intervention(s): Limited activity within patient's tolerance, Monitored during session, Repositioned    Home Living Family/patient expects to be discharged to:: Private residence Living Arrangements: Spouse/significant other;Children Available Help at Discharge: Family Type of Home: Apartment Home Access: Level entry       Home Layout: One level Home Equipment: None      Prior Function Prior Level of Function : Working/employed;Driving             Mobility Comments: Pt reports Independent with mobility and 1 fall last year ADLs Comments: Getting dog today, husaband works fulltime, son works part time, Pt reports independent with all ADL/IADL prior to hospital admission.     Extremity/Trunk Assessment   Upper Extremity Assessment Upper Extremity Assessment: Defer to OT evaluation;Generalized weakness    Lower Extremity Assessment Lower Extremity Assessment: Generalized weakness       Communication   Communication Communication: No apparent difficulties Cueing Techniques: Verbal cues  Cognition Arousal: Lethargic, Alert Behavior During Therapy: WFL for tasks assessed/performed Overall Cognitive Status: Within Functional Limits for tasks assessed                                 General Comments: AO x4; requires slight increase time to answer to questions; keeps eyes closed throughout session with cues to reminder her to maintain open        General Comments General comments (skin integrity, edema, etc.): VSS monitored throughout with RN at bedside for safety; fem art line assessed pre/post treatment and intact; BLE sensation symmetrical throughout with slight reports of N/T on  RLE.    Exercises Other Exercises Other Exercises: x3 lat steps toward HOB using RW, min A x2 with reports of R hip pain near fx location   Assessment/Plan    PT Assessment Patient needs continued PT services  PT Problem List Decreased strength;Decreased mobility;Decreased range of motion;Decreased activity tolerance;Decreased balance;Pain       PT Treatment Interventions DME instruction;Therapeutic exercise;Gait training;Balance training;Stair training;Functional mobility training;Therapeutic activities    PT Goals (Current goals can be found in the Care Plan section)  Acute Rehab PT Goals Patient Stated Goal: to go home and get puppy PT Goal Formulation: With patient Time For Goal Achievement: 04/29/23 Potential to Achieve Goals: Good    Frequency Min 1X/week     Co-evaluation               AM-PAC PT "6 Clicks" Mobility  Outcome Measure Help needed turning from your back to your side while in a flat bed without using bedrails?: A Little Help needed moving from lying on your back to sitting on the side of a flat bed without using bedrails?: A Little Help needed moving to and from a bed to  a chair (including a wheelchair)?: A Little Help needed standing up from a chair using your arms (e.g., wheelchair or bedside chair)?: A Little Help needed to walk in hospital room?: A Lot Help needed climbing 3-5 steps with a railing? : Total 6 Click Score: 15    End of Session   Activity Tolerance: Patient limited by pain Patient left: in bed;with call bell/phone within reach;with bed alarm set;with family/visitor present Nurse Communication: Mobility status PT Visit Diagnosis: Unsteadiness on feet (R26.81);Muscle weakness (generalized) (M62.81);Pain;Dizziness and giddiness (R42) Pain - Right/Left: Right Pain - part of body: Hip;Leg    Time: 1308-6578 PT Time Calculation (min) (ACUTE ONLY): 30 min   Charges:                 Elmon Else, SPT   Eldrick Penick 04/15/2023, 12:16 PM

## 2023-04-15 NOTE — Progress Notes (Signed)
? ?  Inpatient Rehab Admissions Coordinator : ? ?Per therapy recommendations, patient was screened for CIR candidacy by Blue Ruggerio RN MSN.  At this time patient appears to be a potential candidate for CIR. I will place a rehab consult per protocol for full assessment. Please call me with any questions. ? ?Jerriyah Louis RN MSN ?Admissions Coordinator ?[phone] ?  ?

## 2023-04-15 NOTE — Evaluation (Addendum)
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   Clinical Impression   Chart reviewed, nurse cleared pt for participation in OT evaluation. Co tx completed with PT on this date. Pt is alert and oriented x4, lethargic requiring multi modal cues for sustained attention to task. PTA pt was indep in ADL/IADL, working full time. Pt presents with deficits in strength, endurance, activity tolerance, balance, RUE function affecting safe and optimal ADL completion. Pt would benefit from intensive therapy to address functional deficits and to facilitate optimal return to PLOF. OT will follow acutely.       If plan is discharge home, recommend the following: A lot of help with walking and/or transfers;A lot of help with bathing/dressing/bathroom;Assistance with feeding;Direct supervision/assist for financial management;Help with stairs or ramp for entrance;Direct supervision/assist for medications management    Functional Status Assessment  Patient has had a recent decline in their functional status and demonstrates the ability to make significant improvements in function in a reasonable and predictable amount of time.  Equipment Recommendations  Other (comment) (per next venue of care)    Recommendations for Other Services       Precautions / Restrictions Precautions Precautions: Cervical Precaution Booklet Issued: No Precaution Comments: c/s [place] collar at bedside Required Braces  or Orthoses: Cervical Brace Cervical Brace: Hard collar Restrictions Weight Bearing Restrictions: No Other Position/Activity Restrictions: per orders, ok to donn collar edge of bed, amb to bathroom/shower without c collar      Mobility Bed Mobility Overal bed mobility: Needs Assistance Bed Mobility: Sit to Supine, Supine to Sit     Supine to sit: Mod assist, +2 for physical assistance Sit to supine: Min assist, +2 for physical assistance   General bed mobility comments: three lateral steps up the bed to the left with MIN A +2 with RW    Transfers Overall transfer level: Needs assistance Equipment used: Rolling walker (2 wheels) Transfers: Sit to/from Stand Sit to Stand: Min assist, +2 physical assistance                  Balance Overall balance assessment: Needs assistance Sitting-balance support: Bilateral upper extremity supported, Feet supported Sitting balance-Leahy Scale: Fair     Standing balance support: Bilateral upper extremity supported, During functional activity, Reliant on assistive device for balance Standing balance-Leahy Scale: Fair                             ADL either performed or assessed with clinical judgement   ADL Overall ADL's : Needs assistance/impaired Eating/Feeding: Maximal assistance;Sitting   Grooming: Brushing hair;Sitting;Maximal assistance Grooming Details (indicate cue type and reason): at edge of bed             Lower Body Dressing: Maximal assistance         Toileting - Clothing Manipulation Details (indicate cue type and reason): foley        [place] collar donned with TOTAL A at edge of bed     Vision Patient Visual Report: No change  from baseline       Perception         Praxis         Pertinent Vitals/Pain Pain Assessment Pain Assessment: 0-10 Pain Score: 10-Worst pain ever Pain Location: RLE pain Pain Descriptors / Indicators: Discomfort, Aching, Dull, Moaning, Guarding,  Grimacing Pain Intervention(s): Limited activity within patient's tolerance, Monitored during session, Repositioned     Extremity/Trunk Assessment Upper Extremity Assessment Upper Extremity Assessment: Right hand dominant;RUE deficits/detail;LUE deficits/detail RUE Deficits / Details: AROM: shoulder flexion approx 1/2 full AROM, elbow approx 3/4 full AROM, wrist appears WFL will continue to assess; PROM: shoulder flexion approx 3/4 full PROM, then becomes pain limited; Pt with reported pain during strength testing, will continue to assess; Impaired grip strength RUE: Unable to fully assess due to pain RUE Sensation: WNL RUE Coordination: decreased fine motor;decreased gross motor LUE Deficits / Details: AROM: shoudler flexion approx 3/4 full AROM, elbow/wrist appear WFL; Pt with reported pain during strength testing, will continue to assess LUE: Unable to fully assess due to pain LUE Sensation: WNL LUE Coordination: decreased fine motor   Lower Extremity Assessment Lower Extremity Assessment: Defer to PT evaluation;Generalized weakness   Cervical / Trunk Assessment Cervical / Trunk Assessment:  (noted cervical spine extension while at rest in bed, repositioned)   Communication Communication Communication: Difficulty following commands/understanding Following commands: Follows one step commands with increased time Cueing Techniques: Verbal cues;Tactile cues;Visual cues   Cognition Arousal: Alert, Lethargic Behavior During Therapy: WFL for tasks assessed/performed Overall Cognitive Status: Impaired/Different from baseline Area of Impairment: Following commands, Attention                   Current Attention Level: Focused   Following Commands: Follows one step commands with increased time     Problem Solving: Requires verbal cues, Requires tactile cues General Comments: mutli modal cues to open eyes     General Comments  RN in room throughout session, a line intact  pre/post session; vss throughout. No change in sensation/strength pre/post session;    Exercises Other Exercises Other Exercises: edu re: role of OT, role of rehab, discharge recommendations, breathing techniques for improved diaphragmatic breathing   Shoulder Instructions      Home Living Family/patient expects to be discharged to:: Private residence Living Arrangements: Spouse/significant other;Children Available Help at Discharge: Family Type of Home: Apartment Home Access: Level entry     Home Layout: One level     Bathroom Shower/Tub: Dietitian: None          Prior Functioning/Environment Prior Level of Function : Working/employed;Driving             Mobility Comments: Pt reports Independent with mobility and 1 fall last year ADLs Comments: Getting dog today, husaband works fulltime, son works part time, Pt reports independent with all ADL/IADL prior to hospital admission.        OT Problem List: Decreased strength;Decreased activity tolerance;Impaired balance (sitting and/or standing);Decreased knowledge of use of DME or AE;Decreased knowledge of precautions;Impaired UE functional use      OT Treatment/Interventions: Self-care/ADL training;Balance training;Therapeutic exercise;Therapeutic activities;Energy conservation;DME and/or AE instruction;Patient/family education    OT Goals(Current goals can be found in the care plan section) Acute Rehab OT Goals Patient Stated Goal: rehab OT Goal Formulation: With patient Time For Goal Achievement: 04/29/23 Potential to Achieve Goals: Good ADL Goals Pt Will Perform Grooming: with modified independence;standing;sitting Pt Will Perform Lower Body Dressing: with  modified independence;sit to/from stand Pt Will Transfer to Toilet: with modified independence;ambulating Pt Will Perform Toileting - Clothing Manipulation and hygiene: with modified independence;sit to/from stand  OT Frequency:  Min 1X/week    Co-evaluation PT/OT/SLP Co-Evaluation/Treatment: Yes Reason for Co-Treatment: Complexity of the patient's impairments (multi-system involvement);Necessary to address cognition/behavior during functional activity;For patient/therapist safety   OT goals addressed during session: ADL's and self-care;Proper use of Adaptive equipment and DME      AM-PAC OT "6 Clicks" Daily Activity     Outcome Measure Help from another person eating meals?: A Lot Help from another person taking care of personal grooming?: A Lot Help from another person toileting, which includes using toliet, bedpan, or urinal?: A Lot Help from another person bathing (including washing, rinsing, drying)?: A Lot Help from another person to put on and taking off regular upper body clothing?: A Lot Help from another person to put on and taking off regular lower body clothing?: A Lot 6 Click Score: 12   End of Session Equipment Utilized During Treatment: Rolling walker (2 wheels) Nurse Communication: Mobility status  Activity Tolerance: Patient tolerated treatment well Patient left: in bed;with call bell/phone within reach;with bed alarm set;with nursing/sitter in room;with family/visitor present  OT Visit Diagnosis: Muscle weakness (generalized) (M62.81);Other abnormalities of gait and mobility (R26.89)                Time: 3212-2482 OT Time Calculation (min): 30 min Charges:  OT General Charges $OT Visit: 1 Visit OT Evaluation $OT Eval High Complexity: 1 High  Oleta Mouse, OTD OTR/L  04/15/23, 1:14 PM

## 2023-04-16 ENCOUNTER — Inpatient Hospital Stay: Payer: Medicare HMO

## 2023-04-16 DIAGNOSIS — G9520 Unspecified cord compression: Secondary | ICD-10-CM | POA: Diagnosis not present

## 2023-04-16 LAB — GLUCOSE, CAPILLARY
Glucose-Capillary: 121 mg/dL — ABNORMAL HIGH (ref 70–99)
Glucose-Capillary: 165 mg/dL — ABNORMAL HIGH (ref 70–99)
Glucose-Capillary: 114 mg/dL — ABNORMAL HIGH (ref 70–99)
Glucose-Capillary: 111 mg/dL — ABNORMAL HIGH (ref 70–99)
Glucose-Capillary: 105 mg/dL — ABNORMAL HIGH (ref 70–99)
Glucose-Capillary: 130 mg/dL — ABNORMAL HIGH (ref 70–99)
Glucose-Capillary: 122 mg/dL — ABNORMAL HIGH (ref 70–99)
Glucose-Capillary: 98 mg/dL (ref 70–99)

## 2023-04-16 LAB — BLOOD GAS, ARTERIAL
Acid-base deficit: 2.9 mmol/L — ABNORMAL HIGH (ref 0.0–2.0)
Bicarbonate: 21.9 mmol/L (ref 20.0–28.0)
FIO2: 21 %
O2 Saturation: 98.9 %
Patient temperature: 37
pCO2 arterial: 37 mm[Hg] (ref 32–48)
pH, Arterial: 7.38 (ref 7.35–7.45)
pO2, Arterial: 108 mm[Hg] (ref 83–108)

## 2023-04-16 LAB — RENAL FUNCTION PANEL
Albumin: 3 g/dL — ABNORMAL LOW (ref 3.5–5.0)
Anion gap: 11 (ref 5–15)
BUN: 32 mg/dL — ABNORMAL HIGH (ref 8–23)
CO2: 21 mmol/L — ABNORMAL LOW (ref 22–32)
Calcium: 8.7 mg/dL — ABNORMAL LOW (ref 8.9–10.3)
Chloride: 107 mmol/L (ref 98–111)
Creatinine, Ser: 1.41 mg/dL — ABNORMAL HIGH (ref 0.44–1.00)
GFR, Estimated: 41 mL/min — ABNORMAL LOW (ref >60)
Glucose, Bld: 119 mg/dL — ABNORMAL HIGH (ref 70–99)
Phosphorus: 4 mg/dL (ref 2.5–4.6)
Potassium: 4 mmol/L (ref 3.5–5.1)
Sodium: 139 mmol/L (ref 135–145)

## 2023-04-16 LAB — CBC
HCT: 26.7 % — ABNORMAL LOW (ref 36.0–46.0)
Hemoglobin: 8.8 g/dL — ABNORMAL LOW (ref 12.0–15.0)
MCH: 28.9 pg (ref 26.0–34.0)
MCHC: 33 g/dL (ref 30.0–36.0)
MCV: 87.5 fL (ref 80.0–100.0)
Platelets: 181 10*3/uL (ref 150–400)
RBC: 3.05 MIL/uL — ABNORMAL LOW (ref 3.87–5.11)
RDW: 14.2 % (ref 11.5–15.5)
WBC: 7.8 10*3/uL (ref 4.0–10.5)
nRBC: 0.3 % — ABNORMAL HIGH (ref 0.0–0.2)

## 2023-04-16 LAB — MAGNESIUM: Magnesium: 2.1 mg/dL (ref 1.7–2.4)

## 2023-04-16 MED ORDER — CHLORHEXIDINE GLUCONATE CLOTH 2 % EX PADS: 6.0000 | MEDICATED_PAD | Freq: Every day | CUTANEOUS | Status: DC

## 2023-04-16 MED ORDER — PANTOPRAZOLE SODIUM 40 MG PO TBEC
40.0000 mg | DELAYED_RELEASE_TABLET | Freq: Every day | ORAL | Status: DC
  Administered 2023-04-17 – 2023-04-22 (×6): 40 mg via ORAL
  Filled 2023-04-16 (×6): qty 1

## 2023-04-16 NOTE — Consult Note (Signed)
PHARMACY CONSULT NOTE - ELECTROLYTES  Pharmacy Consult for Electrolyte Monitoring and Replacement   Recent Labs: Height: 5\' 6"  (167.6 cm) Weight: 64.7 kg (142 lb 10.2 oz) IBW/kg (Calculated) : 59.3 Estimated Creatinine Clearance: 37.2 mL/min (A) (by C-G formula based on SCr of 1.41 mg/dL (H)). Potassium (mmol/L)  Date Value  04/16/2023 4.0  02/21/2013 3.9   Magnesium (mg/dL)  Date Value  16/04/9603 2.1  02/19/2013 1.6 (L)   Calcium (mg/dL)  Date Value  54/03/8118 8.7 (L)   Calcium, Total (mg/dL)  Date Value  14/78/2956 8.1 (L)   Albumin (g/dL)  Date Value  21/30/8657 3.0 (L)  02/19/2013 4.2   Phosphorus (mg/dL)  Date Value  84/69/6295 4.0   Sodium (mmol/L)  Date Value  04/16/2023 139  02/21/2013 142    Assessment  [name] is a 67 y.o. female presenting with cervical spinal cord injury after a fall.  PMH significant for arthritis, DM, diverticulitis, CKD. Pharmacy has been consulted to monitor and replace electrolytes.  Diet: none  Goal of Therapy: Electrolytes WNL  Plan:  No replacement needed at this time Check BMP with AM labs  Thank you for allowing pharmacy to be a part of this patient's care.  Elliot Gurney, PharmD, BCPS Clinical Pharmacist  04/16/2023 7:38 AM

## 2023-04-16 NOTE — Progress Notes (Signed)
NAME:  [name], MRN:  [id number], DOB:  [birth year], LOS: 3 ADMISSION DATE:  04/13/2023, CONSULTATION DATE:  04/13/2023 REFERRING MD:  Dr. Vicente Males, CHIEF COMPLAINT:  Fall   Brief Pt Description / Synopsis:  67 y.o. female with PMHx significant for cervical stenosis admitted with acute Cervical Spinal Cord Injury status post fall requiring peripheral Levophed for Blood Pressure Management and elevated MAP goals.  History of Present Illness:  67 yo female who presented to [facility] ER via EMS on 10/7 following a fall.  Per ER notes she was helping a friend and fell backwards.  She reports no memory of the fall but there is a question of if she loss consciousness.  EMS placed pt in a C-Collar.  Following the fall she developed a burning pain in her bilateral upper extremities which is worse on the right. She also had some burning pain in her feet.    ED Course  Upon arrival to the ER pt c/o having neck/head and right lower extremity pain.  She also endorsed RUE/RLE weakness when compared to the left side which is new.  Pt found to be hypoglycemic CBG 61, she received 1 amp of D50W followed by D10 infusion at 75 ml/hr.  Right hip x-ray revealed acute fractures of the right superior and inferior pubic rami.  Orthopedic surgery consulted by EDP and recommended weightbearing as tolerated.  MRI Cervical Spine concerning for cervical spinal cord injury.  Neurosurgeon Dr. Katrinka Blazing consulted by EDP.  He discussed the following options with pt and her family:  acute vs. delayed surgical management of the spinal cord injury. Following discussion pt and family decided to treat the spinal cord injury medically and have surgery when she is in a less acute stage of injury/illness.  PCCM team contacted for ICU admission for vasopressor to maintain map greater than 85 and placement of arterial line for closer bp monitoring.     Significant labs: chloride 115/CO2 19/BUN 32/creatinine 1.46/calcium 8.6/albumin 3.4/hgb 9.8    CT Head/Cervical Spine:  No acute intracranial abnormality. Soft tissue swelling along the right parietal scalp. No evidence of an underlying calvarial fracture. No acute fracture or traumatic malalignment of the cervical spine. Multilevel degenerative changes with calcified disc bulges and ligamentum flavum hypertrophy that results in moderate to severe spinal canal narrowing at C2-C3 and at least moderate spinal canal narrowing at C3-C4. If the patient has new onset myelopathic symptoms, further evaluation with a cervical spine MRI is recommended.   MR Cervical Spine: No acute traumatic finding by MRI. No fracture or evidence of ligamentous injury. Severe spinal stenosis at C2-3 and C3-4 due to endplate osteophytes, bulging of the disc and posterior ligamentous prominence. AP diameter of the canal in the midline as narrow as 4.5- 4.7 mm. Effacement of the subarachnoid space and deformity of the cord. Patient would be at risk of compressive myelopathy. Bilateral foraminal stenosis also present at these levels. Moderate spinal stenosis at C4-5 and C5-6 due to endplate osteophytes, bulging of the disc and posterior ligamentous prominence. Narrowing of the subarachnoid space with slight indentation of the cord at C4-5. Bilateral foraminal stenosis at C4-5.  Please see "Significant Hospital Events" section below for full detailed hospital course.  Pertinent  Medical History   Past Medical History:  Diagnosis Date   Arthritis    Blood dyscrasia    patient stated she is a "free bleeder"   Diabetes mellitus without complication (HCC)    Diverticulosis    Hypothyroidism  Neuropathy    Renal insufficiency     Micro Data:  10/7: MRSA PCR>>negative 10/8: HIV screen>>negative  Antimicrobials:   Anti-infectives (From admission, onward)    None       Significant Hospital Events: Including procedures, antibiotic start and stop dates in addition to other pertinent events   10/7: Pt admitted  to ICU with a cervical spinal cord injury following a fall treating medically for now per pt and pts family request with vasopressor to maintain map greater than 85 and for arterial line placement for closer bp monitoring  10/8 +spinal cord injury. Requiring low dose Levophed to maintain MAP >85. Foley placed due to urinary retention. 10/9: With continued weakness and pain of RUE/LLE. Remains on low dose Levophed (2 mcg), weaning as tolerated. 10/10: No significant events noted overnight.  Improvement in function and pain of RUE.  Remains on low dose peripheral Levophed to maintain MAP >85.   Interim History / Subjective:  -No significant events noted overnight -Afebrile, hemodynamically stable, on room air -Is on 2 mcg Levophed to maintain MAP >85  -Continues to complain/exhibit RUE/RLE weakness and pain -Creatinine fairly stable at at 1.4 from 1.24 ~UOP 1 L last 24 hrs (net - 900 cc) -Improvement in movement/strength and pain of RUE, RLE remains weak and with pain with motion -At one point pt more confused ~ CT Head negative and ABG WNL ~ now resting comfortably  Objective   Blood pressure (!) 135/54, pulse 95, temperature 98.1 F (36.7 C), temperature source Oral, resp. rate 14, height 5\' 6"  (1.676 m), weight 64.7 kg, SpO2 95%.        Intake/Output Summary (Last 24 hours) at 04/16/2023 0711 Last data filed at 04/16/2023 0600 Gross per 24 hour  Intake 422.32 ml  Output 1055 ml  Net -632.68 ml   Filed Weights   04/14/23 0415 04/15/23 0414 04/16/23 0440  Weight: 63.9 kg 61.7 kg 64.7 kg    Examination: General: Acute on chronically ill-appearing female, laying in bed, on room air, no acute distress HENT: Normocephalic, neck supple, no JVD Lungs: Clear breath sounds throughout, even, nonlabored, normal effort Cardiovascular: Tachycardia, regular rhythm, S1-S2, no murmurs, rubs, gallops Abdomen: Soft, nontender, nondistended, no guarding or rebound tenderness, bowel sounds positive  x 4 Extremities: Right upper and lower extremities weak (2-3 out of 5) with pain as compared to the left, positive sensation in all 4 extremities Neuro: Sleeping, arouses easily to voice, alert and oriented x 4, RUE strength and pain improved,right lower extremities weaker as compared to the left (2-3 out of 5) with pain, positive sensation in all 4 extremities GU: Foley catheter in place draining yellow urine  Resolved Hospital Problem list     Assessment & Plan:   #Acute Cervical Spinal Cord Injury s/p fall per neurosurgery due to previous significant cervical stenosis noted on MRI this likely puts her at high risk for a central cord injury with a flexion mechanism given the back of the head strike  PMHx: Cervical stenosis, Neuropathy -Continuous cardiac monitoring -Maintain MAP >85 -Vasopressors as needed to maintain MAP goal -Neurosurgery following, appreciate input ~ Follow recommendation as below: -Neuro checks q2h -Cervical collar when up and ambulating, does not require strict bedrest -PT/OT -Foley catheter for urinary retention -Continue outpatient Gabapentin -Pain management  #CKD Stage III #Metabolic Acidosis -Monitor I&O's / urinary output -Follow BMP -Ensure adequate renal perfusion -Avoid nephrotoxic agents as able -Replace electrolytes as indicated ~ Pharmacy following for assistance with electrolyte replacement  #  Normocytic Normochromic Anemia without s/sx of acute blood loss -Monitor for S/Sx of bleeding -Trend CBC -Heparin SQ for VTE Prophylaxis  -Transfuse for Hgb <7  #Acute Fracture of the right superior and inferior pubic rami -Orthopedics consulted, appreciate input ~ recommended weightbearing as tolerated -PT/OT -Pain control  #Hypoglycemia #Hypothyroidism PMHx: Type II DM -CBG's q4h; Target range of 140 to 180 -SSI -Follow ICU Hypo/Hyperglycemia protocol -Hold outpatient glipizide, farxiga, metformin, and lantus for now -Continue outpatient  Synthroid  #Altered Mental Status: suspect ICU Delirium vs sleep deprivation ~ IMPROVED -Treatment of metabolic derangements as outlined above -Provide supportive care -Promote normal sleep/wake cycle and family presence -Avoid sedating medications as able -CT Head negative for acute intracranial abnormality -ABG WNL     Best Practice (right click and "Reselect all SmartList Selections" daily)   Diet/type: Regular consistency (see orders) DVT prophylaxis: prophylactic heparin  GI prophylaxis: N/A Lines: Arterial line, and is still needed Foley:  Yes, and it is still needed Code Status:  full code Last date of multidisciplinary goals of care discussion [10/9]  10/10: Pt updated at bedside on plan of care.  Will update her husband when he arrives at bedside.  Labs   CBC: Recent Labs  Lab 04/13/23 1134 04/14/23 0358 04/15/23 0457 04/16/23 0444  WBC 5.7 6.8 6.4 7.8  NEUTROABS 3.3  --   --   --   HGB 9.8* 9.2* 8.6* 8.8*  HCT 29.2* 26.4* 24.8* 26.7*  MCV 87.2 84.6 83.5 87.5  PLT 181 192 174 181    Basic Metabolic Panel: Recent Labs  Lab 04/13/23 1134 04/14/23 0358 04/15/23 0457 04/16/23 0444  NA 140 138 137 139  K 4.2 3.6 4.3 4.0  CL 115* 112* 108 107  CO2 19* 22 20* 21*  GLUCOSE 70 149* 130* 119*  BUN 32* 29* 30* 32*  CREATININE 1.46* 1.27* 1.24* 1.41*  CALCIUM 8.6* 8.2* 8.7* 8.7*  MG  --  2.1 1.9 2.1  PHOS  --  3.4 3.2 4.0   GFR: Estimated Creatinine Clearance: 37.2 mL/min (A) (by C-G formula based on SCr of 1.41 mg/dL (H)). Recent Labs  Lab 04/13/23 1134 04/14/23 0358 04/15/23 0457 04/16/23 0444  WBC 5.7 6.8 6.4 7.8    Liver Function Tests: Recent Labs  Lab 04/13/23 1134 04/16/23 0444  AST 30  --   ALT 22  --   ALKPHOS 72  --   BILITOT 0.6  --   PROT 7.3  --   ALBUMIN 3.4* 3.0*   No results for input(s): "LIPASE", "AMYLASE" in the last 168 hours. No results for input(s): "AMMONIA" in the last 168 hours.  ABG No results found for:  "PHART", "PCO2ART", "PO2ART", "HCO3", "TCO2", "ACIDBASEDEF", "O2SAT"   Coagulation Profile: No results for input(s): "INR", "PROTIME" in the last 168 hours.  Cardiac Enzymes: No results for input(s): "CKTOTAL", "CKMB", "CKMBINDEX", "TROPONINI" in the last 168 hours.  HbA1C: Hemoglobin A1C  Date/Time Value Ref Range Status  02/20/2013 04:02 AM 10.3 (H) 4.2 - 6.3 % Final    Comment:    The American Diabetes Association recommends that a primary goal of therapy should be <7% and that physicians should reevaluate the treatment regimen in patients with HbA1c values consistently >8%.    Hgb A1c MFr Bld  Date/Time Value Ref Range Status  04/13/2023 11:34 AM 5.9 (H) 4.8 - 5.6 % Final    Comment:    (NOTE) Pre diabetes:          5.7%-6.4%  Diabetes:              >  6.4%  Glycemic control for   <7.0% adults with diabetes   08/11/2021 08:39 PM 7.2 (H) 4.8 - 5.6 % Final    Comment:    (NOTE) Pre diabetes:          5.7%-6.4%  Diabetes:              >6.4%  Glycemic control for   <7.0% adults with diabetes     CBG: Recent Labs  Lab 04/14/23 2327 04/15/23 0709 04/15/23 1236 04/15/23 1530 04/15/23 2336  GLUCAP 112* 108* 125* 116* 100*    Review of Systems:   Positives in BOLD: Gen: Denies fever, chills, weight change, fatigue, night sweats HEENT: Denies blurred vision, double vision, hearing loss, tinnitus, sinus congestion, rhinorrhea, sore throat, neck stiffness, dysphagia PULM: Denies shortness of breath, cough, sputum production, hemoptysis, wheezing CV: Denies chest pain, edema, orthopnea, paroxysmal nocturnal dyspnea, palpitations GI: Denies abdominal pain, nausea, vomiting, diarrhea, hematochezia, melena, constipation, change in bowel habits GU: Denies dysuria, hematuria, polyuria, oliguria, urethral discharge Endocrine: Denies hot or cold intolerance, polyuria, polyphagia or appetite change Derm: Denies rash, dry skin, scaling or peeling skin change Heme: Denies  easy bruising, bleeding, bleeding gums Neuro: Denies headache, numbness, right sided weakness (RUE & RLE), slurred speech, loss of memory or consciousness   Past Medical History:  She,  has a past medical history of Arthritis, Blood dyscrasia, Diabetes mellitus without complication (HCC), Diverticulosis, Hypothyroidism, Neuropathy, and Renal insufficiency.   Surgical History:   Past Surgical History:  Procedure Laterality Date   ABDOMINAL HYSTERECTOMY     BILATERAL SALPINGECTOMY Bilateral 03/16/2017   Procedure: BILATERAL SALPINGECTOMY;  Surgeon: Schermerhorn, Ihor Austin, MD;  Location: [facility] ORS;  Service: Gynecology;  Laterality: Bilateral;   COLONOSCOPY WITH PROPOFOL N/A 08/18/2018   Procedure: COLONOSCOPY WITH PROPOFOL;  Surgeon: Scot Jun, MD;  Location: [facility] ENDOSCOPY;  Service: Endoscopy;  Laterality: N/A;   CYSTOCELE REPAIR N/A 03/16/2017   Procedure: ANTERIOR REPAIR (CYSTOCELE);  Surgeon: Schermerhorn, Ihor Austin, MD;  Location: [facility] ORS;  Service: Gynecology;  Laterality: N/A;   ESOPHAGOGASTRODUODENOSCOPY (EGD) WITH PROPOFOL N/A 08/18/2018   Procedure: ESOPHAGOGASTRODUODENOSCOPY (EGD) WITH PROPOFOL;  Surgeon: Scot Jun, MD;  Location: [facility] ENDOSCOPY;  Service: Endoscopy;  Laterality: N/A;   TEAR DUCT PROBING Right    TUBAL LIGATION     VAGINAL HYSTERECTOMY N/A 03/16/2017   Procedure: HYSTERECTOMY VAGINAL;  Surgeon: Schermerhorn, Ihor Austin, MD;  Location: [facility] ORS;  Service: Gynecology;  Laterality: N/A;   WISDOM TOOTH EXTRACTION       Social History:   reports that she has never smoked. She has never used smokeless tobacco. She reports that she does not drink alcohol and does not use drugs.   Family History:  Her family history includes Breast cancer in her maternal aunt; Diabetes in her father; Hypertension in her father and mother.   Allergies Allergies  Allergen Reactions   Augmentin [Amoxicillin-Pot Clavulanate] Itching   Ampicillin Rash     Home Medications   Prior to Admission medications   Medication Sig Start Date End Date Taking? Authorizing Provider  calcium carbonate (OS-CAL - DOSED IN MG OF ELEMENTAL CALCIUM) 1250 (500 Ca) MG tablet Take 1 tablet by mouth daily with breakfast.   Yes [provider]  cholecalciferol (VITAMIN D3) 25 MCG (1000 UNIT) tablet Take 1,000 Units by mouth daily.   Yes [provider]  ertugliflozin L-PyroglutamicAc (STEGLATRO) 15 MG TABS tablet Take 15 mg by mouth daily.   Yes [provider]  gabapentin (  NEURONTIN) 100 MG capsule Take 200 mg by mouth 2 (two) times daily.  08/16/15  Yes [provider]  glipiZIDE (GLUCOTROL) 5 MG tablet Take 5 mg by mouth daily before breakfast.   Yes [provider]  lactase (LACTAID) 3000 units tablet Take 3,000 Units by mouth daily before breakfast.   Yes [provider]  levothyroxine (SYNTHROID) 100 MCG tablet Take 100 mcg by mouth daily before breakfast.   Yes [provider]  omeprazole (PRILOSEC) 40 MG capsule Take 1 capsule (40 mg total) by mouth 2 (two) times daily. Take 30 min after Synthroid and 30 min before breakfast 08/21/22 04/14/23 Yes Pilar Jarvis, MD  oxybutynin (DITROPAN-XL) 5 MG 24 hr tablet Take 5 mg by mouth at bedtime.   Yes [provider]  psyllium (REGULOID) 0.52 g capsule Take 0.52 g by mouth daily.   Yes [provider]  rosuvastatin (CRESTOR) 5 MG tablet Take 5 mg by mouth daily.   Yes [provider]  vitamin B-12 (CYANOCOBALAMIN) 100 MCG tablet Take 100 mcg by mouth daily.   Yes [provider]  insulin glargine (LANTUS) 100 UNIT/ML Solostar Pen Inject 14 Units into the skin at bedtime. Patient not taking: Reported on 04/14/2023    [provider]  metoCLOPramide (REGLAN) 10 MG tablet Take 1 tablet (10 mg total) by mouth every 8 (eight) hours as needed for nausea or vomiting. Patient not taking: Reported on 04/14/2023 08/21/22 08/21/23  Dionne Bucy, MD   ondansetron (ZOFRAN-ODT) 4 MG disintegrating tablet Take 1 tablet (4 mg total) by mouth every 6 (six) hours as needed for nausea or vomiting. 03/21/22   Ward, Layla Maw, DO  oxyCODONE-acetaminophen (PERCOCET/ROXICET) 5-325 MG tablet Take 2 tablets by mouth every 6 (six) hours as needed. Patient not taking: Reported on 04/14/2023 03/21/22   Ward, Layla Maw, DO     Critical care time: 40 minutes     Harlon Ditty, AGACNP-BC [facility] Prefer epic messenger for cross cover needs If after hours, please call E-link

## 2023-04-16 NOTE — Plan of Care (Signed)
  Problem: Education: Goal: Ability to describe self-care measures that may prevent or decrease complications (Diabetes Survival Skills Education) will improve Outcome: Progressing   Problem: Coping: Goal: Ability to adjust to condition or change in health will improve Outcome: Progressing   Problem: Metabolic: Goal: Ability to maintain appropriate glucose levels will improve Outcome: Progressing   Problem: Skin Integrity: Goal: Risk for impaired skin integrity will decrease Outcome: Progressing   Problem: Tissue Perfusion: Goal: Adequacy of tissue perfusion will improve Outcome: Progressing   Problem: Education: Goal: Knowledge of General Education information will improve Description: Including pain rating scale, medication(s)/side effects and non-pharmacologic comfort measures Outcome: Progressing   Problem: Health Behavior/Discharge Planning: Goal: Ability to manage health-related needs will improve Outcome: Progressing   Problem: Clinical Measurements: Goal: Ability to maintain clinical measurements within normal limits will improve Outcome: Progressing Goal: Will remain free from infection Outcome: Progressing Goal: Diagnostic test results will improve Outcome: Progressing Goal: Respiratory complications will improve Outcome: Progressing Goal: Cardiovascular complication will be avoided Outcome: Progressing   Problem: Activity: Goal: Risk for activity intolerance will decrease Outcome: Progressing   Problem: Coping: Goal: Level of anxiety will decrease Outcome: Progressing

## 2023-04-16 NOTE — Care Management Important Message (Signed)
Important Message  Patient Details  Name: [name] MRN: [id number] Date of Birth: 03-14-1956   Important Message Given:  N/A - LOS <3 / Initial given by admissions     [name] [name] 04/16/2023, 12:00 PM

## 2023-04-16 NOTE — Progress Notes (Signed)
Physical Therapy Treatment Patient Details Name: [name] MRN: [id number] DOB: 11-11-1955 Today's Date: 04/16/2023   History of Present Illness Pt is a 67 year old female presents with the chief complaint of cervical spinal cord injury after a fall, admitted with cervical spinal cord injury central vs hemicord pattern; Acute Fracture of the right superior and inferior pubic rami- WBAT    PMH significant for Arthritis, Blood dyscrasia, Diabetes mellitus without complication (HCC), Diverticulosis, Hypothyroidism, Neuropathy, and Renal insufficiency.    PT Comments  Pt was received in bed and agreed to PT session. Pt required MinA+2 for bed mobility to sit at EOB. When performing bed mobility, pt expressed having pain in their right hip and left shoulder on a pain scale of 5/10. While sitting at EOB, pt reported feeling light headed. A rest break was needed prior to standing at EOB. Pt stood EOB with the use of RW (2wheels) MinA+2. While standing, pt reported feeling light headed. Prior to sitting down, pt took 5 lateral steps towards HOB CGA+2. Before sitting down, pt expressed that they had a headache. Pt sat at EOB and performed bed mobility MinA+2 to complete the session. Throughout the session, pt expressed and demonstrated their right hip to be a limiting factor due to pain. Constant verbal cues were necessary to ensure pt kept their eyes open while participating in PT session. Pt tolerated Tx fair today and will continue to benefit from skilled PT session to improve functional mobility.    If plan is discharge home, recommend the following: A lot of help with walking and/or transfers;A lot of help with bathing/dressing/bathroom;Assist for transportation;Help with stairs or ramp for entrance   Can travel by private vehicle        Equipment Recommendations  Rolling walker (2 wheels)    Recommendations for Other Services       Precautions / Restrictions Precautions Precautions:  Cervical Precaution Booklet Issued: No Precaution Comments: c/s [place] collar at bedside Required Braces or Orthoses: Cervical Brace Cervical Brace: Hard collar Restrictions Weight Bearing Restrictions: No Other Position/Activity Restrictions: per orders, ok to donn collar edge of bed, amb to bathroom/shower without c collar     Mobility  Bed Mobility Overal bed mobility: Needs Assistance Bed Mobility: Sit to Supine, Supine to Sit     Supine to sit: Min assist, +2 for physical assistance Sit to supine: Min assist, +2 for physical assistance   General bed mobility comments: Pt performed bed mobility to sit at EOB MinA+2. Pt reported feeling light headed and needed a rest prior to standing at EOB. Pt experienced Right hip pain when performing bed mobility on a pain scale of 5/10. To get back into bed, Pt continued to require MinA+2.    Transfers Overall transfer level: Needs assistance Equipment used: Rolling walker (2 wheels) Transfers: Sit to/from Stand Sit to Stand: Min assist, +2 physical assistance           General transfer comment: Pt performed STS at EOB with the use of RW (2wheels) MinA+2. Verbal cues were necessary as pt was standing to ensure eyes stayed open during activity. Pt reported consistant right hip pain throughout session on a pain scale of 5/10. While standing, pt reported feeling light headed.    Ambulation/Gait Ambulation/Gait assistance: Contact guard assist, +2 physical assistance Gait Distance (Feet): 5 Feet Assistive device: Rolling walker (2 wheels) Gait Pattern/deviations: Step-to pattern Gait velocity: decreased     General Gait Details: Prior to sitting back at EOB, pt performed  5 lateral steps CGA+2 to the [facility] that caused further discomfort in pt's right hip. Before sitting down, pt reported having a headache towards the end of the session. To sit back at the EOB and bed mobility to end the session, pt required MinA+2.   Stairs              Wheelchair Mobility     Tilt Bed    Modified Rankin (Stroke Patients Only)       Balance Overall balance assessment: Needs assistance Sitting-balance support: Bilateral upper extremity supported, Feet supported Sitting balance-Leahy Scale: Fair     Standing balance support: Bilateral upper extremity supported, During functional activity, Reliant on assistive device for balance                                Cognition Arousal: Alert, Lethargic Behavior During Therapy: WFL for tasks assessed/performed Overall Cognitive Status: Impaired/Different from baseline Area of Impairment: Attention                   Current Attention Level: Focused   Following Commands: Follows one step commands with increased time     Problem Solving: Requires verbal cues, Requires tactile cues General Comments: Pt required constant verbal cues throughout session to keep eyes open        Exercises Other Exercises Other Exercises: Lateral steps towards HOB- 5 steps    General Comments General comments (skin integrity, edema, etc.): A lines and leads intact pre/post session, vss throughout tx.      Pertinent Vitals/Pain Pain Assessment Pain Assessment: 0-10 Pain Score: 5  Pain Location: Pain at her Right hip and at her Left shoulder Pain Descriptors / Indicators: Aching, Discomfort, Moaning, Grimacing Pain Intervention(s): Monitored during session    Home Living                          Prior Function            PT Goals (current goals can now be found in the care plan section) Acute Rehab PT Goals Patient Stated Goal: to go home and see puppy PT Goal Formulation: With patient Time For Goal Achievement: 04/29/23 Potential to Achieve Goals: Good Progress towards PT goals: Progressing toward goals    Frequency    Min 1X/week      PT Plan      Co-evaluation              AM-PAC PT "6 Clicks" Mobility   Outcome Measure   Help needed turning from your back to your side while in a flat bed without using bedrails?: A Little Help needed moving from lying on your back to sitting on the side of a flat bed without using bedrails?: A Little Help needed moving to and from a bed to a chair (including a wheelchair)?: A Little Help needed standing up from a chair using your arms (e.g., wheelchair or bedside chair)?: A Little Help needed to walk in hospital room?: A Lot Help needed climbing 3-5 steps with a railing? : Total 6 Click Score: 15    End of Session Equipment Utilized During Treatment: Gait belt Activity Tolerance: Patient limited by pain Patient left: in bed;with call bell/phone within reach;with bed alarm set;with family/visitor present Nurse Communication: Mobility status PT Visit Diagnosis: Unsteadiness on feet (R26.81);Muscle weakness (generalized) (M62.81);Pain;Dizziness and giddiness (R42) Pain - Right/Left: Right Pain - part  of body: Hip;Shoulder (Shoulder pain is on the left)     Time: 4540-9811 PT Time Calculation (min) (ACUTE ONLY): 27 min  Charges:    $Gait Training: 8-22 mins $Therapeutic Activity: 8-22 mins PT General Charges $$ ACUTE PT VISIT: 1 Visit                     Katheline Brendlinger Sauvignon Howard SPT, LAT, ATC    Margaretta Chittum Sauvignon-Howard 04/16/2023, 4:23 PM

## 2023-04-16 NOTE — Progress Notes (Signed)
Occupational Therapy Treatment Patient Details Name: [name] MRN: [id number] DOB: Aug 22, 1955 Today's Date: 04/16/2023   History of present illness Pt is a 67 year old female presents with the chief complaint of cervical spinal cord injury after a fall, admitted with cervical spinal cord injury central vs hemicord pattern; Acute Fracture of the right superior and inferior pubic rami- WBAT    PMH significant for Arthritis, Blood dyscrasia, Diabetes mellitus without complication (HCC), Diverticulosis, Hypothyroidism, Neuropathy, and Renal insufficiency.   OT comments  Pt chart reviewed prior to session. Pt seen for OT treatment on this date. Tx session targeted improved activity tolerance for ADL tasks. Pt asleep upon arrival to room, verbal/tactile cues required throughout session to keep her eyes open. Pt husband and son in room during session. Pt/family reported she has not ate much today due to no appetite. Bed mobility; Pt requires MIN A for LE management + lines and leads going from supine <> sit on EOB. Cervical collar donned with MAX A. Pt completed seated grooming task, cleaning her face with a wash cloth on EOB with MIN A Pt able to STS at EOB with RW + MIN A. Pt stood for approx 30 seconds. Vss monitored throughout session, appear stable throughout. Due to noted B hand weakness, built up utensil materials in Pt room - Nurse was notified. Pt making good progress toward goals, will continue to follow POC. Discharge recommendation remains appropriate.  OT will follow acutely.      If plan is discharge home, recommend the following:  A lot of help with walking and/or transfers;A lot of help with bathing/dressing/bathroom;Assistance with feeding;Direct supervision/assist for financial management;Help with stairs or ramp for entrance;Direct supervision/assist for medications management   Equipment Recommendations  Other (comment)    Recommendations for Other Services      Precautions /  Restrictions Precautions Precautions: Cervical Precaution Booklet Issued: No Precaution Comments: c/s [place] collar at bedside Required Braces or Orthoses: Cervical Brace Cervical Brace: Hard collar Restrictions Weight Bearing Restrictions: No Other Position/Activity Restrictions: per orders, ok to donn collar edge of bed, amb to bathroom/shower without c collar       Mobility Bed Mobility Overal bed mobility: Needs Assistance Bed Mobility: Sit to Supine, Supine to Sit     Supine to sit: Min assist Sit to supine: Min assist        Transfers                         Balance Overall balance assessment: Needs assistance Sitting-balance support: Bilateral upper extremity supported, Feet supported Sitting balance-Leahy Scale: Fair Sitting balance - Comments: able to maintain seated EOB balance with BUE support   Standing balance support: Bilateral upper extremity supported, During functional activity, Reliant on assistive device for balance Standing balance-Leahy Scale: Fair Standing balance comment: heavy reliance on AD; cues to promote upright posture                           ADL either performed or assessed with clinical judgement   ADL Overall ADL's : Needs assistance/impaired     Grooming: Wash/dry face;Sitting; MIN A, weak grip strength  Grooming Details (indicate cue type and reason): At [facility]                       Toileting - Clothing Manipulation Details (indicate cue type and reason): foley       General  ADL Comments: Noted UE continues to be weak during grooming task    Extremity/Trunk Assessment              Vision Patient Visual Report: No change from baseline     Perception     Praxis      Cognition Arousal: Alert, Lethargic Behavior During Therapy: WFL for tasks assessed/performed Overall Cognitive Status: Within Functional Limits for tasks assessed Area of Impairment: Awareness                            Awareness: Emergent Problem Solving: Requires verbal cues, Requires tactile cues General Comments: Pt required constant verbal cues throughout session to keep eyes open        Exercises      Shoulder Instructions       General Comments A lines and leads intact pre/post session, vss throughout tx.    Pertinent Vitals/ Pain       Pain Assessment Pain Assessment: Faces Faces Pain Scale: Hurts a little bit Pain Location: R Hip Pain Descriptors / Indicators: Aching, Discomfort, Moaning, Grimacing Pain Intervention(s): Limited activity within patient's tolerance, Monitored during session, Repositioned, Relaxation  Home Living                                          Prior Functioning/Environment              Frequency  Min 1X/week        Progress Toward Goals  OT Goals(current goals can now be found in the care plan section)  Progress towards OT goals: Progressing toward goals  Acute Rehab OT Goals Patient Stated Goal: rehab OT Goal Formulation: With patient Time For Goal Achievement: 04/29/23 Potential to Achieve Goals: Good  Plan      Co-evaluation                 AM-PAC OT "6 Clicks" Daily Activity     Outcome Measure   Help from another person eating meals?: A Lot Help from another person taking care of personal grooming?: A Lot Help from another person toileting, which includes using toliet, bedpan, or urinal?: A Lot Help from another person bathing (including washing, rinsing, drying)?: A Lot Help from another person to put on and taking off regular upper body clothing?: A Lot Help from another person to put on and taking off regular lower body clothing?: A Lot 6 Click Score: 12    End of Session Equipment Utilized During Treatment: Rolling walker (2 wheels)  OT Visit Diagnosis: Muscle weakness (generalized) (M62.81);Other abnormalities of gait and mobility (R26.89)   Activity Tolerance Patient tolerated  treatment well   Patient Left in bed;with call bell/phone within reach;with bed alarm set;with family/visitor present   Nurse Communication Mobility status (vitals, utencils build up materal left in room)        Time: 2952-8413 OT Time Calculation (min): 22 min  Charges: OT General Charges $OT Visit: 1 Visit OT Treatments $Therapeutic Activity: 8-22 mins  [employer], OTS

## 2023-04-16 NOTE — Progress Notes (Signed)
Patient A/O x4, stable pressures, afebrile, on room air.   Patient repositioned in bed for breakfast. After positional change, patient complained of headache and feeling like she was going to pass out. BG and BP WNL. MD made aware.  Entered patients room, patients spouse feeding her eggs. Patient is very drowsy and not staying alert, coughing. Education provided to family. Mouth suctioned and clear.   Patient to CT, with C Collar on. Vitals stable. Tolerated well.   Medications held while patient too drowsy to swallow. MD aware.    Patient more alert this evening, feeding self in bed with modified utensils.

## 2023-04-17 DIAGNOSIS — G9520 Unspecified cord compression: Secondary | ICD-10-CM

## 2023-04-17 LAB — CBC
HCT: 25.9 % — ABNORMAL LOW (ref 36.0–46.0)
Hemoglobin: 8.7 g/dL — ABNORMAL LOW (ref 12.0–15.0)
MCH: 29 pg (ref 26.0–34.0)
MCHC: 33.6 g/dL (ref 30.0–36.0)
MCV: 86.3 fL (ref 80.0–100.0)
Platelets: 185 10*3/uL (ref 150–400)
RBC: 3 MIL/uL — ABNORMAL LOW (ref 3.87–5.11)
RDW: 14.2 % (ref 11.5–15.5)
WBC: 8.8 10*3/uL (ref 4.0–10.5)
nRBC: 0 % (ref 0.0–0.2)

## 2023-04-17 LAB — RENAL FUNCTION PANEL
Albumin: 3.2 g/dL — ABNORMAL LOW (ref 3.5–5.0)
Anion gap: 18 — ABNORMAL HIGH (ref 5–15)
BUN: 34 mg/dL — ABNORMAL HIGH (ref 8–23)
CO2: 18 mmol/L — ABNORMAL LOW (ref 22–32)
Calcium: 8.8 mg/dL — ABNORMAL LOW (ref 8.9–10.3)
Chloride: 105 mmol/L (ref 98–111)
Creatinine, Ser: 1.22 mg/dL — ABNORMAL HIGH (ref 0.44–1.00)
GFR, Estimated: 49 mL/min — ABNORMAL LOW (ref >60)
Glucose, Bld: 106 mg/dL — ABNORMAL HIGH (ref 70–99)
Phosphorus: 3.7 mg/dL (ref 2.5–4.6)
Potassium: 4.5 mmol/L (ref 3.5–5.1)
Sodium: 141 mmol/L (ref 135–145)

## 2023-04-17 LAB — GLUCOSE, CAPILLARY
Glucose-Capillary: 94 mg/dL (ref 70–99)
Glucose-Capillary: 130 mg/dL — ABNORMAL HIGH (ref 70–99)
Glucose-Capillary: 187 mg/dL — ABNORMAL HIGH (ref 70–99)
Glucose-Capillary: 123 mg/dL — ABNORMAL HIGH (ref 70–99)

## 2023-04-17 LAB — MAGNESIUM: Magnesium: 2 mg/dL (ref 1.7–2.4)

## 2023-04-17 NOTE — Care Plan (Signed)
Pt's husband updated at bedside on plan of care.  All questions answered, he is appreciative of update.     Harlon Ditty, AGACNP-BC [facility] Prefer epic messenger for cross cover needs If after hours, please call E-link

## 2023-04-17 NOTE — Consult Note (Signed)
PHARMACY CONSULT NOTE - ELECTROLYTES  Pharmacy Consult for Electrolyte Monitoring and Replacement   Recent Labs: Height: 5\' 6"  (167.6 cm) Weight: 60.6 kg (133 lb 9.6 oz) IBW/kg (Calculated) : 59.3 Estimated Creatinine Clearance: 43 mL/min (A) (by C-G formula based on SCr of 1.22 mg/dL (H)). Potassium (mmol/L)  Date Value  04/17/2023 4.5  02/21/2013 3.9   Magnesium (mg/dL)  Date Value  40/98/1191 2.0  02/19/2013 1.6 (L)   Calcium (mg/dL)  Date Value  47/82/9562 8.8 (L)   Calcium, Total (mg/dL)  Date Value  13/02/6577 8.1 (L)   Albumin (g/dL)  Date Value  46/96/2952 3.2 (L)  02/19/2013 4.2   Phosphorus (mg/dL)  Date Value  84/13/2440 3.7   Sodium (mmol/L)  Date Value  04/17/2023 141  02/21/2013 142    Assessment  [name] is a 67 y.o. female presenting with cervical spinal cord injury after a fall.  PMH significant for arthritis, DM, diverticulitis, CKD. Pharmacy has been consulted to monitor and replace electrolytes.  Diet: none  Goal of Therapy: Electrolytes WNL  Plan:  No replacement needed at this time Check BMP with AM labs  Thank you for allowing pharmacy to be a part of this patient's care.  Elliot Gurney, PharmD, BCPS Clinical Pharmacist  04/17/2023 7:49 AM

## 2023-04-17 NOTE — Progress Notes (Signed)
Physical Therapy Treatment Patient Details Name: [name] MRN: [id number] DOB: 05-25-1956 Today's Date: 04/17/2023   History of Present Illness Pt is a 67 year old female presents with the chief complaint of cervical spinal cord injury after a fall, admitted with cervical spinal cord injury central vs hemicord pattern; Acute Fracture of the right superior and inferior pubic rami- WBAT    PMH significant for Arthritis, Blood dyscrasia, Diabetes mellitus without complication (HCC), Diverticulosis, Hypothyroidism, Neuropathy, and Renal insufficiency.    PT Comments  Patient is agreeable to PT session with encouragement. Patient continues to require assistance and cues with all mobility. 2 bouts of standing performed with emphasis on upright standing posture, taking side steps, and increasing activity tolerance in preparation for progressive ambulation. MAP does decrease with standing activity and nurse titrated levophed to maintain MAP goals. No dizziness is reported with upright activity. Recommend to continue PT to maximize independence and facilitate return to prior level of function. Anticipate patient will need intensive rehabilitation > 3 hours/day after this hospital stay.     If plan is discharge home, recommend the following: A lot of help with walking and/or transfers;A lot of help with bathing/dressing/bathroom;Assist for transportation;Help with stairs or ramp for entrance   Can travel by private vehicle        Equipment Recommendations  Rolling walker (2 wheels)    Recommendations for Other Services       Precautions / Restrictions Precautions Precautions: Cervical Precaution Comments: MAP>85, right femoral A-line Required Braces or Orthoses: Cervical Brace Cervical Brace: Hard collar Restrictions Weight Bearing Restrictions: No Other Position/Activity Restrictions: per orders, ok to donn collar edge of bed, amb to bathroom/shower without c collar     Mobility  Bed  Mobility Overal bed mobility: Needs Assistance Bed Mobility: Sit to Supine, Supine to Sit     Supine to sit: Used rails, Mod assist Sit to supine: Max assist   General bed mobility comments: increased time and effort required. cues for logroll technique, reaching across midline, using bed rails to facilitae movement    Transfers Overall transfer level: Needs assistance Equipment used: None Transfers: Sit to/from Stand Sit to Stand: Min assist, Mod assist, +2 safety/equipment           General transfer comment: 2 standing bouts peformed. lifting assistance requried for standing    Ambulation/Gait             Pre-gait activities: emphasis on upright standing posture, patient is able to take 3 small side steps to the left with 2 person HHA, Mod A. pain with weight bearing on right leg/hip. MAP decreased to 50/60's with standing (no reported dizziness) and nurse titrated levophed for MAP goals during mobility efforts.     Stairs             Wheelchair Mobility     Tilt Bed    Modified Rankin (Stroke Patients Only)       Balance Overall balance assessment: Needs assistance Sitting-balance support: Bilateral upper extremity supported, Feet supported Sitting balance-Leahy Scale: Good     Standing balance support: Bilateral upper extremity supported, During functional activity, Reliant on assistive device for balance Standing balance-Leahy Scale: Poor Standing balance comment: external support required to maintain standing balance                            Cognition Arousal: Lethargic Behavior During Therapy: Flat affect Overall Cognitive Status: Impaired/Different from baseline Area of  Impairment: Attention, Following commands, Awareness, Problem solving                   Current Attention Level: Focused   Following Commands: Follows one step commands with increased time   Awareness: Emergent Problem Solving: Requires verbal  cues, Requires tactile cues, Slow processing General Comments: Pt required constant verbal cues throughout session to keep eyes open        Exercises      General Comments General comments (skin integrity, edema, etc.): C-collar donned/doffed in sitting position. patient may benefit from compression hose and/or abdominal binder if blood pressure continues to drop with mobility efforts      Pertinent Vitals/Pain Pain Assessment Pain Assessment: 0-10 Pain Score: 10-Worst pain ever Pain Location: R Hip, R arm Pain Descriptors / Indicators: Aching, Discomfort, Moaning, Grimacing Pain Intervention(s): Limited activity within patient's tolerance, Monitored during session, Repositioned    Home Living                          Prior Function            PT Goals (current goals can now be found in the care plan section) Acute Rehab PT Goals Patient Stated Goal: to go home and see her dog PT Goal Formulation: With patient Time For Goal Achievement: 04/29/23 Potential to Achieve Goals: Good Progress towards PT goals: Progressing toward goals    Frequency    Min 1X/week      PT Plan      Co-evaluation PT/OT/SLP Co-Evaluation/Treatment: Yes Reason for Co-Treatment: Complexity of the patient's impairments (multi-system involvement);Necessary to address cognition/behavior during functional activity;For patient/therapist safety PT goals addressed during session: Mobility/safety with mobility OT goals addressed during session: ADL's and self-care;Proper use of Adaptive equipment and DME      AM-PAC PT "6 Clicks" Mobility   Outcome Measure  Help needed turning from your back to your side while in a flat bed without using bedrails?: A Little Help needed moving from lying on your back to sitting on the side of a flat bed without using bedrails?: A Little Help needed moving to and from a bed to a chair (including a wheelchair)?: A Little Help needed standing up from a  chair using your arms (e.g., wheelchair or bedside chair)?: A Little Help needed to walk in hospital room?: A Lot Help needed climbing 3-5 steps with a railing? : Total 6 Click Score: 15    End of Session Equipment Utilized During Treatment: Cervical collar Activity Tolerance: Patient tolerated treatment well;Patient limited by pain Patient left: in bed;with call bell/phone within reach;with bed alarm set;with family/visitor present Nurse Communication: Mobility status PT Visit Diagnosis: Unsteadiness on feet (R26.81);Muscle weakness (generalized) (M62.81);Pain;Dizziness and giddiness (R42)     Time: 1610-9604 PT Time Calculation (min) (ACUTE ONLY): 32 min  Charges:    $Therapeutic Activity: 8-22 mins PT General Charges $$ ACUTE PT VISIT: 1 Visit                     Donna Bernard, PT, MPT    Ina Homes 04/17/2023, 11:30 AM

## 2023-04-17 NOTE — Progress Notes (Signed)
Inpatient Rehab Coordinator Note:  I spoke with patient's spouse over the phone to discuss CIR recommendations and goals/expectations of CIR stay.  We reviewed 3 hrs/day of therapy, physician follow up, and average length of stay 2 weeks (dependent upon progress) with goals of supervision to min assist.  He states he is not supposed to be driving and wants to consider a facility closer to home.  I explained the differences between CIR and SNF, including the benefits of coordinated team approach to rehab and intensity available at [facility].  He states he wishes there was someway she could do outpatient therapy.  Its not quite clear that he fully understands her injury/deficits.  He does confirm he can provide assist when she ultimately comes home.  I will f/u with them on Monday.    Estill Dooms, PT, DPT Admissions Coordinator [phone] 04/17/23  2:52 PM

## 2023-04-17 NOTE — Consult Note (Signed)
Physical Medicine and Rehabilitation Consult Reason for Consult: Post-traumatic spinal cord compression Referring Physician: Rhea Bleacher, MD   HPI: [name] is a 67 y.o. female who presents with a cervical spinal cord injury after a fall, and was admitted with a cervical spinal cord injury in a central versus hemicord pattern. She was also found to have acute fractures of the right superior and inferior public rami and is WBAT. PMH is  significant for arthritis, blood dyscrasia, diabetes mellitus, diverticulosis, hypothyroidism, neuropathy, and renal insufficiency. Physical Medicine & Rehabilitation was consulted to assess candidacy for CIR.    ROS +RUE weakness and pain Past Medical History:  Diagnosis Date   Arthritis    Blood dyscrasia    patient stated she is a "free bleeder"   Diabetes mellitus without complication (HCC)    Diverticulosis    Hypothyroidism    Neuropathy    Renal insufficiency    Past Surgical History:  Procedure Laterality Date   ABDOMINAL HYSTERECTOMY     BILATERAL SALPINGECTOMY Bilateral 03/16/2017   Procedure: BILATERAL SALPINGECTOMY;  Surgeon: Schermerhorn, Ihor Austin, MD;  Location: [facility] ORS;  Service: Gynecology;  Laterality: Bilateral;   COLONOSCOPY WITH PROPOFOL N/A 08/18/2018   Procedure: COLONOSCOPY WITH PROPOFOL;  Surgeon: Scot Jun, MD;  Location: [facility] ENDOSCOPY;  Service: Endoscopy;  Laterality: N/A;   CYSTOCELE REPAIR N/A 03/16/2017   Procedure: ANTERIOR REPAIR (CYSTOCELE);  Surgeon: Schermerhorn, Ihor Austin, MD;  Location: [facility] ORS;  Service: Gynecology;  Laterality: N/A;   ESOPHAGOGASTRODUODENOSCOPY (EGD) WITH PROPOFOL N/A 08/18/2018   Procedure: ESOPHAGOGASTRODUODENOSCOPY (EGD) WITH PROPOFOL;  Surgeon: Scot Jun, MD;  Location: [facility] ENDOSCOPY;  Service: Endoscopy;  Laterality: N/A;   TEAR DUCT PROBING Right    TUBAL LIGATION     VAGINAL HYSTERECTOMY N/A 03/16/2017   Procedure: HYSTERECTOMY VAGINAL;  Surgeon: Schermerhorn,  Ihor Austin, MD;  Location: [facility] ORS;  Service: Gynecology;  Laterality: N/A;   WISDOM TOOTH EXTRACTION     Family History  Problem Relation Age of Onset   Breast cancer Maternal Aunt    Hypertension Mother    Hypertension Father    Diabetes Father    Social History:  reports that she has never smoked. She has never used smokeless tobacco. She reports that she does not drink alcohol and does not use drugs. Allergies:  Allergies  Allergen Reactions   Augmentin [Amoxicillin-Pot Clavulanate] Itching   Ampicillin Rash   Medications Prior to Admission  Medication Sig Dispense Refill   calcium carbonate (OS-CAL - DOSED IN MG OF ELEMENTAL CALCIUM) 1250 (500 Ca) MG tablet Take 1 tablet by mouth daily with breakfast.     cholecalciferol (VITAMIN D3) 25 MCG (1000 UNIT) tablet Take 1,000 Units by mouth daily.     ertugliflozin L-PyroglutamicAc (STEGLATRO) 15 MG TABS tablet Take 15 mg by mouth daily.     gabapentin (NEURONTIN) 100 MG capsule Take 200 mg by mouth 2 (two) times daily.      glipiZIDE (GLUCOTROL) 5 MG tablet Take 5 mg by mouth daily before breakfast.     lactase (LACTAID) 3000 units tablet Take 3,000 Units by mouth daily before breakfast.     levothyroxine (SYNTHROID) 100 MCG tablet Take 100 mcg by mouth daily before breakfast.     omeprazole (PRILOSEC) 40 MG capsule Take 1 capsule (40 mg total) by mouth 2 (two) times daily. Take 30 min after Synthroid and 30 min before breakfast 60 capsule 2   oxybutynin (DITROPAN-XL) 5 MG 24 hr tablet  Take 5 mg by mouth at bedtime.     psyllium (REGULOID) 0.52 g capsule Take 0.52 g by mouth daily.     rosuvastatin (CRESTOR) 5 MG tablet Take 5 mg by mouth daily.     vitamin B-12 (CYANOCOBALAMIN) 100 MCG tablet Take 100 mcg by mouth daily.     insulin glargine (LANTUS) 100 UNIT/ML Solostar Pen Inject 14 Units into the skin at bedtime. (Patient not taking: Reported on 04/14/2023)     metoCLOPramide (REGLAN) 10 MG tablet Take 1 tablet (10 mg total) by  mouth every 8 (eight) hours as needed for nausea or vomiting. (Patient not taking: Reported on 04/14/2023) 20 tablet 1   ondansetron (ZOFRAN-ODT) 4 MG disintegrating tablet Take 1 tablet (4 mg total) by mouth every 6 (six) hours as needed for nausea or vomiting. 20 tablet 0   oxyCODONE-acetaminophen (PERCOCET/ROXICET) 5-325 MG tablet Take 2 tablets by mouth every 6 (six) hours as needed. (Patient not taking: Reported on 04/14/2023) 20 tablet 0    Home: Home Living Family/patient expects to be discharged to:: Private residence Living Arrangements: Spouse/significant other, Children Available Help at Discharge: Family Type of Home: Apartment Home Access: Level entry Home Layout: One level Bathroom Shower/Tub: Proofreader: None  Functional History: Prior Function Prior Level of Function : Working/employed, Driving Mobility Comments: Pt reports Independent with mobility and 1 fall last year ADLs Comments: Getting dog today, husaband works fulltime, son works part time, Pt reports independent with all ADL/IADL prior to hospital admission. Functional Status:  Mobility: Bed Mobility Overal bed mobility: Needs Assistance Bed Mobility: Sit to Supine, Supine to Sit Supine to sit: Used rails, Mod assist Sit to supine: Max assist General bed mobility comments: Pt performed bed mobility to sit at EOB MinA+2. Pt reported feeling light headed and needed a rest prior to standing at EOB. Pt experienced Right hip pain when performing bed mobility on a pain scale of 5/10. To get back into bed, Pt continued to require MinA+2. Transfers Overall transfer level: Needs assistance Equipment used: None Transfers: Sit to/from Stand Sit to Stand: Min assist, Mod assist, +2 safety/equipment General transfer comment: two attempts, MOD A +2 for latearl steps up the bed, limited by pain Ambulation/Gait Ambulation/Gait assistance: Contact guard assist, +2 physical assistance Gait Distance (Feet):  5 Feet Assistive device: Rolling walker (2 wheels) Gait Pattern/deviations: Step-to pattern General Gait Details: Prior to sitting back at EOB, pt performed 5 lateral steps CGA+2 to the [facility] that caused further discomfort in pt's right hip. Before sitting down, pt reported having a headache towards the end of the session. To sit back at the EOB and bed mobility to end the session, pt required MinA+2. Gait velocity: decreased    ADL: ADL Overall ADL's : Needs assistance/impaired Eating/Feeding: Maximal assistance, Sitting Grooming: Minimal assistance, Sitting, Wash/dry face, Oral care Grooming Details (indicate cue type and reason): MAX A to brush hair at edge of bed; pt able to bring R hand to mouth/forehad, increased time and frequent vcs for attention to task Upper Body Dressing : Maximal assistance Upper Body Dressing Details (indicate cue type and reason): collar Lower Body Dressing: Maximal assistance Toileting - Clothing Manipulation Details (indicate cue type and reason): foley General ADL Comments: Noted UE continues to be weak during grooming task  Cognition: Cognition Overall Cognitive Status: Impaired/Different from baseline Orientation Level: Oriented X4 Cognition Arousal: Lethargic Behavior During Therapy: Flat affect Overall Cognitive Status: Impaired/Different from baseline Area of Impairment: Attention, Following commands, Awareness, Problem  solving Current Attention Level: Focused Following Commands: Follows one step commands with increased time Awareness: Emergent Problem Solving: Requires verbal cues, Requires tactile cues, Slow processing General Comments: Pt required constant verbal cues throughout session to keep eyes open Difficult to assess due to: Level of arousal  Blood pressure 119/60, pulse (!) 101, temperature 98.1 F (36.7 C), temperature source Oral, resp. rate 20, height 5\' 6"  (1.676 m), weight 60.6 kg, SpO2 96%. Physical Exam Gen: no distress,  normal appearing HEENT: oral mucosa pink and moist, NCAT Cardio: Tachycardia Chest: normal effort, normal rate of breathing Abd: soft, non-distended Ext: no edema Psych: pleasant, normal affect Skin: intact Neuro: RUE weakness- 3/5 strength throughout  Results for orders placed or performed during the hospital encounter of 04/13/23 (from the past 24 hour(s))  Glucose, capillary     Status: Abnormal   Collection Time: 04/16/23 12:35 PM  Result Value Ref Range   Glucose-Capillary 130 (H) 70 - 99 mg/dL  Glucose, capillary     Status: Abnormal   Collection Time: 04/16/23  3:30 PM  Result Value Ref Range   Glucose-Capillary 122 (H) 70 - 99 mg/dL  Glucose, capillary     Status: None   Collection Time: 04/16/23 10:19 PM  Result Value Ref Range   Glucose-Capillary 98 70 - 99 mg/dL  CBC     Status: Abnormal   Collection Time: 04/17/23  3:57 AM  Result Value Ref Range   WBC 8.8 4.0 - 10.5 K/uL   RBC 3.00 (L) 3.87 - 5.11 MIL/uL   Hemoglobin 8.7 (L) 12.0 - 15.0 g/dL   HCT 57.8 (L) 46.9 - 62.9 %   MCV 86.3 80.0 - 100.0 fL   MCH 29.0 26.0 - 34.0 pg   MCHC 33.6 30.0 - 36.0 g/dL   RDW 52.8 41.3 - 24.4 %   Platelets 185 150 - 400 K/uL   nRBC 0.0 0.0 - 0.2 %  Renal function panel     Status: Abnormal   Collection Time: 04/17/23  3:57 AM  Result Value Ref Range   Sodium 141 135 - 145 mmol/L   Potassium 4.5 3.5 - 5.1 mmol/L   Chloride 105 98 - 111 mmol/L   CO2 18 (L) 22 - 32 mmol/L   Glucose, Bld 106 (H) 70 - 99 mg/dL   BUN 34 (H) 8 - 23 mg/dL   Creatinine, Ser 0.10 (H) 0.44 - 1.00 mg/dL   Calcium 8.8 (L) 8.9 - 10.3 mg/dL   Phosphorus 3.7 2.5 - 4.6 mg/dL   Albumin 3.2 (L) 3.5 - 5.0 g/dL   GFR, Estimated 49 (L) >60 mL/min   Anion gap 18 (H) 5 - 15  Magnesium     Status: None   Collection Time: 04/17/23  3:57 AM  Result Value Ref Range   Magnesium 2.0 1.7 - 2.4 mg/dL  Glucose, capillary     Status: None   Collection Time: 04/17/23  7:58 AM  Result Value Ref Range    Glucose-Capillary 94 70 - 99 mg/dL   CT HEAD WO CONTRAST ( )  Result Date: 04/16/2023 CLINICAL DATA:  67 year old female with altered mental status. EXAM: CT HEAD WITHOUT CONTRAST TECHNIQUE: Contiguous axial images were obtained from the base of the skull through the vertex without intravenous contrast. RADIATION DOSE REDUCTION: This exam was performed according to the departmental dose-optimization program which includes automated exposure control, adjustment of the mA and/or kV according to patient size and/or use of iterative reconstruction technique. COMPARISON:  Head CT 04/13/2023. FINDINGS:  Brain: No midline shift, ventriculomegaly, mass effect, evidence of mass lesion, intracranial hemorrhage or evidence of cortically based acute infarction. Stable cerebral volume. Cavum septum pellucidum, normal variant. Gray-white differentiation is stable, within normal limits for age. Vascular: Calcified atherosclerosis at the skull base. No suspicious intracranial vascular hyperdensity. Skull: Stable.  No skull fracture identified. Sinuses/Orbits: Ongoing mucosal thickening and fluid in the paranasal sinuses. Low-density fluid appears to be inflammatory in nature. Tympanic cavities and mastoids remain clear. Other: Right posterior convexity scalp soft tissue injury again noted. Less soft tissue gas. Orbits soft tissues remain negative. IMPRESSION: 1. Right posterior scalp soft tissue injury. No skull fracture identified. 2. Stable and negative noncontrast CT appearance of the brain. 3. Ongoing paranasal sinus inflammation, suspicious for acute sinusitis. Electronically Signed   By: Odessa Fleming M.D.   On: 04/16/2023 12:23    Assessment/Plan: Diagnosis: acute cervical spinal cord injury Does the need for close, 24 hr/day medical supervision in concert with the patient's rehab needs make it unreasonable for this patient to be served in a less intensive setting? Yes Co-Morbidities requiring supervision/potential  complications:  1) cervical stenosis: continue cervical collar 2) CKD stage 3: monitor BMP 3) Normocytic anemia: monitor Hgb 4) Pubic rami fractures 5) Hypoglycemia Due to bladder management, bowel management, safety, skin/wound care, disease management, medication administration, pain management, and patient education, does the patient require 24 hr/day rehab nursing? Yes Does the patient require coordinated care of a physician, rehab nurse, therapy disciplines of PT, OT to address physical and functional deficits in the context of the above medical diagnosis(es)? Yes Addressing deficits in the following areas: balance, endurance, locomotion, strength, transferring, bowel/bladder control, bathing, dressing, feeding, grooming, and psychosocial support Can the patient actively participate in an intensive therapy program of at least 3 hrs of therapy per day at least 5 days per week? Yes The potential for patient to make measurable gains while on inpatient rehab is excellent Anticipated functional outcomes upon discharge from inpatient rehab are supervision  with PT, supervision with OT, independent with SLP. Estimated rehab length of stay to reach the above functional goals is: 16-20 days Anticipated discharge destination: Home Overall Rehab/Functional Prognosis: excellent  POST ACUTE RECOMMENDATIONS: This patient's condition is appropriate for continued rehabilitative care in the following setting: CIR Patient has agreed to participate in recommended program. Yes Note that insurance prior authorization may be required for reimbursement for recommended care.    I have personally performed a face to face diagnostic evaluation of this patient. Additionally, I have examined the patient's medical record including any pertinent labs and radiographic images. If the physician assistant has documented in this note, I have reviewed and edited or otherwise concur with the physician assistant's  documentation.  Thanks,  Horton Chin, MD 04/17/2023

## 2023-04-17 NOTE — Plan of Care (Signed)
  Problem: Education: Goal: Ability to describe self-care measures that may prevent or decrease complications (Diabetes Survival Skills Education) will improve Outcome: Progressing Goal: Individualized Educational Video(s) Outcome: Progressing   Problem: Coping: Goal: Ability to adjust to condition or change in health will improve Outcome: Progressing   Problem: Fluid Volume: Goal: Ability to maintain a balanced intake and output will improve Outcome: Progressing   Problem: Health Behavior/Discharge Planning: Goal: Ability to identify and utilize available resources and services will improve Outcome: Progressing Goal: Ability to manage health-related needs will improve Outcome: Progressing   Problem: Metabolic: Goal: Ability to maintain appropriate glucose levels will improve Outcome: Progressing   Problem: Nutritional: Goal: Maintenance of adequate nutrition will improve Outcome: Progressing Goal: Progress toward achieving an optimal weight will improve Outcome: Progressing   Problem: Skin Integrity: Goal: Risk for impaired skin integrity will decrease Outcome: Progressing   Problem: Tissue Perfusion: Goal: Adequacy of tissue perfusion will improve Outcome: Progressing   Problem: Education: Goal: Knowledge of General Education information will improve Description: Including pain rating scale, medication(s)/side effects and non-pharmacologic comfort measures Outcome: Progressing   Problem: Health Behavior/Discharge Planning: Goal: Ability to manage health-related needs will improve Outcome: Progressing   Problem: Clinical Measurements: Goal: Ability to maintain clinical measurements within normal limits will improve Outcome: Progressing Goal: Will remain free from infection Outcome: Progressing Goal: Diagnostic test results will improve Outcome: Progressing Goal: Respiratory complications will improve Outcome: Progressing Goal: Cardiovascular complication will  be avoided Outcome: Progressing   Problem: Activity: Goal: Risk for activity intolerance will decrease Outcome: Progressing   Problem: Nutrition: Goal: Adequate nutrition will be maintained Outcome: Progressing   Problem: Elimination: Goal: Will not experience complications related to bowel motility Outcome: Progressing Goal: Will not experience complications related to urinary retention Outcome: Progressing   Problem: Pain Managment: Goal: General experience of comfort will improve Outcome: Progressing   Problem: Safety: Goal: Ability to remain free from injury will improve Outcome: Progressing   Problem: Skin Integrity: Goal: Risk for impaired skin integrity will decrease Outcome: Progressing   Problem: Activity: Goal: Ability to perform activities at highest level will improve Outcome: Progressing Goal: Muscle strength will improve Outcome: Progressing   Problem: Bowel/Gastric: Goal: Ability to demonstrate the techniques of an individualized bowel program will improve Outcome: Progressing   Problem: Education: Goal: Knowledge of disease or condition will improve Outcome: Progressing Goal: Knowledge of the prescribed therapeutic regimen will improve Outcome: Progressing   Problem: Coping: Goal: Ability to identify and develop effective coping behavior will improve Outcome: Progressing Goal: Ability to verbalize feelings will improve Outcome: Progressing   Problem: Self-Care: Goal: Ability to participate in self-care as condition permits will improve Outcome: Progressing   Problem: Skin Integrity: Goal: Risk for impaired skin integrity will decrease Outcome: Progressing   Problem: Urinary Elimination: Goal: Ability to achieve a regular elimination pattern will improve Outcome: Progressing

## 2023-04-17 NOTE — Plan of Care (Addendum)
Patient remains in [facility] at this time. Patient declines CHG bath overnight. Patient continues on Levophed via PIV; q 2 hour assessment of site. Art line to right femoral.  Edit: On-going constipation; PRN dulcolax and bisacodyl given this AM (see MAR).    Problem: Education: Goal: Ability to describe self-care measures that may prevent or decrease complications (Diabetes Survival Skills Education) will improve Outcome: Progressing Goal: Individualized Educational Video(s) Outcome: Progressing   Problem: Coping: Goal: Ability to adjust to condition or change in health will improve Outcome: Progressing   Problem: Fluid Volume: Goal: Ability to maintain a balanced intake and output will improve Outcome: Progressing   Problem: Health Behavior/Discharge Planning: Goal: Ability to identify and utilize available resources and services will improve Outcome: Progressing Goal: Ability to manage health-related needs will improve Outcome: Progressing   Problem: Metabolic: Goal: Ability to maintain appropriate glucose levels will improve Outcome: Progressing   Problem: Nutritional: Goal: Maintenance of adequate nutrition will improve Outcome: Progressing Goal: Progress toward achieving an optimal weight will improve Outcome: Progressing   Problem: Skin Integrity: Goal: Risk for impaired skin integrity will decrease Outcome: Progressing   Problem: Tissue Perfusion: Goal: Adequacy of tissue perfusion will improve Outcome: Progressing   Problem: Education: Goal: Knowledge of General Education information will improve Description: Including pain rating scale, medication(s)/side effects and non-pharmacologic comfort measures Outcome: Progressing   Problem: Health Behavior/Discharge Planning: Goal: Ability to manage health-related needs will improve Outcome: Progressing   Problem: Clinical Measurements: Goal: Ability to maintain clinical measurements within normal limits will  improve Outcome: Progressing Goal: Will remain free from infection Outcome: Progressing Goal: Diagnostic test results will improve Outcome: Progressing Goal: Respiratory complications will improve Outcome: Progressing Goal: Cardiovascular complication will be avoided Outcome: Progressing   Problem: Activity: Goal: Risk for activity intolerance will decrease Outcome: Progressing   Problem: Nutrition: Goal: Adequate nutrition will be maintained Outcome: Progressing   Problem: Coping: Goal: Level of anxiety will decrease Outcome: Progressing   Problem: Elimination: Goal: Will not experience complications related to bowel motility Outcome: Progressing Goal: Will not experience complications related to urinary retention Outcome: Progressing   Problem: Pain Managment: Goal: General experience of comfort will improve Outcome: Progressing   Problem: Safety: Goal: Ability to remain free from injury will improve Outcome: Progressing   Problem: Skin Integrity: Goal: Risk for impaired skin integrity will decrease Outcome: Progressing   Problem: Activity: Goal: Ability to perform activities at highest level will improve Outcome: Progressing Goal: Muscle strength will improve Outcome: Progressing   Problem: Bowel/Gastric: Goal: Ability to demonstrate the techniques of an individualized bowel program will improve Outcome: Progressing   Problem: Education: Goal: Knowledge of disease or condition will improve Outcome: Progressing Goal: Knowledge of the prescribed therapeutic regimen will improve Outcome: Progressing   Problem: Coping: Goal: Ability to identify and develop effective coping behavior will improve Outcome: Progressing Goal: Ability to verbalize feelings will improve Outcome: Progressing   Problem: Self-Care: Goal: Ability to participate in self-care as condition permits will improve Outcome: Progressing   Problem: Skin Integrity: Goal: Risk for impaired  skin integrity will decrease Outcome: Progressing   Problem: Urinary Elimination: Goal: Ability to achieve a regular elimination pattern will improve Outcome: Progressing

## 2023-04-17 NOTE — Progress Notes (Signed)
NAME:  [name], MRN:  [id number], DOB:  Feb 14, 1956, LOS: 4 ADMISSION DATE:  04/13/2023, CONSULTATION DATE:  04/13/2023 REFERRING MD:  Dr. Vicente Males, CHIEF COMPLAINT:  Fall   Brief Pt Description / Synopsis:  67 y.o. female with PMHx significant for cervical stenosis admitted with acute Cervical Spinal Cord Injury status post fall requiring peripheral Levophed for Blood Pressure Management and elevated MAP goals.  History of Present Illness:  67 yo female who presented to [facility] ER via EMS on 10/7 following a fall.  Per ER notes she was helping a friend and fell backwards.  She reports no memory of the fall but there is a question of if she loss consciousness.  EMS placed pt in a C-Collar.  Following the fall she developed a burning pain in her bilateral upper extremities which is worse on the right. She also had some burning pain in her feet.    ED Course  Upon arrival to the ER pt c/o having neck/head and right lower extremity pain.  She also endorsed RUE/RLE weakness when compared to the left side which is new.  Pt found to be hypoglycemic CBG 61, she received 1 amp of D50W followed by D10 infusion at 75 ml/hr.  Right hip x-ray revealed acute fractures of the right superior and inferior pubic rami.  Orthopedic surgery consulted by EDP and recommended weightbearing as tolerated.  MRI Cervical Spine concerning for cervical spinal cord injury.  Neurosurgeon Dr. Katrinka Blazing consulted by EDP.  He discussed the following options with pt and her family:  acute vs. delayed surgical management of the spinal cord injury. Following discussion pt and family decided to treat the spinal cord injury medically and have surgery when she is in a less acute stage of injury/illness.  PCCM team contacted for ICU admission for vasopressor to maintain map greater than 85 and placement of arterial line for closer bp monitoring.     Significant labs: chloride 115/CO2 19/BUN 32/creatinine 1.46/calcium 8.6/albumin 3.4/hgb 9.8    CT Head/Cervical Spine:  No acute intracranial abnormality. Soft tissue swelling along the right parietal scalp. No evidence of an underlying calvarial fracture. No acute fracture or traumatic malalignment of the cervical spine. Multilevel degenerative changes with calcified disc bulges and ligamentum flavum hypertrophy that results in moderate to severe spinal canal narrowing at C2-C3 and at least moderate spinal canal narrowing at C3-C4. If the patient has new onset myelopathic symptoms, further evaluation with a cervical spine MRI is recommended.   MR Cervical Spine: No acute traumatic finding by MRI. No fracture or evidence of ligamentous injury. Severe spinal stenosis at C2-3 and C3-4 due to endplate osteophytes, bulging of the disc and posterior ligamentous prominence. AP diameter of the canal in the midline as narrow as 4.5- 4.7 mm. Effacement of the subarachnoid space and deformity of the cord. Patient would be at risk of compressive myelopathy. Bilateral foraminal stenosis also present at these levels. Moderate spinal stenosis at C4-5 and C5-6 due to endplate osteophytes, bulging of the disc and posterior ligamentous prominence. Narrowing of the subarachnoid space with slight indentation of the cord at C4-5. Bilateral foraminal stenosis at C4-5.  Please see "Significant Hospital Events" section below for full detailed hospital course.  Pertinent  Medical History   Past Medical History:  Diagnosis Date   Arthritis    Blood dyscrasia    patient stated she is a "free bleeder"   Diabetes mellitus without complication (HCC)    Diverticulosis    Hypothyroidism  Neuropathy    Renal insufficiency     Micro Data:  10/7: MRSA PCR>>negative 10/8: HIV screen>>negative  Antimicrobials:   Anti-infectives (From admission, onward)    None       Significant Hospital Events: Including procedures, antibiotic start and stop dates in addition to other pertinent events   10/7: Pt admitted  to ICU with a cervical spinal cord injury following a fall treating medically for now per pt and pts family request with vasopressor to maintain map greater than 85 and for arterial line placement for closer bp monitoring  10/8 +spinal cord injury. Requiring low dose Levophed to maintain MAP >85. Foley placed due to urinary retention. 10/9: With continued weakness and pain of RUE/LLE. Remains on low dose Levophed (2 mcg), weaning as tolerated. 10/10: No significant events noted overnight.  Improvement in function and pain of RUE.  Remains on low dose peripheral Levophed to maintain MAP >85.  10/11: Per Neurosurgery plans for 5 days of elevated MAP goal (through Saturday 10/12).  Remains on low dose Levophed (2 mcg) to maintain MAP goal.  Working with PT.  Interim History / Subjective:  -No significant events noted overnight -Afebrile, hemodynamically stable, on room air -Is on 2 mcg Levophed to maintain MAP >85  -Slight improvement in RUE weakness and pain -Creatinine improved at 1.22 from 1.4 ~UOP 775 cc last 24 hrs (net - 1.1 L) -Slight Improvement in movement/strength and pain of RUE ~  RLE remains weak and with pain with motion -Resting this morning, slightly lethargic but arouses to voice and able to engage, oriented x4 -Will attempt foley removal and monitor for urinary retention  Objective   Blood pressure (!) 135/52, pulse 99, temperature 98.1 F (36.7 C), temperature source Oral, resp. rate 14, height 5\' 6"  (1.676 m), weight 60.6 kg, SpO2 95%.        Intake/Output Summary (Last 24 hours) at 04/17/2023 0801 Last data filed at 04/17/2023 0700 Gross per 24 hour  Intake 819.19 ml  Output 650 ml  Net 169.19 ml   Filed Weights   04/15/23 0414 04/16/23 0440 04/17/23 0355  Weight: 61.7 kg 64.7 kg 60.6 kg    Examination: General: Acute on chronically ill-appearing female, laying in bed, on room air, no acute distress HENT: Normocephalic, neck supple, no JVD Lungs: Clear breath  sounds throughout, even, nonlabored, normal effort Cardiovascular: Tachycardia, regular rhythm, S1-S2, no murmurs, rubs, gallops Abdomen: Soft, nontender, nondistended, no guarding or rebound tenderness, bowel sounds positive x 4 Extremities: Right upper and lower extremities weak (2-3 out of 5) with pain as compared to the left, positive sensation in all 4 extremities Neuro: Sleeping, arouses easily to voice, alert and oriented x 4, RUE strength and pain improved,right lower extremities weaker as compared to the left (2-3 out of 5) with pain, positive sensation in all 4 extremities GU: Foley catheter in place draining yellow urine  Resolved Hospital Problem list     Assessment & Plan:   #Acute Cervical Spinal Cord Injury s/p fall per neurosurgery due to previous significant cervical stenosis noted on MRI this likely puts her at high risk for a central cord injury with a flexion mechanism given the back of the head strike  PMHx: Cervical stenosis, Neuropathy -Continuous cardiac monitoring -Maintain MAP >85 -Vasopressors as needed to maintain MAP goal -Neurosurgery following, appreciate input ~ Follow recommendation as below: -Neuro checks q2h -Cervical collar when up and ambulating, does not require strict bedrest -PT/OT -Foley catheter for urinary retention -Continue outpatient  Gabapentin -Pain management  #CKD Stage III #Metabolic Acidosis -Monitor I&O's / urinary output -Follow BMP -Ensure adequate renal perfusion -Avoid nephrotoxic agents as able -Replace electrolytes as indicated ~ Pharmacy following for assistance with electrolyte replacement  #Normocytic Normochromic Anemia without s/sx of acute blood loss -Monitor for S/Sx of bleeding -Trend CBC -Heparin SQ for VTE Prophylaxis  -Transfuse for Hgb <7  #Acute Fracture of the right superior and inferior pubic rami -Orthopedics consulted, appreciate input ~ recommended weightbearing as tolerated -PT/OT -Pain  control  #Hypoglycemia #Hypothyroidism PMHx: Type II DM -CBG's q4h; Target range of 140 to 180 -SSI -Follow ICU Hypo/Hyperglycemia protocol -Hold outpatient glipizide, farxiga, metformin, and lantus for now -Continue outpatient Synthroid  #Altered Mental Status: suspect ICU Delirium vs sleep deprivation ~ IMPROVED -Treatment of metabolic derangements as outlined above -Provide supportive care -Promote normal sleep/wake cycle and family presence -Avoid sedating medications as able -CT Head negative for acute intracranial abnormality -ABG WNL     Best Practice (right click and "Reselect all SmartList Selections" daily)   Diet/type: Regular consistency (see orders) DVT prophylaxis: prophylactic heparin  GI prophylaxis: PPI Lines: Arterial line, and is still needed Foley:  Yes, and it is still needed Code Status:  full code Last date of multidisciplinary goals of care discussion [10/11]  10/11: Pt updated at bedside on plan of care.  Will update her husband when he arrives at bedside.  Labs   CBC: Recent Labs  Lab 04/13/23 1134 04/14/23 0358 04/15/23 0457 04/16/23 0444 04/17/23 0357  WBC 5.7 6.8 6.4 7.8 8.8  NEUTROABS 3.3  --   --   --   --   HGB 9.8* 9.2* 8.6* 8.8* 8.7*  HCT 29.2* 26.4* 24.8* 26.7* 25.9*  MCV 87.2 84.6 83.5 87.5 86.3  PLT 181 192 174 181 185    Basic Metabolic Panel: Recent Labs  Lab 04/13/23 1134 04/14/23 0358 04/15/23 0457 04/16/23 0444 04/17/23 0357  NA 140 138 137 139 141  K 4.2 3.6 4.3 4.0 4.5  CL 115* 112* 108 107 105  CO2 19* 22 20* 21* 18*  GLUCOSE 70 149* 130* 119* 106*  BUN 32* 29* 30* 32* 34*  CREATININE 1.46* 1.27* 1.24* 1.41* 1.22*  CALCIUM 8.6* 8.2* 8.7* 8.7* 8.8*  MG  --  2.1 1.9 2.1 2.0  PHOS  --  3.4 3.2 4.0 3.7   GFR: Estimated Creatinine Clearance: 43 mL/min (A) (by C-G formula based on SCr of 1.22 mg/dL (H)). Recent Labs  Lab 04/14/23 0358 04/15/23 0457 04/16/23 0444 04/17/23 0357  WBC 6.8 6.4 7.8 8.8     Liver Function Tests: Recent Labs  Lab 04/13/23 1134 04/16/23 0444 04/17/23 0357  AST 30  --   --   ALT 22  --   --   ALKPHOS 72  --   --   BILITOT 0.6  --   --   PROT 7.3  --   --   ALBUMIN 3.4* 3.0* 3.2*   No results for input(s): "LIPASE", "AMYLASE" in the last 168 hours. No results for input(s): "AMMONIA" in the last 168 hours.  ABG    Component Value Date/Time   PHART 7.38 04/16/2023 1022   PCO2ART 37 04/16/2023 1022   PO2ART 108 04/16/2023 1022   HCO3 21.9 04/16/2023 1022   ACIDBASEDEF 2.9 (H) 04/16/2023 1022   O2SAT 98.9 04/16/2023 1022     Coagulation Profile: No results for input(s): "INR", "PROTIME" in the last 168 hours.  Cardiac Enzymes: No results for input(s): "  CKTOTAL", "CKMB", "CKMBINDEX", "TROPONINI" in the last 168 hours.  HbA1C: Hemoglobin A1C  Date/Time Value Ref Range Status  02/20/2013 04:02 AM 10.3 (H) 4.2 - 6.3 % Final    Comment:    The American Diabetes Association recommends that a primary goal of therapy should be <7% and that physicians should reevaluate the treatment regimen in patients with HbA1c values consistently >8%.    Hgb A1c MFr Bld  Date/Time Value Ref Range Status  04/13/2023 11:34 AM 5.9 (H) 4.8 - 5.6 % Final    Comment:    (NOTE) Pre diabetes:          5.7%-6.4%  Diabetes:              >6.4%  Glycemic control for   <7.0% adults with diabetes   08/11/2021 08:39 PM 7.2 (H) 4.8 - 5.6 % Final    Comment:    (NOTE) Pre diabetes:          5.7%-6.4%  Diabetes:              >6.4%  Glycemic control for   <7.0% adults with diabetes     CBG: Recent Labs  Lab 04/16/23 1004 04/16/23 1235 04/16/23 1530 04/16/23 2219 04/17/23 0758  GLUCAP 105* 130* 122* 98 94    Review of Systems:   Positives in BOLD: Gen: Denies fever, chills, weight change, fatigue, night sweats HEENT: Denies blurred vision, double vision, hearing loss, tinnitus, sinus congestion, rhinorrhea, sore throat, neck stiffness,  dysphagia PULM: Denies shortness of breath, cough, sputum production, hemoptysis, wheezing CV: Denies chest pain, edema, orthopnea, paroxysmal nocturnal dyspnea, palpitations GI: Denies abdominal pain, nausea, vomiting, diarrhea, hematochezia, melena, constipation, change in bowel habits GU: Denies dysuria, hematuria, polyuria, oliguria, urethral discharge Endocrine: Denies hot or cold intolerance, polyuria, polyphagia or appetite change Derm: Denies rash, dry skin, scaling or peeling skin change Heme: Denies easy bruising, bleeding, bleeding gums Neuro: Denies headache, numbness, right sided weakness (RUE & RLE), slurred speech, loss of memory or consciousness   Past Medical History:  She,  has a past medical history of Arthritis, Blood dyscrasia, Diabetes mellitus without complication (HCC), Diverticulosis, Hypothyroidism, Neuropathy, and Renal insufficiency.   Surgical History:   Past Surgical History:  Procedure Laterality Date   ABDOMINAL HYSTERECTOMY     BILATERAL SALPINGECTOMY Bilateral 03/16/2017   Procedure: BILATERAL SALPINGECTOMY;  Surgeon: Schermerhorn, Ihor Austin, MD;  Location: [facility] ORS;  Service: Gynecology;  Laterality: Bilateral;   COLONOSCOPY WITH PROPOFOL N/A 08/18/2018   Procedure: COLONOSCOPY WITH PROPOFOL;  Surgeon: Scot Jun, MD;  Location: [facility] ENDOSCOPY;  Service: Endoscopy;  Laterality: N/A;   CYSTOCELE REPAIR N/A 03/16/2017   Procedure: ANTERIOR REPAIR (CYSTOCELE);  Surgeon: Schermerhorn, Ihor Austin, MD;  Location: [facility] ORS;  Service: Gynecology;  Laterality: N/A;   ESOPHAGOGASTRODUODENOSCOPY (EGD) WITH PROPOFOL N/A 08/18/2018   Procedure: ESOPHAGOGASTRODUODENOSCOPY (EGD) WITH PROPOFOL;  Surgeon: Scot Jun, MD;  Location: [facility] ENDOSCOPY;  Service: Endoscopy;  Laterality: N/A;   TEAR DUCT PROBING Right    TUBAL LIGATION     VAGINAL HYSTERECTOMY N/A 03/16/2017   Procedure: HYSTERECTOMY VAGINAL;  Surgeon: Schermerhorn, Ihor Austin, MD;  Location: [facility] ORS;   Service: Gynecology;  Laterality: N/A;   WISDOM TOOTH EXTRACTION       Social History:   reports that she has never smoked. She has never used smokeless tobacco. She reports that she does not drink alcohol and does not use drugs.   Family History:  Her family history includes Breast cancer in  her maternal aunt; Diabetes in her father; Hypertension in her father and mother.   Allergies Allergies  Allergen Reactions   Augmentin [Amoxicillin-Pot Clavulanate] Itching   Ampicillin Rash     Home Medications  Prior to Admission medications   Medication Sig Start Date End Date Taking? Authorizing Provider  calcium carbonate (OS-CAL - DOSED IN MG OF ELEMENTAL CALCIUM) 1250 (500 Ca) MG tablet Take 1 tablet by mouth daily with breakfast.   Yes [provider]  cholecalciferol (VITAMIN D3) 25 MCG (1000 UNIT) tablet Take 1,000 Units by mouth daily.   Yes [provider]  ertugliflozin L-PyroglutamicAc (STEGLATRO) 15 MG TABS tablet Take 15 mg by mouth daily.   Yes [provider]  gabapentin (NEURONTIN) 100 MG capsule Take 200 mg by mouth 2 (two) times daily.  08/16/15  Yes [provider]  glipiZIDE (GLUCOTROL) 5 MG tablet Take 5 mg by mouth daily before breakfast.   Yes [provider]  lactase (LACTAID) 3000 units tablet Take 3,000 Units by mouth daily before breakfast.   Yes [provider]  levothyroxine (SYNTHROID) 100 MCG tablet Take 100 mcg by mouth daily before breakfast.   Yes [provider]  omeprazole (PRILOSEC) 40 MG capsule Take 1 capsule (40 mg total) by mouth 2 (two) times daily. Take 30 min after Synthroid and 30 min before breakfast 08/21/22 04/14/23 Yes Pilar Jarvis, MD  oxybutynin (DITROPAN-XL) 5 MG 24 hr tablet Take 5 mg by mouth at bedtime.   Yes [provider]  psyllium (REGULOID) 0.52 g capsule Take 0.52 g by mouth daily.   Yes [provider]  rosuvastatin (CRESTOR) 5 MG tablet Take 5 mg by mouth  daily.   Yes [provider]  vitamin B-12 (CYANOCOBALAMIN) 100 MCG tablet Take 100 mcg by mouth daily.   Yes [provider]  insulin glargine (LANTUS) 100 UNIT/ML Solostar Pen Inject 14 Units into the skin at bedtime. Patient not taking: Reported on 04/14/2023    [provider]  metoCLOPramide (REGLAN) 10 MG tablet Take 1 tablet (10 mg total) by mouth every 8 (eight) hours as needed for nausea or vomiting. Patient not taking: Reported on 04/14/2023 08/21/22 08/21/23  Dionne Bucy, MD  ondansetron (ZOFRAN-ODT) 4 MG disintegrating tablet Take 1 tablet (4 mg total) by mouth every 6 (six) hours as needed for nausea or vomiting. 03/21/22   Ward, Layla Maw, DO  oxyCODONE-acetaminophen (PERCOCET/ROXICET) 5-325 MG tablet Take 2 tablets by mouth every 6 (six) hours as needed. Patient not taking: Reported on 04/14/2023 03/21/22   Ward, Layla Maw, DO     Critical care time: 40 minutes     Harlon Ditty, AGACNP-BC [facility] Prefer epic messenger for cross cover needs If after hours, please call E-link

## 2023-04-17 NOTE — Progress Notes (Signed)
Occupational Therapy Treatment Patient Details Name: [name] MRN: [id number] DOB: 04-26-1956 Today's Date: 04/17/2023   History of present illness Pt is a 67 year old female presents with the chief complaint of cervical spinal cord injury after a fall, admitted with cervical spinal cord injury central vs hemicord pattern; Acute Fracture of the right superior and inferior pubic rami- WBAT    PMH significant for Arthritis, Blood dyscrasia, Diabetes mellitus without complication (HCC), Diverticulosis, Hypothyroidism, Neuropathy, and Renal insufficiency.   OT comments  Chart reviewed to date, pt greeted in bed, agreeable to OT tx session. Co tx completed with PT on this date. Pt continues to require increased time for one step directions, opening eyes for participation. Improved participation noted with positional changes. Improvements noted in static sitting balance with pt seated on edge of bed for approx 5 minutes for grooming tasks, performing face washing/oral care with MIN A. STS with MIN-MOD A +2. Lateral steps up the bed with MOD A +2, limited by reported R hip pain. Noted BP drop under goal with STS. Nurse in room to address. Pt is making progress towards goals, discharge recommendation remains appropriate. OT will follow acutely.       If plan is discharge home, recommend the following:  A lot of help with walking and/or transfers;A lot of help with bathing/dressing/bathroom;Assistance with feeding;Direct supervision/assist for financial management;Help with stairs or ramp for entrance;Direct supervision/assist for medications management   Equipment Recommendations  Other (comment) (per next venue of care)    Recommendations for Other Services      Precautions / Restrictions Precautions Precautions: Cervical Precaution Comments: watch bp Required Braces or Orthoses: Cervical Brace Cervical Brace: Hard collar Restrictions Weight Bearing Restrictions: No Other Position/Activity  Restrictions: per orders, ok to donn collar edge of bed, amb to bathroom/shower without c collar       Mobility Bed Mobility Overal bed mobility: Needs Assistance Bed Mobility: Sit to Supine, Supine to Sit     Supine to sit: Used rails, Mod assist Sit to supine: Max assist        Transfers Overall transfer level: Needs assistance Equipment used: None Transfers: Sit to/from Stand Sit to Stand: Min assist, Mod assist, +2 safety/equipment, stood for approx 30 seconds 1 attempt            General transfer comment: two attempts, MOD A +2 for lateral steps up the bed, limited by pain     Balance Overall balance assessment: Needs assistance Sitting-balance support: Bilateral upper extremity supported, Feet supported Sitting balance-Leahy Scale: Good Sitting on edge of bed for approx 5 minutes for ADL tasks      Standing balance support: Bilateral upper extremity supported, During functional activity, Reliant on assistive device for balance Standing balance-Leahy Scale: Poor                             ADL either performed or assessed with clinical judgement   ADL Overall ADL's : Needs assistance/impaired     Grooming: Minimal assistance;Sitting;Wash/dry face;Oral care Grooming Details (indicate cue type and reason): MAX A to brush hair at edge of bed; pt able to bring R hand to mouth/forehad, increased time and frequent vcs for attention to task         Upper Body Dressing : Maximal assistance Upper Body Dressing Details (indicate cue type and reason): collar Lower Body Dressing: Maximal assistance  Extremity/Trunk Assessment Upper Extremity Assessment RUE Deficits / Details: continued noted B grip strength impairments            Vision Patient Visual Report: No change from baseline     Perception     Praxis      Cognition Arousal: Lethargic Behavior During Therapy: Flat affect Overall Cognitive Status:  Impaired/Different from baseline Area of Impairment: Attention, Following commands, Awareness, Problem solving                   Current Attention Level: Focused   Following Commands: Follows one step commands with increased time   Awareness: Emergent Problem Solving: Requires verbal cues, Requires tactile cues, Slow processing          Exercises Other Exercises Other Exercises: edu pt/husband/son re improtance of mobility, pt re: improtance of nutritional intake and being awake for intake to decrease risk of aspiration, assisted with lunch order Other Exercises: discussed with nurse re: use of ted hose/trial chair position, left built up foam for use at meal times as approrpiate    Shoulder Instructions       General Comments Nurse cleared pt for participation in OT tx session. a line/foley/IV/leads intact pre/post session; BP with MAP appearing below goal in standing on a line with nurse aware and titrated levophed    Pertinent Vitals/ Pain       Pain Assessment Pain Assessment: 0-10 Pain Score: 10-Worst pain ever Pain Location: R Hip, R arm Pain Descriptors / Indicators: Aching, Discomfort, Moaning, Grimacing Pain Intervention(s): Limited activity within patient's tolerance, Monitored during session, Repositioned, Relaxation  Home Living                                          Prior Functioning/Environment              Frequency  Min 1X/week        Progress Toward Goals  OT Goals(current goals can now be found in the care plan section)  Progress towards OT goals: Progressing toward goals     Plan      Co-evaluation    PT/OT/SLP Co-Evaluation/Treatment: Yes Reason for Co-Treatment: Complexity of the patient's impairments (multi-system involvement);Necessary to address cognition/behavior during functional activity;For patient/therapist safety   OT goals addressed during session: ADL's and self-care;Proper use of Adaptive  equipment and DME      AM-PAC OT "6 Clicks" Daily Activity     Outcome Measure   Help from another person eating meals?: A Lot Help from another person taking care of personal grooming?: A Lot Help from another person toileting, which includes using toliet, bedpan, or urinal?: A Lot Help from another person bathing (including washing, rinsing, drying)?: A Lot Help from another person to put on and taking off regular upper body clothing?: A Lot Help from another person to put on and taking off regular lower body clothing?: A Lot 6 Click Score: 12    End of Session    OT Visit Diagnosis: Muscle weakness (generalized) (M62.81);Other abnormalities of gait and mobility (R26.89)   Activity Tolerance Patient tolerated treatment well   Patient Left in bed;with call bell/phone within reach;with family/visitor present   Nurse Communication Mobility status        Time: 2130-8657 OT Time Calculation (min): 31 min  Charges: OT General Charges $OT Visit: 1 Visit OT Treatments $Self Care/Home Management : 8-22 mins  Oleta Mouse, OTD OTR/L  04/17/23, 11:02 AM

## 2023-04-18 DIAGNOSIS — G9520 Unspecified cord compression: Secondary | ICD-10-CM | POA: Diagnosis not present

## 2023-04-18 LAB — GLUCOSE, CAPILLARY
Glucose-Capillary: 174 mg/dL — ABNORMAL HIGH (ref 70–99)
Glucose-Capillary: 146 mg/dL — ABNORMAL HIGH (ref 70–99)
Glucose-Capillary: 123 mg/dL — ABNORMAL HIGH (ref 70–99)
Glucose-Capillary: 120 mg/dL — ABNORMAL HIGH (ref 70–99)

## 2023-04-18 LAB — RENAL FUNCTION PANEL
Albumin: 3.1 g/dL — ABNORMAL LOW (ref 3.5–5.0)
Anion gap: 11 (ref 5–15)
BUN: 33 mg/dL — ABNORMAL HIGH (ref 8–23)
CO2: 23 mmol/L (ref 22–32)
Calcium: 8.6 mg/dL — ABNORMAL LOW (ref 8.9–10.3)
Chloride: 106 mmol/L (ref 98–111)
Creatinine, Ser: 1.23 mg/dL — ABNORMAL HIGH (ref 0.44–1.00)
GFR, Estimated: 49 mL/min — ABNORMAL LOW (ref >60)
Glucose, Bld: 137 mg/dL — ABNORMAL HIGH (ref 70–99)
Phosphorus: 2.9 mg/dL (ref 2.5–4.6)
Potassium: 4.3 mmol/L (ref 3.5–5.1)
Sodium: 140 mmol/L (ref 135–145)

## 2023-04-18 LAB — CBC
HCT: 27.4 % — ABNORMAL LOW (ref 36.0–46.0)
Hemoglobin: 9 g/dL — ABNORMAL LOW (ref 12.0–15.0)
MCH: 28.7 pg (ref 26.0–34.0)
MCHC: 32.8 g/dL (ref 30.0–36.0)
MCV: 87.3 fL (ref 80.0–100.0)
Platelets: 219 10*3/uL (ref 150–400)
RBC: 3.14 MIL/uL — ABNORMAL LOW (ref 3.87–5.11)
RDW: 14.2 % (ref 11.5–15.5)
WBC: 8.9 10*3/uL (ref 4.0–10.5)
nRBC: 0 % (ref 0.0–0.2)

## 2023-04-18 NOTE — Progress Notes (Signed)
NAME:  [name], MRN:  [id number], DOB:  21-Oct-1955, LOS: 5 ADMISSION DATE:  04/13/2023, CONSULTATION DATE:  04/13/2023 REFERRING MD:  Dr. Vicente Males, CHIEF COMPLAINT:  Fall   Brief Pt Description / Synopsis:  67 y.o. female with PMHx significant for cervical stenosis admitted with acute Cervical Spinal Cord Injury status post fall requiring peripheral Levophed for Blood Pressure Management and elevated MAP goals.  History of Present Illness:  67 yo female who presented to [facility] ER via EMS on 10/7 following a fall.  Per ER notes she was helping a friend and fell backwards.  She reports no memory of the fall but there is a question of if she loss consciousness.  EMS placed pt in a C-Collar.  Following the fall she developed a burning pain in her bilateral upper extremities which is worse on the right. She also had some burning pain in her feet.    ED Course  Upon arrival to the ER pt c/o having neck/head and right lower extremity pain.  She also endorsed RUE/RLE weakness when compared to the left side which is new.  Pt found to be hypoglycemic CBG 61, she received 1 amp of D50W followed by D10 infusion at 75 ml/hr.  Right hip x-ray revealed acute fractures of the right superior and inferior pubic rami.  Orthopedic surgery consulted by EDP and recommended weightbearing as tolerated.  MRI Cervical Spine concerning for cervical spinal cord injury.  Neurosurgeon Dr. Katrinka Blazing consulted by EDP.  He discussed the following options with pt and her family:  acute vs. delayed surgical management of the spinal cord injury. Following discussion pt and family decided to treat the spinal cord injury medically and have surgery when she is in a less acute stage of injury/illness.  PCCM team contacted for ICU admission for vasopressor to maintain map greater than 85 and placement of arterial line for closer bp monitoring.     Significant labs: chloride 115/CO2 19/BUN 32/creatinine 1.46/calcium 8.6/albumin 3.4/hgb 9.8    CT Head/Cervical Spine:  No acute intracranial abnormality. Soft tissue swelling along the right parietal scalp. No evidence of an underlying calvarial fracture. No acute fracture or traumatic malalignment of the cervical spine. Multilevel degenerative changes with calcified disc bulges and ligamentum flavum hypertrophy that results in moderate to severe spinal canal narrowing at C2-C3 and at least moderate spinal canal narrowing at C3-C4. If the patient has new onset myelopathic symptoms, further evaluation with a cervical spine MRI is recommended.   MR Cervical Spine: No acute traumatic finding by MRI. No fracture or evidence of ligamentous injury. Severe spinal stenosis at C2-3 and C3-4 due to endplate osteophytes, bulging of the disc and posterior ligamentous prominence. AP diameter of the canal in the midline as narrow as 4.5- 4.7 mm. Effacement of the subarachnoid space and deformity of the cord. Patient would be at risk of compressive myelopathy. Bilateral foraminal stenosis also present at these levels. Moderate spinal stenosis at C4-5 and C5-6 due to endplate osteophytes, bulging of the disc and posterior ligamentous prominence. Narrowing of the subarachnoid space with slight indentation of the cord at C4-5. Bilateral foraminal stenosis at C4-5.  Please see "Significant Hospital Events" section below for full detailed hospital course.  Pertinent  Medical History   Past Medical History:  Diagnosis Date   Arthritis    Blood dyscrasia    patient stated she is a "free bleeder"   Diabetes mellitus without complication (HCC)    Diverticulosis    Hypothyroidism  Neuropathy    Renal insufficiency     Micro Data:  10/7: MRSA PCR>>negative 10/8: HIV screen>>negative  Antimicrobials:   Anti-infectives (From admission, onward)    None       Significant Hospital Events: Including procedures, antibiotic start and stop dates in addition to other pertinent events   10/7: Pt admitted  to ICU with a cervical spinal cord injury following a fall treating medically for now per pt and pts family request with vasopressor to maintain map greater than 85 and for arterial line placement for closer bp monitoring  10/8 +spinal cord injury. Requiring low dose Levophed to maintain MAP >85. Foley placed due to urinary retention. 10/9: With continued weakness and pain of RUE/LLE. Remains on low dose Levophed (2 mcg), weaning as tolerated. 10/10: No significant events noted overnight.  Improvement in function and pain of RUE.  Remains on low dose peripheral Levophed to maintain MAP >85.  10/11: Per Neurosurgery plans for 5 days of elevated MAP goal (through Saturday 10/12).  Remains on low dose Levophed (2 mcg) to maintain MAP goal.  Working with PT.  Interim History / Subjective:  Remains on pressors MAP goal 85 No significant events OV  -No significant events noted overnight -Afebrile, hemodynamically stable, on room air -Is on 2 mcg Levophed to maintain MAP >85  -improvement in RUE weakness and pain -Slight Improvement in movement/strength and pain of RUE ~  RLE remains weak and with pain with motion -Resting this morning, slightly lethargic but arouses to voice and able to engage, oriented x4   Objective   Blood pressure 139/66, pulse 93, temperature 98 F (36.7 C), temperature source Oral, resp. rate 12, height 5\' 6"  (1.676 m), weight 60.1 kg, SpO2 94%.        Intake/Output Summary (Last 24 hours) at 04/18/2023 0733 Last data filed at 04/18/2023 0700 Gross per 24 hour  Intake 683.63 ml  Output 511 ml  Net 172.63 ml   Filed Weights   04/16/23 0440 04/17/23 0355 04/18/23 0500  Weight: 64.7 kg 60.6 kg 60.1 kg      Review of Systems: Gen:  Denies  fever, sweats, chills weight loss  HEENT: Denies blurred vision, double vision, ear pain, eye pain, hearing loss, nose bleeds, sore throat Cardiac:  No dizziness, chest pain or heaviness, chest tightness,edema, No JVD Resp:    No cough, -sputum production, -shortness of breath,-wheezing, -hemoptysis,  Other:  All other systems negative   Physical Examination:   General Appearance: No distress  EYES PERRLA, EOM intact.   NECK Supple, No JVD Pulmonary: normal breath sounds, No wheezing.  CardiovascularNormal S1,S2.  No m/r/g.   Abdomen: Benign, Soft, non-tender. Neurology Sleeping, arouses easily to voice, alert and oriented x 4, RUE strength and pain improved,right lower extremities weaker as compared to the left (2-3 out of 5) with pain, positive sensation in all 4 extremities Ext pulses intact, cap refill intact ALL OTHER ROS ARE NEGATIVE    Assessment & Plan:   Admitted for Acute Cervical Spinal Cord Injury s/p fall per neurosurgery due to previous significant cervical stenosis noted on MRI this likely puts her at high risk for a central cord injury with a flexion mechanism given the back of the head strike  PMHx: Cervical stenosis, Neuropathy -Continuous cardiac monitoring -Maintain MAP >85 today -Vasopressors as needed to maintain MAP goal -Neurosurgery following, appreciate input ~ Follow recommendation as below: -Neuro checks q2h -Cervical collar when up and ambulating, does not require strict bedrest -PT/OT -  Foley catheter for urinary retention -Continue outpatient Gabapentin -Pain management  Acute Fracture of the right superior and inferior pubic rami -Orthopedics consulted, appreciate input ~ recommended weightbearing as tolerated -PT/OT -Pain control   ACUTE ANEMIA- TRANSFUSE AS NEEDED CONSIDER TRANSFUSION  IF HGB<7    ENDO - ICU hypoglycemic\Hyperglycemia protocol -check FSBS per protocol   GI GI PROPHYLAXIS as indicated  NUTRITIONAL STATUS DIET--> as tolerated Constipation protocol as indicated   ELECTROLYTES -follow labs as needed -replace as needed -pharmacy consultation and following    Best Practice (right click and "Reselect all SmartList Selections"  daily)   Diet/type: Regular consistency (see orders) DVT prophylaxis: prophylactic heparin  GI prophylaxis: PPI Lines: Arterial line, and is still needed Foley:  Yes, and it is still needed Code Status:  full code Last date of multidisciplinary goals of care discussion [10/11]  10/11: Pt updated at bedside on plan of care.  Will update her husband when he arrives at bedside.  Labs   CBC: Recent Labs  Lab 04/13/23 1134 04/14/23 0358 04/15/23 0457 04/16/23 0444 04/17/23 0357 04/18/23 0421  WBC 5.7 6.8 6.4 7.8 8.8 8.9  NEUTROABS 3.3  --   --   --   --   --   HGB 9.8* 9.2* 8.6* 8.8* 8.7* 9.0*  HCT 29.2* 26.4* 24.8* 26.7* 25.9* 27.4*  MCV 87.2 84.6 83.5 87.5 86.3 87.3  PLT 181 192 174 181 185 219    Basic Metabolic Panel: Recent Labs  Lab 04/14/23 0358 04/15/23 0457 04/16/23 0444 04/17/23 0357 04/18/23 0421  NA 138 137 139 141 140  K 3.6 4.3 4.0 4.5 4.3  CL 112* 108 107 105 106  CO2 22 20* 21* 18* 23  GLUCOSE 149* 130* 119* 106* 137*  BUN 29* 30* 32* 34* 33*  CREATININE 1.27* 1.24* 1.41* 1.22* 1.23*  CALCIUM 8.2* 8.7* 8.7* 8.8* 8.6*  MG 2.1 1.9 2.1 2.0  --   PHOS 3.4 3.2 4.0 3.7 2.9   GFR: Estimated Creatinine Clearance: 42.7 mL/min (A) (by C-G formula based on SCr of 1.23 mg/dL (H)). Recent Labs  Lab 04/15/23 0457 04/16/23 0444 04/17/23 0357 04/18/23 0421  WBC 6.4 7.8 8.8 8.9    Liver Function Tests: Recent Labs  Lab 04/13/23 1134 04/16/23 0444 04/17/23 0357 04/18/23 0421  AST 30  --   --   --   ALT 22  --   --   --   ALKPHOS 72  --   --   --   BILITOT 0.6  --   --   --   PROT 7.3  --   --   --   ALBUMIN 3.4* 3.0* 3.2* 3.1*   No results for input(s): "LIPASE", "AMYLASE" in the last 168 hours. No results for input(s): "AMMONIA" in the last 168 hours.  ABG    Component Value Date/Time   PHART 7.38 04/16/2023 1022   PCO2ART 37 04/16/2023 1022   PO2ART 108 04/16/2023 1022   HCO3 21.9 04/16/2023 1022   ACIDBASEDEF 2.9 (H) 04/16/2023 1022    O2SAT 98.9 04/16/2023 1022     Coagulation Profile: No results for input(s): "INR", "PROTIME" in the last 168 hours.  Cardiac Enzymes: No results for input(s): "CKTOTAL", "CKMB", "CKMBINDEX", "TROPONINI" in the last 168 hours.  HbA1C: Hemoglobin A1C  Date/Time Value Ref Range Status  02/20/2013 04:02 AM 10.3 (H) 4.2 - 6.3 % Final    Comment:    The American Diabetes Association recommends that a primary goal of therapy should  be <7% and that physicians should reevaluate the treatment regimen in patients with HbA1c values consistently >8%.    Hgb A1c MFr Bld  Date/Time Value Ref Range Status  04/13/2023 11:34 AM 5.9 (H) 4.8 - 5.6 % Final    Comment:    (NOTE) Pre diabetes:          5.7%-6.4%  Diabetes:              >6.4%  Glycemic control for   <7.0% adults with diabetes   08/11/2021 08:39 PM 7.2 (H) 4.8 - 5.6 % Final    Comment:    (NOTE) Pre diabetes:          5.7%-6.4%  Diabetes:              >6.4%  Glycemic control for   <7.0% adults with diabetes     CBG: Recent Labs  Lab 04/16/23 2219 04/17/23 0758 04/17/23 1238 04/17/23 1549 04/17/23 2116  GLUCAP 98 94 130* 187* 123*    Past Medical History:  She,  has a past medical history of Arthritis, Blood dyscrasia, Diabetes mellitus without complication (HCC), Diverticulosis, Hypothyroidism, Neuropathy, and Renal insufficiency.   Surgical History:   Past Surgical History:  Procedure Laterality Date   ABDOMINAL HYSTERECTOMY     BILATERAL SALPINGECTOMY Bilateral 03/16/2017   Procedure: BILATERAL SALPINGECTOMY;  Surgeon: Schermerhorn, Ihor Austin, MD;  Location: [facility] ORS;  Service: Gynecology;  Laterality: Bilateral;   COLONOSCOPY WITH PROPOFOL N/A 08/18/2018   Procedure: COLONOSCOPY WITH PROPOFOL;  Surgeon: Scot Jun, MD;  Location: [facility] ENDOSCOPY;  Service: Endoscopy;  Laterality: N/A;   CYSTOCELE REPAIR N/A 03/16/2017   Procedure: ANTERIOR REPAIR (CYSTOCELE);  Surgeon: Schermerhorn, Ihor Austin, MD;   Location: [facility] ORS;  Service: Gynecology;  Laterality: N/A;   ESOPHAGOGASTRODUODENOSCOPY (EGD) WITH PROPOFOL N/A 08/18/2018   Procedure: ESOPHAGOGASTRODUODENOSCOPY (EGD) WITH PROPOFOL;  Surgeon: Scot Jun, MD;  Location: [facility] ENDOSCOPY;  Service: Endoscopy;  Laterality: N/A;   TEAR DUCT PROBING Right    TUBAL LIGATION     VAGINAL HYSTERECTOMY N/A 03/16/2017   Procedure: HYSTERECTOMY VAGINAL;  Surgeon: Schermerhorn, Ihor Austin, MD;  Location: [facility] ORS;  Service: Gynecology;  Laterality: N/A;   WISDOM TOOTH EXTRACTION       Social History:   reports that she has never smoked. She has never used smokeless tobacco. She reports that she does not drink alcohol and does not use drugs.   Family History:  Her family history includes Breast cancer in her maternal aunt; Diabetes in her father; Hypertension in her father and mother.   Allergies Allergies  Allergen Reactions   Augmentin [Amoxicillin-Pot Clavulanate] Itching   Ampicillin Rash     Home Medications  Prior to Admission medications   Medication Sig Start Date End Date Taking? Authorizing Provider  calcium carbonate (OS-CAL - DOSED IN MG OF ELEMENTAL CALCIUM) 1250 (500 Ca) MG tablet Take 1 tablet by mouth daily with breakfast.   Yes [provider]  cholecalciferol (VITAMIN D3) 25 MCG (1000 UNIT) tablet Take 1,000 Units by mouth daily.   Yes [provider]  ertugliflozin L-PyroglutamicAc (STEGLATRO) 15 MG TABS tablet Take 15 mg by mouth daily.   Yes [provider]  gabapentin (NEURONTIN) 100 MG capsule Take 200 mg by mouth 2 (two) times daily.  08/16/15  Yes [provider]  glipiZIDE (GLUCOTROL) 5 MG tablet Take 5 mg by mouth daily before breakfast.   Yes [provider]  lactase (LACTAID) 3000 units tablet Take 3,000  Units by mouth daily before breakfast.   Yes [provider]  levothyroxine (SYNTHROID) 100 MCG tablet Take 100 mcg by mouth daily before breakfast.   Yes  [provider]  omeprazole (PRILOSEC) 40 MG capsule Take 1 capsule (40 mg total) by mouth 2 (two) times daily. Take 30 min after Synthroid and 30 min before breakfast 08/21/22 04/14/23 Yes Pilar Jarvis, MD  oxybutynin (DITROPAN-XL) 5 MG 24 hr tablet Take 5 mg by mouth at bedtime.   Yes [provider]  psyllium (REGULOID) 0.52 g capsule Take 0.52 g by mouth daily.   Yes [provider]  rosuvastatin (CRESTOR) 5 MG tablet Take 5 mg by mouth daily.   Yes [provider]  vitamin B-12 (CYANOCOBALAMIN) 100 MCG tablet Take 100 mcg by mouth daily.   Yes [provider]  insulin glargine (LANTUS) 100 UNIT/ML Solostar Pen Inject 14 Units into the skin at bedtime. Patient not taking: Reported on 04/14/2023    [provider]  metoCLOPramide (REGLAN) 10 MG tablet Take 1 tablet (10 mg total) by mouth every 8 (eight) hours as needed for nausea or vomiting. Patient not taking: Reported on 04/14/2023 08/21/22 08/21/23  Dionne Bucy, MD  ondansetron (ZOFRAN-ODT) 4 MG disintegrating tablet Take 1 tablet (4 mg total) by mouth every 6 (six) hours as needed for nausea or vomiting. 03/21/22   Ward, Layla Maw, DO  oxyCODONE-acetaminophen (PERCOCET/ROXICET) 5-325 MG tablet Take 2 tablets by mouth every 6 (six) hours as needed. Patient not taking: Reported on 04/14/2023 03/21/22   Ward, Layla Maw, DO       DVT/GI PRX  assessed I Assessed the need for Labs I Assessed the need for Foley I Assessed the need for Central Venous Line Family Discussion when available I Assessed the need for Mobilization I made an Assessment of medications to be adjusted accordingly Safety Risk assessment completed  CASE DISCUSSED IN MULTIDISCIPLINARY ROUNDS WITH ICU TEAM     Critical Care Time devoted to patient care services described in this note is 45 minutes.  Critical care was necessary to treat /prevent imminent and life-threatening deterioration.   Lucie Leather,  M.D.  [facility]  Medical Director [facility] Medical Director [facility] Cardio-Pulmonary Department

## 2023-04-18 NOTE — Progress Notes (Signed)
Physical Therapy Treatment Patient Details Name: [name] MRN: [id number] DOB: 23-Dec-1955 Today's Date: 04/18/2023   History of Present Illness Pt is a 67 year old female presents with the chief complaint of cervical spinal cord injury after a fall, admitted with cervical spinal cord injury central vs hemicord pattern; Acute Fracture of the right superior and inferior pubic rami- WBAT    PMH significant for Arthritis, Blood dyscrasia, Diabetes mellitus without complication (HCC), Diverticulosis, Hypothyroidism, Neuropathy, and Renal insufficiency.    PT Comments  Pt was side lying in bed upon arrival. She is awake but very lethargic. Remains lethargic throughout session but does participate in OOB activity. Pt required increased time to perform all desired task. She did state correct day, setting, and year however author questions if she truly understands current situation. A-line was removed ~ 1hr 45 minutes prior to session. Pt did stand and tolerate taking 4-5 steps along EOB. Overall lethargy limits session more so than strength, balance or overall mobility. Acute PT will continue to follow per current POC. DC recs remain appropriate.    If plan is discharge home, recommend the following: A lot of help with walking and/or transfers;A lot of help with bathing/dressing/bathroom;Assist for transportation;Help with stairs or ramp for entrance     Equipment Recommendations  Rolling walker (2 wheels)       Precautions / Restrictions Precautions Precautions: Cervical Precaution Booklet Issued: No Precaution Comments: A line removed ~ 2hours prior to session Cervical Brace: Hard collar (ridgid [place] brace) Restrictions Weight Bearing Restrictions: No     Mobility  Bed Mobility Overal bed mobility: Needs Assistance Bed Mobility: Sidelying to Sit, Sit to Supine, Sit to Sidelying  Supine to sit: Used rails, Mod assist Sit to supine: Max assist     Transfers Overall transfer  level: Needs assistance Equipment used: Rolling walker (2 wheels) Transfers: Sit to/from Stand Sit to Stand: Mod assist  General transfer comment: pt stood 3 x total at EOB. was able to take steps along EOB slowly. increased time to perform all desired task due to lethargy    Ambulation/Gait  General Gait Details: pt took ~ 4-5 steps along EOB with author progressing RW sideways for pt. increased time + constant vcs for full participation due to lethargy    Balance Overall balance assessment: Needs assistance Sitting-balance support: Bilateral upper extremity supported, Feet supported Sitting balance-Leahy Scale: Good     Standing balance support: Bilateral upper extremity supported, During functional activity, Reliant on assistive device for balance Standing balance-Leahy Scale: Fair Standing balance comment: no LOB while standing holding to RW       Cognition Arousal: Lethargic Behavior During Therapy: Flat affect Overall Cognitive Status: Impaired/Different from baseline Area of Impairment: Attention, Following commands, Awareness, Problem solving  Current Attention Level: Focused   Following Commands: Follows one step commands with increased time     Problem Solving: Requires verbal cues, Requires tactile cues, Slow processing General Comments: Pt required constant verbal cues throughout session to keep eyes open               Pertinent Vitals/Pain Pain Assessment Pain Assessment: PAINAD Breathing: occasional labored breathing, short period of hyperventilation Negative Vocalization: occasional moan/groan, low speech, negative/disapproving quality Facial Expression: smiling or inexpressive Body Language: relaxed Consolability: distracted or reassured by voice/touch PAINAD Score: 3 Pain Location: R Hip, R arm Pain Descriptors / Indicators: Aching, Discomfort, Moaning, Grimacing Pain Intervention(s): Limited activity within patient's tolerance, Monitored during  session, Premedicated before session, Repositioned  PT Goals (current goals can now be found in the care plan section) Acute Rehab PT Goals Patient Stated Goal: none stated Progress towards PT goals: Progressing toward goals    Frequency    Min 1X/week       Co-evaluation     PT goals addressed during session: Mobility/safety with mobility;Balance;Proper use of DME;Strengthening/ROM        AM-PAC PT "6 Clicks" Mobility   Outcome Measure  Help needed turning from your back to your side while in a flat bed without using bedrails?: A Little Help needed moving from lying on your back to sitting on the side of a flat bed without using bedrails?: A Little Help needed moving to and from a bed to a chair (including a wheelchair)?: A Lot Help needed standing up from a chair using your arms (e.g., wheelchair or bedside chair)?: A Lot Help needed to walk in hospital room?: A Lot Help needed climbing 3-5 steps with a railing? : A Lot 6 Click Score: 14    End of Session Equipment Utilized During Treatment: Cervical collar Activity Tolerance: Patient limited by lethargy Patient left: in bed;with call bell/phone within reach;with bed alarm set;with family/visitor present Nurse Communication: Mobility status PT Visit Diagnosis: Unsteadiness on feet (R26.81);Muscle weakness (generalized) (M62.81);Pain;Dizziness and giddiness (R42) Pain - Right/Left: Right Pain - part of body: Hip;Shoulder     Time: 1540-1557 PT Time Calculation (min) (ACUTE ONLY): 17 min  Charges:    $Therapeutic Activity: 8-22 mins PT General Charges $$ ACUTE PT VISIT: 1 Visit                     Jetta Lout PTA 04/18/23, 4:12 PM

## 2023-04-18 NOTE — Consult Note (Signed)
PHARMACY CONSULT NOTE - ELECTROLYTES  Pharmacy Consult for Electrolyte Monitoring and Replacement   Recent Labs: Height: 5\' 6"  (167.6 cm) Weight: 60.1 kg (132 lb 7.9 oz) IBW/kg (Calculated) : 59.3 Estimated Creatinine Clearance: 42.7 mL/min (A) (by C-G formula based on SCr of 1.23 mg/dL (H)). Potassium (mmol/L)  Date Value  04/18/2023 4.3  02/21/2013 3.9   Magnesium (mg/dL)  Date Value  11/91/4782 2.0  02/19/2013 1.6 (L)   Calcium (mg/dL)  Date Value  95/62/1308 8.6 (L)   Calcium, Total (mg/dL)  Date Value  65/78/4696 8.1 (L)   Albumin (g/dL)  Date Value  29/52/8413 3.1 (L)  02/19/2013 4.2   Phosphorus (mg/dL)  Date Value  24/40/1027 2.9   Sodium (mmol/L)  Date Value  04/18/2023 140  02/21/2013 142    Assessment  [name] is a 67 y.o. female presenting with cervical spinal cord injury after a fall.  PMH significant for arthritis, DM, diverticulitis, CKD. Pharmacy has been consulted to monitor and replace electrolytes.  Diet: carb modified  Goal of Therapy: Electrolytes WNL  Plan:  No replacement needed at this time Check BMP with AM labs  Thank you for allowing pharmacy to be a part of this patient's care.  Bettey Costa, PharmD Clinical Pharmacist 04/18/2023 8:40 AM

## 2023-04-18 NOTE — Progress Notes (Signed)
History: [name] is here for a cervical spinal cord injury.  She was admitted to the intensive care unit for blood pressure management and serial examinations.    Physical Exam:                           SpO2: 94% 96%      AA Ox3 CNI   She continues to have notable weakness in the right hand in terms of grip which is approximately 3 out of 5 in intrinsic finger function which is approximately 1-2 out of 5.  She does have a significant amount of pain associated with this examination so full confrontational examination of the right upper extremity is difficult.  She is able to flex and extend her elbows at least antigravity but has severe pain when pressure is placed.  She is able to abduct her shoulder to at least 50 to 75 degrees.  On the contralateral side she has good strength and briskly follows commands with limited pain.  In her right lower extremity she has some pain with hip flexion but is able to at least clear slightly off the bed.  She is able to plantarflex and dorsiflex but also has weakness at that point.   Data:            Other tests/results: No new testing, known cervical stenosis on previous imaging.   Assessment/Plan:   [name] is here for a cervical spinal cord injury, mixed subtype of central cord versus hemicord.  She has longstanding pre-existing cervical stenosis which puts her at risk for cervical central cord injury.  Given her mechanism of a fall striking the back of her head and having forced flexion this was likely her mechanism.  She has severe pain in her right hand and right leg.  She was admitted to the intensive care unit for blood pressure management and elevated MAP goals.  We did discuss surgical intervention with the family, and that it could be done either emergently or in a delayed fashion.  Their preference was to wait until her acute injury has fully declared itself.  In that case we planned for full medical management.   Can relax  MAP goals today. -Cervical collar when up and out of bed -No need for strict bedrest -Physical and Occupational Therapy evaluations and treatments.   May be a candidate for cervical decompression and fusion in the next week if that's what family and patient would like.  Karenann Cai MD, PhD

## 2023-04-19 ENCOUNTER — Inpatient Hospital Stay: Payer: Medicare HMO

## 2023-04-19 DIAGNOSIS — G9520 Unspecified cord compression: Secondary | ICD-10-CM | POA: Diagnosis not present

## 2023-04-19 LAB — BASIC METABOLIC PANEL
Anion gap: 10 (ref 5–15)
BUN: 43 mg/dL — ABNORMAL HIGH (ref 8–23)
CO2: 23 mmol/L (ref 22–32)
Calcium: 8.5 mg/dL — ABNORMAL LOW (ref 8.9–10.3)
Chloride: 108 mmol/L (ref 98–111)
Creatinine, Ser: 1.44 mg/dL — ABNORMAL HIGH (ref 0.44–1.00)
GFR, Estimated: 40 mL/min — ABNORMAL LOW (ref >60)
Glucose, Bld: 116 mg/dL — ABNORMAL HIGH (ref 70–99)
Potassium: 4.7 mmol/L (ref 3.5–5.1)
Sodium: 141 mmol/L (ref 135–145)

## 2023-04-19 LAB — GLUCOSE, CAPILLARY
Glucose-Capillary: 124 mg/dL — ABNORMAL HIGH (ref 70–99)
Glucose-Capillary: 149 mg/dL — ABNORMAL HIGH (ref 70–99)
Glucose-Capillary: 147 mg/dL — ABNORMAL HIGH (ref 70–99)
Glucose-Capillary: 152 mg/dL — ABNORMAL HIGH (ref 70–99)

## 2023-04-19 LAB — MAGNESIUM: Magnesium: 2.2 mg/dL (ref 1.7–2.4)

## 2023-04-19 LAB — PHOSPHORUS: Phosphorus: 3 mg/dL (ref 2.5–4.6)

## 2023-04-19 NOTE — Progress Notes (Addendum)
Progress Note   Patient: [name] ZDG:[id number] DOB: 18-Jul-1955 DOA: 04/13/2023     6 DOS: the patient was seen and examined on 04/19/2023   Subjective:  Patient seen and examined at bedside this morning Denied nausea vomiting abdominal pain chest pain or cough Patient working with PT OT   Brief hospital course: 67 yo female who presented to [facility] ER via EMS on 10/7 following a fall. Per ER notes she was helping a friend and fell backwards. She reports no memory of the fall but there is a question of if she loss consciousness. EMS placed pt in a C-Collar. Following the fall she developed a burning pain in her bilateral upper extremities which is worse on the right. She also had some burning pain in her feet.   Assessment and Plan:  Admitted for Acute Cervical Spinal Cord Injury s/p fall Patient has past medical history of cervical stenosis with neuropathy According to neurosurgery had longstanding pre-existing cervical stenosis puts her at high risks of cervical central cord injury Surgical intervention was discussed with patient and her family and family have decided on waiting for the acute injury to resolve before making final decision Patient initially required vasopressors however currently off plan of care discussed with neurosurgery Monitor neurochecks closely Continue PT OT Continue cervical collar Out of bed as tolerated Continue catheter for urinary retention -Continue outpatient Gabapentin Continue as needed pain management   Acute Fracture of the right superior and inferior pubic rami -Orthopedics consulted, appreciate input ~ recommended weightbearing as tolerated Continue PT/OT Continue current pain control     ACUTE ANEMIA We will transfuse as needed to keep Hb greater than 7 Continue to monitor CBC    Diet/type: Regular consistency (see orders) DVT prophylaxis: prophylactic heparin  GI prophylaxis: PPI Lines: Arterial line, and is still needed Foley:   Yes, and it is still needed Code Status:  full code Last date of multidisciplinary goals of care discussion [10/11]      Physical Exam: General Appearance: No distress  EYES PERRLA, EOM intact.   NECK Supple, No JVD Pulmonary: normal breath sounds, No wheezing.  CardiovascularNormal S1,S2.  No m/r/g.   Abdomen: Benign, Soft, non-tender. Neurology Sleeping, arouses easily to voice, alert and oriented x 4, RUE strength and pain improved,right lower extremities weaker as compared to the left (2-3 out of 5) with pain, positive sensation in all 4 extremities Ext pulses intact, cap refill intact  Data Reviewed: I have reviewed patient's lab reports as shown below as well as vitals and neurologist documentation   Disposition: Status is: Inpatient   Planned Discharge Destination: Pending medical stabilization  Time spent: 45 minutes Vitals:   04/18/23 2157 04/19/23 0351 04/19/23 0500 04/19/23 0816  BP: (!) 103/58 (!) 99/58  97/61  Pulse: 92 94  99  Resp: 18 16  18   Temp: 97.8 F (36.6 C) 98.3 F (36.8 C)  97.9 F (36.6 C)  TempSrc: Oral     SpO2: 98% 96%  98%  Weight:   62.7 kg   Height:          Latest Ref Rng & Units 04/18/2023    4:21 AM 04/17/2023    3:57 AM 04/16/2023    4:44 AM  CBC  WBC 4.0 - 10.5 K/uL 8.9  8.8  7.8   Hemoglobin 12.0 - 15.0 g/dL 9.0  8.7  8.8   Hematocrit 36.0 - 46.0 % 27.4  25.9  26.7   Platelets 150 - 400 K/uL 219  185  181        Latest Ref Rng & Units 04/19/2023    3:47 AM 04/18/2023    4:21 AM 04/17/2023    3:57 AM  BMP  Glucose 70 - 99 mg/dL 272  536  644   BUN 8 - 23 mg/dL 43  33  34   Creatinine 0.44 - 1.00 mg/dL 0.34  7.42  5.95   Sodium 135 - 145 mmol/L 141  140  141   Potassium 3.5 - 5.1 mmol/L 4.7  4.3  4.5   Chloride 98 - 111 mmol/L 108  106  105   CO2 22 - 32 mmol/L 23  23  18    Calcium 8.9 - 10.3 mg/dL 8.5  8.6  8.8       Author: Loyce Dys, MD 04/19/2023 5:23 PM  For on call review [email or web address].

## 2023-04-19 NOTE — Progress Notes (Signed)
History: [name] is here for a cervical spinal cord injury.  She was admitted to the intensive care unit for blood pressure management and serial examinations.    Physical Exam:                                              SpO2: 94% 96%      AA Ox3 CNI   She continues to have notable weakness in the right hand in terms of grip which is approximately 3 out of 5 in intrinsic finger function which is approximately 1-2 out of 5.  She does have a significant amount of pain associated with this examination so full confrontational examination of the right upper extremity is difficult.  She is able to flex and extend her elbows at least antigravity but has severe pain when pressure is placed.  She is able to abduct her shoulder to at least 50 to 75 degrees.  On the contralateral side she has good strength and briskly follows commands with limited pain.  In her right lower extremity she has some pain with hip flexion but is able to at least clear slightly off the bed.  She is able to plantarflex and dorsiflex but also has weakness at that point.   Data:            Other tests/results: No new testing, known cervical stenosis on previous imaging.   Assessment/Plan:   [name] is here for a cervical spinal cord injury, mixed subtype of central cord versus hemicord.  She has longstanding pre-existing cervical stenosis which puts her at risk for cervical central cord injury.  Given her mechanism of a fall striking the back of her head and having forced flexion this was likely her mechanism.  She has severe pain in her right hand and right leg.  She was admitted to the intensive care unit for blood pressure management and elevated MAP goals.  We did discuss surgical intervention with the family, and that it could be done either emergently or in a delayed fashion.  Their preference was to wait until her acute injury has fully declared itself.  In that case we planned for full medical  management.   Can relax MAP goals today. -Cervical collar when up and out of bed -No need for strict bedrest -Physical and Occupational Therapy evaluations and treatments.  - Patient is interested in surgical intervention "if it helped with my back pain".     May be a candidate for cervical decompression and fusion in the next week if that's what family and patient would like.   Karenann Cai MD, PhD

## 2023-04-19 NOTE — Evaluation (Signed)
Clinical/Bedside Swallow Evaluation Patient Details  Name: [name] MRN: [id number] Date of Birth: 1956/03/02  Today's Date: 04/19/2023 Time: SLP Start Time (ACUTE ONLY): 1030 SLP Stop Time (ACUTE ONLY): 1053 SLP Time Calculation (min) (ACUTE ONLY): 23 min  Past Medical History:  Past Medical History:  Diagnosis Date   Arthritis    Blood dyscrasia    patient stated she is a "free bleeder"   Diabetes mellitus without complication (HCC)    Diverticulosis    Hypothyroidism    Neuropathy    Renal insufficiency    Past Surgical History:  Past Surgical History:  Procedure Laterality Date   ABDOMINAL HYSTERECTOMY     BILATERAL SALPINGECTOMY Bilateral 03/16/2017   Procedure: BILATERAL SALPINGECTOMY;  Surgeon: Suzy Bouchard, MD;  Location: [facility] ORS;  Service: Gynecology;  Laterality: Bilateral;   COLONOSCOPY WITH PROPOFOL N/A 08/18/2018   Procedure: COLONOSCOPY WITH PROPOFOL;  Surgeon: Scot Jun, MD;  Location: [facility] ENDOSCOPY;  Service: Endoscopy;  Laterality: N/A;   CYSTOCELE REPAIR N/A 03/16/2017   Procedure: ANTERIOR REPAIR (CYSTOCELE);  Surgeon: Schermerhorn, Ihor Austin, MD;  Location: [facility] ORS;  Service: Gynecology;  Laterality: N/A;   ESOPHAGOGASTRODUODENOSCOPY (EGD) WITH PROPOFOL N/A 08/18/2018   Procedure: ESOPHAGOGASTRODUODENOSCOPY (EGD) WITH PROPOFOL;  Surgeon: Scot Jun, MD;  Location: [facility] ENDOSCOPY;  Service: Endoscopy;  Laterality: N/A;   TEAR DUCT PROBING Right    TUBAL LIGATION     VAGINAL HYSTERECTOMY N/A 03/16/2017   Procedure: HYSTERECTOMY VAGINAL;  Surgeon: Schermerhorn, Ihor Austin, MD;  Location: [facility] ORS;  Service: Gynecology;  Laterality: N/A;   WISDOM TOOTH EXTRACTION     HPI:  Pt is a 67 year old female presents with the chief complaint of cervical spinal cord injury after a fall, admitted with cervical spinal cord injury central vs hemicord pattern; Acute Fracture of the right superior and inferior pubic rami- WBAT    PMH significant for  Arthritis, Blood dyscrasia, Diabetes mellitus without complication (HCC), Diverticulosis, Hypothyroidism, Neuropathy, and Renal insufficiency.    Assessment / Plan / Recommendation  Clinical Impression  Pt seen for clinical swallowing evaluation. Pt with waxing/waning LOA. Low vocal intensity and weak/congested cough (?effort). Slightly reduced R facial ROM and ?flattening of R nasolabial fold (vs. natural asymettry). Edentulous; pt owns dentures, but they are not donned for evaluation.  Pt received upright in recliner with hard collar donned. Assistance needed for a more upright position, pt with R lateral lean. Pt with s/sx concerning for oral and suspected pharyngeal dysphagia. Pt with prolonged mastication and oral holding with solids. Pharyngeally, pt with immediate wet coughing with sequential sips of thin liquids. No overt s/sx with tsps of puree or single straw sips of thin liquids. Cup sip not trial as pt with propensity toward neck extension with cup sip attempts. Pt required assistance with feeding due to UE weakness and overall waxing/waning LOA/mental status. Pt endorsed solid and liquid dysphagia PTA. This is noted during chart review. Of note, pt seen by GI as an outpatient in September 2024. At that time it was recommended for further esophageal assessment  including upper endoscopy, colonoscopy, and Barium Swallow. Pt is at increased risk of aspiration/aspiration PNA given lethargy, mental status, comorbidities, hx of dysphagia, and need for assistance with feeding at present. SLP to f/u per POC for diet tolerance and consideration for instrumental swallowing evaluation. Recommend cautious initiation of a puree diet with thin liquids with safe swallowing strategies/aspiration precautions and reflux precautions as outlined belo. Should pt demonstrate any increased difficulty swallowing,  please make pt NPO and contact SLP. SLP Visit Diagnosis: Dysphagia, oropharyngeal phase (R13.12)     Aspiration Risk  Mild aspiration risk;Moderate aspiration risk    Diet Recommendation Dysphagia 1 (Puree);Thin liquid    Liquid Administration via: Straw Medication Administration:  (one at a time; as tolerated) Supervision: Staff to assist with self feeding;Full supervision/cueing for compensatory strategies Compensations: Minimize environmental distractions;Slow rate;Small sips/bites (SINGLE SIPS! Check mouth for clearance given lethargy) Postural Changes: Seated upright at 90 degrees;Remain upright for at least 30 minutes after po intake (reflux precautions)    Other  Recommendations Recommended Consults: Consider GI evaluation;Consider esophageal assessment (esophageal assessment recommended per GI on 03/18/23 including upper endoscopy, colonoscopy, and Barium Swallow) Oral Care Recommendations: Oral care QID    Recommendations for follow up therapy are one component of a multi-disciplinary discharge planning process, led by the attending physician.  Recommendations may be updated based on patient status, additional functional criteria and insurance authorization.  Follow up Recommendations  (TBD)      Assistance Recommended at Discharge    Functional Status Assessment Patient has had a recent decline in their functional status and demonstrates the ability to make significant improvements in function in a reasonable and predictable amount of time.  Frequency and Duration min 2x/week  2 weeks       Prognosis Prognosis for improved oropharyngeal function: Fair Barriers to Reach Goals: Severity of deficits      Swallow Study   General Date of Onset: 04/13/23 (admit date) HPI: Pt is a 67 year old female presents with the chief complaint of cervical spinal cord injury after a fall, admitted with cervical spinal cord injury central vs hemicord pattern; Acute Fracture of the right superior and inferior pubic rami- WBAT    PMH significant for Arthritis, Blood dyscrasia, Diabetes  mellitus without complication (HCC), Diverticulosis, Hypothyroidism, Neuropathy, and Renal insufficiency. Type of Study: Bedside Swallow Evaluation Previous Swallow Assessment: none Diet Prior to this Study: Regular;Thin liquids (Level 0) Temperature Spikes Noted: No Respiratory Status: Room air History of Recent Intubation: No Behavior/Cognition: Lethargic/Drowsy;Requires cueing Oral Cavity Assessment: Within Functional Limits Oral Care Completed by SLP: Yes Oral Cavity - Dentition: Dentures, top;Dentures, bottom (not donned for evaluation) Vision: Functional for self-feeding (difficulty with feeding due to UE weakness) Self-Feeding Abilities: Total assist;Needs assist Patient Positioning: Upright in bed Baseline Vocal Quality: Low vocal intensity Volitional Cough: Weak;Congested Volitional Swallow: Able to elicit    Oral/Motor/Sensory Function Overall Oral Motor/Sensory Function: Mild impairment Facial ROM: Reduced right (vs natural asymmetry) Facial Symmetry: Abnormal symmetry right (vs natural asymmetry)   Ice Chips Ice chips: Not tested   Thin Liquid Thin Liquid: Impaired Presentation: Straw Oral Phase Impairments:  (WFL) Pharyngeal  Phase Impairments: Wet Vocal Quality;Cough - Immediate    Nectar Thick Nectar Thick Liquid: Not tested   Honey Thick Honey Thick Liquid: Not tested   Puree Puree: Within functional limits Presentation: Spoon   Solid     Solid: Impaired Oral Phase Impairments: Poor awareness of bolus;Impaired mastication Oral Phase Functional Implications: Impaired mastication;Prolonged oral transit Pharyngeal Phase Impairments: Cough - Delayed     Clyde Canterbury, M.S., CCC-SLP Speech-Language Pathologist [facility] [phone] (ASCOM)  Alessandra Bevels Tashi Band 04/19/2023,11:17 AM

## 2023-04-19 NOTE — Plan of Care (Signed)
  Problem: Education: Goal: Ability to describe self-care measures that may prevent or decrease complications (Diabetes Survival Skills Education) will improve Outcome: Progressing Goal: Individualized Educational Video(s) Outcome: Progressing   Problem: Coping: Goal: Ability to adjust to condition or change in health will improve Outcome: Progressing   Problem: Fluid Volume: Goal: Ability to maintain a balanced intake and output will improve Outcome: Progressing   Problem: Health Behavior/Discharge Planning: Goal: Ability to identify and utilize available resources and services will improve Outcome: Progressing Goal: Ability to manage health-related needs will improve Outcome: Progressing   Problem: Metabolic: Goal: Ability to maintain appropriate glucose levels will improve Outcome: Progressing   Problem: Nutritional: Goal: Maintenance of adequate nutrition will improve Outcome: Progressing Goal: Progress toward achieving an optimal weight will improve Outcome: Progressing   Problem: Skin Integrity: Goal: Risk for impaired skin integrity will decrease Outcome: Progressing   Problem: Tissue Perfusion: Goal: Adequacy of tissue perfusion will improve Outcome: Progressing   Problem: Education: Goal: Knowledge of General Education information will improve Description: Including pain rating scale, medication(s)/side effects and non-pharmacologic comfort measures Outcome: Progressing   Problem: Health Behavior/Discharge Planning: Goal: Ability to manage health-related needs will improve Outcome: Progressing   Problem: Clinical Measurements: Goal: Ability to maintain clinical measurements within normal limits will improve Outcome: Progressing Goal: Will remain free from infection Outcome: Progressing Goal: Diagnostic test results will improve Outcome: Progressing Goal: Respiratory complications will improve Outcome: Progressing Goal: Cardiovascular complication will  be avoided Outcome: Progressing   Problem: Activity: Goal: Risk for activity intolerance will decrease Outcome: Progressing   Problem: Nutrition: Goal: Adequate nutrition will be maintained Outcome: Progressing   Problem: Coping: Goal: Level of anxiety will decrease Outcome: Progressing   Problem: Elimination: Goal: Will not experience complications related to bowel motility Outcome: Progressing Goal: Will not experience complications related to urinary retention Outcome: Progressing   Problem: Pain Managment: Goal: General experience of comfort will improve Outcome: Progressing   Problem: Safety: Goal: Ability to remain free from injury will improve Outcome: Progressing   Problem: Skin Integrity: Goal: Risk for impaired skin integrity will decrease Outcome: Progressing   Problem: Activity: Goal: Ability to perform activities at highest level will improve Outcome: Progressing Goal: Muscle strength will improve Outcome: Progressing   Problem: Bowel/Gastric: Goal: Ability to demonstrate the techniques of an individualized bowel program will improve Outcome: Progressing   Problem: Education: Goal: Knowledge of disease or condition will improve Outcome: Progressing Goal: Knowledge of the prescribed therapeutic regimen will improve Outcome: Progressing   Problem: Coping: Goal: Ability to identify and develop effective coping behavior will improve Outcome: Progressing Goal: Ability to verbalize feelings will improve Outcome: Progressing   Problem: Self-Care: Goal: Ability to participate in self-care as condition permits will improve Outcome: Progressing   Problem: Skin Integrity: Goal: Risk for impaired skin integrity will decrease Outcome: Progressing   Problem: Urinary Elimination: Goal: Ability to achieve a regular elimination pattern will improve Outcome: Progressing

## 2023-04-19 NOTE — Progress Notes (Signed)
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    PT Comments  Pt seen for PT tx with pt agreeable, son present during session. Pt is making steady progress with mobility. Pt requires mod assist for bed mobility, mod/max assist for STS, and is able to ambulate in room/bathroom with RW & min assist. Pt does c/o dizziness with initial position changes (BP in recliner in LUE: 108/65 mmHg MAP 76, HR 100 bpm). Pt with c/o R hip, R forearm throughout session - MD made aware. Pt also with prolonged coughing following swallowing pills - nurse & MD made aware. Pt remains an excellent rehab candidate & would benefit from ongoing PT services to maximize independence with mobility, reduce fall risk, & decrease caregiver burden.    If plan is discharge home, recommend the following: A lot of help with walking and/or transfers;A lot of help with bathing/dressing/bathroom;Assist for transportation;Help with stairs or ramp for entrance   Can travel by private vehicle        Equipment Recommendations  Rolling walker (2 wheels)    Recommendations for Other Services       Precautions / Restrictions Precautions Precautions: Cervical;Fall Required Braces or Orthoses: Cervical Brace Cervical Brace: Hard collar (rigid [place] brace) Restrictions Weight Bearing Restrictions: Yes RLE Weight Bearing: Weight bearing as tolerated Other Position/Activity Restrictions: per orders, ok to donn collar edge of  bed, amb to bathroom/shower without c collar     Mobility  Bed Mobility Overal bed mobility: Needs Assistance Bed Mobility: Sidelying to Sit, Sit to Supine, Sit to Sidelying, Rolling Rolling: Min assist, Used rails Sidelying to sit: Mod assist, Used rails, HOB elevated       General bed mobility comments: Cuing/assistance for BLE positioning in preparation for log rolling. Pt is able to roll to L with supervision, extra time, cuing for technique. Pt requires mod assist to upright trunk from sidelying to sitting.    Transfers Overall transfer level: Needs assistance Equipment used: Rolling walker (2 wheels) Transfers: Sit to/from Stand, Bed to chair/wheelchair/BSC Sit to Stand: Mod assist, Max assist   Step pivot transfers: Min assist (extra time)       General transfer comment: STS from EOB, recliner, low toilet. PT provides cuing re: hand placement & pt requires assistance to power up to standing.    Ambulation/Gait Ambulation/Gait assistance: Min assist Gait Distance (Feet): 28 Feet (+ 10 ft) Assistive device: Rolling walker (2 wheels) Gait Pattern/deviations: Decreased step length - right, Decreased step length - left, Decreased stride length, Decreased dorsiflexion - right, Decreased dorsiflexion - left, Narrow base of support Gait velocity: decreased     General Gait Details: Pt ambulates in room/bathroom with RW & min assist with cuing to open eyes at times.   Stairs             Wheelchair Mobility     Tilt Bed    Modified Rankin (Stroke Patients Only)       Balance   Sitting-balance support: Bilateral upper extremity supported, Feet supported Sitting balance-Leahy Scale: Good Sitting balance - Comments:  able to maintain seated EOB balance with BUE support   Standing balance support: Bilateral upper extremity supported, During functional activity, Reliant on assistive device for balance, Single extremity supported Standing balance-Leahy Scale:  Poor Standing balance comment: hand hygiene standing at sink with min assist for balance                            Cognition Arousal: Lethargic Behavior During Therapy: Flat affect Overall Cognitive Status: Impaired/Different from baseline Area of Impairment: Orientation, Memory, Following commands, Safety/judgement, Awareness, Attention, Problem solving                       Following Commands: Follows one step commands inconsistently, Follows one step commands with increased time Safety/Judgement: Decreased awareness of deficits, Decreased awareness of safety Awareness: Emergent Problem Solving: Requires verbal cues, Requires tactile cues, Slow processing General Comments: Pt received asleep in bed, awakened, & pt repeatedly states "why do you have to move me?" despite PT educating her. Pt lethargic during session, requiring cuing to open eyes vs walking with eyes closed. Pt with delayed initiation, requiring extra time & multimodal cuing throughout session. Pt asking why she has to wear cervical collar when she only broke her hip. PT educated pt on spinal cord injury.        Exercises      General Comments General comments (skin integrity, edema, etc.): Pt with continent void on toilet. PT assists pt with donning/adjusting cervical brace in bed & sitting EOB.      Pertinent Vitals/Pain Pain Assessment Pain Assessment: Faces Faces Pain Scale: Hurts even more Pain Location: R hip, R arm Pain Descriptors / Indicators: Grimacing, Guarding, Discomfort Pain Intervention(s): Monitored during session, RN gave pain meds during session    Home Living                          Prior Function            PT Goals (current goals can now be found in the care plan section) Acute Rehab PT Goals Patient Stated Goal: none stated PT Goal Formulation: With patient Time For Goal Achievement: 04/29/23 Potential to Achieve Goals: Good Progress towards PT goals:  Progressing toward goals    Frequency    Min 1X/week      PT Plan      Co-evaluation              AM-PAC PT "6 Clicks" Mobility   Outcome Measure  Help needed turning from your back to your side while in a flat bed without using bedrails?: A Little Help needed moving from lying on your back to sitting on the side of a flat bed without using bedrails?: A Lot Help needed moving to and from a bed to a chair (including a wheelchair)?: A Little Help needed standing up from a chair using your arms (e.g., wheelchair or bedside chair)?: A Lot Help needed to walk in hospital room?: A Little Help needed climbing 3-5 steps with a railing? : A Lot 6 Click Score: 15    End of Session Equipment Utilized During Treatment: Cervical collar;Gait belt Activity Tolerance: Patient tolerated treatment well Patient left: in chair;with chair alarm set;with call bell/phone within reach;with family/visitor present Nurse Communication: Mobility status PT Visit Diagnosis: Unsteadiness on feet (R26.81);Muscle weakness (generalized) (M62.81);Pain;Dizziness and giddiness (R42) Pain - Right/Left: Right Pain - part of body: Arm;Hip  Time: 4098-1191 PT Time Calculation (min) (ACUTE ONLY): 44 min  Charges:    $Therapeutic Activity: 38-52 mins PT General Charges $$ ACUTE PT VISIT: 1 Visit                     Aleda Grana, PT, DPT 04/19/23, 9:18 AM    Sandi Mariscal 04/19/2023, 9:16 AM

## 2023-04-19 NOTE — Progress Notes (Signed)
Patient was alert only to self upon transfer from ICU around 2200. Was drowsy and unable to stay awake. Could not take po neurontin. She woke up around 0230 alert and oriented x4. Got patient up x1 assist to the [facility] to urinate-applied neck brace. Patient stated that she does not remember the process of moving to a different unit. Explained everything to her and what unit hse was currently on. Previous unit reported transfer to family per report.Patient denied pain and additional needs.

## 2023-04-20 DIAGNOSIS — G9520 Unspecified cord compression: Secondary | ICD-10-CM | POA: Diagnosis not present

## 2023-04-20 LAB — BASIC METABOLIC PANEL
Anion gap: 13 (ref 5–15)
BUN: 51 mg/dL — ABNORMAL HIGH (ref 8–23)
CO2: 24 mmol/L (ref 22–32)
Calcium: 8.9 mg/dL (ref 8.9–10.3)
Chloride: 105 mmol/L (ref 98–111)
Creatinine, Ser: 1.35 mg/dL — ABNORMAL HIGH (ref 0.44–1.00)
GFR, Estimated: 44 mL/min — ABNORMAL LOW (ref >60)
Glucose, Bld: 136 mg/dL — ABNORMAL HIGH (ref 70–99)
Potassium: 4.4 mmol/L (ref 3.5–5.1)
Sodium: 142 mmol/L (ref 135–145)

## 2023-04-20 LAB — CBC WITH DIFFERENTIAL/PLATELET
Abs Immature Granulocytes: 0.09 10*3/uL — ABNORMAL HIGH (ref 0.00–0.07)
Basophils Absolute: 0 10*3/uL (ref 0.0–0.1)
Basophils Relative: 1 %
Eosinophils Absolute: 0.1 10*3/uL (ref 0.0–0.5)
Eosinophils Relative: 3 %
HCT: 27.8 % — ABNORMAL LOW (ref 36.0–46.0)
Hemoglobin: 9 g/dL — ABNORMAL LOW (ref 12.0–15.0)
Immature Granulocytes: 2 %
Lymphocytes Relative: 29 %
Lymphs Abs: 1.6 10*3/uL (ref 0.7–4.0)
MCH: 28.3 pg (ref 26.0–34.0)
MCHC: 32.4 g/dL (ref 30.0–36.0)
MCV: 87.4 fL (ref 80.0–100.0)
Monocytes Absolute: 0.5 10*3/uL (ref 0.1–1.0)
Monocytes Relative: 10 %
Neutro Abs: 3.1 10*3/uL (ref 1.7–7.7)
Neutrophils Relative %: 55 %
Platelets: 215 10*3/uL (ref 150–400)
RBC: 3.18 MIL/uL — ABNORMAL LOW (ref 3.87–5.11)
RDW: 13.8 % (ref 11.5–15.5)
WBC: 5.5 10*3/uL (ref 4.0–10.5)
nRBC: 0 % (ref 0.0–0.2)

## 2023-04-20 LAB — GLUCOSE, CAPILLARY
Glucose-Capillary: 160 mg/dL — ABNORMAL HIGH (ref 70–99)
Glucose-Capillary: 133 mg/dL — ABNORMAL HIGH (ref 70–99)

## 2023-04-20 MED ORDER — OXYCODONE-ACETAMINOPHEN 5-325 MG PO TABS
1.0000 | ORAL_TABLET | Freq: Four times a day (QID) | ORAL | Status: DC | PRN
  Administered 2023-04-20: 1 via ORAL
  Filled 2023-04-20 (×2): qty 1

## 2023-04-20 NOTE — Progress Notes (Signed)
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    PT Comments  Patient is agreeable to PT. Patient more alert and interactive today. She continues to require assistance with bed mobility, transfers, and ambulation. Gait training continued today with rolling walker and cues for sequencing. MAP of 74 with no dizziness reported with mobility. The patient is hopeful to go to inpatient rehab at discharge. Recommend to continue PT to maximize independence and facilitate return to prior level of function. Continue to recommend intensive rehabilitation after this hospital stay.    If plan is discharge home, recommend the following: A lot of help with walking and/or transfers;A lot of help with bathing/dressing/bathroom;Assist for transportation;Help with stairs or ramp for entrance   Can travel by private vehicle        Equipment Recommendations  Rolling walker (2 wheels)    Recommendations for Other Services       Precautions / Restrictions Precautions Precautions: Cervical;Fall Restrictions Weight Bearing Restrictions: Yes RLE Weight Bearing: Weight bearing as tolerated     Mobility  Bed Mobility Overal bed mobility: Needs Assistance Bed Mobility: Sidelying to Sit, Sit to Supine, Sit to Sidelying, Rolling Rolling: Min assist Sidelying to sit: Min assist     Sit to sidelying: Min assist General bed mobility comments: assistance for trunk support to sit upright. assistance for LE  support to return to bed. verbal cues for logroll technique. C-collar donned in sitting prior to mobility and removed after return to bed    Transfers Overall transfer level: Needs assistance Equipment used: Rolling walker (2 wheels) Transfers: Sit to/from Stand Sit to Stand: Mod assist           General transfer comment: lifting and lowering assistance provided. cues for safety with sitting as patient attempts to sit before correctly positioned on the bed    Ambulation/Gait Ambulation/Gait assistance: Min assist Gait Distance (Feet): 18 Feet Assistive device: Rolling walker (2 wheels) Gait Pattern/deviations: Decreased step length - right, Decreased step length - left, Decreased stride length, Decreased dorsiflexion - right, Decreased dorsiflexion - left, Narrow base of support Gait velocity: decreased     General Gait Details: steadying assistance reuqired as well as intermittent assistance for rolling walker negotiation. verbal cues for rolling walker and BLE sequencing   Stairs             Wheelchair Mobility     Tilt Bed    Modified Rankin (Stroke Patients Only)       Balance Overall balance assessment: Needs assistance Sitting-balance support: Bilateral upper extremity supported, Feet supported Sitting balance-Leahy Scale: Good     Standing balance support: Bilateral upper extremity supported, During functional activity, Reliant on assistive device for balance, Single extremity supported Standing balance-Leahy Scale: Poor Standing balance comment: external support required                            Cognition Arousal: Alert Behavior During Therapy: WFL for tasks assessed/performed Overall Cognitive Status: Impaired/Different from baseline  Safety/Judgement: Decreased awareness of deficits, Decreased awareness of safety   Problem Solving: Requires verbal cues, Requires tactile cues, Slow  processing General Comments: patient is much more interactive today        Exercises      General Comments        Pertinent Vitals/Pain Pain Assessment Pain Assessment: Faces    Home Living                          Prior Function            PT Goals (current goals can now be found in the care plan section) Acute Rehab PT Goals Patient Stated Goal: to have surgery and go go rehab afterwards PT Goal Formulation: With patient Time For Goal Achievement: 04/29/23 Potential to Achieve Goals: Good Progress towards PT goals: Progressing toward goals    Frequency    Min 1X/week      PT Plan      Co-evaluation              AM-PAC PT "6 Clicks" Mobility   Outcome Measure  Help needed turning from your back to your side while in a flat bed without using bedrails?: A Little Help needed moving from lying on your back to sitting on the side of a flat bed without using bedrails?: A Lot Help needed moving to and from a bed to a chair (including a wheelchair)?: A Little Help needed standing up from a chair using your arms (e.g., wheelchair or bedside chair)?: A Lot Help needed to walk in hospital room?: A Little Help needed climbing 3-5 steps with a railing? : A Lot 6 Click Score: 15    End of Session Equipment Utilized During Treatment: Cervical collar;Gait belt Activity Tolerance: Patient tolerated treatment well Patient left: in bed;with call bell/phone within reach;with bed alarm set Nurse Communication: Mobility status PT Visit Diagnosis: Unsteadiness on feet (R26.81);Muscle weakness (generalized) (M62.81);Pain;Dizziness and giddiness (R42) Pain - Right/Left: Right Pain - part of body: Arm;Hip     Time: 1610-9604 PT Time Calculation (min) (ACUTE ONLY): 30 min  Charges:    $Gait Training: 8-22 mins $Therapeutic Activity: 8-22 mins PT General Charges $$ ACUTE PT VISIT: 1 Visit                     Donna Bernard, PT, MPT    Ina Homes 04/20/2023, 12:27 PM

## 2023-04-20 NOTE — TOC Initial Note (Signed)
Transition of Care [facility]) - Initial/Assessment Note    Patient Details  Name: [name] MRN: [id number] Date of Birth: [birth year]  Transition of Care [employer]) CM/SW Contact:    Chapman Fitch, RN Phone Number: 04/20/2023, 1:18 PM  Clinical Narrative:                  Admitted for: Cervical Spinal Cord Injury  Admitted from: home with husband and son PCP: Burnett Sheng Current home health/prior home health/DME: NA  Met with patient at bedside.  She called her husband [name] and had him on speaker phone. They would like to proceed with [facility].  Caitlin at [facility] and MD notified    Expected Discharge Plan: IP Rehab Facility Barriers to Discharge: Continued Medical Work up   Patient Goals and CMS Choice            Expected Discharge Plan and Services                                              Prior Living Arrangements/Services                       Activities of Daily Living   ADL Screening (condition at time of admission) Independently performs ADLs?: Yes (appropriate for developmental age) Is the patient deaf or have difficulty hearing?: No Does the patient have difficulty seeing, even when wearing glasses/contacts?: No Does the patient have difficulty concentrating, remembering, or making decisions?: No  Permission Sought/Granted                  Emotional Assessment              Admission diagnosis:  Pelvic fracture (HCC) [S32.9XXA] Spinal cord compression, post-traumatic (HCC) [G95.20] Central cord syndrome, initial encounter (HCC) [W09.811B] Inferior pubic ramus fracture, right, closed, initial encounter (HCC) [S32.591A] Closed fracture of superior ramus of right pubis, initial encounter (HCC) [S32.511A] Patient Active Problem List   Diagnosis Date Noted   Spinal cord compression, post-traumatic (HCC) 04/13/2023   Central cord syndrome (HCC) 04/13/2023   Muscle weakness of right upper extremity 04/13/2023   Weakness of  right lower extremity 04/13/2023   Pelvic fracture (HCC) 04/13/2023   Cellulitis 08/12/2021   Facial cellulitis 08/11/2021   Dental caries 08/11/2021   COVID-19 virus infection 08/11/2021   Diabetes mellitus without complication (HCC)    Hypothyroidism    Stage 3a chronic kidney disease (HCC)    Non-dose-related adverse reaction to medication    Post-operative state 03/16/2017   AKI (acute kidney injury) (HCC) 01/18/2017   Acute respiratory failure with hypoxia (HCC) 11/04/2015   PCP:  Jerl Mina, MD Pharmacy:   [facility] [address] [place] - [address] [place] [address] Phone: [phone] Fax: [phone]     Social Determinants of Health (SDOH) Social History: SDOH Screenings   Food Insecurity: No Food Insecurity (04/13/2023)  Housing: Low Risk  (04/13/2023)  Transportation Needs: No Transportation Needs (04/13/2023)  Utilities: Not At Risk (04/13/2023)  Tobacco Use: Low Risk  (04/13/2023)   SDOH Interventions:     Readmission Risk Interventions     No data to display

## 2023-04-20 NOTE — Progress Notes (Signed)
Speech Language Pathology Treatment: Dysphagia  Patient Details Name: [name] MRN: [id number] DOB: 1955/10/07 Today's Date: 04/20/2023 Time: 4098-1191 SLP Time Calculation (min) (ACUTE ONLY): 25 min  Assessment / Plan / Recommendation Clinical Impression  Pt seen for diet tolerance and trials of upgraded textures. Pt alert, lethargic/slow to respond at times. Brighter affect than day prior. Cleared with RN. Husband and son at bedside. Per SLP observation, pt tolerating current diet without overt s/sx pharyngeal dysphagia. Pt demonstrated s/sx mild oral dysphagia c/b prolonged mastication of solids which is likely due to dental status and exacerbated by lethargy. Recommend diet upgrade to mech soft diet with thin liquids and safe swallowing strategies/aspiration precautions and reflux precautions as outlined below. Of note, pt seen by GI as an outpatient in September 2024. At that time it was recommended for further esophageal assessment including upper endoscopy, colonoscopy, and Barium Swallow. Pt is at increased risk of aspiration/aspiration PNA given lethargy, mental status, comorbidities, hx of dysphagia, and need for assistance with feeding at present. SLP to f/u per POC for diet tolerance and consideration for instrumental swallowing evaluation.    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      SLP Plan  Continue with current plan of care      Recommendations for follow up therapy are one component of a multi-disciplinary discharge planning process, led by the attending physician.  Recommendations may be updated  based on patient status, additional functional criteria and insurance authorization.    Recommendations  Diet recommendations: Dysphagia 3 (mechanical soft);Thin liquid Liquids provided via: Teaspoon;Cup;Straw Medication Administration: Whole meds with liquid (one at at time as tolerated (vs with puree)) Supervision: Patient able to self feed;Staff to assist with self feeding;Intermittent supervision to cue for compensatory strategies Compensations: Minimize environmental distractions;Slow rate;Small sips/bites (single sips) Postural Changes and/or Swallow Maneuvers: Out of bed for meals;Seated upright 90 degrees (upright 60-90 minutes after meals)                Rehab consult (given overall debility); consider further GI work up given most recent OP GI note Oral care QID   Frequent or constant Supervision/Assistance Dysphagia, oropharyngeal phase (R13.12)     Continue with current plan of care    Clyde Canterbury, M.S., CCC-SLP Speech-Language Pathologist [facility] [phone] Arnette Felts)  Woodroe Chen  04/20/2023, 10:06 AM

## 2023-04-20 NOTE — Progress Notes (Signed)
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   OT comments  Pt is supine in bed on arrival. Easily arousable and agreeable to OT session. She reports 10/10 pain in her RLE, nurse was notified. Pt requiring physical assist and cues for technique with bed mobility, STS, toilet transfer, and mobility using RW in the room (Min to CGA). She stood to perform anterior hygiene with CGA, as well as hand hygiene at the sink. C-collar donned at EOB and removed upon return to bed. Pt is slow moving and continues to have difficulty with communication. No dizziness during session reported. Pt returned to bed with all needs in place and will cont to require skilled acute OT services to maximize his safety and IND to return to PLOF. Pt will benefit from intensive rehab upon DC to ensure return to PLOF.       If plan is discharge home, recommend the following:  Assistance with feeding;Direct supervision/assist for financial management;Help with stairs or ramp for entrance;Direct supervision/assist for medications management;A little help with walking and/or transfers;A lot of help with bathing/dressing/bathroom   Equipment Recommendations  Other (comment) (defer to next venue)    Recommendations for Other Services      Precautions / Restrictions Precautions Precautions: Cervical;Fall Cervical Brace: Hard collar Restrictions Weight Bearing Restrictions: Yes RLE Weight Bearing: Weight bearing as  tolerated       Mobility Bed Mobility Overal bed mobility: Needs Assistance Bed Mobility: Sidelying to Sit, Sit to Supine, Sit to Sidelying, Rolling Rolling: Min assist Sidelying to sit: Used rails, HOB elevated, Min assist Supine to sit: Used rails, HOB elevated, Contact guard Sit to supine: Min assist, HOB elevated, Used rails Sit to sidelying: Min assist General bed mobility comments: cueing for logroll techniques, Min A for trunk management to sit at EOB, Min A for BLE management to return to bed. C-collar donned at EOB prior to mobility and removed at EOB to return to supine    Transfers Overall transfer level: Needs assistance Equipment used: Rolling walker (2 wheels) Transfers: Sit to/from Stand Sit to Stand: Min assist, Contact guard assist           General transfer comment: MIN/CGA for toilet transfer to [facility] over top of toilet with cues for safety, e.g. reaching for handrails     Balance Overall balance assessment: Needs assistance Sitting-balance support: Bilateral upper extremity supported, Feet supported Sitting balance-Leahy Scale: Good     Standing balance support: During functional activity, Reliant on assistive device for balance, Single extremity supported Standing balance-Leahy Scale: Poor Standing balance comment: unilateral support/CGA from therapist to perform anterior hygiene in standing                           ADL either performed or assessed with clinical judgement   ADL Overall ADL's : Needs assistance/impaired                         Toilet Transfer: Contact guard assist;Ambulation;Rolling walker (2 wheels);BSC/3in1;Cueing for  safety;Minimal assistance Toilet Transfer Details (indicate cue type and reason): BSC over top of toilet Toileting- Clothing Manipulation and Hygiene: Contact guard assist;Sit to/from stand       Functional mobility during ADLs: Contact guard assist;Rolling walker (2 wheels)       Extremity/Trunk Assessment              Vision       Perception     Praxis      Cognition Arousal: Alert Behavior During Therapy: WFL for tasks assessed/performed Overall Cognitive Status: Impaired/Different from baseline                           Safety/Judgement: Decreased awareness of deficits, Decreased awareness of safety   Problem Solving: Requires verbal cues, Requires tactile cues, Slow processing General Comments: patient is much more interactive today        Exercises      Shoulder Instructions       General Comments      Pertinent Vitals/ Pain       Pain Assessment Pain Assessment: 0-10 Pain Score: 10-Worst pain ever Pain Location: R hip, Pain Descriptors / Indicators: Grimacing, Guarding, Discomfort Pain Intervention(s): Monitored during session, Repositioned  Home Living                                          Prior Functioning/Environment              Frequency  Min 1X/week        Progress Toward Goals  OT Goals(current goals can now be found in the care plan section)  Progress towards OT goals: Progressing toward goals  Acute Rehab OT Goals Patient Stated Goal: go to rehab to get stronger OT Goal Formulation: With patient Time For Goal Achievement: 04/29/23 Potential to Achieve Goals: Good  Plan      Co-evaluation                 AM-PAC OT "6 Clicks" Daily Activity     Outcome Measure   Help from another person eating meals?: A Little Help from another person taking care of personal grooming?: A Little Help from another person toileting, which includes using toliet, bedpan, or urinal?: A Little Help from another person bathing (including washing, rinsing, drying)?: A Lot Help from another person to put on and taking off regular upper body clothing?: A Little Help from another person to put on and taking off regular lower body clothing?: A Lot 6 Click Score: 16    End of  Session Equipment Utilized During Treatment: Rolling walker (2 wheels);Gait belt  OT Visit Diagnosis: Muscle weakness (generalized) (M62.81);Other abnormalities of gait and mobility (R26.89)   Activity Tolerance Patient tolerated treatment well   Patient Left in bed;with call bell/phone within reach;with family/visitor present;with bed alarm set   Nurse Communication Mobility status        Time: 7829-5621 OT Time Calculation (min): 28 min  Charges: OT General Charges $OT Visit: 1 Visit OT Treatments $Self Care/Home Management : 23-37 mins [name], OTR/L  04/20/23, 1:10 PM [name] 04/20/2023, 1:10 PM

## 2023-04-20 NOTE — Plan of Care (Signed)
  Problem: Education: Goal: Ability to describe self-care measures that may prevent or decrease complications (Diabetes Survival Skills Education) will improve Outcome: Progressing Goal: Individualized Educational Video(s) Outcome: Progressing   Problem: Coping: Goal: Ability to adjust to condition or change in health will improve Outcome: Progressing   Problem: Health Behavior/Discharge Planning: Goal: Ability to manage health-related needs will improve Outcome: Progressing   Problem: Education: Goal: Knowledge of General Education information will improve Description: Including pain rating scale, medication(s)/side effects and non-pharmacologic comfort measures Outcome: Progressing   Problem: Clinical Measurements: Goal: Ability to maintain clinical measurements within normal limits will improve Outcome: Progressing Goal: Will remain free from infection Outcome: Progressing Goal: Respiratory complications will improve Outcome: Progressing Goal: Cardiovascular complication will be avoided Outcome: Progressing   Problem: Activity: Goal: Risk for activity intolerance will decrease Outcome: Progressing   Problem: Pain Managment: Goal: General experience of comfort will improve Outcome: Progressing

## 2023-04-20 NOTE — Progress Notes (Signed)
Inpatient Rehab Admissions Coordinator:   Notified by CM that pt and family would like to proceed with rehab at [facility].  I will start insurance auth request today.    Estill Dooms, PT, DPT Admissions Coordinator [phone] 04/20/23  3:19 PM

## 2023-04-20 NOTE — Progress Notes (Signed)
Progress Note   Patient: [name] EPP:[id number] DOB: 1955/11/03 DOA: 04/13/2023     7 DOS: the patient was seen and examined on 04/20/2023   Subjective:  Patient seen and examined in the presence of the family Denies nausea vomiting abdominal pain chest pain or cough According to the family weakness is slowly improving     Brief hospital course: 67 yo female who presented to [facility] ER via EMS on 10/7 following a fall. Per ER notes she was helping a friend and fell backwards. She reports no memory of the fall but there is a question of if she loss consciousness. EMS placed pt in a C-Collar. Following the fall she developed a burning pain in her bilateral upper extremities which is worse on the right. She also had some burning pain in her feet.    Assessment and Plan:   Admitted for Acute Cervical Spinal Cord Injury s/p fall Patient has past medical history of cervical stenosis with neuropathy According to neurosurgery had longstanding pre-existing cervical stenosis puts her at high risks of cervical central cord injury Surgical intervention was discussed with patient and her family and family have decided on waiting for the acute injury to resolve before making final decision Patient initially required vasopressors however currently off plan of care discussed with neurosurgery Monitor neurochecks closely Continue PT OT Continue cervical collar Out of bed as tolerated Continue Foley catheter for urinary retention Continue gabapentin Continue as needed pain management Plan of care discussed with [facility] manager   Acute Fracture of the right superior and inferior pubic rami -Orthopedics consulted, appreciate input ~ recommended weightbearing as tolerated Continue PT/OT Current no current pain control     ACUTE ANEMIA We will transfuse as needed to keep Hb greater than 7 Monitor CBC closely     Diet/type: Regular consistency (see orders) DVT prophylaxis: prophylactic heparin   GI prophylaxis: PPI Lines: Arterial line, and is still needed Foley:  Yes, and it is still needed Code Status:  full code          Physical Exam: General Appearance: elderly female seen and examined at bedside in no acute distress EYES PERRLA, EOM intact.   NECK Supple, No JVD Pulmonary: normal breath sounds, No wheezing.  CardiovascularNormal S1,S2.  No m/r/g.   Abdomen: Benign, Soft, non-tender. Neurology Sleeping, arouses easily to voice, alert and oriented x 4, RUE strength and pain improved,right lower extremities weaker as compared to the left (2-3 out of 5) with pain, positive sensation in all 4 extremities Ext pulses intact, cap refill intact   Data Reviewed: I have reviewed patient's lab results below as well as nursing documentation, TOC manager documentation   Disposition: Status is: Inpatient    Planned Discharge Destination: Pending medical stabilization   Time spent: 40 minutes    Latest Ref Rng & Units 04/20/2023    4:04 AM 04/18/2023    4:21 AM 04/17/2023    3:57 AM  CBC  WBC 4.0 - 10.5 K/uL 5.5  8.9  8.8   Hemoglobin 12.0 - 15.0 g/dL 9.0  9.0  8.7   Hematocrit 36.0 - 46.0 % 27.8  27.4  25.9   Platelets 150 - 400 K/uL 215  219  185        Latest Ref Rng & Units 04/20/2023    4:04 AM 04/19/2023    3:47 AM 04/18/2023    4:21 AM  BMP  Glucose 70 - 99 mg/dL 606  301  601   BUN 8 -  23 mg/dL 51  43  33   Creatinine 0.44 - 1.00 mg/dL 1.61  0.96  0.45   Sodium 135 - 145 mmol/L 142  141  140   Potassium 3.5 - 5.1 mmol/L 4.4  4.7  4.3   Chloride 98 - 111 mmol/L 105  108  106   CO2 22 - 32 mmol/L 24  23  23    Calcium 8.9 - 10.3 mg/dL 8.9  8.5  8.6      Vitals:   04/20/23 0500 04/20/23 0915 04/20/23 1130 04/20/23 1717  BP:  (!) 99/55 (!) 90/57 (!) 128/57  Pulse:  92 98 87  Resp: 17   18  Temp:    98.1 F (36.7 C)  TempSrc:    Oral  SpO2:  98% 98% 100%  Weight:      Height:         Author: Loyce Dys, MD 04/20/2023 5:19 PM  For on call  review [email or web address].

## 2023-04-21 DIAGNOSIS — G9520 Unspecified cord compression: Secondary | ICD-10-CM | POA: Diagnosis not present

## 2023-04-21 LAB — CBC WITH DIFFERENTIAL/PLATELET
Abs Immature Granulocytes: 0.13 10*3/uL — ABNORMAL HIGH (ref 0.00–0.07)
Basophils Absolute: 0 10*3/uL (ref 0.0–0.1)
Basophils Relative: 1 %
Eosinophils Absolute: 0.2 10*3/uL (ref 0.0–0.5)
Eosinophils Relative: 3 %
HCT: 24.8 % — ABNORMAL LOW (ref 36.0–46.0)
Hemoglobin: 8.5 g/dL — ABNORMAL LOW (ref 12.0–15.0)
Immature Granulocytes: 2 %
Lymphocytes Relative: 26 %
Lymphs Abs: 1.9 10*3/uL (ref 0.7–4.0)
MCH: 28.8 pg (ref 26.0–34.0)
MCHC: 34.3 g/dL (ref 30.0–36.0)
MCV: 84.1 fL (ref 80.0–100.0)
Monocytes Absolute: 0.6 10*3/uL (ref 0.1–1.0)
Monocytes Relative: 8 %
Neutro Abs: 4.4 10*3/uL (ref 1.7–7.7)
Neutrophils Relative %: 60 %
Platelets: 211 10*3/uL (ref 150–400)
RBC: 2.95 MIL/uL — ABNORMAL LOW (ref 3.87–5.11)
RDW: 13.7 % (ref 11.5–15.5)
WBC: 7.2 10*3/uL (ref 4.0–10.5)
nRBC: 0 % (ref 0.0–0.2)

## 2023-04-21 LAB — GLUCOSE, CAPILLARY
Glucose-Capillary: 122 mg/dL — ABNORMAL HIGH (ref 70–99)
Glucose-Capillary: 150 mg/dL — ABNORMAL HIGH (ref 70–99)
Glucose-Capillary: 119 mg/dL — ABNORMAL HIGH (ref 70–99)
Glucose-Capillary: 139 mg/dL — ABNORMAL HIGH (ref 70–99)

## 2023-04-21 LAB — BASIC METABOLIC PANEL
Anion gap: 9 (ref 5–15)
BUN: 46 mg/dL — ABNORMAL HIGH (ref 8–23)
CO2: 24 mmol/L (ref 22–32)
Calcium: 8.8 mg/dL — ABNORMAL LOW (ref 8.9–10.3)
Chloride: 100 mmol/L (ref 98–111)
Creatinine, Ser: 1.24 mg/dL — ABNORMAL HIGH (ref 0.44–1.00)
GFR, Estimated: 48 mL/min — ABNORMAL LOW (ref >60)
Glucose, Bld: 117 mg/dL — ABNORMAL HIGH (ref 70–99)
Potassium: 3.9 mmol/L (ref 3.5–5.1)
Sodium: 133 mmol/L — ABNORMAL LOW (ref 135–145)

## 2023-04-21 MED ORDER — SODIUM CHLORIDE 0.9 % IV BOLUS
500.0000 mL | Freq: Once | INTRAVENOUS | Status: AC
  Administered 2023-04-21: 500 mL via INTRAVENOUS

## 2023-04-21 NOTE — Progress Notes (Signed)
Inpatient Rehab Admissions Coordinator:   I did receive approval from [facility] for CIR admit.  Attempted to contact pt but her roommate answered and said she was unavailable.  I was able to speak with her spouse who is in agreement to pursue CIR when bed available.    Estill Dooms, PT, DPT Admissions Coordinator [phone] 04/21/23  11:27 AM

## 2023-04-21 NOTE — Plan of Care (Signed)
  Problem: Education: Goal: Ability to describe self-care measures that may prevent or decrease complications (Diabetes Survival Skills Education) will improve Outcome: Progressing Goal: Individualized Educational Video(s) Outcome: Progressing   Problem: Coping: Goal: Ability to adjust to condition or change in health will improve Outcome: Progressing   Problem: Fluid Volume: Goal: Ability to maintain a balanced intake and output will improve Outcome: Progressing   Problem: Health Behavior/Discharge Planning: Goal: Ability to identify and utilize available resources and services will improve Outcome: Progressing Goal: Ability to manage health-related needs will improve Outcome: Progressing   Problem: Metabolic: Goal: Ability to maintain appropriate glucose levels will improve Outcome: Progressing   Problem: Nutritional: Goal: Maintenance of adequate nutrition will improve Outcome: Progressing Goal: Progress toward achieving an optimal weight will improve Outcome: Progressing   Problem: Skin Integrity: Goal: Risk for impaired skin integrity will decrease Outcome: Progressing   Problem: Tissue Perfusion: Goal: Adequacy of tissue perfusion will improve Outcome: Progressing   Problem: Education: Goal: Knowledge of General Education information will improve Description: Including pain rating scale, medication(s)/side effects and non-pharmacologic comfort measures Outcome: Progressing   Problem: Health Behavior/Discharge Planning: Goal: Ability to manage health-related needs will improve Outcome: Progressing   Problem: Clinical Measurements: Goal: Ability to maintain clinical measurements within normal limits will improve Outcome: Progressing Goal: Will remain free from infection Outcome: Progressing Goal: Diagnostic test results will improve Outcome: Progressing Goal: Respiratory complications will improve Outcome: Progressing Goal: Cardiovascular complication will  be avoided Outcome: Progressing   Problem: Activity: Goal: Risk for activity intolerance will decrease Outcome: Progressing   Problem: Elimination: Goal: Will not experience complications related to bowel motility Outcome: Progressing Goal: Will not experience complications related to urinary retention Outcome: Progressing   Problem: Pain Managment: Goal: General experience of comfort will improve Outcome: Progressing   Problem: Activity: Goal: Ability to perform activities at highest level will improve Outcome: Progressing Goal: Muscle strength will improve Outcome: Progressing   Problem: Skin Integrity: Goal: Risk for impaired skin integrity will decrease Outcome: Progressing   Problem: Coping: Goal: Ability to identify and develop effective coping behavior will improve Outcome: Progressing Goal: Ability to verbalize feelings will improve Outcome: Progressing   Problem: Education: Goal: Knowledge of disease or condition will improve Outcome: Progressing Goal: Knowledge of the prescribed therapeutic regimen will improve Outcome: Progressing   Problem: Self-Care: Goal: Ability to participate in self-care as condition permits will improve Outcome: Progressing   Problem: Skin Integrity: Goal: Risk for impaired skin integrity will decrease Outcome: Progressing

## 2023-04-21 NOTE — Progress Notes (Signed)
Inpatient Rehab Admissions Coordinator:   Insurance auth pending.  Will follow.   Estill Dooms, PT, DPT Admissions Coordinator [phone] 04/21/23  9:12 AM

## 2023-04-21 NOTE — Progress Notes (Signed)
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    PT Comments  Patient supine in bed on arrival and very slow to respond to conversation throughout session. Completed bed mobility with extensive time and minA. Stood from bedside with extensive time and minA. Obtained BP in sitting and standing due to documented low BP in flowsheet, see below. Patient reporting nausea and dizziness upon standing with subsequent low BP obtained. Unsafe to progress mobility this date. Will follow up at later date.   Orthostatic BPs  Sitting 97/64  Standing 78/57  Supine 121/61       If plan is discharge home, recommend the following: A lot of help with walking and/or transfers;A lot of help with bathing/dressing/bathroom;Assist for transportation;Help with stairs or ramp for entrance   Can travel by private vehicle        Equipment Recommendations  Rolling Tommi Crepeau (2 wheels)    Recommendations for Other Services       Precautions / Restrictions Precautions Precautions: Cervical;Fall Precaution Booklet Issued: No Required Braces or Orthoses: Cervical Brace Cervical Brace: Hard collar Restrictions Weight Bearing Restrictions: Yes RLE Weight Bearing: Weight bearing as tolerated     Mobility  Bed Mobility Overal bed mobility: Needs Assistance Bed Mobility: Rolling, Sidelying to Sit, Sit to Sidelying Rolling: Contact guard assist, Used rails Sidelying to sit: Used rails, HOB elevated, Min  assist     Sit to sidelying: Min assist General bed mobility comments: cues for log roll technique. MinA for trunk elevation to EOB. Extensive time required to complete bed mobility. Assist for LEs back into bed at end of session    Transfers Overall transfer level: Needs assistance Equipment used: Rolling Jackey Housey (2 wheels) Transfers: Sit to/from Stand Sit to Stand: Min assist           General transfer comment: assist to stand from low bed surface with use of momentum. Extensive time to complete transfer. BP 78/57 in standing. Patient reporting nausea and dizziness    Ambulation/Gait               General Gait Details: deferred this date due to low BP   Stairs             Wheelchair Mobility     Tilt Bed    Modified Rankin (Stroke Patients Only)       Balance Overall balance assessment: Needs assistance Sitting-balance support: Bilateral upper extremity supported, Feet supported Sitting balance-Leahy Scale: Good     Standing balance support: During functional activity, Reliant on assistive device for balance, Single extremity supported Standing balance-Leahy Scale: Poor                              Cognition Arousal: Alert Behavior During Therapy: WFL for tasks assessed/performed Overall Cognitive Status: Impaired/Different from baseline                           Safety/Judgement: Decreased awareness of deficits, Decreased awareness of safety Awareness:  Emergent Problem Solving: Requires verbal cues, Requires tactile cues, Slow processing General Comments: very slow to process conversation this date        Exercises      General Comments        Pertinent Vitals/Pain Pain Assessment Pain Assessment: Faces Faces Pain Scale: Hurts little more Pain Location: R hip, Pain Descriptors / Indicators: Grimacing, Guarding, Discomfort Pain Intervention(s): Limited activity within patient's tolerance, Monitored during  session, Repositioned    Home Living                          Prior Function            PT Goals (current goals can now be found in the care plan section) Acute Rehab PT Goals PT Goal Formulation: With patient Time For Goal Achievement: 04/29/23 Potential to Achieve Goals: Good Progress towards PT goals: Progressing toward goals    Frequency    Min 1X/week      PT Plan      Co-evaluation              AM-PAC PT "6 Clicks" Mobility   Outcome Measure  Help needed turning from your back to your side while in a flat bed without using bedrails?: A Little Help needed moving from lying on your back to sitting on the side of a flat bed without using bedrails?: A Lot Help needed moving to and from a bed to a chair (including a wheelchair)?: A Little Help needed standing up from a chair using your arms (e.g., wheelchair or bedside chair)?: A Lot Help needed to walk in hospital room?: A Little Help needed climbing 3-5 steps with a railing? : A Lot 6 Click Score: 15    End of Session Equipment Utilized During Treatment: Cervical collar Activity Tolerance: Treatment limited secondary to medical complications (Comment) (low BP) Patient left: in bed;with call bell/phone within reach;with bed alarm set Nurse Communication: Mobility status PT Visit Diagnosis: Unsteadiness on feet (R26.81);Muscle weakness (generalized) (M62.81);Pain;Dizziness and giddiness (R42) Pain - Right/Left: Right Pain - part of body: Arm;Hip     Time: 4540-9811 PT Time Calculation (min) (ACUTE ONLY): 24 min  Charges:    $Therapeutic Activity: 23-37 mins PT General Charges $$ ACUTE PT VISIT: 1 Visit                     Maylon Peppers, PT, DPT Physical Therapist - [facility]  [facility]    Ricca Melgarejo A Tytus Strahle 04/21/2023, 11:19 AM

## 2023-04-21 NOTE — Progress Notes (Signed)
Patient has had hypotension twice today when standing. Orthostatics recorded by PT/OT. MD notified. IVF boluses ordered each time. Patient doesn't feel well when she has these episodes.

## 2023-04-21 NOTE — Progress Notes (Signed)
Occupational Therapy Treatment Patient Details Name: [name] MRN: [id number] DOB: 09/05/1955 Today's Date: 04/21/2023   History of present illness Pt is a 67 year old female presents with the chief complaint of cervical spinal cord injury after a fall, admitted with cervical spinal cord injury central vs hemicord pattern; Acute Fracture of the right superior and inferior pubic rami- WBAT    PMH significant for Arthritis, Blood dyscrasia, Diabetes mellitus without complication (HCC), Diverticulosis, Hypothyroidism, Neuropathy, and Renal insufficiency.   OT comments  Pt is supine in bed on arrival. Easily arousable and agreeable to OT session. She denies pain at rest, but does grimace with movement d/t RLE pain. BP has been low and orthostatic positive with PT this AM. OT to reassess this evening. Pt still feeling bad this afternoon.   Orthostatic Bps:  Supine 117/57 Sitting 102/55 Standing 83/45 Supine 135/55   Min A needed to sit at EOB for trunk management and cueing for log roll technique. C collar donned at EOB. STS from EOB with MIN/CGA, then pt wished to use [facility] for toilet since she did not feel dizzy or nauseous. SPT to [facility] with CGA/MIN A for safety. Max A for hygiene. Pt returned to bed and BP back to [facility] with all needs in place and will cont to require skilled acute OT services to maximize his safety and IND to return to PLOF. Husband present for session and nurse made aware of BP findings. Nurse to send message to MD regarding ongoing BP issues.       If plan is discharge home, recommend the following:  Assistance with feeding;Direct supervision/assist for financial management;Help with stairs or ramp for entrance;Direct supervision/assist for medications management;A little help with walking and/or transfers;A lot of help with bathing/dressing/bathroom   Equipment Recommendations  Other (comment) (defer to next venue)    Recommendations for Other Services       Precautions / Restrictions Precautions Precautions: Cervical;Fall Precaution Booklet Issued: No Required Braces or Orthoses: Cervical Brace Cervical Brace: Hard collar Restrictions Weight Bearing Restrictions: Yes RLE Weight Bearing: Weight bearing as tolerated       Mobility Bed Mobility Overal bed mobility: Needs Assistance     Sidelying to sit: Used rails, HOB elevated, Min assist     Sit to sidelying: Min assist General bed mobility comments: HOB elevated and Min A for trunk assist with cueing for log roll technique and increased time needed. BLE management back to bed    Transfers Overall transfer level: Needs assistance   Transfers: Sit to/from Stand Sit to Stand: Min assist, Contact guard assist     Step pivot transfers: Contact guard assist, Min assist     General transfer comment: no c/o nausea of dizziness just not feeling well, but wanted to get on BSC although BP in standing 83/45     Balance   Sitting-balance support: Bilateral upper extremity supported, Feet supported Sitting balance-Leahy Scale: Good     Standing balance support: During functional activity, Single extremity supported Standing balance-Leahy Scale: Poor Standing balance comment: constant CGA to maintain balance in standing                           ADL either performed or assessed with clinical judgement   ADL                           Toilet Transfer: BSC/3in1;Stand-pivot;Contact guard assist Toilet Transfer Details (  indicate cue type and reason): BSC<>EOB Toileting- Clothing Manipulation and Hygiene: Maximal assistance;Sit to/from stand         General ADL Comments: low BP-orthostatic limiting session, however pt not reporting dizziness just that she didn't feel good    Extremity/Trunk Assessment Upper Extremity Assessment Upper Extremity Assessment: Generalized weakness            Vision       Perception     Praxis      Cognition  Arousal: Alert Behavior During Therapy: WFL for tasks assessed/performed Overall Cognitive Status: Impaired/Different from baseline                           Safety/Judgement: Decreased awareness of deficits, Decreased awareness of safety Awareness: Emergent Problem Solving: Requires verbal cues, Requires tactile cues, Slow processing General Comments: very slow to process conversation this date        Exercises Other Exercises Other Exercises: educated on BUE/BLE HEP to perform in bed as able to maximize overall strength    Shoulder Instructions       General Comments      Pertinent Vitals/ Pain       Pain Assessment Pain Assessment: Faces Faces Pain Scale: Hurts little more Pain Location: R hip, Pain Descriptors / Indicators: Grimacing, Guarding, Discomfort Pain Intervention(s): Monitored during session, Limited activity within patient's tolerance  Home Living                                          Prior Functioning/Environment              Frequency  Min 1X/week        Progress Toward Goals  OT Goals(current goals can now be found in the care plan section)  Progress towards OT goals: Progressing toward goals  Acute Rehab OT Goals Patient Stated Goal: go to rehab to get stronger OT Goal Formulation: With patient Time For Goal Achievement: 04/29/23 Potential to Achieve Goals: Good  Plan      Co-evaluation                 AM-PAC OT "6 Clicks" Daily Activity     Outcome Measure   Help from another person eating meals?: A Little Help from another person taking care of personal grooming?: A Little Help from another person toileting, which includes using toliet, bedpan, or urinal?: A Little Help from another person bathing (including washing, rinsing, drying)?: A Lot Help from another person to put on and taking off regular upper body clothing?: A Little Help from another person to put on and taking off regular  lower body clothing?: A Lot 6 Click Score: 16    End of Session    OT Visit Diagnosis: Muscle weakness (generalized) (M62.81);Other abnormalities of gait and mobility (R26.89)   Activity Tolerance Treatment limited secondary to medical complications (Comment)   Patient Left in bed;with call bell/phone within reach;with family/visitor present;with bed alarm set   Nurse Communication Mobility status        Time: 5621-3086 OT Time Calculation (min): 44 min  Charges: OT General Charges $OT Visit: 1 Visit OT Treatments $Therapeutic Activity: 38-52 mins  Nikol Lemar, OTR/L  04/21/23, 4:26 PM  Liliana Dang E Aubrina Nieman 04/21/2023, 4:22 PM

## 2023-04-21 NOTE — PMR Pre-admission (Signed)
PMR Admission Coordinator Pre-Admission Assessment  Patient: [name] is an 67 y.o., female MRN: [id number] DOB: Mar 04, 1956 Height: 5\' 6"  (167.6 cm) Weight: 61.8 kg  Insurance Information HMO:     PPO: yes     PCP:      IPA:      80/20:      OTHER:  PRIMARY: [employer] Medicare      Policy#: [id number]      Subscriber: pt CM Name: Ladonna Snide      Phone#: [phone]     Fax#: [phone] Pre-Cert#: [id number] authorization for CIR from [place] with [employer] Medicare with updates due to her at fax listed above on 10/21.       Employer:  Benefits:  Phone #: [phone]     Name:  Eff. Date: 03/08/23     Deduct: $0      Out of Pocket Max: $4500 ($14 met)      Life Max: n/a CIR: $295/day for days 1-6      SNF: 20 full day Outpatient:      Co-Pay: $20/visit Home Health: 100%      Co-Pay:  DME: 80%     Co-Pay: 20% Providers:  SECONDARY:       Policy#:      Phone#:   Artist:       Phone#:   The Engineer, materials Information Summary" for patients in Inpatient Rehabilitation Facilities with attached "Privacy Act Statement-Health Care Records" was provided and verbally reviewed with: Patient and Family  Emergency Contact Information Contact Information     Name Relation Home Work Mobile   Nidiffer,Timothy S Spouse [phone]        Other Contacts     Name Relation Home Work Mobile   Sumner Daughter   [phone]       Current Medical History  Patient Admitting Diagnosis: central cord syndrome  History of Present Illness: Pt is a 67 y/o female with PMH of arthritis, BM, diverticulosis, hypothyroidism, neuropathy, and renal insufficiency admitted to [facility] on 10/7 following a backwards fall with cord injury and pelvic fractures.  In ED pt with CBG 61, xray revealed right superior/inferior pubic rami fractures, and MRI showed possible cervical spinal cord injury.  Neurosurgery was consulted, and recommendations were for acute vs delayed surgical intervention and  pt/family opted for the latter.  She was admitted to the ICU for pressor support to maintain MAP greater than 85. Therapy evaluations completed and pt was recommended for CIR>     Patient's medical record from [facility] has been reviewed by the rehabilitation admission coordinator and physician.  Past Medical History  Past Medical History:  Diagnosis Date   Arthritis    Blood dyscrasia    patient stated she is a "free bleeder"   Diabetes mellitus without complication (HCC)    Diverticulosis    Hypothyroidism    Neuropathy    Renal insufficiency     Has the patient had major surgery during 100 days prior to admission? No  Family History   family history includes Breast cancer in her maternal aunt; Diabetes in her father; Hypertension in her father and mother.  Current Medications  Current Facility-Administered Medications:    acetaminophen (TYLENOL) tablet 650 mg, 650 mg, Oral, Q4H PRN, Belia Heman, Kurian, MD   albuterol (PROVENTIL) (2.5 MG/3ML) 0.083% nebulizer solution 2.5 mg, 2.5 mg, Nebulization, Q2H PRN, Acheampong, Genice Rouge, MD   bisacodyl (DULCOLAX) EC tablet 5 mg, 5 mg, Oral, Daily PRN, Foye Deer, RPH, 5 mg at  04/18/23 0548   calcium carbonate (OS-CAL - dosed in mg of elemental calcium) tablet 1,250 mg, 1 tablet, Oral, Q breakfast, Acheampong, Genice Rouge, MD, 1,250 mg at 04/21/23 0900   docusate sodium (COLACE) capsule 100 mg, 100 mg, Oral, BID PRN, Belia Heman, Kurian, MD, 100 mg at 04/18/23 0549   enoxaparin (LOVENOX) injection 40 mg, 40 mg, Subcutaneous, Q24H, Jaynie Bream, RPH, 40 mg at 04/20/23 2125   gabapentin (NEURONTIN) capsule 200 mg, 200 mg, Oral, BID, Acheampong, Genice Rouge, MD, 200 mg at 04/21/23 3329   lactase (LACTAID) tablet 3,000 Units, 3,000 Units, Oral, QAC breakfast, Acheampong, Genice Rouge, MD, 3,000 Units at 04/21/23 0900   levothyroxine (SYNTHROID) tablet 100 mcg, 100 mcg, Oral, QAC breakfast, Jaynie Bream, RPH, 100 mcg at 04/21/23 5188   magnesium citrate  solution 1 Bottle, 1 Bottle, Oral, Once PRN, Acheampong, Genice Rouge, MD   metoCLOPramide (REGLAN) tablet 10 mg, 10 mg, Oral, Q8H PRN, Foye Deer, RPH   multivitamin with minerals tablet 1 tablet, 1 tablet, Oral, Daily, Kasa, Kurian, MD, 1 tablet at 04/21/23 0906   ondansetron (ZOFRAN) injection 4 mg, 4 mg, Intravenous, Q6H PRN, Erin Fulling, MD, 4 mg at 04/18/23 0803   Oral care mouth rinse, 15 mL, Mouth Rinse, PRN, Erin Fulling, MD   oxyCODONE-acetaminophen (PERCOCET/ROXICET) 5-325 MG per tablet 1 tablet, 1 tablet, Oral, Q6H PRN, Loyce Dys, MD, 1 tablet at 04/20/23 2058   oxyCODONE-acetaminophen (PERCOCET/ROXICET) 5-325 MG per tablet 2 tablet, 2 tablet, Oral, Q6H PRN, Ezequiel Essex, NP, 2 tablet at 04/19/23 0841   pantoprazole (PROTONIX) EC tablet 40 mg, 40 mg, Oral, Daily, Jaynie Bream, RPH, 40 mg at 04/21/23 0906   polyethylene glycol (MIRALAX / GLYCOLAX) packet 17 g, 17 g, Oral, Daily PRN, Erin Fulling, MD   psyllium (HYDROCIL/METAMUCIL) 1 packet, 1 packet, Oral, Daily, Acheampong, Genice Rouge, MD, 1 packet at 04/21/23 4166   rosuvastatin (CRESTOR) tablet 5 mg, 5 mg, Oral, Daily, Jaynie Bream, RPH, 5 mg at 04/21/23 0630  Patients Current Diet:  Diet Order             DIET DYS 3 Room service appropriate? Yes with Assist; Fluid consistency: Thin  Diet effective now                   Precautions / Restrictions Precautions Precautions: Cervical, Fall Precaution Booklet Issued: No Precaution Comments: A line removed ~ 2hours prior to session Cervical Brace: Hard collar Restrictions Weight Bearing Restrictions: Yes RLE Weight Bearing: Weight bearing as tolerated Other Position/Activity Restrictions: per orders, ok to donn collar edge of bed, amb to bathroom/shower without c collar   Has the patient had 2 or more falls or a fall with injury in the past year? Yes  Prior Activity Level Community (5-7x/wk): indep prior to admit, driving, working  Prior Functional  Level Self Care: Did the patient need help bathing, dressing, using the toilet or eating? Independent  Indoor Mobility: Did the patient need assistance with walking from room to room (with or without device)? Independent  Stairs: Did the patient need assistance with internal or external stairs (with or without device)? Independent  Functional Cognition: Did the patient need help planning regular tasks such as shopping or remembering to take medications? Independent  Patient Information Are you of Hispanic, Latino/a,or Spanish origin?: A. No, not of Hispanic, Latino/a, or Spanish origin What is your race?: A. White Do you need or want an interpreter to communicate with a doctor  or health care staff?: 0. No  Patient's Response To:  Health Literacy and Transportation Is the patient able to respond to health literacy and transportation needs?: Yes Health Literacy - How often do you need to have someone help you when you read instructions, pamphlets, or other written material from your doctor or pharmacy?: Never In the past 12 months, has lack of transportation kept you from medical appointments or from getting medications?: No In the past 12 months, has lack of transportation kept you from meetings, work, or from getting things needed for daily living?: No  Home Assistive Devices / Equipment Home Equipment: None  Prior Device Use: Indicate devices/aids used by the patient prior to current illness, exacerbation or injury? None of the above  Current Functional Level Cognition  Overall Cognitive Status: Impaired/Different from baseline Difficult to assess due to: Level of arousal Current Attention Level: Focused Orientation Level: Oriented X4 Following Commands: Follows one step commands inconsistently, Follows one step commands with increased time Safety/Judgement: Decreased awareness of deficits, Decreased awareness of safety General Comments: very slow to process conversation this  date    Extremity Assessment (includes Sensation/Coordination)  Upper Extremity Assessment: Right hand dominant, RUE deficits/detail, LUE deficits/detail RUE Deficits / Details: continued noted B grip strength impairments RUE: Unable to fully assess due to pain RUE Sensation: WNL RUE Coordination: decreased fine motor, decreased gross motor LUE Deficits / Details: AROM: shoudler flexion approx 3/4 full AROM, elbow/wrist appear WFL; Pt with reported pain during strength testing, will continue to assess LUE: Unable to fully assess due to pain LUE Sensation: WNL LUE Coordination: decreased fine motor  Lower Extremity Assessment: Defer to PT evaluation, Generalized weakness    ADLs  Overall ADL's : Needs assistance/impaired Eating/Feeding: Maximal assistance, Sitting Grooming: Minimal assistance, Sitting, Wash/dry face, Oral care Grooming Details (indicate cue type and reason): MAX A to brush hair at edge of bed; pt able to bring R hand to mouth/forehad, increased time and frequent vcs for attention to task Upper Body Dressing : Maximal assistance Upper Body Dressing Details (indicate cue type and reason): collar Lower Body Dressing: Maximal assistance Toilet Transfer: Contact guard assist, Ambulation, Rolling walker (2 wheels), BSC/3in1, Cueing for safety, Minimal assistance Toilet Transfer Details (indicate cue type and reason): BSC over top of toilet Toileting- Clothing Manipulation and Hygiene: Contact guard assist, Sit to/from stand Toileting - Clothing Manipulation Details (indicate cue type and reason): foley Functional mobility during ADLs: Contact guard assist, Rolling walker (2 wheels) General ADL Comments: Noted UE continues to be weak during grooming task    Mobility  Overal bed mobility: Needs Assistance Bed Mobility: Rolling, Sidelying to Sit, Sit to Sidelying Rolling: Contact guard assist, Used rails Sidelying to sit: Used rails, HOB elevated, Min assist Supine to sit:  Used rails, HOB elevated, Contact guard Sit to supine: Min assist, HOB elevated, Used rails Sit to sidelying: Min assist General bed mobility comments: cues for log roll technique. MinA for trunk elevation to EOB. Extensive time required to complete bed mobility. Assist for LEs back into bed at end of session    Transfers  Overall transfer level: Needs assistance Equipment used: Rolling walker (2 wheels) Transfers: Sit to/from Stand Sit to Stand: Min assist Bed to/from chair/wheelchair/BSC transfer type:: Step pivot Step pivot transfers: Min assist (extra time) General transfer comment: assist to stand from low bed surface with use of momentum. Extensive time to complete transfer. BP 78/57 in standing. Patient reporting nausea and dizziness    Ambulation / Gait /  Stairs / Psychologist, prison and probation services  Ambulation/Gait Ambulation/Gait assistance: Editor, commissioning (Feet): 18 Feet Assistive device: Rolling walker (2 wheels) Gait Pattern/deviations: Decreased step length - right, Decreased step length - left, Decreased stride length, Decreased dorsiflexion - right, Decreased dorsiflexion - left, Narrow base of support General Gait Details: deferred this date due to low BP Gait velocity: decreased Pre-gait activities: emphasis on upright standing posture, patient is able to take 3 small side steps to the left with 2 person HHA, Mod A. pain with weight bearing on right leg/hip. MAP decreased to 50/60's with standing (no reported dizziness) and nurse titrated levophed for MAP goals during mobility efforts.    Posture / Balance Dynamic Sitting Balance Sitting balance - Comments: able to maintain seated EOB balance with BUE support Balance Overall balance assessment: Needs assistance Sitting-balance support: Bilateral upper extremity supported, Feet supported Sitting balance-Leahy Scale: Good Sitting balance - Comments: able to maintain seated EOB balance with BUE support Standing balance  support: During functional activity, Reliant on assistive device for balance, Single extremity supported Standing balance-Leahy Scale: Poor Standing balance comment: unilateral support/CGA from therapist to perform anterior hygiene in standing    Special needs/care consideration Diabetic management yes   Previous Home Environment (from acute therapy documentation) Living Arrangements: Spouse/significant other, Children Available Help at Discharge: Family Type of Home: Apartment Home Layout: One level Home Access: Level entry Bathroom Shower/Tub: Tub/shower unit Home Care Services: No  Discharge Living Setting Plans for Discharge Living Setting: Patient's home, Lives with (comment) (spouse) Type of Home at Discharge: Apartment Discharge Home Layout: One level Discharge Home Access: Level entry Discharge Bathroom Shower/Tub: Tub/shower unit Discharge Bathroom Toilet: Standard Discharge Bathroom Accessibility: Yes How Accessible: Accessible via walker Does the patient have any problems obtaining your medications?: No  Social/Family/Support Systems Patient Roles: Spouse Anticipated Caregiver: Ahlea Bransky (spouse) Anticipated Caregiver's Contact Information: [phone] Ability/Limitations of Caregiver: none stated, will have help from pt's son and dtr as well for 24/7 support Caregiver Availability: 24/7 Discharge Plan Discussed with Primary Caregiver: Yes Is Caregiver In Agreement with Plan?: Yes Does Caregiver/Family have Issues with Lodging/Transportation while Pt is in Rehab?: No  Goals Patient/Family Goal for Rehab: PT/OT supervision to mod I, SLP n/a Expected length of stay: 12-16 days Additional Information: Discharge plan: return home with pt's spouse and children to provide 24/7 supervision if needed Pt/Family Agrees to Admission and willing to participate: Yes Program Orientation Provided & Reviewed with Pt/Caregiver Including Roles  & Responsibilities: Yes  Barriers  to Discharge: Insurance for SNF coverage, Decreased caregiver support  Barriers to Discharge Comments: spouse and children work and will need to take time off of work if she needs 24/7  Decrease burden of Care through IP rehab admission: not anticipated.   Possible need for SNF placement upon discharge: Not anticipated.  Plan for d/c home with spouse and adult children providing 24/7 support.   Patient Condition: This patient's medical and functional status has changed since the consult dated: 04/17/23 in which the Rehabilitation Physician determined and documented that the patient's condition is appropriate for intensive rehabilitative care in an inpatient rehabilitation facility. See "History of Present Illness" (above) for medical update. Functional changes are: pt min assist for mobility. Patient's medical and functional status update has been discussed with the Rehabilitation physician and patient remains appropriate for inpatient rehabilitation. Will admit to inpatient rehab today.  Preadmission Screen Completed By:  Stephania Fragmin, PT, DPT 04/21/2023 11:58 AM ______________________________________________________________________   Discussed status with Dr. Carlis Abbott  on 04/21/23  at 11:58 AM  and received approval for admission today.  Admission Coordinator:  Stephania Fragmin, PT, DPT time 11:58 AM Dorna Bloom 04/21/23

## 2023-04-21 NOTE — Plan of Care (Signed)
  Problem: Education: Goal: Ability to describe self-care measures that may prevent or decrease complications (Diabetes Survival Skills Education) will improve Outcome: Progressing Goal: Individualized Educational Video(s) Outcome: Progressing   Problem: Coping: Goal: Ability to adjust to condition or change in health will improve Outcome: Progressing   Problem: Fluid Volume: Goal: Ability to maintain a balanced intake and output will improve Outcome: Progressing   Problem: Health Behavior/Discharge Planning: Goal: Ability to identify and utilize available resources and services will improve Outcome: Progressing Goal: Ability to manage health-related needs will improve Outcome: Progressing   Problem: Metabolic: Goal: Ability to maintain appropriate glucose levels will improve Outcome: Progressing   Problem: Nutritional: Goal: Maintenance of adequate nutrition will improve Outcome: Progressing Goal: Progress toward achieving an optimal weight will improve Outcome: Progressing   Problem: Skin Integrity: Goal: Risk for impaired skin integrity will decrease Outcome: Progressing   Problem: Tissue Perfusion: Goal: Adequacy of tissue perfusion will improve Outcome: Progressing   Problem: Education: Goal: Knowledge of General Education information will improve Description: Including pain rating scale, medication(s)/side effects and non-pharmacologic comfort measures Outcome: Progressing   Problem: Nutrition: Goal: Adequate nutrition will be maintained Outcome: Progressing   Problem: Coping: Goal: Level of anxiety will decrease Outcome: Progressing   Problem: Elimination: Goal: Will not experience complications related to bowel motility Outcome: Progressing Goal: Will not experience complications related to urinary retention Outcome: Progressing   Problem: Safety: Goal: Ability to remain free from injury will improve Outcome: Progressing   Problem: Skin  Integrity: Goal: Risk for impaired skin integrity will decrease Outcome: Progressing   Problem: Activity: Goal: Ability to perform activities at highest level will improve Outcome: Progressing Goal: Muscle strength will improve Outcome: Progressing   Problem: Coping: Goal: Ability to identify and develop effective coping behavior will improve Outcome: Progressing Goal: Ability to verbalize feelings will improve Outcome: Progressing   Problem: Urinary Elimination: Goal: Ability to achieve a regular elimination pattern will improve Outcome: Progressing   Problem: Skin Integrity: Goal: Risk for impaired skin integrity will decrease Outcome: Progressing

## 2023-04-21 NOTE — Progress Notes (Signed)
Speech Language Pathology Treatment: Dysphagia  Patient Details Name: [name] MRN: [id number] DOB: [birth year] Today's Date: 04/21/2023 Time: 1010-1020 SLP Time Calculation (min) (ACUTE ONLY): 10 min  Assessment / Plan / Recommendation Clinical Impression  Pt seen for diet tolerance; however, limited in nature due to pt's complaints of nausea. Breakfast tray in room with few bites of eggs, potatoes, sausage taken. Pt denies difficulty consuming mech soft consistencies, but declined trials. Observed with several sips of Pepsi. No overt s/sx pharyngeal dysphagia. Recommend continuation of current diet with x1 additional diet tolerance in 2-3 days. SLP to f/u per POC.   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      SLP Plan  Continue with current plan of care      Recommendations for follow up therapy are one component of a multi-disciplinary discharge planning process, led by the attending physician.  Recommendations may be updated based on patient status, additional functional criteria and insurance authorization.    Recommendations  Diet recommendations: Dysphagia 3 (mechanical soft);Thin liquid Liquids provided via: Teaspoon;Cup;Straw Medication Administration: Whole meds with liquid (vs crushed) Supervision: Patient able to self feed;Staff to assist with self feeding;Intermittent supervision to cue for compensatory strategies Compensations: Minimize environmental distractions;Slow rate;Small sips/bites (single sips) Postural Changes and/or Swallow Maneuvers: Out of bed for meals;Seated upright 90 degrees  (upright 60-90 minutes after meals)                Rehab consult (for debility; GI work up?) Oral care QID   Frequent or constant Supervision/Assistance Dysphagia, oropharyngeal phase (R13.12)     Continue with current plan of care    Clyde Canterbury, M.S., CCC-SLP Speech-Language Pathologist [facility] [phone] Arnette Felts)  [name]  04/21/2023, 10:51 AM

## 2023-04-21 NOTE — Care Management Important Message (Signed)
Important Message  Patient Details  Name: [name] MRN: [id number] Date of Birth: 1955-07-12   Important Message Given:  Yes - Medicare IM     Bernadette Hoit 04/21/2023, 11:39 AM

## 2023-04-21 NOTE — Progress Notes (Signed)
Progress Note   Patient: [name] ZOX:[id number] DOB: [birth year] DOA: 04/13/2023     8 DOS: the patient was seen and examined on 04/21/2023    Subjective:  Patient seen and examined at bedside this morning Denies nausea vomiting abdominal pain chest pain or cough Still continues to work with PT OT     Brief hospital course: 67 yo female who presented to [facility] ER via EMS on 10/7 following a fall. Per ER notes she was helping a friend and fell backwards. She reports no memory of the fall but there is a question of if she loss consciousness. EMS placed pt in a C-Collar. Following the fall she developed a burning pain in her bilateral upper extremities which is worse on the right. She also had some burning pain in her feet.    Assessment and Plan:   Admitted for Acute Cervical Spinal Cord Injury s/p fall Patient has past medical history of cervical stenosis with neuropathy According to neurosurgery had longstanding pre-existing cervical stenosis puts her at high risks of cervical central cord injury Surgical intervention was discussed with patient and her family and family have decided on waiting for the acute injury to resolve before making final decision Patient initially required vasopressors however currently off plan of care discussed with neurosurgery Monitor neurochecks closely Continue PT OT Continue cervical collar Out of bed as tolerated Continue Foley catheter for urinary retention Continue gabapentin Continue as needed pain management Pending placement to rehab [facility] manager on board we appreciate input   Acute Fracture of the right superior and inferior pubic rami -Orthopedics consulted, appreciate input ~ recommended weightbearing as tolerated Continue PT/OT Current no current pain control   Orthostatic hypotension Patient noted to be orthostatic hypotensive today when working with physical therapy Appears volume depleted Given bolus of normal saline today We  will continue to monitor orthostatic vital closely  Normocytic anemia likely secondary to anemia of chronic disease We will transfuse as needed to keep Hb greater than 7 Monitor CBC closely   Hypothyroidism Continue levothyroxine  DVT prophylaxis: prophylactic heparin   GI prophylaxis: PPI   Code Status:  full code     Physical Exam: General Appearance: elderly female seen and examined at bedside in no acute distress EYES PERRLA, EOM intact.   NECK Supple, No JVD Pulmonary: normal breath sounds, No wheezing.  CardiovascularNormal S1,S2.  No m/r/g.   Abdomen: Benign, Soft, non-tender. Neurology Sleeping, arouses easily to voice, alert and oriented x 4, RUE strength and pain improved,right lower extremities weaker as compared to the left (3 out of 5) with pain, positive sensation in all 4 extremities Ext pulses intact, cap refill intact   Data Reviewed: I have reviewed patient's lab reports below as well as nursing documentation, TOC manager documentation  Disposition: Status is: Inpatient    Planned Discharge Destination: Pending medical stabilization   Time spent: 42 minutes      Latest Ref Rng & Units 04/21/2023    4:45 AM 04/20/2023    4:04 AM 04/18/2023    4:21 AM  CBC  WBC 4.0 - 10.5 K/uL 7.2  5.5  8.9   Hemoglobin 12.0 - 15.0 g/dL 8.5  9.0  9.0   Hematocrit 36.0 - 46.0 % 24.8  27.8  27.4   Platelets 150 - 400 K/uL 211  215  219        Latest Ref Rng & Units 04/21/2023    4:45 AM 04/20/2023    4:04 AM 04/19/2023  3:47 AM  BMP  Glucose 70 - 99 mg/dL 914  782  956   BUN 8 - 23 mg/dL 46  51  43   Creatinine 0.44 - 1.00 mg/dL 2.13  0.86  5.78   Sodium 135 - 145 mmol/L 133  142  141   Potassium 3.5 - 5.1 mmol/L 3.9  4.4  4.7   Chloride 98 - 111 mmol/L 100  105  108   CO2 22 - 32 mmol/L 24  24  23    Calcium 8.9 - 10.3 mg/dL 8.8  8.9  8.5     Vitals:   04/21/23 0330 04/21/23 0330 04/21/23 0748 04/21/23 1434  BP:  (!) 112/54 (!) 90/59 (!) 105/46  Pulse:   80 87 87  Resp:  17 18 18   Temp:  98.4 F (36.9 C) 97.8 F (36.6 C)   TempSrc:   Oral   SpO2:  99% 98% 98%  Weight: 61.8 kg     Height:        Author: Loyce Dys, MD 04/21/2023 4:17 PM  For on call review [email or web address].

## 2023-04-22 ENCOUNTER — Inpatient Hospital Stay (HOSPITAL_COMMUNITY)
Admission: AD | Admit: 2023-04-22 | Discharge: 2023-05-12 | DRG: 559 | Disposition: A | Discharge status: Home Health | Payer: Medicare HMO | Source: Other Acute Inpatient Hospital | Attending: Physical Medicine and Rehabilitation | Admitting: Physical Medicine and Rehabilitation

## 2023-04-22 ENCOUNTER — Encounter (HOSPITAL_COMMUNITY): Payer: Self-pay | Admitting: Physical Medicine and Rehabilitation

## 2023-04-22 ENCOUNTER — Other Ambulatory Visit: Payer: Self-pay

## 2023-04-22 DIAGNOSIS — N319 Neuromuscular dysfunction of bladder, unspecified: Secondary | ICD-10-CM | POA: Diagnosis not present

## 2023-04-22 DIAGNOSIS — Z8249 Family history of ischemic heart disease and other diseases of the circulatory system: Secondary | ICD-10-CM

## 2023-04-22 DIAGNOSIS — I951 Orthostatic hypotension: Secondary | ICD-10-CM | POA: Diagnosis present

## 2023-04-22 DIAGNOSIS — K225 Diverticulum of esophagus, acquired: Secondary | ICD-10-CM | POA: Diagnosis present

## 2023-04-22 DIAGNOSIS — R1312 Dysphagia, oropharyngeal phase: Secondary | ICD-10-CM | POA: Diagnosis present

## 2023-04-22 DIAGNOSIS — Z79890 Hormone replacement therapy (postmenopausal): Secondary | ICD-10-CM | POA: Diagnosis not present

## 2023-04-22 DIAGNOSIS — E1142 Type 2 diabetes mellitus with diabetic polyneuropathy: Secondary | ICD-10-CM | POA: Diagnosis present

## 2023-04-22 DIAGNOSIS — R42 Dizziness and giddiness: Secondary | ICD-10-CM | POA: Diagnosis not present

## 2023-04-22 DIAGNOSIS — R32 Unspecified urinary incontinence: Secondary | ICD-10-CM | POA: Diagnosis present

## 2023-04-22 DIAGNOSIS — S14109D Unspecified injury at unspecified level of cervical spinal cord, subsequent encounter: Secondary | ICD-10-CM | POA: Diagnosis not present

## 2023-04-22 DIAGNOSIS — K219 Gastro-esophageal reflux disease without esophagitis: Secondary | ICD-10-CM | POA: Diagnosis present

## 2023-04-22 DIAGNOSIS — S14109A Unspecified injury at unspecified level of cervical spinal cord, initial encounter: Principal | ICD-10-CM | POA: Diagnosis present

## 2023-04-22 DIAGNOSIS — Z794 Long term (current) use of insulin: Secondary | ICD-10-CM

## 2023-04-22 DIAGNOSIS — N183 Chronic kidney disease, stage 3 unspecified: Secondary | ICD-10-CM

## 2023-04-22 DIAGNOSIS — G8252 Quadriplegia, C1-C4 incomplete: Secondary | ICD-10-CM | POA: Diagnosis present

## 2023-04-22 DIAGNOSIS — Z85850 Personal history of malignant neoplasm of thyroid: Secondary | ICD-10-CM

## 2023-04-22 DIAGNOSIS — G8250 Quadriplegia, unspecified: Principal | ICD-10-CM | POA: Diagnosis present

## 2023-04-22 DIAGNOSIS — W1830XD Fall on same level, unspecified, subsequent encounter: Secondary | ICD-10-CM | POA: Diagnosis not present

## 2023-04-22 DIAGNOSIS — E785 Hyperlipidemia, unspecified: Secondary | ICD-10-CM | POA: Diagnosis present

## 2023-04-22 DIAGNOSIS — R519 Headache, unspecified: Secondary | ICD-10-CM | POA: Diagnosis not present

## 2023-04-22 DIAGNOSIS — I700 Atherosclerosis of aorta: Secondary | ICD-10-CM | POA: Diagnosis present

## 2023-04-22 DIAGNOSIS — Z833 Family history of diabetes mellitus: Secondary | ICD-10-CM

## 2023-04-22 DIAGNOSIS — E739 Lactose intolerance, unspecified: Secondary | ICD-10-CM | POA: Diagnosis present

## 2023-04-22 DIAGNOSIS — R1310 Dysphagia, unspecified: Secondary | ICD-10-CM

## 2023-04-22 DIAGNOSIS — K5900 Constipation, unspecified: Secondary | ICD-10-CM

## 2023-04-22 DIAGNOSIS — Z90710 Acquired absence of both cervix and uterus: Secondary | ICD-10-CM

## 2023-04-22 DIAGNOSIS — Z79899 Other long term (current) drug therapy: Secondary | ICD-10-CM | POA: Diagnosis not present

## 2023-04-22 DIAGNOSIS — K592 Neurogenic bowel, not elsewhere classified: Secondary | ICD-10-CM | POA: Diagnosis present

## 2023-04-22 DIAGNOSIS — K222 Esophageal obstruction: Secondary | ICD-10-CM | POA: Diagnosis present

## 2023-04-22 DIAGNOSIS — E890 Postprocedural hypothyroidism: Secondary | ICD-10-CM | POA: Diagnosis present

## 2023-04-22 DIAGNOSIS — E1122 Type 2 diabetes mellitus with diabetic chronic kidney disease: Secondary | ICD-10-CM | POA: Diagnosis present

## 2023-04-22 DIAGNOSIS — N1831 Chronic kidney disease, stage 3a: Secondary | ICD-10-CM | POA: Diagnosis present

## 2023-04-22 DIAGNOSIS — Z803 Family history of malignant neoplasm of breast: Secondary | ICD-10-CM

## 2023-04-22 DIAGNOSIS — S32591D Other specified fracture of right pubis, subsequent encounter for fracture with routine healing: Principal | ICD-10-CM

## 2023-04-22 DIAGNOSIS — G9520 Unspecified cord compression: Secondary | ICD-10-CM | POA: Diagnosis not present

## 2023-04-22 DIAGNOSIS — Y990 Civilian activity done for income or pay: Secondary | ICD-10-CM

## 2023-04-22 DIAGNOSIS — D649 Anemia, unspecified: Secondary | ICD-10-CM | POA: Diagnosis present

## 2023-04-22 DIAGNOSIS — K5909 Other constipation: Secondary | ICD-10-CM | POA: Diagnosis present

## 2023-04-22 DIAGNOSIS — N189 Chronic kidney disease, unspecified: Secondary | ICD-10-CM | POA: Diagnosis not present

## 2023-04-22 DIAGNOSIS — E039 Hypothyroidism, unspecified: Secondary | ICD-10-CM | POA: Diagnosis not present

## 2023-04-22 DIAGNOSIS — M79601 Pain in right arm: Secondary | ICD-10-CM | POA: Diagnosis present

## 2023-04-22 DIAGNOSIS — Z538 Procedure and treatment not carried out for other reasons: Secondary | ICD-10-CM | POA: Diagnosis not present

## 2023-04-22 DIAGNOSIS — Q396 Congenital diverticulum of esophagus: Secondary | ICD-10-CM | POA: Diagnosis not present

## 2023-04-22 LAB — BASIC METABOLIC PANEL
Anion gap: 6 (ref 5–15)
BUN: 37 mg/dL — ABNORMAL HIGH (ref 8–23)
CO2: 24 mmol/L (ref 22–32)
Calcium: 8.4 mg/dL — ABNORMAL LOW (ref 8.9–10.3)
Chloride: 108 mmol/L (ref 98–111)
Creatinine, Ser: 1.28 mg/dL — ABNORMAL HIGH (ref 0.44–1.00)
GFR, Estimated: 46 mL/min — ABNORMAL LOW (ref >60)
Glucose, Bld: 131 mg/dL — ABNORMAL HIGH (ref 70–99)
Potassium: 4.4 mmol/L (ref 3.5–5.1)
Sodium: 138 mmol/L (ref 135–145)

## 2023-04-22 LAB — CBC WITH DIFFERENTIAL/PLATELET
Abs Immature Granulocytes: 0.1 10*3/uL — ABNORMAL HIGH (ref 0.00–0.07)
Basophils Absolute: 0 10*3/uL (ref 0.0–0.1)
Basophils Relative: 0 %
Eosinophils Absolute: 0.1 10*3/uL (ref 0.0–0.5)
Eosinophils Relative: 2 %
HCT: 25.6 % — ABNORMAL LOW (ref 36.0–46.0)
Hemoglobin: 8.3 g/dL — ABNORMAL LOW (ref 12.0–15.0)
Immature Granulocytes: 1 %
Lymphocytes Relative: 15 %
Lymphs Abs: 1 10*3/uL (ref 0.7–4.0)
MCH: 28.2 pg (ref 26.0–34.0)
MCHC: 32.4 g/dL (ref 30.0–36.0)
MCV: 87.1 fL (ref 80.0–100.0)
Monocytes Absolute: 0.6 10*3/uL (ref 0.1–1.0)
Monocytes Relative: 8 %
Neutro Abs: 5.3 10*3/uL (ref 1.7–7.7)
Neutrophils Relative %: 74 %
Platelets: 214 10*3/uL (ref 150–400)
RBC: 2.94 MIL/uL — ABNORMAL LOW (ref 3.87–5.11)
RDW: 13.7 % (ref 11.5–15.5)
WBC: 7.1 10*3/uL (ref 4.0–10.5)
nRBC: 0 % (ref 0.0–0.2)

## 2023-04-22 LAB — GLUCOSE, CAPILLARY
Glucose-Capillary: 115 mg/dL — ABNORMAL HIGH (ref 70–99)
Glucose-Capillary: 163 mg/dL — ABNORMAL HIGH (ref 70–99)
Glucose-Capillary: 148 mg/dL — ABNORMAL HIGH (ref 70–99)
Glucose-Capillary: 168 mg/dL — ABNORMAL HIGH (ref 70–99)

## 2023-04-22 MED ORDER — OXYCODONE-ACETAMINOPHEN 5-325 MG PO TABS
1.0000 | ORAL_TABLET | Freq: Four times a day (QID) | ORAL | Status: DC | PRN
  Administered 2023-04-23 – 2023-04-28 (×4): 1 via ORAL
  Filled 2023-04-22 (×6): qty 1

## 2023-04-22 MED ORDER — ONDANSETRON HCL 4 MG PO TABS
4.0000 mg | ORAL_TABLET | Freq: Four times a day (QID) | ORAL | Status: DC | PRN
  Administered 2023-04-29: 4 mg via ORAL
  Filled 2023-04-22 (×2): qty 1

## 2023-04-22 MED ORDER — BISACODYL 5 MG PO TBEC: 5.0000 mg | DELAYED_RELEASE_TABLET | Freq: Every day | ORAL | Status: DC | PRN

## 2023-04-22 MED ORDER — ALUM & MAG HYDROXIDE-SIMETH 200-200-20 MG/5ML PO SUSP
30.0000 mL | ORAL | Status: DC | PRN
  Administered 2023-04-23 – 2023-05-03 (×3): 30 mL via ORAL
  Filled 2023-04-22 (×3): qty 30

## 2023-04-22 MED ORDER — PSYLLIUM 95 % PO PACK
1.0000 | PACK | Freq: Every day | ORAL | Status: DC
  Administered 2023-04-23 – 2023-05-07 (×7): 1 via ORAL
  Filled 2023-04-22 (×14): qty 1

## 2023-04-22 MED ORDER — LIDOCAINE HCL URETHRAL/MUCOSAL 2 % EX GEL: 1.0000 | CUTANEOUS | Status: DC | PRN

## 2023-04-22 MED ORDER — ENOXAPARIN SODIUM 40 MG/0.4ML IJ SOSY: 40.0000 mg | PREFILLED_SYRINGE | INTRAMUSCULAR | Status: DC

## 2023-04-22 MED ORDER — ORAL CARE MOUTH RINSE: 15.0000 mL | OROMUCOSAL | Status: DC | PRN

## 2023-04-22 MED ORDER — CALCIUM CARBONATE 1250 (500 CA) MG PO TABS
1.0000 | ORAL_TABLET | Freq: Every day | ORAL | Status: DC
  Administered 2023-04-23 – 2023-04-30 (×8): 1250 mg via ORAL
  Filled 2023-04-22 (×8): qty 1

## 2023-04-22 MED ORDER — SORBITOL 70 % SOLN
60.0000 mL | Status: AC
  Administered 2023-04-22: 60 mL via ORAL
  Filled 2023-04-22: qty 60

## 2023-04-22 MED ORDER — ADULT MULTIVITAMIN W/MINERALS CH: 1.0000 | ORAL_TABLET | Freq: Every day | ORAL | Status: DC

## 2023-04-22 MED ORDER — PANTOPRAZOLE SODIUM 40 MG PO TBEC
40.0000 mg | DELAYED_RELEASE_TABLET | Freq: Every day | ORAL | Status: DC
  Administered 2023-04-23 – 2023-05-12 (×17): 40 mg via ORAL
  Filled 2023-04-22 (×18): qty 1

## 2023-04-22 MED ORDER — ACETAMINOPHEN 325 MG PO TABS: 650.0000 mg | ORAL_TABLET | ORAL | Status: DC | PRN

## 2023-04-22 MED ORDER — ALBUTEROL SULFATE (2.5 MG/3ML) 0.083% IN NEBU: 2.5000 mg | INHALATION_SOLUTION | RESPIRATORY_TRACT | Status: DC | PRN

## 2023-04-22 MED ORDER — POLYETHYLENE GLYCOL 3350 17 G PO PACK
17.0000 g | PACK | Freq: Every day | ORAL | Status: DC | PRN
  Administered 2023-04-25: 17 g via ORAL
  Filled 2023-04-22 (×2): qty 1

## 2023-04-22 MED ORDER — DIPHENHYDRAMINE HCL 25 MG PO CAPS: 25.0000 mg | ORAL_CAPSULE | Freq: Four times a day (QID) | ORAL | Status: DC | PRN

## 2023-04-22 MED ORDER — PANTOPRAZOLE SODIUM 40 MG PO TBEC: 40.0000 mg | DELAYED_RELEASE_TABLET | Freq: Every day | ORAL | Status: DC

## 2023-04-22 MED ORDER — ACETAMINOPHEN 325 MG PO TABS
325.0000 mg | ORAL_TABLET | ORAL | Status: DC | PRN
  Administered 2023-04-24 – 2023-04-30 (×4): 650 mg via ORAL
  Filled 2023-04-22 (×4): qty 2

## 2023-04-22 MED ORDER — DOCUSATE SODIUM 100 MG PO CAPS: 100.0000 mg | ORAL_CAPSULE | Freq: Two times a day (BID) | ORAL | Status: DC | PRN

## 2023-04-22 MED ORDER — LACTASE 3000 UNITS PO TABS
3000.0000 [IU] | ORAL_TABLET | Freq: Every day | ORAL | Status: DC
  Administered 2023-04-23 – 2023-05-12 (×19): 3000 [IU] via ORAL
  Filled 2023-04-22 (×20): qty 1

## 2023-04-22 MED ORDER — ONDANSETRON HCL 4 MG/2ML IJ SOLN: 4.0000 mg | Freq: Four times a day (QID) | INTRAMUSCULAR | Status: DC | PRN

## 2023-04-22 MED ORDER — ENOXAPARIN SODIUM 40 MG/0.4ML IJ SOSY
40.0000 mg | PREFILLED_SYRINGE | INTRAMUSCULAR | Status: DC
  Administered 2023-04-22 – 2023-04-29 (×8): 40 mg via SUBCUTANEOUS
  Filled 2023-04-22 (×8): qty 0.4

## 2023-04-22 MED ORDER — METHOCARBAMOL 500 MG PO TABS
500.0000 mg | ORAL_TABLET | Freq: Four times a day (QID) | ORAL | Status: DC | PRN
  Administered 2023-04-22 – 2023-04-30 (×5): 500 mg via ORAL
  Filled 2023-04-22 (×5): qty 1

## 2023-04-22 MED ORDER — GABAPENTIN 100 MG PO CAPS
200.0000 mg | ORAL_CAPSULE | Freq: Two times a day (BID) | ORAL | Status: DC
  Administered 2023-04-22 – 2023-04-28 (×13): 200 mg via ORAL
  Filled 2023-04-22 (×13): qty 2

## 2023-04-22 MED ORDER — LEVOTHYROXINE SODIUM 100 MCG PO TABS
100.0000 ug | ORAL_TABLET | Freq: Every day | ORAL | Status: DC
  Administered 2023-04-23 – 2023-05-12 (×19): 100 ug via ORAL
  Filled 2023-04-22 (×21): qty 1

## 2023-04-22 MED ORDER — SORBITOL 70 % SOLN: 30.0000 mL | Freq: Every day | Status: DC | PRN

## 2023-04-22 MED ORDER — MAGNESIUM CITRATE PO SOLN: 1.0000 | Freq: Once | ORAL | Status: DC | PRN

## 2023-04-22 MED ORDER — GUAIFENESIN-DM 100-10 MG/5ML PO SYRP
10.0000 mL | ORAL_SOLUTION | Freq: Four times a day (QID) | ORAL | Status: DC | PRN
  Administered 2023-04-24 – 2023-04-25 (×2): 10 mL via ORAL
  Filled 2023-04-22 (×2): qty 10

## 2023-04-22 MED ORDER — POLYETHYLENE GLYCOL 3350 17 G PO PACK: 17.0000 g | PACK | Freq: Every day | ORAL | Status: DC | PRN

## 2023-04-22 MED ORDER — MELATONIN 5 MG PO TABS
5.0000 mg | ORAL_TABLET | Freq: Every day | ORAL | Status: DC
  Administered 2023-04-22 – 2023-05-11 (×20): 5 mg via ORAL
  Filled 2023-04-22 (×20): qty 1

## 2023-04-22 MED ORDER — ONDANSETRON HCL 4 MG/2ML IJ SOLN
4.0000 mg | Freq: Four times a day (QID) | INTRAMUSCULAR | Status: DC | PRN
  Administered 2023-04-29: 4 mg via INTRAVENOUS
  Filled 2023-04-22: qty 2

## 2023-04-22 MED ORDER — OXYCODONE-ACETAMINOPHEN 5-325 MG PO TABS
2.0000 | ORAL_TABLET | Freq: Four times a day (QID) | ORAL | Status: DC | PRN
  Administered 2023-04-23: 1 via ORAL
  Filled 2023-04-22: qty 2

## 2023-04-22 MED ORDER — ROSUVASTATIN CALCIUM 5 MG PO TABS
5.0000 mg | ORAL_TABLET | Freq: Every day | ORAL | Status: DC
  Administered 2023-04-23 – 2023-05-12 (×18): 5 mg via ORAL
  Filled 2023-04-22 (×19): qty 1

## 2023-04-22 MED ORDER — FLEET ENEMA RE ENEM
1.0000 | ENEMA | Freq: Once | RECTAL | Status: AC | PRN
  Administered 2023-04-23: 1 via RECTAL
  Filled 2023-04-22: qty 1

## 2023-04-22 MED ORDER — ADULT MULTIVITAMIN W/MINERALS CH
1.0000 | ORAL_TABLET | Freq: Every day | ORAL | Status: DC
  Administered 2023-04-23 – 2023-04-30 (×8): 1 via ORAL
  Filled 2023-04-22 (×8): qty 1

## 2023-04-22 NOTE — H&P (Incomplete)
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  EMS was called and focal collar placed.  She was not aware of loss of consciousness and was amnestic to the fall.  She complained of bilateral burning pain in the upper extremities right worse than left.  Her past medical history is significant for diabetes mellitus, neuropathy, hypothyroidism.  She underwent imaging of the head and neck and neurosurgery was consulted.  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  Per Dr. Katrinka Blazing, given her previous significant cervical stenosis noted on MRI this put her at a high risk for central cord injury with flexion mechanism.  Was admitted to the ICU and [facility] team consulted for admission and for vasopressor to maintain MAP greater than 85 as well as arterial line for blood pressure monitoring.  Raided for hypoglycemia.  Catheter placed due to urinary retention.  Hypotension treated with Levophed.  As per neurosurgery recommendations, she remained on elevated MAP goal for 5 days.  T and OT evaluations carried out.  She was allowed up and out of bed with cervical collar when ambulating.  Full medical management for spinal cord injury continued with close follow-up with neurosurgery.  Goal is relaxed 10/13.  Transferred out of ICU and transferred to medicine service.  Has had multiple episodes of orthostatic hypotension and is received normal saline fluid boluses.  She was started on heparin subcutaneously for DVT prophylaxis.  She is receiving gabapentin and Percocet for pain control.  She is tolerating a regular diet.  Last discussion with neurosurgery is expected waiting to proceed with decompressive surgical intervention.  She will follow-up as outpatient.  Additionally, she underwent  right hip x-rays which revealed acute fractures of the right superior and inferior pubic rami.  Orthopedic surgery was consulted and recommended weight bearing as tolerated.  Per diabetic coordinator, patient has lost approximately 20 lbs over the past 2 months although she has been on [facility] for longer time. Patient has had events of hypoglycemia including morning of 10/07 of CBG 51 which is shown by A1c of 5.9.    SPT to [facility] with CGA/MIN A for safety. Max A for hygiene. The patient requires inpatient medicine and rehabilitation evaluations and services for ongoing dysfunction secondary to spinal cord injury.  Review of Systems  Respiratory:  Negative for shortness of breath.   Cardiovascular:  Negative for chest pain.  Gastrointestinal:  Positive for constipation.  Genitourinary:  Positive for dysuria. Negative for frequency.  Musculoskeletal:  Positive for back pain and joint pain.       Left knee and foot pain>>times months  Neurological:  Positive for weakness.   Past Medical History:  Diagnosis Date   Arthritis    Blood dyscrasia    patient stated she is a "free bleeder"   Diabetes mellitus without complication (HCC)    Diverticulosis    Hypothyroidism    Neuropathy    Renal insufficiency    Past Surgical History:  Procedure Laterality Date   ABDOMINAL HYSTERECTOMY     BILATERAL SALPINGECTOMY Bilateral 03/16/2017   Procedure: BILATERAL SALPINGECTOMY;  Surgeon: Schermerhorn, Ihor Austin, MD;  Location: [facility] ORS;  Service: Gynecology;  Laterality: Bilateral;   COLONOSCOPY WITH PROPOFOL N/A 08/18/2018   Procedure: COLONOSCOPY WITH PROPOFOL;  Surgeon: Mechele Collin,  Wilber Bihari, MD;  Location: [facility] ENDOSCOPY;  Service: Endoscopy;  Laterality: N/A;   CYSTOCELE REPAIR N/A 03/16/2017   Procedure: ANTERIOR REPAIR (CYSTOCELE);  Surgeon: Schermerhorn, Ihor Austin, MD;  Location: [facility] ORS;  Service: Gynecology;  Laterality: N/A;   ESOPHAGOGASTRODUODENOSCOPY (EGD) WITH PROPOFOL N/A 08/18/2018    Procedure: ESOPHAGOGASTRODUODENOSCOPY (EGD) WITH PROPOFOL;  Surgeon: Scot Jun, MD;  Location[facility] ENDOSCOPY;  Service: Endoscopy;  Laterality: N/A;   TEAR DUCT PROBING Right    TUBAL LIGATION     VAGINAL HYSTERECTOMY N/A 03/16/2017   Procedure: HYSTERECTOMY VAGINAL;  Surgeon: Schermerhorn, Ihor Austin, MD;  Location: [facility] ORS;  Service: Gynecology;  Laterality: N/A;   WISDOM TOOTH EXTRACTION     Family History  Problem Relation Age of Onset   Breast cancer Maternal Aunt    Hypertension Mother    Hypertension Father    Diabetes Father    Social History:  reports that she has never smoked. She has never used smokeless tobacco. She reports that she does not drink alcohol and does not use drugs. Allergies:  Allergies  Allergen Reactions   Augmentin [Amoxicillin-Pot Clavulanate] Itching   Ampicillin Rash   Medications Prior to Admission  Medication Sig Dispense Refill   calcium carbonate (OS-CAL - DOSED IN MG OF ELEMENTAL CALCIUM) 1250 (500 Ca) MG tablet Take 1 tablet by mouth daily with breakfast.     cholecalciferol (VITAMIN D3) 25 MCG (1000 UNIT) tablet Take 1,000 Units by mouth daily.     ertugliflozin L-PyroglutamicAc (STEGLATRO) 15 MG TABS tablet Take 15 mg by mouth daily.     gabapentin (NEURONTIN) 100 MG capsule Take 200 mg by mouth 2 (two) times daily.      glipiZIDE (GLUCOTROL) 5 MG tablet Take 5 mg by mouth daily before breakfast.     lactase (LACTAID) 3000 units tablet Take 3,000 Units by mouth daily before breakfast.     levothyroxine (SYNTHROID) 100 MCG tablet Take 100 mcg by mouth daily before breakfast.     omeprazole (PRILOSEC) 40 MG capsule Take 1 capsule (40 mg total) by mouth 2 (two) times daily. Take 30 min after Synthroid and 30 min before breakfast 60 capsule 2   oxybutynin (DITROPAN-XL) 5 MG 24 hr tablet Take 5 mg by mouth at bedtime.     psyllium (REGULOID) 0.52 g capsule Take 0.52 g by mouth daily.     rosuvastatin (CRESTOR) 5 MG tablet Take 5 mg by mouth  daily.     vitamin B-12 (CYANOCOBALAMIN) 100 MCG tablet Take 100 mcg by mouth daily.     insulin glargine (LANTUS) 100 UNIT/ML Solostar Pen Inject 14 Units into the skin at bedtime. (Patient not taking: Reported on 04/14/2023)     metoCLOPramide (REGLAN) 10 MG tablet Take 1 tablet (10 mg total) by mouth every 8 (eight) hours as needed for nausea or vomiting. (Patient not taking: Reported on 04/14/2023) 20 tablet 1   ondansetron (ZOFRAN-ODT) 4 MG disintegrating tablet Take 1 tablet (4 mg total) by mouth every 6 (six) hours as needed for nausea or vomiting. 20 tablet 0   oxyCODONE-acetaminophen (PERCOCET/ROXICET) 5-325 MG tablet Take 2 tablets by mouth every 6 (six) hours as needed. (Patient not taking: Reported on 04/14/2023) 20 tablet 0      Home: Home Living Family/patient expects to be discharged to:: Private residence Living Arrangements: Spouse/significant other, Children Available Help at Discharge: Family Type of Home: Apartment Home Access: Level entry Home Layout: One level Bathroom Shower/Tub: Tub/shower unit Home Equipment: None  Functional History: Prior Function Prior Level of Function : Working/employed, Driving Mobility Comments: Pt reports Independent with mobility and 1 fall last year ADLs Comments: Getting dog today, husaband works fulltime, son works part time, Pt reports independent with all ADL/IADL prior to hospital admission.  Functional Status:  Mobility: Bed Mobility Overal bed mobility: Needs Assistance Bed Mobility: Rolling, Sidelying to Sit, Sit to Sidelying Rolling: Contact guard assist, Used rails Sidelying to sit: Used rails, HOB elevated, Min assist Supine to sit: Used rails, HOB elevated, Contact guard Sit to supine: Min assist, HOB elevated, Used rails Sit to sidelying: Min assist General bed mobility comments: HOB elevated and Min A for trunk assist with cueing for log roll technique and increased time needed. BLE management back to  bed Transfers Overall transfer level: Needs assistance Equipment used: Rolling walker (2 wheels) Transfers: Sit to/from Stand Sit to Stand: Min assist, Contact guard assist Bed to/from chair/wheelchair/BSC transfer type:: Step pivot Step pivot transfers: Contact guard assist, Min assist General transfer comment: no c/o nausea of dizziness just not feeling well, but wanted to get on BSC although BP in standing 83/45 Ambulation/Gait Ambulation/Gait assistance: Min assist Gait Distance (Feet): 18 Feet Assistive device: Rolling walker (2 wheels) Gait Pattern/deviations: Decreased step length - right, Decreased step length - left, Decreased stride length, Decreased dorsiflexion - right, Decreased dorsiflexion - left, Narrow base of support General Gait Details: deferred this date due to low BP Gait velocity: decreased Pre-gait activities: emphasis on upright standing posture, patient is able to take 3 small side steps to the left with 2 person HHA, Mod A. pain with weight bearing on right leg/hip. MAP decreased to 50/60's with standing (no reported dizziness) and nurse titrated levophed for MAP goals during mobility efforts.    ADL: ADL Overall ADL's : Needs assistance/impaired Eating/Feeding: Maximal assistance, Sitting Grooming: Minimal assistance, Sitting, Wash/dry face, Oral care Grooming Details (indicate cue type and reason): MAX A to brush hair at edge of bed; pt able to bring R hand to mouth/forehad, increased time and frequent vcs for attention to task Upper Body Dressing : Maximal assistance Upper Body Dressing Details (indicate cue type and reason): collar Lower Body Dressing: Maximal assistance Toilet Transfer: BSC/3in1, Stand-pivot, Contact guard assist Toilet Transfer Details (indicate cue type and reason): BSC<>EOB Toileting- Clothing Manipulation and Hygiene: Maximal assistance, Sit to/from stand Toileting - Clothing Manipulation Details (indicate cue type and reason):  foley Functional mobility during ADLs: Contact guard assist, Rolling walker (2 wheels) General ADL Comments: low BP-orthostatic limiting session, however pt not reporting dizziness just that she didn't feel good  Cognition: Cognition Overall Cognitive Status: Impaired/Different from baseline Orientation Level: Oriented X4 Cognition Arousal: Alert Behavior During Therapy: WFL for tasks assessed/performed Overall Cognitive Status: Impaired/Different from baseline Area of Impairment: Orientation, Memory, Following commands, Safety/judgement, Awareness, Attention, Problem solving Current Attention Level: Focused Following Commands: Follows one step commands inconsistently, Follows one step commands with increased time Safety/Judgement: Decreased awareness of deficits, Decreased awareness of safety Awareness: Emergent Problem Solving: Requires verbal cues, Requires tactile cues, Slow processing General Comments: very slow to process conversation this date Difficult to assess due to: Level of arousal  Physical Exam: Blood pressure 106/60, pulse 98, temperature 98.2 F (36.8 C), temperature source Oral, resp. rate 18, height 5\' 6"  (1.676 m), weight 61.8 kg, SpO2 96%. Physical Exam  Results for orders placed or performed during the hospital encounter of 04/13/23 (from the past 48 hour(s))  Glucose, capillary     Status: Abnormal  Collection Time: 04/20/23  5:16 PM  Result Value Ref Range   Glucose-Capillary 160 (H) 70 - 99 mg/dL    Comment: Glucose reference range applies only to samples taken after fasting for at least 8 hours.  Glucose, capillary     Status: Abnormal   Collection Time: 04/20/23  9:19 PM  Result Value Ref Range   Glucose-Capillary 133 (H) 70 - 99 mg/dL    Comment: Glucose reference range applies only to samples taken after fasting for at least 8 hours.   Comment 1 Notify RN   CBC with Differential/Platelet     Status: Abnormal   Collection Time: 04/21/23  4:45 AM   Result Value Ref Range   WBC 7.2 4.0 - 10.5 K/uL   RBC 2.95 (L) 3.87 - 5.11 MIL/uL   Hemoglobin 8.5 (L) 12.0 - 15.0 g/dL   HCT 27.0 (L) 35.0 - 09.3 %   MCV 84.1 80.0 - 100.0 fL   MCH 28.8 26.0 - 34.0 pg   MCHC 34.3 30.0 - 36.0 g/dL   RDW 81.8 29.9 - 37.1 %   Platelets 211 150 - 400 K/uL   nRBC 0.0 0.0 - 0.2 %   Neutrophils Relative % 60 %   Neutro Abs 4.4 1.7 - 7.7 K/uL   Lymphocytes Relative 26 %   Lymphs Abs 1.9 0.7 - 4.0 K/uL   Monocytes Relative 8 %   Monocytes Absolute 0.6 0.1 - 1.0 K/uL   Eosinophils Relative 3 %   Eosinophils Absolute 0.2 0.0 - 0.5 K/uL   Basophils Relative 1 %   Basophils Absolute 0.0 0.0 - 0.1 K/uL   Immature Granulocytes 2 %   Abs Immature Granulocytes 0.13 (H) 0.00 - 0.07 K/uL    Comment: Performed at [facility], [address] [place], [place] [address]  Basic metabolic panel     Status: Abnormal   Collection Time: 04/21/23  4:45 AM  Result Value Ref Range   Sodium 133 (L) 135 - 145 mmol/L   Potassium 3.9 3.5 - 5.1 mmol/L   Chloride 100 98 - 111 mmol/L   CO2 24 22 - 32 mmol/L   Glucose, Bld 117 (H) 70 - 99 mg/dL    Comment: Glucose reference range applies only to samples taken after fasting for at least 8 hours.   BUN 46 (H) 8 - 23 mg/dL   Creatinine, Ser 9.38 (H) 0.44 - 1.00 mg/dL   Calcium 8.8 (L) 8.9 - 10.3 mg/dL   GFR, Estimated 48 (L) >60 mL/min    Comment: (NOTE) Calculated using the CKD-EPI Creatinine Equation (2021)    Anion gap 9 5 - 15    Comment: Performed at [facility], [address] [place], [place] [address]  Glucose, capillary     Status: Abnormal   Collection Time: 04/21/23  7:46 AM  Result Value Ref Range   Glucose-Capillary 122 (H) 70 - 99 mg/dL    Comment: Glucose reference range applies only to samples taken after fasting for at least 8 hours.  Glucose, capillary     Status: Abnormal   Collection Time: 04/21/23 11:34 AM  Result Value Ref Range   Glucose-Capillary 150 (H) 70 - 99 mg/dL     Comment: Glucose reference range applies only to samples taken after fasting for at least 8 hours.  Glucose, capillary     Status: Abnormal   Collection Time: 04/21/23  4:47 PM  Result Value Ref Range   Glucose-Capillary 119 (H) 70 - 99  mg/dL    Comment: Glucose reference range applies only to samples taken after fasting for at least 8 hours.  Glucose, capillary     Status: Abnormal   Collection Time: 04/21/23  9:36 PM  Result Value Ref Range   Glucose-Capillary 139 (H) 70 - 99 mg/dL    Comment: Glucose reference range applies only to samples taken after fasting for at least 8 hours.   Comment 1 Notify RN   CBC with Differential/Platelet     Status: Abnormal   Collection Time: 04/22/23  4:44 AM  Result Value Ref Range   WBC 7.1 4.0 - 10.5 K/uL   RBC 2.94 (L) 3.87 - 5.11 MIL/uL   Hemoglobin 8.3 (L) 12.0 - 15.0 g/dL   HCT 16.1 (L) 09.6 - 04.5 %   MCV 87.1 80.0 - 100.0 fL   MCH 28.2 26.0 - 34.0 pg   MCHC 32.4 30.0 - 36.0 g/dL   RDW 40.9 81.1 - 91.4 %   Platelets 214 150 - 400 K/uL   nRBC 0.0 0.0 - 0.2 %   Neutrophils Relative % 74 %   Neutro Abs 5.3 1.7 - 7.7 K/uL   Lymphocytes Relative 15 %   Lymphs Abs 1.0 0.7 - 4.0 K/uL   Monocytes Relative 8 %   Monocytes Absolute 0.6 0.1 - 1.0 K/uL   Eosinophils Relative 2 %   Eosinophils Absolute 0.1 0.0 - 0.5 K/uL   Basophils Relative 0 %   Basophils Absolute 0.0 0.0 - 0.1 K/uL   Immature Granulocytes 1 %   Abs Immature Granulocytes 0.10 (H) 0.00 - 0.07 K/uL    Comment: Performed at [facility], [address] [place], [place] [address]  Basic metabolic panel     Status: Abnormal   Collection Time: 04/22/23  4:44 AM  Result Value Ref Range   Sodium 138 135 - 145 mmol/L   Potassium 4.4 3.5 - 5.1 mmol/L   Chloride 108 98 - 111 mmol/L   CO2 24 22 - 32 mmol/L   Glucose, Bld 131 (H) 70 - 99 mg/dL    Comment: Glucose reference range applies only to samples taken after fasting for at least 8 hours.   BUN 37 (H) 8 - 23 mg/dL    Creatinine, Ser 6.21 (H) 0.44 - 1.00 mg/dL   Calcium 8.4 (L) 8.9 - 10.3 mg/dL   GFR, Estimated 46 (L) >60 mL/min    Comment: (NOTE) Calculated using the CKD-EPI Creatinine Equation (2021)    Anion gap 6 5 - 15    Comment: Performed at [facility], [address] [place], [place] [address]  Glucose, capillary     Status: Abnormal   Collection Time: 04/22/23  7:39 AM  Result Value Ref Range   Glucose-Capillary 115 (H) 70 - 99 mg/dL    Comment: Glucose reference range applies only to samples taken after fasting for at least 8 hours.   No results found.    Blood pressure 106/60, pulse 98, temperature 98.2 F (36.8 C), temperature source Oral, resp. rate 18, height 5\' 6"  (1.676 m), weight 61.8 kg, SpO2 96%.  Medical Problem List and Plan: 1. Functional deficits secondary to ***  -patient may *** shower  -ELOS/Goals: ***  2.  Antithrombotics: -DVT/anticoagulation:  Pharmaceutical: Lovenox  -antiplatelet therapy: none  3. Pain Management: Tylenol, Percocet as needed  -continue gabapentin   4. Mood/Behavior/Sleep: LCSW to evaluate and provide emotional support  -antipsychotic agents: n/a  5. Neuropsych/cognition: This patient is capable of  making decisions on her own behalf.  6. Skin/Wound Care: Routine skin care checks   7. Fluids/Electrolytes/Nutrition: Routine Is and Os and follow-up chemistries  --D3 diet, thin liquids  -lactose intolerant: continue Lactaid  8: Hypothyroidism: continue Synthroid  9: Hyperlipidemia: continue statin  10: Prediabetes: A1c = 5.9% CBGs QID (CBG range 115-150)  -per DC: "Patient may not need any oral meds @ discharge home along with her Mounjaro."   11: Orthostatic hypotension: place TED hose and abd binder if not already done  12: Anemia, chronic: follow-up CBC  13: GI prophylaxis: continue PPI  14: SCI: continue hard collar  -bladder scan/check PVRs  15: Right superior and inferior pubic rami fractures: Weightbearing as  tolerated  16: Right arm pain: X-ray on 10/13 negative  17: Constipation: Give sorbitol 60 cc now>>enema if no results   ***  Milinda Antis, PA-C 04/22/2023

## 2023-04-22 NOTE — TOC Transition Note (Signed)
Transition of Care [facility]) - CM/SW Discharge Note   Patient Details  Name: Brogan England MRN: [id number] Date of Birth: 1955-09-22  [facility] CM/SW Contact:  Chapman Fitch, RN Phone Number: 04/22/2023, 11:03 AM   Clinical Narrative:     Patient will DC [id number] inpatient rehab Anticipated DC date: 04/22/23  Family notified:MD has updated patient and family at bedside Transport by: Molli Hazard  Per MD patient ready for DC to . RN, patient, patient's family, and facility notified of DC. Discharge Summary sent to facility. RN given number for report. DC packet on chart. Caitlin with CIR to arrange carelink transport  TOC signing off.     Barriers to Discharge: Continued Medical Work up   Patient Goals and CMS Choice      Discharge Placement                         Discharge Plan and Services Additional resources added to the After Visit Summary for                                       Social Determinants of Health (SDOH) Interventions SDOH Screenings   Food Insecurity: No Food Insecurity (04/13/2023)  Housing: Low Risk  (04/13/2023)  Transportation Needs: No Transportation Needs (04/13/2023)  Utilities: Not At Risk (04/13/2023)  Tobacco Use: Low Risk  (04/13/2023)     Readmission Risk Interventions     No data to display

## 2023-04-22 NOTE — Discharge Summary (Signed)
Physician Discharge Summary   Patient: [name] MRN: [id number]  DOB: 04-Nov-1955   Admit:     Date of Admission: 04/13/2023 Admitted from: home   Discharge: Date of discharge: 04/22/23 Disposition: Rehabilitation facility Condition at discharge: good  CODE STATUS: FULL CODE      Discharge Physician: Sunnie Nielsen, DO [facility]     PCP: Jerl Mina, MD  Recommendations for Outpatient Follow-up:  Pending course at inpatient rehab  Will need neurosurgery followup    Discharge Instructions     Diet - low sodium heart healthy   Complete by: As directed    Increase activity slowly   Complete by: As directed          Discharge Diagnoses: Principal Problem:   Spinal cord compression, post-traumatic (HCC) Active Problems:   Central cord syndrome (HCC)   Muscle weakness of right upper extremity   Weakness of right lower extremity   Pelvic fracture [facility])       Hospital Course: 67 yo female who presented to [facility] ER via EMS on 10/7 following a fall. Per ER notes she was helping a friend and fell backwards. She reports no memory of the fall but there is a question of if she loss consciousness. EMS placed pt in a C-Collar. Following the fall she developed a burning pain in her bilateral upper extremities which is worse on the right. She also had some burning pain in her feet. Admitted for Acute Cervical Spinal Cord Injury s/p fall,  longstanding pre-existing cervical stenosis puts her at high risks of cervical central cord injury. Surgical intervention was discussed with patient and her family and family have decided on waiting for the acute injury to resolve before making final decision. Rehab placement was arranged, stable for discharge to inpatient rehab    Admitted for Acute Cervical Spinal Cord Injury s/p fall Follow outpatient w/ neurosurgery Continue PT OT Continue cervical collar whe OOB/transporting  Out of bed as tolerated Continue  Foley catheter for urinary retention Continue gabapentin Continue as needed pain management   Acute Fracture of the right superior and inferior pubic rami -Orthopedics consulted, appreciate input ~ recommended weightbearing as tolerated Continue PT/OT Current current pain control   Orthostatic hypotension Improved s/p fluids  monitor orthostatic vital   Normocytic anemia likely secondary to anemia of chronic disease We will transfuse as needed to keep Hb greater than 7 Monitor CBC closely   Hypothyroidism Continue levothyroxine       Discharge Instructions  Allergies as of 04/22/2023       Reactions   Augmentin [amoxicillin-pot Clavulanate] Itching   Ampicillin Rash        Medication List     STOP taking these medications    glipiZIDE 5 MG tablet Commonly known as: GLUCOTROL   insulin glargine 100 UNIT/ML Solostar Pen Commonly known as: LANTUS   omeprazole 40 MG capsule Commonly known as: PRILOSEC       TAKE these medications    acetaminophen 325 MG tablet Commonly known as: TYLENOL Take 2 tablets (650 mg total) by mouth every 4 (four) hours as needed for mild pain (pain score 1-3) (temp > 101.5).   albuterol (2.5 MG/3ML) 0.083% nebulizer solution Commonly known as: PROVENTIL Take 3 mLs (2.5 mg total) by nebulization every 2 (two) hours as needed for wheezing.   bisacodyl 5 MG EC tablet Commonly known as: DULCOLAX Take 1 tablet (5 mg total) by mouth daily as needed for moderate constipation.  calcium carbonate 1250 (500 Ca) MG tablet Commonly known as: OS-CAL - dosed in mg of elemental calcium Take 1 tablet by mouth daily with breakfast.   cholecalciferol 25 MCG (1000 UNIT) tablet Commonly known as: VITAMIN D3 Take 1,000 Units by mouth daily.   docusate sodium 100 MG capsule Commonly known as: COLACE Take 1 capsule (100 mg total) by mouth 2 (two) times daily as needed for mild constipation.   enoxaparin 40 MG/0.4ML injection Commonly  known as: LOVENOX Inject 0.4 mLs (40 mg total) into the skin daily.   ertugliflozin L-PyroglutamicAc 15 MG Tabs tablet Commonly known as: STEGLATRO Take 15 mg by mouth daily.   gabapentin 100 MG capsule Commonly known as: NEURONTIN Take 200 mg by mouth 2 (two) times daily.   lactase 3000 units tablet Commonly known as: LACTAID Take 3,000 Units by mouth daily before breakfast.   levothyroxine 100 MCG tablet Commonly known as: SYNTHROID Take 100 mcg by mouth daily before breakfast.   magnesium citrate Soln Take 296 mLs (1 Bottle total) by mouth once as needed for severe constipation.   metoCLOPramide 10 MG tablet Commonly known as: REGLAN Take 1 tablet (10 mg total) by mouth every 8 (eight) hours as needed for nausea or vomiting.   mouth rinse Liqd solution 15 mLs by Mouth Rinse route as needed (for oral care).   multivitamin with minerals Tabs tablet Take 1 tablet by mouth daily.   ondansetron 4 MG disintegrating tablet Commonly known as: ZOFRAN-ODT Take 1 tablet (4 mg total) by mouth every 6 (six) hours as needed for nausea or vomiting.   ondansetron 4 MG/2ML Soln injection Commonly known as: ZOFRAN Inject 2 mLs (4 mg total) into the vein every 6 (six) hours as needed for nausea.   oxybutynin 5 MG 24 hr tablet Commonly known as: DITROPAN-XL Take 5 mg by mouth at bedtime.   oxyCODONE-acetaminophen 5-325 MG tablet Commonly known as: PERCOCET/ROXICET Take 2 tablets by mouth every 6 (six) hours as needed.   pantoprazole 40 MG tablet Commonly known as: PROTONIX Take 1 tablet (40 mg total) by mouth daily.   polyethylene glycol 17 g packet Commonly known as: MIRALAX / GLYCOLAX Take 17 g by mouth daily as needed for moderate constipation.   psyllium 0.52 g capsule Commonly known as: REGULOID Take 0.52 g by mouth daily.   rosuvastatin 5 MG tablet Commonly known as: CRESTOR Take 5 mg by mouth daily.   vitamin B-12 100 MCG tablet Commonly known as:  CYANOCOBALAMIN Take 100 mcg by mouth daily.         Follow-up Information     [name], MD. Schedule an appointment as soon as possible for a visit.   Specialty: Neurosurgery Why: hospital follow-up with neurosurgeon to discuss spinal decompression - pt going to inpaitnet rehab may benefit from inpatietn consultation at [facility] Contact information: [address] [place] [address] [phone]                 Allergies  Allergen Reactions   Augmentin [Amoxicillin-Pot Clavulanate] Itching   Ampicillin Rash     Subjective: pt reports pain is controlled, no SOB, reprots chronic nagging cough bothering her a bit today no fever/chills, tolerating diet    Discharge Exam: BP 106/60 (BP Location: Right Arm)   Pulse 98   Temp 98.2 F (36.8 C) (Oral)   Resp 18   Ht 5\' 6"  (1.676 m)   Wt 61.8 kg   SpO2 96%  BMI 21.98 kg/m  General: Pt is alert, awake, not in acute distress Cardiovascular: RRR, S1/S2 +, no rubs, no gallops Respiratory: CTA bilaterally, no wheezing, no rhonchi Abdominal: Soft, NT, ND, bowel sounds + Extremities: no edema, no cyanosis. Grip strength equal, moving lower extremities and equal strength aginnt resisted planter/dorsiflexion      The results of significant diagnostics from this hospitalization (including imaging, microbiology, ancillary and laboratory) are listed below for reference.     Microbiology: Recent Results (from the past 240 hour(s))  MRSA Next Gen by PCR, Nasal     Status: None   Collection Time: 04/13/23  7:26 PM   Specimen: Nasal Mucosa; Nasal Swab  Result Value Ref Range Status   MRSA by PCR Next Gen NOT DETECTED NOT DETECTED Final    Comment: (NOTE) The GeneXpert MRSA Assay (FDA approved for NASAL specimens only), is one component of a comprehensive MRSA colonization surveillance program. It is not intended to diagnose MRSA infection nor to guide or monitor treatment for MRSA infections. Test  performance is not FDA approved in patients less than 50 years old. Performed at [facility], [address] [place], [place] [address]      Labs: BNP (last 3 results) No results for input(s): "BNP" in the last 8760 hours. Basic Metabolic Panel: Recent Labs  Lab 04/16/23 0444 04/17/23 0357 04/18/23 0421 04/19/23 0347 04/20/23 0404 04/21/23 0445 04/22/23 0444  NA 139 141 140 141 142 133* 138  K 4.0 4.5 4.3 4.7 4.4 3.9 4.4  CL 107 105 106 108 105 100 108  CO2 21* 18* 23 23 24 24 24   GLUCOSE 119* 106* 137* 116* 136* 117* 131*  BUN 32* 34* 33* 43* 51* 46* 37*  CREATININE 1.41* 1.22* 1.23* 1.44* 1.35* 1.24* 1.28*  CALCIUM 8.7* 8.8* 8.6* 8.5* 8.9 8.8* 8.4*  MG 2.1 2.0  --  2.2  --   --   --   PHOS 4.0 3.7 2.9 3.0  --   --   --    Liver Function Tests: Recent Labs  Lab 04/16/23 0444 04/17/23 0357 04/18/23 0421  ALBUMIN 3.0* 3.2* 3.1*   No results for input(s): "LIPASE", "AMYLASE" in the last 168 hours. No results for input(s): "AMMONIA" in the last 168 hours. CBC: Recent Labs  Lab 04/17/23 0357 04/18/23 0421 04/20/23 0404 04/21/23 0445 04/22/23 0444  WBC 8.8 8.9 5.5 7.2 7.1  NEUTROABS  --   --  3.1 4.4 5.3  HGB 8.7* 9.0* 9.0* 8.5* 8.3*  HCT 25.9* 27.4* 27.8* 24.8* 25.6*  MCV 86.3 87.3 87.4 84.1 87.1  PLT 185 219 215 211 214   Cardiac Enzymes: No results for input(s): "CKTOTAL", "CKMB", "CKMBINDEX", "TROPONINI" in the last 168 hours. BNP: Invalid input(s): "POCBNP" CBG: Recent Labs  Lab 04/21/23 0746 04/21/23 1134 04/21/23 1647 04/21/23 2136 04/22/23 0739  GLUCAP 122* 150* 119* 139* 115*   D-Dimer No results for input(s): "DDIMER" in the last 72 hours. Hgb A1c No results for input(s): "HGBA1C" in the last 72 hours. Lipid Profile No results for input(s): "CHOL", "HDL", "LDLCALC", "TRIG", "CHOLHDL", "LDLDIRECT" in the last 72 hours. Thyroid function studies No results for input(s): "TSH", "T4TOTAL", "T3FREE", "THYROIDAB" in the last 72  hours.  Invalid input(s): "FREET3" Anemia work up No results for input(s): "VITAMINB12", "FOLATE", "FERRITIN", "TIBC", "IRON", "RETICCTPCT" in the last 72 hours. Urinalysis    Component Value Date/Time   COLORURINE YELLOW (A) 04/14/2023 0455   APPEARANCEUR HAZY (A) 04/14/2023 0455   APPEARANCEUR Cloudy  02/19/2013 2252   LABSPEC 1.008 04/14/2023 0455   LABSPEC 1.023 02/19/2013 2252   PHURINE 5.0 04/14/2023 0455   GLUCOSEU >=500 (A) 04/14/2023 0455   GLUCOSEU >=500 02/19/2013 2252   HGBUR LARGE (A) 04/14/2023 0455   BILIRUBINUR NEGATIVE 04/14/2023 0455   BILIRUBINUR Negative 02/19/2013 2252   KETONESUR NEGATIVE 04/14/2023 0455   PROTEINUR NEGATIVE 04/14/2023 0455   NITRITE NEGATIVE 04/14/2023 0455   LEUKOCYTESUR NEGATIVE 04/14/2023 0455   LEUKOCYTESUR 3+ 02/19/2013 2252   Sepsis Labs Recent Labs  Lab 04/18/23 0421 04/20/23 0404 04/21/23 0445 04/22/23 0444  WBC 8.9 5.5 7.2 7.1   Microbiology Recent Results (from the past 240 hour(s))  MRSA Next Gen by PCR, Nasal     Status: None   Collection Time: 04/13/23  7:26 PM   Specimen: Nasal Mucosa; Nasal Swab  Result Value Ref Range Status   MRSA by PCR Next Gen NOT DETECTED NOT DETECTED Final    Comment: (NOTE) The GeneXpert MRSA Assay (FDA approved for NASAL specimens only), is one component of a comprehensive MRSA colonization surveillance program. It is not intended to diagnose MRSA infection nor to guide or monitor treatment for MRSA infections. Test performance is not FDA approved in patients less than 24 years old. Performed at [facility], [address] [place], [place] [address]    Imaging CT THORACIC SPINE WO CONTRAST  Result Date: 04/13/2023 CLINICAL DATA:  Back trauma. EXAM: CT THORACIC AND LUMBAR SPINE WITHOUT CONTRAST TECHNIQUE: Multidetector CT imaging of the thoracic and lumbar spine was performed without contrast. Multiplanar CT image reconstructions were also generated. RADIATION DOSE  REDUCTION: This exam was performed according to the departmental dose-optimization program which includes automated exposure control, adjustment of the mA and/or kV according to patient size and/or use of iterative reconstruction technique. COMPARISON:  None Available. FINDINGS: CT THORACIC SPINE FINDINGS Alignment: Normal. Vertebrae: No acute fracture or focal pathologic process. Paraspinal and other soft tissues: Atherosclerotic calcifications of the thoracic aorta and coronary arteries. Disc levels: No significant disc herniation, spinal canal stenosis or neural foraminal narrowing. CT LUMBAR SPINE FINDINGS Segmentation: Conventional numbering is assumed with 5 non-rib-bearing, lumbar type vertebral bodies. Alignment: Normal. Vertebrae: No acute fracture or focal pathologic process. Paraspinal and other soft tissues: No acute findings. Please refer to same-day CT abdomen/pelvis report for evaluation of the retroperitoneal and intra-abdominal soft tissues. Disc levels: No significant disc herniation, spinal canal stenosis or neural foraminal narrowing. IMPRESSION: 1. No acute fracture or traumatic malalignment of the thoracic or lumbar spine. 2. Coronary artery calcifications. Aortic Atherosclerosis (ICD10-I70.0). Electronically Signed   By: Orvan Falconer M.D.   On: 04/13/2023 20:03   CT L-SPINE NO CHARGE  Result Date: 04/13/2023 CLINICAL DATA:  Back trauma. EXAM: CT THORACIC AND LUMBAR SPINE WITHOUT CONTRAST TECHNIQUE: Multidetector CT imaging of the thoracic and lumbar spine was performed without contrast. Multiplanar CT image reconstructions were also generated. RADIATION DOSE REDUCTION: This exam was performed according to the departmental dose-optimization program which includes automated exposure control, adjustment of the mA and/or kV according to patient size and/or use of iterative reconstruction technique. COMPARISON:  None Available. FINDINGS: CT THORACIC SPINE FINDINGS Alignment: Normal.  Vertebrae: No acute fracture or focal pathologic process. Paraspinal and other soft tissues: Atherosclerotic calcifications of the thoracic aorta and coronary arteries. Disc levels: No significant disc herniation, spinal canal stenosis or neural foraminal narrowing. CT LUMBAR SPINE FINDINGS Segmentation: Conventional numbering is assumed with 5 non-rib-bearing, lumbar type vertebral bodies. Alignment: Normal. Vertebrae: No acute fracture  or focal pathologic process. Paraspinal and other soft tissues: No acute findings. Please refer to same-day CT abdomen/pelvis report for evaluation of the retroperitoneal and intra-abdominal soft tissues. Disc levels: No significant disc herniation, spinal canal stenosis or neural foraminal narrowing. IMPRESSION: 1. No acute fracture or traumatic malalignment of the thoracic or lumbar spine. 2. Coronary artery calcifications. Aortic Atherosclerosis (ICD10-I70.0). Electronically Signed   By: Orvan Falconer M.D.   On: 04/13/2023 20:03   CT ABDOMEN PELVIS WO CONTRAST  Result Date: 04/13/2023 CLINICAL DATA:  Cauda equina syndrome. Cervical spinal cord injury after fall. EXAM: CT ABDOMEN AND PELVIS WITHOUT CONTRAST TECHNIQUE: Multidetector CT imaging of the abdomen and pelvis was performed following the standard protocol without IV contrast. RADIATION DOSE REDUCTION: This exam was performed according to the departmental dose-optimization program which includes automated exposure control, adjustment of the mA and/or kV according to patient size and/or use of iterative reconstruction technique. COMPARISON:  CT abdomen pelvis dated 08/21/2022. FINDINGS: Evaluation of this exam is limited in the absence of intravenous contrast. Lower chest: Minimal left lung base subpleural atelectasis/scarring. There is coronary vascular calcification. No intra-abdominal free air.  Small free fluid in the pelvis. Hepatobiliary: The liver is unremarkable. No biliary dilatation. The gallbladder is  unremarkable. Pancreas: Unremarkable. No pancreatic ductal dilatation or surrounding inflammatory changes. Spleen: Normal in size without focal abnormality. Adrenals/Urinary Tract: The right adrenal glands unremarkable. An 8 mm indeterminate left adrenal nodule. The left kidney is atrophic with dystrophic calcification. There is mild right hydronephrosis, new since the prior CT. There is an extrarenal pelvis with mild pelviectasis on the right. Mild right hydroureter to the level of the urinary bladder. No stone identified. The urinary bladder is unremarkable. Stomach/Bowel: There is sigmoid diverticulosis without active inflammatory changes. There is no bowel obstruction or active inflammation. The appendix is normal. Vascular/Lymphatic: Mild aortoiliac atherosclerotic disease. The IVC is unremarkable. No portal venous gas. There is no adenopathy. Reproductive: Hysterectomy.  No adnexal masses. Other: None Musculoskeletal: Degenerative changes of the spine. Nondisplaced fractures of the right superior and inferior pubic rami. IMPRESSION: 1. Nondisplaced fractures of the right superior and inferior pubic rami. 2. Mild right hydronephrosis and hydroureter, new since the prior CT. No stone. 3. Sigmoid diverticulosis. No bowel obstruction. Normal appendix. 4.  Aortic Atherosclerosis (ICD10-I70.0). Electronically Signed   By: Elgie Collard M.D.   On: 04/13/2023 20:00   MR Cervical Spine Wo Contrast  Result Date: 04/13/2023 CLINICAL DATA:  Neck trauma. Ligamentous injury suspected. Fall with trauma to the head and neck. EXAM: MRI CERVICAL SPINE WITHOUT CONTRAST TECHNIQUE: Multiplanar, multisequence MR imaging of the cervical spine was performed. No intravenous contrast was administered. COMPARISON:  CT earlier same day FINDINGS: Alignment: No malalignment. Vertebrae: No evidence of regional fracture. No sign of anterior or posterior ligamentous injury by MRI. Cord: No sign of acute cord injury. See below regarding  stenosis with chronic cord deformity. Posterior Fossa, vertebral arteries, paraspinal tissues: No traumatic regional finding. Disc levels: Foramen magnum is widely patent. 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 C2-3: Endplate osteophytes and bulging of the disc. Posterior ligamentous prominence. Severe spinal stenosis with AP diameter of the canal in the midline as narrow as 4.7 mm. Effacement of the subarachnoid space and deformity of the cord. Patient would be at risk of compressive myelopathy. Mild bilateral foraminal narrowing. C3-4: Endplate osteophytes and bulging of the disc. Posterior ligamentous prominence. Spinal stenosis with AP diameter of the canal in the midline as  narrow as 4.5 mm. Effacement of the subarachnoid space and deformity of the cord. This would also place the patient at risk of compressive myelopathy. There is moderate bilateral foraminal stenosis, left worse than right, with contributions from facet degeneration. C4-5: Endplate osteophytes and bulging of the disc. Mild posterior ligamentous prominence. Canal narrowing with AP diameter in the midline as narrow as 7 mm. Narrowing of the subarachnoid space with slight indentation of the cord. Bilateral foraminal stenosis. C5-6: Endplate osteophytes and bulging of the disc. Mild posterior ligamentous prominence. Minimal diameter of the canal in the midline 7.8 mm. No cord deformity. No significant foraminal stenosis. C6-7: Bulging of the disc.  No canal or foraminal stenosis. C7-T1: Bulging of the disc.  No canal or foraminal stenosis. IMPRESSION: 1. No acute traumatic finding by MRI. No fracture or evidence of ligamentous injury. 2. Severe spinal stenosis at C2-3 and C3-4 due to endplate osteophytes, bulging of the disc and posterior ligamentous prominence. AP diameter of the canal in the midline as narrow as 4.5- 4.7 mm. Effacement of the subarachnoid space and deformity of the cord.  Patient would be at risk of compressive myelopathy. Bilateral foraminal stenosis also present at these levels. 3. Moderate spinal stenosis at C4-5 and C5-6 due to endplate osteophytes, bulging of the disc and posterior ligamentous prominence. Narrowing of the subarachnoid space with slight indentation of the cord at C4-5. Bilateral foraminal stenosis at C4-5. Electronically Signed   By: Paulina Fusi M.D.   On: 04/13/2023 15:40   DG Hip Unilat With Pelvis 2-3 Views Right  Result Date: 04/13/2023 CLINICAL DATA:  Fall with right shoulder and R hip pain EXAM: DG HIP (WITH OR WITHOUT PELVIS) 2-3V RIGHT COMPARISON:  None Available. FINDINGS: There are acute fractures of right superior and inferior pubic rami. No other acute fracture or dislocation. No aggressive osseous lesion. Visualized sacral arcuate lines are unremarkable. Unremarkable symphysis pubis. There are mild degenerative changes of bilateral hip joints without significant joint space narrowing. Osteophytosis of the superior acetabulum. No radiopaque foreign bodies. IMPRESSION: 1. Acute fractures of the right superior and inferior pubic rami. Electronically Signed   By: Jules Schick M.D.   On: 04/13/2023 14:03   DG Shoulder Right  Result Date: 04/13/2023 CLINICAL DATA:  Fall with right shoulder and R hip pain EXAM: RIGHT SHOULDER - 2+ VIEW COMPARISON:  None Available. FINDINGS: No acute fracture or dislocation. No aggressive osseous lesion. Glenohumeral and acromioclavicular joints are normal in alignment and exhibit mild degenerative changes. No soft tissue swelling. No radiopaque foreign bodies. IMPRESSION: 1. Mild osteoarthritis. No acute fracture or dislocation. Electronically Signed   By: Jules Schick M.D.   On: 04/13/2023 14:00   CT Head Wo Contrast  Result Date: 04/13/2023 CLINICAL DATA:  Head trauma, minor (Age >= 65y); Neck trauma (Age >= 65y) EXAM: CT HEAD WITHOUT CONTRAST CT CERVICAL SPINE WITHOUT CONTRAST TECHNIQUE: Multidetector CT  imaging of the head and cervical spine was performed following the standard protocol without intravenous contrast. Multiplanar CT image reconstructions of the cervical spine were also generated. RADIATION DOSE REDUCTION: This exam was performed according to the departmental dose-optimization program which includes automated exposure control, adjustment of the mA and/or kV according to patient size and/or use of iterative reconstruction technique. COMPARISON:  CT Head and C Spine 03/21/22 FINDINGS: CT HEAD FINDINGS Brain: No hemorrhage. No hydrocephalus. No extra-axial fluid collection. No CT evidence of an acute cortical infarct. Mass effect. No mass lesion. Sequela of mild chronic microvascular ischemic change  with generalized volume loss. Vascular: No hyperdense vessel or unexpected calcification. Skull: Soft swelling along the right parietal scalp. No evidence of an underlying calvarial fracture. Sinuses/Orbits: No middle ear or mastoid effusion. Paranasal sinuses are notable for mucosal thickening in the bilateral ethmoid and sphenoid sinuses. Orbits are unremarkable. Other: None. CT CERVICAL SPINE FINDINGS Alignment: Normal. Skull base and vertebrae: No acute fracture. No primary bone lesion or focal pathologic process. Soft tissues and spinal canal: No prevertebral fluid or swelling. No visible canal hematoma. Disc levels: There are multilevel degenerative changes with calcified disc bulges and ligamentum flavum hypertrophy that results in moderate to severe spinal canal narrowing at C2-C3 and at least moderate spinal canal narrowing at C3-C4. If with the patient has new onset myelopathic symptoms, further evaluation with a cervical spine MRI is recommended. Upper chest: Negative. Other: Status post thyroidectomy. IMPRESSION: 1. No acute intracranial abnormality. 2. Soft tissue swelling along the right parietal scalp. No evidence of an underlying calvarial fracture. 3. No acute fracture or traumatic  malalignment of the cervical spine. 4. Multilevel degenerative changes with calcified disc bulges and ligamentum flavum hypertrophy that results in moderate to severe spinal canal narrowing at C2-C3 and at least moderate spinal canal narrowing at C3-C4. If the patient has new onset myelopathic symptoms, further evaluation with a cervical spine MRI is recommended. Electronically Signed   By: Lorenza Cambridge M.D.   On: 04/13/2023 12:49   CT Cervical Spine Wo Contrast  Result Date: 04/13/2023 CLINICAL DATA:  Head trauma, minor (Age >= 65y); Neck trauma (Age >= 65y) EXAM: CT HEAD WITHOUT CONTRAST CT CERVICAL SPINE WITHOUT CONTRAST TECHNIQUE: Multidetector CT imaging of the head and cervical spine was performed following the standard protocol without intravenous contrast. Multiplanar CT image reconstructions of the cervical spine were also generated. RADIATION DOSE REDUCTION: This exam was performed according to the departmental dose-optimization program which includes automated exposure control, adjustment of the mA and/or kV according to patient size and/or use of iterative reconstruction technique. COMPARISON:  CT Head and C Spine 03/21/22 FINDINGS: CT HEAD FINDINGS Brain: No hemorrhage. No hydrocephalus. No extra-axial fluid collection. No CT evidence of an acute cortical infarct. Mass effect. No mass lesion. Sequela of mild chronic microvascular ischemic change with generalized volume loss. Vascular: No hyperdense vessel or unexpected calcification. Skull: Soft swelling along the right parietal scalp. No evidence of an underlying calvarial fracture. Sinuses/Orbits: No middle ear or mastoid effusion. Paranasal sinuses are notable for mucosal thickening in the bilateral ethmoid and sphenoid sinuses. Orbits are unremarkable. Other: None. CT CERVICAL SPINE FINDINGS Alignment: Normal. Skull base and vertebrae: No acute fracture. No primary bone lesion or focal pathologic process. Soft tissues and spinal canal: No  prevertebral fluid or swelling. No visible canal hematoma. Disc levels: There are multilevel degenerative changes with calcified disc bulges and ligamentum flavum hypertrophy that results in moderate to severe spinal canal narrowing at C2-C3 and at least moderate spinal canal narrowing at C3-C4. If with the patient has new onset myelopathic symptoms, further evaluation with a cervical spine MRI is recommended. Upper chest: Negative. Other: Status post thyroidectomy. IMPRESSION: 1. No acute intracranial abnormality. 2. Soft tissue swelling along the right parietal scalp. No evidence of an underlying calvarial fracture. 3. No acute fracture or traumatic malalignment of the cervical spine. 4. Multilevel degenerative changes with calcified disc bulges and ligamentum flavum hypertrophy that results in moderate to severe spinal canal narrowing at C2-C3 and at least moderate spinal canal narrowing at C3-C4. If the patient  has new onset myelopathic symptoms, further evaluation with a cervical spine MRI is recommended. Electronically Signed   By: Lorenza Cambridge M.D.   On: 04/13/2023 12:49      Time coordinating discharge: over 30 minutes  SIGNED:  Sunnie Nielsen DO [facility]

## 2023-04-22 NOTE — Progress Notes (Signed)
   04/22/23 1500  Spiritual Encounters  Type of Visit Initial  Care provided to: Patient  Conversation partners present during encounter Nurse  Referral source Patient request;Family  Reason for visit Advance directives  OnCall Visit No   Ch responded to request for AD. Pt's family present at bedside. Ch assisted pt with AD education. Pt will page Ch when AD is ready. Ch remains available when needed.

## 2023-04-22 NOTE — Progress Notes (Signed)
PMR Admission Coordinator Pre-Admission Assessment   Patient: [name] is an 67 y.o., female MRN: [id number] DOB: 1955/07/31 Height: 5\' 6"  (167.6 cm) Weight: 61.8 kg   Insurance Information HMO:     PPO: yes     PCP:      IPA:      80/20:      OTHER:  PRIMARY: [employer] Medicare      Policy#: [id number]      Subscriber: pt CM Name: Ladonna Snide      Phone#: [phone]     Fax#: [phone] Pre-Cert#: [id number] authorization for CIR from [place] with [employer] Medicare with updates due to her at fax listed above on 10/21.       Employer:  Benefits:  Phone #: [phone]     Name:  Eff. Date: 03/08/23     Deduct: $0      Out of Pocket Max: $4500 ($14 met)      Life Max: n/a CIR: $295/day for days 1-6      SNF: 20 full day Outpatient:      Co-Pay: $20/visit Home Health: 100%      Co-Pay:  DME: 80%     Co-Pay: 20% Providers:  SECONDARY:       Policy#:      Phone#:    Artist:       Phone#:    The Engineer, materials Information Summary" for patients in Inpatient Rehabilitation Facilities with attached "Privacy Act Statement-Health Care Records" was provided and verbally reviewed with: Patient and Family   Emergency Contact Information Contact Information       Name Relation Home Work Mobile    Spinola,Timothy S Spouse [phone]             Other Contacts       Name Relation Home Work Mobile    [place] Daughter     [phone]           Current Medical History  Patient Admitting Diagnosis: central cord syndrome   History of Present Illness: Pt is a 67 y/o female with PMH of arthritis, BM, diverticulosis, hypothyroidism, neuropathy, and renal insufficiency admitted to [facility] on 10/7 following a backwards fall with cord injury and pelvic fractures.  In ED pt with CBG 61, xray revealed right superior/inferior pubic rami fractures, and MRI showed possible cervical spinal cord injury with severe stenosis at C2-4 due to endplate osteophytes and disc bulging.  AP  diameter of the canal in the middle as narrow as 4.5 mm.  Neurosurgery was consulted, and recommendations were for acute vs delayed surgical intervention and pt/family opted for the latter.  She was admitted to the ICU for pressor support to maintain MAP greater than 85 for 48 hours. Hospital course complicated by orthostatic hypotension, treated with IV hydration. Therapy evaluations completed and pt was recommended for CIR.   Patient[facility] has been reviewed by the rehabilitation admission coordinator and physician.   Past Medical History      Past Medical History:  Diagnosis Date   Arthritis     Blood dyscrasia      patient stated she is a "free bleeder"   Diabetes mellitus without complication (HCC)     Diverticulosis     Hypothyroidism     Neuropathy     Renal insufficiency            Has the patient had major surgery during 100 days prior to admission? No   Family History   family history  includes Breast cancer in her maternal aunt; Diabetes in her father; Hypertension in her father and mother.   Current Medications  Current Medications    Current Facility-Administered Medications:    acetaminophen (TYLENOL) tablet 650 mg, 650 mg, Oral, Q4H PRN, Belia Heman, Kurian, MD   albuterol (PROVENTIL) (2.5 MG/3ML) 0.083% nebulizer solution 2.5 mg, 2.5 mg, Nebulization, Q2H PRN, Acheampong, Genice Rouge, MD   bisacodyl (DULCOLAX) EC tablet 5 mg, 5 mg, Oral, Daily PRN, Foye Deer, RPH, 5 mg at 04/18/23 0548   calcium carbonate (OS-CAL - dosed in mg of elemental calcium) tablet 1,250 mg, 1 tablet, Oral, Q breakfast, Acheampong, Genice Rouge, MD, 1,250 mg at 04/21/23 0900   docusate sodium (COLACE) capsule 100 mg, 100 mg, Oral, BID PRN, Belia Heman, Kurian, MD, 100 mg at 04/18/23 0549   enoxaparin (LOVENOX) injection 40 mg, 40 mg, Subcutaneous, Q24H, Jaynie Bream, RPH, 40 mg at 04/20/23 2125   gabapentin (NEURONTIN) capsule 200 mg, 200 mg, Oral, BID, Acheampong, Genice Rouge, MD, 200 mg  at 04/21/23 4401   lactase (LACTAID) tablet 3,000 Units, 3,000 Units, Oral, QAC breakfast, Acheampong, Genice Rouge, MD, 3,000 Units at 04/21/23 0900   levothyroxine (SYNTHROID) tablet 100 mcg, 100 mcg, Oral, QAC breakfast, Jaynie Bream, RPH, 100 mcg at 04/21/23 0272   magnesium citrate solution 1 Bottle, 1 Bottle, Oral, Once PRN, Acheampong, Genice Rouge, MD   metoCLOPramide (REGLAN) tablet 10 mg, 10 mg, Oral, Q8H PRN, Foye Deer, RPH   multivitamin with minerals tablet 1 tablet, 1 tablet, Oral, Daily, Kasa, Kurian, MD, 1 tablet at 04/21/23 0906   ondansetron (ZOFRAN) injection 4 mg, 4 mg, Intravenous, Q6H PRN, Erin Fulling, MD, 4 mg at 04/18/23 0803   Oral care mouth rinse, 15 mL, Mouth Rinse, PRN, Erin Fulling, MD   oxyCODONE-acetaminophen (PERCOCET/ROXICET) 5-325 MG per tablet 1 tablet, 1 tablet, Oral, Q6H PRN, Loyce Dys, MD, 1 tablet at 04/20/23 2058   oxyCODONE-acetaminophen (PERCOCET/ROXICET) 5-325 MG per tablet 2 tablet, 2 tablet, Oral, Q6H PRN, Ezequiel Essex, NP, 2 tablet at 04/19/23 0841   pantoprazole (PROTONIX) EC tablet 40 mg, 40 mg, Oral, Daily, Jaynie Bream, RPH, 40 mg at 04/21/23 0906   polyethylene glycol (MIRALAX / GLYCOLAX) packet 17 g, 17 g, Oral, Daily PRN, Erin Fulling, MD   psyllium (HYDROCIL/METAMUCIL) 1 packet, 1 packet, Oral, Daily, Acheampong, Genice Rouge, MD, 1 packet at 04/21/23 5366   rosuvastatin (CRESTOR) tablet 5 mg, 5 mg, Oral, Daily, Jaynie Bream, RPH, 5 mg at 04/21/23 4403     Patients Current Diet:  Diet Order                  DIET DYS 3 Room service appropriate? Yes with Assist; Fluid consistency: Thin  Diet effective now                         Precautions / Restrictions Precautions Precautions: Cervical, Fall Precaution Booklet Issued: No Precaution Comments: A line removed ~ 2hours prior to session Cervical Brace: Hard collar Restrictions Weight Bearing Restrictions: Yes RLE Weight Bearing: Weight bearing as tolerated Other  Position/Activity Restrictions: per orders, ok to donn collar edge of bed, amb to bathroom/shower without c collar    Has the patient had 2 or more falls or a fall with injury in the past year? Yes   Prior Activity Level Community (5-7x/wk): indep prior to admit, driving, working   Prior Functional Level Self Care: Did the patient  need help bathing, dressing, using the toilet or eating? Independent   Indoor Mobility: Did the patient need assistance with walking from room to room (with or without device)? Independent   Stairs: Did the patient need assistance with internal or external stairs (with or without device)? Independent   Functional Cognition: Did the patient need help planning regular tasks such as shopping or remembering to take medications? Independent   Patient Information Are you of Hispanic, Latino/a,or Spanish origin?: A. No, not of Hispanic, Latino/a, or Spanish origin What is your race?: A. White Do you need or want an interpreter to communicate with a doctor or health care staff?: 0. No   Patient's Response To:  Health Literacy and Transportation Is the patient able to respond to health literacy and transportation needs?: Yes Health Literacy - How often do you need to have someone help you when you read instructions, pamphlets, or other written material from your doctor or pharmacy?: Never In the past 12 months, has lack of transportation kept you from medical appointments or from getting medications?: No In the past 12 months, has lack of transportation kept you from meetings, work, or from getting things needed for daily living?: No   Home Assistive Devices / Equipment Home Equipment: None   Prior Device Use: Indicate devices/aids used by the patient prior to current illness, exacerbation or injury? None of the above   Current Functional Level Cognition   Overall Cognitive Status: Impaired/Different from baseline Difficult to assess due to: Level of  arousal Current Attention Level: Focused Orientation Level: Oriented X4 Following Commands: Follows one step commands inconsistently, Follows one step commands with increased time Safety/Judgement: Decreased awareness of deficits, Decreased awareness of safety General Comments: very slow to process conversation this date    Extremity Assessment (includes Sensation/Coordination)   Upper Extremity Assessment: Right hand dominant, RUE deficits/detail, LUE deficits/detail RUE Deficits / Details: continued noted B grip strength impairments RUE: Unable to fully assess due to pain RUE Sensation: WNL RUE Coordination: decreased fine motor, decreased gross motor LUE Deficits / Details: AROM: shoudler flexion approx 3/4 full AROM, elbow/wrist appear WFL; Pt with reported pain during strength testing, will continue to assess LUE: Unable to fully assess due to pain LUE Sensation: WNL LUE Coordination: decreased fine motor  Lower Extremity Assessment: Defer to PT evaluation, Generalized weakness     ADLs   Overall ADL's : Needs assistance/impaired Eating/Feeding: Maximal assistance, Sitting Grooming: Minimal assistance, Sitting, Wash/dry face, Oral care Grooming Details (indicate cue type and reason): MAX A to brush hair at edge of bed; pt able to bring R hand to mouth/forehad, increased time and frequent vcs for attention to task Upper Body Dressing : Maximal assistance Upper Body Dressing Details (indicate cue type and reason): collar Lower Body Dressing: Maximal assistance Toilet Transfer: Contact guard assist, Ambulation, Rolling walker (2 wheels), BSC/3in1, Cueing for safety, Minimal assistance Toilet Transfer Details (indicate cue type and reason): BSC over top of toilet Toileting- Clothing Manipulation and Hygiene: Contact guard assist, Sit to/from stand Toileting - Clothing Manipulation Details (indicate cue type and reason): foley Functional mobility during ADLs: Contact guard assist,  Rolling walker (2 wheels) General ADL Comments: Noted UE continues to be weak during grooming task     Mobility   Overal bed mobility: Needs Assistance Bed Mobility: Rolling, Sidelying to Sit, Sit to Sidelying Rolling: Contact guard assist, Used rails Sidelying to sit: Used rails, HOB elevated, Min assist Supine to sit: Used rails, HOB elevated, Contact guard Sit to  supine: Min assist, HOB elevated, Used rails Sit to sidelying: Min assist General bed mobility comments: cues for log roll technique. MinA for trunk elevation to EOB. Extensive time required to complete bed mobility. Assist for LEs back into bed at end of session     Transfers   Overall transfer level: Needs assistance Equipment used: Rolling walker (2 wheels) Transfers: Sit to/from Stand Sit to Stand: Min assist Bed to/from chair/wheelchair/BSC transfer type:: Step pivot Step pivot transfers: Min assist (extra time) General transfer comment: assist to stand from low bed surface with use of momentum. Extensive time to complete transfer. BP 78/57 in standing. Patient reporting nausea and dizziness     Ambulation / Gait / Stairs / Wheelchair Mobility   Ambulation/Gait Ambulation/Gait assistance: Editor, commissioning (Feet): 18 Feet Assistive device: Rolling walker (2 wheels) Gait Pattern/deviations: Decreased step length - right, Decreased step length - left, Decreased stride length, Decreased dorsiflexion - right, Decreased dorsiflexion - left, Narrow base of support General Gait Details: deferred this date due to low BP Gait velocity: decreased Pre-gait activities: emphasis on upright standing posture, patient is able to take 3 small side steps to the left with 2 person HHA, Mod A. pain with weight bearing on right leg/hip. MAP decreased to 50/60's with standing (no reported dizziness) and nurse titrated levophed for MAP goals during mobility efforts.     Posture / Balance Dynamic Sitting Balance Sitting balance -  Comments: able to maintain seated EOB balance with BUE support Balance Overall balance assessment: Needs assistance Sitting-balance support: Bilateral upper extremity supported, Feet supported Sitting balance-Leahy Scale: Good Sitting balance - Comments: able to maintain seated EOB balance with BUE support Standing balance support: During functional activity, Reliant on assistive device for balance, Single extremity supported Standing balance-Leahy Scale: Poor Standing balance comment: unilateral support/CGA from therapist to perform anterior hygiene in standing     Special needs/care consideration Diabetic management yes    Previous Home Environment (from acute therapy documentation) Living Arrangements: Spouse/significant other, Children Available Help at Discharge: Family Type of Home: Apartment Home Layout: One level Home Access: Level entry Bathroom Shower/Tub: Tub/shower unit Home Care Services: No   Discharge Living Setting Plans for Discharge Living Setting: Patient's home, Lives with (comment) (spouse) Type of Home at Discharge: Apartment Discharge Home Layout: One level Discharge Home Access: Level entry Discharge Bathroom Shower/Tub: Tub/shower unit Discharge Bathroom Toilet: Standard Discharge Bathroom Accessibility: Yes How Accessible: Accessible via walker Does the patient have any problems obtaining your medications?: No   Social/Family/Support Systems Patient Roles: Spouse Anticipated Caregiver: Skyanna Blehm (spouse) Anticipated Caregiver's Contact Information: [phone] Ability/Limitations of Caregiver: none stated, will have help from pt's son and dtr as well for 24/7 support Caregiver Availability: 24/7 Discharge Plan Discussed with Primary Caregiver: Yes Is Caregiver In Agreement with Plan?: Yes Does Caregiver/Family have Issues with Lodging/Transportation while Pt is in Rehab?: No   Goals Patient/Family Goal for Rehab: PT/OT supervision to mod I, SLP  n/a Expected length of stay: 12-16 days Additional Information: Discharge plan: return home with pt's spouse and children to provide 24/7 supervision if needed Pt/Family Agrees to Admission and willing to participate: Yes Program Orientation Provided & Reviewed with Pt/Caregiver Including Roles  & Responsibilities: Yes  Barriers to Discharge: Insurance for SNF coverage, Decreased caregiver support  Barriers to Discharge Comments: spouse and children work and will need to take time off of work if she needs 24/7   Decrease burden of Care through IP rehab admission: not anticipated.  Possible need for SNF placement upon discharge: Not anticipated.  Plan for d/c home with spouse and adult children providing 24/7 support.    Patient Condition: This patient's medical and functional status has changed since the consult dated: 04/17/23 in which the Rehabilitation Physician determined and documented that the patient's condition is appropriate for intensive rehabilitative care in an inpatient rehabilitation facility. See "History of Present Illness" (above) for medical update. Functional changes are: pt min assist for mobility. Patient's medical and functional status update has been discussed with the Rehabilitation physician and patient remains appropriate for inpatient rehabilitation. Will admit to inpatient rehab today.   Preadmission Screen Completed By:  Stephania Fragmin, PT, DPT 04/21/2023 11:58 AM ______________________________________________________________________   Discussed status with Dr. Carlis Abbott on 04/21/23  at 11:58 AM  and received approval for admission today.   Admission Coordinator:  Stephania Fragmin, PT, DPT time 11:58 AM Dorna Bloom 04/21/23

## 2023-04-22 NOTE — Progress Notes (Signed)
Inpatient Rehabilitation Admission Medication Review by a Pharmacist  A complete drug regimen review was completed for this patient to identify any potential clinically significant medication issues.  High Risk Drug Classes Is patient taking? Indication by Medication  Antipsychotic No   Anticoagulant Yes Lovenox- vte ppx  Antibiotic No   Opioid Yes Percocet- acute pain  Antiplatelet No   Hypoglycemics/insulin No   Vasoactive Medication No   Chemotherapy No   Other Yes Benadryl- itching Melatonin- sleep Robaxin- muscle spasms Neurontin- neuropathic pain Synthroid- hypothyroidism Protonix- GERD Crestor- HLD     Type of Medication Issue Identified Description of Issue Recommendation(s)  Drug Interaction(s) (clinically significant)     Duplicate Therapy     Allergy     No Medication Administration End Date     Incorrect Dose     Additional Drug Therapy Needed     Significant med changes from prior encounter (inform family/care partners about these prior to discharge).    Other  PTA meds: Steglatro Glipizide Lantus Prilosec Ditropan-XL Restart PTA meds when and if necessary during CIR admission or at time of discharge, if warranted     Clinically significant medication issues were identified that warrant physician communication and completion of prescribed/recommended actions by midnight of the next day:  No    Time spent performing this drug regimen review (minutes):  30   Derrall Hicks BS, PharmD, BCPS Clinical Pharmacist 04/22/2023 12:46 PM  Contact: [phone] after 3 PM  "Be curious, not judgmental..." -Debbora Dus

## 2023-04-22 NOTE — H&P (Addendum)
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  EMS was called and focal collar placed.  She was not aware of loss of consciousness and was amnestic to the fall.  She complained of bilateral burning pain in the upper extremities right worse than left.  Her past medical history is significant for diabetes mellitus, neuropathy, hypothyroidism.  She underwent imaging of the head and neck and neurosurgery was consulted. Neurosurgery felt this is most likely cervical spinal cord injury mostly in a central versus hemicord pattern.  Per Dr. Katrinka Blazing, given her previous significant cervical stenosis noted on MRI this put her at a high risk for central cord injury with flexion mechanism.  Was admitted to the ICU and [facility] team consulted for admission and for vasopressor to maintain MAP greater than 85 as well as arterial line for blood pressure monitoring.  Raided for hypoglycemia.  Catheter placed due to urinary retention.  Hypotension treated with Levophed.  As per neurosurgery recommendations, she remained on elevated MAP goal for 5 days.  T and OT evaluations carried out.  She was allowed up and out of bed with cervical collar when ambulating.  Full medical management for spinal cord injury continued with close follow-up with neurosurgery.  Goal is relaxed 10/13.  Transferred out of ICU and transferred to medicine service.  Has had multiple episodes of orthostatic hypotension and is received normal saline fluid boluses.  She was started on heparin subcutaneously for DVT prophylaxis.  She is receiving gabapentin and Percocet for pain control.  She is tolerating a regular diet.  Last discussion with neurosurgery is expected waiting to proceed with decompressive surgical intervention.  She will follow-up as  outpatient.  Additionally, she underwent right hip x-rays which revealed acute fractures of the right superior and inferior pubic rami.  Orthopedic surgery was consulted and recommended weight bearing as tolerated.  Per diabetic coordinator, patient has lost approximately 20 lbs over the past 2 months although she has been on [facility] for longer time. Patient has had events of hypoglycemia including morning of 10/07 of CBG 51 which is shown by A1c of 5.9.  Patient reports history of polyneuropathy in her feet due to history of diabetes mellitus.  SPT to [facility] with CGA/MIN A for safety. Max A for hygiene. The patient requires inpatient medicine and rehabilitation evaluations and services for ongoing dysfunction secondary to spinal cord injury.  She reports that she has been able to empty her bladder without significant difficulty.  Denies incontinence of bladder.  She reports she has not had a bowel movement in several days.  Review of Systems  Constitutional:  Positive for malaise/fatigue. Negative for fever.  HENT:  Positive for tinnitus. Negative for congestion.   Eyes:  Positive for blurred vision.       Cataracts   Respiratory:  Negative for shortness of breath.   Cardiovascular:  Negative for chest pain.  Gastrointestinal:  Positive for constipation. Negative for abdominal pain, diarrhea, nausea and vomiting.  Genitourinary:  Positive for dysuria. Negative for frequency.  Musculoskeletal:  Positive for back pain and joint pain.       Left knee and foot pain>>times months  Skin:  Negative for rash.  Neurological:  Positive for weakness. Negative for sensory change.   Past Medical History:  Diagnosis Date  Arthritis    Blood dyscrasia    patient stated she is a "free bleeder"   Diabetes mellitus without complication (HCC)    Diverticulosis    Hypothyroidism    Neuropathy    Renal insufficiency    Past Surgical History:  Procedure Laterality Date   ABDOMINAL HYSTERECTOMY      BILATERAL SALPINGECTOMY Bilateral 03/16/2017   Procedure: BILATERAL SALPINGECTOMY;  Surgeon: Schermerhorn, Ihor Austin, MD;  Location: [facility] ORS;  Service: Gynecology;  Laterality: Bilateral;   COLONOSCOPY WITH PROPOFOL N/A 08/18/2018   Procedure: COLONOSCOPY WITH PROPOFOL;  Surgeon: Scot Jun, MD;  Location: [facility] ENDOSCOPY;  Service: Endoscopy;  Laterality: N/A;   CYSTOCELE REPAIR N/A 03/16/2017   Procedure: ANTERIOR REPAIR (CYSTOCELE);  Surgeon: Schermerhorn, Ihor Austin, MD;  Location: [facility] ORS;  Service: Gynecology;  Laterality: N/A;   ESOPHAGOGASTRODUODENOSCOPY (EGD) WITH PROPOFOL N/A 08/18/2018   Procedure: ESOPHAGOGASTRODUODENOSCOPY (EGD) WITH PROPOFOL;  Surgeon: Scot Jun, MD;  Location: [facility] ENDOSCOPY;  Service: Endoscopy;  Laterality: N/A;   TEAR DUCT PROBING Right    TUBAL LIGATION     VAGINAL HYSTERECTOMY N/A 03/16/2017   Procedure: HYSTERECTOMY VAGINAL;  Surgeon: Schermerhorn, Ihor Austin, MD;  Location: [facility] ORS;  Service: Gynecology;  Laterality: N/A;   WISDOM TOOTH EXTRACTION     Family History  Problem Relation Age of Onset   Breast cancer Maternal Aunt    Hypertension Mother    Hypertension Father    Diabetes Father    Social History:  reports that she has never smoked. She has never used smokeless tobacco. She reports that she does not drink alcohol and does not use drugs. Allergies:  Allergies  Allergen Reactions   Augmentin [Amoxicillin-Pot Clavulanate] Itching   Ampicillin Rash   Medications Prior to Admission  Medication Sig Dispense Refill   acetaminophen (TYLENOL) 325 MG tablet Take 2 tablets (650 mg total) by mouth every 4 (four) hours as needed for mild pain (pain score 1-3) (temp > 101.5).     albuterol (PROVENTIL) (2.5 MG/3ML) 0.083% nebulizer solution Take 3 mLs (2.5 mg total) by nebulization every 2 (two) hours as needed for wheezing.     bisacodyl (DULCOLAX) 5 MG EC tablet Take 1 tablet (5 mg total) by mouth daily as needed for moderate constipation.      calcium carbonate (OS-CAL - DOSED IN MG OF ELEMENTAL CALCIUM) 1250 (500 Ca) MG tablet Take 1 tablet by mouth daily with breakfast.     cholecalciferol (VITAMIN D3) 25 MCG (1000 UNIT) tablet Take 1,000 Units by mouth daily.     docusate sodium (COLACE) 100 MG capsule Take 1 capsule (100 mg total) by mouth 2 (two) times daily as needed for mild constipation.     enoxaparin (LOVENOX) 40 MG/0.4ML injection Inject 0.4 mLs (40 mg total) into the skin daily.     ertugliflozin L-PyroglutamicAc (STEGLATRO) 15 MG TABS tablet Take 15 mg by mouth daily.     gabapentin (NEURONTIN) 100 MG capsule Take 200 mg by mouth 2 (two) times daily.      lactase (LACTAID) 3000 units tablet Take 3,000 Units by mouth daily before breakfast.     levothyroxine (SYNTHROID) 100 MCG tablet Take 100 mcg by mouth daily before breakfast.     magnesium citrate SOLN Take 296 mLs (1 Bottle total) by mouth once as needed for severe constipation.     metoCLOPramide (REGLAN) 10 MG tablet Take 1 tablet (10 mg total) by mouth every 8 (eight) hours as needed for nausea or vomiting. (Patient  not taking: Reported on 04/14/2023) 20 tablet 1   Mouthwashes (MOUTH RINSE) LIQD solution 15 mLs by Mouth Rinse route as needed (for oral care).     Multiple Vitamin (MULTIVITAMIN WITH MINERALS) TABS tablet Take 1 tablet by mouth daily.     ondansetron (ZOFRAN) 4 MG/2ML SOLN injection Inject 2 mLs (4 mg total) into the vein every 6 (six) hours as needed for nausea.     ondansetron (ZOFRAN-ODT) 4 MG disintegrating tablet Take 1 tablet (4 mg total) by mouth every 6 (six) hours as needed for nausea or vomiting. 20 tablet 0   oxybutynin (DITROPAN-XL) 5 MG 24 hr tablet Take 5 mg by mouth at bedtime.     oxyCODONE-acetaminophen (PERCOCET/ROXICET) 5-325 MG tablet Take 2 tablets by mouth every 6 (six) hours as needed. (Patient not taking: Reported on 04/14/2023) 20 tablet 0   pantoprazole (PROTONIX) 40 MG tablet Take 1 tablet (40 mg total) by mouth daily.      polyethylene glycol (MIRALAX / GLYCOLAX) 17 g packet Take 17 g by mouth daily as needed for moderate constipation.     psyllium (REGULOID) 0.52 g capsule Take 0.52 g by mouth daily.     rosuvastatin (CRESTOR) 5 MG tablet Take 5 mg by mouth daily.     vitamin B-12 (CYANOCOBALAMIN) 100 MCG tablet Take 100 mcg by mouth daily.         Home: Home Living Family/patient expects to be discharged to:: Private residence Living Arrangements: Spouse/significant other, Children Available Help at Discharge: Family Type of Home: Apartment Home Access: Level entry Home Layout: One level Bathroom Shower/Tub: Proofreader: None   Functional History: Prior Function Prior Level of Function : Working/employed, Driving Mobility Comments: Pt reports Independent with mobility and 1 fall last year ADLs Comments: Getting dog today, husaband works fulltime, son works part time, Pt reports independent with all ADL/IADL prior to hospital admission.   Functional Status:  Mobility: Bed Mobility Overal bed mobility: Needs Assistance Bed Mobility: Rolling, Sidelying to Sit, Sit to Sidelying Rolling: Contact guard assist, Used rails Sidelying to sit: Used rails, HOB elevated, Min assist Supine to sit: Used rails, HOB elevated, Contact guard Sit to supine: Min assist, HOB elevated, Used rails Sit to sidelying: Min assist General bed mobility comments: HOB elevated and Min A for trunk assist with cueing for log roll technique and increased time needed. BLE management back to bed Transfers Overall transfer level: Needs assistance Equipment used: Rolling walker (2 wheels) Transfers: Sit to/from Stand Sit to Stand: Min assist, Contact guard assist Bed to/from chair/wheelchair/BSC transfer type:: Step pivot Step pivot transfers: Contact guard assist, Min assist General transfer comment: no c/o nausea of dizziness just not feeling well, but wanted to get on BSC although BP in standing  83/45 Ambulation/Gait Ambulation/Gait assistance: Min assist Gait Distance (Feet): 18 Feet Assistive device: Rolling walker (2 wheels) Gait Pattern/deviations: Decreased step length - right, Decreased step length - left, Decreased stride length, Decreased dorsiflexion - right, Decreased dorsiflexion - left, Narrow base of support General Gait Details: deferred this date due to low BP Gait velocity: decreased Pre-gait activities: emphasis on upright standing posture, patient is able to take 3 small side steps to the left with 2 person HHA, Mod A. pain with weight bearing on right leg/hip. MAP decreased to 50/60's with standing (no reported dizziness) and nurse titrated levophed for MAP goals during mobility efforts.   ADL: ADL Overall ADL's : Needs assistance/impaired Eating/Feeding: Maximal assistance, Sitting Grooming: Minimal assistance,  Sitting, Wash/dry face, Oral care Grooming Details (indicate cue type and reason): MAX A to brush hair at edge of bed; pt able to bring R hand to mouth/forehad, increased time and frequent vcs for attention to task Upper Body Dressing : Maximal assistance Upper Body Dressing Details (indicate cue type and reason): collar Lower Body Dressing: Maximal assistance Toilet Transfer: BSC/3in1, Stand-pivot, Contact guard assist Toilet Transfer Details (indicate cue type and reason): BSC<>EOB Toileting- Clothing Manipulation and Hygiene: Maximal assistance, Sit to/from stand Toileting - Clothing Manipulation Details (indicate cue type and reason): foley Functional mobility during ADLs: Contact guard assist, Rolling walker (2 wheels) General ADL Comments: low BP-orthostatic limiting session, however pt not reporting dizziness just that she didn't feel good   Cognition: Cognition Overall Cognitive Status: Impaired/Different from baseline Orientation Level: Oriented X4 Cognition Arousal: Alert Behavior During Therapy: WFL for tasks assessed/performed Overall  Cognitive Status: Impaired/Different from baseline Area of Impairment: Orientation, Memory, Following commands, Safety/judgement, Awareness, Attention, Problem solving Current Attention Level: Focused Following Commands: Follows one step commands inconsistently, Follows one step commands with increased time Safety/Judgement: Decreased awareness of deficits, Decreased awareness of safety Awareness: Emergent Problem Solving: Requires verbal cues, Requires tactile cues, Slow processing General Comments: very slow to process conversation this date Difficult to assess due to: Level of arousal      Physical Exam: Blood pressure (!) 116/52, pulse (!) 103, temperature 98.9 F (37.2 C), temperature source Oral, resp. rate 16, height 5\' 7"  (1.702 m), weight 60.1 kg, SpO2 99%. Physical Exam   General:  No apparent distress HEENT: Head is normocephalic, atraumatic, PERRLA, EOMI, sclera anicteric, oral mucosa pink and moist, dentures, glasses Neck: Supple without JVD or lymphadenopathy Heart: Tachycardic Chest: CTA bilaterally without wheezes, rales, or rhonchi; no distress Abdomen: Soft, non-tender, non-distended, bowel sounds positive. Extremities: No clubbing, cyanosis, or edema. Pulses are 2+ Psych: Pt's affect is appropriate. Pt is cooperative Skin: Clean and intact without signs of breakdown Neuro: Alert and oriented x 4, follows commands, cranial nerves II through XII intact, voice is a little hoarse, memory intact, fair insight Sensation intact to light touch in all 4 extremities however decreased in stocking glove type distribution below her knees-she reports this is chronic RUE: 4-/5 Deltoid, 4/5 Biceps, 4/5 Triceps, 4/5 Wrist Ext, 4/5 Grip, intrinsic hand muscles/fifth digit abduction 1 out of 5 LUE: 4/5 Deltoid, 4/5 Biceps, 4/5 Triceps, 4/5 Wrist Ext, 4/5 Grip, intrinsic hand muscles/fifth digit abduction 3 out of 5 RLE: HF 2/5, KE 3/5, ADF 1/5, APF 2-3/5 LLE: HF 4/5, KE 4/5,  ADF 4/5,  APF 4/5 No ankle clonus Musculoskeletal:  Decreased C-spine ROM Tender over right greater trochanter, pelvis Pain with ROM of right lower extremity   IV bilateral upper extremities looks okay Results for orders placed or performed during the hospital encounter of 04/13/23 (from the past 48 hour(s))  Glucose, capillary     Status: Abnormal   Collection Time: 04/20/23  5:16 PM  Result Value Ref Range   Glucose-Capillary 160 (H) 70 - 99 mg/dL    Comment: Glucose reference range applies only to samples taken after fasting for at least 8 hours.  Glucose, capillary     Status: Abnormal   Collection Time: 04/20/23  9:19 PM  Result Value Ref Range   Glucose-Capillary 133 (H) 70 - 99 mg/dL    Comment: Glucose reference range applies only to samples taken after fasting for at least 8 hours.   Comment 1 Notify RN   CBC with Differential/Platelet  Status: Abnormal   Collection Time: 04/21/23  4:45 AM  Result Value Ref Range   WBC 7.2 4.0 - 10.5 K/uL   RBC 2.95 (L) 3.87 - 5.11 MIL/uL   Hemoglobin 8.5 (L) 12.0 - 15.0 g/dL   HCT 35.0 (L) 09.3 - 81.8 %   MCV 84.1 80.0 - 100.0 fL   MCH 28.8 26.0 - 34.0 pg   MCHC 34.3 30.0 - 36.0 g/dL   RDW 29.9 37.1 - 69.6 %   Platelets 211 150 - 400 K/uL   nRBC 0.0 0.0 - 0.2 %   Neutrophils Relative % 60 %   Neutro Abs 4.4 1.7 - 7.7 K/uL   Lymphocytes Relative 26 %   Lymphs Abs 1.9 0.7 - 4.0 K/uL   Monocytes Relative 8 %   Monocytes Absolute 0.6 0.1 - 1.0 K/uL   Eosinophils Relative 3 %   Eosinophils Absolute 0.2 0.0 - 0.5 K/uL   Basophils Relative 1 %   Basophils Absolute 0.0 0.0 - 0.1 K/uL   Immature Granulocytes 2 %   Abs Immature Granulocytes 0.13 (H) 0.00 - 0.07 K/uL    Comment: Performed at [facility], [address], [place], [place] [address]  Basic metabolic panel     Status: Abnormal   Collection Time: 04/21/23  4:45 AM  Result Value Ref Range   Sodium 133 (L) 135 - 145 mmol/L   Potassium 3.9 3.5 - 5.1 mmol/L   Chloride  100 98 - 111 mmol/L   CO2 24 22 - 32 mmol/L   Glucose, Bld 117 (H) 70 - 99 mg/dL    Comment: Glucose reference range applies only to samples taken after fasting for at least 8 hours.   BUN 46 (H) 8 - 23 mg/dL   Creatinine, Ser 1.01 (H) 0.44 - 1.00 mg/dL   Calcium 8.8 (L) 8.9 - 10.3 mg/dL   GFR, Estimated 48 (L) >60 mL/min    Comment: (NOTE) Calculated using the CKD-EPI Creatinine Equation (2021)    Anion gap 9 5 - 15    Comment: Performed at [facility], [address] [place], [place] [address]  Glucose, capillary     Status: Abnormal   Collection Time: 04/21/23  7:46 AM  Result Value Ref Range   Glucose-Capillary 122 (H) 70 - 99 mg/dL    Comment: Glucose reference range applies only to samples taken after fasting for at least 8 hours.  Glucose, capillary     Status: Abnormal   Collection Time: 04/21/23 11:34 AM  Result Value Ref Range   Glucose-Capillary 150 (H) 70 - 99 mg/dL    Comment: Glucose reference range applies only to samples taken after fasting for at least 8 hours.  Glucose, capillary     Status: Abnormal   Collection Time: 04/21/23  4:47 PM  Result Value Ref Range   Glucose-Capillary 119 (H) 70 - 99 mg/dL    Comment: Glucose reference range applies only to samples taken after fasting for at least 8 hours.  Glucose, capillary     Status: Abnormal   Collection Time: 04/21/23  9:36 PM  Result Value Ref Range   Glucose-Capillary 139 (H) 70 - 99 mg/dL    Comment: Glucose reference range applies only to samples taken after fasting for at least 8 hours.   Comment 1 Notify RN   CBC with Differential/Platelet     Status: Abnormal   Collection Time: 04/22/23  4:44 AM  Result Value Ref Range   WBC 7.1 4.0 -  10.5 K/uL   RBC 2.94 (L) 3.87 - 5.11 MIL/uL   Hemoglobin 8.3 (L) 12.0 - 15.0 g/dL   HCT 16.1 (L) 09.6 - 04.5 %   MCV 87.1 80.0 - 100.0 fL   MCH 28.2 26.0 - 34.0 pg   MCHC 32.4 30.0 - 36.0 g/dL   RDW 40.9 81.1 - 91.4 %   Platelets 214 150 - 400 K/uL    nRBC 0.0 0.0 - 0.2 %   Neutrophils Relative % 74 %   Neutro Abs 5.3 1.7 - 7.7 K/uL   Lymphocytes Relative 15 %   Lymphs Abs 1.0 0.7 - 4.0 K/uL   Monocytes Relative 8 %   Monocytes Absolute 0.6 0.1 - 1.0 K/uL   Eosinophils Relative 2 %   Eosinophils Absolute 0.1 0.0 - 0.5 K/uL   Basophils Relative 0 %   Basophils Absolute 0.0 0.0 - 0.1 K/uL   Immature Granulocytes 1 %   Abs Immature Granulocytes 0.10 (H) 0.00 - 0.07 K/uL    Comment: Performed at [facility], [address] [place], [place] [address]  Basic metabolic panel     Status: Abnormal   Collection Time: 04/22/23  4:44 AM  Result Value Ref Range   Sodium 138 135 - 145 mmol/L   Potassium 4.4 3.5 - 5.1 mmol/L   Chloride 108 98 - 111 mmol/L   CO2 24 22 - 32 mmol/L   Glucose, Bld 131 (H) 70 - 99 mg/dL    Comment: Glucose reference range applies only to samples taken after fasting for at least 8 hours.   BUN 37 (H) 8 - 23 mg/dL   Creatinine, Ser 6.21 (H) 0.44 - 1.00 mg/dL   Calcium 8.4 (L) 8.9 - 10.3 mg/dL   GFR, Estimated 46 (L) >60 mL/min    Comment: (NOTE) Calculated using the CKD-EPI Creatinine Equation (2021)    Anion gap 6 5 - 15    Comment: Performed at [facility], [address] [place], [place] [address]  Glucose, capillary     Status: Abnormal   Collection Time: 04/22/23  7:39 AM  Result Value Ref Range   Glucose-Capillary 115 (H) 70 - 99 mg/dL    Comment: Glucose reference range applies only to samples taken after fasting for at least 8 hours.  Glucose, capillary     Status: Abnormal   Collection Time: 04/22/23 11:10 AM  Result Value Ref Range   Glucose-Capillary 163 (H) 70 - 99 mg/dL    Comment: Glucose reference range applies only to samples taken after fasting for at least 8 hours.   No results found.    Blood pressure (!) 116/52, pulse (!) 103, temperature 98.9 F (37.2 C), temperature source Oral, resp. rate 16, height 5\' 7"  (1.702 m), weight 60.1 kg, SpO2 99%.  Medical  Problem List and Plan: 1. Functional deficits secondary to cervical spinal cord injury after fall with history of longstanding cervical stenosis  -patient may  shower  -ELOS/Goals: 12 to 16 days, PT/OT supervision to mod I, SLP N/A  -Admit to CIR  2.  Antithrombotics: -DVT/anticoagulation:  Pharmaceutical: Lovenox  -antiplatelet therapy: none  3. Pain Management: Tylenol, Percocet as needed  -continue gabapentin 200 mg twice daily  4. Mood/Behavior/Sleep: LCSW to evaluate and provide emotional support  -antipsychotic agents: n/a  5. Neuropsych/cognition: This patient is capable of making decisions on her own behalf.  6. Skin/Wound Care: Routine skin care checks   7. Fluids/Electrolytes/Nutrition: Routine Is and Os and follow-up chemistries  --  D3 diet, thin liquids  -lactose intolerant: continue Lactaid  8: Hypothyroidism with history of multinodular goiter s/p total thyroidectomy: continue Synthroid.  T4 2.01, TSH 0.053 on 10/7  9: Hyperlipidemia: continue statin  10: Diabetes mellitus type 2 with polyneuropathy: A1c = 5.9% CBGs QID (CBG range 115-150).  Hemoglobin A1c was 10.3 in 2014  -per DC: "Patient may not need any oral meds @ discharge home along with her Mounjaro."   11: Orthostatic hypotension: place TED hose and abd binder.  Improved after fluids given  12: Normocytic anemia, chronic: follow-up CBC  13: GI prophylaxis: continue PPI  14: SCI: continue cervical collar when out of bed or transporting    -bladder scan/check PVRs   15: Right superior and inferior pubic rami fractures: Weightbearing as tolerated  16: Right arm pain: X-ray on 10/13 negative   17: Constipation vs neurogenic bowel: Give sorbitol 60 cc now>>enema if no results  18. CKD III.  Recheck labs.  Avoid nephrotoxic medications      Milinda Antis, PA-C 04/22/2023  I have personally performed a face to face diagnostic evaluation of this patient and formulated the key components of the  plan.  Additionally, I have personally reviewed laboratory data, imaging studies, as well as relevant notes and concur with the physician assistant's documentation above.  The patient's status has not changed from the original H&P.  Any changes in documentation from the acute care chart have been noted above.  Fanny Dance, MD, [place]

## 2023-04-22 NOTE — Progress Notes (Signed)
[facility] in route to pickup patient for transfer to CIR. Report given to [facility] RN via phone.   Madie Reno, RN

## 2023-04-22 NOTE — Progress Notes (Signed)
Report given to [facility] RN with CIR. AVS placed in transfer packet. Patient has been picked up by [facility].   Madie Reno, RN

## 2023-04-22 NOTE — Progress Notes (Signed)
Horton Chin, MD  Physician Physical Medicine and Rehabilitation   Consult Note     Signed   Date of Service: 04/17/2023 11:15 AM  Related encounter: ED to Hosp-Admission (Discharged) from 04/13/2023 in [facility]   Signed     Expand All Collapse All           Physical Medicine and Rehabilitation Consult Reason for Consult: Post-traumatic spinal cord compression Referring Physician: Rhea Bleacher, MD     HPI: [name] is a 67 y.o. female who presents with a cervical spinal cord injury after a fall, and was admitted with a cervical spinal cord injury in a central versus hemicord pattern. She was also found to have acute fractures of the right superior and inferior public rami and is WBAT. PMH is  significant for arthritis, blood dyscrasia, diabetes mellitus, diverticulosis, hypothyroidism, neuropathy, and renal insufficiency. Physical Medicine & Rehabilitation was consulted to assess candidacy for CIR.     ROS +RUE weakness and pain     Past Medical History:  Diagnosis Date   Arthritis     Blood dyscrasia      patient stated she is a "free bleeder"   Diabetes mellitus without complication (HCC)     Diverticulosis     Hypothyroidism     Neuropathy     Renal insufficiency               Past Surgical History:  Procedure Laterality Date   ABDOMINAL HYSTERECTOMY       BILATERAL SALPINGECTOMY Bilateral 03/16/2017    Procedure: BILATERAL SALPINGECTOMY;  Surgeon: Schermerhorn, Ihor Austin, MD;  Location: [facility] ORS;  Service: Gynecology;  Laterality: Bilateral;   COLONOSCOPY WITH PROPOFOL N/A 08/18/2018    Procedure: COLONOSCOPY WITH PROPOFOL;  Surgeon: Scot Jun, MD;  Location: [facility] ENDOSCOPY;  Service: Endoscopy;  Laterality: N/A;   CYSTOCELE REPAIR N/A 03/16/2017    Procedure: ANTERIOR REPAIR (CYSTOCELE);  Surgeon: Schermerhorn, Ihor Austin, MD;  Location: [facility] ORS;  Service: Gynecology;  Laterality: N/A;    ESOPHAGOGASTRODUODENOSCOPY (EGD) WITH PROPOFOL N/A 08/18/2018    Procedure: ESOPHAGOGASTRODUODENOSCOPY (EGD) WITH PROPOFOL;  Surgeon: Scot Jun, MD;  Location: [facility] ENDOSCOPY;  Service: Endoscopy;  Laterality: N/A;   TEAR DUCT PROBING Right     TUBAL LIGATION       VAGINAL HYSTERECTOMY N/A 03/16/2017    Procedure: HYSTERECTOMY VAGINAL;  Surgeon: Schermerhorn, Ihor Austin, MD;  Location: [facility] ORS;  Service: Gynecology;  Laterality: N/A;   WISDOM TOOTH EXTRACTION                 Family History  Problem Relation Age of Onset   Breast cancer Maternal Aunt     Hypertension Mother     Hypertension Father     Diabetes Father          Social History:  reports that she has never smoked. She has never used smokeless tobacco. She reports that she does not drink alcohol and does not use drugs. Allergies:  Allergies      Allergies  Allergen Reactions   Augmentin [Amoxicillin-Pot Clavulanate] Itching   Ampicillin Rash            Medications Prior to Admission  Medication Sig Dispense Refill   calcium carbonate (OS-CAL - DOSED IN MG OF ELEMENTAL CALCIUM) 1250 (500 Ca) MG tablet Take 1 tablet by mouth daily with breakfast.       cholecalciferol (VITAMIN D3) 25 MCG (1000 UNIT) tablet Take  1,000 Units by mouth daily.       ertugliflozin L-PyroglutamicAc (STEGLATRO) 15 MG TABS tablet Take 15 mg by mouth daily.       gabapentin (NEURONTIN) 100 MG capsule Take 200 mg by mouth 2 (two) times daily.        glipiZIDE (GLUCOTROL) 5 MG tablet Take 5 mg by mouth daily before breakfast.       lactase (LACTAID) 3000 units tablet Take 3,000 Units by mouth daily before breakfast.       levothyroxine (SYNTHROID) 100 MCG tablet Take 100 mcg by mouth daily before breakfast.       omeprazole (PRILOSEC) 40 MG capsule Take 1 capsule (40 mg total) by mouth 2 (two) times daily. Take 30 min after Synthroid and 30 min before breakfast 60 capsule 2   oxybutynin (DITROPAN-XL) 5 MG 24 hr tablet Take 5 mg by mouth at  bedtime.       psyllium (REGULOID) 0.52 g capsule Take 0.52 g by mouth daily.       rosuvastatin (CRESTOR) 5 MG tablet Take 5 mg by mouth daily.       vitamin B-12 (CYANOCOBALAMIN) 100 MCG tablet Take 100 mcg by mouth daily.       insulin glargine (LANTUS) 100 UNIT/ML Solostar Pen Inject 14 Units into the skin at bedtime. (Patient not taking: Reported on 04/14/2023)       metoCLOPramide (REGLAN) 10 MG tablet Take 1 tablet (10 mg total) by mouth every 8 (eight) hours as needed for nausea or vomiting. (Patient not taking: Reported on 04/14/2023) 20 tablet 1   ondansetron (ZOFRAN-ODT) 4 MG disintegrating tablet Take 1 tablet (4 mg total) by mouth every 6 (six) hours as needed for nausea or vomiting. 20 tablet 0   oxyCODONE-acetaminophen (PERCOCET/ROXICET) 5-325 MG tablet Take 2 tablets by mouth every 6 (six) hours as needed. (Patient not taking: Reported on 04/14/2023) 20 tablet 0          Home: Home Living Family/patient expects to be discharged to:: Private residence Living Arrangements: Spouse/significant other, Children Available Help at Discharge: Family Type of Home: Apartment Home Access: Level entry Home Layout: One level Bathroom Shower/Tub: Proofreader: None  Functional History: Prior Function Prior Level of Function : Working/employed, Driving Mobility Comments: Pt reports Independent with mobility and 1 fall last year ADLs Comments: Getting dog today, husaband works fulltime, son works part time, Pt reports independent with all ADL/IADL prior to hospital admission. Functional Status:  Mobility: Bed Mobility Overal bed mobility: Needs Assistance Bed Mobility: Sit to Supine, Supine to Sit Supine to sit: Used rails, Mod assist Sit to supine: Max assist General bed mobility comments: Pt performed bed mobility to sit at EOB MinA+2. Pt reported feeling light headed and needed a rest prior to standing at EOB. Pt experienced Right hip pain when performing bed  mobility on a pain scale of 5/10. To get back into bed, Pt continued to require MinA+2. Transfers Overall transfer level: Needs assistance Equipment used: None Transfers: Sit to/from Stand Sit to Stand: Min assist, Mod assist, +2 safety/equipment General transfer comment: two attempts, MOD A +2 for latearl steps up the bed, limited by pain Ambulation/Gait Ambulation/Gait assistance: Contact guard assist, +2 physical assistance Gait Distance (Feet): 5 Feet Assistive device: Rolling walker (2 wheels) Gait Pattern/deviations: Step-to pattern General Gait Details: Prior to sitting back at EOB, pt performed 5 lateral steps CGA+2 to the [facility] that caused further discomfort in pt's right hip. Before sitting down, pt  reported having a headache towards the end of the session. To sit back at the EOB and bed mobility to end the session, pt required MinA+2. Gait velocity: decreased   ADL: ADL Overall ADL's : Needs assistance/impaired Eating/Feeding: Maximal assistance, Sitting Grooming: Minimal assistance, Sitting, Wash/dry face, Oral care Grooming Details (indicate cue type and reason): MAX A to brush hair at edge of bed; pt able to bring R hand to mouth/forehad, increased time and frequent vcs for attention to task Upper Body Dressing : Maximal assistance Upper Body Dressing Details (indicate cue type and reason): collar Lower Body Dressing: Maximal assistance Toileting - Clothing Manipulation Details (indicate cue type and reason): foley General ADL Comments: Noted UE continues to be weak during grooming task   Cognition: Cognition Overall Cognitive Status: Impaired/Different from baseline Orientation Level: Oriented X4 Cognition Arousal: Lethargic Behavior During Therapy: Flat affect Overall Cognitive Status: Impaired/Different from baseline Area of Impairment: Attention, Following commands, Awareness, Problem solving Current Attention Level: Focused Following Commands: Follows one step  commands with increased time Awareness: Emergent Problem Solving: Requires verbal cues, Requires tactile cues, Slow processing General Comments: Pt required constant verbal cues throughout session to keep eyes open Difficult to assess due to: Level of arousal   Blood pressure 119/60, pulse (!) 101, temperature 98.1 F (36.7 C), temperature source Oral, resp. rate 20, height 5\' 6"  (1.676 m), weight 60.6 kg, SpO2 96%. Physical Exam Gen: no distress, normal appearing HEENT: oral mucosa pink and moist, NCAT Cardio: Tachycardia Chest: normal effort, normal rate of breathing Abd: soft, non-distended Ext: no edema Psych: pleasant, normal affect Skin: intact Neuro: RUE weakness- 3/5 strength throughout   Lab Results Last 24 Hours       Results for orders placed or performed during the hospital encounter of 04/13/23 (from the past 24 hour(s))  Glucose, capillary     Status: Abnormal    Collection Time: 04/16/23 12:35 PM  Result Value Ref Range    Glucose-Capillary 130 (H) 70 - 99 mg/dL  Glucose, capillary     Status: Abnormal    Collection Time: 04/16/23  3:30 PM  Result Value Ref Range    Glucose-Capillary 122 (H) 70 - 99 mg/dL  Glucose, capillary     Status: None    Collection Time: 04/16/23 10:19 PM  Result Value Ref Range    Glucose-Capillary 98 70 - 99 mg/dL  CBC     Status: Abnormal    Collection Time: 04/17/23  3:57 AM  Result Value Ref Range    WBC 8.8 4.0 - 10.5 K/uL    RBC 3.00 (L) 3.87 - 5.11 MIL/uL    Hemoglobin 8.7 (L) 12.0 - 15.0 g/dL    HCT 40.9 (L) 81.1 - 46.0 %    MCV 86.3 80.0 - 100.0 fL    MCH 29.0 26.0 - 34.0 pg    MCHC 33.6 30.0 - 36.0 g/dL    RDW 91.4 78.2 - 95.6 %    Platelets 185 150 - 400 K/uL    nRBC 0.0 0.0 - 0.2 %  Renal function panel     Status: Abnormal    Collection Time: 04/17/23  3:57 AM  Result Value Ref Range    Sodium 141 135 - 145 mmol/L    Potassium 4.5 3.5 - 5.1 mmol/L    Chloride 105 98 - 111 mmol/L    CO2 18 (L) 22 - 32 mmol/L     Glucose, Bld 106 (H) 70 - 99 mg/dL    BUN 34 (  H) 8 - 23 mg/dL    Creatinine, Ser 9.14 (H) 0.44 - 1.00 mg/dL    Calcium 8.8 (L) 8.9 - 10.3 mg/dL    Phosphorus 3.7 2.5 - 4.6 mg/dL    Albumin 3.2 (L) 3.5 - 5.0 g/dL    GFR, Estimated 49 (L) >60 mL/min    Anion gap 18 (H) 5 - 15  Magnesium     Status: None    Collection Time: 04/17/23  3:57 AM  Result Value Ref Range    Magnesium 2.0 1.7 - 2.4 mg/dL  Glucose, capillary     Status: None    Collection Time: 04/17/23  7:58 AM  Result Value Ref Range    Glucose-Capillary 94 70 - 99 mg/dL       Imaging Results (Last 48 hours)  CT HEAD WO CONTRAST ( )   Result Date: 04/16/2023 CLINICAL DATA:  67 year old female with altered mental status. EXAM: CT HEAD WITHOUT CONTRAST TECHNIQUE: Contiguous axial images were obtained from the base of the skull through the vertex without intravenous contrast. RADIATION DOSE REDUCTION: This exam was performed according to the departmental dose-optimization program which includes automated exposure control, adjustment of the mA and/or kV according to patient size and/or use of iterative reconstruction technique. COMPARISON:  Head CT 04/13/2023. FINDINGS: Brain: No midline shift, ventriculomegaly, mass effect, evidence of mass lesion, intracranial hemorrhage or evidence of cortically based acute infarction. Stable cerebral volume. Cavum septum pellucidum, normal variant. Gray-white differentiation is stable, within normal limits for age. Vascular: Calcified atherosclerosis at the skull base. No suspicious intracranial vascular hyperdensity. Skull: Stable.  No skull fracture identified. Sinuses/Orbits: Ongoing mucosal thickening and fluid in the paranasal sinuses. Low-density fluid appears to be inflammatory in nature. Tympanic cavities and mastoids remain clear. Other: Right posterior convexity scalp soft tissue injury again noted. Less soft tissue gas. Orbits soft tissues remain negative. IMPRESSION: 1. Right posterior  scalp soft tissue injury. No skull fracture identified. 2. Stable and negative noncontrast CT appearance of the brain. 3. Ongoing paranasal sinus inflammation, suspicious for acute sinusitis. Electronically Signed   By: Odessa Fleming M.D.   On: 04/16/2023 12:23       Assessment/Plan: Diagnosis: acute cervical spinal cord injury Does the need for close, 24 hr/day medical supervision in concert with the patient's rehab needs make it unreasonable for this patient to be served in a less intensive setting? Yes Co-Morbidities requiring supervision/potential complications:  1) cervical stenosis: continue cervical collar 2) CKD stage 3: monitor BMP 3) Normocytic anemia: monitor Hgb 4) Pubic rami fractures 5) Hypoglycemia Due to bladder management, bowel management, safety, skin/wound care, disease management, medication administration, pain management, and patient education, does the patient require 24 hr/day rehab nursing? Yes Does the patient require coordinated care of a physician, rehab nurse, therapy disciplines of PT, OT to address physical and functional deficits in the context of the above medical diagnosis(es)? Yes Addressing deficits in the following areas: balance, endurance, locomotion, strength, transferring, bowel/bladder control, bathing, dressing, feeding, grooming, and psychosocial support Can the patient actively participate in an intensive therapy program of at least 3 hrs of therapy per day at least 5 days per week? Yes The potential for patient to make measurable gains while on inpatient rehab is excellent Anticipated functional outcomes upon discharge from inpatient rehab are supervision  with PT, supervision with OT, independent with SLP. Estimated rehab length of stay to reach the above functional goals is: 16-20 days Anticipated discharge destination: Home Overall Rehab/Functional Prognosis: excellent   POST  ACUTE RECOMMENDATIONS: This patient's condition is appropriate for  continued rehabilitative care in the following setting: CIR Patient has agreed to participate in recommended program. Yes Note that insurance prior authorization may be required for reimbursement for recommended care.       I have personally performed a face to face diagnostic evaluation of this patient. Additionally, I have examined the patient's medical record including any pertinent labs and radiographic images. If the physician assistant has documented in this note, I have reviewed and edited or otherwise concur with the physician assistant's documentation.   Thanks,   Horton Chin, MD 04/17/2023          Routing History

## 2023-04-22 NOTE — Plan of Care (Signed)
  Problem: Coping: Goal: Ability to adjust to condition or change in health will improve Outcome: Progressing   Problem: Health Behavior/Discharge Planning: Goal: Ability to identify and utilize available resources and services will improve Outcome: Progressing   Problem: Nutrition: Goal: Adequate nutrition will be maintained Outcome: Progressing   Problem: Elimination: Goal: Will not experience complications related to urinary retention Outcome: Progressing   Problem: Pain Managment: Goal: General experience of comfort will improve Outcome: Progressing   Problem: Safety: Goal: Ability to remain free from injury will improve Outcome: Progressing   Problem: Skin Integrity: Goal: Risk for impaired skin integrity will decrease Outcome: Progressing

## 2023-04-22 NOTE — Progress Notes (Addendum)
   04/22/23 0242  Assess: MEWS Score  Temp 99 F (37.2 C)  BP (!) 78/47 (MD notified. Asymptomatic, vitals rechecked)  MAP (mmHg) (!) 58  Pulse Rate 95  SpO2 93 %  Assess: MEWS Score  MEWS Temp 0  MEWS Systolic 2  MEWS Pulse 0  MEWS RR 0  MEWS LOC 0  MEWS Score 2  MEWS Score Color Yellow  Assess: if the MEWS score is Yellow or Red  Were vital signs accurate and taken at a resting state? Yes  Does the patient meet 2 or more of the SIRS criteria? No  MEWS guidelines implemented  Yes, yellow  Treat  MEWS Interventions Considered administering scheduled or prn medications/treatments as ordered  Take Vital Signs  Increase Vital Sign Frequency  Yellow: Q2hr x1, continue Q4hrs until patient remains green for 12hrs  Escalate  MEWS: Escalate Yellow: Discuss with charge nurse and consider notifying provider and/or RRT  Notify: Charge Nurse/RN  Name of Charge Nurse/RN Notified [place], RN  Provider Notification  Provider Name/Title Para March, MD  Date Provider Notified 04/22/23  Time Provider Notified 0310  Method of Notification Page  Notification Reason Other (Comment) (bp low)  Provider response No new orders (Vitals rechecked and bp increased)  Date of Provider Response 04/22/23  Time of Provider Response 0316  Assess: SIRS CRITERIA  SIRS Temperature  0  SIRS Pulse 1  SIRS Respirations  0  SIRS WBC 0  SIRS Score Sum  1   Patient was asymptomatic. Vital signs rechecked within an hour and MEWS is now green.   BP after recheck is 93/46, Map 61, HR 94, Temp 98.7, Resp 19, Spo2 93% on room air.

## 2023-04-22 NOTE — Progress Notes (Addendum)
   04/22/23 0316  Assess: MEWS Score  Temp 98.7 F (37.1 C)  BP (!) 93/46 (MD notified)  MAP (mmHg) (!) 61  Pulse Rate 94  ECG Heart Rate 94  Resp 19  SpO2 93 %  Assess: MEWS Score  MEWS Temp 0  MEWS Systolic 1  MEWS Pulse 0  MEWS RR 0  MEWS LOC 0  MEWS Score 1  MEWS Score Color Green  Assess: if the MEWS score is Yellow or Red  Were vital signs accurate and taken at a resting state? No, vital signs rechecked  Assess: SIRS CRITERIA  SIRS Temperature  0  SIRS Pulse 1  SIRS Respirations  0  SIRS WBC 0  SIRS Score Sum  1   Patient is asymptomatic. Will continue to monitor during my shift.

## 2023-04-22 NOTE — Progress Notes (Signed)
Inpatient Rehab Admissions Coordinator:    I have insurance approval and a bed available for pt to admit to CIR today. Dr. Lyn Hollingshead in agreement.  Will let pt/family and TOC team know.   Estill Dooms, PT, DPT Admissions Coordinator [phone] 04/22/23  10:01 AM

## 2023-04-22 NOTE — TOC Progression Note (Signed)
Transition of Care [facility]) - Progression Note    Patient Details  Name: [name] MRN: [id number] Date of Birth: Nov 24, 1955  [facility] CM/SW Contact  Chapman Fitch, RN Phone Number: 04/22/2023, 9:31 AM  Clinical Narrative:    Plan remains for [facility] inpatient rehab pending bed availability    Expected Discharge Plan: IP Rehab Facility Barriers to Discharge: Continued Medical Work up  Expected Discharge Plan and Services                                               Social Determinants of Health (SDOH) Interventions SDOH Screenings   Food Insecurity: No Food Insecurity (04/13/2023)  Housing: Low Risk  (04/13/2023)  Transportation Needs: No Transportation Needs (04/13/2023)  Utilities: Not At Risk (04/13/2023)  Tobacco Use: Low Risk  (04/13/2023)    Readmission Risk Interventions     No data to display

## 2023-04-23 DIAGNOSIS — G8250 Quadriplegia, unspecified: Principal | ICD-10-CM | POA: Diagnosis present

## 2023-04-23 LAB — COMPREHENSIVE METABOLIC PANEL
ALT: 20 U/L (ref 0–44)
AST: 23 U/L (ref 15–41)
Albumin: 2.8 g/dL — ABNORMAL LOW (ref 3.5–5.0)
Alkaline Phosphatase: 79 U/L (ref 38–126)
Anion gap: 13 (ref 5–15)
BUN: 28 mg/dL — ABNORMAL HIGH (ref 8–23)
CO2: 22 mmol/L (ref 22–32)
Calcium: 9.3 mg/dL (ref 8.9–10.3)
Chloride: 105 mmol/L (ref 98–111)
Creatinine, Ser: 1.39 mg/dL — ABNORMAL HIGH (ref 0.44–1.00)
GFR, Estimated: 42 mL/min — ABNORMAL LOW (ref >60)
Glucose, Bld: 164 mg/dL — ABNORMAL HIGH (ref 70–99)
Potassium: 4.1 mmol/L (ref 3.5–5.1)
Sodium: 140 mmol/L (ref 135–145)
Total Bilirubin: 0.9 mg/dL (ref 0.3–1.2)
Total Protein: 6.7 g/dL (ref 6.5–8.1)

## 2023-04-23 LAB — CBC WITH DIFFERENTIAL/PLATELET
Abs Immature Granulocytes: 0.14 10*3/uL — ABNORMAL HIGH (ref 0.00–0.07)
Basophils Absolute: 0 10*3/uL (ref 0.0–0.1)
Basophils Relative: 0 %
Eosinophils Absolute: 0.1 10*3/uL (ref 0.0–0.5)
Eosinophils Relative: 1 %
HCT: 26 % — ABNORMAL LOW (ref 36.0–46.0)
Hemoglobin: 8.6 g/dL — ABNORMAL LOW (ref 12.0–15.0)
Immature Granulocytes: 2 %
Lymphocytes Relative: 18 %
Lymphs Abs: 1.5 10*3/uL (ref 0.7–4.0)
MCH: 28.1 pg (ref 26.0–34.0)
MCHC: 33.1 g/dL (ref 30.0–36.0)
MCV: 85 fL (ref 80.0–100.0)
Monocytes Absolute: 0.8 10*3/uL (ref 0.1–1.0)
Monocytes Relative: 9 %
Neutro Abs: 6.2 10*3/uL (ref 1.7–7.7)
Neutrophils Relative %: 70 %
Platelets: 230 10*3/uL (ref 150–400)
RBC: 3.06 MIL/uL — ABNORMAL LOW (ref 3.87–5.11)
RDW: 14.2 % (ref 11.5–15.5)
WBC: 8.7 10*3/uL (ref 4.0–10.5)
nRBC: 0 % (ref 0.0–0.2)

## 2023-04-23 LAB — GLUCOSE, CAPILLARY
Glucose-Capillary: 173 mg/dL — ABNORMAL HIGH (ref 70–99)
Glucose-Capillary: 143 mg/dL — ABNORMAL HIGH (ref 70–99)
Glucose-Capillary: 136 mg/dL — ABNORMAL HIGH (ref 70–99)
Glucose-Capillary: 181 mg/dL — ABNORMAL HIGH (ref 70–99)

## 2023-04-23 NOTE — Evaluation (Signed)
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"   Diabetes mellitus without complication (HCC)    Diverticulosis    Hypothyroidism    Neuropathy    Renal insufficiency    Past Surgical History:  Past Surgical History:  Procedure Laterality Date   ABDOMINAL HYSTERECTOMY     BILATERAL SALPINGECTOMY Bilateral 03/16/2017   Procedure: BILATERAL SALPINGECTOMY;  Surgeon: Schermerhorn, Ihor Austin, MD;  Location: [facility] ORS;  Service: Gynecology;  Laterality: Bilateral;   COLONOSCOPY WITH PROPOFOL N/A 08/18/2018   Procedure: COLONOSCOPY WITH PROPOFOL;  Surgeon: Scot Jun, MD;  Location: [facility] ENDOSCOPY;  Service: Endoscopy;  Laterality: N/A;   CYSTOCELE REPAIR N/A 03/16/2017   Procedure: ANTERIOR REPAIR (CYSTOCELE);  Surgeon: Schermerhorn, Ihor Austin, MD;  Location: [facility] ORS;  Service: Gynecology;  Laterality: N/A;   ESOPHAGOGASTRODUODENOSCOPY (EGD) WITH PROPOFOL N/A 08/18/2018   Procedure: ESOPHAGOGASTRODUODENOSCOPY (EGD) WITH PROPOFOL;  Surgeon: Scot Jun, MD;  Location: [facility] ENDOSCOPY;  Service: Endoscopy;  Laterality: N/A;   TEAR DUCT PROBING Right    TUBAL LIGATION     VAGINAL HYSTERECTOMY N/A 03/16/2017   Procedure: HYSTERECTOMY VAGINAL;  Surgeon: Schermerhorn, Ihor Austin, MD;  Location: [facility] ORS;  Service: Gynecology;  Laterality: N/A;   WISDOM TOOTH EXTRACTION      Assessment & Plan Clinical Impression: Patient is a 67 y.o. year old female with recent  admission to the hospital on 04/13/2023 after she fell backward striking the right side of her head.  EMS was called and focal collar placed.  She was not aware of loss of consciousness and was amnestic to the fall.  She complained of bilateral burning pain in the upper extremities right worse than left.  She underwent imaging of the head and neck and neurosurgery was consulted. Neurosurgery felt this is most likely cervical spinal cord injury mostly in a central versus hemicord pattern.  Per Dr. Katrinka Blazing, given her previous significant cervical stenosis noted on MRI this put her at a high risk for central cord injury with flexion mechanism.  As per neurosurgery recommendations, she remained on elevated MAP goal for 5 days.   She was allowed up and out of bed with cervical collar when ambulating.  Full medical management for spinal cord injury continued with close follow-up with neurosurgery. .  Patient transferred to CIR on 04/22/2023 .    Patient currently requires mod with basic self-care skills secondary to muscle weakness and decreased coordination.  Prior to hospitalization, patient could complete ADL's without assist.  Patient will benefit from skilled intervention to increase independence with basic self-care skills prior to discharge home with care partner.  Anticipate patient will require intermittent supervision and follow up home health.  OT - End of Session Activity Tolerance: Tolerates 10 - 20 min activity with multiple rests Endurance Deficit: Yes OT Assessment Rehab Potential (ACUTE ONLY): Good OT Barriers to Discharge: Pending surgery OT Patient demonstrates impairments in the following area(s): Balance;Sensory;Endurance;Motor;Pain;Safety OT Basic ADL's Functional Problem(s): Grooming;Bathing;Dressing;Toileting OT Advanced ADL's Functional  Problem(s): Simple Meal Preparation OT Transfers Functional Problem(s): Toilet;Tub/Shower OT Additional Impairment(s): Fuctional Use of Upper  Extremity OT Plan OT Intensity: Minimum of 1-2 x/day, 45 to 90 minutes OT Frequency: 5 out of 7 days OT Duration/Estimated Length of Stay: 12-16 days OT Treatment/Interventions: Balance/vestibular training;Disease mangement/prevention;Neuromuscular re-education;Self Care/advanced ADL retraining;Therapeutic Exercise;DME/adaptive equipment instruction;Pain management;UE/LE Strength taining/ROM;UE/LE Coordination activities;Patient/family education;Discharge planning;Functional mobility training;Therapeutic Activities OT Self Feeding Anticipated Outcome(s): Mod I OT Basic Self-Care Anticipated Outcome(s): SPV OT Toileting Anticipated Outcome(s): SPV OT Bathroom Transfers Anticipated Outcome(s): SPV OT Recommendation Recommendations for Other Services: Therapeutic Recreation consult Patient destination: Home Follow Up Recommendations: Home health OT Equipment Recommended: 3 in 1 bedside comode;Tub/shower bench;Rolling walker with 5" wheels   OT Evaluation Precautions/Restrictions  Precautions Precautions: Cervical;Fall Precaution Comments: verbally reviewed brace wear Required Braces or Orthoses: Cervical Brace Cervical Brace: Hard collar Restrictions Weight Bearing Restrictions: Yes RLE Weight Bearing: Weight bearing as tolerated LLE Weight Bearing: Weight bearing as tolerated Other Position/Activity Restrictions: per orders, ok to donn collar edge of bed, amb to bathroom/shower without c collar General Chart Reviewed: Yes Family/Caregiver Present: No Vital Signs   Pain Pain Assessment Pain Scale: 0-10 Pain Score: 9  Pain Type: Acute pain Pain Location: Hip Pain Orientation: Right Pain Radiating Towards: lower back Pain Descriptors / Indicators: Aching Pain Onset: On-going Patients Stated Pain Goal: 2 Pain Intervention(s): Medication (See eMAR) Multiple Pain Sites: Yes 2nd Pain Site Pain Type: Acute pain Pain Location: Arm Pain Orientation: Right Pain Radiating  Towards: R forearm nerve pain Home Living/Prior Functioning Home Living Family/patient expects to be discharged to:: Private residence Living Arrangements: Spouse/significant other, Children Available Help at Discharge: Family Type of Home: Apartment Home Access: Level entry Home Layout: One level Bathroom Shower/Tub: Engineer, manufacturing systems: Standard  Lives With: Spouse, Son IADL History Homemaking Responsibilities: Yes Current License: Yes Mode of TransportationGames developer Prior Function Level of Independence: Independent with basic ADLs, Independent with homemaking with ambulation, Independent with transfers, Independent with gait  Able to Take Stairs?: Yes Driving: Yes Vocation: Full time employment Vision Baseline Vision/History: 4 Cataracts;1 Wears glasses Ability to See in Adequate Light: 0 Adequate Patient Visual Report: No change from baseline Vision Assessment?: No apparent visual deficits Perception  Perception: Within Functional Limits Praxis Praxis: WFL Cognition Cognition Overall Cognitive Status: Impaired/Different from baseline Arousal/Alertness: Awake/alert Orientation Level: Person;Place;Situation Memory: Appears intact Awareness: Appears intact Problem Solving: Appears intact Safety/Judgment: Appears intact Brief Interview for Mental Status (BIMS) Repetition of Three Words (First Attempt): 1 Temporal Orientation: Year: Correct Temporal Orientation: Month: Accurate within 5 days Temporal Orientation: Day: Correct Recall: "Sock": Yes, after cueing ("something to wear") Recall: "Blue": Yes, after cueing ("a color") Recall: "Bed": Yes, no cue required BIMS Summary Score: 11 Sensation Sensation Light Touch: Impaired by gross assessment Peripheral sensation comments: Premorbid neuropathy, pt reports baseline sensation, tingling from knees down with intermittent numbness Light Touch Impaired Details: Impaired RUE;Impaired LUE Hot/Cold: Appears  Intact Proprioception: Appears Intact Stereognosis: Appears Intact Additional Comments: difficulty with one and two point descrimination. Numbness along R forearm Coordination Gross Motor Movements are Fluid and Coordinated: No Fine Motor Movements are Fluid and Coordinated: No Motor  Motor Motor - Skilled Clinical Observations: Weakness and decreased FM control on the R vs L  Trunk/Postural Assessment  Cervical Assessment Cervical Assessment: Exceptions to [facility]  Balance   Extremity/Trunk Assessment RUE Assessment RUE Assessment: Exceptions to [facility] Active Range of Motion (AROM) Comments: Patient with good RUE AROM but displays general weakness and sensory changes. Patient  with only able to accept minimal resistance throughout R side LUE Assessment LUE Assessment: Exceptions to [facility] Active Range of Motion (AROM) Comments: Patient with Full AROM. fracture to the clavicle 1 year prior. able to tolerate moderate resistance throughout LUE, but not at baseline per patient.  Care Tool Care Tool Self Care Eating   Eating Assist Level: Set up assist    Oral Care    Oral Care Assist Level: Set up assist    Bathing   Body parts bathed by patient: Right arm;Left arm;Chest;Abdomen;Front perineal area;Right upper leg;Left upper leg;Face Body parts bathed by helper: Buttocks;Left lower leg;Right lower leg   Assist Level: Moderate Assistance - Patient 50 - 74%    Upper Body Dressing(including orthotics)   What is the patient wearing?: Pull over shirt   Assist Level: Minimal Assistance - Patient > 75%    Lower Body Dressing (excluding footwear)   What is the patient wearing?: Pants;Incontinence brief Assist for lower body dressing: Maximal Assistance - Patient 25 - 49%    Putting on/Taking off footwear   What is the patient wearing?: Shoes Assist for footwear: Total Assistance - Patient < 25%       Care Tool Toileting Toileting activity   Assist for toileting: Moderate Assistance -  Patient 50 - 74%     Care Tool Bed Mobility Roll left and right activity   Roll left and right assist level: Minimal Assistance - Patient > 75%    Sit to lying activity   Sit to lying assist level: Minimal Assistance - Patient > 75%    Lying to sitting on side of bed activity   Lying to sitting on side of bed assist level: the ability to move from lying on the back to sitting on the side of the bed with no back support.: Moderate Assistance - Patient 50 - 74%     Care Tool Transfers Sit to stand transfer   Sit to stand assist level: Moderate Assistance - Patient 50 - 74%    Chair/bed transfer   Chair/bed transfer assist level: Moderate Assistance - Patient 50 - 74%     Toilet transfer   Assist Level: Moderate Assistance - Patient 50 - 74%     Care Tool Cognition  Expression of Ideas and Wants Expression of Ideas and Wants: 4. Without difficulty (complex and basic) - expresses complex messages without difficulty and with speech that is clear and easy to understand  Understanding Verbal and Non-Verbal Content Understanding Verbal and Non-Verbal Content: 4. Understands (complex and basic) - clear comprehension without cues or repetitions   Memory/Recall Ability     Refer to Care Plan for Long Term Goals  SHORT TERM GOAL WEEK 1 OT Short Term Goal 1 (Week 1): Patient to perform toilet transfer with Min assist OT Short Term Goal 2 (Week 1): Patient to perform LB dressing with AE as needed with Min assist OT Short Term Goal 3 (Week 1): Patient to perform bathing with AE as needed with Min assist OT Short Term Goal 4 (Week 1): Patient to perform grooming with set up assist sitting at sink  Recommendations for other services: Adult nurse group and Outing/community reintegration   Skilled Therapeutic Intervention ADL ADL Eating: Set up Where Assessed-Eating: Bed level Grooming: Setup Where Assessed-Grooming: Sitting at sink Upper Body Bathing: Setup Where  Assessed-Upper Body Bathing: Sitting at sink Lower Body Bathing: Moderate assistance Where Assessed-Lower Body Bathing: Sitting at sink Upper Body Dressing: Minimal assistance Where Assessed-Upper  Body Dressing: Edge of bed Lower Body Dressing: Maximal assistance Where Assessed-Lower Body Dressing: Edge of bed Toileting: Moderate assistance Where Assessed-Toileting: Bedside Commode Toilet Transfer: Moderate assistance Toilet Transfer Method: Stand pivot Toilet Transfer Equipment: Gaffer: Not assessed Film/video editor: Not assessed Mobility  Bed Mobility Bed Mobility: Supine to Sit;Sitting - Scoot to Edge of Bed;Sit to Supine Supine to Sit: Minimal Assistance - Patient > 75% Sitting - Scoot to Edge of Bed: Minimal Assistance - Patient > 75% Sit to Supine: Moderate Assistance - Patient 50-74% Transfers Sit to Stand: Moderate Assistance - Patient 50-74% Stand to Sit: Minimal Assistance - Patient > 75%   Discharge Criteria: Patient will be discharged from OT if patient refuses treatment 3 consecutive times without medical reason, if treatment goals not met, if there is a change in medical status, if patient makes no progress towards goals or if patient is discharged from hospital.  The above assessment, treatment plan, treatment alternatives and goals were discussed and mutually agreed upon: by patient  Warnell Forester 04/23/2023, 12:29 PM

## 2023-04-23 NOTE — Progress Notes (Addendum)
Pt requesting fleet enema. PRN fleet enema administrated. Explain to patient importance of retaining fluid 2 to 4 minutes. Then when feel the urge to bear down and will use the bedpan. Enema being successful for patient in relieving discomfort with stools.

## 2023-04-23 NOTE — Evaluation (Addendum)
Physical Therapy Assessment and Plan  Patient Details  Name: [name] MRN: [id number] Date of Birth: 1956/05/20  PT Diagnosis: Difficulty walking, Impaired cognition, Impaired sensation, Muscle weakness, Pain in joint, and Quadriplegia Rehab Potential: Fair ELOS: 12-16 days   Today's Date: 04/23/2023 PT Individual Time: 0900-1012, 1415-1530 PT Individual Time Calculation (min): 72 min, 75 min   Hospital Problem: Principal Problem:   Acute incomplete quadriplegia (HCC)   Past Medical History:  Past Medical History:  Diagnosis Date   Arthritis    Blood dyscrasia    patient stated she is a "free bleeder"   Diabetes mellitus without complication (HCC)    Diverticulosis    Hypothyroidism    Neuropathy    Renal insufficiency    Past Surgical History:  Past Surgical History:  Procedure Laterality Date   ABDOMINAL HYSTERECTOMY     BILATERAL SALPINGECTOMY Bilateral 03/16/2017   Procedure: BILATERAL SALPINGECTOMY;  Surgeon: Schermerhorn, Ihor Austin, MD;  Location: [facility] ORS;  Service: Gynecology;  Laterality: Bilateral;   COLONOSCOPY WITH PROPOFOL N/A 08/18/2018   Procedure: COLONOSCOPY WITH PROPOFOL;  Surgeon: Scot Jun, MD;  Location: [facility] ENDOSCOPY;  Service: Endoscopy;  Laterality: N/A;   CYSTOCELE REPAIR N/A 03/16/2017   Procedure: ANTERIOR REPAIR (CYSTOCELE);  Surgeon: Schermerhorn, Ihor Austin, MD;  Location: [facility] ORS;  Service: Gynecology;  Laterality: N/A;   ESOPHAGOGASTRODUODENOSCOPY (EGD) WITH PROPOFOL N/A 08/18/2018   Procedure: ESOPHAGOGASTRODUODENOSCOPY (EGD) WITH PROPOFOL;  Surgeon: Scot Jun, MD;  Location: [facility] ENDOSCOPY;  Service: Endoscopy;  Laterality: N/A;   TEAR DUCT PROBING Right    TUBAL LIGATION     VAGINAL HYSTERECTOMY N/A 03/16/2017   Procedure: HYSTERECTOMY VAGINAL;  Surgeon: Schermerhorn, Ihor Austin, MD;  Location: [facility] ORS;  Service: Gynecology;  Laterality: N/A;   WISDOM TOOTH EXTRACTION      Assessment & Plan Clinical Impression: Patient  is a 67 y.o. year old female with recent admission to the hospital on 04/13/2023 after she fell backward striking the right side of her head.  EMS was called and focal collar placed.  She was not aware of loss of consciousness and was amnestic to the fall.  She complained of bilateral burning pain in the upper extremities right worse than left.  She underwent imaging of the head and neck and neurosurgery was consulted. Neurosurgery felt this is most likely cervical spinal cord injury mostly in a central versus hemicord pattern.  Per Dr. Katrinka Blazing, given her previous significant cervical stenosis noted on MRI this put her at a high risk for central cord injury with flexion mechanism.  As per neurosurgery recommendations, she remained on elevated MAP goal for 5 days.   She was allowed up and out of bed with cervical collar when ambulating.  Full medical management for spinal cord injury continued with close follow-up with neurosurgery.  Patient transferred to CIR on 04/22/2023 .   Patient currently requires min with mobility secondary to muscle weakness, abnormal tone, decreased coordination, and decreased motor planning, and decreased sitting balance, decreased standing balance, decreased postural control, decreased balance strategies, and difficulty maintaining precautions.  Prior to hospitalization, patient was independent  with mobility and lived with Spouse, Son in a Apartment home.  Home access is  Level entry.  Patient will benefit from skilled PT intervention to maximize safe functional mobility, minimize fall risk, and decrease caregiver burden for planned discharge home with intermittent assist.  Anticipate patient will benefit from follow up [facility] at discharge.  PT - End of Session Activity Tolerance: Tolerates <  10 min activity with changes in vital signs Endurance Deficit: Yes Endurance Deficit Description: orthostatic hypotension PT Assessment Rehab Potential (ACUTE/IP ONLY): Fair PT Barriers to  Discharge: Home environment access/layout;Decreased caregiver support;Incontinence;Wound Care PT Patient demonstrates impairments in the following area(s): Balance;Pain;Safety;Endurance;Sensory;Skin Integrity;Motor PT Locomotion Functional Problem(s): Ambulation;Wheelchair Mobility;Stairs PT Plan PT Intensity: Minimum of 1-2 x/day ,45 to 90 minutes PT Frequency: 5 out of 7 days PT Duration Estimated Length of Stay: 12-16 days PT Treatment/Interventions: Ambulation/gait training;Cognitive remediation/compensation;DME/adaptive equipment instruction;Discharge planning;Pain management;Psychosocial support;Functional mobility training;Splinting/orthotics;Therapeutic Activities;UE/LE Strength taining/ROM;UE/LE Coordination activities;Wheelchair propulsion/positioning;Therapeutic Exercise;Stair training;Skin care/wound management;Patient/family education;Neuromuscular re-education;Functional electrical stimulation;Disease management/prevention;Balance/vestibular training;Community reintegration PT Transfers Anticipated Outcome(s): mod I with LRAd PT Locomotion Anticipated Outcome(s): supervision with LRAD PT Recommendation Recommendations for Other Services: Speech consult Follow Up Recommendations: Home health PT Equipment Recommended: To be determined Destination: Home   PT Evaluation Precautions/Restrictions Precautions Precautions: Cervical;Fall Precaution Comments: verbally reviewed brace wear Required Braces or Orthoses: Cervical Brace Cervical Brace: Hard collar Restrictions Weight Bearing Restrictions: Yes RLE Weight Bearing: Weight bearing as tolerated LLE Weight Bearing: Weight bearing as tolerated Other Position/Activity Restrictions: per orders, ok to donn collar edge of bed, amb to bathroom/shower without c collar General   Vital SignsTherapy Vitals Temp: 97.7 F (36.5 C) Temp Source: Oral Pulse Rate: 90 Resp: 16 BP: (!) 94/56 Patient Position (if appropriate):  Lying Oxygen Therapy SpO2: 97 % O2 Device: Room Air Pain Pain Assessment Pain Scale: 0-10 Pain Score: 9  Pain Type: Acute pain Pain Location: Hip Pain Orientation: Right Pain Descriptors / Indicators: Aching Pain Onset: On-going Patients Stated Pain Goal: 2 Multiple Pain Sites: Yes 2nd Pain Site Pain Type: Acute pain Pain Location: Arm Pain Orientation: Right Pain Radiating Towards: R forearm nerve pain Pain Interference Pain Interference Pain Effect on Sleep: 1. Rarely or not at all Pain Interference with Therapy Activities: 1. Rarely or not at all Pain Interference with Day-to-Day Activities: 2. Occasionally Home Living/Prior Functioning Home Living Available Help at Discharge: Family (pt's husband works 2 jobs, son could provide physical assist but has cognitive difficulty.) Type of Home: Apartment Home Access: Level entry Home Layout: One level Bathroom Shower/Tub: Engineer, manufacturing systems: Standard  Lives With: Spouse;Son Prior Function Level of Independence: Independent with gait;Independent with transfers (1 previous fall while getting into bed, but did not use AD)  Able to Take Stairs?: Yes Driving: Yes Vocation: Full time employment Vision/Perception  Vision - History Ability to See in Adequate Light: 0 Adequate Perception Perception: Within Functional Limits Praxis Praxis: WFL  Cognition Overall Cognitive Status: Impaired/Different from baseline Arousal/Alertness: Awake/alert Orientation Level: Oriented X4 Month: October Memory: Appears intact Awareness: Appears intact Problem Solving: Appears intact Safety/Judgment: Appears intact Sensation Sensation Light Touch: Impaired by gross assessment Peripheral sensation comments: Premorbid neuropathy, pt reports baseline sensation, tingling from knees down with intermittent numbness Light Touch Impaired Details: Impaired RUE;Impaired LUE;Impaired LLE;Impaired RLE Hot/Cold: Appears  Intact Proprioception: Appears Intact Stereognosis: Appears Intact Additional Comments: difficulty with one and two point descrimination. Numbness along R forearm Coordination Gross Motor Movements are Fluid and Coordinated: No Fine Motor Movements are Fluid and Coordinated: No Motor  Motor Motor: Abnormal tone;Tetraplegia Motor - Skilled Clinical Observations: Weakness and decreased FM control on the R vs L   Trunk/Postural Assessment  Cervical Assessment Cervical Assessment: Exceptions to [facility] (cervical precautions)  Balance Balance Balance Assessed: Yes Static Sitting Balance Static Sitting - Balance Support: Feet supported Static Sitting - Level of Assistance: 5: Stand by assistance Dynamic Sitting Balance Dynamic Sitting -  Balance Support: During functional activity Dynamic Sitting - Level of Assistance: 4: Min Oncologist Standing - Balance Support: During functional activity Static Standing - Level of Assistance: 4: Min assist Dynamic Standing Balance Dynamic Standing - Balance Support: During functional activity Dynamic Standing - Level of Assistance: 3: Mod assist Extremity Assessment  RUE Assessment RUE Assessment: Exceptions to [facility] Active Range of Motion (AROM) Comments: Patient with good RUE AROM but displays general weakness and sensory changes. Patient with only able to accept minimal resistance throughout R side LUE Assessment LUE Assessment: Exceptions to [facility] Active Range of Motion (AROM) Comments: Patient with Full AROM. fracture to the clavicle 1 year prior. able to tolerate moderate resistance throughout LUE, but not at baseline per patient. RLE Assessment RLE Assessment: Exceptions to [facility] Passive Range of Motion (PROM) Comments: limited by pain Active Range of Motion (AROM) Comments: limited by pain General Strength Comments: Limited to 2+/5 grossly d/t pain LLE Assessment LLE Assessment: Exceptions to [facility] General Strength  Comments: grossly 3+/5  Care Tool Care Tool Bed Mobility Roll left and right activity   Roll left and right assist level: Minimal Assistance - Patient > 75%    Sit to lying activity   Sit to lying assist level: Minimal Assistance - Patient > 75%    Lying to sitting on side of bed activity   Lying to sitting on side of bed assist level: the ability to move from lying on the back to sitting on the side of the bed with no back support.: Moderate Assistance - Patient 50 - 74%     Care Tool Transfers Sit to stand transfer   Sit to stand assist level: Moderate Assistance - Patient 50 - 74%    Chair/bed transfer   Chair/bed transfer assist level: Moderate Assistance - Patient 50 - 74%    Car transfer Car transfer activity did not occur: Safety/medical concerns ([place])        Care Tool Locomotion Ambulation Ambulation activity did not occur: Safety/medical concerns        Walk 10 feet activity Walk 10 feet activity did not occur: Safety/medical concerns       Walk 50 feet with 2 turns activity Walk 50 feet with 2 turns activity did not occur: Safety/medical concerns      Walk 150 feet activity Walk 150 feet activity did not occur: Safety/medical concerns      Walk 10 feet on uneven surfaces activity Walk 10 feet on uneven surfaces activity did not occur: Safety/medical concerns      Stairs Stair activity did not occur: Safety/medical concerns        Walk up/down 1 step activity Walk up/down 1 step or curb (drop down) activity did not occur: Safety/medical concerns      Walk up/down 4 steps activity Walk up/down 4 steps activity did not occur: Safety/medical concerns      Walk up/down 12 steps activity Walk up/down 12 steps activity did not occur: Safety/medical concerns      Pick up small objects from floor Pick up small object from the floor (from standing position) activity did not occur: Safety/medical concerns      Wheelchair Is the patient using a wheelchair?:  Yes Type of Wheelchair: Manual Wheelchair activity did not occur: Safety/medical concerns      Wheel 50 feet with 2 turns activity Wheelchair 50 feet with 2 turns activity did not occur: Safety/medical concerns    Wheel 150 feet activity Wheelchair 150 feet  activity did not occur: Safety/medical concerns      Refer to Care Plan for Long Term Goals  SHORT TERM GOAL WEEK 1 PT Short Term Goal 1 (Week 1): Pt will tolerate sitting upright OOB >4 hours PT Short Term Goal 2 (Week 1): Pt will tolerate standing more than 2 min without changes in vital signs PT Short Term Goal 3 (Week 1): Pt will ambulate >30 ft with LRAD  Recommendations for other services: None   Skilled Therapeutic Intervention Session 1: Evaluation completed (see details above) with patient education regarding purpose of PT evaluation, PT POC and goals, therapy schedule, weekly team meetings, and other CIR information including safety plan and fall risk safety.  Pt performed the below functional mobility tasks with the specified levels of skilled cuing and assistance.  Session was limited OH, even with teds in place. Have requested binder to be ordered for ease of OOB mobility.   Session 2: pt received in bed and agreeable to therapy. No pain at rest, but up to 7/10 pain with mobility.   Per nsg, pt had soft BP just before session (80's-90's systolic). Pt also noted to be sleepy and lethargic at start of session.  Pt performed the following exercises to promote LE strength and endurance:  SLR 3 x 12 BLE Ankle pumps x 20 Heel slides 2 x 5-12 BLE, limited by pain   Note improvement in arousal after activity, and pt requesting to get up. Ace wrapped legs for BP management. Sitting EOB with teds and ace wraps BP=91/48(60), HR=93. Pt performed squat pivot with min a but max cueing. Pt taken on tour of unit. Reported up dizziness that remained stable for several minutes but then increased. Pt returned to bed and was left with  all needs in reach and alarm active.   Mobility Bed Mobility Bed Mobility: Supine to Sit;Sitting - Scoot to Edge of Bed;Sit to Supine Supine to Sit: Minimal Assistance - Patient > 75% Sitting - Scoot to Edge of Bed: Minimal Assistance - Patient > 75% Sit to Supine: Moderate Assistance - Patient 50-74% Transfers Transfers: Sit to Stand;Stand to Sit;Stand Pivot Transfers Sit to Stand: Moderate Assistance - Patient 50-74% Stand to Sit: Minimal Assistance - Patient > 75% Stand Pivot Transfers: Moderate Assistance - Patient 50 - 74% Stand Pivot Transfer Details: Verbal cues for sequencing;Verbal cues for safe use of DME/AE;Manual facilitation for weight shifting Transfer (Assistive device): Rolling walker Locomotion  Gait Ambulation: No (did not attempt gait d/t BP) Gait Gait: No Stairs / Additional Locomotion Stairs: No Wheelchair Mobility Wheelchair Mobility: No   Discharge Criteria: Patient will be discharged from PT if patient refuses treatment 3 consecutive times without medical reason, if treatment goals not met, if there is a change in medical status, if patient makes no progress towards goals or if patient is discharged from hospital.  The above assessment, treatment plan, treatment alternatives and goals were discussed and mutually agreed upon: by patient  Juluis Rainier 04/23/2023, 2:55 PM

## 2023-04-23 NOTE — Progress Notes (Signed)
Met with patient, oriented to rehab and team meetings every Tuesday to discuss progress, goals, barriers, equipment needs, and discharge date.  Follow up will be provided by SW.  Discussed today is evaluation day with therapy.  Okay for family to bring in easy pull up/down and on/off pants and shirts.  Bring good shoes, no slippers as they were sitting in the corner.  Reports that had a rough night, said that had really large BM this morning and took a lot out of her.  Also reports some pain to right arm, unsure if fell on it.  Reports that had fall at work and [employer] comp not paying and needs FMLA paperwork. Reports that history of low blood pressure when standing and that not a work related fall. Defer to SW and someone already put FMLA beside name. Updated board with collar on OOB and pelvic fractures.  Encouraged fluids.  Discussed orthostatic hypotension and that will work and try different things to improve to prevent this from happening again.  Nurse in room to provide medications.  All needs met, call bell in reach.

## 2023-04-23 NOTE — Progress Notes (Signed)
Inpatient Rehabilitation  Patient information reviewed and entered into eRehab system by [facility]. Karen Kays., CCC/SLP, PPS Coordinator.  Information including medical coding, functional ability and quality indicators will be reviewed and updated through discharge.

## 2023-04-23 NOTE — Progress Notes (Signed)
Complaining of upper abdominal pain this morning. Expressing pain radiating downwards just below umbilical area. PRN medication given, repositioned in bed, given water, given prune/orange juice, and heating packs/with barrier placed where patient expresses discomfort/cramps are located.

## 2023-04-23 NOTE — Progress Notes (Signed)
Inpatient Rehabilitation Care Coordinator Assessment and Plan Patient Details  Name: [name] MRN: [id number] Date of Birth: 1956/03/15  Today's Date: 04/23/2023  Hospital Problems: Principal Problem:   Acute incomplete quadriplegia [facility])  Past Medical History:  Past Medical History:  Diagnosis Date   Arthritis    Blood dyscrasia    patient stated she is a "free bleeder"   Diabetes mellitus without complication (HCC)    Diverticulosis    Hypothyroidism    Neuropathy    Renal insufficiency    Past Surgical History:  Past Surgical History:  Procedure Laterality Date   ABDOMINAL HYSTERECTOMY     BILATERAL SALPINGECTOMY Bilateral 03/16/2017   Procedure: BILATERAL SALPINGECTOMY;  Surgeon: Suzy Bouchard, MD;  Location: [facility] ORS;  Service: Gynecology;  Laterality: Bilateral;   COLONOSCOPY WITH PROPOFOL N/A 08/18/2018   Procedure: COLONOSCOPY WITH PROPOFOL;  Surgeon: Scot Jun, MD;  Location: [facility] ENDOSCOPY;  Service: Endoscopy;  Laterality: N/A;   CYSTOCELE REPAIR N/A 03/16/2017   Procedure: ANTERIOR REPAIR (CYSTOCELE);  Surgeon: Schermerhorn, Ihor Austin, MD;  Location: [facility] ORS;  Service: Gynecology;  Laterality: N/A;   ESOPHAGOGASTRODUODENOSCOPY (EGD) WITH PROPOFOL N/A 08/18/2018   Procedure: ESOPHAGOGASTRODUODENOSCOPY (EGD) WITH PROPOFOL;  Surgeon: Scot Jun, MD;  Location: [facility] ENDOSCOPY;  Service: Endoscopy;  Laterality: N/A;   TEAR DUCT PROBING Right    TUBAL LIGATION     VAGINAL HYSTERECTOMY N/A 03/16/2017   Procedure: HYSTERECTOMY VAGINAL;  Surgeon: Schermerhorn, Ihor Austin, MD;  Location: [facility] ORS;  Service: Gynecology;  Laterality: N/A;   WISDOM TOOTH EXTRACTION     Social History:  reports that she has never smoked. She has never used smokeless tobacco. She reports that she does not drink alcohol and does not use drugs.  Family / Support Systems Marital Status: Married How Long?: 32 years Patient Roles: Spouse, Parent Spouse/Significant Other:  Jorja Loa (husband) [phone] Children: 2 children- Marchelle Folks (lives in [place]), and Gaynelle Adu (lives in the home with them/special needs). Other Supports: N/A Anticipated Caregiver: husband Tim Ability/Limitations of Caregiver: PT husband has a seizure disorder but is able to be independent and currently drives. Son is able to help PRN. Husband works W/S/S at [employer] PT- 7am-4pm and M/T/TR/F 6am-4pm as a Naval architect. Reports he intends to take some time off. Caregiver Availability: 24/7 Family Dynamics: Pt lives with husband and son.  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 Education: high school Health Literacy - How often do you need to have someone help you when you read instructions, pamphlets, or other written material from your doctor or pharmacy?: Never Writes: Yes Employment Status: Employed Name of Employer: [employer] Return to Work Plans: TBD. Pt states she is pursing FMLA through employer if able too. She is considering retiring as well. Legal History/Current Legal Issues: Denies Guardian/Conservator: Denies   Abuse/Neglect Abuse/Neglect Assessment Can Be Completed: Yes Physical Abuse: Denies Verbal Abuse: Denies Sexual Abuse: Denies Exploitation of patient/patient's resources: Denies Self-Neglect: Denies  Patient response to: Social Isolation - How often do you feel lonely or isolated from those around you?: Never  Emotional Status Pt's affect, behavior and adjustment status: Pt tired at time of visit. She is adjusting as well as to be expected. Recent Psychosocial Issues: Denies Psychiatric History: Denies Substance Abuse History: Denies  Patient / Family Perceptions, Expectations & Goals Pt/Family understanding of illness & functional limitations: Pt and family have a general understanding of care needs Premorbid pt/family roles/activities:  Independent Anticipated changes in roles/activities/participation: Assistance with ADLs/IADLs Pt/family expectations/goals: Pt goal is to work towards "getting my well-being back, make me strong again, get back to work if able too."  [employer]: None Premorbid Home Care/DME Agencies: None Transportation available at discharge: Husband Is the patient able to respond to transportation needs?: Yes In the past 12 months, has lack of transportation kept you from medical appointments or from getting medications?: No In the past 12 months, has lack of transportation kept you from meetings, work, or from getting things needed for daily living?: No Resource referrals recommended: Neuropsychology  Discharge Planning Living Arrangements: Spouse/significant other, Children Support Systems: Spouse/significant other, Children Type of Residence: Private residence Insurance Resources: Media planner (specify) Administrator Medicare) Financial Resources: Employment Surveyor, quantity Screen Referred: No Living Expenses: Psychologist, sport and exercise Management: Patient, Family (dtr helps her manage finances by taking money out to pay for her bills.) Does the patient have any problems obtaining your medications?: No Home Management: Pt and husband manage home care needs Patient/Family Preliminary Plans: Husband Care Coordinator Barriers to Discharge: Decreased caregiver support, Lack of/limited family support, Insurance for SNF coverage Care Coordinator Anticipated Follow Up Needs: HH/OP  Clinical Impression SW met with pt at beside to introduce self, explain role, and discuss discharge process. No HCPOA in place but has forms to complete. No DME. Pt aware SW to follow-up with her husband.   1340- SW spoke with pt husband to introduce self, explain role, and discuss discharge process. He reports he is willing to provide assistance to his wife. Will continue to provide updates as available.   Islah Eve A  Ewell Benassi 04/23/2023, 3:31 PM

## 2023-04-23 NOTE — Progress Notes (Addendum)
PROGRESS NOTE   Subjective/Complaints:   Pt reports really dry- had rough night- had abd pain and then fleets enema- extra large BM- on bedpan.  Peeing but mainly incontinent.  No muscle spasms.    ROS:  Pt denies SOB, abd pain, CP, N/V/C/D, and vision changes Except for HPI  Objective:   No results found. Recent Labs    04/22/23 0444 04/23/23 0506  WBC 7.1 8.7  HGB 8.3* 8.6*  HCT 25.6* 26.0*  PLT 214 230   Recent Labs    04/22/23 0444 04/23/23 0506  NA 138 140  K 4.4 4.1  CL 108 105  CO2 24 22  GLUCOSE 131* 164*  BUN 37* 28*  CREATININE 1.28* 1.39*  CALCIUM 8.4* 9.3    Intake/Output Summary (Last 24 hours) at 04/23/2023 0839 Last data filed at 04/22/2023 1800 Gross per 24 hour  Intake 236 ml  Output 100 ml  Net 136 ml        Physical Exam: Vital Signs Blood pressure 109/60, pulse (!) 104, temperature 98.6 F (37 C), resp. rate 19, height 5\' 7"  (1.702 m), weight 60.1 kg, SpO2 95%.    General: awake, alert, appropriate,  but just woke up; NAD HENT: conjugate gaze; oropharynx and lips extremely dry- gave water- no teeth CV: regular rhythm, slightly tachycardic rate at rest; no JVD Pulmonary: CTA B/L; no W/R/R- good air movement GI: soft, NT, ND, (+)BS- normoactive Psychiatric: appropriate Neurological: Ox3- but slightly delayed responses- just woke up R DF 4-/5 and PF 4-/5 LLE DF/PF 3-/5 R grip 4-/5 and FA 3/5  Neuro: Alert and oriented x 4, follows commands, cranial nerves II through XII intact, voice is a little hoarse, memory intact, fair insight Sensation intact to light touch in all 4 extremities however decreased in stocking glove type distribution below her knees-she reports this is chronic RUE: 4-/5 Deltoid, 4/5 Biceps, 4/5 Triceps, 4/5 Wrist Ext, 4/5 Grip, intrinsic hand muscles/fifth digit abduction 1 out of 5 LUE: 4/5 Deltoid, 4/5 Biceps, 4/5 Triceps, 4/5 Wrist Ext, 4/5 Grip,  intrinsic hand muscles/fifth digit abduction 3 out of 5 RLE: HF 2/5, KE 3/5, ADF 1/5, APF 2-3/5 LLE: HF 4/5, KE 4/5,  ADF 4/5, APF 4/5 No ankle clonus Musculoskeletal:  Decreased C-spine ROM Tender over right greater trochanter, pelvis Pain with ROM of right lower extremity  Assessment/Plan: 1. Functional deficits which require 3+ hours per day of interdisciplinary therapy in a comprehensive inpatient rehab setting. Physiatrist is providing close team supervision and 24 hour management of active medical problems listed below. Physiatrist and rehab team continue to assess barriers to discharge/monitor patient progress toward functional and medical goals  Care Tool:  Bathing              Bathing assist       Upper Body Dressing/Undressing Upper body dressing        Upper body assist      Lower Body Dressing/Undressing Lower body dressing            Lower body assist       Toileting Toileting    Toileting assist       Transfers Chair/bed transfer  Transfers  assist           Locomotion Ambulation   Ambulation assist              Walk 10 feet activity   Assist           Walk 50 feet activity   Assist           Walk 150 feet activity   Assist           Walk 10 feet on uneven surface  activity   Assist           Wheelchair     Assist               Wheelchair 50 feet with 2 turns activity    Assist            Wheelchair 150 feet activity     Assist          Blood pressure 109/60, pulse (!) 104, temperature 98.6 F (37 C), resp. rate 19, height 5\' 7"  (1.702 m), weight 60.1 kg, SpO2 95%.  Medical Problem List and Plan: 1. Functional deficits secondary to incomplete quadriplegia- ASIA D after fall with history of longstanding cervical stenosis             -patient may  shower             -ELOS/Goals: 12 to 16 days, PT/OT supervision to mod I, SLP N/A             -Admit to CIR    First day of evaluations- con't CIR _PT and OT 2.  Antithrombotics: -DVT/anticoagulation:  Pharmaceutical: Lovenox             -antiplatelet therapy: none   3. Pain Management: Tylenol, Percocet as needed             -continue gabapentin 200 mg twice daily   10/17- no complaints of pain- con't regimen 4. Mood/Behavior/Sleep: LCSW to evaluate and provide emotional support             -antipsychotic agents: n/a   5. Neuropsych/cognition: This patient is capable of making decisions on her own behalf.   6. Skin/Wound Care: Routine skin care checks   7. Fluids/Electrolytes/Nutrition: Routine Is and Os and follow-up chemistries             --D3 diet, thin liquids             -lactose intolerant: continue Lactaid   8: Hypothyroidism with history of multinodular goiter s/p total thyroidectomy: continue Synthroid.  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  Hemoglobin A1c was 10.3 in 2014             -per DC: "Patient may not need any oral meds @ discharge home along with her Mounjaro."    10/17- pt's CBGs 115-173 in last 24 hours- will monitor 11: Orthostatic hypotension: place TED hose and abd binder.  Improved after fluids given   10/17- will probably be an issue today- not drinking well 12: Normocytic anemia, chronic: follow-up CBC   13: GI prophylaxis: continue PPI   14: SCI: continue cervical collar when out of bed or transporting               -bladder scan/check PVRs    10/17- her RLE ankle DF/PF is stronger than seen yesterday -  and LLE stable at 3-/5- will wait on PRAFOs. Also doesn't appear to need [facility] 15: Right superior and inferior pubic rami fractures: Weightbearing as tolerated   16: Right arm pain: X-ray on 10/13 negative   17: Constipation vs neurogenic bowel: Give sorbitol 60 cc now>>enema if no results   18. CKD IIIA.  Recheck labs.  Avoid  nephrotoxic medications   10/17-BUN up to 1.39 from 1.28 however BUN better at 28- down from 37- will push fluids- write order and recheck in AM- will also check CMP weekly.  19. Neurogenic bowel?  10/17- LBM last night after fleets enema- but had been >5 days since LBM- will monitor and see if needs bowel program 20. Neurogenic bladder?  10/17- having a lot of incontinent voids- will check bladder scans to see if due to overflow and is retaining- have ordered   I spent a total of 43   minutes on total care today- >50% coordination of care- due to  Reviewing chart and H&P- interview and exam of pt- d/w nursing about pushing fluids and bladder scans/I/o caths and review of bladder, bowel; nursing notes, and vitals and labs.         LOS: 1 days A FACE TO FACE EVALUATION WAS PERFORMED  [name] 04/23/2023, 8:39 AM

## 2023-04-23 NOTE — Plan of Care (Signed)
  Problem: Sit to Stand Goal: LTG:  Patient will perform sit to stand in prep for activites of daily living with assistance level (OT) Description: LTG:  Patient will perform sit to stand in prep for activites of daily living with assistance level (OT) Flowsheets (Taken 04/23/2023 1208) LTG: PT will perform sit to stand in prep for activites of daily living with assistance level: Independent with assistive device   Problem: RH Eating Goal: LTG Patient will perform eating w/assist, cues/equip (OT) Description: LTG: Patient will perform eating with assist, with/without cues using equipment (OT) Flowsheets (Taken 04/23/2023 1208) LTG: Pt will perform eating with assistance level of: Independent with assistive device    Problem: RH Grooming Goal: LTG Patient will perform grooming w/assist,cues/equip (OT) Description: LTG: Patient will perform grooming with assist, with/without cues using equipment (OT) Flowsheets (Taken 04/23/2023 1208) LTG: Pt will perform grooming with assistance level of: Independent with assistive device    Problem: RH Bathing Goal: LTG Patient will bathe all body parts with assist levels (OT) Description: LTG: Patient will bathe all body parts with assist levels (OT) Flowsheets (Taken 04/23/2023 1208) LTG: Pt will perform bathing with assistance level/cueing: Supervision/Verbal cueing LTG: Position pt will perform bathing: Shower   Problem: RH Dressing Goal: LTG Patient will perform upper body dressing (OT) Description: LTG Patient will perform upper body dressing with assist, with/without cues (OT). Flowsheets (Taken 04/23/2023 1208) LTG: Pt will perform upper body dressing with assistance level of: Set up assist Goal: LTG Patient will perform lower body dressing w/assist (OT) Description: LTG: Patient will perform lower body dressing with assist, with/without cues in positioning using equipment (OT) Flowsheets (Taken 04/23/2023 1208) LTG: Pt will perform lower  body dressing with assistance level of: Supervision/Verbal cueing   Problem: RH Toileting Goal: LTG Patient will perform toileting task (3/3 steps) with assistance level (OT) Description: LTG: Patient will perform toileting task (3/3 steps) with assistance level (OT)  Flowsheets (Taken 04/23/2023 1208) LTG: Pt will perform toileting task (3/3 steps) with assistance level: Supervision/Verbal cueing   Problem: RH Toilet Transfers Goal: LTG Patient will perform toilet transfers w/assist (OT) Description: LTG: Patient will perform toilet transfers with assist, with/without cues using equipment (OT) Flowsheets (Taken 04/23/2023 1208) LTG: Pt will perform toilet transfers with assistance level of: Supervision/Verbal cueing   Problem: RH Tub/Shower Transfers Goal: LTG Patient will perform tub/shower transfers w/assist (OT) Description: LTG: Patient will perform tub/shower transfers with assist, with/without cues using equipment (OT) Flowsheets (Taken 04/23/2023 1208) LTG: Pt will perform tub/shower stall transfers with assistance level of: Supervision/Verbal cueing

## 2023-04-24 DIAGNOSIS — G8250 Quadriplegia, unspecified: Secondary | ICD-10-CM | POA: Diagnosis not present

## 2023-04-24 LAB — BASIC METABOLIC PANEL
Anion gap: 11 (ref 5–15)
BUN: 30 mg/dL — ABNORMAL HIGH (ref 8–23)
CO2: 24 mmol/L (ref 22–32)
Calcium: 9.1 mg/dL (ref 8.9–10.3)
Chloride: 102 mmol/L (ref 98–111)
Creatinine, Ser: 1.24 mg/dL — ABNORMAL HIGH (ref 0.44–1.00)
GFR, Estimated: 48 mL/min — ABNORMAL LOW (ref >60)
Glucose, Bld: 164 mg/dL — ABNORMAL HIGH (ref 70–99)
Potassium: 4.5 mmol/L (ref 3.5–5.1)
Sodium: 137 mmol/L (ref 135–145)

## 2023-04-24 LAB — GLUCOSE, CAPILLARY
Glucose-Capillary: 121 mg/dL — ABNORMAL HIGH (ref 70–99)
Glucose-Capillary: 123 mg/dL — ABNORMAL HIGH (ref 70–99)
Glucose-Capillary: 137 mg/dL — ABNORMAL HIGH (ref 70–99)
Glucose-Capillary: 209 mg/dL — ABNORMAL HIGH (ref 70–99)

## 2023-04-24 NOTE — Progress Notes (Signed)
Physical Therapy Session Note  Patient Details  Name: [name] MRN: [id number] Date of Birth: 22-Mar-1956  Today's Date: 04/24/2023 PT Individual Time: 1353-1439 PT Individual Time Calculation (min): 46 min   Short Term Goals: Week 1:  PT Short Term Goal 1 (Week 1): Pt will tolerate sitting upright OOB >4 hours PT Short Term Goal 2 (Week 1): Pt will tolerate standing more than 2 min without changes in vital signs PT Short Term Goal 3 (Week 1): Pt will ambulate >30 ft with LRAD  Skilled Therapeutic Interventions/Progress Updates:    Pt received in recliner and agreeable to therapy.  Pt reports unrated pain in R hip and RUE, premedicated. Rest and positioning provided as needed. Note pt reports stabbing pain in RUE and pain with all wrist motions and extreme muscle guarding with no known etiology other than fall that caused admission. X ray on 10/13 was clear and pt has no notable swelling or bruising, but description is inconsistent with nerve pain.   BP at start of session =113/60, no s/s of OH this session.   Pt ambulated ~30 ft with RW, with very slow pace and CGA. Antalgic gait pattern. Pt agreeable to LE exercise so set up on kinetron x 2 min. Pt tolerated well but fatigued quickly. Pt requested to return to bed and did so with min A, was left with all needs in reach and alarm active.   Therapy Documentation Precautions:  Precautions Precautions: Cervical, Fall Precaution Comments: verbally reviewed brace wear Required Braces or Orthoses: Cervical Brace Cervical Brace: Hard collar Restrictions Weight Bearing Restrictions: Yes RLE Weight Bearing: Weight bearing as tolerated LLE Weight Bearing: Weight bearing as tolerated Other Position/Activity Restrictions: per orders, ok to donn collar edge of bed, amb to bathroom/shower without c collar General:       Therapy/Group: Individual Therapy  Juluis Rainier 04/24/2023, 4:08 PM

## 2023-04-24 NOTE — Progress Notes (Signed)
Occupational Therapy Session Note  Patient Details  Name: [name] MRN: [id number] Date of Birth: [birth year]  Today's Date: 04/24/2023 OT Individual Time: 6269-4854 OT Individual Time Calculation (min): 70 min    Short Term Goals: Week 1:  OT Short Term Goal 1 (Week 1): Patient to perform toilet transfer with Min assist OT Short Term Goal 2 (Week 1): Patient to perform LB dressing with AE as needed with Min assist OT Short Term Goal 3 (Week 1): Patient to perform bathing with AE as needed with Min assist OT Short Term Goal 4 (Week 1): Patient to perform grooming with set up assist sitting at sink  Skilled Therapeutic Interventions/Progress Updates:    OT intervention with focus on bed mobility, standing balance, transfers, dressing with sit<>stand, and safety awarness. Supine>sit EOB with mod A. Min A for sit<>stand with RW and amb to w/c for dressing tasks. Max A for LB dressing without AE. UB dressing with min A. Standing balance with CGA for hygiene. Pt incontinent of bladder but able to complete hygiene without assistance while standing. Pt amb back to EOB with CGA. Sit>supine with min A. Pt remained in bed with all needs wihtin reach. Minimal s/s of orthostasis with Ted hose donned and Ace wraps applied. Bed alarm activated.   Therapy Documentation Precautions:  Precautions Precautions: Cervical, Fall Precaution Comments: verbally reviewed brace wear Required Braces or Orthoses: Cervical Brace Cervical Brace: Hard collar Restrictions Weight Bearing Restrictions: Yes RLE Weight Bearing: Weight bearing as tolerated LLE Weight Bearing: Weight bearing as tolerated Other Position/Activity Restrictions: per orders, ok to donn collar edge of bed, amb to bathroom/shower without c collar   Pain: Pt reports RLE/hip pain with activity; rest and repositioning   Therapy/Group: Individual Therapy  Rich Brave 04/24/2023, 9:29 AM

## 2023-04-24 NOTE — Plan of Care (Signed)
CHL Tonsillectomy/Adenoidectomy, Postoperative PEDS care plan entered in error.

## 2023-04-24 NOTE — Progress Notes (Signed)
Physical Therapy Session Note  Patient Details  Name: [name] MRN: [id number] Date of Birth: 1955/10/12  Today's Date: 04/24/2023 PT Individual Time: 1050-1212 PT Individual Time Calculation (min): 82 min   Short Term Goals: Week 1:  PT Short Term Goal 1 (Week 1): Pt will tolerate sitting upright OOB >4 hours PT Short Term Goal 2 (Week 1): Pt will tolerate standing more than 2 min without changes in vital signs PT Short Term Goal 3 (Week 1): Pt will ambulate >30 ft with LRAD  Skilled Therapeutic Interventions/Progress Updates: Pt presented in bed agreeable to therapy. Pt c/o 5/10 pain in pelvic area. BP pressures monitored throughout session. Pt requiring more than a reasonable amount of time for all activities due to pain and guarding. Pt performed supine therex as follows prior to transitioning to EOB. Completed ankle pumps, heel slides, hip abd/add x 10 bilaterally. Pt completed supine to sit with CGA for truncal support, increased time and use of bed features. Pt completed additional LAQ, hip flexion (WLP on RLE) and ROM elbow flexion/extension for BP management. Upon standing (after taking BP) pt expressed need for bathroom. Completed ambulatory transfer to bathroom with CGA. Pt required minA for LB clothing management and had continent urinary void at toilet. Once completed pt required minA for Sit to stand from lowered toilet and pt was able to manage LB clothes with CGA. Pt then ambulated to sink and completed hand hygiene in standing. Pt then had seated rest in w/c and transported to day room. Participated in gait training ~34ft then pt indicated increased lightheadedness. Pt returned to w/c and transported back to room for BP assessment (as noted below). Pt then completed ambulatory transfer to recliner with CGA. Pt able to complete posterior scoot in recliner with increased time and supervision. Pt left in recliner at end of session with call bell within reach and needs met.   BP   At beginning of session 99/55 (68) HR 80 Supine after therex: 017/60 (75)  Upon sitting: 92/58 (68) HR 88 Standing 0 min: 103/50 (66) HR 87 After toileting 106/53 (70) HR 91 At end of session upon returning to room 113/57 (73)      Therapy Documentation Precautions:  Precautions Precautions: Cervical, Fall Precaution Comments: verbally reviewed brace wear Required Braces or Orthoses: Cervical Brace Cervical Brace: Hard collar Restrictions Weight Bearing Restrictions: Yes RLE Weight Bearing: Weight bearing as tolerated LLE Weight Bearing: Weight bearing as tolerated Other Position/Activity Restrictions: per orders, ok to donn collar edge of bed, amb to bathroom/shower without c collar    Therapy/Group: Individual Therapy  Rachelanne Whidby 04/24/2023, 4:23 PM

## 2023-04-24 NOTE — IPOC Note (Signed)
Overall Plan of Care [facility]) Patient Details Name: [name] MRN: [id number] DOB: 07-27-1955  Admitting Diagnosis: Acute incomplete quadriplegia [facility])  Hospital Problems: Principal Problem:   Acute incomplete quadriplegia (HCC)     Functional Problem List: Nursing Safety, Bowel, Sensory, Endurance, Motor, Pain  PT Balance, Pain, Safety, Endurance, Sensory, Skin Integrity, Motor  OT Balance, Sensory, Endurance, Motor, Pain, Safety  SLP    TR         Basic ADL's: OT Grooming, Bathing, Dressing, Toileting     Advanced  ADL's: OT Simple Meal Preparation     Transfers: PT    OT Toilet, Tub/Shower     Locomotion: PT Ambulation, Wheelchair Mobility, Stairs     Additional Impairments: OT Fuctional Use of Upper Extremity  SLP        TR      Anticipated Outcomes Item Anticipated Outcome  Self Feeding Mod I  Swallowing      Basic self-care  SPV  Toileting  SPV   Bathroom Transfers SPV  Bowel/Bladder  continent of bowel/bladder with resolved constipation  Transfers  mod I with LRAd  Locomotion  supervision with LRAD  Communication     Cognition     Pain  less than 4  Safety/Judgment  no falls   Therapy Plan: PT Intensity: Minimum of 1-2 x/day ,45 to 90 minutes PT Frequency: 5 out of 7 days PT Duration Estimated Length of Stay: 12-16 days OT Intensity: Minimum of 1-2 x/day, 45 to 90 minutes OT Frequency: 5 out of 7 days OT Duration/Estimated Length of Stay: 12-16 days     Team Interventions: Nursing Interventions Patient/Family Education, Medication Management, Psychosocial Support, Bowel Management, Disease Management/Prevention, Dysphagia/Aspiration Precaution Training, Pain Management, Discharge Planning  PT interventions Ambulation/gait training, Cognitive remediation/compensation, DME/adaptive equipment instruction, Discharge planning, Pain management, Psychosocial support, Functional mobility training, Splinting/orthotics, Therapeutic  Activities, UE/LE Strength taining/ROM, UE/LE Coordination activities, Wheelchair propulsion/positioning, Therapeutic Exercise, Stair training, Skin care/wound management, Patient/family education, Neuromuscular re-education, Functional electrical stimulation, Disease management/prevention, Warden/ranger, Community reintegration  OT Interventions Warden/ranger, Disease mangement/prevention, Neuromuscular re-education, Self Care/advanced ADL retraining, Therapeutic Exercise, DME/adaptive equipment instruction, Pain management, UE/LE Strength taining/ROM, UE/LE Coordination activities, Patient/family education, Discharge planning, Functional mobility training, Therapeutic Activities  SLP Interventions    TR Interventions    SW/CM Interventions Discharge Planning, Psychosocial Support, Patient/Family Education   Barriers to Discharge MD  Medical stability, Home enviroment access/loayout, Incontinence, Wound care, Lack of/limited family support, Weight, and Weight bearing restrictions  Nursing Decreased caregiver support, Pending surgery home with spouse 1 level with level entry  PT Home environment access/layout, Decreased caregiver support, Incontinence, Wound Care    OT Pending surgery    SLP      SW Decreased caregiver support, Lack of/limited family support, Community education officer for SNF coverage     Team Discharge Planning: Destination: PT-  ,OT- Home , SLP-  Projected Follow-up: PT-Home health PT, OT-  Home health OT, SLP-  Projected Equipment Needs: PT-To be determined, OT- 3 in 1 bedside comode, Tub/shower bench, Rolling walker with 5" wheels, SLP-  Equipment Details: PT- , OT-  Patient/family involved in discharge planning: PT- Patient,  OT-Patient, SLP-   MD ELOS: 12-16 days Medical Rehab Prognosis:  Good Assessment: The patient has been admitted for CIR therapies with the diagnosis of SCI- . The team will be addressing functional mobility, strength, stamina,  balance, safety, adaptive techniques and equipment, self-care, bowel and bladder mgt, patient and caregiver education, . Goals have been set at [facility]-  supervision. Anticipated discharge destination is home.        See Team Conference Notes for weekly updates to the plan of care

## 2023-04-24 NOTE — Progress Notes (Signed)
PROGRESS NOTE   Subjective/Complaints:   Pt reports no pain  Was looking into getting FMLA paperwork done.  LBM 2 days ago- had "8 lbs baby"-  Denies pain Said injury occurred when at work.    ROS:   Pt denies SOB, abd pain, CP, N/V/C/D, and vision changes  Except for HPI  Objective:   No results found. Recent Labs    04/22/23 0444 04/23/23 0506  WBC 7.1 8.7  HGB 8.3* 8.6*  HCT 25.6* 26.0*  PLT 214 230   Recent Labs    04/22/23 0444 04/23/23 0506  NA 138 140  K 4.4 4.1  CL 108 105  CO2 24 22  GLUCOSE 131* 164*  BUN 37* 28*  CREATININE 1.28* 1.39*  CALCIUM 8.4* 9.3    Intake/Output Summary (Last 24 hours) at 04/24/2023 1610 Last data filed at 04/24/2023 0339 Gross per 24 hour  Intake 460 ml  Output 250 ml  Net 210 ml        Physical Exam: Vital Signs Blood pressure 104/60, pulse 88, temperature 97.9 F (36.6 C), temperature source Oral, resp. rate 19, height 5\' 7"  (1.702 m), weight 57.9 kg, SpO2 96%.     General: awake, alert, appropriate, sitting up in bed; getting blood drawn; NAD HENT: conjugate gaze; oropharynx less dry CV: regular rate and rhythm; no JVD Pulmonary: CTA B/L; no W/R/R- good air movement GI: soft, NT, ND, (+)BS- normoactive Psychiatric: appropriate- more interactive Neurological: Ox3  R DF 4-/5 and PF 4-/5 LLE DF/PF 3-/5 R grip 4-/5 and FA 3/5  Neuro: Alert and oriented x 4, follows commands, cranial nerves II through XII intact, voice is a little hoarse, memory intact, fair insight Sensation intact to light touch in all 4 extremities however decreased in stocking glove type distribution below her knees-she reports this is chronic RUE: 4-/5 Deltoid, 4/5 Biceps, 4/5 Triceps, 4/5 Wrist Ext, 4/5 Grip, intrinsic hand muscles/fifth digit abduction 1 out of 5 LUE: 4/5 Deltoid, 4/5 Biceps, 4/5 Triceps, 4/5 Wrist Ext, 4/5 Grip, intrinsic hand muscles/fifth digit abduction  3 out of 5 RLE: HF 2/5, KE 3/5, ADF 1/5, APF 2-3/5 LLE: HF 4/5, KE 4/5,  ADF 4/5, APF 4/5 No ankle clonus Musculoskeletal:  Decreased C-spine ROM Tender over right greater trochanter, pelvis Pain with ROM of right lower extremity  Assessment/Plan: 1. Functional deficits which require 3+ hours per day of interdisciplinary therapy in a comprehensive inpatient rehab setting. Physiatrist is providing close team supervision and 24 hour management of active medical problems listed below. Physiatrist and rehab team continue to assess barriers to discharge/monitor patient progress toward functional and medical goals  Care Tool:  Bathing    Body parts bathed by patient: Right arm, Left arm, Chest, Abdomen, Front perineal area, Right upper leg, Left upper leg, Face   Body parts bathed by helper: Buttocks, Left lower leg, Right lower leg     Bathing assist Assist Level: Moderate Assistance - Patient 50 - 74%     Upper Body Dressing/Undressing Upper body dressing   What is the patient wearing?: Pull over shirt    Upper body assist Assist Level: Minimal Assistance - Patient > 75%  Lower Body Dressing/Undressing Lower body dressing      What is the patient wearing?: Pants, Incontinence brief     Lower body assist Assist for lower body dressing: Maximal Assistance - Patient 25 - 49%     Toileting Toileting    Toileting assist Assist for toileting: Moderate Assistance - Patient 50 - 74%     Transfers Chair/bed transfer  Transfers assist     Chair/bed transfer assist level: Moderate Assistance - Patient 50 - 74%     Locomotion Ambulation   Ambulation assist   Ambulation activity did not occur: Safety/medical concerns          Walk 10 feet activity   Assist  Walk 10 feet activity did not occur: Safety/medical concerns        Walk 50 feet activity   Assist Walk 50 feet with 2 turns activity did not occur: Safety/medical concerns         Walk 150  feet activity   Assist Walk 150 feet activity did not occur: Safety/medical concerns         Walk 10 feet on uneven surface  activity   Assist Walk 10 feet on uneven surfaces activity did not occur: Safety/medical concerns         Wheelchair     Assist Is the patient using a wheelchair?: Yes Type of Wheelchair: Manual Wheelchair activity did not occur: Safety/medical concerns         Wheelchair 50 feet with 2 turns activity    Assist    Wheelchair 50 feet with 2 turns activity did not occur: Safety/medical concerns       Wheelchair 150 feet activity     Assist  Wheelchair 150 feet activity did not occur: Safety/medical concerns       Blood pressure 104/60, pulse 88, temperature 97.9 F (36.6 C), temperature source Oral, resp. rate 19, height 5\' 7"  (1.702 m), weight 57.9 kg, SpO2 96%.  Medical Problem List and Plan: 1. Functional deficits secondary to incomplete quadriplegia- ASIA D after fall with history of longstanding cervical stenosis             -patient may  shower             -ELOS/Goals: 12 to 16 days, PT/OT supervision to mod I, SLP N/A             -Admit to CIR   Con't CIR PT and OT  Limited by quadriparesis- pt reports occurred at work- so thinks it's worker's comp- but not filed under that.  2.  Antithrombotics: -DVT/anticoagulation:  Pharmaceutical: Lovenox             -antiplatelet therapy: none   3. Pain Management: Tylenol, Percocet as needed             -continue gabapentin 200 mg twice daily   10/17- no complaints of pain- con't regimen 4. Mood/Behavior/Sleep: LCSW to evaluate and provide emotional support             -antipsychotic agents: n/a   5. Neuropsych/cognition: This patient is capable of making decisions on her own behalf.   6. Skin/Wound Care: Routine skin care checks   7. Fluids/Electrolytes/Nutrition: Routine Is and Os and follow-up chemistries             --D3 diet, thin liquids             -lactose  intolerant: continue Lactaid   8: Hypothyroidism with history of multinodular goiter s/p total  thyroidectomy: continue Synthroid.  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  Hemoglobin A1c was 10.3 in 2014             -per DC: "Patient may not need any oral meds @ discharge home along with her Mounjaro."    10/17- pt's CBGs 115-173 in last 24 hours- will monitor  10/18- overall controlled- con't regimen 11: Orthostatic hypotension: place TED hose and abd binder.  Improved after fluids given   10/17- will probably be an issue today- not drinking well  10/18-BP running 90's to 100s systolic- if has orthostasis- will need Midodrine  12: Normocytic anemia, chronic: follow-up CBC   13: GI prophylaxis: continue PPI   14: SCI: continue cervical collar when out of bed or transporting               -bladder scan/check PVRs    10/17- her RLE ankle DF/PF is stronger than seen yesterday - and LLE stable at 3-/5- will wait on PRAFOs. Also doesn't appear to need [facility] 15: Right superior and inferior pubic rami fractures: Weightbearing as tolerated   16: Right arm pain: X-ray on 10/13 negative   17: Constipation vs neurogenic bowel: Give sorbitol 60 cc now>>enema if no results   18. CKD IIIA.  Recheck labs.  Avoid nephrotoxic medications   10/17-BUN up to 1.39 from 1.28 however BUN better at 28- down from 37- will push fluids- write order and recheck in AM- will also check CMP weekly. 10/18- just got labs- is pending  19. Neurogenic bowel?  10/17- LBM last night after fleets enema- but had been >5 days since LBM- will monitor and see if needs bowel program 20. Neurogenic bladder?  10/17- having a lot of incontinent voids- will check bladder scans to see if due to overflow and is retaining- have ordered  10/18- 2 bladder scans- 0 and 12ml- will con't for now- but her incontinence has  resolved it appears, so doing better   I spent a total of    minutes on total care today- >50% coordination of care- due to  Did IPOC; d/w SW about FMLA paperwork as well as will deal with labs today    LOS: 2 days A FACE TO FACE EVALUATION WAS PERFORMED  [name] 04/24/2023, 8:21 AM

## 2023-04-24 NOTE — Progress Notes (Signed)
Endorses since July after surgery been increasingly cold and difficulty staying warm.

## 2023-04-24 NOTE — Progress Notes (Signed)
Orthopedic Tech Progress Note Patient Details:  [name] 07/07/1956 [id number]  Ortho Devices Type of Ortho Device: Abdominal binder Ortho Device/Splint Location: stomach Ortho Device/Splint Interventions: Ordered   Post Interventions Patient Tolerated: Well Instructions Provided: Care of device  [name] 04/24/2023, 7:39 PM

## 2023-04-24 NOTE — Progress Notes (Signed)
Pt c/o burning pain to right heel. Blanchable redness noted to right heel. Heel foam intact. Heels elevated on pillow. Pt educated on pressure relief. Pt in agreement Mylo Red, LPN

## 2023-04-24 NOTE — Care Management (Signed)
[facility] Individual Statement of Services  Patient Name:  [name]  Date:  04/24/2023  Welcome to the [facility].  Our goal is to provide you with an individualized program based on your diagnosis and situation, designed to meet your specific needs.  With this comprehensive rehabilitation program, you will be expected to participate in at least 3 hours of rehabilitation therapies Monday-Friday, with modified therapy programming on the weekends.  Your rehabilitation program will include the following services:  Physical Therapy (PT), Occupational Therapy (OT), 24 hour per day rehabilitation nursing, Therapeutic Recreaction (TR), Psychology, Neuropsychology, Care Coordinator, Rehabilitation Medicine, Nutrition Services, Pharmacy Services, and Other  Weekly team conferences will be held on Tuesdays to discuss your progress.  Your Inpatient Rehabilitation Care Coordinator will talk with you frequently to get your input and to update you on team discussions.  Team conferences with you and your family in attendance may also be held.  Expected length of stay: 12-16 days    Overall anticipated outcome: Supervision  Depending on your progress and recovery, your program may change. Your Inpatient Rehabilitation Care Coordinator will coordinate services and will keep you informed of any changes. Your Inpatient Rehabilitation Care Coordinator's name and contact numbers are listed  below.  The following services may also be recommended but are not provided by the [facility]:  Driving Evaluations Home Health Rehabiltiation Services Outpatient Rehabilitation Services Vocational Rehabilitation   Arrangements will be made to provide these services after discharge if needed.  Arrangements include referral to agencies that provide these services.  Your insurance has been verified to be:  [employer]  Your primary doctor is:  Jerl Mina  Pertinent information will be shared with your doctor and your insurance company.  Inpatient Rehabilitation Care Coordinator:  Susie Cassette [phone] or ([phone]  Information discussed with and copy given to patient by: Gretchen Short, 04/24/2023, 11:43 AM

## 2023-04-24 NOTE — Progress Notes (Signed)
Patient ID: [name], female   DOB: 1955/11/15, 67 y.o.   MRN: [id number]  SW received phone call from pt husband [name] inquiring if there are any updates. SW informed on ELOS, and will update after team conference.  Cecile Sheerer, MSW, LCSWA Office: [phone] Cell: [phone] Fax: [phone]

## 2023-04-25 DIAGNOSIS — K592 Neurogenic bowel, not elsewhere classified: Secondary | ICD-10-CM

## 2023-04-25 DIAGNOSIS — E1142 Type 2 diabetes mellitus with diabetic polyneuropathy: Secondary | ICD-10-CM | POA: Diagnosis not present

## 2023-04-25 DIAGNOSIS — G8250 Quadriplegia, unspecified: Secondary | ICD-10-CM | POA: Diagnosis not present

## 2023-04-25 DIAGNOSIS — N1831 Chronic kidney disease, stage 3a: Secondary | ICD-10-CM | POA: Diagnosis not present

## 2023-04-25 DIAGNOSIS — N319 Neuromuscular dysfunction of bladder, unspecified: Secondary | ICD-10-CM

## 2023-04-25 DIAGNOSIS — I951 Orthostatic hypotension: Secondary | ICD-10-CM | POA: Diagnosis not present

## 2023-04-25 LAB — GLUCOSE, CAPILLARY
Glucose-Capillary: 112 mg/dL — ABNORMAL HIGH (ref 70–99)
Glucose-Capillary: 157 mg/dL — ABNORMAL HIGH (ref 70–99)
Glucose-Capillary: 126 mg/dL — ABNORMAL HIGH (ref 70–99)
Glucose-Capillary: 123 mg/dL — ABNORMAL HIGH (ref 70–99)

## 2023-04-25 NOTE — Progress Notes (Signed)
Physical Therapy Session Note  Patient Details  Name: [name] MRN: [id number] Date of Birth: 07-12-1955  Today's Date: 04/25/2023 PT Individual Time: 1100-1159 PT Individual Time Calculation (min): 59 min   Short Term Goals: Week 1:  PT Short Term Goal 1 (Week 1): Pt will tolerate sitting upright OOB >4 hours PT Short Term Goal 2 (Week 1): Pt will tolerate standing more than 2 min without changes in vital signs PT Short Term Goal 3 (Week 1): Pt will ambulate >30 ft with LRAD  Skilled Therapeutic Interventions/Progress Updates:  Pt was seen bedside in the am, pt sitting up in recliner. Pt transferred sit to stand with min to mod A and verbal cues with rolling walker and increased time. Pt transferred to w/c with rolling walker and min to mod A with verbal cues. Pt transported to rehab gym. Pt ambulated 25 feet x 2 with rolling walker and min A with verbal cues and increased time. Monitored vitals during treatment. BP 122/56 and decreased to 109/57 after ambulation but no complaints of dizziness. Pt performed multiple sit to stand and stand pivot transfers in the gym with rolling walker and min A with verbal cues and increased time. Pt performed B hip flex and LAQs, 3 sets x 10 reps each to increase ROM and LE strength. Pt returned to room. Pt ambulated about 20 feet to recliner with rolling walker and min A with verbal cues and increased time. Pt left sitting up in recliner with all needs within reach.   Therapy Documentation Precautions:  Precautions Precautions: Cervical, Fall Precaution Comments: verbally reviewed brace wear Required Braces or Orthoses: Cervical Brace Cervical Brace: Hard collar Restrictions Weight Bearing Restrictions: Yes RLE Weight Bearing: Weight bearing as tolerated LLE Weight Bearing: Weight bearing as tolerated Other Position/Activity Restrictions: per orders, ok to donn collar edge of bed, amb to bathroom/shower without c collar General:   Vital  Signs:   Pain: No c/o pain  Therapy/Group: Individual Therapy  Rayford Halsted 04/25/2023, 12:07 PM

## 2023-04-25 NOTE — Progress Notes (Signed)
Occupational Therapy Session Note  Patient Details  Name: [name] MRN: [id number] Date of Birth: 23-Aug-1955  Today's Date: 04/25/2023 OT Individual Time: 0845-1000 OT Individual Time Calculation (min): 75 min    Short Term Goals: Week 1:  OT Short Term Goal 1 (Week 1): Patient to perform toilet transfer with Min assist OT Short Term Goal 2 (Week 1): Patient to perform LB dressing with AE as needed with Min assist OT Short Term Goal 3 (Week 1): Patient to perform bathing with AE as needed with Min assist OT Short Term Goal 4 (Week 1): Patient to perform grooming with set up assist sitting at sink  Skilled Therapeutic Interventions/Progress Updates:    Patient in bed at the time of arrival, patient indicated that she rested well, however, she had a pain response of a 4 on a 0-10 scale on the R heel.  The patient indicated that she hadn't report her pain level to nursing, however,  I indicated that I would make nursing aware.  The pt's cervical collar was put in place and she  was able to transfer from bed LOF to the EOB with SBA.  The pt was able to wash her UB with s/u assist. The pt's stomach binder was put in place with MaxA,  and her over head shirt was placed on with MinA.  The pt was transported to the restroom and was able to bathe LB inclusive of  bilateral ULE,  perineal , and bottom with s/u assist using the grab bars for additional balance. The pt was ModA for donning her brief and pants and MaxA for her compression stocking and shoes.  The pt was  returned to her living quarters and was able to wash her hand and brush her teeth with s/u assist.  At the end of the session, the NT came in and transferred the pt from the wheel chair to the recliner addressing all additional needs.   Therapy Documentation Precautions:  Precautions Precautions: Cervical, Fall Precaution Comments: verbally reviewed brace wear Required Braces or Orthoses: Cervical Brace Cervical Brace: Hard  collar Restrictions Weight Bearing Restrictions: Yes RLE Weight Bearing: Weight bearing as tolerated LLE Weight Bearing: Weight bearing as tolerated Other Position/Activity Restrictions: per orders, ok to donn collar edge of bed, amb to bathroom/shower without c collar Therapy/Group: Individual Therapy  Lavona Mound 04/25/2023, 5:55 PM

## 2023-04-25 NOTE — Progress Notes (Signed)
PROGRESS NOTE   Subjective/Complaints:   No acute events overnight. Reports she feels very fatigues from therapy today-worked really hard. No new concerns. Reports she has occasional cough and mucus in her throat, this has been going on for 4 weeks.  Reports continued pain in her right hip.  She also has occasional pain in her right arm and right heel, she does not want any additional medications for this and says it is overall under control.                      ROS:   Pt denies fever, chills, SOB, abd pain, CP, N/V/C/D, and vision changes  Except for HPI  Objective:   No results found. Recent Labs    04/23/23 0506  WBC 8.7  HGB 8.6*  HCT 26.0*  PLT 230   Recent Labs    04/23/23 0506 04/24/23 0750  NA 140 137  K 4.1 4.5  CL 105 102  CO2 22 24  GLUCOSE 164* 164*  BUN 28* 30*  CREATININE 1.39* 1.24*  CALCIUM 9.3 9.1    Intake/Output Summary (Last 24 hours) at 04/25/2023 2038 Last data filed at 04/25/2023 1348 Gross per 24 hour  Intake 420 ml  Output 0 ml  Net 420 ml        Physical Exam: Vital Signs Blood pressure (!) 103/52, pulse 86, temperature 98 F (36.7 C), temperature source Oral, resp. rate 18, height 5\' 7"  (1.702 m), weight 57.9 kg, SpO2 98%.     General: awake, alert, appropriate, lying in bed, NAD HENT: conjugate gaze; oropharynx less dry CV: regular rate and rhythm; no JVD Pulmonary: CTA B/L; no W/R/R- good air movement GI: soft, NT, ND, (+)BS- normoactive Psychiatric: appropriate, cooperative Neurological: Ox3 Moving all 4 extremities in bed Skin: Protective dressing over right heel  Prior exam Neuro: Alert and oriented x 4, follows commands, cranial nerves II through XII intact, voice is a little hoarse, memory intact, fair insight Sensation intact to light touch in all 4 extremities however decreased in stocking glove type distribution below her knees-she reports this is  chronic RUE: 4-/5 Deltoid, 4/5 Biceps, 4/5 Triceps, 4/5 Wrist Ext, 4/5 Grip, intrinsic hand muscles/fifth digit abduction 1 out of 5 LUE: 4/5 Deltoid, 4/5 Biceps, 4/5 Triceps, 4/5 Wrist Ext, 4/5 Grip, intrinsic hand muscles/fifth digit abduction 3 out of 5 RLE: HF 2/5, KE 3/5, ADF 1/5, APF 2-3/5 LLE: HF 4/5, KE 4/5,  ADF 4/5, APF 4/5 No ankle clonus Musculoskeletal:  Decreased C-spine ROM Tender over right greater trochanter, pelvis Pain with ROM of right lower extremity  Assessment/Plan: 1. Functional deficits which require 3+ hours per day of interdisciplinary therapy in a comprehensive inpatient rehab setting. Physiatrist is providing close team supervision and 24 hour management of active medical problems listed below. Physiatrist and rehab team continue to assess barriers to discharge/monitor patient progress toward functional and medical goals  Care Tool:  Bathing    Body parts bathed by patient: Right arm, Left arm, Chest, Abdomen, Front perineal area, Buttocks, Right upper leg, Left upper leg, Face   Body parts bathed by helper: Right lower leg, Left lower leg  Bathing assist Assist Level: Minimal Assistance - Patient > 75%     Upper Body Dressing/Undressing Upper body dressing   What is the patient wearing?: Pull over shirt    Upper body assist Assist Level: Minimal Assistance - Patient > 75%    Lower Body Dressing/Undressing Lower body dressing      What is the patient wearing?: Incontinence brief, Pants     Lower body assist Assist for lower body dressing: Maximal Assistance - Patient 25 - 49%     Toileting Toileting    Toileting assist Assist for toileting: Moderate Assistance - Patient 50 - 74%     Transfers Chair/bed transfer  Transfers assist     Chair/bed transfer assist level: Minimal Assistance - Patient > 75%     Locomotion Ambulation   Ambulation assist   Ambulation activity did not occur: Safety/medical concerns  Assist level:  Minimal Assistance - Patient > 75% Assistive device: Walker-rolling Max distance: 25   Walk 10 feet activity   Assist  Walk 10 feet activity did not occur: Safety/medical concerns  Assist level: Minimal Assistance - Patient > 75% Assistive device: Walker-rolling   Walk 50 feet activity   Assist Walk 50 feet with 2 turns activity did not occur: Safety/medical concerns         Walk 150 feet activity   Assist Walk 150 feet activity did not occur: Safety/medical concerns         Walk 10 feet on uneven surface  activity   Assist Walk 10 feet on uneven surfaces activity did not occur: Safety/medical concerns         Wheelchair     Assist Is the patient using a wheelchair?: Yes Type of Wheelchair: Manual Wheelchair activity did not occur: Safety/medical concerns         Wheelchair 50 feet with 2 turns activity    Assist    Wheelchair 50 feet with 2 turns activity did not occur: Safety/medical concerns       Wheelchair 150 feet activity     Assist  Wheelchair 150 feet activity did not occur: Safety/medical concerns       Blood pressure (!) 103/52, pulse 86, temperature 98 F (36.7 C), temperature source Oral, resp. rate 18, height 5\' 7"  (1.702 m), weight 57.9 kg, SpO2 98%.  Medical Problem List and Plan: 1. Functional deficits secondary to incomplete quadriplegia- ASIA D after fall with history of longstanding cervical stenosis             -patient may  shower             -ELOS/Goals: 12 to 16 days, PT/OT supervision to mod I, SLP N/A             -Admit to CIR   Con't CIR PT and OT  Limited by quadriparesis- pt reports occurred at work- so thinks it's worker's comp- but not filed under that.  2.  Antithrombotics: -DVT/anticoagulation:  Pharmaceutical: Lovenox             -antiplatelet therapy: none   3. Pain Management: Tylenol, Percocet as needed             -continue gabapentin 200 mg twice daily   10/17- no complaints of pain-  con't regimen  10/19-reports pain in her right arm, right hip and right foot.  Overall under control with current medications  4. Mood/Behavior/Sleep: LCSW to evaluate and provide emotional support             -  antipsychotic agents: n/a   5. Neuropsych/cognition: This patient is capable of making decisions on her own behalf.   6. Skin/Wound Care: Routine skin care checks   7. Fluids/Electrolytes/Nutrition: Routine Is and Os and follow-up chemistries             --D3 diet, thin liquids             -lactose intolerant: continue Lactaid   8: Hypothyroidism with history of multinodular goiter s/p total thyroidectomy: continue Synthroid.  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  Hemoglobin A1c was 10.3 in 2014             -per DC: "Patient may not need any oral meds @ discharge home along with her Mounjaro."    10/17- pt's CBGs 115-173 in last 24 hours- will monitor  10/18- overall controlled- con't regimen  -10/19 CBGs well-controlled, continue current regimen  CBG (last 3)  Recent Labs    04/25/23 0610 04/25/23 1203 04/25/23 1657  GLUCAP 112* 157* 126*    11: Orthostatic hypotension: place TED hose and abd binder.  Improved after fluids given   10/17- will probably be an issue today- not drinking well  10/18-BP running 90's to 100s systolic- if has orthostasis- will need Midodrine   10/19 BP soft but stable, continue current regimen     04/25/2023    7:41 PM 04/25/2023    2:06 PM 04/25/2023    5:22 AM  Vitals with BMI  Systolic 103 114 130  Diastolic 52 63 55  Pulse 86 88 79    12: Normocytic anemia, chronic: follow-up CBC   13: GI prophylaxis: continue PPI   14: SCI: continue cervical collar when out of bed or transporting               -bladder scan/check PVRs    10/17- her RLE ankle DF/PF is stronger than seen yesterday - and LLE stable at 3-/5-  will wait on PRAFOs. Also doesn't appear to need [facility] 15: Right superior and inferior pubic rami fractures: Weightbearing as tolerated   16: Right arm pain: X-ray on 10/13 negative   17: Constipation vs neurogenic bowel: Give sorbitol 60 cc now>>enema if no results   18. CKD IIIA.  Recheck labs.  Avoid nephrotoxic medications   10/17-BUN up to 1.39 from 1.28 however BUN better at 28- down from 37- will push fluids- write order and recheck in AM- will also check CMP weekly. 10/9 creatinine/BUN stable at 1.24/30 19. Neurogenic bowel?  10/17- LBM last night after fleets enema- but had been >5 days since LBM- will monitor and see if needs bowel program  10/19 last BM 10/17, continue to monitor for now 20. Neurogenic bladder?  10/17- having a lot of incontinent voids- will check bladder scans to see if due to overflow and is retaining- have ordered  10/18- 2 bladder scans- 0 and 12ml- will con't for now- but her incontinence has resolved it appears, so doing better  -10/19 continent, last several PVRs not elevated    LOS: 3 days A FACE TO FACE EVALUATION WAS PERFORMED  Fanny Dance 04/25/2023, 8:38 PM

## 2023-04-25 NOTE — Progress Notes (Signed)
Physical Therapy Session Note  Patient Details  Name: [name] MRN: [id number] Date of Birth: [birth year]  Today's Date: 04/25/2023 PT Individual Time: 1405-1507 PT Individual Time Calculation (min): 62 min   Short Term Goals: Week 1:  PT Short Term Goal 1 (Week 1): Pt will tolerate sitting upright OOB >4 hours PT Short Term Goal 2 (Week 1): Pt will tolerate standing more than 2 min without changes in vital signs PT Short Term Goal 3 (Week 1): Pt will ambulate >30 ft with LRAD  Skilled Therapeutic Interventions/Progress Updates:    Pt received sitting in recliner with B LEs elevated and agreeable to therapy session with min encouragement despite reporting increased pain and fatigue. Pt wearing hard cervical collar, knee high TEDs, and abdominal binder. Pt reports need to use bathroom.   Sit>stand recliner>RW with light min A for lifting to stand. Gait training ~24ft to bathroom using RW with CGA - pt moves very slow with min reciprocal stepping pattern - pt reports slow speed is due to increased pain. Standing with CGA pt able to use B UEs to manage LB clothing without assist. Sit<>stand BSC over toilet<>RW with CGA for steadying while rising/lowering.  Continent of bladder and performed seated peri-care without assist. Gait training ~54ft out to sink using RW with CGA and education on proper placement of AD at sink - standing hand hygiene with CGA as pt starting to have worsening crouched posture at this time due to fatigue and increasing pain.    Transported to/from gym in w/c for time management and energy conservation.  Sit<>stands using RW to w/c or chair with armrests with CGA progressing towards light min A at end of session.  Gait training 61ft x2 using RW with CGA for safety. Pt demonstrating the following gait deviations with therapist providing the described cuing and facilitation for improvement:  - continues to move with very slow, guarded speed due to increased pain with  small step lengths but achieves min reciprocal pattern with no significant other gait deviations  Transported back to room.  R stand pivot w/c>EOB using RW with continued CGA/light min A and pt moving even more slowly with slight crouched posture due to increasing pain. Doffed abdominal binder and teds. Sit>supine with min A for B LE management into bed and cuing for reverse logroll technique for increased independence and pain management. Doffed collar. Pt left supine in bed with needs in reach and bed alarm on.  Therapy Documentation Precautions:  Precautions Precautions: Cervical, Fall Precaution Comments: verbally reviewed brace wear Required Braces or Orthoses: Cervical Brace Cervical Brace: Hard collar Restrictions Weight Bearing Restrictions: Yes RLE Weight Bearing: Weight bearing as tolerated LLE Weight Bearing: Weight bearing as tolerated Other Position/Activity Restrictions: per orders, ok to donn collar edge of bed, amb to bathroom/shower without c collar   Pain: Pt reports pain in knees and tailbone from sitting for so long as well as reports experiencing tingling pain in Bilateral feet - nurse present for medication administration upon therapist arrival - therapist utilizes emotional support and distraction for pain management during session.    Therapy/Group: Individual Therapy  Ginny Forth , PT, DPT, NCS, CSRS 04/25/2023, 12:37 PM

## 2023-04-26 DIAGNOSIS — I951 Orthostatic hypotension: Secondary | ICD-10-CM | POA: Diagnosis not present

## 2023-04-26 DIAGNOSIS — K592 Neurogenic bowel, not elsewhere classified: Secondary | ICD-10-CM | POA: Diagnosis not present

## 2023-04-26 DIAGNOSIS — G8250 Quadriplegia, unspecified: Secondary | ICD-10-CM | POA: Diagnosis not present

## 2023-04-26 DIAGNOSIS — E1142 Type 2 diabetes mellitus with diabetic polyneuropathy: Secondary | ICD-10-CM | POA: Diagnosis not present

## 2023-04-26 LAB — GLUCOSE, CAPILLARY
Glucose-Capillary: 108 mg/dL — ABNORMAL HIGH (ref 70–99)
Glucose-Capillary: 125 mg/dL — ABNORMAL HIGH (ref 70–99)
Glucose-Capillary: 139 mg/dL — ABNORMAL HIGH (ref 70–99)
Glucose-Capillary: 133 mg/dL — ABNORMAL HIGH (ref 70–99)

## 2023-04-26 MED ORDER — SENNOSIDES-DOCUSATE SODIUM 8.6-50 MG PO TABS
1.0000 | ORAL_TABLET | Freq: Every day | ORAL | Status: DC
  Administered 2023-04-26 – 2023-04-30 (×5): 1 via ORAL
  Filled 2023-04-26 (×5): qty 1

## 2023-04-26 NOTE — Progress Notes (Signed)
PROGRESS NOTE   Subjective/Complaints:   No acute events overnight noted.  She reports she is "chillin" today.  She reports irritation with foam border dressings to her heels.                     ROS:   Pt denies fever, chills, SOB, abd pain, CP, N/V/C/D, and vision changes, new motor or sensory changes  Except for HPI  Objective:   No results found. No results for input(s): "WBC", "HGB", "HCT", "PLT" in the last 72 hours.  Recent Labs    04/24/23 0750  NA 137  K 4.5  CL 102  CO2 24  GLUCOSE 164*  BUN 30*  CREATININE 1.24*  CALCIUM 9.1    Intake/Output Summary (Last 24 hours) at 04/26/2023 1628 Last data filed at 04/26/2023 0931 Gross per 24 hour  Intake 220 ml  Output 3350 ml  Net -3130 ml        Physical Exam: Vital Signs Blood pressure 116/66, pulse 88, temperature 98.1 F (36.7 C), temperature source Oral, resp. rate 17, height 5\' 7"  (1.702 m), weight 57.9 kg, SpO2 97%.     General: awake, alert, appropriate, lying in bed, NAD HENT: conjugate gaze; oropharynx less dry CV: regular rate and rhythm; no JVD Pulmonary: CTA B/L; no W/R/R- good air movement GI: soft, NT, ND, (+)BS- normoactive Psychiatric: appropriate, cooperative Neurological: Ox3 Moving all 4 extremities in bed Skin: Protective dressing on heels been removed, no open wounds noted  Prior exam Neuro: Alert and oriented x 4, follows commands, cranial nerves II through XII intact, voice is a little hoarse, memory intact, fair insight Sensation intact to light touch in all 4 extremities however decreased in stocking glove type distribution below her knees-she reports this is chronic RUE: 4-/5 Deltoid, 4/5 Biceps, 4/5 Triceps, 4/5 Wrist Ext, 4/5 Grip, intrinsic hand muscles/fifth digit abduction 1 out of 5 LUE: 4/5 Deltoid, 4/5 Biceps, 4/5 Triceps, 4/5 Wrist Ext, 4/5 Grip, intrinsic hand muscles/fifth digit abduction 3 out of 5 RLE: HF  2/5, KE 3/5, ADF 1/5, APF 2-3/5 LLE: HF 4/5, KE 4/5,  ADF 4/5, APF 4/5 No ankle clonus Musculoskeletal:  Decreased C-spine ROM Tender over right greater trochanter, pelvis Pain with ROM of right lower extremity  Assessment/Plan: 1. Functional deficits which require 3+ hours per day of interdisciplinary therapy in a comprehensive inpatient rehab setting. Physiatrist is providing close team supervision and 24 hour management of active medical problems listed below. Physiatrist and rehab team continue to assess barriers to discharge/monitor patient progress toward functional and medical goals  Care Tool:  Bathing    Body parts bathed by patient: Right arm, Left arm, Chest, Abdomen, Front perineal area, Buttocks, Right upper leg, Left upper leg, Face   Body parts bathed by helper: Right lower leg, Left lower leg     Bathing assist Assist Level: Minimal Assistance - Patient > 75%     Upper Body Dressing/Undressing Upper body dressing   What is the patient wearing?: Pull over shirt    Upper body assist Assist Level: Minimal Assistance - Patient > 75%    Lower Body Dressing/Undressing Lower body dressing  What is the patient wearing?: Incontinence brief, Pants     Lower body assist Assist for lower body dressing: Maximal Assistance - Patient 25 - 49%     Toileting Toileting    Toileting assist Assist for toileting: Moderate Assistance - Patient 50 - 74%     Transfers Chair/bed transfer  Transfers assist     Chair/bed transfer assist level: Minimal Assistance - Patient > 75%     Locomotion Ambulation   Ambulation assist   Ambulation activity did not occur: Safety/medical concerns  Assist level: Minimal Assistance - Patient > 75% Assistive device: Walker-rolling Max distance: 25   Walk 10 feet activity   Assist  Walk 10 feet activity did not occur: Safety/medical concerns  Assist level: Minimal Assistance - Patient > 75% Assistive device:  Walker-rolling   Walk 50 feet activity   Assist Walk 50 feet with 2 turns activity did not occur: Safety/medical concerns         Walk 150 feet activity   Assist Walk 150 feet activity did not occur: Safety/medical concerns         Walk 10 feet on uneven surface  activity   Assist Walk 10 feet on uneven surfaces activity did not occur: Safety/medical concerns         Wheelchair     Assist Is the patient using a wheelchair?: Yes Type of Wheelchair: Manual Wheelchair activity did not occur: Safety/medical concerns         Wheelchair 50 feet with 2 turns activity    Assist    Wheelchair 50 feet with 2 turns activity did not occur: Safety/medical concerns       Wheelchair 150 feet activity     Assist  Wheelchair 150 feet activity did not occur: Safety/medical concerns       Blood pressure 116/66, pulse 88, temperature 98.1 F (36.7 C), temperature source Oral, resp. rate 17, height 5\' 7"  (1.702 m), weight 57.9 kg, SpO2 97%.  Medical Problem List and Plan: 1. Functional deficits secondary to incomplete quadriplegia- ASIA D after fall with history of longstanding cervical stenosis             -patient may  shower             -ELOS/Goals: 12 to 16 days, PT/OT supervision to mod I, SLP N/A             -Admit to CIR   Con't CIR PT and OT  Limited by quadriparesis- pt reports occurred at work- so thinks it's worker's comp- but not filed under that.   -Prevalon boots ordered for heel protection 2.  Antithrombotics: -DVT/anticoagulation:  Pharmaceutical: Lovenox             -antiplatelet therapy: none   3. Pain Management: Tylenol, Percocet as needed             -continue gabapentin 200 mg twice daily   10/17- no complaints of pain- con't regimen   10/19-reports pain in her right arm, right hip and right foot.  Overall under control with current medications   10/20 reports pain is overall under control, continue current regimen  4.  Mood/Behavior/Sleep: LCSW to evaluate and provide emotional support             -antipsychotic agents: n/a   5. Neuropsych/cognition: This patient is capable of making decisions on her own behalf.   6. Skin/Wound Care: Routine skin care checks   7. Fluids/Electrolytes/Nutrition: Routine Is and Os and follow-up chemistries             --  D3 diet, thin liquids             -lactose intolerant: continue Lactaid   8: Hypothyroidism with history of multinodular goiter s/p total thyroidectomy: continue Synthroid.  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  Hemoglobin A1c was 10.3 in 2014             -per DC: "Patient may not need any oral meds @ discharge home along with her Mounjaro."    10/17- pt's CBGs 115-173 in last 24 hours- will monitor  10/18- overall controlled- con't regimen  -10/ 20 CBGs well-controlled, continue to monitor  CBG (last 3)  Recent Labs    04/25/23 2119 04/26/23 0624 04/26/23 1134  GLUCAP 123* 108* 125*    11: Orthostatic hypotension: place TED hose and abd binder.  Improved after fluids given   10/17- will probably be an issue today- not drinking well  10/18-BP running 90's to 100s systolic- if has orthostasis- will need Midodrine   10/ 20 BP controlled, continue to monitor with activity     04/26/2023    2:45 PM 04/26/2023    3:32 AM 04/25/2023    7:41 PM  Vitals with BMI  Systolic 116 110 914  Diastolic 66 71 52  Pulse 88 81 86    12: Normocytic anemia, chronic: follow-up CBC   13: GI prophylaxis: continue PPI   14: SCI: continue cervical collar when out of bed or transporting               -bladder scan/check PVRs    10/17- her RLE ankle DF/PF is stronger than seen yesterday - and LLE stable at 3-/5- will wait on PRAFOs. Also doesn't appear to need [facility] 15: Right superior and inferior pubic rami fractures: Weightbearing as  tolerated   16: Right arm pain: X-ray on 10/13 negative   17: Constipation vs neurogenic bowel: Give sorbitol 60 cc now>>enema if no results   18. CKD IIIA.  Recheck labs.  Avoid nephrotoxic medications   10/17-BUN up to 1.39 from 1.28 however BUN better at 28- down from 37- will push fluids- write order and recheck in AM- will also check CMP weekly. 10/9 creatinine/BUN stable at 1.24/30 19. Neurogenic bowel?  10/17- LBM last night after fleets enema- but had been >5 days since LBM- will monitor and see if needs bowel program  10/ 20 add Senokot at bedtime 20. Neurogenic bladder?  10/17- having a lot of incontinent voids- will check bladder scans to see if due to overflow and is retaining- have ordered  10/18- 2 bladder scans- 0 and 12ml- will con't for now- but her incontinence has resolved it appears, so doing better  -10/ 20 continent, bladder scans not elevated    LOS: 4 days A FACE TO FACE EVALUATION WAS PERFORMED  Fanny Dance 04/26/2023, 4:28 PM

## 2023-04-27 DIAGNOSIS — G8250 Quadriplegia, unspecified: Secondary | ICD-10-CM | POA: Diagnosis not present

## 2023-04-27 LAB — COMPREHENSIVE METABOLIC PANEL
ALT: 16 U/L (ref 0–44)
AST: 22 U/L (ref 15–41)
Albumin: 2.9 g/dL — ABNORMAL LOW (ref 3.5–5.0)
Alkaline Phosphatase: 115 U/L (ref 38–126)
Anion gap: 11 (ref 5–15)
BUN: 29 mg/dL — ABNORMAL HIGH (ref 8–23)
CO2: 22 mmol/L (ref 22–32)
Calcium: 8.9 mg/dL (ref 8.9–10.3)
Chloride: 104 mmol/L (ref 98–111)
Creatinine, Ser: 1.21 mg/dL — ABNORMAL HIGH (ref 0.44–1.00)
GFR, Estimated: 50 mL/min — ABNORMAL LOW (ref >60)
Glucose, Bld: 130 mg/dL — ABNORMAL HIGH (ref 70–99)
Potassium: 4.7 mmol/L (ref 3.5–5.1)
Sodium: 137 mmol/L (ref 135–145)
Total Bilirubin: 0.5 mg/dL (ref 0.3–1.2)
Total Protein: 6.7 g/dL (ref 6.5–8.1)

## 2023-04-27 LAB — CBC
HCT: 25.6 % — ABNORMAL LOW (ref 36.0–46.0)
Hemoglobin: 8.5 g/dL — ABNORMAL LOW (ref 12.0–15.0)
MCH: 28.6 pg (ref 26.0–34.0)
MCHC: 33.2 g/dL (ref 30.0–36.0)
MCV: 86.2 fL (ref 80.0–100.0)
Platelets: 253 10*3/uL (ref 150–400)
RBC: 2.97 MIL/uL — ABNORMAL LOW (ref 3.87–5.11)
RDW: 14.1 % (ref 11.5–15.5)
WBC: 7.8 10*3/uL (ref 4.0–10.5)
nRBC: 0 % (ref 0.0–0.2)

## 2023-04-27 LAB — GLUCOSE, CAPILLARY
Glucose-Capillary: 118 mg/dL — ABNORMAL HIGH (ref 70–99)
Glucose-Capillary: 159 mg/dL — ABNORMAL HIGH (ref 70–99)
Glucose-Capillary: 172 mg/dL — ABNORMAL HIGH (ref 70–99)

## 2023-04-27 MED ORDER — BACLOFEN 5 MG HALF TABLET
5.0000 mg | ORAL_TABLET | Freq: Three times a day (TID) | ORAL | Status: DC
  Administered 2023-04-27 – 2023-04-28 (×6): 5 mg via ORAL
  Filled 2023-04-27 (×6): qty 1

## 2023-04-27 MED ORDER — DEXTROSE-SODIUM CHLORIDE 5-0.45 % IV SOLN: INTRAVENOUS | Status: AC

## 2023-04-27 NOTE — Progress Notes (Signed)
Occupational Therapy Session Note  Patient Details  Name: [name] MRN: [id number] Date of Birth: 1955-09-11  Today's Date: 04/27/2023 OT Individual Time: 1400-1430 OT Individual Time Calculation (min): 30 min    Short Term Goals: Week 1:  OT Short Term Goal 1 (Week 1): Patient to perform toilet transfer with Min assist OT Short Term Goal 2 (Week 1): Patient to perform LB dressing with AE as needed with Min assist OT Short Term Goal 3 (Week 1): Patient to perform bathing with AE as needed with Min assist OT Short Term Goal 4 (Week 1): Patient to perform grooming with set up assist sitting at sink  Skilled Therapeutic Interventions/Progress Updates:    Pt resting in recliner upon arrival. Pt ate scant amount of lunch. Emesis container with musous-like emesis. Secure chat to team explaining pt's difficulty with swallowing. Educated pt on importance of nutrition. Pt requested to use toilet. Amb with RW to bathroom with CGA. Toileting with CGA. Pt requested to return to bed anb amb with RW to EOB. Sit>supine with supervision. Pt remained in bed with all needs within reach. Bed alarm activated.   Therapy Documentation Precautions:  Precautions Precautions: Cervical, Fall Precaution Comments: verbally reviewed brace wear Required Braces or Orthoses: Cervical Brace Cervical Brace: Hard collar Restrictions Weight Bearing Restrictions: Yes RLE Weight Bearing: Weight bearing as tolerated LLE Weight Bearing: Weight bearing as tolerated Other Position/Activity Restrictions: per orders, ok to donn collar edge of bed, amb to bathroom/shower without c collar    Pain:   Pt reports increased "stinging" in Lt heel; repositioned  Therapy/Group: Individual Therapy  Rich Brave 04/27/2023, 2:38 PM

## 2023-04-27 NOTE — Progress Notes (Signed)
Physical Therapy Session Note  Patient Details  Name: [name] MRN: [id number] Date of Birth: 06-23-1956  Today's Date: 04/27/2023 PT Individual Time: 1133-1202 PT Individual Time Calculation (min): 29 min   Short Term Goals: Week 1:  PT Short Term Goal 1 (Week 1): Pt will tolerate sitting upright OOB >4 hours PT Short Term Goal 2 (Week 1): Pt will tolerate standing more than 2 min without changes in vital signs PT Short Term Goal 3 (Week 1): Pt will ambulate >30 ft with LRAD  Skilled Therapeutic Interventions/Progress Updates:    Pt on phone on arrival doing paperwork, pt missed x 16 min, will make up as able. Pt requesting to use bathroom, bed mobility with CGA and increased time d/t pain. Unrated pain in hip, premedicated. Rest and positioning provided as needed. ambulatory transfer to bathroom with CGA, continent bladder void with CGA-supervision for 3/3 toileting tasks. Pt then ambulated to chair, was left with all needs in reach and alarm active.   Therapy Documentation Precautions:  Precautions Precautions: Cervical, Fall Precaution Comments: verbally reviewed brace wear Required Braces or Orthoses: Cervical Brace Cervical Brace: Hard collar Restrictions Weight Bearing Restrictions: Yes RLE Weight Bearing: Weight bearing as tolerated LLE Weight Bearing: Weight bearing as tolerated Other Position/Activity Restrictions: per orders, ok to donn collar edge of bed, amb to bathroom/shower without c collar General: PT Amount of Missed Time (min): 16 Minutes PT Missed Treatment Reason: Other (Comment) (pt on phone call)    Therapy/Group: Individual Therapy  Juluis Rainier 04/27/2023, 12:34 PM

## 2023-04-27 NOTE — Progress Notes (Signed)
PROGRESS NOTE   Subjective/Complaints:   Pt reports receive, or was supposed to receive a "boot" for her R foot to protect her heel.  Doesn't like foam dressings.  Didn't eat because "Cannot tolerate cold food" Didn't eat much today- hates water- just wants her Pepsi.   Said developing stiffness- and painful to reach as a result in mainly RUE >LUE.        ROS:   Pt denies SOB, abd pain, CP, N/V/C/D, and vision changes   Except for HPI  Objective:   No results found. Recent Labs    04/27/23 0558  WBC 7.8  HGB 8.5*  HCT 25.6*  PLT 253    Recent Labs    04/27/23 0558  NA 137  K 4.7  CL 104  CO2 22  GLUCOSE 130*  BUN 29*  CREATININE 1.21*  CALCIUM 8.9    Intake/Output Summary (Last 24 hours) at 04/27/2023 1935 Last data filed at 04/27/2023 1408 Gross per 24 hour  Intake 340 ml  Output --  Net 340 ml        Physical Exam: Vital Signs Blood pressure 110/61, pulse (!) 107, temperature 98.4 F (36.9 C), temperature source Oral, resp. rate 17, height 5\' 7"  (1.702 m), weight 58.1 kg, SpO2 100%.      General: awake, alert, appropriate, supine in bed- doesn't want to sit up;  NAD HENT: conjugate gaze; oropharynx dry CV: regular  rhythm, tachycardic; no JVD Pulmonary: CTA B/L; no W/R/R- good air movement GI: soft, NT, ND, (+)BS Psychiatric: appropriate Neurological: sleepy- doesn't want to sit up- food next to her- not eaten - MAS of 1 in Ue's with hoffman's (+) B/L Moving all 4 extremities in bed Skin: Protective dressing on heels been removed, no open wounds noted  Prior exam Neuro: Alert and oriented x 4, follows commands, cranial nerves II through XII intact, voice is a little hoarse, memory intact, fair insight Sensation intact to light touch in all 4 extremities however decreased in stocking glove type distribution below her knees-she reports this is chronic RUE: 4-/5 Deltoid, 4/5 Biceps,  4/5 Triceps, 4/5 Wrist Ext, 4/5 Grip, intrinsic hand muscles/fifth digit abduction 1 out of 5 LUE: 4/5 Deltoid, 4/5 Biceps, 4/5 Triceps, 4/5 Wrist Ext, 4/5 Grip, intrinsic hand muscles/fifth digit abduction 3 out of 5 RLE: HF 2/5, KE 3/5, ADF 1/5, APF 2-3/5 LLE: HF 4/5, KE 4/5,  ADF 4/5, APF 4/5 No ankle clonus Musculoskeletal:  Decreased C-spine ROM Tender over right greater trochanter, pelvis Pain with ROM of right lower extremity  Assessment/Plan: 1. Functional deficits which require 3+ hours per day of interdisciplinary therapy in a comprehensive inpatient rehab setting. Physiatrist is providing close team supervision and 24 hour management of active medical problems listed below. Physiatrist and rehab team continue to assess barriers to discharge/monitor patient progress toward functional and medical goals  Care Tool:  Bathing    Body parts bathed by patient: Right arm, Left arm, Chest, Abdomen, Front perineal area, Buttocks, Right upper leg, Left upper leg, Face   Body parts bathed by helper: Right lower leg, Left lower leg     Bathing assist Assist Level: Minimal Assistance - Patient >  75%     Upper Body Dressing/Undressing Upper body dressing   What is the patient wearing?: Pull over shirt    Upper body assist Assist Level: Supervision/Verbal cueing    Lower Body Dressing/Undressing Lower body dressing      What is the patient wearing?: Incontinence brief, Pants     Lower body assist Assist for lower body dressing: Moderate Assistance - Patient 50 - 74%     Toileting Toileting    Toileting assist Assist for toileting: Minimal Assistance - Patient > 75%     Transfers Chair/bed transfer  Transfers assist     Chair/bed transfer assist level: Minimal Assistance - Patient > 75%     Locomotion Ambulation   Ambulation assist   Ambulation activity did not occur: Safety/medical concerns  Assist level: Minimal Assistance - Patient > 75% Assistive  device: Walker-rolling Max distance: 25   Walk 10 feet activity   Assist  Walk 10 feet activity did not occur: Safety/medical concerns  Assist level: Minimal Assistance - Patient > 75% Assistive device: Walker-rolling   Walk 50 feet activity   Assist Walk 50 feet with 2 turns activity did not occur: Safety/medical concerns         Walk 150 feet activity   Assist Walk 150 feet activity did not occur: Safety/medical concerns         Walk 10 feet on uneven surface  activity   Assist Walk 10 feet on uneven surfaces activity did not occur: Safety/medical concerns         Wheelchair     Assist Is the patient using a wheelchair?: Yes Type of Wheelchair: Manual Wheelchair activity did not occur: Safety/medical concerns         Wheelchair 50 feet with 2 turns activity    Assist    Wheelchair 50 feet with 2 turns activity did not occur: Safety/medical concerns       Wheelchair 150 feet activity     Assist  Wheelchair 150 feet activity did not occur: Safety/medical concerns       Blood pressure 110/61, pulse (!) 107, temperature 98.4 F (36.9 C), temperature source Oral, resp. rate 17, height 5\' 7"  (1.702 m), weight 58.1 kg, SpO2 100%.  Medical Problem List and Plan: 1. Functional deficits secondary to incomplete quadriplegia- ASIA D after fall with history of longstanding cervical stenosis             -patient may  shower             -ELOS/Goals: 12 to 16 days, PT/OT supervision to mod I, SLP N/A             -Admit to CIR   Con't CIR PT and OT- more sedated this afternoon- started Baclofen 5 mg TID today- will see if it's just baclofen or something else- will recheck labs in AM  Limited by quadriparesis- pt reports occurred at work- so thinks it's worker's comp- but not filed under that.   -Prevalon boots ordered for heel protection 2.  Antithrombotics: -DVT/anticoagulation:  Pharmaceutical: Lovenox             -antiplatelet therapy:  none   3. Pain Management: Tylenol, Percocet as needed             -continue gabapentin 200 mg twice daily   10/17- no complaints of pain- con't regimen   10/19-reports pain in her right arm, right hip and right foot.  Overall under control with current medications   10/20  reports pain is overall under control, continue current regimen  4. Mood/Behavior/Sleep: LCSW to evaluate and provide emotional support             -antipsychotic agents: n/a   5. Neuropsych/cognition: This patient is capable of making decisions on her own behalf.   6. Skin/Wound Care: Routine skin care checks   7. Fluids/Electrolytes/Nutrition: Routine Is and Os and follow-up chemistries             --D3 diet, thin liquids             -lactose intolerant: continue Lactaid   8: Hypothyroidism with history of multinodular goiter s/p total thyroidectomy: continue Synthroid.  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  Hemoglobin A1c was 10.3 in 2014             -per DC: "Patient may not need any oral meds @ discharge home along with her Mounjaro."    10/17- pt's CBGs 115-173 in last 24 hours- will monitor  10/18- overall controlled- con't regimen  -10/ 20- 10/21 CBGs well-controlled, continue to monitor  CBG (last 3)  Recent Labs    04/26/23 2104 04/27/23 1200 04/27/23 1633  GLUCAP 133* 118* 159*    11: Orthostatic hypotension: place TED hose and abd binder.  Improved after fluids given   10/17- will probably be an issue today- not drinking well  10/18-BP running 90's to 100s systolic- if has orthostasis- will need Midodrine   10/ 20 BP controlled, continue to monitor with activity  10/21- HR 107- and BP on low side- will monitor    04/27/2023    3:15 PM 04/27/2023    5:22 AM 04/27/2023    5:00 AM  Vitals with BMI  Weight   128 lbs 1 oz  BMI   20.06  Systolic 110 115   Diastolic 61 62    Pulse 107 94     12: Normocytic anemia, chronic: follow-up CBC   13: GI prophylaxis: continue PPI   14: SCI: continue cervical collar when out of bed or transporting               -bladder scan/check PVRs    10/17- her RLE ankle DF/PF is stronger than seen yesterday - and LLE stable at 3-/5- will wait on PRAFOs. Also doesn't appear to need [facility] 15: Right superior and inferior pubic rami fractures: Weightbearing as tolerated   16: Right arm pain: X-ray on 10/13 negative   17: Constipation vs neurogenic bowel: Give sorbitol 60 cc now>>enema if no results   18. CKD IIIA.  Recheck labs.  Avoid nephrotoxic medications   10/17-BUN up to 1.39 from 1.28 however BUN better at 28- down from 37- will push fluids- write order and recheck in AM- will also check CMP weekly. 10/9 creatinine/BUN stable at 1.24/30 10/21- Cr 1.21 and BUN 29- stable for her- pepsi drinker 19. Neurogenic bowel?  10/17- LBM last night after fleets enema- but had been >5 days since LBM- will monitor and see if needs bowel program  10/ 20 add Senokot at bedtime 20. Neurogenic bladder?  10/17- having a lot of incontinent voids- will check bladder scans to see if due to overflow and is retaining- have ordered  10/18- 2 bladder scans- 0 and 12ml- will con't for now- but her incontinence has resolved it appears, so doing better  -10/ 20  continent, bladder scans not elevated 21. Sedation  10/21- might be baclofen we started- will monitor 22. Spasticity-   10/21- started Baclofen 5 mg TID for this- might be contributing to #21- -also added [facility] PRAFO order for RLE   I spent a total of  43  minutes on total care today- >50% coordination of care- due to  D/w PA as wel as nursing about sedation- received 10 hours of IVFs and will recheck labs in AM    LOS: 5 days A FACE TO FACE EVALUATION WAS PERFORMED  Ranard Harte 04/27/2023, 7:35 PM

## 2023-04-27 NOTE — Progress Notes (Signed)
Therapist reports emesis versus phlegm production and change in speech pattern. Seen at bedside with PT (start of last session this afternoon). Primary complaint is fatigue and lack of appetite. Waiting for husband to bring her Pepsi-Cola. + void today, some phlegm. No vomiting. Her speech is at baseline but slower. She is acceptable to proceed with PT session. IVFs: D5 1/2 NS at 100 cc/hr for 10 hours ordered.

## 2023-04-27 NOTE — Progress Notes (Signed)
Occupational Therapy Session Note  Patient Details  Name: Linzy Alexandra MRN: [id number] Date of Birth: [birth year]  Today's Date: 04/27/2023 OT Individual Time: 4098-1191 OT Individual Time Calculation (min): 75 min    Short Term Goals: Week 1:  OT Short Term Goal 1 (Week 1): Patient to perform toilet transfer with Min assist OT Short Term Goal 2 (Week 1): Patient to perform LB dressing with AE as needed with Min assist OT Short Term Goal 3 (Week 1): Patient to perform bathing with AE as needed with Min assist OT Short Term Goal 4 (Week 1): Patient to perform grooming with set up assist sitting at sink  Skilled Therapeutic Interventions/Progress Updates:    Pt resting in bed upon arrival and agreeable to therapy. Pt barely ate breakfast. Pt with muscous-like emesis X 2. Pt reports difficulty swallowing. OT intervention with focus on bed mobility, sitting balance, dressing with sit<>stand from EOB, functional amb with RW, and toileting to increase independence with BADLs. Supne>sit EOB with min A. UB dressing with supervision. LB dressing with mod A sit<>stand from EOB. Amb with RW to bathroom with CGA. Toileting tasks with mod A. Pt requires more then a reasonable amount of time to complete tasks. Sit>supine with supervisoin. Pt able to reposition in bed wihtout assistance. Pt remained in bed with all needs within reach. Bed alarm activated.   Therapy Documentation Precautions:  Precautions Precautions: Cervical, Fall Precaution Comments: verbally reviewed brace wear Required Braces or Orthoses: Cervical Brace Cervical Brace: Hard collar Restrictions Weight Bearing Restrictions: Yes RLE Weight Bearing: Weight bearing as tolerated LLE Weight Bearing: Weight bearing as tolerated Other Position/Activity Restrictions: per orders, ok to donn collar edge of bed, amb to bathroom/shower without c collar   Pain:  Pt reports Lt heel tender; MD and nursing aware;  repositioned   Therapy/Group: Individual Therapy  Rich Brave 04/27/2023, 2:40 PM

## 2023-04-27 NOTE — Progress Notes (Signed)
Physical Therapy Session Note  Patient Details  Name: [name] MRN: [id number] Date of Birth: 1956/01/01  Today's Date: 04/27/2023 PT Individual Time: 4098-1191 PT Individual Time Calculation (min): 45 min   Short Term Goals: Week 1:  PT Short Term Goal 1 (Week 1): Pt will tolerate sitting upright OOB >4 hours PT Short Term Goal 2 (Week 1): Pt will tolerate standing more than 2 min without changes in vital signs PT Short Term Goal 3 (Week 1): Pt will ambulate >30 ft with LRAD  Skilled Therapeutic Interventions/Progress Updates:      Therapy Documentation Precautions:  Precautions Precautions: Cervical, Fall Precaution Comments: verbally reviewed brace wear Required Braces or Orthoses: Cervical Brace Cervical Brace: Hard collar Restrictions Weight Bearing Restrictions: Yes RLE Weight Bearing: Weight bearing as tolerated LLE Weight Bearing: Weight bearing as tolerated Other Position/Activity Restrictions: per orders, ok to donn collar edge of bed, amb to bathroom/shower without c collar  Pt received resting and agreeable to PT with encouragement. PA present at start of session and encouraged pt to increase intake of food and fluids. Pt also reports right heel pain, removed ace wraps/ted hose at end of session and nurse notified. Patient required increased time for initiation, cuing, rest breaks, and for completion of tasks throughout session. Utilized therapeutic use of self throughout to promote efficiency. Pt requires min A with supine to sit and supervision for static and dynamic sitting balance ~20 minutes with BP WNL ( 110/61). Pt engaged in feeding and drinking fluids in session and required CGA with sit<>stand and right lateral steps toward head of bed to return to supine with min A. Pt left semi-reclined in bed with all needs in reach and nurse present.      Therapy/Group: Individual Therapy  Truitt Leep Truitt Leep PT, DPT  04/27/2023, 7:56 AM

## 2023-04-28 DIAGNOSIS — G8250 Quadriplegia, unspecified: Secondary | ICD-10-CM | POA: Diagnosis not present

## 2023-04-28 LAB — BASIC METABOLIC PANEL
Anion gap: 13 (ref 5–15)
BUN: 25 mg/dL — ABNORMAL HIGH (ref 8–23)
CO2: 25 mmol/L (ref 22–32)
Calcium: 9.4 mg/dL (ref 8.9–10.3)
Chloride: 100 mmol/L (ref 98–111)
Creatinine, Ser: 1.27 mg/dL — ABNORMAL HIGH (ref 0.44–1.00)
GFR, Estimated: 47 mL/min — ABNORMAL LOW (ref >60)
Glucose, Bld: 179 mg/dL — ABNORMAL HIGH (ref 70–99)
Potassium: 4.5 mmol/L (ref 3.5–5.1)
Sodium: 138 mmol/L (ref 135–145)

## 2023-04-28 LAB — GLUCOSE, CAPILLARY
Glucose-Capillary: 164 mg/dL — ABNORMAL HIGH (ref 70–99)
Glucose-Capillary: 139 mg/dL — ABNORMAL HIGH (ref 70–99)
Glucose-Capillary: 140 mg/dL — ABNORMAL HIGH (ref 70–99)
Glucose-Capillary: 145 mg/dL — ABNORMAL HIGH (ref 70–99)

## 2023-04-28 MED ORDER — MIDODRINE HCL 5 MG PO TABS
5.0000 mg | ORAL_TABLET | Freq: Three times a day (TID) | ORAL | Status: DC
  Administered 2023-04-28 – 2023-05-04 (×18): 5 mg via ORAL
  Filled 2023-04-28 (×19): qty 1

## 2023-04-28 NOTE — Progress Notes (Signed)
Physical Therapy Session Note  Patient Details  Name: [name] MRN: [id number] Date of Birth: [birth year]  Today's Date: 04/28/2023 PT Individual Time: 0800-0900 PT Individual Time Calculation (min): 60 min   Short Term Goals: Week 1:  PT Short Term Goal 1 (Week 1): Pt will tolerate sitting upright OOB >4 hours PT Short Term Goal 2 (Week 1): Pt will tolerate standing more than 2 min without changes in vital signs PT Short Term Goal 3 (Week 1): Pt will ambulate >30 ft with LRAD  Skilled Therapeutic Interventions/Progress Updates:    pt received in bed and agreeable to therapy. Pt sleepy on arrival, opened blinds and encouraged upright posture to improve arousal.   Donned ted hose with tot a, pants with max a. Bed mobility with supervision and increased time. Pt known to move slowly d/t pain, but this therapist feels pt moving slower than normal.   ambulatory transfer to bathroom with CGA with RW. Continent B+B void, supervision seated hygiene. While seated on toilet, pt seems to demo s/s of orthostasis, but decreased verbal responses. Likely related to valsalva while toileting. Elevated feet until symptoms improved. Pt able to walk to sink and then back to bed, but required greatly increased time.   Pt returned to bed and was positioned with feet elevated and encouraged to eat breakfast. Pt was left with all needs in reach and alarm active.   Therapy Documentation Precautions:  Precautions Precautions: Cervical, Fall Precaution Comments: verbally reviewed brace wear Required Braces or Orthoses: Cervical Brace Cervical Brace: Hard collar Restrictions Weight Bearing Restrictions: Yes RLE Weight Bearing: Weight bearing as tolerated LLE Weight Bearing: Weight bearing as tolerated Other Position/Activity Restrictions: per orders, ok to donn collar edge of bed, amb to bathroom/shower without c collar General:       Therapy/Group: Individual Therapy  Juluis Rainier 04/28/2023, 12:23 PM

## 2023-04-28 NOTE — Evaluation (Signed)
Speech Language Pathology Assessment and Plan  Patient Details  Name: [name] MRN: [id number] Date of Birth: [birth year]  SLP Diagnosis: Dysphagia  Rehab Potential: Good ELOS: 11/1    Today's Date: 04/28/2023 SLP Individual Time: 1100-1200 SLP Individual Time Calculation (min): 60 min   Hospital Problem: Principal Problem:   Acute incomplete quadriplegia (HCC)  Past Medical History:  Past Medical History:  Diagnosis Date   Arthritis    Blood dyscrasia    patient stated she is a "free bleeder"   Diabetes mellitus without complication (HCC)    Diverticulosis    Hypothyroidism    Neuropathy    Renal insufficiency    Past Surgical History:  Past Surgical History:  Procedure Laterality Date   ABDOMINAL HYSTERECTOMY     BILATERAL SALPINGECTOMY Bilateral 03/16/2017   Procedure: BILATERAL SALPINGECTOMY;  Surgeon: Schermerhorn, Ihor Austin, MD;  Location: [facility] ORS;  Service: Gynecology;  Laterality: Bilateral;   COLONOSCOPY WITH PROPOFOL N/A 08/18/2018   Procedure: COLONOSCOPY WITH PROPOFOL;  Surgeon: Scot Jun, MD;  Location: [facility] ENDOSCOPY;  Service: Endoscopy;  Laterality: N/A;   CYSTOCELE REPAIR N/A 03/16/2017   Procedure: ANTERIOR REPAIR (CYSTOCELE);  Surgeon: Schermerhorn, Ihor Austin, MD;  Location: [facility] ORS;  Service: Gynecology;  Laterality: N/A;   ESOPHAGOGASTRODUODENOSCOPY (EGD) WITH PROPOFOL N/A 08/18/2018   Procedure: ESOPHAGOGASTRODUODENOSCOPY (EGD) WITH PROPOFOL;  Surgeon: Scot Jun, MD;  Location: [facility] ENDOSCOPY;  Service: Endoscopy;  Laterality: N/A;   TEAR DUCT PROBING Right    TUBAL LIGATION     VAGINAL HYSTERECTOMY N/A 03/16/2017   Procedure: HYSTERECTOMY VAGINAL;  Surgeon: Schermerhorn, Ihor Austin, MD;  Location: [facility] ORS;  Service: Gynecology;  Laterality: N/A;   WISDOM TOOTH EXTRACTION      Assessment / Plan / Recommendation Clinical Impression Pt is a 67 y/o female with PMH of arthritis, BM, diverticulosis, hypothyroidism, neuropathy, and  renal insufficiency admitted to [facility] on 10/7 following a backwards fall with cord injury and pelvic fractures.  In ED pt with CBG 61, xray revealed right superior/inferior pubic rami fractures, and MRI showed possible cervical spinal cord injury with severe stenosis at C2-4 due to endplate osteophytes and disc bulging.  AP diameter of the canal in the middle as narrow as 4.5 mm.  Neurosurgery was consulted, and recommendations were for acute vs delayed surgical intervention and pt/family opted for the latter.  She was admitted to the ICU for pressor support to maintain MAP greater than 85 for 48 hours. Hospital course complicated by orthostatic hypotension, treated with IV hydration. Therapy evaluations completed and pt was recommended for CIR.   Patient presents with positive clinical symptoms of potential esophageal dysphagia as bolstered by history of appointment with outpatient GI, resulting in recommendations for further testing of esophageal function.  SLP noted mildly impaired oral strength and imprecise articulation at times at the conversation level, though speech was 100% intelligible throughout. Suspect majority of speech errors were related to dentures. SLP assessed patient's oropharyngeal tolerance of thin liquids and regular solids. With thin liquids, patient exhibited no overt s/sx of penetration/aspiration but did exhibit eructation after each swallow, though delayed. With regular solid, patient exhibited prolonged mastication and delayed oral transit, also reporting globus sensation at the level of the upper sternum. Recommend continuation of Dys3/thin liquid diet, though recommend MBS to objectively view oropharyngeal swallowing deficits and screen for true GI need. Notably, patient reports thyroid surgery earlier this year, which resulted in chronic sore throat and changes to pharyngeal sensation that have not ceased. SLP discussed  potential benefit of seeing ENT for follow up after discharge to  address these symptoms. Patient is agreeable to plan.   Patient left upright in bed with bed alarm set and all needs within reach.   Skilled Therapeutic Interventions          SLP conducted skilled evaluation session to assess swallowing function via bedside swallow evaluation. Full results above.    SLP Assessment  Patient will need skilled Speech Lanaguage Pathology Services during CIR admission    Recommendations  Recommended Consults: Consider GI evaluation;Consider esophageal assessment;Consider ENT evaluation SLP Diet Recommendations: Dysphagia 3 (Mech soft);Thin Liquid Administration via: Straw;Cup Medication Administration: Whole meds with liquid Supervision: Patient able to self feed Compensations: Minimize environmental distractions;Small sips/bites;Slow rate Postural Changes and/or Swallow Maneuvers: Seated upright 90 degrees;Upright 30-60 min after meal Oral Care Recommendations: Oral care BID Recommendations for Other Services: Neuropsych consult Patient destination: Home Follow up Recommendations: None Equipment Recommended: None recommended by SLP    SLP Frequency 1 to 3 out of 7 days   SLP Duration  SLP Intensity  SLP Treatment/Interventions 11/1  Minumum of 1-2 x/day, 30 to 90 minutes  Dysphagia/aspiration precaution training;Patient/family education    Pain Pain Assessment Pain Scale: Faces Pain Score: 4  Faces Pain Scale: No hurt Pain Type: Acute pain Pain Location: Heel Pain Orientation: Right Pain Descriptors / Indicators: Discomfort;Burning Pain Frequency: Constant Pain Onset: On-going Pain Intervention(s): Elevated extremity  Prior Functioning Cognitive/Linguistic Baseline: Within functional limits Type of Home: Apartment  Lives With: Spouse;Son Available Help at Discharge: Family Vocation: Full time employment  SLP Evaluation Cognition Orientation Level: Oriented X4   Oral Motor Oral Motor/Sensory Function Overall Oral Motor/Sensory  Function: Mild impairment Facial ROM: Within Functional Limits Facial Symmetry: Within Functional Limits Facial Strength: Reduced right;Reduced left Facial Sensation: Within Functional Limits Lingual ROM: Within Functional Limits Lingual Symmetry: Within Functional Limits;Abnormal symmetry left Lingual Strength: Reduced Lingual Sensation: Within Functional Limits Velum: Within Functional Limits Mandible: Within Functional Limits  Care Tool Care Tool Cognition Ability to hear (with hearing aid or hearing appliances if normally used Ability to hear (with hearing aid or hearing appliances if normally used): 0. Adequate - no difficulty in normal conservation, social interaction, listening to TV   Expression of Ideas and Wants Expression of Ideas and Wants: 4. Without difficulty (complex and basic) - expresses complex messages without difficulty and with speech that is clear and easy to understand   Understanding Verbal and Non-Verbal Content Understanding Verbal and Non-Verbal Content: 4. Understands (complex and basic) - clear comprehension without cues or repetitions  Memory/Recall Ability Memory/Recall Ability : Current season;That he or she is in a hospital/hospital unit   Bedside Swallowing Assessment General Date of Onset: 04/13/23 Previous Swallow Assessment: none Diet Prior to this Study: Regular;Thin liquids (Level 0) Temperature Spikes Noted: No Respiratory Status: Room air History of Recent Intubation: No Behavior/Cognition: Alert;Cooperative Oral Cavity - Dentition: Dentures, top;Dentures, bottom Vision: Functional for self-feeding Patient Positioning: Upright in bed Baseline Vocal Quality: Normal;Hoarse (intermittently hoarse) Volitional Cough: Weak Volitional Swallow: Able to elicit  Oral Care Assessment Oral Assessment  (WDL): Exceptions to WDL Lips: Symmetrical Teeth: Dentures upper;Dentures lower Tongue: Pink Mucous Membrane(s): Moist;Pink Saliva: Moist, saliva  free flowing Level of Consciousness: Alert Is patient on any of following O2 devices?: None of the above Nutritional status: Dysphagia Oral Assessment Risk : High Risk Ice Chips Ice chips: Not tested Thin Liquid Thin Liquid: Within functional limits Presentation: Self Fed Nectar Thick Nectar Thick Liquid: Not tested  Honey Thick Honey Thick Liquid: Not tested Puree Puree: Not tested Solid Solid: Impaired Presentation: Self Fed Oral Phase Impairments: Poor awareness of bolus;Impaired mastication Oral Phase Functional Implications: Impaired mastication;Prolonged oral transit BSE Assessment Suspected Esophageal Findings Suspected Esophageal Findings: Belching;Globus sensation Risk for Aspiration Impact on safety and function: Mild aspiration risk Other Related Risk Factors: Deconditioning;Lethargy;History of dysphagia;History of GERD;History of esophageal-related issues  Short Term Goals: Week 1: SLP Short Term Goal 1 (Week 1): Patient will participate in objective swallow study to determine need for further SLP services vs. need for involvement of GI for esophageal assessment.  Refer to Care Plan for Long Term Goals  Recommendations for other services: Neuropsych  Discharge Criteria: Patient will be discharged from SLP if patient refuses treatment 3 consecutive times without medical reason, if treatment goals not met, if there is a change in medical status, if patient makes no progress towards goals or if patient is discharged from hospital.  The above assessment, treatment plan, treatment alternatives and goals were discussed and mutually agreed upon: by patient  Jeannie Done, M.A., CCC-SLP   Yetta Barre 04/28/2023, 11:54 AM

## 2023-04-28 NOTE — Progress Notes (Signed)
PROGRESS NOTE   Subjective/Complaints:  Pt reports hadn't received PRAFO- was delivered this afternoon Pt reports still sleepy, but  Slept well- wants to go home, but realizes cannot yet.  Stretching less painful and easier.    Per SLP, think swallowing due to GI issues- GI saw base don chart review and wanted barium swallow and endoscopy.    ROS:   Pt denies SOB, abd pain, CP, N/V/C/D, and vision changes  Spasticity somewhat improved  Except for HPI  Objective:   No results found. Recent Labs    04/27/23 0558  WBC 7.8  HGB 8.5*  HCT 25.6*  PLT 253    Recent Labs    04/27/23 0558 04/28/23 0744  NA 137 138  K 4.7 4.5  CL 104 100  CO2 22 25  GLUCOSE 130* 179*  BUN 29* 25*  CREATININE 1.21* 1.27*  CALCIUM 8.9 9.4    Intake/Output Summary (Last 24 hours) at 04/28/2023 1846 Last data filed at 04/28/2023 1311 Gross per 24 hour  Intake 118 ml  Output 400 ml  Net -282 ml        Physical Exam: Vital Signs Blood pressure 117/68, pulse 100, temperature 98.4 F (36.9 C), temperature source Oral, resp. rate 18, height 5\' 7"  (1.702 m), weight 58.1 kg, SpO2 96%.       General: awake, alert, appropriate, supine in bed;  NAD HENT: conjugate gaze; oropharynx moist CV: regular rhythm, mildly tachycardic ; no JVD Pulmonary: CTA B/L; no W/R/R- good air movement GI: soft, NT, ND, (+)BS Psychiatric: appropriate- flat- still sleepy/lethargic Neurological: Ox3- but less sleepy than yesterday-  - MAS of 1- but less than yesterday in Ue's with hoffman's (+) B/L- however more ROM of arms B/L  Moving all 4 extremities in bed Skin: Protective dressing on heels been removed, no open wounds noted  Prior exam Neuro: Alert and oriented x 4, follows commands, cranial nerves II through XII intact, voice is a little hoarse, memory intact, fair insight Sensation intact to light touch in all 4 extremities however  decreased in stocking glove type distribution below her knees-she reports this is chronic RUE: 4-/5 Deltoid, 4/5 Biceps, 4/5 Triceps, 4/5 Wrist Ext, 4/5 Grip, intrinsic hand muscles/fifth digit abduction 1 out of 5 LUE: 4/5 Deltoid, 4/5 Biceps, 4/5 Triceps, 4/5 Wrist Ext, 4/5 Grip, intrinsic hand muscles/fifth digit abduction 3 out of 5 RLE: HF 2/5, KE 3/5, ADF 1/5, APF 2-3/5 LLE: HF 4/5, KE 4/5,  ADF 4/5, APF 4/5 No ankle clonus Musculoskeletal:  Decreased C-spine ROM Tender over right greater trochanter, pelvis Pain with ROM of right lower extremity  Assessment/Plan: 1. Functional deficits which require 3+ hours per day of interdisciplinary therapy in a comprehensive inpatient rehab setting. Physiatrist is providing close team supervision and 24 hour management of active medical problems listed below. Physiatrist and rehab team continue to assess barriers to discharge/monitor patient progress toward functional and medical goals  Care Tool:  Bathing    Body parts bathed by patient: Right arm, Left arm, Chest, Abdomen, Front perineal area, Buttocks, Right upper leg, Left upper leg, Face   Body parts bathed by helper: Right lower leg, Left lower leg  Bathing assist Assist Level: Minimal Assistance - Patient > 75%     Upper Body Dressing/Undressing Upper body dressing   What is the patient wearing?: Pull over shirt    Upper body assist Assist Level: Supervision/Verbal cueing    Lower Body Dressing/Undressing Lower body dressing      What is the patient wearing?: Incontinence brief, Pants     Lower body assist Assist for lower body dressing: Moderate Assistance - Patient 50 - 74%     Toileting Toileting    Toileting assist Assist for toileting: Minimal Assistance - Patient > 75%     Transfers Chair/bed transfer  Transfers assist     Chair/bed transfer assist level: Minimal Assistance - Patient > 75%     Locomotion Ambulation   Ambulation assist    Ambulation activity did not occur: Safety/medical concerns  Assist level: Minimal Assistance - Patient > 75% Assistive device: Walker-rolling Max distance: 25   Walk 10 feet activity   Assist  Walk 10 feet activity did not occur: Safety/medical concerns  Assist level: Minimal Assistance - Patient > 75% Assistive device: Walker-rolling   Walk 50 feet activity   Assist Walk 50 feet with 2 turns activity did not occur: Safety/medical concerns         Walk 150 feet activity   Assist Walk 150 feet activity did not occur: Safety/medical concerns         Walk 10 feet on uneven surface  activity   Assist Walk 10 feet on uneven surfaces activity did not occur: Safety/medical concerns         Wheelchair     Assist Is the patient using a wheelchair?: Yes Type of Wheelchair: Manual Wheelchair activity did not occur: Safety/medical concerns         Wheelchair 50 feet with 2 turns activity    Assist    Wheelchair 50 feet with 2 turns activity did not occur: Safety/medical concerns       Wheelchair 150 feet activity     Assist  Wheelchair 150 feet activity did not occur: Safety/medical concerns       Blood pressure 117/68, pulse 100, temperature 98.4 F (36.9 C), temperature source Oral, resp. rate 18, height 5\' 7"  (1.702 m), weight 58.1 kg, SpO2 96%.  Medical Problem List and Plan: 1. Functional deficits secondary to incomplete quadriplegia- ASIA D after fall with history of longstanding cervical stenosis             -patient may  shower             -ELOS/Goals: 12 to 16 days, PT/OT supervision to mod I, SLP N/A           Con't CIR PT and OT and now SLP  Team conference today- d/c date 11/1    Limited by quadriparesis- pt reports occurred at work- but [employer] comp denied per SW  - has seen GI in past- will have SLP do MBSS to rule out that as cause for swallowing issues- GI suggested barium swallow and endoscopy prior to admit.    2.   Antithrombotics: -DVT/anticoagulation:  Pharmaceutical: Lovenox             -antiplatelet therapy: none   3. Pain Management: Tylenol, Percocet as needed             -continue gabapentin 200 mg twice daily   10/17- no complaints of pain- con't regimen   10/19-reports pain in her right arm, right hip and right foot.  Overall under control with current medications   10/20 reports pain is overall under control, continue current regimen  4. Mood/Behavior/Sleep: LCSW to evaluate and provide emotional support             -antipsychotic agents: n/a   5. Neuropsych/cognition: This patient is capable of making decisions on her own behalf.   6. Skin/Wound Care: Routine skin care checks   7. Fluids/Electrolytes/Nutrition: Routine Is and Os and follow-up chemistries             --D3 diet, thin liquids             -lactose intolerant: continue Lactaid   8: Hypothyroidism with history of multinodular goiter s/p total thyroidectomy: continue Synthroid.  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  Hemoglobin A1c was 10.3 in 2014             -per DC: "Patient may not need any oral meds @ discharge home along with her Mounjaro."    10/17- pt's CBGs 115-173 in last 24 hours- will monitor  10/18- overall controlled- con't regimen  -10/ 20- 10/22 CBGs well-controlled, continue to monitor  CBG (last 3)  Recent Labs    04/28/23 0517 04/28/23 1131 04/28/23 1651  GLUCAP 164* 139* 140*    11: Orthostatic hypotension: place TED hose and abd binder.  Improved after fluids given   10/17- will probably be an issue today- not drinking well  10/18-BP running 90's to 100s systolic- if has orthostasis- will need Midodrine   10/ 20 BP controlled, continue to monitor with activity  10/21- HR 107- and BP on low side- will monitor  10/22- started midodrine for OH- will monitor    04/28/2023     2:09 PM 04/28/2023    5:15 AM 04/27/2023    7:37 PM  Vitals with BMI  Systolic 117 107 409  Diastolic 68 73 54  Pulse 100 102 100    12: Normocytic anemia, chronic: follow-up CBC   13: GI prophylaxis: continue PPI   14: SCI: continue cervical collar when out of bed or transporting               -bladder scan/check PVRs    10/17- her RLE ankle DF/PF is stronger than seen yesterday - and LLE stable at 3-/5- will wait on PRAFOs. Also doesn't appear to need [facility]  10/22- got PRAFOs per therapy- heels a little red 15: Right superior and inferior pubic rami fractures: Weightbearing as tolerated   16: Right arm pain: X-ray on 10/13 negative   17: Constipation vs neurogenic bowel: Give sorbitol 60 cc now>>enema if no results   18. CKD IIIA.  Recheck labs.  Avoid nephrotoxic medications   10/17-BUN up to 1.39 from 1.28 however BUN better at 28- down from 37- will push fluids- write order and recheck in AM- will also check CMP weekly. 10/9 creatinine/BUN stable at 1.24/30 10/21- Cr 1.21 and BUN 29- stable for her- pepsi drinker 10/22- About the same with BUN 25 and Cr 1.27 after IVFs- will wait on more IVFs 19. Neurogenic bowel?  10/17- LBM last night after fleets enema- but had been >5 days since LBM- will monitor and see if needs bowel program  10/ 20 add Senokot at bedtime 20. Neurogenic bladder?  10/17- having a lot of incontinent voids- will check bladder scans to see if due to  overflow and is retaining- have ordered  10/18- 2 bladder scans- 0 and 12ml- will con't for now- but her incontinence has resolved it appears, so doing better  -10/ 20 continent, bladder scans not elevated 21. Sedation  10/21- might be baclofen we started- will monitor 22. Spasticity-   10/21- started Baclofen 5 mg TID for this- might be contributing to #21- -also added [employer] PRAFO order for RLE  10/22- therapy feels issues with therapy today were due to orthostatic hypotension, not sedation from  baclofen  - pt's spasticity is better with baclofen 23. Orthostatic hypotension  10/22- will start Midodrine 5 mg TID-with meals for low BP in therapy today even with TEDs- also been using ACE wraps intermittently.    I spent a total of 53    minutes on total care today- >50% coordination of care- due to  D/w SLP about MBS as well as prior chart review from GI; d/w PT and OT about sedation- they think from [facility]; so will treat more aggressively.  Will add Midodrine and not stop Baclofen. Also did team conference about pt today- d/c date set.   LOS: 6 days A FACE TO FACE EVALUATION WAS PERFORMED  Derris Millan 04/28/2023, 6:46 PM

## 2023-04-28 NOTE — Plan of Care (Signed)
  Problem: RH Swallowing Goal: LTG Patient will consume least restrictive diet using compensatory strategies with assistance (SLP) Description: LTG:  Patient will consume least restrictive diet using compensatory strategies with assistance (SLP) Flowsheets (Taken 04/28/2023 1232) LTG: Pt Patient will consume least restrictive diet using compensatory strategies with assistance of (SLP): Modified Independent

## 2023-04-28 NOTE — Patient Care Conference (Signed)
Inpatient RehabilitationTeam Conference and Plan of Care Update Date: 04/28/2023   Time: 11:17AM    Patient Name: [name]      Medical Record Number: [id number]  Date of Birth: Sep 05, 1955 Sex: Female         Room/Bed: 4W21C/4W21C-01 Payor Info: Payor: [employer] MEDICARE / Plan: [employer]ia [employer] MEDICARE HMO/PPO / Product Type: *No Product type* /    Admit Date/Time:  04/22/2023 12:37 PM  Primary Diagnosis:  Acute incomplete quadriplegia [facility])  Hospital Problems: Principal Problem:   Acute incomplete quadriplegia [facility])    Expected Discharge Date: Expected Discharge Date: 05/08/23  Team Members Present: Physician leading conference: Dr. Genice Rouge Social Worker Present: Cecile Sheerer, LCSWA Nurse Present: Vedia Pereyra, RN PT Present: Bernie Covey, PT OT Present: Roney Mans, OT SLP Present: Feliberto Gottron, SLP PPS Coordinator present : Fae Pippin, SLP     Current Status/Progress Goal Weekly Team Focus  Bowel/Bladder   Patient is continent of bowel and bladder   To remain so and minimize barriers to bathroom.   Continue to use bathroom with rolling walker and to do so independently ( with supervision )    Swallow/Nutrition/ Hydration   Eval Pending           ADL's   mdo A LB dressing; toileting with mod A; CGA/min A transfers with RW; fatigues quickly;   supervision overall   endurance, BADLs, transfers    Mobility   CGA with short distance gait and transfers when not limited by OH/pain, fatigues quickly   mod I transfers, supervision gait  upright tolerance, endurance    Communication                Safety/Cognition/ Behavioral Observations               Pain   Patient uses 1-10 pain scale and when pain is present it is mild.   To continue to experience pain control.   Continue 1-10 pain scale and to encourage multimodals for pain such as warmth, elevation etc.    Skin   Patient skin is in good condition   To keep it from breaking  down.  Teach patient improtance of movement, good nutrition, good hygeine.      Discharge Planning:  D/c to home with her husband who will provide PRN support as he works. Assistance from adult son with IDD who will help as well. Pt husband likely to take time off from work. SW will confirm there are no barriers to discharge.   Team Discussion: Acute incomplete quadriplegia. Continent B/B with LBM 10/22. Lower back pain managed with PRN medications. Sleeping okay. Skin intact. Having a lot of secretions. Speech eval pending. Hates water but drinks Pepsi all the time. Labs stable. Orthostatic-ABD binder, Teds, and ace wraps. C-collar OOB. RLE/LLE WBAT with right PRAFO boot. D3 diet. AC/HS. Moves very slow. Is very fatigued during therapy with poor energy level.  Patient on target to meet rehab goals: yes, progressing toward goals with discharge date of 05/08/23  *See Care Plan and progress notes for long and short-term goals.   Revisions to Treatment Plan:  Baclofen started.  Speech consulted. Encourage fluids. MBS 04/29/23. Monitor labs/VS Teaching Needs: Medications, safety, self care, gait/transfer training, etc.   Current Barriers to Discharge: Decreased caregiver support, Wound care, and Weight bearing restrictions  Possible Resolutions to Barriers: Family education Independent with wound care Adhere to weight bearing restrictions.  Order recommended DME     Medical Summary Current Status: quadriparetic;  continent- LBM 10/22- this AM with PT; small- likes Pepsi- whates water- collar OOB; WBAT- D3 diet/thin liquids-  a lot secretions- phlegmy- since thyroid cancer/ no radition  Barriers to Discharge: Other (comments);Spasticity;Self-care education;Uncontrolled Pain;Weight bearing restrictions;Neurogenic Bowel & Bladder;Behavior/Mood  Barriers to Discharge Comments: limited by poor energy; fatigue; spasticity- possible orthostaitc hypotension- wears TEDs- might need ACE wraps and  TEDs- poor endurance Possible Resolutions to [employer] Focus: added baclofen- for spsticity- was with it for 5 minutes- and then out it- will add Meds for orthostasis- d/c 11/1   Continued Need for Acute Rehabilitation Level of Care: The patient requires daily medical management by a physician with specialized training in physical medicine and rehabilitation for the following reasons: Direction of a multidisciplinary physical rehabilitation program to maximize functional independence : Yes Medical management of patient stability for increased activity during participation in an intensive rehabilitation regime.: Yes Analysis of laboratory values and/or radiology reports with any subsequent need for medication adjustment and/or medical intervention. : Yes   I attest that I was present, lead the team conference, and concur with the assessment and plan of the team.   Jearld Adjutant 04/28/2023, 3:05 PM

## 2023-04-28 NOTE — Progress Notes (Signed)
Patient ID: [name], female   DOB: September 30, 1955, 67 y.o.   MRN: [id number]  1512-   Cecile Sheerer, MSW, LCSWA Office: [phone] Cell: [phone] Fax: [phone]

## 2023-04-28 NOTE — Progress Notes (Signed)
Orthopedic Tech Progress Note Patient Details:  [name] 06-18-1956 [id number]  Dropped off PRAFO to bedside with 4W RN. Ortho Devices Type of Ortho Device: Prafo boot/shoe Ortho Device/Splint Location: RLE Ortho Device/Splint Interventions: Ordered   Post Interventions Patient Tolerated: Well Instructions Provided: Care of device, Adjustment of device  Sherilyn Banker 04/28/2023, 3:33 PM

## 2023-04-28 NOTE — Progress Notes (Signed)
Occupational Therapy Session Note  Patient Details  Name: [name] MRN: [id number] Date of Birth: February 19, 1956  Today's Date: 04/28/2023 OT Individual Time: 1415-1525 OT Individual Time Calculation (min): 70 min    Short Term Goals: Week 1:  OT Short Term Goal 1 (Week 1): Patient to perform toilet transfer with Min assist OT Short Term Goal 2 (Week 1): Patient to perform LB dressing with AE as needed with Min assist OT Short Term Goal 3 (Week 1): Patient to perform bathing with AE as needed with Min assist OT Short Term Goal 4 (Week 1): Patient to perform grooming with set up assist sitting at sink  Skilled Therapeutic Interventions/Progress Updates:   Pt seen for skilled OT session. Pt with LE's elevated bed level and requesting toileting upon OT arrival. Neck collar and ACE wraps applied. Relied heavily on clinician and bed features for supine to sit then RW to toilet with min A. Mod A clothing mngt and mod A for buttocks and min A peri hygiene post BM and voiding. See flowsheet. Sat sink side briefly for hand washing and hair care. Mobilized to recliner with LE's elevated. HR 88 SpO2 100% on RA and BP 137/63. OT provided pillow props on B UE's for pressure releif as well as pillow in seat and back. Light UE ROM and foam grasp trainer light resistance issued for grasp and pinch strength. Left pt recliner level with chair alarm belt active, needs and nurse call button in reach.   Pain: 4/10 neck and hip pain relief in recliner with pillow props  Therapy Documentation Precautions:  Precautions Precautions: Cervical, Fall Precaution Comments: verbally reviewed brace wear Required Braces or Orthoses: Cervical Brace Cervical Brace: Hard collar Restrictions Weight Bearing Restrictions: Yes RLE Weight Bearing: Weight bearing as tolerated LLE Weight Bearing: Weight bearing as tolerated Other Position/Activity Restrictions: per orders, ok to donn collar edge of bed, amb to  bathroom/shower without c collar  Therapy/Group: Individual Therapy  Vicenta Dunning 04/28/2023, 7:28 AM

## 2023-04-29 ENCOUNTER — Inpatient Hospital Stay (HOSPITAL_COMMUNITY): Payer: Medicare HMO

## 2023-04-29 DIAGNOSIS — G8250 Quadriplegia, unspecified: Secondary | ICD-10-CM | POA: Diagnosis not present

## 2023-04-29 LAB — GLUCOSE, CAPILLARY
Glucose-Capillary: 128 mg/dL — ABNORMAL HIGH (ref 70–99)
Glucose-Capillary: 161 mg/dL — ABNORMAL HIGH (ref 70–99)
Glucose-Capillary: 135 mg/dL — ABNORMAL HIGH (ref 70–99)
Glucose-Capillary: 192 mg/dL — ABNORMAL HIGH (ref 70–99)

## 2023-04-29 MED ORDER — AMANTADINE HCL 100 MG PO CAPS
100.0000 mg | ORAL_CAPSULE | Freq: Every day | ORAL | Status: DC
  Administered 2023-04-29 – 2023-04-30 (×2): 100 mg via ORAL
  Filled 2023-04-29 (×2): qty 1

## 2023-04-29 MED ORDER — TRAMADOL HCL 50 MG PO TABS
50.0000 mg | ORAL_TABLET | Freq: Two times a day (BID) | ORAL | Status: DC | PRN
  Administered 2023-04-30: 50 mg via ORAL
  Filled 2023-04-29 (×2): qty 1

## 2023-04-29 MED ORDER — GABAPENTIN 100 MG PO CAPS
100.0000 mg | ORAL_CAPSULE | Freq: Two times a day (BID) | ORAL | Status: DC
  Administered 2023-04-29 – 2023-04-30 (×4): 100 mg via ORAL
  Filled 2023-04-29 (×4): qty 1

## 2023-04-29 NOTE — Procedures (Signed)
Modified Barium Swallow Study  Patient Details  Name: [name] MRN: [id number] Date of Birth: 21-Oct-1955  Today's Date: 04/29/2023  Modified Barium Swallow completed.  Full report located under Chart Review in the Imaging Section.  History of Present Illness Pt is a 67 year old female presents with the chief complaint of cervical spinal cord injury after a fall, admitted with cervical spinal cord injury central vs hemicord pattern; Acute Fracture of the right superior and inferior pubic rami- WBAT    PMH significant for Arthritis, Blood dyscrasia, Diabetes mellitus without complication (HCC), Diverticulosis, Hypothyroidism, Neuropathy, and Renal insufficiency. Per patient interview, patient underwent thyroid surgery in January 2024 and saw outpatient GI in September of 2024 with recommendations for further esophageal workup. Patient reports globus sensation with all solids at the level of the upper sternum. Recommended MBS to determine esophageal vs. oropharyngeal swallowing involvement.  Clinical Impression Patient presents with mild oropharyngeal dysphagia primarily impacted by upwards impact of esophageal deficits. Orally, patient underwent examination without dentures, thus oral deficits of prolonged mastication and bolus preparation were observed with regular solids. Patient at bedside reports need to "bear down" to swallow, and given esophageal retention and stasis observed at multiple levels, suspect impaired pressure system across the overall swallow has resulted in delay in lingual motion to transport bolus posteriorly in the oral cavity and reduced pharyngeal constriction. Reduced constriction results in diffuse pharyngeal residue with thin liquids and residue in the vallecula with all solids. Solid residue spontaneously clears with additional reflexive swallows. Residue of thin liquids appeared to trickle in to the laryngeal vestibule during chewing of regular solids, though residue  never touched the vocal folds and was eventually ejected with spontaneous swallow/throat clear. During esophageal screening, observed esophageal stasis/retention in the cervical esophagus and above the lower esophagel sphincter with all solids. Mid-esophagus appeared tortuous in shape as bolus material travelled. Confounding esophageal impact on oropharyngeal swallow, patient exhibits tissue changes in the lower pharynx from thryoid surgery and placement of subsequent hardware, resulting in minimally reduced epiglottic inversion, though no penetration/aspiration observed during the swallow. Recommend continuation of Dys3/thin liquid diet. Instructed patient to intermittently throat clear/reswallow, though patient observed to complete independently of instruction during study. Administer medications whole in puree to assist with pharyngeal transit. Alerted patient to recommendations for GI and ENT to assess impact of both esophageal function and thryoid surgery sequelae on overall swallowing function and address as able. SLP will plan to follow up x1 to assess use of compensatory strategies, then will likely sign off. Patient agreeable to this plan.  Factors that may increase risk of adverse event in presence of aspiration Rubye Oaks [employer] Clearance Coots 2021):  n/a  Swallow Evaluation Recommendations Recommendations: PO diet PO Diet Recommendation: Dysphagia 3 (Mechanical soft);Thin liquids (Level 0) Liquid Administration via: Cup;Straw Medication Administration: Whole meds with puree Supervision: Patient able to self-feed;Intermittent supervision/cueing for swallowing strategies Swallowing strategies  : Slow rate;Clear throat intermittently Postural changes: Stay upright 30-60 min after meals;Position pt fully upright for meals Oral care recommendations: Oral care BID (2x/day) Recommended consults: Consider ENT consultation;Consider GI consultation;Consider esophageal assessment  [name], M.A.,  CCC-SLP  [name] 04/29/2023,11:49 AM

## 2023-04-29 NOTE — Progress Notes (Signed)
Patient ID: [name], female   DOB: 1956/06/01, 67 y.o.   MRN: [id number]  SW returned phone call to pt husband [name] who reported that he left FMLA forms at front desk. SW informed will check and follow-up if unable to locate.   SW gave FMLA forms to PA for pt and pt husband to complete.   Cecile Sheerer, MSW, LCSWA Office: [phone] Cell: [phone] Fax: [phone]

## 2023-04-29 NOTE — Progress Notes (Signed)
Occupational Therapy Session Note  Patient Details  Name: [name] MRN: [id number] Date of Birth: 07/21/1955  Today's Date: 04/29/2023 OT Individual Time: 1045-1100 OT Individual Time Calculation (min): 15 min  and Today's Date: 04/29/2023 OT Missed Time: 60 Minutes Missed Time Reason: Patient fatigue;Other (comment) (pt c/o n/v and fatigue)   Short Term Goals: Week 1:  OT Short Term Goal 1 (Week 1): Patient to perform toilet transfer with Min assist OT Short Term Goal 2 (Week 1): Patient to perform LB dressing with AE as needed with Min assist OT Short Term Goal 3 (Week 1): Patient to perform bathing with AE as needed with Min assist OT Short Term Goal 4 (Week 1): Patient to perform grooming with set up assist sitting at sink  Skilled Therapeutic Interventions/Progress Updates:    Pt sleeping upon arrival but easily aroused, although pt did not open eyes during brief exchange. Pt reports n/v all morning and fatigue 2/2 lack of sleep during night. Attempted to engage pt in bedside activities but pt unable to activiely participate in a safe manner. Pt missed 60 mins skilled OT services. Will attempt to see again as schedule allows.  Therapy Documentation Precautions:  Precautions Precautions: Cervical, Fall Precaution Comments: verbally reviewed brace wear Required Braces or Orthoses: Cervical Brace Cervical Brace: Hard collar Restrictions Weight Bearing Restrictions: Yes RLE Weight Bearing: Weight bearing as tolerated LLE Weight Bearing: Weight bearing as tolerated Other Position/Activity Restrictions: per orders, ok to donn collar edge of bed, amb to bathroom/shower without c collar General: General OT Amount of Missed Time: 60 Minutes   Pain: Pt denies pain this morning but c/o n/v and fatigue   Therapy/Group: Individual Therapy  Rich Brave 04/29/2023, 11:50 AM

## 2023-04-29 NOTE — Progress Notes (Signed)
Physical Therapy Session Note  Patient Details  Name: [name] MRN: [id number] Date of Birth: 12-Jun-1956  Today's Date: 04/29/2023 PT Individual Time: 1432-1530 PT Individual Time Calculation (min): 58 min   Short Term Goals: Week 1:  PT Short Term Goal 1 (Week 1): Pt will tolerate sitting upright OOB >4 hours PT Short Term Goal 2 (Week 1): Pt will tolerate standing more than 2 min without changes in vital signs PT Short Term Goal 3 (Week 1): Pt will ambulate >30 ft with LRAD  Skilled Therapeutic Interventions/Progress Updates:      Therapy Documentation Precautions:  Precautions Precautions: Cervical, Fall Precaution Comments: verbally reviewed brace wear Required Braces or Orthoses: Cervical Brace Cervical Brace: Hard collar Restrictions Weight Bearing Restrictions: Yes RLE Weight Bearing: Weight bearing as tolerated LLE Weight Bearing: Weight bearing as tolerated Other Position/Activity Restrictions: per orders, ok to donn collar edge of bed, amb to bathroom/shower without c collar   Pt received semi-reclined repeating " I just wanna wake up, but I can't". PT notified nurse and pt encouraged to participate to best of her abilities with PT from spouse and staff. Pt reports need to void and dependent for donning of ted hose, ace wraps, and socks. Patient required increased time for initiation, cuing, rest breaks, and for completion of tasks throughout session. Utilized therapeutic use of self throughout to promote efficiency. Pt supervision with bed mobility with use of bed features and with sit<>stand with RW. Pt CGA with ambulatory transfer to bathroom and continent of bladder, nurse tech notified. Pt with report of dizziness that dissipated and episode of emesis in restroom. Pt CGA with ambulatory transfer to return to bed and dependent for donning of fresh brief. Pt mod A with sit to lying and PT and nurse tech removed ted hose, ace wraps, socks and donned right PRAFO and  left moon boot. Pt left with all needs in reach and alarm on.   Therapy/Group: Individual Therapy  Truitt Leep Truitt Leep PT, DPT  04/29/2023, 7:57 AM

## 2023-04-29 NOTE — Progress Notes (Signed)
Occupational Therapy Note  Patient Details  Name: [name] MRN: [id number] Date of Birth: 04-Jul-1956  Today's Date: 04/29/2023 OT Missed Time: 45 Minutes Missed Time Reason: Patient fatigue  Pt missed 45 mins skilled OT services. Pt somnolent and did not open eyes when addressed. Pt slow to respond bu oriented to year and month but not place. Pt unable to participate in therapy. RN Becky aware.     Lavone Neri [facility] 04/29/2023, 1:44 PM

## 2023-04-29 NOTE — Progress Notes (Signed)
PROGRESS NOTE   Subjective/Complaints:  Pt reports had Nausea then vomited this AM around 6am Said slept OK But still sleepy this AM.  Hard ot wake up.  Said couldn't find call bell to call nursing when sick this AM.  Still a little nauseated but doesn't want Zofran right now.   Needs FMLA paperwork for husband.    ROS:    Pt denies SOB, abd pain, CP,  (+)N/V/C/D, and vision changes    Except for HPI  Objective:   No results found. Recent Labs    04/27/23 0558  WBC 7.8  HGB 8.5*  HCT 25.6*  PLT 253    Recent Labs    04/27/23 0558 04/28/23 0744  NA 137 138  K 4.7 4.5  CL 104 100  CO2 22 25  GLUCOSE 130* 179*  BUN 29* 25*  CREATININE 1.21* 1.27*  CALCIUM 8.9 9.4    Intake/Output Summary (Last 24 hours) at 04/29/2023 0826 Last data filed at 04/28/2023 2135 Gross per 24 hour  Intake 236 ml  Output 201 ml  Net 35 ml        Physical Exam: Vital Signs Blood pressure 121/64, pulse 83, temperature 98.1 F (36.7 C), temperature source Oral, resp. rate 18, height 5\' 7"  (1.702 m), weight 58.1 kg, SpO2 96%.        General: awake, alert, appropriate,  sitting up slightly in bed; lethargicNAD HENT: conjugate gaze; oropharynx moist CV: regular rate and rhythm; no JVD Pulmonary: CTA B/L; no W/R/R- good air movement GI: soft, c/o N/V x1- slightly hypoactive Psychiatric: appropriate- but really flat Neurological: less alert/sleepy More lethargic this AM- tangential speech noted- kept falling asleep, -needed verbal cues to wake up - MAS of 1- but less than yesterday in Ue's with hoffman's (+) B/L- however more ROM of arms B/L  Moving all 4 extremities in bed Skin: Protective dressing on heels been removed, no open wounds noted  Prior exam Neuro: Alert and oriented x 4, follows commands, cranial nerves II through XII intact, voice is a little hoarse, memory intact, fair insight Sensation intact  to light touch in all 4 extremities however decreased in stocking glove type distribution below her knees-she reports this is chronic RUE: 4-/5 Deltoid, 4/5 Biceps, 4/5 Triceps, 4/5 Wrist Ext, 4/5 Grip, intrinsic hand muscles/fifth digit abduction 1 out of 5 LUE: 4/5 Deltoid, 4/5 Biceps, 4/5 Triceps, 4/5 Wrist Ext, 4/5 Grip, intrinsic hand muscles/fifth digit abduction 3 out of 5 RLE: HF 2/5, KE 3/5, ADF 1/5, APF 2-3/5 LLE: HF 4/5, KE 4/5,  ADF 4/5, APF 4/5 No ankle clonus Musculoskeletal:  Decreased C-spine ROM Tender over right greater trochanter, pelvis Pain with ROM of right lower extremity  Assessment/Plan: 1. Functional deficits which require 3+ hours per day of interdisciplinary therapy in a comprehensive inpatient rehab setting. Physiatrist is providing close team supervision and 24 hour management of active medical problems listed below. Physiatrist and rehab team continue to assess barriers to discharge/monitor patient progress toward functional and medical goals  Care Tool:  Bathing    Body parts bathed by patient: Right arm, Left arm, Chest, Abdomen, Front perineal area, Buttocks, Right upper leg, Left upper leg,  Face   Body parts bathed by helper: Right lower leg, Left lower leg     Bathing assist Assist Level: Minimal Assistance - Patient > 75%     Upper Body Dressing/Undressing Upper body dressing   What is the patient wearing?: Pull over shirt    Upper body assist Assist Level: Supervision/Verbal cueing    Lower Body Dressing/Undressing Lower body dressing      What is the patient wearing?: Incontinence brief, Pants     Lower body assist Assist for lower body dressing: Moderate Assistance - Patient 50 - 74%     Toileting Toileting    Toileting assist Assist for toileting: Minimal Assistance - Patient > 75%     Transfers Chair/bed transfer  Transfers assist     Chair/bed transfer assist level: Minimal Assistance - Patient > 75%      Locomotion Ambulation   Ambulation assist   Ambulation activity did not occur: Safety/medical concerns  Assist level: Minimal Assistance - Patient > 75% Assistive device: Walker-rolling Max distance: 25   Walk 10 feet activity   Assist  Walk 10 feet activity did not occur: Safety/medical concerns  Assist level: Minimal Assistance - Patient > 75% Assistive device: Walker-rolling   Walk 50 feet activity   Assist Walk 50 feet with 2 turns activity did not occur: Safety/medical concerns         Walk 150 feet activity   Assist Walk 150 feet activity did not occur: Safety/medical concerns         Walk 10 feet on uneven surface  activity   Assist Walk 10 feet on uneven surfaces activity did not occur: Safety/medical concerns         Wheelchair     Assist Is the patient using a wheelchair?: Yes Type of Wheelchair: Manual Wheelchair activity did not occur: Safety/medical concerns         Wheelchair 50 feet with 2 turns activity    Assist    Wheelchair 50 feet with 2 turns activity did not occur: Safety/medical concerns       Wheelchair 150 feet activity     Assist  Wheelchair 150 feet activity did not occur: Safety/medical concerns       Blood pressure 121/64, pulse 83, temperature 98.1 F (36.7 C), temperature source Oral, resp. rate 18, height 5\' 7"  (1.702 m), weight 58.1 kg, SpO2 96%.  Medical Problem List and Plan: 1. Functional deficits secondary to incomplete quadriplegia- ASIA D after fall with history of longstanding cervical stenosis             -patient may  shower             -ELOS/Goals: 12 to 16 days, PT/OT supervision to mod I, SLP N/A           D/c 11/1  Con't CIR PT and OT  Has MBSS this AM  Limited by quadriparesis- pt reports occurred at work- but [employer] comp denied per SW  - has seen GI in past- will have SLP do MBSS to rule out that as cause for swallowing issues- GI suggested barium swallow and endoscopy  prior to admit.    2.  Antithrombotics: -DVT/anticoagulation:  Pharmaceutical: Lovenox             -antiplatelet therapy: none   3. Pain Management: Tylenol, Percocet as needed             -continue gabapentin 200 mg twice daily   10/17- no complaints of pain- con't  regimen   10/19-reports pain in her right arm, right hip and right foot.  Overall under control with current medications   10/20 reports pain is overall under control, continue current regimen  10/23- is taking Oxy which can contribute to sedation- last dose 8pm night prior- changed Oxy to Tramadol 50 mg q12 hours prn 4. Mood/Behavior/Sleep: LCSW to evaluate and provide emotional support             -antipsychotic agents: n/a   5. Neuropsych/cognition: This patient is capable of making decisions on her own behalf.   6. Skin/Wound Care: Routine skin care checks   7. Fluids/Electrolytes/Nutrition: Routine Is and Os and follow-up chemistries             --D3 diet, thin liquids             -lactose intolerant: continue Lactaid   8: Hypothyroidism with history of multinodular goiter s/p total thyroidectomy: continue Synthroid.  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  Hemoglobin A1c was 10.3 in 2014             -per DC: "Patient may not need any oral meds @ discharge home along with her Mounjaro."    10/17- pt's CBGs 115-173 in last 24 hours- will monitor  10/18- overall controlled- con't regimen  -10/ 20- 10/22 CBGs well-controlled, continue to monitor  10/23- Cbgs running well- con't regimen CBG (last 3)  Recent Labs    04/28/23 1651 04/28/23 2139 04/29/23 0610  GLUCAP 140* 145* 128*    11: Orthostatic hypotension: place TED hose and abd binder.  Improved after fluids given   10/17- will probably be an issue today- not drinking well  10/18-BP running 90's to 100s systolic- if has orthostasis- will  need Midodrine   10/ 20 BP controlled, continue to monitor with activity  10/21- HR 107- and BP on low side- will monitor  10/22- started midodrine for OH- will monitor  10/23- said still had some dizziness- will con't Midodrine    04/29/2023    5:28 AM 04/28/2023    8:22 PM 04/28/2023    2:09 PM  Vitals with BMI  Systolic 121 109 409  Diastolic 64 56 68  Pulse 83 80 100    12: Normocytic anemia, chronic: follow-up CBC   13: GI prophylaxis: continue PPI   14: SCI: continue cervical collar when out of bed or transporting               -bladder scan/check PVRs    10/17- her RLE ankle DF/PF is stronger than seen yesterday - and LLE stable at 3-/5- will wait on PRAFOs. Also doesn't appear to need [facility]  10/22- got PRAFOs per therapy- heels a little red  10/23- Pt hasn't worn PRAFOs yet- on floor in bag- will d/w Nursing  15: Right superior and inferior pubic rami fractures: Weightbearing as tolerated   16: Right arm pain: X-ray on 10/13 negative   17: Constipation vs neurogenic bowel: Give sorbitol 60 cc now>>enema if no results   18. CKD IIIA.  Recheck labs.  Avoid nephrotoxic medications   10/17-BUN up to 1.39 from 1.28 however BUN better at 28- down from 37- will push fluids- write order and recheck in AM- will also check CMP weekly. 10/9 creatinine/BUN stable at 1.24/30 10/21- Cr 1.21 and BUN 29- stable for her- pepsi drinker 10/22-  About the same with BUN 25 and Cr 1.27 after IVFs- will wait on more IVFs 19. Neurogenic bowel?  10/17- LBM last night after fleets enema- but had been >5 days since LBM- will monitor and see if needs bowel program  10/ 20 add Senokot at bedtime  10/23- LBM yesterday 20. Neurogenic bladder?  10/17- having a lot of incontinent voids- will check bladder scans to see if due to overflow and is retaining- have ordered  10/18- 2 bladder scans- 0 and 12ml- will con't for now- but her incontinence has resolved it appears, so doing better  -10/ 20  continent, bladder scans not elevated 21. Sedation  10/21- might be baclofen we started- will monitor 22. Spasticity-   10/21- started Baclofen 5 mg TID for this- might be contributing to #21- -also added [employer] PRAFO order for RLE  10/22- therapy feels issues with therapy today were due to orthostatic hypotension, not sedation from baclofen  - pt's spasticity is better with baclofen  10/23- stopped Baclofen for now since so sedated 23. Orthostatic hypotension  10/22- will start Midodrine 5 mg TID-with meals for low BP in therapy today even with TEDs- also been using ACE wraps intermittently.  24. Nausea/vomiting  10/23- pt had episode of vomiting this AM- has Zofran as needed for nausea- doesn't want right now- don't want her to have phenergan, because more sedating.  25. Sedation- Lethargy  10/23- will stop Oxy; decrease Gabapentin to 100 mg BID; reviewed her TSH- is 0.5- added Amantadine 100 mg daily to see if will help her wake up/have more energy.  26. GI issues/dysphagia  10/23- per GI prior to admission, needed an endoscopy and barium swallow- if MBSS shows it, will call consult to GI  I spent a total of 50   minutes on total care today- >50% coordination of care- due to  D/w PA about med changes and many med changes made to help with sedation- also went over FMLA paperwork- given to nurse- for husband- Dahliana Karber. .  Also spoke with SLP about MBSS  LOS: 7 days A FACE TO FACE EVALUATION WAS PERFORMED  Burch Marchuk 04/29/2023, 8:26 AM

## 2023-04-30 DIAGNOSIS — G8250 Quadriplegia, unspecified: Secondary | ICD-10-CM | POA: Diagnosis not present

## 2023-04-30 DIAGNOSIS — R1310 Dysphagia, unspecified: Secondary | ICD-10-CM

## 2023-04-30 DIAGNOSIS — K219 Gastro-esophageal reflux disease without esophagitis: Secondary | ICD-10-CM

## 2023-04-30 LAB — GLUCOSE, CAPILLARY
Glucose-Capillary: 132 mg/dL — ABNORMAL HIGH (ref 70–99)
Glucose-Capillary: 146 mg/dL — ABNORMAL HIGH (ref 70–99)
Glucose-Capillary: 170 mg/dL — ABNORMAL HIGH (ref 70–99)
Glucose-Capillary: 207 mg/dL — ABNORMAL HIGH (ref 70–99)

## 2023-04-30 MED ORDER — ENOXAPARIN SODIUM 40 MG/0.4ML IJ SOSY
40.0000 mg | PREFILLED_SYRINGE | INTRAMUSCULAR | Status: DC
  Administered 2023-04-30 – 2023-05-02 (×2): 40 mg via SUBCUTANEOUS
  Filled 2023-04-30 (×2): qty 0.4

## 2023-04-30 MED ORDER — SORBITOL 70 % SOLN
45.0000 mL | Freq: Once | Status: AC
  Administered 2023-04-30: 45 mL via ORAL
  Filled 2023-04-30: qty 60

## 2023-04-30 MED ORDER — MEGESTROL ACETATE 400 MG/10ML PO SUSP
800.0000 mg | Freq: Every day | ORAL | Status: DC
  Administered 2023-05-04 – 2023-05-12 (×9): 800 mg via ORAL
  Filled 2023-04-30 (×11): qty 20

## 2023-04-30 MED ORDER — DICLOFENAC SODIUM 1 % EX GEL
2.0000 g | Freq: Four times a day (QID) | CUTANEOUS | Status: DC
  Administered 2023-04-30 – 2023-05-10 (×23): 2 g via TOPICAL
  Filled 2023-04-30: qty 100

## 2023-04-30 NOTE — Progress Notes (Signed)
Occupational Therapy Weekly Progress Note  Patient Details  Name: [name] MRN: [id number] Date of Birth: September 23, 1955  Beginning of progress report period: April 23, 2023 End of progress report period: April 30, 2023  Patient has met 3 of 3 short term goals.  Pt is making slow and inconsistent progress but on target to meet LTG. Pt's transitional movements are slow but all transfers and amb with RW at [facility]. UB dressing with supervision. Min A for LB dressing. Pt reports occasional n/v. Family has not been present for therapy.   Patient continues to demonstrate the following deficits: muscle weakness, decreased cardiorespiratoy endurance, impaired timing and sequencing, unbalanced muscle activation, and decreased coordination, and decreased sitting balance, decreased standing balance, decreased postural control, and decreased balance strategies and therefore will continue to benefit from skilled OT intervention to enhance overall performance with BADL and Reduce care partner burden.  Patient progressing toward long term goals..  Continue plan of care.  OT Short Term Goals Week 1:  OT Short Term Goal 1 (Week 1): Patient to perform toilet transfer with Min assist OT Short Term Goal 1 - Progress (Week 1): Met OT Short Term Goal 2 (Week 1): Patient to perform LB dressing with AE as needed with Min assist OT Short Term Goal 2 - Progress (Week 1): Met OT Short Term Goal 3 (Week 1): Patient to perform bathing with AE as needed with Min assist OT Short Term Goal 3 - Progress (Week 1): Met OT Short Term Goal 4 (Week 1): Patient to perform grooming with set up assist sitting at sink Week 2:  OT Short Term Goal 1 (Week 2): STG=LTG 2/2 ELOS    Rich Brave 04/30/2023, 9:34 AM

## 2023-04-30 NOTE — H&P (View-Only) (Signed)
Consultation  Referring Provider:   Dr. Berline Chough Primary Care Physician:  Jerl Mina, MD Primary Gastroenterologist: [facility]        Reason for Consultation: Dysphagia              HPI:   [name] is a 67 y.o. female with a past medical history as listed below including type 2 diabetes, hypothyroidism, total thyroidectomy on 01/24/2023 for invasive follicular thyroid carcinoma, diverticulitis, GERD, colon polyps and multiple others, we are asked to see her today in regards to dysphagia.    04/22/2023 patient initially admitted to rehab here, she initially presented to the hospital on 04/13/2023 after falling backward and striking the right side of her head.  Neurosurgery was consulted and felt most likely cervical spinal cord injury mostly in a central versus hemicord pattern.,  Hypotension treated with Levophed.  Continued to have multiple episodes of orthostatic hypotension receiving normal saline fluid boluses.  Started on Heparin SubQ for DVT prophylaxis and also on Percocet for pain control.  Told to follow-up outpatient with neurosurgery.    04/29/2023 modified barium swallow with oropharyngeal dysphagia.  At that time they suspected impaired pressure system across the overall swallow had resulted in delay in lingual motion to transport bolus posteriorly into the oral cavity and reduced pharyngeal constriction, midesophagus appeared tortuous and shape, tissue changes in the lower pharynx from thyroid surgery and placement of subsequent hardware, continued on a dysphagia 3/thin liquid diet.    04/30/2023 note from rehab today discusses that she ate less than 25% of her tray and did not have an appetite and was having "swallowing discomfort".    Today, patient explains that she has had trouble swallowing for a few months, apparently describes that about once a month she will have an acute episode where she eats and then gets a stinging sensation in her lower esophagus that  resulted in hiccups and has to run to the bathroom to spit up clear liquid, this occurs about once a month, but daily now she feels some burning and stinging just over her sternum at the bottom of her esophagus regardless of what she is putting her mouth, water or food.  This hinders her want to eat rated at times as a 10/10.    Denies fever, chills or weight loss.     GI history: 03/18/2023 office visit with GI at [facility], discussed history of previous polyps and reflux with dysphagia and history of Schatzki's ring.at that visit they discussed dysphagia and requested an EGD with dilation.  Apparently had been set up for these procedures twice within the past year and had to cancel due to a clavicle fracture and more recently status post thyroid cancer with thyroidectomy in July.  At that time they recommended follow-up with PCP for some trouble breathing.  Recommended a barium swallow and continue Omeprazole daily.  Procedures planned for 2025 including EGD and colonoscopy.    Previous summary of evaluation as below from their note:  - 2010: CSY (@ [facility]) - TVA with focal high-grade dysplasia at hepatic flexure, SA in transverse colon, TA in sigmoid colon.  - 09/19/09: CSY (@ [facility]) - TA & SSA at hepatic flexure, hyperplastic polyp in sigmoid colon. Patient follow up for post polypectomy bleeding 2 weeks after a colonoscopy with 2 large polyps removed.   - 08/18/2018: colonoscopy for hx of polyps: One 14 mm TA polyp ascending colon, one med sessile serrated polyp hepatic flexure, diverticulosis.  - 08/18/2018:  EGD for dysphagia/GERD: Mild Schatzki ring, dilated, gastritis, erythematous duodenopathy: Path: preserved villous architecture with no histopath changes. And gastric antral mild reactive and reparative changes. Neg H pylori.  - 01/13/2021: CTA/P W contrast for abd pain, N/V lethargy: IMPRESSION:  1. No acute finding. No bowel obstruction or visible inflammation.  2. Severe left renal  atrophy, nonfunctional.  3. Colonic diverticulosis.   -08/21/2022: CT A/P W contrast: Abd pain: IMPRESSION:  1. Diffuse wall thickening of the esophagus with some nodularity and  irregularity. Please correlate for prior workup. This was seen  previously.  2. Small amount of simple free fluid in the pelvis, nonspecific.  3. Left-sided colonic diverticula without evidence of acute  diverticulitis.  4. Normal appendix.  5. Slight anasarca.  6. Aortic atherosclerosis.  Aortic Atherosclerosis (ICD10-I70.0).   02/02/2023: Total thyroidectomy   GI Medications: Current: Omeprazole 20 mg twice daily   Past Medical History:  Diagnosis Date   Arthritis    Blood dyscrasia    patient stated she is a "free bleeder"   Diabetes mellitus without complication (HCC)    Diverticulosis    Hypothyroidism    Neuropathy    Renal insufficiency     Past Surgical History:  Procedure Laterality Date   ABDOMINAL HYSTERECTOMY     BILATERAL SALPINGECTOMY Bilateral 03/16/2017   Procedure: BILATERAL SALPINGECTOMY;  Surgeon: Schermerhorn, Ihor Austin, MD;  Location: [facility] ORS;  Service: Gynecology;  Laterality: Bilateral;   COLONOSCOPY WITH PROPOFOL N/A 08/18/2018   Procedure: COLONOSCOPY WITH PROPOFOL;  Surgeon: Scot Jun, MD;  Location: [facility] ENDOSCOPY;  Service: Endoscopy;  Laterality: N/A;   CYSTOCELE REPAIR N/A 03/16/2017   Procedure: ANTERIOR REPAIR (CYSTOCELE);  Surgeon: Schermerhorn, Ihor Austin, MD;  Location: [facility] ORS;  Service: Gynecology;  Laterality: N/A;   ESOPHAGOGASTRODUODENOSCOPY (EGD) WITH PROPOFOL N/A 08/18/2018   Procedure: ESOPHAGOGASTRODUODENOSCOPY (EGD) WITH PROPOFOL;  Surgeon: Scot Jun, MD;  Location: [facility] ENDOSCOPY;  Service: Endoscopy;  Laterality: N/A;   TEAR DUCT PROBING Right    TUBAL LIGATION     VAGINAL HYSTERECTOMY N/A 03/16/2017   Procedure: HYSTERECTOMY VAGINAL;  Surgeon: Schermerhorn, Ihor Austin, MD;  Location: [facility] ORS;  Service: Gynecology;  Laterality: N/A;    WISDOM TOOTH EXTRACTION      Family History  Problem Relation Age of Onset   Breast cancer Maternal Aunt    Hypertension Mother    Hypertension Father    Diabetes Father     Social History   Tobacco Use   Smoking status: Never   Smokeless tobacco: Never  Vaping Use   Vaping status: Never Used  Substance Use Topics   Alcohol use: No   Drug use: No    Prior to Admission medications   Medication Sig Start Date End Date Taking? Authorizing Provider  acetaminophen (TYLENOL) 325 MG tablet Take 2 tablets (650 mg total) by mouth every 4 (four) hours as needed for mild pain (pain score 1-3) (temp > 101.5). 04/22/23   Sunnie Nielsen, DO  albuterol (PROVENTIL) (2.5 MG/3ML) 0.083% nebulizer solution Take 3 mLs (2.5 mg total) by nebulization every 2 (two) hours as needed for wheezing. 04/22/23   Sunnie Nielsen, DO  bisacodyl (DULCOLAX) 5 MG EC tablet Take 1 tablet (5 mg total) by mouth daily as needed for moderate constipation. 04/22/23   Sunnie Nielsen, DO  calcium carbonate (OS-CAL - DOSED IN MG OF ELEMENTAL CALCIUM) 1250 (500 Ca) MG tablet Take 1 tablet by mouth daily with breakfast.    [provider]  cholecalciferol (VITAMIN  D3) 25 MCG (1000 UNIT) tablet Take 1,000 Units by mouth daily.    [provider]  docusate sodium (COLACE) 100 MG capsule Take 1 capsule (100 mg total) by mouth 2 (two) times daily as needed for mild constipation. 04/22/23   Sunnie Nielsen, DO  enoxaparin (LOVENOX) 40 MG/0.4ML injection Inject 0.4 mLs (40 mg total) into the skin daily. 04/22/23   Sunnie Nielsen, DO  ertugliflozin L-PyroglutamicAc (STEGLATRO) 15 MG TABS tablet Take 15 mg by mouth daily.    [provider]  gabapentin (NEURONTIN) 100 MG capsule Take 200 mg by mouth 2 (two) times daily.  08/16/15   [provider]  lactase (LACTAID) 3000 units tablet Take 3,000 Units by mouth daily before breakfast.    [provider]  levothyroxine  (SYNTHROID) 100 MCG tablet Take 100 mcg by mouth daily before breakfast.    [provider]  magnesium citrate SOLN Take 296 mLs (1 Bottle total) by mouth once as needed for severe constipation. 04/22/23   Sunnie Nielsen, DO  metoCLOPramide (REGLAN) 10 MG tablet Take 1 tablet (10 mg total) by mouth every 8 (eight) hours as needed for nausea or vomiting. Patient not taking: Reported on 04/14/2023 08/21/22 08/21/23  Dionne Bucy, MD  Mouthwashes (MOUTH RINSE) LIQD solution 15 mLs by Mouth Rinse route as needed (for oral care). 04/22/23   Sunnie Nielsen, DO  Multiple Vitamin (MULTIVITAMIN WITH MINERALS) TABS tablet Take 1 tablet by mouth daily. 04/22/23   Sunnie Nielsen, DO  ondansetron [facility]) 4 MG/2ML SOLN injection Inject 2 mLs (4 mg total) into the vein every 6 (six) hours as needed for nausea. 04/22/23   Sunnie Nielsen, DO  ondansetron (ZOFRAN-ODT) 4 MG disintegrating tablet Take 1 tablet (4 mg total) by mouth every 6 (six) hours as needed for nausea or vomiting. 03/21/22   Ward, Layla Maw, DO  oxybutynin (DITROPAN-XL) 5 MG 24 hr tablet Take 5 mg by mouth at bedtime.    [provider]  oxyCODONE-acetaminophen (PERCOCET/ROXICET) 5-325 MG tablet Take 2 tablets by mouth every 6 (six) hours as needed. Patient not taking: Reported on 04/14/2023 03/21/22   Ward, Layla Maw, DO  pantoprazole (PROTONIX) 40 MG tablet Take 1 tablet (40 mg total) by mouth daily. 04/22/23   Sunnie Nielsen, DO  polyethylene glycol (MIRALAX / GLYCOLAX) 17 g packet Take 17 g by mouth daily as needed for moderate constipation. 04/22/23   Sunnie Nielsen, DO  psyllium (REGULOID) 0.52 g capsule Take 0.52 g by mouth daily.    [provider]  rosuvastatin (CRESTOR) 5 MG tablet Take 5 mg by mouth daily.    [provider]  vitamin B-12 (CYANOCOBALAMIN) 100 MCG tablet Take 100 mcg by mouth daily.    [provider]    Current Facility-Administered Medications   Medication Dose Route Frequency Provider Last Rate Last Admin   acetaminophen (TYLENOL) tablet 325-650 mg  325-650 mg Oral Q4H PRN Milinda Antis, PA-C   650 mg at 04/30/23 0809   alum & mag hydroxide-simeth (MAALOX/MYLANTA) 200-200-20 MG/5ML suspension 30 mL  30 mL Oral Q4H PRN Milinda Antis, PA-C   30 mL at 04/27/23 1527   amantadine (SYMMETREL) capsule 100 mg  100 mg Oral Daily Lovorn, Megan, MD   100 mg at 04/30/23 0802   calcium carbonate (OS-CAL - dosed in mg of elemental calcium) tablet 1,250 mg  1 tablet Oral Q breakfast Milinda Antis, PA-C   1,250 mg at 04/30/23 0802   diclofenac Sodium (VOLTAREN)  1 % topical gel 2 g  2 g Topical QID Lovorn, Megan, MD   2 g at 04/30/23 1133   diphenhydrAMINE (BENADRYL) capsule 25 mg  25 mg Oral Q6H PRN Milinda Antis, PA-C       enoxaparin (LOVENOX) injection 40 mg  40 mg Subcutaneous Q24H Milinda Antis, PA-C   40 mg at 04/29/23 2053   gabapentin (NEURONTIN) capsule 100 mg  100 mg Oral BID Lovorn, Aundra Millet, MD   100 mg at 04/30/23 0802   guaiFENesin-dextromethorphan (ROBITUSSIN DM) 100-10 MG/5ML syrup 10 mL  10 mL Oral Q6H PRN Milinda Antis, PA-C   10 mL at 04/25/23 2155   lactase (LACTAID) tablet 3,000 Units  3,000 Units Oral QAC breakfast Milinda Antis, PA-C   3,000 Units at 04/30/23 6578   levothyroxine (SYNTHROID) tablet 100 mcg  100 mcg Oral QAC breakfast Milinda Antis, PA-C   100 mcg at 04/30/23 0604   lidocaine (XYLOCAINE) 2 % jelly 1 Application  1 Application Urethral PRN Milinda Antis, PA-C       megestrol (MEGACE) 400 MG/10ML suspension 800 mg  800 mg Oral Daily Lovorn, Megan, MD       melatonin tablet 5 mg  5 mg Oral QHS Milinda Antis, PA-C   5 mg at 04/29/23 2048   methocarbamol (ROBAXIN) tablet 500 mg  500 mg Oral Q6H PRN Milinda Antis, PA-C   500 mg at 04/28/23 2205   midodrine (PROAMATINE) tablet 5 mg  5 mg Oral TID WC Lovorn, Megan, MD   5 mg at 04/30/23 0802   multivitamin with minerals tablet 1 tablet  1  tablet Oral Daily Milinda Antis, PA-C   1 tablet at 04/30/23 0802   ondansetron (ZOFRAN) tablet 4 mg  4 mg Oral Q6H PRN Milinda Antis, PA-C   4 mg at 04/29/23 1752   Or   ondansetron (ZOFRAN) injection 4 mg  4 mg Intravenous Q6H PRN Milinda Antis, PA-C   4 mg at 04/29/23 0741   pantoprazole (PROTONIX) EC tablet 40 mg  40 mg Oral Daily Milinda Antis, PA-C   40 mg at 04/30/23 0802   polyethylene glycol (MIRALAX / GLYCOLAX) packet 17 g  17 g Oral Daily PRN Milinda Antis, PA-C   17 g at 04/25/23 2158   psyllium (HYDROCIL/METAMUCIL) 1 packet  1 packet Oral Daily Milinda Antis, PA-C   1 packet at 04/30/23 0801   rosuvastatin (CRESTOR) tablet 5 mg  5 mg Oral Daily Milinda Antis, PA-C   5 mg at 04/30/23 4696   senna-docusate (Senokot-S) tablet 1 tablet  1 tablet Oral QHS Fanny Dance, MD   1 tablet at 04/29/23 2048   sorbitol 70 % solution 30 mL  30 mL Oral Daily PRN Milinda Antis, PA-C       sorbitol 70 % solution 45 mL  45 mL Oral Once Lovorn, Aundra Millet, MD       traMADol Janean Sark) tablet 50 mg  50 mg Oral Q12H PRN Lovorn, Aundra Millet, MD        Allergies as of 04/22/2023 - Reviewed 04/22/2023  Allergen Reaction Noted   Augmentin [amoxicillin-pot clavulanate] Itching 08/11/2021   Ampicillin Rash 04/13/2023     Review of Systems:    Constitutional: No weight loss, fever or chills HEENT: Eyes: No change in vision               Ears, Nose, Throat:  No change in  hearing or congestion Skin: No rash  Cardiovascular: No chest pain Respiratory: No SOB Gastrointestinal: See HPI and otherwise negative Genitourinary: No dysuria  Neurological: No headache, dizziness or syncope Musculoskeletal: No new muscle or joint pain Hematologic: No bleeding  Psychiatric: No history of depression or anxiety    Physical Exam:  Vital signs in last 24 hours: Temp:  [97.8 F (36.6 C)-98.2 F (36.8 C)] 97.8 F (36.6 C) (10/24 0353) Pulse Rate:  [74-80] 79 (10/24 0353) Resp:  [17-18] 18 (10/24  0353) BP: (106-126)/(56-68) 106/58 (10/24 0353) SpO2:  [93 %-98 %] 93 % (10/24 0353) Last BM Date : 04/28/23 General:   Pleasant Elderly Caucasian female appears to be in NAD, Well developed, Well nourished, alert and cooperative Head:  Normocephalic and atraumatic. Eyes:   PEERL, EOMI. No icterus. Conjunctiva pink. Ears:  Normal auditory acuity. Neck:  Supple Throat: Oral cavity and pharynx without inflammation, swelling or lesion. Teeth in good condition. Lungs: Respirations even and unlabored. Lungs clear to auscultation bilaterally.   No wheezes, crackles, or rhonchi.  Heart: Normal S1, S2. No MRG. Regular rate and rhythm. No peripheral edema, cyanosis or pallor.  Abdomen:  Soft, nondistended, nontender. No rebound or guarding. Normal bowel sounds. No appreciable masses or hepatomegaly. Rectal:  Not performed.  Msk:  Symmetrical without gross deformities. Peripheral pulses intact.  Extremities:  Without edema, no deformity or joint abnormality. Normal ROM, normal sensation. Neurologic:  Alert and  oriented x4;  grossly normal neurologically.  Skin:   Dry and intact without significant lesions or rashes. Psychiatric: Demonstrates good judgement and reason without abnormal affect or behaviors.  LAB RESULTS: No results for input(s): "WBC", "HGB", "HCT", "PLT" in the last 72 hours. BMET Recent Labs    04/28/23 0744  NA 138  K 4.5  CL 100  CO2 25  GLUCOSE 179*  BUN 25*  CREATININE 1.27*  CALCIUM 9.4   LFT No results for input(s): "PROT", "ALBUMIN", "AST", "ALT", "ALKPHOS", "BILITOT", "BILIDIR", "IBILI" in the last 72 hours. PT/INR No results for input(s): "LABPROT", "INR" in the last 72 hours.  STUDIES: DG Swallowing Func-Speech Pathology  Result Date: 04/29/2023 Table formatting from the original result was not included. Modified Barium Swallow Study Patient Details Name: Shae-Lynn Chila MRN: [id number] Date of Birth: March 05, 1956 Today's Date: 04/29/2023 HPI/PMH: HPI: Pt is a  67 year old female presents with the chief complaint of cervical spinal cord injury after a fall, admitted with cervical spinal cord injury central vs hemicord pattern; Acute Fracture of the right superior and inferior pubic rami- WBAT    PMH significant for Arthritis, Blood dyscrasia, Diabetes mellitus without complication (HCC), Diverticulosis, Hypothyroidism, Neuropathy, and Renal insufficiency. Per patient interview, patient underwent thyroid surgery in January 2024 and saw outpatient GI in September of 2024 with recommendations for further esophageal workup. Patient reports globus sensation with all solids at the level of the upper sternum. Recommended MBS to determine esophageal vs. oropharyngeal swallowing involvement. Clinical Impression: Clinical Impression: Patient presents with mild oropharyngeal dysphagia primarily impacted by upwards impact of esophageal deficits. Orally, patient underwent examination without dentures, thus oral deficits of prolonged mastication and bolus preparation were observed with regular solids. Patient at bedside reports need to "bear down" to swallow, and given esophageal retention and stasis observed at multiple levels, suspect impaired pressure system across the overall swallow has resulted in delay in lingual motion to transport bolus posteriorly in the oral cavity and reduced pharyngeal constriction. Reduced constriction results in diffuse pharyngeal residue with thin liquids  and residue in the vallecula with all solids. Solid residue spontaneously clears with additional reflexive swallows. Residue of thin liquids appeared to trickle in to the laryngeal vestibule during chewing of regular solids, though residue never touched the vocal folds and was eventually ejected with spontaneous swallow/throat clear. During esophageal screening, observed esophageal stasis/retention in the cervical esophagus and above the lower esophagel sphincter with all solids. Mid-esophagus appeared  tortuous in shape as bolus material travelled. Confounding esophageal impact on oropharyngeal swallow, patient exhibits tissue changes in the lower pharynx from thryoid surgery and placement of subsequent hardware, resulting in minimally reduced epiglottic inversion, though no penetration/aspiration observed during the swallow. Recommend continuation of Dys3/thin liquid diet. Instructed patient to intermittently throat clear/reswallow, though patient observed to complete independently of instruction during study. Administer medications whole in puree to assist with pharyngeal transit. Alerted patient to recommendations for GI and ENT to assess impact of both esophageal function and thryoid surgery sequelae on overall swallowing function and address as able. SLP will plan to follow up x1 to assess use of compensatory strategies, then will likely sign off. Patient agreeable to this plan. Factors that may increase risk of adverse event in presence of aspiration Rubye Oaks & Clearance Coots 2021): No data recorded Recommendations/Plan: Swallowing Evaluation Recommendations Swallowing Evaluation Recommendations Recommendations: PO diet PO Diet Recommendation: Dysphagia 3 (Mechanical soft); Thin liquids (Level 0) Liquid Administration via: Cup; Straw Medication Administration: Whole meds with puree Supervision: Patient able to self-feed; Intermittent supervision/cueing for swallowing strategies Swallowing strategies  : Slow rate; Clear throat intermittently Postural changes: Stay upright 30-60 min after meals; Position pt fully upright for meals Oral care recommendations: Oral care BID (2x/day) Recommended consults: Consider ENT consultation; Consider GI consultation; Consider esophageal assessment Treatment Plan Treatment Plan Treatment recommendations: Therapy as outlined in treatment plan below Follow-up recommendations: No SLP follow up Functional status assessment: Patient has had a recent decline in their functional status and  demonstrates the ability to make significant improvements in function in a reasonable and predictable amount of time. Treatment frequency: Min 1x/week Treatment duration: 1 week Interventions: Compensatory techniques; Patient/family education; Diet toleration management by SLP Recommendations Recommendations for follow up therapy are one component of a multi-disciplinary discharge planning process, led by the attending physician.  Recommendations may be updated based on patient status, additional functional criteria and insurance authorization. Assessment: Orofacial Exam: Orofacial Exam Oral Cavity: Oral Hygiene: WFL Oral Cavity - Dentition: Edentulous Orofacial Anatomy: WFL Oral Motor/Sensory Function: Generalized oral weakness Anatomy: Anatomy: -- (presence of hardware and suspected tissue changes near lower trachea/upper esophageal sphinctor secondary to thyroid procedure) Boluses Administered: Boluses Administered Boluses Administered: Thin liquids (Level 0); Puree; Solid  Oral Impairment Domain: Oral Impairment Domain Lip Closure: No labial escape Tongue control during bolus hold: Cohesive bolus between tongue to palatal seal Bolus preparation/mastication: Slow prolonged chewing/mashing with complete recollection Bolus transport/lingual motion: Delayed initiation of tongue motion (oral holding); Slow tongue motion Oral residue: Residue collection on oral structures Location of oral residue : Tongue; Palate Initiation of pharyngeal swallow : Posterior laryngeal surface of the epiglottis  Pharyngeal Impairment Domain: Pharyngeal Impairment Domain Soft palate elevation: No bolus between soft palate (SP)/pharyngeal wall (PW) Laryngeal elevation: Complete superior movement of thyroid cartilage with complete approximation of arytenoids to epiglottic petiole Anterior hyoid excursion: Complete anterior movement Epiglottic movement: Partial inversion Laryngeal vestibule closure: Incomplete, narrow column air/contrast in  laryngeal vestibule Pharyngeal stripping wave : Present - diminished Pharyngeal contraction (A/P view only): N/A Pharyngoesophageal segment opening: Partial distention/partial duration, partial  obstruction of flow Tongue base retraction: Trace column of contrast or air between tongue base and PPW Pharyngeal residue: Collection of residue within or on pharyngeal structures Location of pharyngeal residue: Diffuse (>3 areas)  Esophageal Impairment Domain: Esophageal Impairment Domain Esophageal clearance upright position: Esophageal retention Pill: No data recorded Penetration/Aspiration Scale Score: Penetration/Aspiration Scale Score 1.  Material does not enter airway: Puree; Solid 3.  Material enters airway, remains ABOVE vocal cords and not ejected out: Thin liquids (Level 0) Compensatory Strategies: Compensatory Strategies Compensatory strategies: Yes Other(comment): Effective (intermittent throat clear/reswallow) Effective Other(comment): Thin liquid (Level 0)   General Information: Caregiver present: No  Diet Prior to this Study: Thin liquids (Level 0); Dysphagia 3 (mechanical soft)   Temperature : Normal   Respiratory Status: WFL   Supplemental O2: None (Room air)   History of Recent Intubation: No  Behavior/Cognition: Alert; Cooperative Self-Feeding Abilities: Able to self-feed Baseline vocal quality/speech: Hypophonia/low volume Volitional Cough: Able to elicit Volitional Swallow: Able to elicit Exam Limitations: No limitations (limited bolus trials administered given patient's morning of nausea) Goal Planning: Prognosis for improved oropharyngeal function: Guarded No data recorded Barriers/Prognosis Comment: oropharyngeal dysphagia is impacted by suspected esophageal deficits and sequelae from thyroid procedure Patient/Family Stated Goal: did not state Consulted and agree with results and recommendations: Patient Pain: Pain Assessment Pain Assessment: Faces Faces Pain Scale: 0 End of Session: Start Time:No  data recorded Stop Time: No data recorded Time Calculation:No data recorded Charges: No data recorded SLP visit diagnosis: SLP Visit Diagnosis: Dysphagia, oropharyngeal phase (R13.12) Past Medical History: Past Medical History: Diagnosis Date  Arthritis   Blood dyscrasia   patient stated she is a "free bleeder"  Diabetes mellitus without complication (HCC)   Diverticulosis   Hypothyroidism   Neuropathy   Renal insufficiency  Past Surgical History: Past Surgical History: Procedure Laterality Date  ABDOMINAL HYSTERECTOMY    BILATERAL SALPINGECTOMY Bilateral 03/16/2017  Procedure: BILATERAL SALPINGECTOMY;  Surgeon: Schermerhorn, Ihor Austin, MD;  Location: [facility] ORS;  Service: Gynecology;  Laterality: Bilateral;  COLONOSCOPY WITH PROPOFOL N/A 08/18/2018  Procedure: COLONOSCOPY WITH PROPOFOL;  Surgeon: Scot Jun, MD;  Location: [facility] ENDOSCOPY;  Service: Endoscopy;  Laterality: N/A;  CYSTOCELE REPAIR N/A 03/16/2017  Procedure: ANTERIOR REPAIR (CYSTOCELE);  Surgeon: Schermerhorn, Ihor Austin, MD;  Location: [facility];  Service: Gynecology;  Laterality: N/A;  ESOPHAGOGASTRODUODENOSCOPY (EGD) WITH PROPOFOL N/A 08/18/2018  Procedure: ESOPHAGOGASTRODUODENOSCOPY (EGD) WITH PROPOFOL;  Surgeon: Scot Jun, MD;  Location: [facility];  Service: Endoscopy;  Laterality: N/A;  TEAR DUCT PROBING Right   TUBAL LIGATION    VAGINAL HYSTERECTOMY N/A 03/16/2017  Procedure: HYSTERECTOMY VAGINAL;  Surgeon: Schermerhorn, Ihor Austin, MD;  Location: [facility];  Service: Gynecology;  Laterality: N/A;  WISDOM TOOTH EXTRACTION   Jeannie Done, M.A., CCC-SLP Yetta Barre 04/29/2023, 11:53 AM     Impression / Plan:   Impression: 1.  Dysphagia and a dyne aphasia: Prior Schatzki's ring dilated in February 2020, recent fall and cervical cord injury, prior thyroidectomy, MBS with impaired oropharyngeal swallow as well as altered mucosa in the esophagus per their report, also reports painful swallowing; likely multifactorial including  Schatzki's ring +/- candidiasis 2.  Incomplete quadriplegia with functional deficits  Plan: 1.  Plan for EGD with dilation tomorrow with Dr. Adela Lank.  Did discuss risks, benefits, limitations and alternatives and the patient agrees to proceed. 2.  Patient will be on her dysphagia 3 diet today and n.p.o. at midnight 3.  Hold dose of Lovenox this evening 10/24 4.  Continue Pantoprazole 40 mg p.o. daily 5.  Ordered viscous lidocaine as needed for the patient given her throat pain  Thank you for your kind consultation, we will continue to follow.  Violet Baldy Jaiyana Canale  04/30/2023, 1:37 PM

## 2023-04-30 NOTE — Progress Notes (Signed)
Physical Therapy Weekly Progress Note  Patient Details  Name: [name] MRN: [id number] Date of Birth: 12-28-1955  Beginning of progress report period: April 23, 2023 End of progress report period: April 30, 2023  Today's Date: 04/30/2023 PT Individual Time: 4098-1191 PT Individual Time Calculation (min): 70 min   Patient has met 1 of 3 short term goals.  Pt is making slow but steady progress towards goals. Pt continues to be most limited by [facility] and limited tolerance to OOB. Pt currently requires abdominal binder, TED hose, and ace bandages, with ongoing education to work on OOB tolerance and increased mobility for BP management. Pt is able to tolerate 2-3hours OOB consistently. Pt's movement is overall minA to CGA and noted to be slow and guarded  but demonstrates overall fair safety. Pt is able to ambulate bouts of up to 40ft with RW and had performed small step ups in order to step on scale.   Patient continues to demonstrate the following deficits muscle weakness and muscle joint tightness and impaired timing and sequencing, unbalanced muscle activation, and decreased coordination and therefore will continue to benefit from skilled PT intervention to increase functional independence with mobility.  Patient progressing toward long term goals..  Continue plan of care.  PT Short Term Goals Week 1:  PT Short Term Goal 1 (Week 1): Pt will tolerate sitting upright OOB >4 hours PT Short Term Goal 1 - Progress (Week 1): Progressing toward goal PT Short Term Goal 2 (Week 1): Pt will tolerate standing more than 2 min without changes in vital signs PT Short Term Goal 2 - Progress (Week 1): Progressing toward goal PT Short Term Goal 3 (Week 1): Pt will ambulate >30 ft with LRAD PT Short Term Goal 3 - Progress (Week 1): Met Week 2:  PT Short Term Goal 1 (Week 2): pt will ambulate >50 ft with LRAD PT Short Term Goal 2 (Week 2): pt will tolerate sitting OOB >4 hours PT Short Term Goal 3 (Week  2): Pt will tolerate standing > 2 min without changes in vital signs.  Skilled Therapeutic Interventions/Progress Updates:  Ambulation/gait training;Cognitive remediation/compensation;DME/adaptive equipment instruction;Discharge planning;Pain management;Psychosocial support;Functional mobility training;Splinting/orthotics;Therapeutic Activities;UE/LE Strength taining/ROM;UE/LE Coordination activities;Wheelchair propulsion/positioning;Therapeutic Exercise;Stair training;Skin care/wound management;Patient/family education;Neuromuscular re-education;Functional electrical stimulation;Disease management/prevention;Balance/vestibular training;Community reintegration   Therapy Documentation Precautions:  Precautions Precautions: Cervical, Fall Precaution Comments: verbally reviewed brace wear Required Braces or Orthoses: Cervical Brace Cervical Brace: Hard collar Restrictions Weight Bearing Restrictions: Yes RLE Weight Bearing: Weight bearing as tolerated LLE Weight Bearing: Weight bearing as tolerated Other Position/Activity Restrictions: per orders, ok to donn collar edge of bed, amb to bathroom/shower without c collar General:      Therapy/Group: Individual Therapy  Rosita DeChalus 04/30/2023, 4:10 PM

## 2023-04-30 NOTE — Progress Notes (Signed)
Speech Language Pathology Discharge Summary  Patient Details  Name: [name] MRN: [id number] Date of Birth: 02/06/1956  Date of Discharge from SLP service:April 30, 2023  Today's Date: 04/30/2023 SLP Individual Time: 1304-1400 SLP Individual Time Calculation (min): 56 min   Skilled Therapeutic Interventions:  SLP conducted skilled therapy session targeting dysphagia management goals. Patient assessed with Dys3/thin liquid lunch tray. Patient observed to intermittently clear throat throughout meal as previously discussed but continues to exhibit limited intake d/t discomfort in the esophagus. Patient requests GI assessment. Via messaging with MD, MD reports she has consulted GI to assess patient's esophageal complaints. Recommend continuation of current diet from an oropharyngeal standpoint as no overt s/sx of penetration/aspiration were observed across trials. SLP will sign off as patient has met all SLP goals and is no longer in need of SLP services. Patient was left in lowered bed with call bell in reach and bed alarm set. See discharge summary below.  Patient has met 1 of 1 long term goals.  Patient to discharge at overall Modified Independent level.  Reasons goals not met: n/a   Clinical Impression/Discharge Summary: Patient has met all goals set for SLP therapy during admission and no longer requires SLP CIR services. Patient is at an overall modI level for eating and utilizes all recommended strategies without prompting from SLP. As patient's oropharyngeal swallowing deficits are likely sequelae of suspected esophageal impairments, recommend patient see GI to assess esophageal swallowing function. Upon discharge, recommend ENT consult to assess throat discomfort given patient's history of thyroid difficulty. No SLP services required at this time upon discharge. Patient education complete. SLP will sign off.   Care Partner:  Caregiver Able to Provide Assistance: Other (comment) (none  needed)     Recommendation:  None      Equipment: n/a   Reasons for discharge: Treatment goals met   Patient/Family Agrees with Progress Made and Goals Achieved: Yes   Jeannie Done, M.A., CCC-SLP   Yetta Barre 04/30/2023, 3:51 PM

## 2023-04-30 NOTE — Progress Notes (Signed)
Physical Therapy Session Note  Patient Details  Name: [name] MRN: [id number] Date of Birth: [birth year]  Today's Date: 04/30/2023 PT Individual Time: 1005-1115 PT Individual Time Calculation (min): 70 min   Short Term Goals: Week 1:  PT Short Term Goal 1 (Week 1): Pt will tolerate sitting upright OOB >4 hours PT Short Term Goal 2 (Week 1): Pt will tolerate standing more than 2 min without changes in vital signs PT Short Term Goal 3 (Week 1): Pt will ambulate >30 ft with LRAD  Skilled Therapeutic Interventions/Progress Updates: Pt presented in recliner agreeable to therapy. Pt c/o unrated pain in pelvic area. Rest and repositioning provided as needed. BP taken at start of session 115/58 (75) HR 76. Pt performed ankle pumps, heel slides, and hip abd/add prior to mobility. Pt stood with CGA and increased effort. Upon standing pt indicated need to bathroom. Ambulated with RW and CGA to toilet and completed LB clothing management with CGA (+urinary void). Pt stood and required minA for LB clothing management due to difficulty managing brief. Pt then ambulated to w/c in same manner as prior and transported to day room. Per discussion with pt curious to current weight. Set up with scale and pt was able to step onto scale with CGA. Pt then ambulated ~69ft to mat with CGA. BP assessed 108/57 (73) HR 82. Pt then participated in Sit to stand x5 for BLE strengthening with emphasis on not bracing BLE against mat. Pt then ambulated 29ft back to w/c. Pt with mildly antalgic gait but overall CGA. Pt transported remaining distance back to room and completed ambulatory transfer to bed. PTA doffed collar and abdominal binder total A. Pt completed sit to supine with CGA and use of bed rail only. Pt then indicated increased lightheadedness/dizziness. BP assess 131/52, nsg notified BG checked 147. Pt left in bed in sidelying with call bell within reach, bed alarm on, and needs met.      Therapy  Documentation Precautions:  Precautions Precautions: Cervical, Fall Precaution Comments: verbally reviewed brace wear Required Braces or Orthoses: Cervical Brace Cervical Brace: Hard collar Restrictions Weight Bearing Restrictions: Yes RLE Weight Bearing: Weight bearing as tolerated LLE Weight Bearing: Weight bearing as tolerated Other Position/Activity Restrictions: per orders, ok to donn collar edge of bed, amb to bathroom/shower without c collar General:   Vital Signs: Therapy Vitals Temp: 97.6 F (36.4 C) Temp Source: Oral Pulse Rate: 84 Resp: 18 BP: (!) 114/53 Patient Position (if appropriate): Lying Oxygen Therapy SpO2: 95 % O2 Device: Room Air Pain: Pain Assessment Pain Scale: Faces Pain Score: 10-Worst pain ever Faces Pain Scale: Hurts even more Pain Type: Acute pain Pain Location: Leg Pain Orientation: Right;Left Pain Radiating Towards: toes to hips Pain Descriptors / Indicators: Burning;Stabbing;Sharp Pain Frequency: Constant Pain Onset: Progressive Pain Intervention(s): Medication (See eMAR)   Therapy/Group: Individual Therapy  Orlyn Odonoghue 04/30/2023, 4:07 PM

## 2023-04-30 NOTE — Progress Notes (Signed)
Occupational Therapy Session Note  Patient Details  Name: [name] MRN: [id number] Date of Birth: 10-Dec-1955  Today's Date: 04/30/2023 OT Individual Time: 0745-0900 OT Individual Time Calculation (min): 75 min    Short Term Goals: Week 1:  OT Short Term Goal 1 (Week 1): Patient to perform toilet transfer with Min assist OT Short Term Goal 2 (Week 1): Patient to perform LB dressing with AE as needed with Min assist OT Short Term Goal 3 (Week 1): Patient to perform bathing with AE as needed with Min assist OT Short Term Goal 4 (Week 1): Patient to perform grooming with set up assist sitting at sink  Skilled Therapeutic Interventions/Progress Updates:    Pt alert and eating breakfast in bed upon arrival. Pt much more alert and responsive this morning. Dependent for donning thigh high Teds and BLE Ace wraps. Supine>sit EOB with supervision using bed rails and HOB elevated. Pt donned pullover shirt with supervision while seated EOB. Min A for donning pants. Sit<>stand from EOB with RW at [facility]. Stand step transfer to recliner with CGA. Pt requires more then a reasonal amount of time to complete all tasks. Pt with difficulty swallowing. Pt encouraged to drink Miralax. Pt remained in recliner with all needs within reach. Belt alarm activated.   Therapy Documentation Precautions:  Precautions Precautions: Cervical, Fall Precaution Comments: verbally reviewed brace wear Required Braces or Orthoses: Cervical Brace Cervical Brace: Hard collar Restrictions Weight Bearing Restrictions: Yes RLE Weight Bearing: Weight bearing as tolerated LLE Weight Bearing: Weight bearing as tolerated Other Position/Activity Restrictions: per orders, ok to donn collar edge of bed, amb to bathroom/shower without c collar   Pain: Pt c/o 4/10 Rt hip pain and Rt great toe; MD aware, meds admin during session  Therapy/Group: Individual Therapy  Rich Brave 04/30/2023, 9:27 AM

## 2023-04-30 NOTE — Consult Note (Addendum)
Consultation  Referring Provider:   Dr. Berline Chough Primary Care Physician:  Jerl Mina, MD Primary Gastroenterologist: [facility]        Reason for Consultation: Dysphagia              HPI:   [name] is a 67 y.o. female with a past medical history as listed below including type 2 diabetes, hypothyroidism, total thyroidectomy on 01/24/2023 for invasive follicular thyroid carcinoma, diverticulitis, GERD, colon polyps and multiple others, we are asked to see her today in regards to dysphagia.    04/22/2023 patient initially admitted to rehab here, she initially presented to the hospital on 04/13/2023 after falling backward and striking the right side of her head.  Neurosurgery was consulted and felt most likely cervical spinal cord injury mostly in a central versus hemicord pattern.,  Hypotension treated with Levophed.  Continued to have multiple episodes of orthostatic hypotension receiving normal saline fluid boluses.  Started on Heparin SubQ for DVT prophylaxis and also on Percocet for pain control.  Told to follow-up outpatient with neurosurgery.    04/29/2023 modified barium swallow with oropharyngeal dysphagia.  At that time they suspected impaired pressure system across the overall swallow had resulted in delay in lingual motion to transport bolus posteriorly into the oral cavity and reduced pharyngeal constriction, midesophagus appeared tortuous and shape, tissue changes in the lower pharynx from thyroid surgery and placement of subsequent hardware, continued on a dysphagia 3/thin liquid diet.    04/30/2023 note from rehab today discusses that she ate less than 25% of her tray and did not have an appetite and was having "swallowing discomfort".    Today, patient explains that she has had trouble swallowing for a few months, apparently describes that about once a month she will have an acute episode where she eats and then gets a stinging sensation in her lower esophagus that  resulted in hiccups and has to run to the bathroom to spit up clear liquid, this occurs about once a month, but daily now she feels some burning and stinging just over her sternum at the bottom of her esophagus regardless of what she is putting her mouth, water or food.  This hinders her want to eat rated at times as a 10/10.    Denies fever, chills or weight loss.     GI history: 03/18/2023 office visit with GI at [facility], discussed history of previous polyps and reflux with dysphagia and history of Schatzki's ring.at that visit they discussed dysphagia and requested an EGD with dilation.  Apparently had been set up for these procedures twice within the past year and had to cancel due to a clavicle fracture and more recently status post thyroid cancer with thyroidectomy in July.  At that time they recommended follow-up with PCP for some trouble breathing.  Recommended a barium swallow and continue Omeprazole daily.  Procedures planned for 2025 including EGD and colonoscopy.    Previous summary of evaluation as below from their note:  - 2010: CSY (@ [facility]) - TVA with focal high-grade dysplasia at hepatic flexure, SA in transverse colon, TA in sigmoid colon.  - 09/19/09: CSY (@ [facility]) - TA & SSA at hepatic flexure, hyperplastic polyp in sigmoid colon. Patient follow up for post polypectomy bleeding 2 weeks after a colonoscopy with 2 large polyps removed.   - 08/18/2018: colonoscopy for hx of polyps: One 14 mm TA polyp ascending colon, one med sessile serrated polyp hepatic flexure, diverticulosis.  - 08/18/2018:  EGD for dysphagia/GERD: Mild Schatzki ring, dilated, gastritis, erythematous duodenopathy: Path: preserved villous architecture with no histopath changes. And gastric antral mild reactive and reparative changes. Neg H pylori.  - 01/13/2021: CTA/P W contrast for abd pain, N/V lethargy: IMPRESSION:  1. No acute finding. No bowel obstruction or visible inflammation.  2. Severe left renal  atrophy, nonfunctional.  3. Colonic diverticulosis.   -08/21/2022: CT A/P W contrast: Abd pain: IMPRESSION:  1. Diffuse wall thickening of the esophagus with some nodularity and  irregularity. Please correlate for prior workup. This was seen  previously.  2. Small amount of simple free fluid in the pelvis, nonspecific.  3. Left-sided colonic diverticula without evidence of acute  diverticulitis.  4. Normal appendix.  5. Slight anasarca.  6. Aortic atherosclerosis.  Aortic Atherosclerosis (ICD10-I70.0).   02/02/2023: Total thyroidectomy   GI Medications: Current: Omeprazole 20 mg twice daily   Past Medical History:  Diagnosis Date   Arthritis    Blood dyscrasia    patient stated she is a "free bleeder"   Diabetes mellitus without complication (HCC)    Diverticulosis    Hypothyroidism    Neuropathy    Renal insufficiency     Past Surgical History:  Procedure Laterality Date   ABDOMINAL HYSTERECTOMY     BILATERAL SALPINGECTOMY Bilateral 03/16/2017   Procedure: BILATERAL SALPINGECTOMY;  Surgeon: Schermerhorn, Ihor Austin, MD;  Location: [facility] ORS;  Service: Gynecology;  Laterality: Bilateral;   COLONOSCOPY WITH PROPOFOL N/A 08/18/2018   Procedure: COLONOSCOPY WITH PROPOFOL;  Surgeon: Scot Jun, MD;  Location: [facility] ENDOSCOPY;  Service: Endoscopy;  Laterality: N/A;   CYSTOCELE REPAIR N/A 03/16/2017   Procedure: ANTERIOR REPAIR (CYSTOCELE);  Surgeon: Schermerhorn, Ihor Austin, MD;  Location: [facility] ORS;  Service: Gynecology;  Laterality: N/A;   ESOPHAGOGASTRODUODENOSCOPY (EGD) WITH PROPOFOL N/A 08/18/2018   Procedure: ESOPHAGOGASTRODUODENOSCOPY (EGD) WITH PROPOFOL;  Surgeon: Scot Jun, MD;  Location: [facility] ENDOSCOPY;  Service: Endoscopy;  Laterality: N/A;   TEAR DUCT PROBING Right    TUBAL LIGATION     VAGINAL HYSTERECTOMY N/A 03/16/2017   Procedure: HYSTERECTOMY VAGINAL;  Surgeon: Schermerhorn, Ihor Austin, MD;  Location: [facility] ORS;  Service: Gynecology;  Laterality: N/A;    WISDOM TOOTH EXTRACTION      Family History  Problem Relation Age of Onset   Breast cancer Maternal Aunt    Hypertension Mother    Hypertension Father    Diabetes Father     Social History   Tobacco Use   Smoking status: Never   Smokeless tobacco: Never  Vaping Use   Vaping status: Never Used  Substance Use Topics   Alcohol use: No   Drug use: No    Prior to Admission medications   Medication Sig Start Date End Date Taking? Authorizing Provider  acetaminophen (TYLENOL) 325 MG tablet Take 2 tablets (650 mg total) by mouth every 4 (four) hours as needed for mild pain (pain score 1-3) (temp > 101.5). 04/22/23   Sunnie Nielsen, DO  albuterol (PROVENTIL) (2.5 MG/3ML) 0.083% nebulizer solution Take 3 mLs (2.5 mg total) by nebulization every 2 (two) hours as needed for wheezing. 04/22/23   Sunnie Nielsen, DO  bisacodyl (DULCOLAX) 5 MG EC tablet Take 1 tablet (5 mg total) by mouth daily as needed for moderate constipation. 04/22/23   Sunnie Nielsen, DO  calcium carbonate (OS-CAL - DOSED IN MG OF ELEMENTAL CALCIUM) 1250 (500 Ca) MG tablet Take 1 tablet by mouth daily with breakfast.    [provider]  cholecalciferol (VITAMIN  D3) 25 MCG (1000 UNIT) tablet Take 1,000 Units by mouth daily.    [provider]  docusate sodium (COLACE) 100 MG capsule Take 1 capsule (100 mg total) by mouth 2 (two) times daily as needed for mild constipation. 04/22/23   Sunnie Nielsen, DO  enoxaparin (LOVENOX) 40 MG/0.4ML injection Inject 0.4 mLs (40 mg total) into the skin daily. 04/22/23   Sunnie Nielsen, DO  ertugliflozin L-PyroglutamicAc (STEGLATRO) 15 MG TABS tablet Take 15 mg by mouth daily.    [provider]  gabapentin (NEURONTIN) 100 MG capsule Take 200 mg by mouth 2 (two) times daily.  08/16/15   [provider]  lactase (LACTAID) 3000 units tablet Take 3,000 Units by mouth daily before breakfast.    [provider]  levothyroxine  (SYNTHROID) 100 MCG tablet Take 100 mcg by mouth daily before breakfast.    [provider]  magnesium citrate SOLN Take 296 mLs (1 Bottle total) by mouth once as needed for severe constipation. 04/22/23   Sunnie Nielsen, DO  metoCLOPramide (REGLAN) 10 MG tablet Take 1 tablet (10 mg total) by mouth every 8 (eight) hours as needed for nausea or vomiting. Patient not taking: Reported on 04/14/2023 08/21/22 08/21/23  Dionne Bucy, MD  Mouthwashes (MOUTH RINSE) LIQD solution 15 mLs by Mouth Rinse route as needed (for oral care). 04/22/23   Sunnie Nielsen, DO  Multiple Vitamin (MULTIVITAMIN WITH MINERALS) TABS tablet Take 1 tablet by mouth daily. 04/22/23   Sunnie Nielsen, DO  ondansetron [facility]) 4 MG/2ML SOLN injection Inject 2 mLs (4 mg total) into the vein every 6 (six) hours as needed for nausea. 04/22/23   Sunnie Nielsen, DO  ondansetron (ZOFRAN-ODT) 4 MG disintegrating tablet Take 1 tablet (4 mg total) by mouth every 6 (six) hours as needed for nausea or vomiting. 03/21/22   Ward, Layla Maw, DO  oxybutynin (DITROPAN-XL) 5 MG 24 hr tablet Take 5 mg by mouth at bedtime.    [provider]  oxyCODONE-acetaminophen (PERCOCET/ROXICET) 5-325 MG tablet Take 2 tablets by mouth every 6 (six) hours as needed. Patient not taking: Reported on 04/14/2023 03/21/22   Ward, Layla Maw, DO  pantoprazole (PROTONIX) 40 MG tablet Take 1 tablet (40 mg total) by mouth daily. 04/22/23   Sunnie Nielsen, DO  polyethylene glycol (MIRALAX / GLYCOLAX) 17 g packet Take 17 g by mouth daily as needed for moderate constipation. 04/22/23   Sunnie Nielsen, DO  psyllium (REGULOID) 0.52 g capsule Take 0.52 g by mouth daily.    [provider]  rosuvastatin (CRESTOR) 5 MG tablet Take 5 mg by mouth daily.    [provider]  vitamin B-12 (CYANOCOBALAMIN) 100 MCG tablet Take 100 mcg by mouth daily.    [provider]    Current Facility-Administered Medications   Medication Dose Route Frequency Provider Last Rate Last Admin   acetaminophen (TYLENOL) tablet 325-650 mg  325-650 mg Oral Q4H PRN Milinda Antis, PA-C   650 mg at 04/30/23 0809   alum & mag hydroxide-simeth (MAALOX/MYLANTA) 200-200-20 MG/5ML suspension 30 mL  30 mL Oral Q4H PRN Milinda Antis, PA-C   30 mL at 04/27/23 1527   amantadine (SYMMETREL) capsule 100 mg  100 mg Oral Daily Lovorn, Megan, MD   100 mg at 04/30/23 0802   calcium carbonate (OS-CAL - dosed in mg of elemental calcium) tablet 1,250 mg  1 tablet Oral Q breakfast Milinda Antis, PA-C   1,250 mg at 04/30/23 0802   diclofenac Sodium (VOLTAREN)  1 % topical gel 2 g  2 g Topical QID Lovorn, Megan, MD   2 g at 04/30/23 1133   diphenhydrAMINE (BENADRYL) capsule 25 mg  25 mg Oral Q6H PRN Milinda Antis, PA-C       enoxaparin (LOVENOX) injection 40 mg  40 mg Subcutaneous Q24H Milinda Antis, PA-C   40 mg at 04/29/23 2053   gabapentin (NEURONTIN) capsule 100 mg  100 mg Oral BID Lovorn, Aundra Millet, MD   100 mg at 04/30/23 0802   guaiFENesin-dextromethorphan (ROBITUSSIN DM) 100-10 MG/5ML syrup 10 mL  10 mL Oral Q6H PRN Milinda Antis, PA-C   10 mL at 04/25/23 2155   lactase (LACTAID) tablet 3,000 Units  3,000 Units Oral QAC breakfast Milinda Antis, PA-C   3,000 Units at 04/30/23 6578   levothyroxine (SYNTHROID) tablet 100 mcg  100 mcg Oral QAC breakfast Milinda Antis, PA-C   100 mcg at 04/30/23 0604   lidocaine (XYLOCAINE) 2 % jelly 1 Application  1 Application Urethral PRN Milinda Antis, PA-C       megestrol (MEGACE) 400 MG/10ML suspension 800 mg  800 mg Oral Daily Lovorn, Megan, MD       melatonin tablet 5 mg  5 mg Oral QHS Milinda Antis, PA-C   5 mg at 04/29/23 2048   methocarbamol (ROBAXIN) tablet 500 mg  500 mg Oral Q6H PRN Milinda Antis, PA-C   500 mg at 04/28/23 2205   midodrine (PROAMATINE) tablet 5 mg  5 mg Oral TID WC Lovorn, Megan, MD   5 mg at 04/30/23 0802   multivitamin with minerals tablet 1 tablet  1  tablet Oral Daily Milinda Antis, PA-C   1 tablet at 04/30/23 0802   ondansetron (ZOFRAN) tablet 4 mg  4 mg Oral Q6H PRN Milinda Antis, PA-C   4 mg at 04/29/23 1752   Or   ondansetron (ZOFRAN) injection 4 mg  4 mg Intravenous Q6H PRN Milinda Antis, PA-C   4 mg at 04/29/23 0741   pantoprazole (PROTONIX) EC tablet 40 mg  40 mg Oral Daily Milinda Antis, PA-C   40 mg at 04/30/23 0802   polyethylene glycol (MIRALAX / GLYCOLAX) packet 17 g  17 g Oral Daily PRN Milinda Antis, PA-C   17 g at 04/25/23 2158   psyllium (HYDROCIL/METAMUCIL) 1 packet  1 packet Oral Daily Milinda Antis, PA-C   1 packet at 04/30/23 0801   rosuvastatin (CRESTOR) tablet 5 mg  5 mg Oral Daily Milinda Antis, PA-C   5 mg at 04/30/23 4696   senna-docusate (Senokot-S) tablet 1 tablet  1 tablet Oral QHS Fanny Dance, MD   1 tablet at 04/29/23 2048   sorbitol 70 % solution 30 mL  30 mL Oral Daily PRN Milinda Antis, PA-C       sorbitol 70 % solution 45 mL  45 mL Oral Once Lovorn, Aundra Millet, MD       traMADol Janean Sark) tablet 50 mg  50 mg Oral Q12H PRN Lovorn, Aundra Millet, MD        Allergies as of 04/22/2023 - Reviewed 04/22/2023  Allergen Reaction Noted   Augmentin [amoxicillin-pot clavulanate] Itching 08/11/2021   Ampicillin Rash 04/13/2023     Review of Systems:    Constitutional: No weight loss, fever or chills HEENT: Eyes: No change in vision               Ears, Nose, Throat:  No change in  hearing or congestion Skin: No rash  Cardiovascular: No chest pain Respiratory: No SOB Gastrointestinal: See HPI and otherwise negative Genitourinary: No dysuria  Neurological: No headache, dizziness or syncope Musculoskeletal: No new muscle or joint pain Hematologic: No bleeding  Psychiatric: No history of depression or anxiety    Physical Exam:  Vital signs in last 24 hours: Temp:  [97.8 F (36.6 C)-98.2 F (36.8 C)] 97.8 F (36.6 C) (10/24 0353) Pulse Rate:  [74-80] 79 (10/24 0353) Resp:  [17-18] 18 (10/24  0353) BP: (106-126)/(56-68) 106/58 (10/24 0353) SpO2:  [93 %-98 %] 93 % (10/24 0353) Last BM Date : 04/28/23 General:   Pleasant Elderly Caucasian female appears to be in NAD, Well developed, Well nourished, alert and cooperative Head:  Normocephalic and atraumatic. Eyes:   PEERL, EOMI. No icterus. Conjunctiva pink. Ears:  Normal auditory acuity. Neck:  Supple Throat: Oral cavity and pharynx without inflammation, swelling or lesion. Teeth in good condition. Lungs: Respirations even and unlabored. Lungs clear to auscultation bilaterally.   No wheezes, crackles, or rhonchi.  Heart: Normal S1, S2. No MRG. Regular rate and rhythm. No peripheral edema, cyanosis or pallor.  Abdomen:  Soft, nondistended, nontender. No rebound or guarding. Normal bowel sounds. No appreciable masses or hepatomegaly. Rectal:  Not performed.  Msk:  Symmetrical without gross deformities. Peripheral pulses intact.  Extremities:  Without edema, no deformity or joint abnormality. Normal ROM, normal sensation. Neurologic:  Alert and  oriented x4;  grossly normal neurologically.  Skin:   Dry and intact without significant lesions or rashes. Psychiatric: Demonstrates good judgement and reason without abnormal affect or behaviors.  LAB RESULTS: No results for input(s): "WBC", "HGB", "HCT", "PLT" in the last 72 hours. BMET Recent Labs    04/28/23 0744  NA 138  K 4.5  CL 100  CO2 25  GLUCOSE 179*  BUN 25*  CREATININE 1.27*  CALCIUM 9.4   LFT No results for input(s): "PROT", "ALBUMIN", "AST", "ALT", "ALKPHOS", "BILITOT", "BILIDIR", "IBILI" in the last 72 hours. PT/INR No results for input(s): "LABPROT", "INR" in the last 72 hours.  STUDIES: DG Swallowing Func-Speech Pathology  Result Date: 04/29/2023 Table formatting from the original result was not included. Modified Barium Swallow Study Patient Details Name: Shae-Lynn Chila MRN: [id number] Date of Birth: March 05, 1956 Today's Date: 04/29/2023 HPI/PMH: HPI: Pt is a  67 year old female presents with the chief complaint of cervical spinal cord injury after a fall, admitted with cervical spinal cord injury central vs hemicord pattern; Acute Fracture of the right superior and inferior pubic rami- WBAT    PMH significant for Arthritis, Blood dyscrasia, Diabetes mellitus without complication (HCC), Diverticulosis, Hypothyroidism, Neuropathy, and Renal insufficiency. Per patient interview, patient underwent thyroid surgery in January 2024 and saw outpatient GI in September of 2024 with recommendations for further esophageal workup. Patient reports globus sensation with all solids at the level of the upper sternum. Recommended MBS to determine esophageal vs. oropharyngeal swallowing involvement. Clinical Impression: Clinical Impression: Patient presents with mild oropharyngeal dysphagia primarily impacted by upwards impact of esophageal deficits. Orally, patient underwent examination without dentures, thus oral deficits of prolonged mastication and bolus preparation were observed with regular solids. Patient at bedside reports need to "bear down" to swallow, and given esophageal retention and stasis observed at multiple levels, suspect impaired pressure system across the overall swallow has resulted in delay in lingual motion to transport bolus posteriorly in the oral cavity and reduced pharyngeal constriction. Reduced constriction results in diffuse pharyngeal residue with thin liquids  and residue in the vallecula with all solids. Solid residue spontaneously clears with additional reflexive swallows. Residue of thin liquids appeared to trickle in to the laryngeal vestibule during chewing of regular solids, though residue never touched the vocal folds and was eventually ejected with spontaneous swallow/throat clear. During esophageal screening, observed esophageal stasis/retention in the cervical esophagus and above the lower esophagel sphincter with all solids. Mid-esophagus appeared  tortuous in shape as bolus material travelled. Confounding esophageal impact on oropharyngeal swallow, patient exhibits tissue changes in the lower pharynx from thryoid surgery and placement of subsequent hardware, resulting in minimally reduced epiglottic inversion, though no penetration/aspiration observed during the swallow. Recommend continuation of Dys3/thin liquid diet. Instructed patient to intermittently throat clear/reswallow, though patient observed to complete independently of instruction during study. Administer medications whole in puree to assist with pharyngeal transit. Alerted patient to recommendations for GI and ENT to assess impact of both esophageal function and thryoid surgery sequelae on overall swallowing function and address as able. SLP will plan to follow up x1 to assess use of compensatory strategies, then will likely sign off. Patient agreeable to this plan. Factors that may increase risk of adverse event in presence of aspiration Rubye Oaks & Clearance Coots 2021): No data recorded Recommendations/Plan: Swallowing Evaluation Recommendations Swallowing Evaluation Recommendations Recommendations: PO diet PO Diet Recommendation: Dysphagia 3 (Mechanical soft); Thin liquids (Level 0) Liquid Administration via: Cup; Straw Medication Administration: Whole meds with puree Supervision: Patient able to self-feed; Intermittent supervision/cueing for swallowing strategies Swallowing strategies  : Slow rate; Clear throat intermittently Postural changes: Stay upright 30-60 min after meals; Position pt fully upright for meals Oral care recommendations: Oral care BID (2x/day) Recommended consults: Consider ENT consultation; Consider GI consultation; Consider esophageal assessment Treatment Plan Treatment Plan Treatment recommendations: Therapy as outlined in treatment plan below Follow-up recommendations: No SLP follow up Functional status assessment: Patient has had a recent decline in their functional status and  demonstrates the ability to make significant improvements in function in a reasonable and predictable amount of time. Treatment frequency: Min 1x/week Treatment duration: 1 week Interventions: Compensatory techniques; Patient/family education; Diet toleration management by SLP Recommendations Recommendations for follow up therapy are one component of a multi-disciplinary discharge planning process, led by the attending physician.  Recommendations may be updated based on patient status, additional functional criteria and insurance authorization. Assessment: Orofacial Exam: Orofacial Exam Oral Cavity: Oral Hygiene: WFL Oral Cavity - Dentition: Edentulous Orofacial Anatomy: WFL Oral Motor/Sensory Function: Generalized oral weakness Anatomy: Anatomy: -- (presence of hardware and suspected tissue changes near lower trachea/upper esophageal sphinctor secondary to thyroid procedure) Boluses Administered: Boluses Administered Boluses Administered: Thin liquids (Level 0); Puree; Solid  Oral Impairment Domain: Oral Impairment Domain Lip Closure: No labial escape Tongue control during bolus hold: Cohesive bolus between tongue to palatal seal Bolus preparation/mastication: Slow prolonged chewing/mashing with complete recollection Bolus transport/lingual motion: Delayed initiation of tongue motion (oral holding); Slow tongue motion Oral residue: Residue collection on oral structures Location of oral residue : Tongue; Palate Initiation of pharyngeal swallow : Posterior laryngeal surface of the epiglottis  Pharyngeal Impairment Domain: Pharyngeal Impairment Domain Soft palate elevation: No bolus between soft palate (SP)/pharyngeal wall (PW) Laryngeal elevation: Complete superior movement of thyroid cartilage with complete approximation of arytenoids to epiglottic petiole Anterior hyoid excursion: Complete anterior movement Epiglottic movement: Partial inversion Laryngeal vestibule closure: Incomplete, narrow column air/contrast in  laryngeal vestibule Pharyngeal stripping wave : Present - diminished Pharyngeal contraction (A/P view only): N/A Pharyngoesophageal segment opening: Partial distention/partial duration, partial  obstruction of flow Tongue base retraction: Trace column of contrast or air between tongue base and PPW Pharyngeal residue: Collection of residue within or on pharyngeal structures Location of pharyngeal residue: Diffuse (>3 areas)  Esophageal Impairment Domain: Esophageal Impairment Domain Esophageal clearance upright position: Esophageal retention Pill: No data recorded Penetration/Aspiration Scale Score: Penetration/Aspiration Scale Score 1.  Material does not enter airway: Puree; Solid 3.  Material enters airway, remains ABOVE vocal cords and not ejected out: Thin liquids (Level 0) Compensatory Strategies: Compensatory Strategies Compensatory strategies: Yes Other(comment): Effective (intermittent throat clear/reswallow) Effective Other(comment): Thin liquid (Level 0)   General Information: Caregiver present: No  Diet Prior to this Study: Thin liquids (Level 0); Dysphagia 3 (mechanical soft)   Temperature : Normal   Respiratory Status: WFL   Supplemental O2: None (Room air)   History of Recent Intubation: No  Behavior/Cognition: Alert; Cooperative Self-Feeding Abilities: Able to self-feed Baseline vocal quality/speech: Hypophonia/low volume Volitional Cough: Able to elicit Volitional Swallow: Able to elicit Exam Limitations: No limitations (limited bolus trials administered given patient's morning of nausea) Goal Planning: Prognosis for improved oropharyngeal function: Guarded No data recorded Barriers/Prognosis Comment: oropharyngeal dysphagia is impacted by suspected esophageal deficits and sequelae from thyroid procedure Patient/Family Stated Goal: did not state Consulted and agree with results and recommendations: Patient Pain: Pain Assessment Pain Assessment: Faces Faces Pain Scale: 0 End of Session: Start Time:No  data recorded Stop Time: No data recorded Time Calculation:No data recorded Charges: No data recorded SLP visit diagnosis: SLP Visit Diagnosis: Dysphagia, oropharyngeal phase (R13.12) Past Medical History: Past Medical History: Diagnosis Date  Arthritis   Blood dyscrasia   patient stated she is a "free bleeder"  Diabetes mellitus without complication (HCC)   Diverticulosis   Hypothyroidism   Neuropathy   Renal insufficiency  Past Surgical History: Past Surgical History: Procedure Laterality Date  ABDOMINAL HYSTERECTOMY    BILATERAL SALPINGECTOMY Bilateral 03/16/2017  Procedure: BILATERAL SALPINGECTOMY;  Surgeon: Schermerhorn, Ihor Austin, MD;  Location: [facility] ORS;  Service: Gynecology;  Laterality: Bilateral;  COLONOSCOPY WITH PROPOFOL N/A 08/18/2018  Procedure: COLONOSCOPY WITH PROPOFOL;  Surgeon: Scot Jun, MD;  Location: [facility] ENDOSCOPY;  Service: Endoscopy;  Laterality: N/A;  CYSTOCELE REPAIR N/A 03/16/2017  Procedure: ANTERIOR REPAIR (CYSTOCELE);  Surgeon: Schermerhorn, Ihor Austin, MD;  Location: [facility] ORS;  Service: Gynecology;  Laterality: N/A;  ESOPHAGOGASTRODUODENOSCOPY (EGD) WITH PROPOFOL N/A 08/18/2018  Procedure: ESOPHAGOGASTRODUODENOSCOPY (EGD) WITH PROPOFOL;  Surgeon: Scot Jun, MD;  Location: [facility] ENDOSCOPY;  Service: Endoscopy;  Laterality: N/A;  TEAR DUCT PROBING Right   TUBAL LIGATION    VAGINAL HYSTERECTOMY N/A 03/16/2017  Procedure: HYSTERECTOMY VAGINAL;  Surgeon: Schermerhorn, Ihor Austin, MD;  Location: [facility] ORS;  Service: Gynecology;  Laterality: N/A;  WISDOM TOOTH EXTRACTION   Jeannie Done, M.A., CCC-SLP Yetta Barre 04/29/2023, 11:53 AM     Impression / Plan:   Impression: 1.  Dysphagia and a dyne aphasia: Prior Schatzki's ring dilated in February 2020, recent fall and cervical cord injury, prior thyroidectomy, MBS with impaired oropharyngeal swallow as well as altered mucosa in the esophagus per their report, also reports painful swallowing; likely multifactorial including  Schatzki's ring +/- candidiasis 2.  Incomplete quadriplegia with functional deficits  Plan: 1.  Plan for EGD with dilation tomorrow with Dr. Adela Lank.  Did discuss risks, benefits, limitations and alternatives and the patient agrees to proceed. 2.  Patient will be on her dysphagia 3 diet today and n.p.o. at midnight 3.  Hold dose of Lovenox this evening 10/24 4.  Continue Pantoprazole 40 mg p.o. daily 5.  Ordered viscous lidocaine as needed for the patient given her throat pain  Thank you for your kind consultation, we will continue to follow.  Violet Baldy Jaiyana Canale  04/30/2023, 1:37 PM

## 2023-04-30 NOTE — Progress Notes (Addendum)
PROGRESS NOTE   Subjective/Complaints:  Pt c/o R great toe painful- never had gout.  Ate <25% of tray- said would eat banana but has no appetite- doesn't think "swallowing discomfort" interfering in swallowing- doesn't want GI called at this time.  Pt also reports LBM 2+ days ag- was small per chart- she notes hates Miralax and would rather have sorbitol-  Per OT< couldn't wake up yesterday but [facility] better today.      ROS:   Pt denies SOB, abd pain, CP, N/V/C/D, and vision changes  Except for HPI  Objective:   DG Swallowing Func-Speech Pathology  Result Date: 04/29/2023 Table formatting from the original result was not included. Modified Barium Swallow Study Patient Details Name: [name] MRN: [id number] Date of Birth: 02-May-1956 Today's Date: 04/29/2023 HPI/PMH: HPI: Pt is a 67 year old female presents with the chief complaint of cervical spinal cord injury after a fall, admitted with cervical spinal cord injury central vs hemicord pattern; Acute Fracture of the right superior and inferior pubic rami- WBAT    PMH significant for Arthritis, Blood dyscrasia, Diabetes mellitus without complication (HCC), Diverticulosis, Hypothyroidism, Neuropathy, and Renal insufficiency. Per patient interview, patient underwent thyroid surgery in January 2024 and saw outpatient GI in September of 2024 with recommendations for further esophageal workup. Patient reports globus sensation with all solids at the level of the upper sternum. Recommended MBS to determine esophageal vs. oropharyngeal swallowing involvement. Clinical Impression: Clinical Impression: Patient presents with mild oropharyngeal dysphagia primarily impacted by upwards impact of esophageal deficits. Orally, patient underwent examination without dentures, thus oral deficits of prolonged mastication and bolus preparation were observed with regular solids. Patient at bedside reports  need to "bear down" to swallow, and given esophageal retention and stasis observed at multiple levels, suspect impaired pressure system across the overall swallow has resulted in delay in lingual motion to transport bolus posteriorly in the oral cavity and reduced pharyngeal constriction. Reduced constriction results in diffuse pharyngeal residue with thin liquids and residue in the vallecula with all solids. Solid residue spontaneously clears with additional reflexive swallows. Residue of thin liquids appeared to trickle in to the laryngeal vestibule during chewing of regular solids, though residue never touched the vocal folds and was eventually ejected with spontaneous swallow/throat clear. During esophageal screening, observed esophageal stasis/retention in the cervical esophagus and above the lower esophagel sphincter with all solids. Mid-esophagus appeared tortuous in shape as bolus material travelled. Confounding esophageal impact on oropharyngeal swallow, patient exhibits tissue changes in the lower pharynx from thryoid surgery and placement of subsequent hardware, resulting in minimally reduced epiglottic inversion, though no penetration/aspiration observed during the swallow. Recommend continuation of Dys3/thin liquid diet. Instructed patient to intermittently throat clear/reswallow, though patient observed to complete independently of instruction during study. Administer medications whole in puree to assist with pharyngeal transit. Alerted patient to recommendations for GI and ENT to assess impact of both esophageal function and thryoid surgery sequelae on overall swallowing function and address as able. SLP will plan to follow up x1 to assess use of compensatory strategies, then will likely sign off. Patient agreeable to this plan. Factors that may increase risk of adverse event in presence of  aspiration [name] & [name] 2021): No data recorded Recommendations/Plan: Swallowing Evaluation Recommendations  Swallowing Evaluation Recommendations Recommendations: PO diet PO Diet Recommendation: Dysphagia 3 (Mechanical soft); Thin liquids (Level 0) Liquid Administration via: Cup; Straw Medication Administration: Whole meds with puree Supervision: Patient able to self-feed; Intermittent supervision/cueing for swallowing strategies Swallowing strategies  : Slow rate; Clear throat intermittently Postural changes: Stay upright 30-60 min after meals; Position pt fully upright for meals Oral care recommendations: Oral care BID (2x/day) Recommended consults: Consider ENT consultation; Consider GI consultation; Consider esophageal assessment Treatment Plan Treatment Plan Treatment recommendations: Therapy as outlined in treatment plan below Follow-up recommendations: No SLP follow up Functional status assessment: Patient has had a recent decline in their functional status and demonstrates the ability to make significant improvements in function in a reasonable and predictable amount of time. Treatment frequency: Min 1x/week Treatment duration: 1 week Interventions: Compensatory techniques; Patient/family education; Diet toleration management by SLP Recommendations Recommendations for follow up therapy are one component of a multi-disciplinary discharge planning process, led by the attending physician.  Recommendations may be updated based on patient status, additional functional criteria and insurance authorization. Assessment: Orofacial Exam: Orofacial Exam Oral Cavity: Oral Hygiene: WFL Oral Cavity - Dentition: Edentulous Orofacial Anatomy: WFL Oral Motor/Sensory Function: Generalized oral weakness Anatomy: Anatomy: -- (presence of hardware and suspected tissue changes near lower trachea/upper esophageal sphinctor secondary to thyroid procedure) Boluses Administered: Boluses Administered Boluses Administered: Thin liquids (Level 0); Puree; Solid  Oral Impairment Domain: Oral Impairment Domain Lip Closure: No labial escape  Tongue control during bolus hold: Cohesive bolus between tongue to palatal seal Bolus preparation/mastication: Slow prolonged chewing/mashing with complete recollection Bolus transport/lingual motion: Delayed initiation of tongue motion (oral holding); Slow tongue motion Oral residue: Residue collection on oral structures Location of oral residue : Tongue; Palate Initiation of pharyngeal swallow : Posterior laryngeal surface of the epiglottis  Pharyngeal Impairment Domain: Pharyngeal Impairment Domain Soft palate elevation: No bolus between soft palate (SP)/pharyngeal wall (PW) Laryngeal elevation: Complete superior movement of thyroid cartilage with complete approximation of arytenoids to epiglottic petiole Anterior hyoid excursion: Complete anterior movement Epiglottic movement: Partial inversion Laryngeal vestibule closure: Incomplete, narrow column air/contrast in laryngeal vestibule Pharyngeal stripping wave : Present - diminished Pharyngeal contraction (A/P view only): N/A Pharyngoesophageal segment opening: Partial distention/partial duration, partial obstruction of flow Tongue base retraction: Trace column of contrast or air between tongue base and PPW Pharyngeal residue: Collection of residue within or on pharyngeal structures Location of pharyngeal residue: Diffuse (>3 areas)  Esophageal Impairment Domain: Esophageal Impairment Domain Esophageal Cheryl upright position: Esophageal retention Pill: No data recorded Penetration/Aspiration Scale Score: Penetration/Aspiration Scale Score 1.  Material does not enter airway: Puree; Solid 3.  Material enters airway, remains ABOVE vocal cords and not ejected out: Thin liquids (Level 0) Compensatory Strategies: Compensatory Strategies Compensatory strategies: Yes Other(comment): Effective (intermittent throat clear/reswallow) Effective Other(comment): Thin liquid (Level 0)   General Information: Caregiver present: No  Diet Prior to this Study: Thin liquids (Level  0); Dysphagia 3 (mechanical soft)   Temperature : Normal   Respiratory Status: WFL   Supplemental O2: None (Room air)   History of Recent Intubation: No  Behavior/Cognition: Alert; Cooperative Self-Feeding Abilities: Able to self-feed Baseline vocal quality/speech: Hypophonia/low volume Volitional Cough: Able to elicit Volitional Swallow: Able to elicit Exam Limitations: No limitations (limited bolus trials administered given patient's morning of nausea) Goal Planning: Prognosis for improved oropharyngeal function: Guarded No data recorded Barriers/Prognosis Comment: oropharyngeal dysphagia is impacted by suspected esophageal deficits  and sequelae from thyroid procedure Patient/Family Stated Goal: did not state Consulted and agree with results and recommendations: Patient Pain: Pain Assessment Pain Assessment: Faces Faces Pain Scale: 0 End of Session: Start Time:No data recorded Stop Time: No data recorded Time Calculation:No data recorded Charges: No data recorded SLP visit diagnosis: SLP Visit Diagnosis: Dysphagia, oropharyngeal phase (R13.12) Past Medical History: Past Medical History: Diagnosis Date  Arthritis   Blood dyscrasia   patient stated she is a "free bleeder"  Diabetes mellitus without complication (HCC)   Diverticulosis   Hypothyroidism   Neuropathy   Renal insufficiency  Past Surgical History: Past Surgical History: Procedure Laterality Date  ABDOMINAL HYSTERECTOMY    BILATERAL SALPINGECTOMY Bilateral 03/16/2017  Procedure: BILATERAL SALPINGECTOMY;  Surgeon: Schermerhorn, Ihor Austin, MD;  Location: [facility] ORS;  Service: Gynecology;  Laterality: Bilateral;  COLONOSCOPY WITH PROPOFOL N/A 08/18/2018  Procedure: COLONOSCOPY WITH PROPOFOL;  Surgeon: Scot Jun, MD;  Location: [facility] ENDOSCOPY;  Service: Endoscopy;  Laterality: N/A;  CYSTOCELE REPAIR N/A 03/16/2017  Procedure: ANTERIOR REPAIR (CYSTOCELE);  Surgeon: Schermerhorn, Ihor Austin, MD;  Location: [facility] ORS;  Service: Gynecology;  Laterality: N/A;   ESOPHAGOGASTRODUODENOSCOPY (EGD) WITH PROPOFOL N/A 08/18/2018  Procedure: ESOPHAGOGASTRODUODENOSCOPY (EGD) WITH PROPOFOL;  Surgeon: Scot Jun, MD;  Location: [facility] ENDOSCOPY;  Service: Endoscopy;  Laterality: N/A;  TEAR DUCT PROBING Right   TUBAL LIGATION    VAGINAL HYSTERECTOMY N/A 03/16/2017  Procedure: HYSTERECTOMY VAGINAL;  Surgeon: Schermerhorn, Ihor Austin, MD;  Location: [facility] ORS;  Service: Gynecology;  Laterality: N/A;  WISDOM TOOTH EXTRACTION   Jeannie Done, M.A., CCC-SLP Yetta Barre 04/29/2023, 11:53 AM  No results for input(s): "WBC", "HGB", "HCT", "PLT" in the last 72 hours.   Recent Labs    04/28/23 0744  NA 138  K 4.5  CL 100  CO2 25  GLUCOSE 179*  BUN 25*  CREATININE 1.27*  CALCIUM 9.4    Intake/Output Summary (Last 24 hours) at 04/30/2023 0839 Last data filed at 04/30/2023 0357 Gross per 24 hour  Intake 318 ml  Output 175 ml  Net 143 ml        Physical Exam: Vital Signs Blood pressure (!) 106/58, pulse 79, temperature 97.8 F (36.6 C), temperature source Oral, resp. rate 18, height 5\' 7"  (1.702 m), weight 58.1 kg, SpO2 93%.         General: awake, alert, appropriate,sitting up in bed working with PT; NAD HENT: conjugate gaze; oropharynx moist CV: regular rate and rhythm; no JVD Pulmonary: CTA B/L; no W/R/R- good air movement- but had upper airway sounds- and coarse cough x1 GI: soft, NT, ND, but hypoactive BS Psychiatric: appropriate- flat, but was more interactive Neurological: alert- much more awake, but see some  tangential thinking - MAS of 1- but less than yesterday in Ue's with hoffman's (+) B/L- however more ROM of arms B/L  Moving all 4 extremities in bed Skin: Protective dressing on heels been removed, no open wounds noted  Prior exam Neuro: Alert and oriented x 4, follows commands, cranial nerves II through XII intact, voice is a little hoarse, memory intact, fair insight Sensation intact to light touch in all 4 extremities however  decreased in stocking glove type distribution below her knees-she reports this is chronic RUE: 4-/5 Deltoid, 4/5 Biceps, 4/5 Triceps, 4/5 Wrist Ext, 4/5 Grip, intrinsic hand muscles/fifth digit abduction 1 out of 5 LUE: 4/5 Deltoid, 4/5 Biceps, 4/5 Triceps, 4/5 Wrist Ext, 4/5 Grip, intrinsic hand muscles/fifth digit abduction 3 out of 5 RLE: HF 2/5,  KE 3/5, ADF 1/5, APF 2-3/5 LLE: HF 4/5, KE 4/5,  ADF 4/5, APF 4/5 No ankle clonus Musculoskeletal:  Decreased C-spine ROM Tender over right greater trochanter, pelvis Pain with ROM of right lower extremity  Assessment/Plan: 1. Functional deficits which require 3+ hours per day of interdisciplinary therapy in a comprehensive inpatient rehab setting. Physiatrist is providing close team supervision and 24 hour management of active medical problems listed below. Physiatrist and rehab team continue to assess barriers to discharge/monitor patient progress toward functional and medical goals  Care Tool:  Bathing    Body parts bathed by patient: Right arm, Left arm, Chest, Abdomen, Front perineal area, Buttocks, Right upper leg, Left upper leg, Face   Body parts bathed by helper: Right lower leg, Left lower leg     Bathing assist Assist Level: Minimal Assistance - Patient > 75%     Upper Body Dressing/Undressing Upper body dressing   What is the patient wearing?: Pull over shirt    Upper body assist Assist Level: Supervision/Verbal cueing    Lower Body Dressing/Undressing Lower body dressing      What is the patient wearing?: Incontinence brief, Pants     Lower body assist Assist for lower body dressing: Moderate Assistance - Patient 50 - 74%     Toileting Toileting    Toileting assist Assist for toileting: Minimal Assistance - Patient > 75%     Transfers Chair/bed transfer  Transfers assist     Chair/bed transfer assist level: Minimal Assistance - Patient > 75%     Locomotion Ambulation   Ambulation assist    Ambulation activity did not occur: Safety/medical concerns  Assist level: Minimal Assistance - Patient > 75% Assistive device: Walker-rolling Max distance: 25   Walk 10 feet activity   Assist  Walk 10 feet activity did not occur: Safety/medical concerns  Assist level: Minimal Assistance - Patient > 75% Assistive device: Walker-rolling   Walk 50 feet activity   Assist Walk 50 feet with 2 turns activity did not occur: Safety/medical concerns         Walk 150 feet activity   Assist Walk 150 feet activity did not occur: Safety/medical concerns         Walk 10 feet on uneven surface  activity   Assist Walk 10 feet on uneven surfaces activity did not occur: Safety/medical concerns         Wheelchair     Assist Is the patient using a wheelchair?: Yes Type of Wheelchair: Manual Wheelchair activity did not occur: Safety/medical concerns         Wheelchair 50 feet with 2 turns activity    Assist    Wheelchair 50 feet with 2 turns activity did not occur: Safety/medical concerns       Wheelchair 150 feet activity     Assist  Wheelchair 150 feet activity did not occur: Safety/medical concerns       Blood pressure (!) 106/58, pulse 79, temperature 97.8 F (36.6 C), temperature source Oral, resp. rate 18, height 5\' 7"  (1.702 m), weight 58.1 kg, SpO2 93%.  Medical Problem List and Plan: 1. Functional deficits secondary to incomplete quadriplegia- ASIA D after fall with history of longstanding cervical stenosis             -patient may  shower             -ELOS/Goals: 12 to 16 days, PT/OT supervision to mod I, SLP N/A  D/c 11/1  Con't CIR PT and OT    Limited by quadriparesis- R foot pain; sedation which is improving and poor appetite.   - has seen GI in past- will have SLP do MBSS to rule out that as cause for swallowing issues- GI suggested barium swallow and endoscopy prior to admit.    2.  Antithrombotics: -DVT/anticoagulation:   Pharmaceutical: Lovenox             -antiplatelet therapy: none   3. Pain Management: Tylenol, Percocet as needed             -continue gabapentin 200 mg twice daily   10/17- no complaints of pain- con't regimen   10/19-reports pain in her right arm, right hip and right foot.  Overall under control with current medications   10/20 reports pain is overall under control, continue current regimen  10/23- is taking Oxy which can contribute to sedation- last dose 8pm night prior- changed Oxy to Tramadol 50 mg q12 hours prn  10/24- Will add Voltaren gel 2 G QID for R toe pain- doesn't look like gout on exam, however if gets worse, will change to steroids.  4. Mood/Behavior/Sleep: LCSW to evaluate and provide emotional support             -antipsychotic agents: n/a   5. Neuropsych/cognition: This patient is capable of making decisions on her own behalf.   6. Skin/Wound Care: Routine skin care checks  10/24- Nonblancahable on R heel- stage I- will make sure wearing PRAFO and d/w nursing- will get foam as well for R heel   7. Fluids/Electrolytes/Nutrition: Routine Is and Os and follow-up chemistries             --D3 diet, thin liquids             -lactose intolerant: continue Lactaid   8: Hypothyroidism with history of multinodular goiter s/p total thyroidectomy: continue Synthroid.  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  Hemoglobin A1c was 10.3 in 2014             -per DC: "Patient may not need any oral meds @ discharge home along with her Mounjaro."    10/17- pt's CBGs 115-173 in last 24 hours- will monitor  10/18- overall controlled- con't regimen  -10/ 20- 10/22 CBGs well-controlled, continue to monitor  10/23- Cbgs running well- con't regimen CBG (last 3)  Recent Labs    04/29/23 1650 04/29/23 2035 04/30/23 0552  GLUCAP 135* 192* 132*    11: Orthostatic  hypotension: place TED hose and abd binder.  Improved after fluids given   10/17- will probably be an issue today- not drinking well  10/18-BP running 90's to 100s systolic- if has orthostasis- will need Midodrine   10/ 20 BP controlled, continue to monitor with activity  10/21- HR 107- and BP on low side- will monitor  10/22- started midodrine for [facility]- will monitor  10/23- said still had some dizziness- will con't Midodrine    04/30/2023    3:53 AM 04/29/2023    8:36 PM 04/29/2023    1:41 PM  Vitals with BMI  Systolic 106 126 161  Diastolic 58 68 56  Pulse 79 74 80    12: Normocytic anemia, chronic: follow-up CBC   13: GI prophylaxis: continue PPI   14: SCI: continue cervical collar when out of bed  or transporting               -bladder scan/check PVRs    10/17- her RLE ankle DF/PF is stronger than seen yesterday - and LLE stable at 3-/5- will wait on PRAFOs. Also doesn't appear to need [facility]  10/22- got PRAFOs per therapy- heels a little red  10/23- Pt hasn't worn PRAFOs yet- on floor in bag- will d/w Nursing 10/24- PRAFO on R and Prevalon on L  15: Right superior and inferior pubic rami fractures: Weightbearing as tolerated   16: Right arm pain: X-ray on 10/13 negative   17: Constipation vs neurogenic bowel: Give sorbitol 60 cc now>>enema if no results   18. CKD IIIA.  Recheck labs.  Avoid nephrotoxic medications   10/17-BUN up to 1.39 from 1.28 however BUN better at 28- down from 37- will push fluids- write order and recheck in AM- will also check CMP weekly. 10/9 creatinine/BUN stable at 1.24/30 10/21- Cr 1.21 and BUN 29- stable for her- pepsi drinker 10/22- About the same with BUN 25 and Cr 1.27 after IVFs- will wait on more IVFs 19. Neurogenic bowel?  10/17- LBM last night after fleets enema- but had been >5 days since LBM- will monitor and see if needs bowel program  10/ 20 add Senokot at bedtime  10/23- LBM yesterday 20. Neurogenic bladder?  10/17- having a lot of  incontinent voids- will check bladder scans to see if due to overflow and is retaining- have ordered  10/18- 2 bladder scans- 0 and 12ml- will con't for now- but her incontinence has resolved it appears, so doing better  -10/ 20 continent, bladder scans not elevated 21. Sedation  10/21- might be baclofen we started- will monitor 22. Spasticity-   10/21- started Baclofen 5 mg TID for this- might be contributing to #21- -also added [employer] PRAFO order for RLE  10/22- therapy feels issues with therapy today were due to orthostatic hypotension, not sedation from baclofen  - pt's spasticity is better with baclofen  10/23- stopped Baclofen for now since so sedated 23. Orthostatic hypotension  10/22- will start Midodrine 5 mg TID-with meals for low BP in therapy today even with TEDs- also been using ACE wraps intermittently.  24. Nausea/vomiting  10/23- pt had episode of vomiting this AM- has Zofran as needed for nausea- doesn't want right now- don't want her to have phenergan, because more sedating. 10/24- denies nausea this AM  25. Sedation- Lethargy  10/23- will stop Oxy; decrease Gabapentin to 100 mg BID; reviewed her TSH- is 0.5- added Amantadine 100 mg daily to see if will help her wake up/have more energy.   10/24- more awake this AM since reduced meds yesterday? 26. GI issues/dysphagia  10/23- per GI prior to admission, needed an endoscopy and barium swallow- if MBSS shows it, will call consult to GI  10/24- pt said doesn't think GI issues main cause of not eating- feels it's poor appetite 27. Poor appetite  10/24- will add Megace for appetite. Don't want to add Periactin or Remeron because sedating- also ate <25% of tray this AM- however said would eat banana when done with PT  I spent a total of  42   minutes on total care today- >50% coordination of care- due to  D/w nursing about R great toe and nonblanchable on R heel- with PT about PRAFO and Prevalon-- d/w pt about bowels and appetite.    Called GI per pt changing her mind- having a lot of  burning and pain/stinging with swallowing, but in distal esophagus- per her d/w SLP. Said it severely limits her intake.    LOS: 8 days A FACE TO FACE EVALUATION WAS PERFORMED  [name] 04/30/2023, 8:39 AM

## 2023-04-30 NOTE — Plan of Care (Signed)
  Problem: RH Swallowing Goal: LTG Patient will consume least restrictive diet using compensatory strategies with assistance (SLP) Description: LTG:  Patient will consume least restrictive diet using compensatory strategies with assistance (SLP) Outcome: Completed/Met   

## 2023-05-01 ENCOUNTER — Inpatient Hospital Stay (HOSPITAL_COMMUNITY): Payer: Medicare HMO | Admitting: Certified Registered"

## 2023-05-01 ENCOUNTER — Encounter (HOSPITAL_COMMUNITY)
Admission: AD | Disposition: A | Discharge status: Home Health | Payer: Self-pay | Source: Other Acute Inpatient Hospital | Attending: Physical Medicine and Rehabilitation

## 2023-05-01 ENCOUNTER — Inpatient Hospital Stay (HOSPITAL_COMMUNITY): Payer: Medicare HMO

## 2023-05-01 DIAGNOSIS — N189 Chronic kidney disease, unspecified: Secondary | ICD-10-CM | POA: Diagnosis not present

## 2023-05-01 DIAGNOSIS — K222 Esophageal obstruction: Secondary | ICD-10-CM | POA: Diagnosis not present

## 2023-05-01 DIAGNOSIS — E1122 Type 2 diabetes mellitus with diabetic chronic kidney disease: Secondary | ICD-10-CM

## 2023-05-01 HISTORY — PX: ESOPHAGOGASTRODUODENOSCOPY (EGD) WITH PROPOFOL: SHX5813

## 2023-05-01 LAB — BASIC METABOLIC PANEL
Anion gap: 7 (ref 5–15)
BUN: 25 mg/dL — ABNORMAL HIGH (ref 8–23)
CO2: 29 mmol/L (ref 22–32)
Calcium: 8.8 mg/dL — ABNORMAL LOW (ref 8.9–10.3)
Chloride: 104 mmol/L (ref 98–111)
Creatinine, Ser: 1.27 mg/dL — ABNORMAL HIGH (ref 0.44–1.00)
GFR, Estimated: 47 mL/min — ABNORMAL LOW (ref >60)
Glucose, Bld: 135 mg/dL — ABNORMAL HIGH (ref 70–99)
Potassium: 4.5 mmol/L (ref 3.5–5.1)
Sodium: 140 mmol/L (ref 135–145)

## 2023-05-01 LAB — GLUCOSE, CAPILLARY
Glucose-Capillary: 123 mg/dL — ABNORMAL HIGH (ref 70–99)
Glucose-Capillary: 151 mg/dL — ABNORMAL HIGH (ref 70–99)

## 2023-05-01 LAB — VITAMIN B12: Vitamin B-12: 931 pg/mL — ABNORMAL HIGH (ref 180–914)

## 2023-05-01 SURGERY — ESOPHAGOGASTRODUODENOSCOPY (EGD) WITH PROPOFOL
Anesthesia: Monitor Anesthesia Care | Laterality: Left

## 2023-05-01 MED ORDER — AMANTADINE HCL 50 MG/5ML PO SOLN
100.0000 mg | Freq: Every day | ORAL | Status: DC
  Administered 2023-05-02 – 2023-05-06 (×5): 100 mg via ORAL
  Filled 2023-05-01 (×5): qty 10

## 2023-05-01 MED ORDER — SODIUM CHLORIDE 0.9 % IV SOLN: INTRAVENOUS | Status: DC

## 2023-05-01 MED ORDER — PROPOFOL 500 MG/50ML IV EMUL
INTRAVENOUS | Status: DC | PRN
  Administered 2023-05-01: 100 ug/kg/min via INTRAVENOUS

## 2023-05-01 MED ORDER — ACETAMINOPHEN 160 MG/5ML PO SOLN
325.0000 mg | ORAL | Status: DC | PRN
  Administered 2023-05-05 – 2023-05-10 (×2): 650 mg via ORAL
  Filled 2023-05-01: qty 20.3

## 2023-05-01 MED ORDER — SENNOSIDES 8.8 MG/5ML PO SYRP
10.0000 mL | ORAL_SOLUTION | Freq: Every day | ORAL | Status: DC
  Administered 2023-05-01 – 2023-05-05 (×3): 10 mL via ORAL
  Filled 2023-05-01 (×4): qty 10

## 2023-05-01 MED ORDER — CALCIUM CARBONATE ANTACID 1250 MG/5ML PO SUSP
500.0000 mg | Freq: Every day | ORAL | Status: DC
  Administered 2023-05-02 – 2023-05-06 (×5): 500 mg via ORAL
  Filled 2023-05-01 (×5): qty 5

## 2023-05-01 MED ORDER — DIPHENHYDRAMINE HCL 12.5 MG/5ML PO ELIX: 25.0000 mg | ORAL_SOLUTION | Freq: Four times a day (QID) | ORAL | Status: DC | PRN

## 2023-05-01 MED ORDER — PHENYLEPHRINE 80 MCG/ML (10ML) SYRINGE FOR IV PUSH (FOR BLOOD PRESSURE SUPPORT)
PREFILLED_SYRINGE | INTRAVENOUS | Status: DC | PRN
  Administered 2023-05-01: 160 ug via INTRAVENOUS
  Administered 2023-05-01: 80 ug via INTRAVENOUS
  Administered 2023-05-01: 160 ug via INTRAVENOUS
  Administered 2023-05-01: 80 ug via INTRAVENOUS

## 2023-05-01 MED ORDER — LIDOCAINE 2% (20 MG/ML) 5 ML SYRINGE
INTRAMUSCULAR | Status: DC | PRN
  Administered 2023-05-01: 60 mg via INTRAVENOUS

## 2023-05-01 MED ORDER — DOCUSATE SODIUM 50 MG/5ML PO LIQD
100.0000 mg | Freq: Every day | ORAL | Status: DC
  Administered 2023-05-01 – 2023-05-05 (×3): 100 mg via ORAL
  Filled 2023-05-01 (×4): qty 10

## 2023-05-01 MED ORDER — GABAPENTIN 250 MG/5ML PO SOLN
100.0000 mg | Freq: Two times a day (BID) | ORAL | Status: DC
  Administered 2023-05-01 – 2023-05-06 (×9): 100 mg via ORAL
  Filled 2023-05-01 (×10): qty 2

## 2023-05-01 MED ORDER — ADULT MULTIVITAMIN LIQUID CH
15.0000 mL | Freq: Every day | ORAL | Status: DC
  Administered 2023-05-01 – 2023-05-06 (×6): 15 mL via ORAL
  Filled 2023-05-01 (×6): qty 15

## 2023-05-01 MED ORDER — PROPOFOL 10 MG/ML IV BOLUS
INTRAVENOUS | Status: DC | PRN
  Administered 2023-05-01: 50 mg via INTRAVENOUS

## 2023-05-01 SURGICAL SUPPLY — 15 items

## 2023-05-01 NOTE — Progress Notes (Signed)
Physical Therapy Session Note  Patient Details  Name: [name] MRN: [id number] Date of Birth: 10-13-1955  Today's Date: 05/01/2023 PT Individual Time: 1400-1500 PT Individual Time Calculation (min): 60 min   Short Term Goals: Week 2:  PT Short Term Goal 1 (Week 2): pt will ambulate >50 ft with LRAD PT Short Term Goal 2 (Week 2): pt will tolerate sitting OOB >4 hours PT Short Term Goal 3 (Week 2): Pt will tolerate standing > 2 min without changes in vital signs.  Skilled Therapeutic Interventions/Progress Updates:    pt received in bed and agreeable to therapy. Pt reports unrated hip pain with mobility, premedicated. Rest and positioning provided as needed.   Pt states concern over esophogeal issues and worry it would extend her stay. Provided active listening and psychosocial support as appropriate.   Donned ace wraps and binder for BP support. Bed mobility with min a for trunk elevation and very slow speed. Sit to stand with RW and ambulatory transfer to bathroom with CGA and RW, increased time. Continent B+B void, documented in flowsheet. Pt ambulated to sink and performed hand hygiene, and then performed other grooming tasks at w/c level with supervision.   Pt then ambulated to recliner in same manner. Remained in recliner at end of session, was left with all needs in reach and alarm active.   Therapy Documentation Precautions:  Precautions Precautions: Cervical, Fall Precaution Comments: verbally reviewed brace wear Required Braces or Orthoses: Cervical Brace Cervical Brace: Hard collar Restrictions Weight Bearing Restrictions: Yes RLE Weight Bearing: Weight bearing as tolerated LLE Weight Bearing: Weight bearing as tolerated Other Position/Activity Restrictions: per orders, ok to donn collar edge of bed, amb to bathroom/shower without c collar General: PT Amount of Missed Time (min): 60 Minutes PT Missed Treatment Reason: Out of hospital  appointment     Therapy/Group: Individual Therapy  Juluis Rainier 05/01/2023, 2:25 PM

## 2023-05-01 NOTE — Interval H&P Note (Signed)
History and Physical Interval Note: Patient here for EGD with dilation for dysphagia, also with odynophagia. Have discussed risks / benefits of the exam and anesthesia with her - risks include bleeding, chest pain, sore throat, perforation, etc, she understands and agrees. No interval changes since we have last seen her and she wants to proceed.  05/01/2023 9:16 AM  [name]  has presented today for surgery, with the diagnosis of Dysphagia.  The various methods of treatment have been discussed with the patient and family. After consideration of risks, benefits and other options for treatment, the patient has consented to  Procedure(s): ESOPHAGOGASTRODUODENOSCOPY (EGD) WITH PROPOFOL (Left) BALLOON DILATION (N/A) as a surgical intervention.  The patient's history has been reviewed, patient examined, no change in status, stable for surgery.  I have reviewed the patient's chart and labs.  Questions were answered to the patient's satisfaction.     Viviann Spare P Lenka Zhao

## 2023-05-01 NOTE — Progress Notes (Signed)
Physical Therapy Note  Patient Details  Name: Shavaughn Wentworth MRN: [id number] Date of Birth: 28-Jan-1956 Today's Date: 05/01/2023    Attempted to see pt at scheduled time, but pt was off unit for testing. Pt missed x 60 min of scheduled therapy, will attempt to follow up as able.    Juluis Rainier 05/01/2023, 11:19 AM

## 2023-05-01 NOTE — Op Note (Signed)
[facility] Patient Name: [name] Procedure Date : 05/01/2023 MRN: [id number] Attending MD: Willaim Rayas. Adela Lank , MD, [id number] Date of Birth: [birth year] CSN: [id number] Age: 67 Admit Type: Inpatient Procedure:                Upper GI endoscopy Indications:              Dysphagia, Odynophagia - longstanding Providers:                Viviann Spare P. Adela Lank, MD, Jacquelyn "Jaci" Clelia Croft,                            RN, Priscella Mann, Technician Referring MD:              Medicines:                Monitored Anesthesia Care Complications:            No immediate complications. Estimated blood loss:                            None. Estimated Blood Loss:     Estimated blood loss: none. Procedure:                Pre-Anesthesia Assessment:                           - Prior to the procedure, a History and Physical                            was performed, and patient medications and                            allergies were reviewed. The patient's tolerance of                            previous anesthesia was also reviewed. The risks                            and benefits of the procedure and the sedation                            options and risks were discussed with the patient.                            All questions were answered, and informed consent                            was obtained. Prior Anticoagulants: The patient has                            taken no anticoagulant or antiplatelet agents. ASA                            Grade Assessment: III - A patient with severe  systemic disease. After reviewing the risks and                            benefits, the patient was deemed in satisfactory                            condition to undergo the procedure.                           After obtaining informed consent, the endoscope was                            passed under direct vision. Throughout the                             procedure, the patient's blood pressure, pulse, and                            oxygen saturations were monitored continuously. The                            [id number]-H190 ([id number]) Olympus endoscope was introduced                            through the mouth, with the intention of advancing                            to the duodenum. The scope was advanced to the                            lower third of the esophagus before the procedure                            was aborted. Medications were given. The upper GI                            endoscopy was accomplished without difficulty. The                            patient tolerated the procedure well. Scope In: Scope Out: Findings:      One benign-appearing, intrinsic severe (stenosis; an endoscope cannot       pass) stenosis was found 38 cm from the incisors. The stenosis was not       traversed with the regular upper endoscope (diameter 9mm). This was       removed and replaced with pediatric stricture scope (5mm). This looped       quite a bit in the esophagus but unfortunately also could not traverse       this - there appears to be an angulated turn distal to the stricture. A       pediatric forceps was used to stiffen the pediatric EGD scope. The       forceps was probed through the stricture gently and appeared to be able       to traverse it.  A large diverticulum with a opening was found in the lower third of the       esophagus at 34cm from the incisors.      There was significant changes of stasis with some retained food that was       removed. The exam of the esophagus was otherwise normal. Impression:               - Benign-appearing severe esophageal stenosis as                            outlined which could not be traversed with the                            regular or pediatric endoscope. Stricture is severe                            only 2-38mm in diameter suspected.                           - Diverticulum in the  lower third of the esophagus.                           - Changes of stasis.                           Overall suspect this has been ongoing for some                            time, diverticulum has developed proximal to the                            stricture. This will be difficult to treat.                            Recommend barium swallow to assess length of the                            stricture, exclude any distal mass, etc, and then                            can discuss possible Savary dilation. Recommendation:           - Return patient to hospital ward for ongoing care.                           - Clear liquid diet only - no solid food                           - Continue present medications.                           - Barium swallow (without tablet)                           - Will follow, further recommendations pending  findings. Procedure Code(s):        --- Professional ---                           [phone], 52, Esophagogastroduodenoscopy, flexible,                            transoral; diagnostic, including collection of                            specimen(s) by brushing or washing, when performed                            (separate procedure) Diagnosis Code(s):        --- Professional ---                           K22.2, Esophageal obstruction                           Q39.6, Congenital diverticulum of esophagus                           R13.10, Dysphagia, unspecified CPT copyright 2022 [employer]. All rights reserved. The codes documented in this report are preliminary and upon coder review may  be revised to meet current compliance requirements. Viviann Spare P. Lonisha Bobby, MD 05/01/2023 10:16:53 AM This report has been signed electronically. Number of Addenda: 0

## 2023-05-01 NOTE — Progress Notes (Signed)
PROGRESS NOTE   Subjective/Complaints:  Pt reports much more awake- NPO for endoscopy- feels urge to have BM, LBM a few days ago- constipation is chronic per pt.  Said SCI bowel issues are due to chronic issues as well.     ROS:   Pt denies SOB, abd pain, CP, N/V/C/D, and vision changes  Except for HPI  Objective:   DG ESOPHAGUS W SINGLE CM (SOL OR THIN BA)  Result Date: 05/01/2023 CLINICAL DATA:  Dysphagia and odynophagia with concern for esophageal stricture. EXAM: ESOPHAGUS/BARIUM SWALLOW/TABLET STUDY TECHNIQUE: Single contrast examination was performed using thin liquid barium. This exam was performed by Corrin Parker, PA-C, and was supervised and interpreted by Dr. Genevive Bi. FLUOROSCOPY: Radiation Exposure Index (as provided by the fluoroscopic device): 9.9 mGy Kerma COMPARISON:  CT scan of the abdomen and pelvis done April 13, 2023 FINDINGS: Swallowing: Appears normal. No vestibular penetration or aspiration seen. Esophagus: Patulous esophagus with stricture seen at the distal esophagus. There is abrupt narrowing in the distal esophagus just above the GE junction. This stricturing involves a 1.5 cm segment. Proximal to this persistent stricturing there is a outpouching which could represent a diverticulum. Esophageal motility: Tertiary contractions in the mid to distal esophagus. Hiatal Hernia: No hiatal hernia seen. Gastroesophageal reflux: No spontaneous gastroesophageal reflux seen. Ingested 13mm barium tablet: Not given due to stricture. Other: None. IMPRESSION: 1. Distal esophageal stricture (1.5 cm) position just above the GE junction. 2. 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 Electronically Signed   By: Genevive Bi M.D.   On: 05/01/2023 15:31   No results for input(s): "WBC", "HGB", "HCT", "PLT" in the last 72 hours.   Recent Labs    05/01/23 0637  NA 140  K 4.5  CL 104   CO2 29  GLUCOSE 135*  BUN 25*  CREATININE 1.27*  CALCIUM 8.8*    Intake/Output Summary (Last 24 hours) at 05/01/2023 1924 Last data filed at 05/01/2023 1824 Gross per 24 hour  Intake 460 ml  Output 430 ml  Net 30 ml        Physical Exam: Vital Signs Blood pressure (!) 111/50, pulse 95, temperature 98.1 F (36.7 C), resp. rate 18, height 5\' 7"  (1.702 m), weight 58.1 kg, SpO2 97%.          General: awake, alert, appropriate, sitting up in bed- laughing with therapy; NAD HENT: conjugate gaze; oropharynx moist CV: regular rate and rhythm- in 90's no JVD Pulmonary: CTA B/L; no W/R/R- good air movement GI: soft, NT, ND, (+)BS- slightly hypoactive Psychiatric: appropriate Neurological: Ox3 Pt the most awake, with it since been here Neurological: alert- much more awake, but see some  tangential thinking - MAS of 1- but less than yesterday in Ue's with hoffman's (+) B/L- however more ROM of arms B/L  Moving all 4 extremities in bed Skin: Protective dressing on heels been removed, no open wounds noted  Prior exam Neuro: Alert and oriented x 4, follows commands, cranial nerves II through XII intact, voice is a little hoarse, memory intact, fair insight Sensation intact to light touch in all 4 extremities however decreased in stocking glove type distribution below  her knees-she reports this is chronic RUE: 4-/5 Deltoid, 4/5 Biceps, 4/5 Triceps, 4/5 Wrist Ext, 4/5 Grip, intrinsic hand muscles/fifth digit abduction 1 out of 5 LUE: 4/5 Deltoid, 4/5 Biceps, 4/5 Triceps, 4/5 Wrist Ext, 4/5 Grip, intrinsic hand muscles/fifth digit abduction 3 out of 5 RLE: HF 2/5, KE 3/5, ADF 1/5, APF 2-3/5 LLE: HF 4/5, KE 4/5,  ADF 4/5, APF 4/5 No ankle clonus Musculoskeletal:  Decreased C-spine ROM Tender over right greater trochanter, pelvis Pain with ROM of right lower extremity  Assessment/Plan: 1. Functional deficits which require 3+ hours per day of interdisciplinary therapy in a  comprehensive inpatient rehab setting. Physiatrist is providing close team supervision and 24 hour management of active medical problems listed below. Physiatrist and rehab team continue to assess barriers to discharge/monitor patient progress toward functional and medical goals  Care Tool:  Bathing    Body parts bathed by patient: Right arm, Left arm, Chest, Abdomen, Front perineal area, Buttocks, Right upper leg, Left upper leg, Face   Body parts bathed by helper: Right lower leg, Left lower leg     Bathing assist Assist Level: Minimal Assistance - Patient > 75%     Upper Body Dressing/Undressing Upper body dressing   What is the patient wearing?: Pull over shirt    Upper body assist Assist Level: Supervision/Verbal cueing    Lower Body Dressing/Undressing Lower body dressing      What is the patient wearing?: Pants     Lower body assist Assist for lower body dressing: Minimal Assistance - Patient > 75%     Toileting Toileting    Toileting assist Assist for toileting: Minimal Assistance - Patient > 75%     Transfers Chair/bed transfer  Transfers assist     Chair/bed transfer assist level: Minimal Assistance - Patient > 75%     Locomotion Ambulation   Ambulation assist   Ambulation activity did not occur: Safety/medical concerns  Assist level: Minimal Assistance - Patient > 75% Assistive device: Walker-rolling Max distance: 25   Walk 10 feet activity   Assist  Walk 10 feet activity did not occur: Safety/medical concerns  Assist level: Minimal Assistance - Patient > 75% Assistive device: Walker-rolling   Walk 50 feet activity   Assist Walk 50 feet with 2 turns activity did not occur: Safety/medical concerns         Walk 150 feet activity   Assist Walk 150 feet activity did not occur: Safety/medical concerns         Walk 10 feet on uneven surface  activity   Assist Walk 10 feet on uneven surfaces activity did not occur:  Safety/medical concerns         Wheelchair     Assist Is the patient using a wheelchair?: Yes Type of Wheelchair: Manual Wheelchair activity did not occur: Safety/medical concerns         Wheelchair 50 feet with 2 turns activity    Assist    Wheelchair 50 feet with 2 turns activity did not occur: Safety/medical concerns       Wheelchair 150 feet activity     Assist  Wheelchair 150 feet activity did not occur: Safety/medical concerns       Blood pressure (!) 111/50, pulse 95, temperature 98.1 F (36.7 C), resp. rate 18, height 5\' 7"  (1.702 m), weight 58.1 kg, SpO2 97%.  Medical Problem List and Plan: 1. Functional deficits secondary to incomplete quadriplegia- ASIA D after fall with history of longstanding cervical stenosis             -  patient may  shower             -ELOS/Goals: 12 to 16 days, PT/OT supervision to mod I, SLP N/A           D/c 11/1  Con't CIR PT and OT  Pt has very severe esophageal stricture- barium swallow to determine if can be dilated per GI  Limited by quadriparesis- R foot pain; sedation which is improving and poor appetite.   - has seen GI in past- will have SLP do MBSS to rule out that as cause for swallowing issues- GI suggested barium swallow and endoscopy prior to admit.    2.  Antithrombotics: -DVT/anticoagulation:  Pharmaceutical: Lovenox             -antiplatelet therapy: none   3. Pain Management: Tylenol, Percocet as needed             -continue gabapentin 200 mg twice daily   10/17- no complaints of pain- con't regimen   10/19-reports pain in her right arm, right hip and right foot.  Overall under control with current medications   10/20 reports pain is overall under control, continue current regimen  10/23- is taking Oxy which can contribute to sedation- last dose 8pm night prior- changed Oxy to Tramadol 50 mg q12 hours prn  10/24- Will add Voltaren gel 2 G QID for R toe pain- doesn't look like gout on exam, however if  gets worse, will change to steroids.  4. Mood/Behavior/Sleep: LCSW to evaluate and provide emotional support             -antipsychotic agents: n/a   5. Neuropsych/cognition: This patient is capable of making decisions on her own behalf.   6. Skin/Wound Care: Routine skin care checks  10/24- Nonblancahable on R heel- stage I- will make sure wearing PRAFO and d/w nursing- will get foam as well for R heel   7. Fluids/Electrolytes/Nutrition: Routine Is and Os and follow-up chemistries             --D3 diet, thin liquids             -lactose intolerant: continue Lactaid   8: Hypothyroidism with history of multinodular goiter s/p total thyroidectomy: continue Synthroid.  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  Hemoglobin A1c was 10.3 in 2014             -per DC: "Patient may not need any oral meds @ discharge home along with her Mounjaro."    10/17- pt's CBGs 115-173 in last 24 hours- will monitor  10/18- overall controlled- con't regimen  -10/ 20- 10/22 CBGs well-controlled, continue to monitor  10/23- Cbgs running well- con't regimen CBG (last 3)  Recent Labs    04/30/23 2048 05/01/23 1151 05/01/23 1624  GLUCAP 207* 123* 151*    11: Orthostatic hypotension: place TED hose and abd binder.  Improved after fluids given   10/17- will probably be an issue today- not drinking well  10/18-BP running 90's to 100s systolic- if has orthostasis- will need Midodrine   10/ 20 BP controlled, continue to monitor with activity  10/21- HR 107- and BP on low side- will monitor  10/22- started midodrine for OH- will monitor  10/23- said still had some dizziness- will con't Midodrine  10/25- BP better with therapy- con't midodrine    05/01/2023  3:15 PM 05/01/2023   10:40 AM 05/01/2023   10:30 AM  Vitals with BMI  Systolic 111 110 161  Diastolic 50 48 47  Pulse 95 82 83     12: Normocytic anemia, chronic: follow-up CBC   13: GI prophylaxis: continue PPI   14: SCI: continue cervical collar when out of bed or transporting               -bladder scan/check PVRs    10/17- her RLE ankle DF/PF is stronger than seen yesterday - and LLE stable at 3-/5- will wait on PRAFOs. Also doesn't appear to need [facility]  10/22- got PRAFOs per therapy- heels a little red  10/23- Pt hasn't worn PRAFOs yet- on floor in bag- will d/w Nursing 10/24- PRAFO on R and Prevalon on L  15: Right superior and inferior pubic rami fractures: Weightbearing as tolerated   16: Right arm pain: X-ray on 10/13 negative   17: Constipation vs neurogenic bowel: Give sorbitol 60 cc now>>enema if no results   18. CKD IIIA.  Recheck labs.  Avoid nephrotoxic medications   10/17-BUN up to 1.39 from 1.28 however BUN better at 28- down from 37- will push fluids- write order and recheck in AM- will also check CMP weekly. 10/9 creatinine/BUN stable at 1.24/30 10/21- Cr 1.21 and BUN 29- stable for her- pepsi drinker 10/22- About the same with BUN 25 and Cr 1.27 after IVFs- will wait on more IVFs 10/25- labs Monday  19. Neurogenic bowel?  10/17- LBM last night after fleets enema- but had been >5 days since LBM- will monitor and see if needs bowel program  10/ 20 add Senokot at bedtime  10/23- LBM yesterday 20. Neurogenic bladder?  10/17- having a lot of incontinent voids- will check bladder scans to see if due to overflow and is retaining- have ordered  10/18- 2 bladder scans- 0 and 12ml- will con't for now- but her incontinence has resolved it appears, so doing better  -10/ 20 continent, bladder scans not elevated 21. Sedation  10/21- might be baclofen we started- will monitor 22. Spasticity-   10/21- started Baclofen 5 mg TID for this- might be contributing to #21- -also added [employer] PRAFO order for RLE  10/22- therapy feels issues with therapy today were due to orthostatic hypotension, not sedation  from baclofen  - pt's spasticity is better with baclofen  10/23- stopped Baclofen for now since so sedated 23. Orthostatic hypotension  10/22- will start Midodrine 5 mg TID-with meals for low BP in therapy today even with TEDs- also been using ACE wraps intermittently.   10/25- doing much better 24. Nausea/vomiting  10/23- pt had episode of vomiting this AM- has Zofran as needed for nausea- doesn't want right now- don't want her to have phenergan, because more sedating. 10/24- denies nausea this AM  25. Sedation- Lethargy  10/23- will stop Oxy; decrease Gabapentin to 100 mg BID; reviewed her TSH- is 0.5- added Amantadine 100 mg daily to see if will help her wake up/have more energy.   10/24- more awake this AM since reduced meds yesterday? 26. GI issues/dysphagia  10/23- per GI prior to admission, needed an endoscopy and barium swallow- if MBSS shows it, will call consult to GI  10/24- pt said doesn't think GI issues main cause of not eating- feels it's poor appetite  10/25- due to Severe esophageal stricture- less than 5mm- GI checking to see if can be dilated, etc- barium study ordered 27. Poor appetite  10/24- will add Megace for appetite. Don't want to add Periactin or Remeron because sedating- also ate <25% of tray this AM- however said would eat banana when done with PT  10/25- place don lqiuid diet and changed meds to liquid/crushed   I spent a total of  45  minutes on total care today- >50% coordination of care- due to  D/w nursing, SLP as well as GI about her Dysphagia due to esophageal stricture- severe-   LOS: 9 days A FACE TO FACE EVALUATION WAS PERFORMED  [name] 05/01/2023, 7:24 PM

## 2023-05-01 NOTE — Plan of Care (Signed)
  Problem: SCI BLADDER ELIMINATION Goal: RH STG MANAGE BLADDER WITH ASSISTANCE Description: STG Manage Bladder With mod I Assistance Outcome: Progressing   Problem: RH SKIN INTEGRITY Goal: RH STG SKIN FREE OF INFECTION/BREAKDOWN Description: Skin will remain intake and be free of infection/breakdown with min assist  Outcome: Progressing   Problem: RH PAIN MANAGEMENT Goal: RH STG PAIN MANAGED AT OR BELOW PT'S PAIN GOAL Description: Pain will be managed less than 4 with PRN medications min assist  Outcome: Progressing

## 2023-05-01 NOTE — Progress Notes (Signed)
Occupational Therapy Session Note  Patient Details  Name: [name] MRN: [id number] Date of Birth: February 09, 1956  Today's Date: 05/01/2023 OT Individual Time: 0700-0810 OT Individual Time Calculation (min): 70 min    Short Term Goals: Week 2:  OT Short Term Goal 1 (Week 2): STG=LTG 2/2 ELOS  Skilled Therapeutic Interventions/Progress Updates:    Pt resting in bed upon arrival. Pt scheduled for procedure at 0930. Pt NPO. Supine>sit EOB with supervision. Sit<>stand from EOB with CGA x 5 with rest breaks. BUE therex with 3# barbell and 3# bar reaching out and up 3 x 10 with rest breaks. Pt remained in bed with all needs within reach. Bed alarm activated.   Therapy Documentation Precautions:  Precautions Precautions: Cervical, Fall Precaution Comments: verbally reviewed brace wear Required Braces or Orthoses: Cervical Brace Cervical Brace: Hard collar Restrictions Weight Bearing Restrictions: Yes RLE Weight Bearing: Weight bearing as tolerated LLE Weight Bearing: Weight bearing as tolerated Other Position/Activity Restrictions: per orders, ok to donn collar edge of bed, amb to bathroom/shower without c collar Pain:  Pt reports RUE forearm with supination) pain with activity; PROM and repositioning   Therapy/Group: Individual Therapy  Rich Brave 05/01/2023, 8:13 AM

## 2023-05-01 NOTE — Plan of Care (Signed)
  Problem: Consults Goal: RH SPINAL CORD INJURY PATIENT EDUCATION Description:  See Patient Education module for education specifics.  Outcome: Progressing   Problem: SCI BOWEL ELIMINATION Goal: RH STG MANAGE BOWEL WITH ASSISTANCE Description: STG Manage Bowel with min Assistance. Outcome: Progressing Goal: RH STG SCI MANAGE BOWEL WITH MEDICATION WITH ASSISTANCE Description: STG SCI Manage bowel with medication with min assistance. Outcome: Progressing Goal: RH STG SCI MANAGE BOWEL PROGRAM W/ASSIST OR AS APPROPRIATE Description: STG SCI Manage bowel program w/ min assist or as appropriate. Outcome: Progressing   Problem: SCI BLADDER ELIMINATION Goal: RH STG MANAGE BLADDER WITH ASSISTANCE Description: STG Manage Bladder With mod I Assistance Outcome: Progressing   Problem: RH SKIN INTEGRITY Goal: RH STG SKIN FREE OF INFECTION/BREAKDOWN Description: Skin will remain intake and be free of infection/breakdown with min assist  Outcome: Progressing   Problem: RH SAFETY Goal: RH STG ADHERE TO SAFETY PRECAUTIONS W/ASSISTANCE/DEVICE Description: STG Adhere to Safety Precautions With cueing Assistance/Device. Outcome: Progressing   Problem: RH PAIN MANAGEMENT Goal: RH STG PAIN MANAGED AT OR BELOW PT'S PAIN GOAL Description: Pain will be managed less than 4 with PRN medications min assist  Outcome: Progressing   Problem: RH KNOWLEDGE DEFICIT SCI Goal: RH STG INCREASE KNOWLEDGE OF SELF CARE AFTER SCI Description: Patient/caregiver will be able to manage medications and self care from nursing education and nursing handouts independently  Outcome: Progressing

## 2023-05-01 NOTE — Anesthesia Preprocedure Evaluation (Signed)
Anesthesia Evaluation  Patient identified by MRN, date of birth, ID band Patient awake    Reviewed: Allergy & Precautions, NPO status , Patient's Chart, lab work & pertinent test results, reviewed documented beta blocker date and time   Airway Mallampati: II  TM Distance: >3 FB Neck ROM: Full    Dental no notable dental hx. (+) Chipped, Missing, Poor Dentition, Dental Advisory Given   Pulmonary    Pulmonary exam normal breath sounds clear to auscultation       Cardiovascular Normal cardiovascular exam Rhythm:Regular Rate:Normal     Neuro/Psych    GI/Hepatic   Endo/Other  diabetes, Type 2Hypothyroidism    Renal/GU CRFRenal disease     Musculoskeletal  (+) Arthritis ,    Abdominal   Peds  Hematology  (+) Blood dyscrasia   Anesthesia Other Findings EKG shows Rbbb. EF 60-65 2 y ago.  Reproductive/Obstetrics                             Anesthesia Physical Anesthesia Plan  ASA: III  Anesthesia Plan: MAC   Post-op Pain Management: Minimal or no pain anticipated   Induction: Intravenous  PONV Risk Score and Plan: 2 and Propofol infusion and Treatment may vary due to age or medical condition  Airway Management Planned: Nasal Cannula  Additional Equipment:   Intra-op Plan:   Post-operative Plan:   Informed Consent: I have reviewed the patients History and Physical, chart, labs and discussed the procedure including the risks, benefits and alternatives for the proposed anesthesia with the patient or authorized representative who has indicated his/her understanding and acceptance.       Plan Discussed with: CRNA  Anesthesia Plan Comments:         Anesthesia Quick Evaluation

## 2023-05-01 NOTE — Anesthesia Postprocedure Evaluation (Signed)
Anesthesia Post Note  Patient: [name]  Procedure(s) Performed: ESOPHAGOGASTRODUODENOSCOPY (EGD) WITH PROPOFOL (Left)     Patient location during evaluation: PACU Anesthesia Type: MAC Level of consciousness: awake and alert Pain management: pain level controlled Vital Signs Assessment: post-procedure vital signs reviewed and stable Respiratory status: spontaneous breathing, nonlabored ventilation and respiratory function stable Cardiovascular status: blood pressure returned to baseline and stable Postop Assessment: no apparent nausea or vomiting Anesthetic complications: no   No notable events documented.  Last Vitals:  Vitals:   05/01/23 1030 05/01/23 1040  BP: (!) 116/47 (!) 110/48  Pulse: 83 82  Resp: 12 16  Temp:    SpO2: 92% 94%    Last Pain:  Vitals:   05/01/23 1040  TempSrc:   PainSc: 0-No pain                 Lowella Curb

## 2023-05-01 NOTE — Transfer of Care (Signed)
Immediate Anesthesia Transfer of Care Note  Patient: [name]  Procedure(s) Performed: ESOPHAGOGASTRODUODENOSCOPY (EGD) WITH PROPOFOL (Left)  Patient Location: PACU and Endoscopy Unit  Anesthesia Type:MAC  Level of Consciousness: awake and sedated  Airway & Oxygen Therapy: Patient Spontanous Breathing  Post-op Assessment: Report given to RN and Post -op Vital signs reviewed and stable  Post vital signs: Reviewed and stable  Last Vitals:  Vitals Value Taken Time  BP    Temp    Pulse    Resp    SpO2      Last Pain:  Vitals:   05/01/23 0909  TempSrc: Tympanic  PainSc: 0-No pain      Patients Stated Pain Goal: 0 (04/28/23 2020)  Complications: No notable events documented.

## 2023-05-02 LAB — GLUCOSE, CAPILLARY
Glucose-Capillary: 108 mg/dL — ABNORMAL HIGH (ref 70–99)
Glucose-Capillary: 114 mg/dL — ABNORMAL HIGH (ref 70–99)
Glucose-Capillary: 97 mg/dL (ref 70–99)

## 2023-05-02 MED ORDER — METHOCARBAMOL 500 MG PO TABS: 500.0000 mg | ORAL_TABLET | Freq: Four times a day (QID) | ORAL | Status: DC | PRN

## 2023-05-02 MED ORDER — LOPERAMIDE HCL 1 MG/7.5ML PO SUSP
1.0000 mg | Freq: Once | ORAL | Status: AC
  Administered 2023-05-02: 1 mg via ORAL
  Filled 2023-05-02: qty 7.5

## 2023-05-02 NOTE — Progress Notes (Signed)
Progress Note   Subjective  Patient has a lot of questions about EGD yesterday.  She is tolerating clear liquid diet.   Objective   Vital signs in last 24 hours: Temp:  [98.1 F (36.7 C)-98.4 F (36.9 C)] 98.1 F (36.7 C) (10/26 0406) Pulse Rate:  [73-95] 80 (10/26 0406) Resp:  [18] 18 (10/26 0406) BP: (106-150)/(49-54) 150/49 (10/26 0406) SpO2:  [97 %-99 %] 98 % (10/26 0406) Last BM Date : 05/01/23 General:    white female in NAD Neurologic:  Alert and oriented,  grossly normal neurologically. Psych:  Cooperative. Normal mood and affect.  Intake/Output from previous day: 10/25 0701 - 10/26 0700 In: 460 [P.O.:360; I.V.:100] Out: 750 [Urine:750] Intake/Output this shift: Total I/O In: -  Out: 100 [Urine:100]  Lab Results: No results for input(s): "WBC", "HGB", "HCT", "PLT" in the last 72 hours. BMET Recent Labs    05/01/23 0637  NA 140  K 4.5  CL 104  CO2 29  GLUCOSE 135*  BUN 25*  CREATININE 1.27*  CALCIUM 8.8*   LFT No results for input(s): "PROT", "ALBUMIN", "AST", "ALT", "ALKPHOS", "BILITOT", "BILIDIR", "IBILI" in the last 72 hours. PT/INR No results for input(s): "LABPROT", "INR" in the last 72 hours.  Studies/Results: DG ESOPHAGUS W SINGLE CM (SOL OR THIN BA)  Result Date: 05/01/2023 CLINICAL DATA:  Dysphagia and odynophagia with concern for esophageal stricture. EXAM: ESOPHAGUS/BARIUM SWALLOW/TABLET STUDY TECHNIQUE: Single contrast examination was performed using thin liquid barium. This exam was performed by Corrin Parker, PA-C, and was supervised and interpreted by Dr. Genevive Bi. FLUOROSCOPY: Radiation Exposure Index (as provided by the fluoroscopic device): 9.9 mGy Kerma COMPARISON:  CT scan of the abdomen and pelvis done April 13, 2023 FINDINGS: Swallowing: Appears normal. No vestibular penetration or aspiration seen. Esophagus: Patulous esophagus with stricture seen at the distal esophagus. There is abrupt narrowing in the distal  esophagus just above the GE junction. This stricturing involves a 1.5 cm segment. Proximal to this persistent stricturing there is a outpouching which could represent a diverticulum. Esophageal motility: Tertiary contractions in the mid to distal esophagus. Hiatal Hernia: No hiatal hernia seen. Gastroesophageal reflux: No spontaneous gastroesophageal reflux seen. Ingested 13mm barium tablet: Not given due to stricture. Other: None. IMPRESSION: 1. Distal esophageal stricture (1.5 cm) position just above the GE junction. 2. 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 Electronically Signed   By: Genevive Bi M.D.   On: 05/01/2023 15:31       Assessment / Plan:    67 year old female here with the following:  Dysphagia secondary to severe esophageal stricture Odynophagia Incomplete quadriplegia  Recovering from a fall on rehab ward, has had ongoing problems with dysphagia and odynophagia.  EGD yesterday showed a severe distal esophageal stricture, markedly narrowed lumen that prohibited passage of regular endoscope (9mm diameter) and ultraslim stricture scope (5mm).  I did not attempt dilation at the procedure yesterday, wanted more information prior to treating this.  Barium swallow performed and she has a 1.5 cm in length stricture with marked luminal narrowing.  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  Otherwise no marked dilation concerning for achalasia etc.  The GEJ appeared normal on EGD about 4 years ago, with only a mild stricture at that time.  I am surprised the patient has been able to tolerate any solid food given the significant stricturing she has.  She has been doing okay with clear liquids since  the procedure.  I discussed options with her.  I think reasonable to attempt a dilation using fluoroscopy to see if we can traverse the stricture and attempt to open it endoscopically.  Options of savory dilation versus  wire-guided balloon dilation.  I discussed her case with my colleagues.  Given the diverticulum, direct visualization with balloon dilation is preferred.  I counseled her that this stricture is higher than average risk for dilation, there is risk for perforation and bleeding.  Would use smallest balloon we have and plan for a gentle mild dilation.  It is likely she will need multiple dilations over the course of a few months to open this further.  Hopefully we can dilate enough to allow passage of the stricture scope to evaluate the inferior portion of this and ensure nothing else concerning going on.  It appears benign on the barium study and again last EGD showed no evidence of Barrett's or concerning pathology to put her at risk for malignancy.  If she does not want to try EGD while she is recovering, then she would likely need a PEG tube for nutrition, stay on strict liquid diet, and attempt treatment for this at a later time.  I discussed at length with her and family her options and they agree and wish to try balloon dilation under fluoroscopy tomorrow.  I we will tentatively have the scheduled for tomorrow morning.  Please keep n.p.o. after midnight.  I discontinued her Lovenox for this evening in preparation for dilation.  PLAN: - EGD with dilation under flouroscopy tomorrow - clear liquid diet today - NPO after MN - holding lovenox today for dilation tomorrow.  Call with questions in the interim.  Harlin Rain, MD [facility]

## 2023-05-02 NOTE — H&P (View-Only) (Signed)
Progress Note   Subjective  Patient has a lot of questions about EGD yesterday.  She is tolerating clear liquid diet.   Objective   Vital signs in last 24 hours: Temp:  [98.1 F (36.7 C)-98.4 F (36.9 C)] 98.1 F (36.7 C) (10/26 0406) Pulse Rate:  [73-95] 80 (10/26 0406) Resp:  [18] 18 (10/26 0406) BP: (106-150)/(49-54) 150/49 (10/26 0406) SpO2:  [97 %-99 %] 98 % (10/26 0406) Last BM Date : 05/01/23 General:    white female in NAD Neurologic:  Alert and oriented,  grossly normal neurologically. Psych:  Cooperative. Normal mood and affect.  Intake/Output from previous day: 10/25 0701 - 10/26 0700 In: 460 [P.O.:360; I.V.:100] Out: 750 [Urine:750] Intake/Output this shift: Total I/O In: -  Out: 100 [Urine:100]  Lab Results: No results for input(s): "WBC", "HGB", "HCT", "PLT" in the last 72 hours. BMET Recent Labs    05/01/23 0637  NA 140  K 4.5  CL 104  CO2 29  GLUCOSE 135*  BUN 25*  CREATININE 1.27*  CALCIUM 8.8*   LFT No results for input(s): "PROT", "ALBUMIN", "AST", "ALT", "ALKPHOS", "BILITOT", "BILIDIR", "IBILI" in the last 72 hours. PT/INR No results for input(s): "LABPROT", "INR" in the last 72 hours.  Studies/Results: DG ESOPHAGUS W SINGLE CM (SOL OR THIN BA)  Result Date: 05/01/2023 CLINICAL DATA:  Dysphagia and odynophagia with concern for esophageal stricture. EXAM: ESOPHAGUS/BARIUM SWALLOW/TABLET STUDY TECHNIQUE: Single contrast examination was performed using thin liquid barium. This exam was performed by Corrin Parker, PA-C, and was supervised and interpreted by Dr. Genevive Bi. FLUOROSCOPY: Radiation Exposure Index (as provided by the fluoroscopic device): 9.9 mGy Kerma COMPARISON:  CT scan of the abdomen and pelvis done April 13, 2023 FINDINGS: Swallowing: Appears normal. No vestibular penetration or aspiration seen. Esophagus: Patulous esophagus with stricture seen at the distal esophagus. There is abrupt narrowing in the distal  esophagus just above the GE junction. This stricturing involves a 1.5 cm segment. Proximal to this persistent stricturing there is a outpouching which could represent a diverticulum. Esophageal motility: Tertiary contractions in the mid to distal esophagus. Hiatal Hernia: No hiatal hernia seen. Gastroesophageal reflux: No spontaneous gastroesophageal reflux seen. Ingested 13mm barium tablet: Not given due to stricture. Other: None. IMPRESSION: 1. Distal esophageal stricture (1.5 cm) position just above the GE junction. 2. 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 Electronically Signed   By: Genevive Bi M.D.   On: 05/01/2023 15:31       Assessment / Plan:    67 year old female here with the following:  Dysphagia secondary to severe esophageal stricture Odynophagia Incomplete quadriplegia  Recovering from a fall on rehab ward, has had ongoing problems with dysphagia and odynophagia.  EGD yesterday showed a severe distal esophageal stricture, markedly narrowed lumen that prohibited passage of regular endoscope (9mm diameter) and ultraslim stricture scope (5mm).  I did not attempt dilation at the procedure yesterday, wanted more information prior to treating this.  Barium swallow performed and she has a 1.5 cm in length stricture with marked luminal narrowing.  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  Otherwise no marked dilation concerning for achalasia etc.  The GEJ appeared normal on EGD about 4 years ago, with only a mild stricture at that time.  I am surprised the patient has been able to tolerate any solid food given the significant stricturing she has.  She has been doing okay with clear liquids since  the procedure.  I discussed options with her.  I think reasonable to attempt a dilation using fluoroscopy to see if we can traverse the stricture and attempt to open it endoscopically.  Options of savory dilation versus  wire-guided balloon dilation.  I discussed her case with my colleagues.  Given the diverticulum, direct visualization with balloon dilation is preferred.  I counseled her that this stricture is higher than average risk for dilation, there is risk for perforation and bleeding.  Would use smallest balloon we have and plan for a gentle mild dilation.  It is likely she will need multiple dilations over the course of a few months to open this further.  Hopefully we can dilate enough to allow passage of the stricture scope to evaluate the inferior portion of this and ensure nothing else concerning going on.  It appears benign on the barium study and again last EGD showed no evidence of Barrett's or concerning pathology to put her at risk for malignancy.  If she does not want to try EGD while she is recovering, then she would likely need a PEG tube for nutrition, stay on strict liquid diet, and attempt treatment for this at a later time.  I discussed at length with her and family her options and they agree and wish to try balloon dilation under fluoroscopy tomorrow.  I we will tentatively have the scheduled for tomorrow morning.  Please keep n.p.o. after midnight.  I discontinued her Lovenox for this evening in preparation for dilation.  PLAN: - EGD with dilation under flouroscopy tomorrow - clear liquid diet today - NPO after MN - holding lovenox today for dilation tomorrow.  Call with questions in the interim.  Harlin Rain, MD [facility]

## 2023-05-02 NOTE — Progress Notes (Signed)
PROGRESS NOTE   Subjective/Complaints: Patient is nervous about procedure tomorrow Eager to live and return home +loose stool    ROS:   Pt denies SOB, abd pain, CP, N/V/C/D, and vision changes, +loose stool  Except for HPI  Objective:   DG ESOPHAGUS W SINGLE CM (SOL OR THIN BA)  Result Date: 05/01/2023 CLINICAL DATA:  Dysphagia and odynophagia with concern for esophageal stricture. EXAM: ESOPHAGUS/BARIUM SWALLOW/TABLET STUDY TECHNIQUE: Single contrast examination was performed using thin liquid barium. This exam was performed by Corrin Parker, PA-C, and was supervised and interpreted by Dr. Genevive Bi. FLUOROSCOPY: Radiation Exposure Index (as provided by the fluoroscopic device): 9.9 mGy Kerma COMPARISON:  CT scan of the abdomen and pelvis done April 13, 2023 FINDINGS: Swallowing: Appears normal. No vestibular penetration or aspiration seen. Esophagus: Patulous esophagus with stricture seen at the distal esophagus. There is abrupt narrowing in the distal esophagus just above the GE junction. This stricturing involves a 1.5 cm segment. Proximal to this persistent stricturing there is a outpouching which could represent a diverticulum. Esophageal motility: Tertiary contractions in the mid to distal esophagus. Hiatal Hernia: No hiatal hernia seen. Gastroesophageal reflux: No spontaneous gastroesophageal reflux seen. Ingested 13mm barium tablet: Not given due to stricture. Other: None. IMPRESSION: 1. Distal esophageal stricture (1.5 cm) position just above the GE junction. 2. 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 Electronically Signed   By: Genevive Bi M.D.   On: 05/01/2023 15:31   No results for input(s): "WBC", "HGB", "HCT", "PLT" in the last 72 hours.   Recent Labs    05/01/23 0637  NA 140  K 4.5  CL 104  CO2 29  GLUCOSE 135*  BUN 25*  CREATININE 1.27*  CALCIUM 8.8*     Intake/Output Summary (Last 24 hours) at 05/02/2023 1433 Last data filed at 05/02/2023 1241 Gross per 24 hour  Intake 600 ml  Output 700 ml  Net -100 ml        Physical Exam: Vital Signs Blood pressure (!) 102/58, pulse 81, temperature 97.8 F (36.6 C), resp. rate 17, height 5\' 7"  (1.702 m), weight 58.1 kg, SpO2 98%.  General: awake, alert, appropriate, sitting up in bed- laughing with therapy; NAD HENT: conjugate gaze; oropharynx moist CV: regular rate and rhythm- in 90's no JVD Pulmonary: CTA B/L; no W/R/R- good air movement GI: soft, NT, ND, (+)BS- slightly hypoactive Psychiatric: appropriate Neurological: Ox3 Pt the most awake, with it since been here Neurological: alert- much more awake, but see some  tangential thinking - MAS of 1- but less than yesterday in Ue's with hoffman's (+) B/L- however more ROM of arms B/L  Moving all 4 extremities in bed Skin: Protective dressing on heels been removed, no open wounds noted Neuro: Alert and oriented x 4, follows commands, cranial nerves II through XII intact, voice is a little hoarse, memory intact, fair insight Sensation intact to light touch in all 4 extremities however decreased in stocking glove type distribution below her knees-she reports this is chronic RUE: 4-/5 Deltoid, 4/5 Biceps, 4/5 Triceps, 4/5 Wrist Ext, 4/5 Grip, intrinsic hand muscles/fifth digit abduction 1 out of 5 LUE: 4/5 Deltoid, 4/5 Biceps,  4/5 Triceps, 4/5 Wrist Ext, 4/5 Grip, intrinsic hand muscles/fifth digit abduction 3 out of 5 RLE: HF 2/5, KE 3/5, ADF 1/5, APF 2-3/5 LLE: HF 4/5, KE 4/5,  ADF 4/5, APF 4/5 No ankle clonus, stable 10/26 Musculoskeletal:  Decreased C-spine ROM Tender over right greater trochanter, pelvis Pain with ROM of right lower extremity  Assessment/Plan: 1. Functional deficits which require 3+ hours per day of interdisciplinary therapy in a comprehensive inpatient rehab setting. Physiatrist is providing close team supervision  and 24 hour management of active medical problems listed below. Physiatrist and rehab team continue to assess barriers to discharge/monitor patient progress toward functional and medical goals  Care Tool:  Bathing    Body parts bathed by patient: Right arm, Left arm, Chest, Abdomen, Front perineal area, Buttocks, Right upper leg, Left upper leg, Face   Body parts bathed by helper: Right lower leg, Left lower leg     Bathing assist Assist Level: Minimal Assistance - Patient > 75%     Upper Body Dressing/Undressing Upper body dressing   What is the patient wearing?: Pull over shirt    Upper body assist Assist Level: Supervision/Verbal cueing    Lower Body Dressing/Undressing Lower body dressing      What is the patient wearing?: Pants     Lower body assist Assist for lower body dressing: Minimal Assistance - Patient > 75%     Toileting Toileting    Toileting assist Assist for toileting: Minimal Assistance - Patient > 75%     Transfers Chair/bed transfer  Transfers assist     Chair/bed transfer assist level: Minimal Assistance - Patient > 75%     Locomotion Ambulation   Ambulation assist   Ambulation activity did not occur: Safety/medical concerns  Assist level: Minimal Assistance - Patient > 75% Assistive device: Walker-rolling Max distance: 25   Walk 10 feet activity   Assist  Walk 10 feet activity did not occur: Safety/medical concerns  Assist level: Minimal Assistance - Patient > 75% Assistive device: Walker-rolling   Walk 50 feet activity   Assist Walk 50 feet with 2 turns activity did not occur: Safety/medical concerns         Walk 150 feet activity   Assist Walk 150 feet activity did not occur: Safety/medical concerns         Walk 10 feet on uneven surface  activity   Assist Walk 10 feet on uneven surfaces activity did not occur: Safety/medical concerns         Wheelchair     Assist Is the patient using a  wheelchair?: Yes Type of Wheelchair: Manual Wheelchair activity did not occur: Safety/medical concerns         Wheelchair 50 feet with 2 turns activity    Assist    Wheelchair 50 feet with 2 turns activity did not occur: Safety/medical concerns       Wheelchair 150 feet activity     Assist  Wheelchair 150 feet activity did not occur: Safety/medical concerns       Blood pressure (!) 102/58, pulse 81, temperature 97.8 F (36.6 C), resp. rate 17, height 5\' 7"  (1.702 m), weight 58.1 kg, SpO2 98%.  Medical Problem List and Plan: 1. Functional deficits secondary to incomplete quadriplegia- ASIA D after fall with history of longstanding cervical stenosis             -patient may  shower             -ELOS/Goals: 12 to 16 days,  PT/OT supervision to mod I, SLP N/A           D/c 11/1  Con't CIR PT and OT  Pt has very severe esophageal stricture- barium swallow to determine if can be dilated per GI  Limited by quadriparesis- R foot pain; sedation which is improving and poor appetite.   - has seen GI in past- will have SLP do MBSS to rule out that as cause for swallowing issues- GI suggested barium swallow and endoscopy prior to admit.    2.  Antithrombotics: -DVT/anticoagulation:  Pharmaceutical: Lovenox             -antiplatelet therapy: none   3. Pain Management: Tylenol, Percocet as needed             -continue gabapentin 200 mg twice daily   10/17- no complaints of pain- con't regimen   10/19-reports pain in her right arm, right hip and right foot.  Overall under control with current medications   10/20 reports pain is overall under control, continue current regimen  10/23- is taking Oxy which can contribute to sedation- last dose 8pm night prior- changed Oxy to Tramadol 50 mg q12 hours prn  10/24- Will add Voltaren gel 2 G QID for R toe pain- doesn't look like gout on exam, however if gets worse, will change to steroids.  4. Mood/Behavior/Sleep: LCSW to evaluate and  provide emotional support             -antipsychotic agents: n/a   5. Neuropsych/cognition: This patient is capable of making decisions on her own behalf.   6. Skin/Wound Care: Routine skin care checks  10/24- Nonblancahable on R heel- stage I- will make sure wearing PRAFO and d/w nursing- will get foam as well for R heel   7. Fluids/Electrolytes/Nutrition: Routine Is and Os and follow-up chemistries             --D3 diet, thin liquids             -lactose intolerant: continue Lactaid   8: Hypothyroidism with history of multinodular goiter s/p total thyroidectomy: continue Synthroid.  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  Hemoglobin A1c was 10.3 in 2014             -per DC: "Patient may not need any oral meds @ discharge home along with her Mounjaro."    10/17- pt's CBGs 115-173 in last 24 hours- will monitor  10/18- overall controlled- con't regimen  -10/ 20- 10/22 CBGs well-controlled, continue to monitor  10/23- Cbgs running well- con't regimen CBG (last 3)  Recent Labs    05/01/23 1151 05/01/23 1624 05/02/23 1138  GLUCAP 123* 151* 108*    11: Orthostatic hypotension: place TED hose and abd binder.  Improved after fluids given   10/17- will probably be an issue today- not drinking well  10/18-BP running 90's to 100s systolic- if has orthostasis- will need Midodrine   10/ 20 BP controlled, continue to monitor with activity  10/21- HR 107- and BP on low side- will monitor  10/22- started midodrine for [facility]- will monitor  10/23- said still had some dizziness- will con't Midodrine  10/25- BP better with therapy- con't midodrine    05/02/2023    2:26 PM 05/02/2023    4:06 AM 05/01/2023    8:04 PM  Vitals with BMI  Systolic 102  150 106  Diastolic 58 49 54  Pulse 81 80 73    12: Normocytic anemia, chronic: follow-up CBC   13: GI prophylaxis: continue  PPI   14: SCI: continue cervical collar when out of bed or transporting               -bladder scan/check PVRs    10/17- her RLE ankle DF/PF is stronger than seen yesterday - and LLE stable at 3-/5- will wait on PRAFOs. Also doesn't appear to need [facility]  10/22- got PRAFOs per therapy- heels a little red  10/23- Pt hasn't worn PRAFOs yet- on floor in bag- will d/w Nursing 10/24- PRAFO on R and Prevalon on L  15: Right superior and inferior pubic rami fractures: Weightbearing as tolerated   16: Right arm pain: X-ray on 10/13 negative   17: Constipation vs neurogenic bowel: Give sorbitol 60 cc now>>enema if no results   18. CKD IIIA.  Recheck labs.  Avoid nephrotoxic medications   10/17-BUN up to 1.39 from 1.28 however BUN better at 28- down from 37- will push fluids- write order and recheck in AM- will also check CMP weekly. 10/9 creatinine/BUN stable at 1.24/30 10/21- Cr 1.21 and BUN 29- stable for her- pepsi drinker 10/22- About the same with BUN 25 and Cr 1.27 after IVFs- will wait on more IVFs 10/25- labs Monday  19. Neurogenic bowel?  10/17- LBM last night after fleets enema- but had been >5 days since LBM- will monitor and see if needs bowel program  10/ 20 add Senokot at bedtime  10/23- LBM yesterday 20. Neurogenic bladder?  10/17- having a lot of incontinent voids- will check bladder scans to see if due to overflow and is retaining- have ordered  10/18- 2 bladder scans- 0 and 12ml- will con't for now- but her incontinence has resolved it appears, so doing better  -10/ 20 continent, bladder scans not elevated 21. Sedation  10/21- might be baclofen we started- will monitor 22. Spasticity-   10/21- started Baclofen 5 mg TID for this- might be contributing to #21- -also added [employer] PRAFO order for RLE  10/22- therapy feels issues with therapy today were due to orthostatic hypotension, not sedation from baclofen  - pt's spasticity is better with baclofen  10/23- stopped Baclofen  for now since so sedated 23. Orthostatic hypotension  10/22- will start Midodrine 5 mg TID-with meals for low BP in therapy today even with TEDs- also been using ACE wraps intermittently.   10/25- doing much better 24. Nausea/vomiting  10/23- pt had episode of vomiting this AM- has Zofran as needed for nausea- doesn't want right now- don't want her to have phenergan, because more sedating. 10/24- denies nausea this AM   25. Sedation- Lethargy  10/23- will stop Oxy; decrease Gabapentin to 100 mg BID; reviewed her TSH- is 0.5- added Amantadine 100 mg daily to see if will help her wake up/have more energy.   10/26: more alert today, continue current regimen  26. GI issues/dysphagia  10/23- per GI prior to admission, needed an endoscopy and barium swallow- if MBSS shows it, will call consult to GI  10/24- pt said doesn't think GI issues main cause of not eating- feels it's poor appetite  10/25- due to Severe esophageal stricture- less than 5mm- GI checking to see if can be dilated, etc- barium study ordered  27. Poor appetite  10/24- will add Megace for appetite. Don't want to add Periactin or Remeron because sedating- also ate <25%  of tray this AM- however said would eat banana when done with PT  10/25- place don lqiuid diet and changed meds to liquid/crushed  10/26: Megace held temporarily since can not be crushed and is not in liquid form  28. Hypotension: continue midodrine  29. Loose stool: 1x imodium ordered  LOS: 10 days A FACE TO FACE EVALUATION WAS PERFORMED  Horton Chin 05/02/2023, 2:33 PM

## 2023-05-03 ENCOUNTER — Encounter (HOSPITAL_COMMUNITY): Payer: Self-pay | Admitting: Physical Medicine and Rehabilitation

## 2023-05-03 ENCOUNTER — Inpatient Hospital Stay (HOSPITAL_COMMUNITY): Payer: Medicare HMO | Admitting: Certified Registered Nurse Anesthetist

## 2023-05-03 ENCOUNTER — Encounter (HOSPITAL_COMMUNITY)
Admission: AD | Disposition: A | Discharge status: Home Health | Payer: Self-pay | Source: Other Acute Inpatient Hospital | Attending: Physical Medicine and Rehabilitation

## 2023-05-03 ENCOUNTER — Inpatient Hospital Stay (HOSPITAL_COMMUNITY): Payer: Medicare HMO

## 2023-05-03 DIAGNOSIS — E039 Hypothyroidism, unspecified: Secondary | ICD-10-CM | POA: Diagnosis not present

## 2023-05-03 DIAGNOSIS — Q396 Congenital diverticulum of esophagus: Secondary | ICD-10-CM

## 2023-05-03 DIAGNOSIS — K222 Esophageal obstruction: Secondary | ICD-10-CM | POA: Diagnosis not present

## 2023-05-03 HISTORY — PX: BIOPSY: SHX5522

## 2023-05-03 HISTORY — PX: ESOPHAGOGASTRODUODENOSCOPY (EGD) WITH PROPOFOL: SHX5813

## 2023-05-03 LAB — GLUCOSE, CAPILLARY
Glucose-Capillary: 114 mg/dL — ABNORMAL HIGH (ref 70–99)
Glucose-Capillary: 109 mg/dL — ABNORMAL HIGH (ref 70–99)
Glucose-Capillary: 122 mg/dL — ABNORMAL HIGH (ref 70–99)
Glucose-Capillary: 120 mg/dL — ABNORMAL HIGH (ref 70–99)
Glucose-Capillary: 184 mg/dL — ABNORMAL HIGH (ref 70–99)

## 2023-05-03 SURGERY — ESOPHAGOGASTRODUODENOSCOPY (EGD) WITH PROPOFOL
Anesthesia: Monitor Anesthesia Care

## 2023-05-03 MED ORDER — PHENYLEPHRINE 80 MCG/ML (10ML) SYRINGE FOR IV PUSH (FOR BLOOD PRESSURE SUPPORT)
PREFILLED_SYRINGE | INTRAVENOUS | Status: DC | PRN
  Administered 2023-05-03: 160 ug via INTRAVENOUS
  Administered 2023-05-03 (×2): 80 ug via INTRAVENOUS

## 2023-05-03 MED ORDER — PROPOFOL 10 MG/ML IV BOLUS
INTRAVENOUS | Status: DC | PRN
  Administered 2023-05-03 (×2): 20 mg via INTRAVENOUS
  Administered 2023-05-03: 50 mg via INTRAVENOUS
  Administered 2023-05-03 (×2): 30 mg via INTRAVENOUS

## 2023-05-03 MED ORDER — SODIUM CHLORIDE 0.9 % IV SOLN: INTRAVENOUS | Status: DC | PRN

## 2023-05-03 MED ORDER — LIDOCAINE HCL (CARDIAC) PF 100 MG/5ML IV SOSY
PREFILLED_SYRINGE | INTRAVENOUS | Status: DC | PRN
  Administered 2023-05-03: 50 mg via INTRAVENOUS

## 2023-05-03 MED ORDER — PROPOFOL 500 MG/50ML IV EMUL
INTRAVENOUS | Status: DC | PRN
  Administered 2023-05-03: 125 ug/kg/min via INTRAVENOUS

## 2023-05-03 SURGICAL SUPPLY — 15 items

## 2023-05-03 NOTE — Progress Notes (Signed)
PROGRESS NOTE   Subjective/Complaints: Returned from procedure drowsy from anesthesia Hypotensive but asymptomatic Continue full liquids as per GI  ROS: Denies SOB, abd pain, CP, N/V/C/D, and vision changes, +loose stool  Except for HPI  Objective:   DG C-Arm 1-60 Min  Result Date: 05/03/2023 CLINICAL DATA:  EGD and biopsy EXAM: DG C-ARM 1-60 MIN FLUOROSCOPY: Radiation Exposure Index (if provided by the fluoroscopic device): Fluoroscopy was provided, dose 4.53 mGy air kerma COMPARISON:  None Available. FINDINGS: Two images demonstrate endoscope and a opacified balloon segment along a wire, presumably at the gastroesophageal junction. Please see dedicated procedure report. IMPRESSION: 1. Fluoroscopy provided for endoscopy. Electronically Signed   By: Gaylyn Rong M.D.   On: 05/03/2023 10:18   No results for input(s): "WBC", "HGB", "HCT", "PLT" in the last 72 hours.   Recent Labs    05/01/23 0637  NA 140  K 4.5  CL 104  CO2 29  GLUCOSE 135*  BUN 25*  CREATININE 1.27*  CALCIUM 8.8*    Intake/Output Summary (Last 24 hours) at 05/03/2023 1529 Last data filed at 05/03/2023 0810 Gross per 24 hour  Intake 220 ml  Output --  Net 220 ml        Physical Exam: Vital Signs Blood pressure (!) 98/51, pulse 79, temperature 98.2 F (36.8 C), temperature source Oral, resp. rate 20, height 5\' 7"  (1.702 m), weight 58.1 kg, SpO2 95%.  General: awake, alert, appropriate, sitting up in bed- laughing with therapy; NAD HENT: conjugate gaze; oropharynx moist CV: regular rate and rhythm- in 90's no JVD Pulmonary: CTA B/L; no W/R/R- good air movement GI: soft, NT, ND, (+)BS- slightly hypoactive Psychiatric: appropriate Neurological: Ox3 Pt the most awake, with it since been here Neurological: alert- much more awake, but see some  tangential thinking - MAS of 1- but less than yesterday in Ue's with hoffman's (+) B/L- however  more ROM of arms B/L  Moving all 4 extremities in bed Skin: Protective dressing on heels been removed, no open wounds noted Neuro: Alert and oriented x 4, follows commands, cranial nerves II through XII intact, voice is a little hoarse, memory intact, fair insight Sensation intact to light touch in all 4 extremities however decreased in stocking glove type distribution below her knees-she reports this is chronic RUE: 4-/5 Deltoid, 4/5 Biceps, 4/5 Triceps, 4/5 Wrist Ext, 4/5 Grip, intrinsic hand muscles/fifth digit abduction 1 out of 5 LUE: 4/5 Deltoid, 4/5 Biceps, 4/5 Triceps, 4/5 Wrist Ext, 4/5 Grip, intrinsic hand muscles/fifth digit abduction 3 out of 5 RLE: HF 2/5, KE 3/5, ADF 1/5, APF 2-3/5 LLE: HF 4/5, KE 4/5,  ADF 4/5, APF 4/5 No ankle clonus, exam limited by sedation from anesthesia on 10/27 Musculoskeletal:  Decreased C-spine ROM Tender over right greater trochanter, pelvis Pain with ROM of right lower extremity  Assessment/Plan: 1. Functional deficits which require 3+ hours per day of interdisciplinary therapy in a comprehensive inpatient rehab setting. Physiatrist is providing close team supervision and 24 hour management of active medical problems listed below. Physiatrist and rehab team continue to assess barriers to discharge/monitor patient progress toward functional and medical goals  Care Tool:  Bathing  Body parts bathed by patient: Right arm, Left arm, Chest, Abdomen, Front perineal area, Buttocks, Right upper leg, Left upper leg, Face   Body parts bathed by helper: Right lower leg, Left lower leg     Bathing assist Assist Level: Minimal Assistance - Patient > 75%     Upper Body Dressing/Undressing Upper body dressing   What is the patient wearing?: Pull over shirt    Upper body assist Assist Level: Supervision/Verbal cueing    Lower Body Dressing/Undressing Lower body dressing      What is the patient wearing?: Pants     Lower body assist Assist for  lower body dressing: Minimal Assistance - Patient > 75%     Toileting Toileting    Toileting assist Assist for toileting: Minimal Assistance - Patient > 75%     Transfers Chair/bed transfer  Transfers assist     Chair/bed transfer assist level: Minimal Assistance - Patient > 75%     Locomotion Ambulation   Ambulation assist   Ambulation activity did not occur: Safety/medical concerns  Assist level: Minimal Assistance - Patient > 75% Assistive device: Walker-rolling Max distance: 25   Walk 10 feet activity   Assist  Walk 10 feet activity did not occur: Safety/medical concerns  Assist level: Minimal Assistance - Patient > 75% Assistive device: Walker-rolling   Walk 50 feet activity   Assist Walk 50 feet with 2 turns activity did not occur: Safety/medical concerns         Walk 150 feet activity   Assist Walk 150 feet activity did not occur: Safety/medical concerns         Walk 10 feet on uneven surface  activity   Assist Walk 10 feet on uneven surfaces activity did not occur: Safety/medical concerns         Wheelchair     Assist Is the patient using a wheelchair?: Yes Type of Wheelchair: Manual Wheelchair activity did not occur: Safety/medical concerns         Wheelchair 50 feet with 2 turns activity    Assist    Wheelchair 50 feet with 2 turns activity did not occur: Safety/medical concerns       Wheelchair 150 feet activity     Assist  Wheelchair 150 feet activity did not occur: Safety/medical concerns       Blood pressure (!) 98/51, pulse 79, temperature 98.2 F (36.8 C), temperature source Oral, resp. rate 20, height 5\' 7"  (1.702 m), weight 58.1 kg, SpO2 95%.  Medical Problem List and Plan: 1. Functional deficits secondary to incomplete quadriplegia- ASIA D after fall with history of longstanding cervical stenosis             -patient may  shower             -ELOS/Goals: 12 to 16 days, PT/OT supervision to mod  I, SLP N/A           D/c 11/1  Con't CIR PT and OT  Pt has very severe esophageal stricture- barium swallow to determine if can be dilated per GI  Limited by quadriparesis- R foot pain; sedation which is improving and poor appetite.   - has seen GI in past- will have SLP do MBSS to rule out that as cause for swallowing issues- GI suggested barium swallow and endoscopy prior to admit.    2.  Antithrombotics: -DVT/anticoagulation:  Pharmaceutical: Lovenox             -antiplatelet therapy: none   3. Pain  Management: Tylenol, Percocet as needed             -continue gabapentin 200 mg twice daily   10/17- no complaints of pain- con't regimen   10/19-reports pain in her right arm, right hip and right foot.  Overall under control with current medications   10/20 reports pain is overall under control, continue current regimen  10/23- is taking Oxy which can contribute to sedation- last dose 8pm night prior- changed Oxy to Tramadol 50 mg q12 hours prn  10/24- Will add Voltaren gel 2 G QID for R toe pain- doesn't look like gout on exam, however if gets worse, will change to steroids.  4. Mood/Behavior/Sleep: LCSW to evaluate and provide emotional support             -antipsychotic agents: n/a   5. Neuropsych/cognition: This patient is capable of making decisions on her own behalf.   6. Skin/Wound Care: Routine skin care checks  10/24- Nonblancahable on R heel- stage I- will make sure wearing PRAFO and d/w nursing- will get foam as well for R heel   7. Fluids/Electrolytes/Nutrition: Routine Is and Os and follow-up chemistries             --D3 diet, thin liquids             -lactose intolerant: continue Lactaid   8: Hypothyroidism with history of multinodular goiter s/p total thyroidectomy: continue Synthroid.  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  Hemoglobin A1c was 10.3 in  2014             -per DC: "Patient may not need any oral meds @ discharge home along with her Mounjaro."    10/17- pt's CBGs 115-173 in last 24 hours- will monitor  10/18- overall controlled- con't regimen  -10/ 20- 10/22 CBGs well-controlled, continue to monitor  10/23- Cbgs running well- con't regimen CBG (last 3)  Recent Labs    05/03/23 0544 05/03/23 0714 05/03/23 1157  GLUCAP 114* 109* 122*    11: Orthostatic hypotension: place TED hose and abd binder.  Improved after fluids given   10/17- will probably be an issue today- not drinking well  10/18-BP running 90's to 100s systolic- if has orthostasis- will need Midodrine   10/ 20 BP controlled, continue to monitor with activity  10/21- HR 107- and BP on low side- will monitor  10/22- started midodrine for OH- will monitor  10/23- said still had some dizziness- will con't Midodrine  10/25- BP better with therapy- con't midodrine    05/03/2023    2:47 PM 05/03/2023   12:03 PM 05/03/2023    9:23 AM  Vitals with BMI  Systolic 98 91 93  Diastolic 51 56 59  Pulse 79 83 70    12: Normocytic anemia, chronic: follow-up CBC   13: GI prophylaxis: continue PPI   14: SCI: continue cervical collar when out of bed or transporting               -bladder scan/check PVRs    10/17- her RLE ankle DF/PF is stronger than seen yesterday - and LLE stable at 3-/5- will wait on PRAFOs. Also doesn't appear to need [facility]  10/22- got PRAFOs per therapy- heels a little red  10/23- Pt hasn't worn PRAFOs yet- on floor in bag- will d/w Nursing 10/24- PRAFO on R and Prevalon  on L  15: Right superior and inferior pubic rami fractures: Weightbearing as tolerated   16: Right arm pain: X-ray on 10/13 negative   17: Constipation vs neurogenic bowel: Give sorbitol 60 cc now>>enema if no results   18. CKD IIIA.  Recheck labs.  Avoid nephrotoxic medications   10/17-BUN up to 1.39 from 1.28 however BUN better at 28- down from 37- will push fluids- write  order and recheck in AM- will also check CMP weekly. 10/9 creatinine/BUN stable at 1.24/30 10/21- Cr 1.21 and BUN 29- stable for her- pepsi drinker 10/22- About the same with BUN 25 and Cr 1.27 after IVFs- will wait on more IVFs 10/25- labs Monday  19. Neurogenic bowel?  10/17- LBM last night after fleets enema- but had been >5 days since LBM- will monitor and see if needs bowel program  10/ 20 add Senokot at bedtime  10/23- LBM yesterday 20. Neurogenic bladder?  10/17- having a lot of incontinent voids- will check bladder scans to see if due to overflow and is retaining- have ordered  10/18- 2 bladder scans- 0 and 12ml- will con't for now- but her incontinence has resolved it appears, so doing better  -10/ 20 continent, bladder scans not elevated 21. Sedation  10/21- might be baclofen we started- will monitor 22. Spasticity-   10/21- started Baclofen 5 mg TID for this- might be contributing to #21- -also added [employer] PRAFO order for RLE  10/22- therapy feels issues with therapy today were due to orthostatic hypotension, not sedation from baclofen  - pt's spasticity is better with baclofen  10/23- stopped Baclofen for now since so sedated 23. Orthostatic hypotension  10/22- will start Midodrine 5 mg TID-with meals for low BP in therapy today even with TEDs- also been using ACE wraps intermittently.   Continue midodrine  24. Nausea/vomiting  10/23- pt had episode of vomiting this AM- has Zofran as needed for nausea- doesn't want right now- don't want her to have phenergan, because more sedating. 10/27: appears to have resolved  25. Sedation- Lethargy  10/23- will stop Oxy; decrease Gabapentin to 100 mg BID; reviewed her TSH- is 0.5- added Amantadine 100 mg daily to see if will help her wake up/have more energy.   10/27 sedated today from anesthesia  26. GI issues/dysphagia  10/23- per GI prior to admission, needed an endoscopy and barium swallow- if MBSS shows it, will call consult to  GI  10/24- pt said doesn't think GI issues main cause of not eating- feels it's poor appetite  10/25- due to Severe esophageal stricture- less than 5mm- GI checking to see if can be dilated, etc- barium study ordered  10/27: discussed with GI and she tolerated dilation  27. Poor appetite  10/24- will add Megace for appetite. Don't want to add Periactin or Remeron because sedating- also ate <25% of tray this AM- however said would eat banana when done with PT  10/25- place don lqiuid diet and changed meds to liquid/crushed  10/26: Megace held temporarily since can not be crushed and is not in liquid form  10/27: discussed with GI and continued liquid diet recommended  28. Hypotension: continue midodrine  29. Loose stool: 1x imodium ordered  LOS: 11 days A FACE TO FACE EVALUATION WAS PERFORMED  Drema Pry Jaelyn Cloninger 05/03/2023, 3:29 PM

## 2023-05-03 NOTE — Progress Notes (Signed)
Occupational Therapy Session Note  Patient Details  Name: [name] MRN: [id number] Date of Birth: 04-04-1956  Today's Date: 05/03/2023 OT Individual Time: 1015-1110 OT Individual Time Calculation (min): 55 min    Short Term Goals: Week 2:  OT Short Term Goal 1 (Week 2): STG=LTG 2/2 ELOS  Skilled Therapeutic Interventions/Progress Updates:    Pt resting in bed upon arrival. Pt declined getting OOB this session. Pt stated she has had a "rough" couple of days. Pt had GI procedure earlier in AM. Pt still on clear liquid diet per GI MD. Pt stated she just wanted to "do some arm exercises." Bed mobility with supervision. Pt sat EOB to scoot up closer to [facility] with supervision. No reports of dizziness. BUE therex: RUE 3# and 2# bar bell circuit reaching out and up 3x10. LUE with 1# wrist weight circuit reaching out and up 3x10. Rest breaks between each set. Pt remained in bed with all needs within reach. Bed alarm activated.   Therapy Documentation Precautions:  Precautions Precautions: Cervical, Fall Precaution Comments: verbally reviewed brace wear Required Braces or Orthoses: Cervical Brace Cervical Brace: Hard collar Restrictions Weight Bearing Restrictions: Yes RLE Weight Bearing: Weight bearing as tolerated LLE Weight Bearing: Weight bearing as tolerated Other Position/Activity Restrictions: per orders, ok to donn collar edge of bed, amb to bathroom/shower without c collar Pain: Pt c/o RUE wrist discomfort when holding 2# weight; PROM/AROM WNL   Therapy/Group: Individual Therapy  Rich Brave 05/03/2023, 11:17 AM

## 2023-05-03 NOTE — Op Note (Signed)
[facility] Patient Name: [name] Procedure Date : 05/03/2023 MRN: [id number] Attending MD: Willaim Rayas. Adela Lank , MD, [id number] Date of Birth: July 10, 1955 CSN: [id number] Age: 67 Admit Type: Inpatient Procedure:                Upper GI endoscopy Indications:              Dysphagia - for therapy of severe esophageal                            stenosis noted on recent EGD and confirmed on                            barium study - very tight GEJ stricture                            approximately 1.5cm in length Providers:                Willaim Rayas. Adela Lank, MD, Jacquelyn "Jaci" Clelia Croft,                            RN, Doristine Mango, RN, Rozetta Nunnery, Technician Referring MD:              Medicines:                Monitored Anesthesia Care Complications:            No immediate complications. Estimated blood loss:                            Minimal. Estimated Blood Loss:     Estimated blood loss was minimal. Procedure:                Pre-Anesthesia Assessment:                           - Prior to the procedure, a History and Physical                            was performed, and patient medications and                            allergies were reviewed. The patient's tolerance of                            previous anesthesia was also reviewed. The risks                            and benefits of the procedure and the sedation                            options and risks were discussed with the patient.                            All questions were answered, and informed consent  was obtained. Prior Anticoagulants: The patient has                            taken no anticoagulant or antiplatelet agents. ASA                            Grade Assessment: III - A patient with severe                            systemic disease. After reviewing the risks and                            benefits, the patient was deemed in satisfactory                             condition to undergo the procedure.                           After obtaining informed consent, the endoscope was                            passed under direct vision. Throughout the                            procedure, the patient's blood pressure, pulse, and                            oxygen saturations were monitored continuously. The                            [id number]-H190 ([id number]) Olympus endoscope was introduced                            through the mouth, and advanced to the second part                            of duodenum. The upper GI endoscopy was                            accomplished without difficulty. The patient                            tolerated the procedure well. Scope In: Scope Out: Findings:      The Z-line was regular and was found 38 cm from the incisors. The distal       esophagus was quite spastic.      One benign-appearing, intrinsic stenosis was found in the distal       esophagus / GEJ. The stenosis was suprisingly able to be traversed with       the regular upper endoscope (this was not the case previously and even       stricture scope could not traverse it. A TTS dilator was passed through       the scope. Dilation with an 02-12-09 mm balloon and a 12-13.5-15 mm  balloon dilator was performed to 12 mm under fluoroscopic guidance, the       distal esophagus was quite spastic. Approriate dilation effect was noted       with balloon dilation just proximal to the GEJ.      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      The exam of the esophagus was otherwise normal. Proximal esophagus was       not dilated.      Biopsies were obtained from the proximal and distal esophagus with cold       forceps for histology to rule out eosinophilic esophagitis.      The entire examined stomach was normal.      The examined duodenum was normal. Impression:               - Z-line regular, 38 cm from the  incisors.                           - Very spastic distal esophagus with                            benign-appearing esophageal stenosis, however was                            able to traverse it with the regular upper                            endoscope which was not the case on the last exam.                            Dilated to 12mm with good result.                           - Diverticulum in the lower third of the esophagus.                           - Normal esophagus otherwise - biopsies taken to                            rule out EoE.                           - Normal stomach.                           - Normal examined duodenum.                           Overall while the patient does have a stricture                            there, it was not nearly as severe as previously                            appreciated on prior EGD and barium study. I  suspect she may more likely has a motility disorder                            (perhaps GEJ outflow obstruction, typical changes                            of achalsia not seen - but needs to be ruled out).                            Prior severe changes on EGD / barium study may more                            likely were result of esophagal spasm in the                            setting of moderate stricture. Recommendation:           - Return patient to hospital ward for ongoing care.                           - Full liquid diet today, can advance to very soft                            diet if tolerated tomorrow (would not advance                            beyond soft diet, at risk for impaction)                           - Continue present medications. Can resume                            prophylactic lovenox tonight                           - Await pathology results.                           - Continue pantoprazole 40mg  / day                           - Recommend esophageal manometry as  outpatient to                            assess for underlying dysmotility and follow up EGD                            with further dilation. She can do this with her                            primary GI [facility]) Procedure Code(s):        --- Professional ---  [id number], Esophagogastroduodenoscopy, flexible,                            transoral; with transendoscopic balloon dilation of                            esophagus (less than 30 mm diameter)                           43239, 59, Esophagogastroduodenoscopy, flexible,                            transoral; with biopsy, single or multiple Diagnosis Code(s):        --- Professional ---                           K22.2, Esophageal obstruction                           Q39.6, Congenital diverticulum of esophagus                           R13.10, Dysphagia, unspecified CPT copyright 2022 American Medical Association. All rights reserved. The codes documented in this report are preliminary and upon coder review may  be revised to meet current compliance requirements. Viviann Spare P. Sharod Petsch, MD 05/03/2023 8:41:27 AM This report has been signed electronically. Number of Addenda: 0

## 2023-05-03 NOTE — Anesthesia Postprocedure Evaluation (Signed)
Anesthesia Post Note  Patient: [name]  Procedure(s) Performed: ESOPHAGOGASTRODUODENOSCOPY (EGD) WITH PROPOFOL Balloon dilation wire-guided BIOPSY     Patient location during evaluation: Endoscopy Anesthesia Type: MAC Level of consciousness: awake and alert Pain management: pain level controlled Vital Signs Assessment: post-procedure vital signs reviewed and stable Respiratory status: spontaneous breathing, nonlabored ventilation and respiratory function stable Cardiovascular status: stable and blood pressure returned to baseline Postop Assessment: no apparent nausea or vomiting Anesthetic complications: no   No notable events documented.  Last Vitals:  Vitals:   05/03/23 0840 05/03/23 0850  BP: (!) 99/59 (!) 95/52  Pulse: 89 89  Resp: 18 18  Temp:    SpO2: 96% 97%    Last Pain:  Vitals:   05/03/23 0850  TempSrc:   PainSc: 0-No pain                 Talbert Trembath

## 2023-05-03 NOTE — Plan of Care (Signed)
  Problem: Consults Goal: RH SPINAL CORD INJURY PATIENT EDUCATION Description:  See Patient Education module for education specifics.  Outcome: Progressing   Problem: SCI BOWEL ELIMINATION Goal: RH STG MANAGE BOWEL WITH ASSISTANCE Description: STG Manage Bowel with min Assistance. Outcome: Progressing   Problem: SCI BOWEL ELIMINATION Goal: RH STG SCI MANAGE BOWEL WITH MEDICATION WITH ASSISTANCE Description: STG SCI Manage bowel with medication with min assistance. Outcome: Progressing

## 2023-05-03 NOTE — Anesthesia Preprocedure Evaluation (Signed)
Anesthesia Evaluation  Patient identified by MRN, date of birth, ID band Patient awake    Reviewed: Allergy & Precautions, NPO status , Patient's Chart, lab work & pertinent test results, reviewed documented beta blocker date and time   History of Anesthesia Complications Negative for: history of anesthetic complications  Airway Mallampati: II  TM Distance: >3 FB Neck ROM: Full    Dental no notable dental hx. (+) Chipped, Missing, Poor Dentition, Dental Advisory Given   Pulmonary neg pulmonary ROS, neg shortness of breath, neg sleep apnea, neg COPD, neg recent URI   breath sounds clear to auscultation       Cardiovascular negative cardio ROS  Rhythm:Regular  Left ventricle: The cavity size was normal. Systolic function was    normal. The estimated ejection fraction was in the range of 60%    to 65%. Wall motion was normal; there were no regional wall    motion abnormalities. Left ventricular diastolic function    parameters were normal.  - Aortic valve: There was mild regurgitation.  - Left atrium: The atrium was normal in size.  - Right ventricle: Systolic function was normal.  - Pulmonary arteries: Systolic pressure was within the normal    range.     Neuro/Psych C collar at bedside. Per pt (fell and had neck injury with nerve problems, told to wear c collar, does not wear it in bed)  Neuromuscular disease    GI/Hepatic Neg liver ROS,GERD  ,,  Endo/Other  diabetes, Type 2Hypothyroidism    Renal/GU CRFRenal disease     Musculoskeletal  (+) Arthritis ,    Abdominal   Peds  Hematology  (+) Blood dyscrasia, anemia Lab Results      Component                Value               Date                      WBC                      7.8                 04/27/2023                HGB                      8.5 (L)             04/27/2023                HCT                      25.6 (L)            04/27/2023                MCV                       86.2                04/27/2023                PLT                      253                 04/27/2023  Anesthesia Other Findings EKG shows Rbbb. EF 60-65 2 y ago.  Reproductive/Obstetrics                              Anesthesia Physical Anesthesia Plan  ASA: III  Anesthesia Plan: MAC   Post-op Pain Management: Minimal or no pain anticipated   Induction: Intravenous  PONV Risk Score and Plan: 2 and Propofol infusion and Treatment may vary due to age or medical condition  Airway Management Planned: Nasal Cannula  Additional Equipment: None  Intra-op Plan:   Post-operative Plan:   Informed Consent: I have reviewed the patients History and Physical, chart, labs and discussed the procedure including the risks, benefits and alternatives for the proposed anesthesia with the patient or authorized representative who has indicated his/her understanding and acceptance.     Dental advisory given  Plan Discussed with: CRNA  Anesthesia Plan Comments:          Anesthesia Quick Evaluation

## 2023-05-03 NOTE — Transfer of Care (Signed)
Immediate Anesthesia Transfer of Care Note  Patient: [name]  Procedure(s) Performed: ESOPHAGOGASTRODUODENOSCOPY (EGD) WITH PROPOFOL Balloon dilation wire-guided BIOPSY  Patient Location: Endoscopy Unit  Anesthesia Type:MAC  Level of Consciousness: drowsy  Airway & Oxygen Therapy: Patient Spontanous Breathing  Post-op Assessment: Report given to RN and Post -op Vital signs reviewed and stable  Post vital signs: Reviewed and stable  Last Vitals:  Vitals Value Taken Time  BP 93/51 05/03/23 0830  Temp 36.2 C 05/03/23 0830  Pulse 92 05/03/23 0830  Resp 16 05/03/23 0830  SpO2 96 % 05/03/23 0830  Vitals shown include unfiled device data.  Last Pain:  Vitals:   05/03/23 0830  TempSrc: Temporal  PainSc:       Patients Stated Pain Goal: 0 (04/28/23 2020)  Complications: No notable events documented.

## 2023-05-03 NOTE — Interval H&P Note (Signed)
History and Physical Interval Note: PAtient here for EGD with dilation of severe distal esophageal stricture. No changes since I have last seen her. Planning for dilation using flouroscopy. I have discussed risks / benefits of this with her - this is a higher risk stricture for dilation, risks include esophageal injury / perforation, bleeding, chest pain, etc. She understands and wishes to proceed, further recommendations pending the results.   05/03/2023 7:17 AM  [name]  has presented today for surgery, with the diagnosis of esophageal stricture, dilation.  The various methods of treatment have been discussed with the patient and family. After consideration of risks, benefits and other options for treatment, the patient has consented to  Procedure(s): ESOPHAGOGASTRODUODENOSCOPY (EGD) WITH PROPOFOL (N/A) as a surgical intervention.  The patient's history has been reviewed, patient examined, no change in status, stable for surgery.  I have reviewed the patient's chart and labs.  Questions were answered to the patient's satisfaction.     Viviann Spare P Olie Scaffidi

## 2023-05-04 ENCOUNTER — Ambulatory Visit: Payer: Medicare HMO | Admitting: Neurosurgery

## 2023-05-04 ENCOUNTER — Encounter (HOSPITAL_COMMUNITY): Payer: Self-pay | Admitting: Gastroenterology

## 2023-05-04 LAB — CBC
HCT: 26.4 % — ABNORMAL LOW (ref 36.0–46.0)
Hemoglobin: 8.5 g/dL — ABNORMAL LOW (ref 12.0–15.0)
MCH: 28.1 pg (ref 26.0–34.0)
MCHC: 32.2 g/dL (ref 30.0–36.0)
MCV: 87.4 fL (ref 80.0–100.0)
Platelets: 313 10*3/uL (ref 150–400)
RBC: 3.02 MIL/uL — ABNORMAL LOW (ref 3.87–5.11)
RDW: 14.5 % (ref 11.5–15.5)
WBC: 6.3 10*3/uL (ref 4.0–10.5)
nRBC: 0 % (ref 0.0–0.2)

## 2023-05-04 LAB — COMPREHENSIVE METABOLIC PANEL
ALT: 18 U/L (ref 0–44)
AST: 26 U/L (ref 15–41)
Albumin: 2.7 g/dL — ABNORMAL LOW (ref 3.5–5.0)
Alkaline Phosphatase: 189 U/L — ABNORMAL HIGH (ref 38–126)
Anion gap: 7 (ref 5–15)
BUN: 17 mg/dL (ref 8–23)
CO2: 23 mmol/L (ref 22–32)
Calcium: 8.4 mg/dL — ABNORMAL LOW (ref 8.9–10.3)
Chloride: 105 mmol/L (ref 98–111)
Creatinine, Ser: 1.34 mg/dL — ABNORMAL HIGH (ref 0.44–1.00)
GFR, Estimated: 44 mL/min — ABNORMAL LOW (ref >60)
Glucose, Bld: 222 mg/dL — ABNORMAL HIGH (ref 70–99)
Potassium: 4.4 mmol/L (ref 3.5–5.1)
Sodium: 135 mmol/L (ref 135–145)
Total Bilirubin: 0.6 mg/dL (ref 0.3–1.2)
Total Protein: 6.8 g/dL (ref 6.5–8.1)

## 2023-05-04 LAB — GLUCOSE, CAPILLARY
Glucose-Capillary: 117 mg/dL — ABNORMAL HIGH (ref 70–99)
Glucose-Capillary: 123 mg/dL — ABNORMAL HIGH (ref 70–99)
Glucose-Capillary: 142 mg/dL — ABNORMAL HIGH (ref 70–99)
Glucose-Capillary: 157 mg/dL — ABNORMAL HIGH (ref 70–99)

## 2023-05-04 MED ORDER — ENOXAPARIN SODIUM 40 MG/0.4ML IJ SOSY
40.0000 mg | PREFILLED_SYRINGE | Freq: Every day | INTRAMUSCULAR | Status: DC
  Administered 2023-05-04 – 2023-05-11 (×8): 40 mg via SUBCUTANEOUS
  Filled 2023-05-04 (×8): qty 0.4

## 2023-05-04 NOTE — Progress Notes (Signed)
Occupational Therapy Session Note  Patient Details  Name: [name] MRN: [id number] Date of Birth: 08-22-1955  Today's Date: 05/04/2023 OT Individual Time: 0700-0811 OT Individual Time Calculation (min): 71 min    Short Term Goals: Week 1:  OT Short Term Goal 1 (Week 1): Patient to perform toilet transfer with Min assist OT Short Term Goal 1 - Progress (Week 1): Met OT Short Term Goal 2 (Week 1): Patient to perform LB dressing with AE as needed with Min assist OT Short Term Goal 2 - Progress (Week 1): Met OT Short Term Goal 3 (Week 1): Patient to perform bathing with AE as needed with Min assist OT Short Term Goal 3 - Progress (Week 1): Met OT Short Term Goal 4 (Week 1): Patient to perform grooming with set up assist sitting at sink  Skilled Therapeutic Interventions/Progress Updates:    Pt resting in bed upon arrival. OT intervention with focus on bed mobility, sitting balance, sit<>styand, standing balance, functional transfers, and activity tolerance to increase independene with BADLs. Pt requires more then a reasonable amount of time to complete all tasks. OTA applied Ted Hose to pt's BLE prior to sitting EOB. Supine>sit with supervision and HOB elevated. Pt reports bed at home has similar feature. UB dressing with supervision. Assistance to don abdominal binder. Min A for LB dressing (thread BLE into pants without AE). Pt able to pull over hips and fasten without assistance. Pt amb with RW to recliner on other side of bed-CGA. Pt remained in reacliner with all needs wihtin reach.   Therapy Documentation Precautions:  Precautions Precautions: Cervical, Fall Precaution Comments: verbally reviewed brace wear Required Braces or Orthoses: Cervical Brace Cervical Brace: Hard collar Restrictions Weight Bearing Restrictions: Yes RLE Weight Bearing: Weight bearing as tolerated LLE Weight Bearing: Weight bearing as tolerated Other Position/Activity Restrictions: per orders, ok to  donn collar edge of bed, amb to bathroom/shower without c collar Pain:  Pt c/o RUE muscle soreness from previous days exercises; RN aware, soft tissue massage   Therapy/Group: Individual Therapy  Rich Brave 05/04/2023, 8:12 AM

## 2023-05-04 NOTE — Plan of Care (Signed)
  Problem: Consults Goal: RH SPINAL CORD INJURY PATIENT EDUCATION Description:  See Patient Education module for education specifics.  Outcome: Progressing   Problem: SCI BOWEL ELIMINATION Goal: RH STG MANAGE BOWEL WITH ASSISTANCE Description: STG Manage Bowel with min Assistance. Outcome: Progressing   Problem: SCI BOWEL ELIMINATION Goal: RH STG SCI MANAGE BOWEL WITH MEDICATION WITH ASSISTANCE Description: STG SCI Manage bowel with medication with min assistance. Outcome: Progressing

## 2023-05-04 NOTE — Progress Notes (Signed)
Occupational Therapy Session Note  Patient Details  Name: Tijana Trudgeon MRN: [id number] Date of Birth: 1955-09-11  Today's Date: 05/04/2023 OT Individual Time: 1300-1356 OT Individual Time Calculation (min): 56 min    Short Term Goals: Week 2:  OT Short Term Goal 1 (Week 2): STG=LTG 2/2 ELOS  Skilled Therapeutic Interventions/Progress Updates:    Pt resting in bed upon arrival. Husband present for education. Reviewed pt's level of assistance and DME recommendations. Reviewed purpose of [facility] and abdominal binder. Recommended obtaining BP monitor for home use. Supine>sit EOB with supervision. Sit<>stand and amb with RW at supervision/CGA. Demonstrated and pt return demonstrated TTB transfers. TTB info provided to husband. Pt remained seated in w/c with husbnad present. All needs wihtin reach.   Therapy Documentation Precautions:  Precautions Precautions: Cervical, Fall Precaution Comments: verbally reviewed brace wear Required Braces or Orthoses: Cervical Brace Cervical Brace: Hard collar Restrictions Weight Bearing Restrictions: Yes RLE Weight Bearing: Weight bearing as tolerated LLE Weight Bearing: Weight bearing as tolerated Other Position/Activity Restrictions: per orders, ok to donn collar edge of bed, amb to bathroom/shower without c collar Pain:  Pt c/o RUE discomfort (unrated); repositioned   Therapy/Group: Individual Therapy  Rich Brave 05/04/2023, 2:48 PM

## 2023-05-04 NOTE — Progress Notes (Signed)
Physical Therapy Session Note  Patient Details  Name: [name] MRN: [id number] Date of Birth: [birth year]  Today's Date: 05/04/2023 PT Individual Time: 1430-1530 PT Individual Time Calculation (min): 60 min   Short Term Goals: Week 2:  PT Short Term Goal 1 (Week 2): pt will ambulate >50 ft with LRAD PT Short Term Goal 2 (Week 2): pt will tolerate sitting OOB >4 hours PT Short Term Goal 3 (Week 2): Pt will tolerate standing > 2 min without changes in vital signs.  Skilled Therapeutic Interventions/Progress Updates:    Pt seated in w/c on arrival and agreeable to therapy. Pt reports unrated pain with mobility, premedicated. Rest and positioning provided as needed.   Session focused on family education with pt's husband and son. Pt transported to therapy gym for time management and energy conservation. Pt performed car transfer x 3 with each person with CGA-supervision. Able to stand from low car seat with close supervision. Discussed plan for discharge, provided education on breaking down/setting up equipment, bed mobility and positioning, etc. Pt's husband expressed and demoed understanding.  Pt also performed ambulatory transfer to bathroom and had continent bladder void with CGA for 3/3 toileting tasks. Pt returned to bed with no bed rail with supervision. was left with all needs in reach and alarm active.   Therapy Documentation Precautions:  Precautions Precautions: Cervical, Fall Precaution Comments: verbally reviewed brace wear Required Braces or Orthoses: Cervical Brace Cervical Brace: Hard collar Restrictions Weight Bearing Restrictions: Yes RLE Weight Bearing: Weight bearing as tolerated LLE Weight Bearing: Weight bearing as tolerated Other Position/Activity Restrictions: per orders, ok to donn collar edge of bed, amb to bathroom/shower without c collar General:       Therapy/Group: Individual Therapy  Juluis Rainier 05/04/2023, 3:35 PM

## 2023-05-04 NOTE — Progress Notes (Signed)
Patient ID: [name], female   DOB: 1955/11/11, 67 y.o.   MRN: [id number]  Have given pt her [employer] forms and FMLA for her husband. Have faxed hers, but no fax number on husband's forms. Pt voiced [employer] expects her to come back to work on Monday 11/4. She is being discharged home on Friday. Will update after conference tomorrow.

## 2023-05-04 NOTE — Progress Notes (Signed)
PROGRESS NOTE   Subjective/Complaints:  Pt reports full liquid ok,m  but really wants to advance diet to a soft diet.   Did dilation yesterday-  LBM Saturday- constant stool on Saturday for ~ 12-18 hours- "chunky mess".  LBM Sunday- was better  Asking when can advance diet.   ROS:   Pt denies SOB, abd pain, CP, N/V/C/D, and vision changes   Except for HPI  Objective:   DG C-Arm 1-60 Min  Result Date: 05/03/2023 CLINICAL DATA:  EGD and biopsy EXAM: DG C-ARM 1-60 MIN FLUOROSCOPY: Radiation Exposure Index (if provided by the fluoroscopic device): Fluoroscopy was provided, dose 4.53 mGy air kerma COMPARISON:  None Available. FINDINGS: Two images demonstrate endoscope and a opacified balloon segment along a wire, presumably at the gastroesophageal junction. Please see dedicated procedure report. IMPRESSION: 1. Fluoroscopy provided for endoscopy. Electronically Signed   By: Gaylyn Rong M.D.   On: 05/03/2023 10:18   No results for input(s): "WBC", "HGB", "HCT", "PLT" in the last 72 hours.   No results for input(s): "NA", "K", "CL", "CO2", "GLUCOSE", "BUN", "CREATININE", "CALCIUM" in the last 72 hours.   Intake/Output Summary (Last 24 hours) at 05/04/2023 0833 Last data filed at 05/04/2023 0807 Gross per 24 hour  Intake 120 ml  Output --  Net 120 ml        Physical Exam: Vital Signs Blood pressure 104/68, pulse 72, temperature 98.4 F (36.9 C), temperature source Oral, resp. rate 18, height 5\' 7"  (1.702 m), weight 58.1 kg, SpO2 97%.     General: awake, alert, appropriate, sitting up at EOB; bright; OT at bedside; NAD HENT: conjugate gaze; oropharynx moist CV: regular rate and rhythm; no JVD Pulmonary: CTA B/L; no W/R/R- good air movement GI: soft, NT, ND, (+)BS- finished tray 100% Psychiatric: appropriate- making jokes- sometimes hard to hear Neurological: Ox3- somewhat tangential, but much improved  -  MAS of 1- but less than yesterday in Ue's with hoffman's (+) B/L- however more ROM of arms B/L  Moving all 4 extremities in bed Skin: Protective dressing on heels been removed, no open wounds noted Neuro: Alert and oriented x 4, follows commands, cranial nerves II through XII intact, voice is a little hoarse, memory intact, fair insight Sensation intact to light touch in all 4 extremities however decreased in stocking glove type distribution below her knees-she reports this is chronic RUE: 4-/5 Deltoid, 4/5 Biceps, 4/5 Triceps, 4/5 Wrist Ext, 4/5 Grip, intrinsic hand muscles/fifth digit abduction 1 out of 5 LUE: 4/5 Deltoid, 4/5 Biceps, 4/5 Triceps, 4/5 Wrist Ext, 4/5 Grip, intrinsic hand muscles/fifth digit abduction 3 out of 5 RLE: HF 2/5, KE 3/5, ADF 1/5, APF 2-3/5 LLE: HF 4/5, KE 4/5,  ADF 4/5, APF 4/5 No ankle clonus, exam limited by sedation from anesthesia on 10/27 Musculoskeletal:  Decreased C-spine ROM Tender over right greater trochanter, pelvis Pain with ROM of right lower extremity  Assessment/Plan: 1. Functional deficits which require 3+ hours per day of interdisciplinary therapy in a comprehensive inpatient rehab setting. Physiatrist is providing close team supervision and 24 hour management of active medical problems listed below. Physiatrist and rehab team continue to assess barriers to discharge/monitor patient progress  toward functional and medical goals  Care Tool:  Bathing    Body parts bathed by patient: Right arm, Left arm, Chest, Abdomen, Front perineal area, Buttocks, Right upper leg, Left upper leg, Face   Body parts bathed by helper: Right lower leg, Left lower leg     Bathing assist Assist Level: Minimal Assistance - Patient > 75%     Upper Body Dressing/Undressing Upper body dressing   What is the patient wearing?: Pull over shirt    Upper body assist Assist Level: Supervision/Verbal cueing    Lower Body Dressing/Undressing Lower body dressing       What is the patient wearing?: Pants     Lower body assist Assist for lower body dressing: Minimal Assistance - Patient > 75%     Toileting Toileting    Toileting assist Assist for toileting: Minimal Assistance - Patient > 75%     Transfers Chair/bed transfer  Transfers assist     Chair/bed transfer assist level: Minimal Assistance - Patient > 75%     Locomotion Ambulation   Ambulation assist   Ambulation activity did not occur: Safety/medical concerns  Assist level: Minimal Assistance - Patient > 75% Assistive device: Walker-rolling Max distance: 25   Walk 10 feet activity   Assist  Walk 10 feet activity did not occur: Safety/medical concerns  Assist level: Minimal Assistance - Patient > 75% Assistive device: Walker-rolling   Walk 50 feet activity   Assist Walk 50 feet with 2 turns activity did not occur: Safety/medical concerns         Walk 150 feet activity   Assist Walk 150 feet activity did not occur: Safety/medical concerns         Walk 10 feet on uneven surface  activity   Assist Walk 10 feet on uneven surfaces activity did not occur: Safety/medical concerns         Wheelchair     Assist Is the patient using a wheelchair?: Yes Type of Wheelchair: Manual Wheelchair activity did not occur: Safety/medical concerns         Wheelchair 50 feet with 2 turns activity    Assist    Wheelchair 50 feet with 2 turns activity did not occur: Safety/medical concerns       Wheelchair 150 feet activity     Assist  Wheelchair 150 feet activity did not occur: Safety/medical concerns       Blood pressure 104/68, pulse 72, temperature 98.4 F (36.9 C), temperature source Oral, resp. rate 18, height 5\' 7"  (1.702 m), weight 58.1 kg, SpO2 97%.  Medical Problem List and Plan: 1. Functional deficits secondary to incomplete quadriplegia- ASIA D after fall with history of longstanding cervical stenosis             -patient may   shower             -ELOS/Goals: 12 to 16 days, PT/OT supervision to mod I, SLP N/A           D/c 11/1  Con't CIR PT and OT  Balloon dilation yesterday- can advance to soft diet today per GI note.   2.  Antithrombotics: -DVT/anticoagulation:  Pharmaceutical: Lovenox             -antiplatelet therapy: none   3. Pain Management: Tylenol, Percocet as needed             -continue gabapentin 200 mg twice daily   10/17- no complaints of pain- con't regimen   10/19-reports pain in her  right arm, right hip and right foot.  Overall under control with current medications   10/20 reports pain is overall under control, continue current regimen  10/23- is taking Oxy which can contribute to sedation- last dose 8pm night prior- changed Oxy to Tramadol 50 mg q12 hours prn  10/24- Will add Voltaren gel 2 G QID for R toe pain- doesn't look like gout on exam, however if gets worse, will change to steroids.   10/28- not complaining about R great toe, or any pain 4. Mood/Behavior/Sleep: LCSW to evaluate and provide emotional support             -antipsychotic agents: n/a   5. Neuropsych/cognition: This patient is capable of making decisions on her own behalf.   6. Skin/Wound Care: Routine skin care checks  10/24- Nonblancahable on R heel- stage I- will make sure wearing PRAFO and d/w nursing- will get foam as well for R heel   7. Fluids/Electrolytes/Nutrition: Routine Is and Os and follow-up chemistries             --D3 diet, thin liquids             -lactose intolerant: continue Lactaid   8: Hypothyroidism with history of multinodular goiter s/p total thyroidectomy: continue Synthroid.  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  Hemoglobin A1c was 10.3 in 2014             -per DC: "Patient may not need any oral meds @ discharge home along with her Mounjaro."    10/17- pt's CBGs 115-173 in  last 24 hours- will monitor  10/18- overall controlled- con't regimen  -10/ 20- 10/22 CBGs well-controlled, continue to monitor  10/23- Cbgs running well- con't regimen  10/28- 1 BG elevated in 180s- otherwise less than 150 CBG (last 3)  Recent Labs    05/03/23 1734 05/03/23 2058 05/04/23 0545  GLUCAP 120* 184* 117*    11: Orthostatic hypotension: place TED hose and abd binder.  Improved after fluids given   10/17- will probably be an issue today- not drinking well  10/18-BP running 90's to 100s systolic- if has orthostasis- will need Midodrine   10/ 20 BP controlled, continue to monitor with activity  10/21- HR 107- and BP on low side- will monitor  10/22- started midodrine for OH- will monitor  10/23- said still had some dizziness- will con't Midodrine  10/25- BP better with therapy- con't midodrine  10/28- BP still on low side, but less orthostasis     05/04/2023    4:50 AM 05/03/2023    7:55 PM 05/03/2023    2:47 PM  Vitals with BMI  Systolic 104 136 98  Diastolic 68 69 51  Pulse 72 81 79    12: Normocytic anemia, chronic: follow-up CBC   13: GI prophylaxis: continue PPI   14: SCI: continue cervical collar when out of bed or transporting               -bladder scan/check PVRs    10/17- her RLE ankle DF/PF is stronger than seen yesterday - and LLE stable at 3-/5- will wait on PRAFOs. Also doesn't appear to need [facility]  10/22- got PRAFOs per therapy- heels a little red  10/23- Pt hasn't worn PRAFOs yet- on floor in bag- will d/w Nursing 10/24- PRAFO on R and Prevalon on L  15:  Right superior and inferior pubic rami fractures: Weightbearing as tolerated   16: Right arm pain: X-ray on 10/13 negative   17: Constipation vs neurogenic bowel: Give sorbitol 60 cc now>>enema if no results   18. CKD IIIA.  Recheck labs.  Avoid nephrotoxic medications   10/17-BUN up to 1.39 from 1.28 however BUN better at 28- down from 37- will push fluids- write order and recheck in AM-  will also check CMP weekly. 10/9 creatinine/BUN stable at 1.24/30 10/21- Cr 1.21 and BUN 29- stable for her- pepsi drinker 10/22- About the same with BUN 25 and Cr 1.27 after IVFs- will wait on more IVFs 10/28- labs pending 19. Neurogenic bowel?  10/17- LBM last night after fleets enema- but had been >5 days since LBM- will monitor and see if needs bowel program  10/ 20 add Senokot at bedtime  10/23- LBM yesterday 20. Neurogenic bladder?  10/17- having a lot of incontinent voids- will check bladder scans to see if due to overflow and is retaining- have ordered  10/18- 2 bladder scans- 0 and 12ml- will con't for now- but her incontinence has resolved it appears, so doing better  -10/ 20 continent, bladder scans not elevated 21. Sedation  10/21- might be baclofen we started- will monitor 22. Spasticity-   10/21- started Baclofen 5 mg TID for this- might be contributing to #21- -also added [employer] PRAFO order for RLE  10/22- therapy feels issues with therapy today were due to orthostatic hypotension, not sedation from baclofen  - pt's spasticity is better with baclofen  10/23- stopped Baclofen for now since so sedated  10/28- sedation resolved 23. Orthostatic hypotension  10/22- will start Midodrine 5 mg TID-with meals for low BP in therapy today even with TEDs- also been using ACE wraps intermittently.   Continue midodrine  24. Nausea/vomiting  10/23- pt had episode of vomiting this AM- has Zofran as needed for nausea- doesn't want right now- don't want her to have phenergan, because more sedating. 10/27: appears to have resolved  25. Sedation- Lethargy  10/23- will stop Oxy; decrease Gabapentin to 100 mg BID; reviewed her TSH- is 0.5- added Amantadine 100 mg daily to see if will help her wake up/have more energy.   10/27 sedated today from anesthesia  26. GI issues/dysphagia  10/23- per GI prior to admission, needed an endoscopy and barium swallow- if MBSS shows it, will call consult  to GI  10/24- pt said doesn't think GI issues main cause of not eating- feels it's poor appetite  10/25- due to Severe esophageal stricture- less than 5mm- GI checking to see if can be dilated, etc- barium study ordered  10/27: discussed with GI and she tolerated dilation  10/28- will upgrade diet to soft D3 diet per GI- could do today and will NOT upgrade to regular-  27. Poor appetite  10/24- will add Megace for appetite. Don't want to add Periactin or Remeron because sedating- also ate <25% of tray this AM- however said would eat banana when done with PT  10/25- place don lqiuid diet and changed meds to liquid/crushed  10/26: Megace held temporarily since can not be crushed and is not in liquid form  10/27: discussed with GI and continued liquid diet recommended  28. Hypotension: continue midodrine  29. Loose stool: 1x imodium ordered    I spent a total of 37   minutes on total care today- >50% coordination of care- due to  D/w OT and pt about diet and loose  stools- have resolved- and reviewed GI note for soft diet   LOS: 12 days A FACE TO FACE EVALUATION WAS PERFORMED  Siya Flurry 05/04/2023, 8:33 AM

## 2023-05-05 ENCOUNTER — Encounter (HOSPITAL_COMMUNITY): Payer: Self-pay | Admitting: Gastroenterology

## 2023-05-05 LAB — GLUCOSE, CAPILLARY
Glucose-Capillary: 138 mg/dL — ABNORMAL HIGH (ref 70–99)
Glucose-Capillary: 131 mg/dL — ABNORMAL HIGH (ref 70–99)
Glucose-Capillary: 139 mg/dL — ABNORMAL HIGH (ref 70–99)
Glucose-Capillary: 193 mg/dL — ABNORMAL HIGH (ref 70–99)

## 2023-05-05 LAB — SURGICAL PATHOLOGY

## 2023-05-05 MED ORDER — MIDODRINE HCL 5 MG PO TABS
10.0000 mg | ORAL_TABLET | Freq: Three times a day (TID) | ORAL | Status: DC
  Administered 2023-05-05 – 2023-05-09 (×11): 10 mg via ORAL
  Filled 2023-05-05 (×11): qty 2

## 2023-05-05 MED ORDER — MIDODRINE HCL 5 MG PO TABS
10.0000 mg | ORAL_TABLET | Freq: Three times a day (TID) | ORAL | Status: DC
  Administered 2023-05-05: 10 mg via ORAL
  Filled 2023-05-05: qty 2

## 2023-05-05 NOTE — Progress Notes (Signed)
Patient ID: [name], female   DOB: July 24, 1955, 67 y.o.   MRN: [id number]  Met with pt to give her team conference update regarding goals of supervision-mod/I level and discharge 11/1. She feels better but still having BP issues. She did give the FMLA forms to her husband to take in to his employer. Discussed the equipment needed: wheelchair rolling walker, bedside commode and tub bench. Husband aware to get the tub bench and will check to see if pt's mom had a walker. Made aware insurance only covers one. She does want home health feels it would be better. Will continue to work on discharge needs.

## 2023-05-05 NOTE — Progress Notes (Signed)
PROGRESS NOTE   Subjective/Complaints:   Pt reports feeling lightheaded - sensation- tingling in esophagus.   In shower- wearing TEDs due to lightheadedness this AM  Swallowing better current D3 diet.    ROS:   Pt denies SOB, abd pain, CP, N/V/C/D, and vision changes   Except for HPI  Objective:   No results found. Recent Labs    05/04/23 0750  WBC 6.3  HGB 8.5*  HCT 26.4*  PLT 313     Recent Labs    05/04/23 0750  NA 135  K 4.4  CL 105  CO2 23  GLUCOSE 222*  BUN 17  CREATININE 1.34*  CALCIUM 8.4*     Intake/Output Summary (Last 24 hours) at 05/05/2023 1017 Last data filed at 05/04/2023 2342 Gross per 24 hour  Intake 836 ml  Output 200 ml  Net 636 ml        Physical Exam: Vital Signs Blood pressure 109/71, pulse 77, temperature 98.1 F (36.7 C), temperature source Oral, resp. rate 16, height 5\' 7"  (1.702 m), weight 58.1 kg, SpO2 96%.      General: awake, alert, appropriate, sitting in shower with thigh high TEDs; NAD HENT: conjugate gaze; oropharynx moist CV: regular rate and rhythm; no JVD Pulmonary: CTA B/L; no W/R/R- good air movement GI: soft, NT, ND, (+)BS Psychiatric: appropriate- interactive Neurological: Ox3- tangential but stayoing on topic better   - MAS of 1- but less than yesterday in [facility] with hoffman's (+) B/L- however more ROM of arms B/L  Moving all 4 extremities in bed Skin: Protective dressing on heels been removed, no open wounds noted Neuro: Alert and oriented x 4, follows commands, cranial nerves II through XII intact, voice is a little hoarse, memory intact, fair insight Sensation intact to light touch in all 4 extremities however decreased in stocking glove type distribution below her knees-she reports this is chronic RUE: 4-/5 Deltoid, 4/5 Biceps, 4/5 Triceps, 4/5 Wrist Ext, 4/5 Grip, intrinsic hand muscles/fifth digit abduction 1 out of 5 LUE: 4/5  Deltoid, 4/5 Biceps, 4/5 Triceps, 4/5 Wrist Ext, 4/5 Grip, intrinsic hand muscles/fifth digit abduction 3 out of 5 RLE: HF 2/5, KE 3/5, ADF 1/5, APF 2-3/5 LLE: HF 4/5, KE 4/5,  ADF 4/5, APF 4/5 No ankle clonus, exam limited by sedation from anesthesia on 10/27 Musculoskeletal:  Decreased C-spine ROM Tender over right greater trochanter, pelvis Pain with ROM of right lower extremity  Assessment/Plan: 1. Functional deficits which require 3+ hours per day of interdisciplinary therapy in a comprehensive inpatient rehab setting. Physiatrist is providing close team supervision and 24 hour management of active medical problems listed below. Physiatrist and rehab team continue to assess barriers to discharge/monitor patient progress toward functional and medical goals  Care Tool:  Bathing    Body parts bathed by patient: Right arm, Left arm, Chest, Abdomen, Front perineal area, Buttocks, Right upper leg, Left upper leg, Face   Body parts bathed by helper: Right lower leg, Left lower leg     Bathing assist Assist Level: Minimal Assistance - Patient > 75%     Upper Body Dressing/Undressing Upper body dressing   What is the patient wearing?: Pull over  shirt    Upper body assist Assist Level: Supervision/Verbal cueing    Lower Body Dressing/Undressing Lower body dressing      What is the patient wearing?: Pants     Lower body assist Assist for lower body dressing: Minimal Assistance - Patient > 75%     Toileting Toileting    Toileting assist Assist for toileting: Contact Guard/Touching assist     Transfers Chair/bed transfer  Transfers assist     Chair/bed transfer assist level: Minimal Assistance - Patient > 75%     Locomotion Ambulation   Ambulation assist   Ambulation activity did not occur: Safety/medical concerns  Assist level: Minimal Assistance - Patient > 75% Assistive device: Walker-rolling Max distance: 25   Walk 10 feet activity   Assist  Walk  10 feet activity did not occur: Safety/medical concerns  Assist level: Minimal Assistance - Patient > 75% Assistive device: Walker-rolling   Walk 50 feet activity   Assist Walk 50 feet with 2 turns activity did not occur: Safety/medical concerns         Walk 150 feet activity   Assist Walk 150 feet activity did not occur: Safety/medical concerns         Walk 10 feet on uneven surface  activity   Assist Walk 10 feet on uneven surfaces activity did not occur: Safety/medical concerns         Wheelchair     Assist Is the patient using a wheelchair?: Yes Type of Wheelchair: Manual Wheelchair activity did not occur: Safety/medical concerns         Wheelchair 50 feet with 2 turns activity    Assist    Wheelchair 50 feet with 2 turns activity did not occur: Safety/medical concerns       Wheelchair 150 feet activity     Assist  Wheelchair 150 feet activity did not occur: Safety/medical concerns       Blood pressure 109/71, pulse 77, temperature 98.1 F (36.7 C), temperature source Oral, resp. rate 16, height 5\' 7"  (1.702 m), weight 58.1 kg, SpO2 96%.  Medical Problem List and Plan: 1. Functional deficits secondary to incomplete quadriplegia- ASIA D after fall with history of longstanding cervical stenosis             -patient may  shower             -ELOS/Goals: 12 to 16 days, PT/OT supervision to mod I, SLP N/A           D/c 11/1  Con't CIR PT and OT  Team conference today to finalize d/c this week  Balloon dilation yesterday- can advance to soft diet today per GI note.   2.  Antithrombotics: -DVT/anticoagulation:  Pharmaceutical: Lovenox             -antiplatelet therapy: none   3. Pain Management: Tylenol, Percocet as needed             -continue gabapentin 200 mg twice daily   10/17- no complaints of pain- con't regimen   10/19-reports pain in her right arm, right hip and right foot.  Overall under control with current medications    10/20 reports pain is overall under control, continue current regimen  10/23- is taking Oxy which can contribute to sedation- last dose 8pm night prior- changed Oxy to Tramadol 50 mg q12 hours prn  10/24- Will add Voltaren gel 2 G QID for R toe pain- doesn't look like gout on exam, however if gets worse, will change  to steroids.   10/28- not complaining about R great toe, or any pain 4. Mood/Behavior/Sleep: LCSW to evaluate and provide emotional support             -antipsychotic agents: n/a   5. Neuropsych/cognition: This patient is capable of making decisions on her own behalf.   6. Skin/Wound Care: Routine skin care checks  10/24- Nonblancahable on R heel- stage I- will make sure wearing PRAFO and d/w nursing- will get foam as well for R heel   7. Fluids/Electrolytes/Nutrition: Routine Is and Os and follow-up chemistries             --D3 diet, thin liquids             -lactose intolerant: continue Lactaid   8: Hypothyroidism with history of multinodular goiter s/p total thyroidectomy: continue Synthroid.  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  Hemoglobin A1c was 10.3 in 2014             -per DC: "Patient may not need any oral meds @ discharge home along with her Mounjaro."    10/17- pt's CBGs 115-173 in last 24 hours- will monitor  10/18- overall controlled- con't regimen  -10/ 20- 10/22 CBGs well-controlled, continue to monitor  10/23- Cbgs running well- con't regimen  10/28- 1 BG elevated in 180s- otherwise less than 150  10/29- overall controlled- needs to get Joycelyn Man brought in from home CBG (last 3)  Recent Labs    05/04/23 1625 05/04/23 2052 05/05/23 0546  GLUCAP 142* 157* 138*    11: Orthostatic hypotension: place TED hose and abd binder.  Improved after fluids given   10/17- will probably be an issue today- not drinking well  10/18-BP running 90's  to 100s systolic- if has orthostasis- will need Midodrine   10/ 20 BP controlled, continue to monitor with activity  10/21- HR 107- and BP on low side- will monitor  10/22- started midodrine for OH- will monitor  10/23- said still had some dizziness- will con't Midodrine  10/25- BP better with therapy- con't midodrine  10/28- BP still on low side, but less orthostasis   10/29- had severe Orthostasis this Am- but hadn't received her meds yet- will change to get at 6:30 in Am and 11 am and 1600 for last dose- so gets better control- also increased Midodrine to 10 mg TID    05/05/2023    3:13 AM 05/04/2023    7:25 PM 05/04/2023    2:33 PM  Vitals with BMI  Systolic 109 101 161  Diastolic 71 58 66  Pulse 77 63 88    12: Normocytic anemia, chronic: follow-up CBC   13: GI prophylaxis: continue PPI   14: SCI: continue cervical collar when out of bed or transporting               -bladder scan/check PVRs    10/17- her RLE ankle DF/PF is stronger than seen yesterday - and LLE stable at 3-/5- will wait on PRAFOs. Also doesn't appear to need [facility]  10/22- got PRAFOs per therapy- heels a little red  10/23- Pt hasn't worn PRAFOs yet- on floor in bag- will d/w Nursing 10/24- PRAFO on R and Prevalon on L  15: Right superior and inferior pubic rami fractures: Weightbearing as tolerated   16: Right arm pain: X-ray on 10/13 negative   17:  Constipation vs neurogenic bowel: Give sorbitol 60 cc now>>enema if no results   10/29- having BM's per pt 18. CKD IIIA.  Recheck labs.  Avoid nephrotoxic medications   10/17-BUN up to 1.39 from 1.28 however BUN better at 28- down from 37- will push fluids- write order and recheck in AM- will also check CMP weekly. 10/9 creatinine/BUN stable at 1.24/30 10/21- Cr 1.21 and BUN 29- stable for her- pepsi drinker 10/22- About the same with BUN 25 and Cr 1.27 after IVFs- will wait on more IVFs 10/29 Cr 1.34- and BUN 17- drinking a LOT of pepsi 19. Neurogenic  bowel?  10/17- LBM last night after fleets enema- but had been >5 days since LBM- will monitor and see if needs bowel program  10/ 20 add Senokot at bedtime  10/23- LBM yesterday 20. Neurogenic bladder?  10/17- having a lot of incontinent voids- will check bladder scans to see if due to overflow and is retaining- have ordered  10/18- 2 bladder scans- 0 and 12ml- will con't for now- but her incontinence has resolved it appears, so doing better  -10/ 20 continent, bladder scans not elevated 21. Sedation  10/21- might be baclofen we started- will monitor 22. Spasticity-   10/21- started Baclofen 5 mg TID for this- might be contributing to #21- -also added [employer] PRAFO order for RLE  10/22- therapy feels issues with therapy today were due to orthostatic hypotension, not sedation from baclofen  - pt's spasticity is better with baclofen  10/23- stopped Baclofen for now since so sedated  10/28- sedation resolved 23. Orthostatic hypotension  10/22- will start Midodrine 5 mg TID-with meals for low BP in therapy today even with TEDs- also been using ACE wraps intermittently.   Continue midodrine  24. Nausea/vomiting  10/23- pt had episode of vomiting this AM- has Zofran as needed for nausea- doesn't want right now- don't want her to have phenergan, because more sedating. 10/27: appears to have resolved  25. Sedation- Lethargy  10/23- will stop Oxy; decrease Gabapentin to 100 mg BID; reviewed her TSH- is 0.5- added Amantadine 100 mg daily to see if will help her wake up/have more energy.   10/27 sedated today from anesthesia  26. GI issues/dysphagia  10/23- per GI prior to admission, needed an endoscopy and barium swallow- if MBSS shows it, will call consult to GI  10/24- pt said doesn't think GI issues main cause of not eating- feels it's poor appetite  10/25- due to Severe esophageal stricture- less than 5mm- GI checking to see if can be dilated, etc- barium study ordered  10/27: discussed  with GI and she tolerated dilation  10/28- will upgrade diet to soft D3 diet per GI- could do today and will NOT upgrade to regular-  27. Poor appetite  10/24- will add Megace for appetite. Don't want to add Periactin or Remeron because sedating- also ate <25% of tray this AM- however said would eat banana when done with PT  10/25- place don lqiuid diet and changed meds to liquid/crushed  10/26: Megace held temporarily since can not be crushed and is not in liquid form  10/27: discussed with GI and continued liquid diet recommended  10/29- changed ot D3 diet  Eating better 28. Hypotension: continue midodrine  29. Loose stool: 1x imodium ordered     I spent a total of 36   minutes on total care today- >50% coordination of care- due to d/w OT and pt about orthstasis and how its affecting  therapy- will change- also team conference to finalize d/c     LOS: 13 days A FACE TO FACE EVALUATION WAS PERFORMED  Pellegrino Kennard 05/05/2023, 10:17 AM

## 2023-05-05 NOTE — Progress Notes (Signed)
Occupational Therapy Session Note  Patient Details  Name: [name] MRN: [id number] Date of Birth: 1955/09/09  Today's Date: 05/05/2023 OT Individual Time: 0700-0812 OT Individual Time Calculation (min): 72 min    Short Term Goals: Week 2:  OT Short Term Goal 1 (Week 2): STG=LTG 2/2 ELOS  Skilled Therapeutic Interventions/Progress Updates:    Pt resting in bed upon arrival. OT intervention with focus on bathing at shower level, toileting, UB dressing, standing balance, and activity tolerance. Ssquat pivot transfers with CGA. Toileting with CGA. Bathing with min A. UB dressing with supervision.   BP readings during session: Bed with HOB elevated: 106/56 Seated EOB: 80/47 Seated EOB after 5 mins: 92/51 Seated with Teds donned: 106/57 Pt c/o HA and dizziness when standing to don brief but unable to obtain BP. 98/57 after return to bed.   Pt remained in bed with all needs wihtin reach. Bed alarm activated.   Therapy Documentation Precautions:  Precautions Precautions: Cervical, Fall Precaution Comments: verbally reviewed brace wear Required Braces or Orthoses: Cervical Brace Cervical Brace: Hard collar Restrictions Weight Bearing Restrictions: Yes RLE Weight Bearing: Weight bearing as tolerated LLE Weight Bearing: Weight bearing as tolerated Other Position/Activity Restrictions: per orders, ok to donn collar edge of bed, amb to bathroom/shower without c collar    Pain:  Pt c/o 5/10 Lt hip pain; shower and rest  Therapy/Group: Individual Therapy  Rich Brave 05/05/2023, 8:15 AM

## 2023-05-05 NOTE — Patient Care Conference (Signed)
Inpatient RehabilitationTeam Conference and Plan of Care Update Date: 05/05/2023   Time: 11:13 AM    Patient Name: [name]      Medical Record Number: [id number]  Date of Birth: 1955/12/11 Sex: Female         Room/Bed: 4W02C/4W02C-01 Payor Info: Payor: [employer] MEDICARE / Plan: Monia P[employer] MEDICARE HMO/PPO / Product Type: *No Product type* /    Admit Date/Time:  04/22/2023 12:37 PM  Primary Diagnosis:  Acute incomplete quadriplegia [facility])  Hospital Problems: Principal Problem:   Acute incomplete quadriplegia (HCC) Active Problems:   Dysphagia   Odynophagia   Gastroesophageal reflux disease   Esophageal stricture    Expected Discharge Date: Expected Discharge Date: 05/08/23  Team Members Present: Physician leading conference: Dr. Genice Rouge Social Worker Present: Dossie Der, LCSW Nurse Present: Vedia Pereyra, RN PT Present: Bernie Covey, PT OT Present: Roney Mans, OT;Ardis Rowan, COTA PPS Coordinator present : Fae Pippin, SLP     Current Status/Progress Goal Weekly Team Focus  Bowel/Bladder   patient is continent of b/b LBM 10/28   remain continent   offer toileting q2h and PRN    Swallow/Nutrition/ Hydration               ADL's   min A LB dressing, min a toileting, CGA transfers, bathing-min A; education completed 10/28 with husband   supervision overall   ongoing education, activity tolerance, safety awareness, BADLs    Mobility   supervision- CGAoverall and approaching goal level, family education has been performed.   mod I transfers, supervision gait  d/c planning    Communication                Safety/Cognition/ Behavioral Observations               Pain   no c/o pain   maintain pain free   assess pain qshift and PRN    Skin   skin is intact   maintain skin integrity  assess skin qshift and PRN encouraging q2 turns while in bed and movement out of bed when possible      Discharge Planning:  DC plan remains home with  husband who can provide PRN-FMLA paperwork completed for him and given to pt along with her own. Family education completed and will need to order DME and follow up for DC 11/1   Team Discussion: Acute incomplete quadriplegia. Continent of bowel/bladder. Denies pain. Skin intact. Soft blood pressure. ABD binder. C-collar OOB Tolerating D3 diet. AC/HS with A1C 5.9 and no insulin ordered.  Patient on target to meet rehab goals: yes, downgrade lower body dressing with discharge date of 05/08/23  *See Care Plan and progress notes for long and short-term goals.   Revisions to Treatment Plan:  Midodrine time adjusted. Discontinue blood sugars. Monitor labs/VS Teaching Needs: Medications, safety, self care, transfer training, etc.   Current Barriers to Discharge: Decreased caregiver support  Possible Resolutions to Barriers: Family education completed  Order recommended DME     Medical Summary Current Status: continent and inconitnent of B/B- denies pain- dizzy/lightheaded- C collar OOB; orthostatic; pan from hip  Barriers to Discharge: Behavior/Mood;Incontinence;Neurogenic Bowel & Bladder;Hypotension;Medical stability;Spasticity;Uncontrolled Pain;Self-care education  Barriers to Discharge Comments: severely limited by orthostasis- limits walking- and pain from hip- also incontinence- Gastric ballooning for esophageal stricture Possible Resolutions to Barriers/Weekly Focus: increase midodrine- to 10 mg TID_ changed timing of meds- con't pain meds; needs w/c- will sotp BGs- A1c 5.9-needs Monjaro to be given from home- Needs H/H and  manual w/c- d/c 11/1   Continued Need for Acute Rehabilitation Level of Care: The patient requires daily medical management by a physician with specialized training in physical medicine and rehabilitation for the following reasons: Direction of a multidisciplinary physical rehabilitation program to maximize functional independence : Yes Medical management of patient  stability for increased activity during participation in an intensive rehabilitation regime.: Yes Analysis of laboratory values and/or radiology reports with any subsequent need for medication adjustment and/or medical intervention. : Yes   I attest that I was present, lead the team conference, and concur with the assessment and plan of the team.   Jearld Adjutant 05/05/2023, 3:39 PM

## 2023-05-05 NOTE — Progress Notes (Addendum)
Physical Therapy Session Note  Patient Details  Name: [name] MRN: [id number] Date of Birth: [birth year]  Today's Date: 05/05/2023 PT Individual Time: 0945-1100, 1400-1500 PT Individual Time Calculation (min): 75 min, 60 min   Short Term Goals: Week 2:  PT Short Term Goal 1 (Week 2): pt will ambulate >50 ft with LRAD PT Short Term Goal 2 (Week 2): pt will tolerate sitting OOB >4 hours PT Short Term Goal 3 (Week 2): Pt will tolerate standing > 2 min without changes in vital signs.  Skilled Therapeutic Interventions/Progress Updates:    Session 1; pt received in bed and agreeable to therapy. Pt reports mild headache but required no intervention this session.  Pt reports feeling "wiped out" after shower this am. Per OT, pt was orthostatic throughout. BP found to be 108/54(72) supine with no compression garments. Donned ted hose to support BP and pt agreeable to bed level exercise.  Pt performed the following exercises to promote LE strength and endurance: Ankle pumps for circulation alternated with other exercises, x 20 per bout Heel slides 4 x 10 w/ RLE and x 20 with LLE-pt demoes incr RLE ROM vs previous attempts SLR with 3 lb ankle weight on LLE, 3 x 10 BLE  Pt used BSC with modified Stand pivot transfer with UE support on armrests. Binder in place, but pt reports 5/10 light headedness. Bed mobility with supervision and increased time with bed features. Continent bladder void, documented in flowsheets.   Pt remained in bed in care of NT for bladder scan.    Session 2: pt received in bed and agreeable to therapy. Pt reports pain improved after sitting outside this session, unrated. Reports continued fatigue and malaise from morning orthostatics.   Stand pivot transfer with min A using arm rest for UE support. Continent bladder void. Replaced BSC with w/c with pt in standing. Increased time for bed mobility and transfers.   Pt propelled w/c to [facility] entrance for endurance and  functional mobility. Pt benefited from outdoor environment with positive effect on mood and reported pain. Pt with questions about prognosis, expected brace use, PRAFOs, follow up appointments. Answered questions as able. Also discussed safety at home and discharge plans.   Pt returned to room and to bed in same manner as above, was left with all needs in reach and alarm active. .    Therapy Documentation Precautions:  Precautions Precautions: Cervical, Fall Precaution Comments: verbally reviewed brace wear Required Braces or Orthoses: Cervical Brace Cervical Brace: Hard collar Restrictions Weight Bearing Restrictions: Yes RLE Weight Bearing: Weight bearing as tolerated LLE Weight Bearing: Weight bearing as tolerated Other Position/Activity Restrictions: per orders, ok to donn collar edge of bed, amb to bathroom/shower without c collar General:       Therapy/Group: Individual Therapy  Juluis Rainier 05/05/2023, 10:07 AM

## 2023-05-06 ENCOUNTER — Encounter: Payer: Self-pay | Admitting: Gastroenterology

## 2023-05-06 LAB — GLUCOSE, CAPILLARY
Glucose-Capillary: 122 mg/dL — ABNORMAL HIGH (ref 70–99)
Glucose-Capillary: 140 mg/dL — ABNORMAL HIGH (ref 70–99)
Glucose-Capillary: 151 mg/dL — ABNORMAL HIGH (ref 70–99)
Glucose-Capillary: 132 mg/dL — ABNORMAL HIGH (ref 70–99)

## 2023-05-06 MED ORDER — AMANTADINE HCL 100 MG PO CAPS
100.0000 mg | ORAL_CAPSULE | Freq: Every day | ORAL | Status: DC
  Administered 2023-05-07 – 2023-05-09 (×3): 100 mg via ORAL
  Filled 2023-05-06 (×3): qty 1

## 2023-05-06 MED ORDER — SENNA 8.6 MG PO TABS
1.0000 | ORAL_TABLET | Freq: Every day | ORAL | Status: DC
  Administered 2023-05-06 – 2023-05-11 (×6): 8.6 mg via ORAL
  Filled 2023-05-06 (×6): qty 1

## 2023-05-06 MED ORDER — DOCUSATE SODIUM 100 MG PO CAPS
100.0000 mg | ORAL_CAPSULE | Freq: Every day | ORAL | Status: DC
  Administered 2023-05-06 – 2023-05-11 (×6): 100 mg via ORAL
  Filled 2023-05-06 (×6): qty 1

## 2023-05-06 MED ORDER — GABAPENTIN 100 MG PO CAPS
100.0000 mg | ORAL_CAPSULE | Freq: Two times a day (BID) | ORAL | Status: DC
  Administered 2023-05-06 – 2023-05-12 (×12): 100 mg via ORAL
  Filled 2023-05-06 (×12): qty 1

## 2023-05-06 MED ORDER — CALCIUM CARBONATE 1250 (500 CA) MG PO TABS
1.0000 | ORAL_TABLET | Freq: Every day | ORAL | Status: DC
  Administered 2023-05-07 – 2023-05-12 (×6): 1250 mg via ORAL
  Filled 2023-05-06 (×6): qty 1

## 2023-05-06 MED ORDER — ADULT MULTIVITAMIN W/MINERALS CH
1.0000 | ORAL_TABLET | Freq: Every day | ORAL | Status: DC
  Administered 2023-05-07 – 2023-05-12 (×6): 1 via ORAL
  Filled 2023-05-06 (×6): qty 1

## 2023-05-06 MED ORDER — PHENOL 1.4 % MT LIQD: 1.0000 | OROMUCOSAL | Status: DC | PRN

## 2023-05-06 MED ORDER — DIPHENHYDRAMINE HCL 25 MG PO CAPS: 25.0000 mg | ORAL_CAPSULE | Freq: Four times a day (QID) | ORAL | Status: DC | PRN

## 2023-05-06 NOTE — Progress Notes (Signed)
Patient ID: [name], female   DOB: 03-13-1956, 67 y.o.   MRN: [id number] Referral made to [employer] for wheelchair and bedside commode pt to check on if Mom had a rolling walker. Husband to let her know. Aware both wheelchair and rolling walker will not be covered by insurance.

## 2023-05-06 NOTE — Progress Notes (Signed)
Physical Therapy Session Note  Patient Details  Name: [name] MRN: [id number] Date of Birth: 01-28-1956  Today's Date: 05/06/2023 PT Individual Time: 0835-0859 PT Individual Time Calculation (min): 24 min   Short Term Goals: Week 2:  PT Short Term Goal 1 (Week 2): pt will ambulate >50 ft with LRAD PT Short Term Goal 2 (Week 2): pt will tolerate sitting OOB >4 hours PT Short Term Goal 3 (Week 2): Pt will tolerate standing > 2 min without changes in vital signs.  Skilled Therapeutic Interventions/Progress Updates:      Pt seated in recliner upon arrival. Pt agreeable to therapy. Pt denies any pain at ret, and reports 6/10 pain with seated therex, premedicated. Therapist provided rest breaks and repositioning as needed.   Donner cervical brace with total A.   Seated BP 97/62 HR 82, after therex BP 100/67 HR 86   Pt performed the following therex for B LE strengthening and hemodynamic stability; verbal cues provided for technique.   1x10 LAQ B  1x10 seated alternating marching 1x10 B  1x10 ankle pumps B  1x10 glute sets B  1X10 active assisted SLR  1x10 long sitting hip abduction   Pt seated in recliner with all needs within reach  Therapy Documentation Precautions:  Precautions Precautions: Cervical, Fall Precaution Comments: verbally reviewed brace wear Required Braces or Orthoses: Cervical Brace Cervical Brace: Hard collar Restrictions Weight Bearing Restrictions: Yes RLE Weight Bearing: Weight bearing as tolerated LLE Weight Bearing: Weight bearing as tolerated Other Position/Activity Restrictions: per orders, ok to donn collar edge of bed, amb to bathroom/shower without c collar  Therapy/Group: Individual Therapy  [facility] Ambrose [place], [place], DPT  05/06/2023, 8:46 AM

## 2023-05-06 NOTE — Progress Notes (Signed)
PROGRESS NOTE   Subjective/Complaints:  Pt reports a little light headed this AM, but better than yesterday- Per OT dropped 30 points, but down to 94 systolic without TEDs.  Asking about meds/vitamins  Doesn't like Liquid meds- Throat scratchy per nursing- will add Throat spray.  ROS:   Pt denies SOB, abd pain, CP, N/V/C/D, and vision changes   Except for HPI  Objective:   No results found. Recent Labs    05/04/23 0750  WBC 6.3  HGB 8.5*  HCT 26.4*  PLT 313     Recent Labs    05/04/23 0750  NA 135  K 4.4  CL 105  CO2 23  GLUCOSE 222*  BUN 17  CREATININE 1.34*  CALCIUM 8.4*     Intake/Output Summary (Last 24 hours) at 05/06/2023 0814 Last data filed at 05/05/2023 1833 Gross per 24 hour  Intake 356 ml  Output --  Net 356 ml        Physical Exam: Vital Signs Blood pressure 131/66, pulse 74, temperature 97.9 F (36.6 C), resp. rate 17, height 5\' 7"  (1.702 m), weight 58.1 kg, SpO2 99%.       General: awake, alert, appropriate, sitting up in bed; with OT at bedside; NAD HENT: conjugate gaze; oropharynx moist CV: regular rate and rhythm; no JVD Pulmonary: CTA B/L; no W/R/R- good air movement GI: soft, NT, ND, (+)BS Psychiatric: appropriate- interactive Neurological: Ox3    - MAS of 1- but less than yesterday in U[facility]'s with hoffman's (+) B/L- however more ROM of arms B/L  Moving all 4 extremities in bed Skin: Protective dressing on heels been removed, no open wounds noted Neuro: Alert and oriented x 4, follows commands, cranial nerves II through XII intact, voice is a little hoarse, memory intact, fair insight Sensation intact to light touch in all 4 extremities however decreased in stocking glove type distribution below her knees-she reports this is chronic RUE: 4-/5 Deltoid, 4/5 Biceps, 4/5 Triceps, 4/5 Wrist Ext, 4/5 Grip, intrinsic hand muscles/fifth digit abduction 1 out of 5 LUE: 4/5  Deltoid, 4/5 Biceps, 4/5 Triceps, 4/5 Wrist Ext, 4/5 Grip, intrinsic hand muscles/fifth digit abduction 3 out of 5 RLE: HF 2/5, KE 3/5, ADF 1/5, APF 2-3/5 LLE: HF 4/5, KE 4/5,  ADF 4/5, APF 4/5 No ankle clonus, exam limited by sedation from anesthesia on 10/27 Musculoskeletal:  Decreased C-spine ROM Tender over right greater trochanter, pelvis Pain with ROM of right lower extremity  Assessment/Plan: 1. Functional deficits which require 3+ hours per day of interdisciplinary therapy in a comprehensive inpatient rehab setting. Physiatrist is providing close team supervision and 24 hour management of active medical problems listed below. Physiatrist and rehab team continue to assess barriers to discharge/monitor patient progress toward functional and medical goals  Care Tool:  Bathing    Body parts bathed by patient: Right arm, Left arm, Chest, Abdomen, Front perineal area, Buttocks, Right upper leg, Left upper leg, Face   Body parts bathed by helper: Right lower leg, Left lower leg     Bathing assist Assist Level: Minimal Assistance - Patient > 75%     Upper Body Dressing/Undressing Upper body dressing   What is  the patient wearing?: Pull over shirt    Upper body assist Assist Level: Supervision/Verbal cueing    Lower Body Dressing/Undressing Lower body dressing      What is the patient wearing?: Pants     Lower body assist Assist for lower body dressing: Minimal Assistance - Patient > 75%     Toileting Toileting    Toileting assist Assist for toileting: Contact Guard/Touching assist     Transfers Chair/bed transfer  Transfers assist     Chair/bed transfer assist level: Minimal Assistance - Patient > 75%     Locomotion Ambulation   Ambulation assist   Ambulation activity did not occur: Safety/medical concerns  Assist level: Minimal Assistance - Patient > 75% Assistive device: Walker-rolling Max distance: 25   Walk 10 feet activity   Assist  Walk  10 feet activity did not occur: Safety/medical concerns  Assist level: Minimal Assistance - Patient > 75% Assistive device: Walker-rolling   Walk 50 feet activity   Assist Walk 50 feet with 2 turns activity did not occur: Safety/medical concerns         Walk 150 feet activity   Assist Walk 150 feet activity did not occur: Safety/medical concerns         Walk 10 feet on uneven surface  activity   Assist Walk 10 feet on uneven surfaces activity did not occur: Safety/medical concerns         Wheelchair     Assist Is the patient using a wheelchair?: Yes Type of Wheelchair: Manual Wheelchair activity did not occur: Safety/medical concerns         Wheelchair 50 feet with 2 turns activity    Assist    Wheelchair 50 feet with 2 turns activity did not occur: Safety/medical concerns       Wheelchair 150 feet activity     Assist  Wheelchair 150 feet activity did not occur: Safety/medical concerns       Blood pressure 131/66, pulse 74, temperature 97.9 F (36.6 C), resp. rate 17, height 5\' 7"  (1.702 m), weight 58.1 kg, SpO2 99%.  Medical Problem List and Plan: 1. Functional deficits secondary to incomplete quadriplegia- ASIA D after fall with history of longstanding cervical stenosis             -patient may  shower             -ELOS/Goals: 12 to 16 days, PT/OT supervision to mod I, SLP N/A           D/c 11/1  con't CIR PT and OT  BP better with midodrine increase  Balloon dilation Sunday- can advance to soft diet today per GI note.   2.  Antithrombotics: -DVT/anticoagulation:  Pharmaceutical: Lovenox             -antiplatelet therapy: none   3. Pain Management: Tylenol, Percocet as needed             -continue gabapentin 200 mg twice daily   10/17- no complaints of pain- con't regimen   10/19-reports pain in her right arm, right hip and right foot.  Overall under control with current medications   10/20 reports pain is overall under  control, continue current regimen  10/23- is taking Oxy which can contribute to sedation- last dose 8pm night prior- changed Oxy to Tramadol 50 mg q12 hours prn  10/24- Will add Voltaren gel 2 G QID for R toe pain- doesn't look like gout on exam, however if gets worse, will change to  steroids.   10/30- pain is not an issue at this time 4. Mood/Behavior/Sleep: LCSW to evaluate and provide emotional support             -antipsychotic agents: n/a   5. Neuropsych/cognition: This patient is capable of making decisions on her own behalf.   6. Skin/Wound Care: Routine skin care checks  10/24- Nonblancahable on R heel- stage I- will make sure wearing PRAFO and d/w nursing- will get foam as well for R heel   10/30- will float heels since refuses ot wear PRAFOs.  7. Fluids/Electrolytes/Nutrition: Routine Is and Os and follow-up chemistries             --D3 diet, thin liquids             -lactose intolerant: continue Lactaid   8: Hypothyroidism with history of multinodular goiter s/p total thyroidectomy: continue Synthroid.  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  Hemoglobin A1c was 10.3 in 2014             -per DC: "Patient may not need any oral meds @ discharge home along with her Mounjaro."    10/17- pt's CBGs 115-173 in last 24 hours- will monitor  10/18- overall controlled- con't regimen  -10/ 20- 10/22 CBGs well-controlled, continue to monitor  10/23- Cbgs running well- con't regimen  10/28- 1 BG elevated in 180s- otherwise less than 150  10/29-10/30- overall controlled- needs to get Joycelyn Man brought in from home CBG (last 3)  Recent Labs    05/05/23 1641 05/05/23 2107 05/06/23 0630  GLUCAP 139* 193* 122*    11: Orthostatic hypotension: place TED hose and abd binder.  Improved after fluids given   10/17- will probably be an issue today- not drinking well  10/18-BP  running 90's to 100s systolic- if has orthostasis- will need Midodrine   10/ 20 BP controlled, continue to monitor with activity  10/21- HR 107- and BP on low side- will monitor  10/22- started midodrine for OH- will monitor  10/23- said still had some dizziness- will con't Midodrine  10/25- BP better with therapy- con't midodrine  10/28- BP still on low side, but less orthostasis   10/29- had severe Orthostasis this Am- but hadn't received her meds yet- will change to get at 6:30 in Am and 11 am and 1600 for last dose- so gets better control- also increased Midodrine to 10 mg TID  10/30- doing much better with Orthostasis- BP better than yesterday WITH teds, today without teds. On Max dose of Midodrine, though- if need be, will have to add Florinef.     05/06/2023    3:32 AM 05/05/2023    9:17 PM 05/05/2023    9:09 PM  Vitals with BMI  Systolic 131 154   Diastolic 66 61   Pulse 74 69 66    12: Normocytic anemia, chronic: follow-up CBC   13: GI prophylaxis: continue PPI   14: SCI: continue cervical collar when out of bed or transporting               -bladder scan/check PVRs    10/17- her RLE ankle DF/PF is stronger than seen yesterday - and LLE stable at 3-/5- will wait on PRAFOs. Also doesn't appear to need [facility]  10/22- got PRAFOs per therapy- heels a little red  10/23- Pt hasn't worn PRAFOs yet- on  floor in bag- will d/w Nursing 10/24- PRAFO on R and Prevalon on L  10/30- refusing to wear PRAFOs- will change order to float heels- since she just takes off.   15: Right superior and inferior pubic rami fractures: Weightbearing as tolerated   16: Right arm pain: X-ray on 10/13 negative   17: Constipation vs neurogenic bowel: Give sorbitol 60 cc now>>enema if no results   10/29- having BM's per pt 18. CKD IIIA.  Recheck labs.  Avoid nephrotoxic medications   10/17-BUN up to 1.39 from 1.28 however BUN better at 28- down from 37- will push fluids- write order and recheck in AM-  will also check CMP weekly. 10/9 creatinine/BUN stable at 1.24/30 10/21- Cr 1.21 and BUN 29- stable for her- pepsi drinker 10/22- About the same with BUN 25 and Cr 1.27 after IVFs- will wait on more IVFs 10/29 Cr 1.34- and BUN 17- drinking a LOT of pepsi 19. Neurogenic bowel?  10/17- LBM last night after fleets enema- but had been >5 days since LBM- will monitor and see if needs bowel program  10/ 20 add Senokot at bedtime  10/23- LBM yesterday 20. Neurogenic bladder?  10/17- having a lot of incontinent voids- will check bladder scans to see if due to overflow and is retaining- have ordered  10/18- 2 bladder scans- 0 and 12ml- will con't for now- but her incontinence has resolved it appears, so doing better  -10/ 20 continent, bladder scans not elevated  10/30- going well 21. Sedation  10/21- might be baclofen we started- will monitor 22. Spasticity-   10/21- started Baclofen 5 mg TID for this- might be contributing to #21- -also added [employer] PRAFO order for RLE  10/22- therapy feels issues with therapy today were due to orthostatic hypotension, not sedation from baclofen  - pt's spasticity is better with baclofen  10/23- stopped Baclofen for now since so sedated  10/28- sedation resolved 23. Orthostatic hypotension  10/22- will start Midodrine 5 mg TID-with meals for low BP in therapy today even with TEDs- also been using ACE wraps intermittently.   10/30- increased midodrine to 10 mg TID and changed times to 6:30am- 11am and 4pm- doing better, BP wise this AM- in 90s- sitting up without TEDs- so better than yesterday with TEDs.  24. Nausea/vomiting  10/23- pt had episode of vomiting this AM- has Zofran as needed for nausea- doesn't want right now- don't want her to have phenergan, because more sedating. 10/27: appears to have resolved  25. Sedation- Lethargy  10/23- will stop Oxy; decrease Gabapentin to 100 mg BID; reviewed her TSH- is 0.5- added Amantadine 100 mg daily to see if will  help her wake up/have more energy.   10/27 sedated today from anesthesia  10/30- much more awake 26. GI issues/dysphagia  10/23- per GI prior to admission, needed an endoscopy and barium swallow- if MBSS shows it, will call consult to GI  10/24- pt said doesn't think GI issues main cause of not eating- feels it's poor appetite  10/25- due to Severe esophageal stricture- less than 5mm- GI checking to see if can be dilated, etc- barium study ordered  10/27: discussed with GI and she tolerated dilation  10/28- will upgrade diet to soft D3 diet per GI- could do today and will NOT upgrade to regular-  27. Poor appetite  10/24- will add Megace for appetite. Don't want to add Periactin or Remeron because sedating- also ate <25% of tray this AM- however said would  eat banana when done with PT  10/25- place don lqiuid diet and changed meds to liquid/crushed  10/26: Megace held temporarily since can not be crushed and is not in liquid form  10/27: discussed with GI and continued liquid diet recommended  10/29- changed ot D3 diet  Eating better  10/30- changed meds back to pills- d/w pharmacy- they will make the change for [place].      I spent a total of 39   minutes on total care today- >50% coordination of care- due to  D/w nursing 3 different times about floating heels- throat spray and changing meds to pills- also spoke with pahrmacy -they will change to pills for [place].    LOS: 14 days A FACE TO FACE EVALUATION WAS PERFORMED  Zavior Thomason 05/06/2023, 8:14 AM

## 2023-05-06 NOTE — Progress Notes (Signed)
Occupational Therapy Session Note  Patient Details  Name: [name] MRN: [id number] Date of Birth: [birth year]  Today's Date: 05/06/2023 OT Individual Time: 0700-0814 OT Individual Time Calculation (min): 74 min    Short Term Goals: Week 2:  OT Short Term Goal 1 (Week 2): STG=LTG 2/2 ELOS  Skilled Therapeutic Interventions/Progress Updates:    Pt resting in bed upon arrival.  BP 122/55  Supine>sit EOB with supervision using bed rails with HOB elevated  BP 94/53  Ted Hose donned. Pt completed dressing tasks with sit<>stand from EOB using reacher to assist with threading BLE into pants. Pt required min A for donning pants. Supervision for doffing gown and donning pullover shirt.  Pt requested to use toilet and amb with RW to bathroom with CGA. Toileting with supervision. Pt returned to room and washed hands while standing at sink. Pt amb with RW to recliner. Pt remained in relcinert with all needs within reach. RN present.  Pt requires more then at reasonable amount of time to complete all tasks.   Therapy Documentation Precautions:  Precautions Precautions: Cervical, Fall Precaution Comments: verbally reviewed brace wear Required Braces or Orthoses: Cervical Brace Cervical Brace: Hard collar Restrictions Weight Bearing Restrictions: Yes RLE Weight Bearing: Weight bearing as tolerated LLE Weight Bearing: Weight bearing as tolerated Other Position/Activity Restrictions: per orders, ok to donn collar edge of bed, amb to bathroom/shower without c collar   Pain:  Pt reports RUE soreness is improving   Therapy/Group: Individual Therapy  Rich Brave 05/06/2023, 8:19 AM

## 2023-05-06 NOTE — Progress Notes (Signed)
Physical Therapy Session Note  Patient Details  Name: [name] MRN: [id number] Date of Birth: 14-Jul-1955  Today's Date: 05/06/2023 PT Individual Time: 0905-1000 and 1335-1443 PT Individual Time Calculation (min): 55 min and 68 min  Short Term Goals: Week 2:  PT Short Term Goal 1 (Week 2): pt will ambulate >50 ft with LRAD PT Short Term Goal 2 (Week 2): pt will tolerate sitting OOB >4 hours PT Short Term Goal 3 (Week 2): Pt will tolerate standing > 2 min without changes in vital signs.  Skilled Therapeutic Interventions/Progress Updates: Pt presented in recliner agreeable to therapy. Pt c/o mild unrated pain in hip/pelvis rest and reapportioning provided as needed. BP checked prior to mobility in seated position as noted below. Pt stood to ambulate to w/c and within 5 ft requested to sit d/t dizziness. Pt sat EOB and BP assessed, pt able to complete stand pivot to w/c. Pt then tranpsorted to day room and set up at Cybex Kinetron at 80cm/sec 2 x 2 min. BP assessed after exercises. Abdominal bonder placed and pt stood with OH symptoms present after BP assessment pt returned to sitting with symptoms resolving after a few minutes. PT able to propel partial halfway back to room and transported remaining distance back to room. Pt completed ambualtory transfer to bed and completed transfer to supine with supervision. Pt repositioned to comfort and left with bed alarm on, call bell within reach and needs met.    112/63(77) HR 79 start of session 92/58 (72) attempting ambulatory transfer to w/c 110/58 (74) HR 81 after Cybex Kinetron 64/43 (52) HR 81 standing    Tx2: Pt presented in bed agreeable to therapy. Pt noted to be semi sidelying to offload pressure off R hip. BP assessed prior to mobility 155/54 (84) HR 68. Participated in supine therex: ankle pumps, hip abd/add, elbow flexion/extension x 15 bilaterally then completed supine to sit at EOB with supervision, use of bed features and increased  time. After BP assessment 130/63 (88) HR 71 abdominal binder donned and pt completed LAQ x 10 bilaterally. Pt then stood for orthostatics however pt stating unable to tolerate after a few seconds and returned to sitting with symptoms resolving BP 99/58 (71) HR 86. Pt indicated need for bathroom completed stand pivot to w/c due to orthostatics and completed stand pivot with use of wall rail to toilet with pt able to perform LB clothing management with CGA (continent urinary void). Pt returned to w/c and transported to day room. BP checked prior 132/68 (88) HR 71 to ambulation and pt then ambulated ~38ft with RW and CGA. BP assessed 89/58 (69) HR 93 although pt asymptomatic. Discussed monitoring symptoms upon d/c and to do things slowly and deliberately. Pt then taken outside for therapeutic change of environment with PTA continuing discussion regarding maintaining adequate fluid intake and monitoring for symptoms. Pt verbalized understanding. Pt then transported back to room and in room completed ambulatory transfer to recliner. Pt left in recliner at end of session with call bell within reach and needs met.        Therapy Documentation Precautions:  Precautions Precautions: Cervical, Fall Precaution Comments: verbally reviewed brace wear Required Braces or Orthoses: Cervical Brace Cervical Brace: Hard collar Restrictions Weight Bearing Restrictions: Yes RLE Weight Bearing: Weight bearing as tolerated LLE Weight Bearing: Weight bearing as tolerated Other Position/Activity Restrictions: per orders, ok to donn collar edge of bed, amb to bathroom/shower without c collar General:   Vital Signs: Therapy Vitals Temp: 97.8 F (  36.6 C) Pulse Rate: 78 Resp: 16 BP: 107/60 Oxygen Therapy SpO2: 99 % O2 Device: Room Air    Therapy/Group: Individual Therapy  Jazen Spraggins 05/06/2023, 4:21 PM

## 2023-05-07 LAB — GLUCOSE, CAPILLARY
Glucose-Capillary: 112 mg/dL — ABNORMAL HIGH (ref 70–99)
Glucose-Capillary: 128 mg/dL — ABNORMAL HIGH (ref 70–99)
Glucose-Capillary: 155 mg/dL — ABNORMAL HIGH (ref 70–99)
Glucose-Capillary: 126 mg/dL — ABNORMAL HIGH (ref 70–99)

## 2023-05-07 MED ORDER — FLUDROCORTISONE ACETATE 0.1 MG PO TABS
0.1000 mg | ORAL_TABLET | Freq: Every day | ORAL | Status: DC
  Administered 2023-05-07 – 2023-05-09 (×3): 0.1 mg via ORAL
  Filled 2023-05-07 (×3): qty 1

## 2023-05-07 NOTE — Progress Notes (Addendum)
PROGRESS NOTE   Subjective/Complaints:  Pt reports little lightheaded this AM- before got TEDs placed- still very mild after TEDs.  Not on BP meds- but on Max dose of midodrine- will add Florinef.   Mother died 5 years ago today - hard day.  Poor appetite, but pushing through and eating "well".   LBM overnight.  Had HA this AM- is resolved now.     ROS:   Pt denies SOB, abd pain, CP, N/V/C/D, and vision changes   Except for HPI  Objective:   No results found. No results for input(s): "WBC", "HGB", "HCT", "PLT" in the last 72 hours.    No results for input(s): "NA", "K", "CL", "CO2", "GLUCOSE", "BUN", "CREATININE", "CALCIUM" in the last 72 hours.    Intake/Output Summary (Last 24 hours) at 05/07/2023 0817 Last data filed at 05/07/2023 0717 Gross per 24 hour  Intake 458 ml  Output --  Net 458 ml        Physical Exam: Vital Signs Blood pressure 114/64, pulse 75, temperature 97.9 F (36.6 C), resp. rate 17, height 5\' 7"  (1.702 m), weight 58.1 kg, SpO2 98%.       General: awake, alert, appropriate, sitting EOB with OT joining [place] in room; NAD HENT: conjugate gaze; oropharynx moist CV: regular rate and rhythm; no JVD Pulmonary: CTA B/L; no W/R/R- good air movement GI: soft, NT, ND, (+)BS Psychiatric: appropriate, bright affect Neurological: Ox3   - MAS of 1- but less than yesterday in Ue's with hoffman's (+) B/L- however more ROM of arms B/L - stable Moving all 4 extremities in bed Skin: Protective dressing on heels been removed, no open wounds noted Neuro: Alert and oriented x 4, follows commands, cranial nerves II through XII intact, voice is a little hoarse, memory intact, fair insight Sensation intact to light touch in all 4 extremities however decreased in stocking glove type distribution below her knees-she reports this is chronic RUE: 4-/5 Deltoid, 4/5 Biceps, 4/5 Triceps, 4/5 Wrist Ext, 4/5  Grip, intrinsic hand muscles/fifth digit abduction 1 out of 5 LUE: 4/5 Deltoid, 4/5 Biceps, 4/5 Triceps, 4/5 Wrist Ext, 4/5 Grip, intrinsic hand muscles/fifth digit abduction 3 out of 5 RLE: HF 2/5, KE 3/5, ADF 1/5, APF 2-3/5 LLE: HF 4/5, KE 4/5,  ADF 4/5, APF 4/5 No ankle clonus, exam limited by sedation from anesthesia on 10/27 Musculoskeletal:  Decreased C-spine ROM Tender over right greater trochanter, pelvis Pain with ROM of right lower extremity  Assessment/Plan: 1. Functional deficits which require 3+ hours per day of interdisciplinary therapy in a comprehensive inpatient rehab setting. Physiatrist is providing close team supervision and 24 hour management of active medical problems listed below. Physiatrist and rehab team continue to assess barriers to discharge/monitor patient progress toward functional and medical goals  Care Tool:  Bathing    Body parts bathed by patient: Right arm, Left arm, Chest, Abdomen, Front perineal area, Buttocks, Right upper leg, Left upper leg, Face, Right lower leg, Left lower leg   Body parts bathed by helper: Right lower leg, Left lower leg     Bathing assist Assist Level: Supervision/Verbal cueing     Upper Body Dressing/Undressing Upper body dressing  What is the patient wearing?: Pull over shirt    Upper body assist Assist Level: Set up assist    Lower Body Dressing/Undressing Lower body dressing      What is the patient wearing?: Pants     Lower body assist Assist for lower body dressing: Supervision/Verbal cueing     Toileting Toileting    Toileting assist Assist for toileting: Supervision/Verbal cueing     Transfers Chair/bed transfer  Transfers assist     Chair/bed transfer assist level: Minimal Assistance - Patient > 75%     Locomotion Ambulation   Ambulation assist   Ambulation activity did not occur: Safety/medical concerns  Assist level: Minimal Assistance - Patient > 75% Assistive device:  Walker-rolling Max distance: 25   Walk 10 feet activity   Assist  Walk 10 feet activity did not occur: Safety/medical concerns  Assist level: Minimal Assistance - Patient > 75% Assistive device: Walker-rolling   Walk 50 feet activity   Assist Walk 50 feet with 2 turns activity did not occur: Safety/medical concerns         Walk 150 feet activity   Assist Walk 150 feet activity did not occur: Safety/medical concerns         Walk 10 feet on uneven surface  activity   Assist Walk 10 feet on uneven surfaces activity did not occur: Safety/medical concerns         Wheelchair     Assist Is the patient using a wheelchair?: Yes Type of Wheelchair: Manual Wheelchair activity did not occur: Safety/medical concerns         Wheelchair 50 feet with 2 turns activity    Assist    Wheelchair 50 feet with 2 turns activity did not occur: Safety/medical concerns       Wheelchair 150 feet activity     Assist  Wheelchair 150 feet activity did not occur: Safety/medical concerns       Blood pressure 114/64, pulse 75, temperature 97.9 F (36.6 C), resp. rate 17, height 5\' 7"  (1.702 m), weight 58.1 kg, SpO2 98%.  Medical Problem List and Plan: 1. Functional deficits secondary to incomplete quadriplegia- ASIA D after fall with history of longstanding cervical stenosis             -patient may  shower             -ELOS/Goals: 12 to 16 days, PT/OT supervision to mod I, SLP N/A           D/c 11/1  D/c tomorrow Con't CIR PT and OT D/w OT about low BP again this AM  Added Florinef- will take a few days to kick in.   Balloon dilation Sunday- can advance to soft diet today per GI note.   2.  Antithrombotics: -DVT/anticoagulation:  Pharmaceutical: Lovenox             -antiplatelet therapy: none   3. Pain Management: Tylenol, Percocet as needed             -continue gabapentin 200 mg twice daily   10/17- no complaints of pain- con't regimen   10/19-reports  pain in her right arm, right hip and right foot.  Overall under control with current medications   10/20 reports pain is overall under control, continue current regimen  10/23- is taking Oxy which can contribute to sedation- last dose 8pm night prior- changed Oxy to Tramadol 50 mg q12 hours prn  10/24- Will add Voltaren gel 2 G QID for R toe  pain- doesn't look like gout on exam, however if gets worse, will change to steroids.   10/30- pain is not an issue at this time 4. Mood/Behavior/Sleep: LCSW to evaluate and provide emotional support             -antipsychotic agents: n/a   5. Neuropsych/cognition: This patient is capable of making decisions on her own behalf.   6. Skin/Wound Care: Routine skin care checks  10/24- Nonblancahable on R heel- stage I- will make sure wearing PRAFO and d/w nursing- will get foam as well for R heel   10/30- will float heels since refuses ot wear PRAFOs.  7. Fluids/Electrolytes/Nutrition: Routine Is and Os and follow-up chemistries             --D3 diet, thin liquids             -lactose intolerant: continue Lactaid   8: Hypothyroidism with history of multinodular goiter s/p total thyroidectomy: continue Synthroid.  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  Hemoglobin A1c was 10.3 in 2014             -per DC: "Patient may not need any oral meds @ discharge home along with her Mounjaro."    10/17- pt's CBGs 115-173 in last 24 hours- will monitor  10/18- overall controlled- con't regimen  -10/ 20- 10/22 CBGs well-controlled, continue to monitor  10/23- Cbgs running well- con't regimen  10/28- 1 BG elevated in 180s- otherwise less than 150  10/29-10/30- overall controlled- needs to get Joycelyn Man brought in from home CBG (last 3)  Recent Labs    05/06/23 1632 05/06/23 2146 05/07/23 0612  GLUCAP 151* 132* 112*    11: Orthostatic hypotension:  place TED hose and abd binder.  Improved after fluids given   10/17- will probably be an issue today- not drinking well  10/18-BP running 90's to 100s systolic- if has orthostasis- will need Midodrine   10/ 20 BP controlled, continue to monitor with activity  10/21- HR 107- and BP on low side- will monitor  10/22- started midodrine for OH- will monitor  10/23- said still had some dizziness- will con't Midodrine  10/25- BP better with therapy- con't midodrine  10/28- BP still on low side, but less orthostasis   10/29- had severe Orthostasis this Am- but hadn't received her meds yet- will change to get at 6:30 in Am and 11 am and 1600 for last dose- so gets better control- also increased Midodrine to 10 mg TID  10/30- doing much better with Orthostasis- BP better than yesterday WITH teds, today without teds. On Max dose of Midodrine, though- if need be, will have to add Florinef.   10/31- will add Florinef 0.1 mg daily- today and see if that helps BP- but con't Midodrine since BP still dropping os much.     05/07/2023    6:15 AM 05/06/2023    8:36 PM 05/06/2023    1:08 PM  Vitals with BMI  Systolic 114 171 025  Diastolic 64 64 60  Pulse 75 68 78    12: Normocytic anemia, chronic: follow-up CBC   13: GI prophylaxis: continue PPI   14: SCI: continue cervical collar when out of bed or transporting               -bladder scan/check PVRs    10/17- her RLE ankle DF/PF  is stronger than seen yesterday - and LLE stable at 3-/5- will wait on PRAFOs. Also doesn't appear to need [facility]  10/22- got PRAFOs per therapy- heels a little red  10/23- Pt hasn't worn PRAFOs yet- on floor in bag- will d/w Nursing 10/24- PRAFO on R and Prevalon on L  10/30- refusing to wear PRAFOs- will change order to float heels- since she just takes off.  10/31- educated needs to float heels or can get pressure ulcers on heels- pt voided understanding 15: Right superior and inferior pubic rami fractures: Weightbearing as  tolerated   16: Right arm pain: X-ray on 10/13 negative   17: Constipation vs neurogenic bowel: Give sorbitol 60 cc now>>enema if no results   10/29- having BM's per pt 18. CKD IIIA.  Recheck labs.  Avoid nephrotoxic medications   10/17-BUN up to 1.39 from 1.28 however BUN better at 28- down from 37- will push fluids- write order and recheck in AM- will also check CMP weekly. 10/9 creatinine/BUN stable at 1.24/30 10/21- Cr 1.21 and BUN 29- stable for her- pepsi drinker 10/22- About the same with BUN 25 and Cr 1.27 after IVFs- will wait on more IVFs 10/29 Cr 1.34- and BUN 17- drinking a LOT of pepsi 19. Neurogenic bowel?  10/17- LBM last night after fleets enema- but had been >5 days since LBM- will monitor and see if needs bowel program  10/ 20 add Senokot at bedtime  10/23- LBM yesterday 20. Neurogenic bladder?  10/17- having a lot of incontinent voids- will check bladder scans to see if due to overflow and is retaining- have ordered  10/18- 2 bladder scans- 0 and 12ml- will con't for now- but her incontinence has resolved it appears, so doing better  -10/ 20 continent, bladder scans not elevated  10/30- going well 21. Sedation  10/21- might be baclofen we started- will monitor 22. Spasticity-   10/21- started Baclofen 5 mg TID for this- might be contributing to #21- -also added [place] PRAFO order for RLE  10/22- therapy feels issues with therapy today were due to orthostatic hypotension, not sedation from baclofen  - pt's spasticity is better with baclofen  10/23- stopped Baclofen for now since so sedated  10/28- sedation resolved  10/31- Might need spasticity meds in future, but not now 23. Orthostatic hypotension  10/22- will start Midodrine 5 mg TID-with meals for low BP in therapy today even with TEDs- also been using ACE wraps intermittently.   10/30- increased midodrine to 10 mg TID and changed times to 6:30am- 11am and 4pm- doing better, BP wise this AM- in 90s- sitting up  without TEDs- so better than yesterday with TEDs.   10/31- Pt's BP was 94 systolic without TEDs and 111 with TEDs this AM- educated pt on orthostatic hypotension- how needs to get Midodrine at home before gets OOB; wear TEDs- and don't be late on meds. Will add Florrinef 0.1 mg daily as well  24. Nausea/vomiting  10/23- pt had episode of vomiting this AM- has Zofran as needed for nausea- doesn't want right now- don't want her to have phenergan, because more sedating. 10/27: appears to have resolved  25. Sedation- Lethargy  10/23- will stop Oxy; decrease Gabapentin to 100 mg BID; reviewed her TSH- is 0.5- added Amantadine 100 mg daily to see if will help her wake up/have more energy.   10/27 sedated today from anesthesia  10/30- much more awake 26. GI issues/dysphagia  10/23- per GI prior to admission, needed  an endoscopy and barium swallow- if MBSS shows it, will call consult to GI  10/24- pt said doesn't think GI issues main cause of not eating- feels it's poor appetite  10/25- due to Severe esophageal stricture- less than 5mm- GI checking to see if can be dilated, etc- barium study ordered  10/27: discussed with GI and she tolerated dilation  10/28- will upgrade diet to soft D3 diet per GI- could do today and will NOT upgrade to regular-  27. Poor appetite  10/24- will add Megace for appetite. Don't want to add Periactin or Remeron because sedating- also ate <25% of tray this AM- however said would eat banana when done with PT  10/25- place don lqiuid diet and changed meds to liquid/crushed  10/26: Megace held temporarily since can not be crushed and is not in liquid form  10/27: discussed with GI and continued liquid diet recommended  10/29- changed ot D3 diet  Eating better  10/30- changed meds back to pills- d/w pharmacy- they will make the change for [place].   10/31- said poor appetite, but eating well- in spite of it.      I spent a total of  36  minutes on total care today- >50%  coordination of care- due to  D/w OT IN ROM and outside room as well about pt's BP- since drops 20 points without TEDs- educated pt on orthostatic hypotension as well- and making med changes   Called back to room due to BP 76 /40s- will not d/c tomorrow- give Florinef time to work/titrate up if necessary. D/w team    LOS: 15 days A FACE TO FACE EVALUATION WAS PERFORMED  Charlean Carneal 05/07/2023, 8:17 AM

## 2023-05-07 NOTE — Progress Notes (Signed)
Physical Therapy Session Note  Patient Details  Name: [name] MRN: [id number] Date of Birth: 16-Dec-1955  Today's Date: 05/07/2023 PT Individual Time: 1305-1400 PT Individual Time Calculation (min): 55 min   Short Term Goals: Week 2:  PT Short Term Goal 1 (Week 2): pt will ambulate >50 ft with LRAD PT Short Term Goal 2 (Week 2): pt will tolerate sitting OOB >4 hours PT Short Term Goal 3 (Week 2): Pt will tolerate standing > 2 min without changes in vital signs.  Skilled Therapeutic Interventions/Progress Updates: Pt presented in recliner with friend present and eating lunch. Pt agreeable to therapy. Pt states mild unrated pain with no interventions required. BP assessed  in sitting 126/68 HR 69. Performed LAQ and ankle pumps x 10 bilaterally. Pt then stood with CGA and BP taken  HR 76 BP 74/48 (58) with pt mildly symptomatic. Pt returned to sitting and completed additional seated therex including shoulder flexion to 90 with 1lb dowel, chest press 1lb dowel, forward circles, and rows with yellow theraband x 10 each. Pt also performed hip abd/add, SLR, and hamstring pulls with yellow theraband x 10 bilaterally. Pt completed stand step transfer back to bed with RW and CGA. Abdominal binder was removed and pt completed sit supine with CGA and increased time/effort. Pt repositioned to comfort and left in bed at end of session with bed alarm on, call bell within reach and needs met. Pt encouraged while in bed to elevate HOB to continue working on BP regulation with pt verbalizing understanding.        Therapy Documentation Precautions:  Precautions Precautions: Cervical, Fall Precaution Comments: verbally reviewed brace wear Required Braces or Orthoses: Cervical Brace Cervical Brace: Hard collar Restrictions Weight Bearing Restrictions: Yes RLE Weight Bearing: Weight bearing as tolerated LLE Weight Bearing: Weight bearing as tolerated Other Position/Activity Restrictions: per orders, ok  to donn collar edge of bed, amb to bathroom/shower without c collar General:   Vital Signs: Therapy Vitals Temp: 97.6 F (36.4 C) Pulse Rate: 69 Resp: 16 BP: 126/68 Patient Position (if appropriate): Sitting Oxygen Therapy SpO2: 100 % O2 Device: Room Air  Therapy/Group: Individual Therapy  Chasidy Janak 05/07/2023, 2:33 PM

## 2023-05-07 NOTE — Progress Notes (Signed)
Physical Therapy Session Note  Patient Details  Name: [name] MRN: [id number] Date of Birth: Sep 29, 1955  Today's Date: 05/07/2023 PT Individual Time: 0915-1030 PT Individual Time Calculation (min): 75 min   Short Term Goals: Week 2:  PT Short Term Goal 1 (Week 2): pt will ambulate >50 ft with LRAD PT Short Term Goal 2 (Week 2): pt will tolerate sitting OOB >4 hours PT Short Term Goal 3 (Week 2): Pt will tolerate standing > 2 min without changes in vital signs.  Skilled Therapeutic Interventions/Progress Updates:    pt received in bed and agreeable to therapy. Pt reports still not feeling well. Pain largely controlled on medication.   Bed mobility with supervision, donned binder EOB. BP=99/52(68). Pt sat for >15 min, including seated LE exercise to increase BP. Pt requested to use BSC. CGA transfer using bed rail and arm rests for UE support, continent bladder void. On return to bed,  BP=104/61(73). Pt reports improved lightheaded ness at this time and agreeable to try walking to recliner. After walking about 3 ft, pt began to tremble and therapist pulled up chair to immediately sit. BP= 71/43(53). Pt reports she felt very light headed and close to passing out. Donned ace wraps in sitting. Pt was then able to ambulate rest of the way to recliner, BP=125/61 (80). Reinforced education for orthostatics, including sitting up for several minutes before standing and wearing compression.  Team made aware of vitals and MD in/out during session, plan to extend d/c to allow medications to take effect. Pt was left in recliner, was left with all needs in reach and alarm active.   Therapy Documentation Precautions:  Precautions Precautions: Cervical, Fall Precaution Comments: verbally reviewed brace wear Required Braces or Orthoses: Cervical Brace Cervical Brace: Hard collar Restrictions Weight Bearing Restrictions: Yes RLE Weight Bearing: Weight bearing as tolerated LLE Weight Bearing:  Weight bearing as tolerated Other Position/Activity Restrictions: per orders, ok to donn collar edge of bed, amb to bathroom/shower without c collar General:       Therapy/Group: Individual Therapy  Juluis Rainier 05/07/2023, 1:13 PM

## 2023-05-07 NOTE — Plan of Care (Signed)
  Problem: Consults Goal: RH SPINAL CORD INJURY PATIENT EDUCATION Description:  See Patient Education module for education specifics.  05/07/2023 1834 by Cletis Media, RN Outcome: Progressing 05/07/2023 1658 by Cletis Media, RN Outcome: Progressing   Problem: SCI BOWEL ELIMINATION Goal: RH STG MANAGE BOWEL WITH ASSISTANCE Description: STG Manage Bowel with min Assistance. 05/07/2023 1834 by Cletis Media, RN Outcome: Progressing 05/07/2023 1658 by Cletis Media, RN Outcome: Progressing Goal: RH STG SCI MANAGE BOWEL WITH MEDICATION WITH ASSISTANCE Description: STG SCI Manage bowel with medication with min assistance. 05/07/2023 1834 by Cletis Media, RN Outcome: Progressing 05/07/2023 1658 by Cletis Media, RN Outcome: Progressing Goal: RH STG SCI MANAGE BOWEL PROGRAM W/ASSIST OR AS APPROPRIATE Description: STG SCI Manage bowel program w/ min assist or as appropriate. 05/07/2023 1834 by Cletis Media, RN Outcome: Progressing 05/07/2023 1658 by Cletis Media, RN Outcome: Progressing   Problem: SCI BLADDER ELIMINATION Goal: RH STG MANAGE BLADDER WITH ASSISTANCE Description: STG Manage Bladder With mod I Assistance 05/07/2023 1834 by Cletis Media, RN Outcome: Progressing 05/07/2023 1658 by Cletis Media, RN Outcome: Progressing   Problem: RH SKIN INTEGRITY Goal: RH STG SKIN FREE OF INFECTION/BREAKDOWN Description: Skin will remain intake and be free of infection/breakdown with min assist  05/07/2023 1834 by Cletis Media, RN Outcome: Progressing 05/07/2023 1658 by Cletis Media, RN Outcome: Progressing   Problem: RH SAFETY Goal: RH STG ADHERE TO SAFETY PRECAUTIONS W/ASSISTANCE/DEVICE Description: STG Adhere to Safety Precautions With cueing Assistance/Device. 05/07/2023 1834 by Cletis Media, RN Outcome: Progressing 05/07/2023 1658 by Cletis Media, RN Outcome: Progressing   Problem: RH PAIN MANAGEMENT Goal: RH STG PAIN MANAGED AT OR BELOW PT'S PAIN  GOAL Description: Pain will be managed less than 4 with PRN medications min assist  05/07/2023 1834 by Cletis Media, RN Outcome: Progressing 05/07/2023 1658 by Cletis Media, RN Outcome: Progressing   Problem: RH KNOWLEDGE DEFICIT SCI Goal: RH STG INCREASE KNOWLEDGE OF SELF CARE AFTER SCI Description: Patient/caregiver will be able to manage medications and self care from nursing education and nursing handouts independently  05/07/2023 1834 by Cletis Media, RN Outcome: Progressing 05/07/2023 1658 by Cletis Media, RN Outcome: Progressing

## 2023-05-07 NOTE — Plan of Care (Signed)
  Problem: Consults Goal: RH SPINAL CORD INJURY PATIENT EDUCATION Description:  See Patient Education module for education specifics.  Outcome: Progressing   Problem: SCI BOWEL ELIMINATION Goal: RH STG MANAGE BOWEL WITH ASSISTANCE Description: STG Manage Bowel with min Assistance. Outcome: Progressing Goal: RH STG SCI MANAGE BOWEL WITH MEDICATION WITH ASSISTANCE Description: STG SCI Manage bowel with medication with min assistance. Outcome: Progressing Goal: RH STG SCI MANAGE BOWEL PROGRAM W/ASSIST OR AS APPROPRIATE Description: STG SCI Manage bowel program w/ min assist or as appropriate. Outcome: Progressing   Problem: SCI BLADDER ELIMINATION Goal: RH STG MANAGE BLADDER WITH ASSISTANCE Description: STG Manage Bladder With mod I Assistance Outcome: Progressing   Problem: RH SKIN INTEGRITY Goal: RH STG SKIN FREE OF INFECTION/BREAKDOWN Description: Skin will remain intake and be free of infection/breakdown with min assist  Outcome: Progressing   Problem: RH SAFETY Goal: RH STG ADHERE TO SAFETY PRECAUTIONS W/ASSISTANCE/DEVICE Description: STG Adhere to Safety Precautions With cueing Assistance/Device. Outcome: Progressing   Problem: RH PAIN MANAGEMENT Goal: RH STG PAIN MANAGED AT OR BELOW PT'S PAIN GOAL Description: Pain will be managed less than 4 with PRN medications min assist  Outcome: Progressing   Problem: RH KNOWLEDGE DEFICIT SCI Goal: RH STG INCREASE KNOWLEDGE OF SELF CARE AFTER SCI Description: Patient/caregiver will be able to manage medications and self care from nursing education and nursing handouts independently  Outcome: Progressing

## 2023-05-07 NOTE — Progress Notes (Signed)
Occupational Therapy Session Note  Patient Details  Name: [name] MRN: [id number] Date of Birth: [birth year]  Today's Date: 05/07/2023 OT Individual Time: 0700-0810 OT Individual Time Calculation (min): 70 min    Short Term Goals: Week 2:  OT Short Term Goal 1 (Week 2): STG=LTG 2/2 ELOS  Skilled Therapeutic Interventions/Progress Updates:    Pt resting in bed upon arrival c/o HA. OT intervention with focus on bed mobility, sitting balance, sit<>stand, standing balance, dressing tasks, and safety awareness. Supine>sit EOB with supervision. UB dressing with supervision. LB dressing with supervision using reacher. Pt requires assistance donning Ted hose and socks wihtout AE. Pt c/o lightheadedness with standing and requested to return to bed. Pt requires more then a reasonable amount of time to complete tasks.   BP: bed level with HOB elevated-94/51  Teds donned  BP: sitting EOB with Teds donned-111/61  Pt returned to bed per request.   Bed alarm activated and all needs within reach.  Therapy Documentation Precautions:  Precautions Precautions: Cervical, Fall Precaution Comments: verbally reviewed brace wear Required Braces or Orthoses: Cervical Brace Cervical Brace: Hard collar Restrictions Weight Bearing Restrictions: Yes RLE Weight Bearing: Weight bearing as tolerated LLE Weight Bearing: Weight bearing as tolerated Other Position/Activity Restrictions: per orders, ok to donn collar edge of bed, amb to bathroom/shower without c collar  Pain:  Pt c/o HA; MD aware and rest  Therapy/Group: Individual Therapy  Rich Brave 05/07/2023, 8:15 AM

## 2023-05-07 NOTE — Progress Notes (Addendum)
Patient ID: [name], female   DOB: 03/20/1956, 67 y.o.   MRN: [id number]  Per attending, pt will not d/c tomorrow due to medical concerns. D/c date is now Monday (11/4).  1026- SW spoke with pt husband to inform on above. He is aware on new discharge date.   SW provided pt with co-pay information for DME with [employer]. When discussing copay for w/c, pt reports she has an old w/c that was her  mother's as well as RW. SW will cancel w/c order. 3in1 BSC delivered to room. SW will need to follow-up about [facility] preference.   Cecile Sheerer, MSW, LCSW Office: [phone] Cell: [phone] Fax: [phone]

## 2023-05-08 LAB — GLUCOSE, CAPILLARY
Glucose-Capillary: 125 mg/dL — ABNORMAL HIGH (ref 70–99)
Glucose-Capillary: 193 mg/dL — ABNORMAL HIGH (ref 70–99)
Glucose-Capillary: 241 mg/dL — ABNORMAL HIGH (ref 70–99)
Glucose-Capillary: 314 mg/dL — ABNORMAL HIGH (ref 70–99)

## 2023-05-08 NOTE — Progress Notes (Addendum)
Patient ID: [name], female   DOB: 24-Apr-1956, 67 y.o.   MRN: [id number]  SW met with pt in room to discuss HHA preference. NO Preference.   SW sent HHPT/OT/aide referral to [facility] and waiting on follow-up.  *referral accepted. [facility] Wednesday (11/6).  Cecile Sheerer, MSW, LCSW Office: [phone] Cell: [phone] Fax: [phone]

## 2023-05-08 NOTE — Discharge Instructions (Signed)
Inpatient Rehab Discharge Instructions  Adelyn Roscher Discharge date and time: 05/11/2023   Activities/Precautions/ Functional Status: Activity: no lifting, driving, or strenuous exercise until cleared by MD Diet: diabetic diet Wound Care: none needed Functional status:  ___ No restrictions     ___ Walk up steps independently _x__ 24/7 supervision/assistance   ___ Walk up steps with assistance ___ Intermittent supervision/assistance  ___ Bathe/dress independently ___ Walk with walker     ___ Bathe/dress with assistance ___ Walk Independently    ___ Shower independently ___ Walk with assistance    __x_ Shower with assistance _x__ No alcohol     ___ Return to work/school ________  Special Instructions:  No driving, alcohol consumption or tobacco use.  Recommend checking fingerstick blood sugars four (4) times daily and record. Bring this information with you to follow-up appointment with PCP.  Recommend daily BP measurement in same arm and record time of day. Bring this information with you to follow-up appointment with PCP.   SW met with pt in room to discuss [facility] preference. NO Preference.    SW sent HHPT/OT/aide referral to [facility] and waiting on follow-up.  *referral accepted. [facility] Wednesday (11/13).   Cecile Sheerer, MSW, LCSW Office: [phone] Cell: [phone] Fax: [phone]    My questions have been answered and I understand these instructions. I will adhere to these goals and the provided educational materials after my discharge from the hospital.  Patient/Caregiver Signature _______________________________ Date __________  Clinician Signature _______________________________________ Date __________  Please bring this form and your medication list with you to all your follow-up doctor's appointments.

## 2023-05-08 NOTE — Progress Notes (Signed)
Physical Therapy Weekly Progress Note  Patient Details  Name: [name] MRN: [id number] Date of Birth: 05/28/1956  Beginning of progress report period: April 30, 2023 End of progress report period: May 08, 2023  Today's Date: 05/08/2023 PT Individual Time: 0900-1000 PT Individual Time Calculation (min): 60 min   Patient has met 2 of 3 short term goals.  Pt's ELOS was extended d/t difficulty maintaining BP with activity. Pt is progressing well with mobility, however, she is severely limited by orthostatic hypotension, even with all prophylactic measures in place. Plan to educate pt on donning ace wraps for BP management. Extensive education has been provided.   Patient continues to demonstrate the following deficits muscle weakness, decreased cardiorespiratoy endurance and hypotension, and decreased standing balance, decreased balance strategies, and difficulty maintaining precautions and therefore will continue to benefit from skilled PT intervention to increase functional independence with mobility.  Patient progressing toward long term goals..  Continue plan of care.  PT Short Term Goals Week 2:  PT Short Term Goal 1 (Week 2): pt will ambulate >50 ft with LRAD PT Short Term Goal 1 - Progress (Week 2): Met PT Short Term Goal 2 (Week 2): pt will tolerate sitting OOB >4 hours PT Short Term Goal 2 - Progress (Week 2): Met PT Short Term Goal 3 (Week 2): Pt will tolerate standing > 2 min without changes in vital signs. PT Short Term Goal 3 - Progress (Week 2): Not progressing Week 3:  PT Short Term Goal 1 (Week 3): =LTGs d/t ELOS  Skilled Therapeutic Interventions/Progress Updates:    pt received in bed and agreeable to therapy. Pt reports pain largely controlled on medication.  Pt continues to have orthostatic hypotension with some mobility. Provided extensive education on prevention and management, ie. Sitting upright several minutes before standing, LE exercise, sitting at first  sign of lightheadedness (not when things go foggy), etc.   Stand pivot transfer with UE support on armrests and bedrail for continent bladder void on BSC. Extended time in sitting for BP management.   Pt able to stand at sink for hand hygiene. Pt ambulated ~15 ft before reporting light headedness and needing to sit. BP=130/68 (86).  Pt propelled w/c to day room for endurance and functional mobility. Participated in kinetron 3 x 2 min for LE strength and BP support. Pt then ambulated ~5 ft in room to recliner, continued to reports symptoms after short bouts of standing. Encouraged pt to drink fluids. Pt was left with all needs in reach and alarm active.   Therapy Documentation Precautions:  Precautions Precautions: Cervical, Fall Precaution Comments: verbally reviewed brace wear Required Braces or Orthoses: Cervical Brace Cervical Brace: Hard collar Restrictions Weight Bearing Restrictions: No RLE Weight Bearing: Weight bearing as tolerated LLE Weight Bearing: Weight bearing as tolerated Other Position/Activity Restrictions: per orders, ok to donn collar edge of bed, amb to bathroom/shower without c collar General:      Therapy/Group: Individual Therapy  Juluis Rainier 05/08/2023, 4:04 PM

## 2023-05-08 NOTE — Progress Notes (Addendum)
PROGRESS NOTE   Subjective/Complaints:  No acute events noted overnight.  She reports she is getting try to work harder in therapy, " eating better".  She continues to have episodes of hypotension with therapy, intermittently causing lightheadedness.  Discharge was postponed to work on orthostatic hypotension.    ROS:   Pt denies fever, chills, headache, SOB, abd pain, CP, N/V/C/D, and vision changes   Except for HPI  Objective:   No results found. No results for input(s): "WBC", "HGB", "HCT", "PLT" in the last 72 hours.    No results for input(s): "NA", "K", "CL", "CO2", "GLUCOSE", "BUN", "CREATININE", "CALCIUM" in the last 72 hours.    Intake/Output Summary (Last 24 hours) at 05/08/2023 1707 Last data filed at 05/08/2023 1340 Gross per 24 hour  Intake 480 ml  Output --  Net 480 ml        Physical Exam: Vital Signs Blood pressure 113/60, pulse 68, temperature 98 F (36.7 C), temperature source Oral, resp. rate 18, height 5\' 7"  (1.702 m), weight 58.1 kg, SpO2 98%.       General: NAD HENT: conjugate gaze; oropharynx moist CV: regular rate and rhythm; no JVD Pulmonary: CTA bilaterally, nonlabored breathing GI: soft, NT, ND, (+)BS Psychiatric: appropriate, pleasant Skin: Warm and dry, no breakdown noted Neurological: Alert and awake, cranial nerves II through XII grossly intact, memory intact, fair insight Moving all 4 extremities in bed    Prior EXAM Neurological: Ox3 - MAS of 1- but less than yesterday in Ue's with hoffman's (+) B/L- however more ROM of arms B/L - stable Moving all 4 extremities in bed Skin: Protective dressing on heels been removed, no open wounds noted Neuro: Alert and oriented x 4, follows commands, cranial nerves II through XII intact, voice is a little hoarse, memory intact, fair insight Sensation intact to light touch in all 4 extremities however decreased in stocking glove  type distribution below her knees-she reports this is chronic RUE: 4-/5 Deltoid, 4/5 Biceps, 4/5 Triceps, 4/5 Wrist Ext, 4/5 Grip, intrinsic hand muscles/fifth digit abduction 1 out of 5 LUE: 4/5 Deltoid, 4/5 Biceps, 4/5 Triceps, 4/5 Wrist Ext, 4/5 Grip, intrinsic hand muscles/fifth digit abduction 3 out of 5 RLE: HF 2/5, KE 3/5, ADF 1/5, APF 2-3/5 LLE: HF 4/5, KE 4/5,  ADF 4/5, APF 4/5 No ankle clonus, exam limited by sedation from anesthesia on 10/27 Musculoskeletal:  Decreased C-spine ROM Tender over right greater trochanter, pelvis Pain with ROM of right lower extremity  Assessment/Plan: 1. Functional deficits which require 3+ hours per day of interdisciplinary therapy in a comprehensive inpatient rehab setting. Physiatrist is providing close team supervision and 24 hour management of active medical problems listed below. Physiatrist and rehab team continue to assess barriers to discharge/monitor patient progress toward functional and medical goals  Care Tool:  Bathing    Body parts bathed by patient: Right arm, Left arm, Chest, Abdomen, Front perineal area, Buttocks, Right upper leg, Left upper leg, Face, Right lower leg, Left lower leg   Body parts bathed by helper: Right lower leg, Left lower leg     Bathing assist Assist Level: Supervision/Verbal cueing     Upper Body Dressing/Undressing Upper  body dressing   What is the patient wearing?: Pull over shirt    Upper body assist Assist Level: Set up assist    Lower Body Dressing/Undressing Lower body dressing      What is the patient wearing?: Pants     Lower body assist Assist for lower body dressing: Supervision/Verbal cueing     Toileting Toileting    Toileting assist Assist for toileting: Supervision/Verbal cueing     Transfers Chair/bed transfer  Transfers assist     Chair/bed transfer assist level: Minimal Assistance - Patient > 75%     Locomotion Ambulation   Ambulation assist   Ambulation  activity did not occur: Safety/medical concerns  Assist level: Minimal Assistance - Patient > 75% Assistive device: Walker-rolling Max distance: 25   Walk 10 feet activity   Assist  Walk 10 feet activity did not occur: Safety/medical concerns  Assist level: Minimal Assistance - Patient > 75% Assistive device: Walker-rolling   Walk 50 feet activity   Assist Walk 50 feet with 2 turns activity did not occur: Safety/medical concerns         Walk 150 feet activity   Assist Walk 150 feet activity did not occur: Safety/medical concerns         Walk 10 feet on uneven surface  activity   Assist Walk 10 feet on uneven surfaces activity did not occur: Safety/medical concerns         Wheelchair     Assist Is the patient using a wheelchair?: Yes Type of Wheelchair: Manual Wheelchair activity did not occur: Safety/medical concerns         Wheelchair 50 feet with 2 turns activity    Assist    Wheelchair 50 feet with 2 turns activity did not occur: Safety/medical concerns       Wheelchair 150 feet activity     Assist  Wheelchair 150 feet activity did not occur: Safety/medical concerns       Blood pressure 113/60, pulse 68, temperature 98 F (36.7 C), temperature source Oral, resp. rate 18, height 5\' 7"  (1.702 m), weight 58.1 kg, SpO2 98%.  Medical Problem List and Plan: 1. Functional deficits secondary to incomplete quadriplegia- ASIA D after fall with history of longstanding cervical stenosis             -patient may  shower             -ELOS/Goals: 12 to 16 days, PT/OT supervision to mod I, SLP N/A           D/c 11/1  D/c tomorrow Con't CIR PT and OT D/w OT about low BP again this AM  Added Florinef- will take a few days to kick in.   Balloon dilation Sunday- can advance to soft diet today per GI note.   -Expected discharge 11/4  2.  Antithrombotics: -DVT/anticoagulation:  Pharmaceutical: Lovenox             -antiplatelet therapy:  none   3. Pain Management: Tylenol, Percocet as needed             -continue gabapentin 200 mg twice daily   10/17- no complaints of pain- con't regimen   10/19-reports pain in her right arm, right hip and right foot.  Overall under control with current medications   10/20 reports pain is overall under control, continue current regimen  10/23- is taking Oxy which can contribute to sedation- last dose 8pm night prior- changed Oxy to Tramadol 50 mg q12 hours prn  10/24- Will add Voltaren gel 2 G QID for R toe pain- doesn't look like gout on exam, however if gets worse, will change to steroids.   10/30- pain is not an issue at this time 4. Mood/Behavior/Sleep: LCSW to evaluate and provide emotional support             -antipsychotic agents: n/a   5. Neuropsych/cognition: This patient is capable of making decisions on her own behalf.   6. Skin/Wound Care: Routine skin care checks  10/24- Nonblancahable on R heel- stage I- will make sure wearing PRAFO and d/w nursing- will get foam as well for R heel   10/30- will float heels since refuses ot wear PRAFOs.  7. Fluids/Electrolytes/Nutrition: Routine Is and Os and follow-up chemistries             --D3 diet, thin liquids             -lactose intolerant: continue Lactaid   8: Hypothyroidism with history of multinodular goiter s/p total thyroidectomy: continue Synthroid.  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  Hemoglobin A1c was 10.3 in 2014             -per DC: "Patient may not need any oral meds @ discharge home along with her Mounjaro."    10/17- pt's CBGs 115-173 in last 24 hours- will monitor  10/18- overall controlled- con't regimen  -10/ 20- 10/22 CBGs well-controlled, continue to monitor  10/23- Cbgs running well- con't regimen  10/28- 1 BG elevated in 180s- otherwise less than 150  10/29-10/30- overall controlled-  needs to get Joycelyn Man brought in from  Home 11/1 CBG 241 this afternoon although CBGs appear controlled prior to this, would benefit form restart monjaro,could consider starting correctional insulin tomorrow am for short term is still not at goal CBG (last 3)  Recent Labs    05/08/23 0630 05/08/23 1211 05/08/23 1613  GLUCAP 125* 193* 241*    11: Orthostatic hypotension: place TED hose and abd binder.  Improved after fluids given   10/17- will probably be an issue today- not drinking well  10/18-BP running 90's to 100s systolic- if has orthostasis- will need Midodrine   10/ 20 BP controlled, continue to monitor with activity  10/21- HR 107- and BP on low side- will monitor  10/22- started midodrine for OH- will monitor  10/23- said still had some dizziness- will con't Midodrine  10/25- BP better with therapy- con't midodrine  10/28- BP still on low side, but less orthostasis   10/29- had severe Orthostasis this Am- but hadn't received her meds yet- will change to get at 6:30 in Am and 11 am and 1600 for last dose- so gets better control- also increased Midodrine to 10 mg TID  10/30- doing much better with Orthostasis- BP better than yesterday WITH teds, today without teds. On Max dose of Midodrine, though- if need be, will have to add Florinef.   10/31- will add Florinef 0.1 mg daily- today and see if that helps BP- but con't Midodrine since BP still dropping os much.   11/1 monitor response to medication change, Florinef may take a week or more for full effect    05/08/2023   12:56 PM 05/08/2023    2:52 AM 05/07/2023    7:46 PM  Vitals with BMI  Systolic 113 148 960  Diastolic 60 72 64  Pulse 68 80 62    12: Normocytic anemia, chronic: follow-up CBC   13: GI prophylaxis: continue PPI   14: SCI: continue cervical collar when out of bed or transporting               -bladder scan/check PVRs    10/17- her RLE ankle DF/PF is stronger than seen yesterday - and LLE stable at 3-/5-  will wait on PRAFOs. Also doesn't appear to need [facility]  10/22- got PRAFOs per therapy- heels a little red  10/23- Pt hasn't worn PRAFOs yet- on floor in bag- will d/w Nursing 10/24- PRAFO on R and Prevalon on L  10/30- refusing to wear PRAFOs- will change order to float heels- since she just takes off.  10/31- educated needs to float heels or can get pressure ulcers on heels- pt voided understanding 15: Right superior and inferior pubic rami fractures: Weightbearing as tolerated   16: Right arm pain: X-ray on 10/13 negative   17: Constipation vs neurogenic bowel: Give sorbitol 60 cc now>>enema if no results   10/29- having BM's per pt 18. CKD IIIA.  Recheck labs.  Avoid nephrotoxic medications   10/17-BUN up to 1.39 from 1.28 however BUN better at 28- down from 37- will push fluids- write order and recheck in AM- will also check CMP weekly. 10/9 creatinine/BUN stable at 1.24/30 10/21- Cr 1.21 and BUN 29- stable for her- pepsi drinker 10/22- About the same with BUN 25 and Cr 1.27 after IVFs- will wait on more IVFs 10/29 Cr 1.34- and BUN 17- drinking a LOT of pepsi 19. Neurogenic bowel?  10/17- LBM last night after fleets enema- but had been >5 days since LBM- will monitor and see if needs bowel program  10/ 20 add Senokot at bedtime  10/23- LBM yesterday 11.1 Last BM 10/30, she denies feeling constipated.  Continue to monitor bowel function 20. Neurogenic bladder?  10/17- having a lot of incontinent voids- will check bladder scans to see if due to overflow and is retaining- have ordered  10/18- 2 bladder scans- 0 and 12ml- will con't for now- but her incontinence has resolved it appears, so doing better  -10/ 20 continent, bladder scans not elevated  10/30- going well  11/1 continent bladder scans and 100 mL 21. Sedation  10/21- might be baclofen we started- will monitor 22. Spasticity-   10/21- started Baclofen 5 mg TID for this- might be contributing to #21- -also added [employer] PRAFO  order for RLE  10/22- therapy feels issues with therapy today were due to orthostatic hypotension, not sedation from baclofen  - pt's spasticity is better with baclofen  10/23- stopped Baclofen for now since so sedated  10/28- sedation resolved  10/31- Might need spasticity meds in future, but not now 23. Orthostatic hypotension  10/22- will start Midodrine 5 mg TID-with meals for low BP in therapy today even with TEDs- also been using ACE wraps intermittently.   10/30- increased midodrine to 10 mg TID and changed times to 6:30am- 11am and 4pm- doing better, BP wise this AM- in 90s- sitting up without TEDs- so better than yesterday with TEDs.   10/31- Pt's BP was 94 systolic without TEDs and 111 with TEDs this AM- educated pt on orthostatic hypotension- how needs to get Midodrine at home before gets OOB; wear TEDs- and don't be late on meds. Will add Florrinef 0.1 mg daily as well  24. Nausea/vomiting  10/23- pt had episode of vomiting this AM- has Zofran  as needed for nausea- doesn't want right now- don't want her to have phenergan, because more sedating. 10/27: appears to have resolved  25. Sedation- Lethargy  10/23- will stop Oxy; decrease Gabapentin to 100 mg BID; reviewed her TSH- is 0.5- added Amantadine 100 mg daily to see if will help her wake up/have more energy.   10/27 sedated today from anesthesia  10/30- much more awake 26. GI issues/dysphagia  10/23- per GI prior to admission, needed an endoscopy and barium swallow- if MBSS shows it, will call consult to GI  10/24- pt said doesn't think GI issues main cause of not eating- feels it's poor appetite  10/25- due to Severe esophageal stricture- less than 5mm- GI checking to see if can be dilated, etc- barium study ordered  10/27: discussed with GI and she tolerated dilation  10/28- will upgrade diet to soft D3 diet per GI- could do today and will NOT upgrade to regular-  27. Poor appetite  10/24- will add Megace for appetite. Don't  want to add Periactin or Remeron because sedating- also ate <25% of tray this AM- however said would eat banana when done with PT  10/25- place don lqiuid diet and changed meds to liquid/crushed  10/26: Megace held temporarily since can not be crushed and is not in liquid form  10/27: discussed with GI and continued liquid diet recommended  10/29- changed ot D3 diet  Eating better  10/30- changed meds back to pills- d/w pharmacy- they will make the change for [place].   10/31- said poor appetite, but eating well- in spite of it.   11/1 she ate 85 to 100% of her meals today!      LOS: 16 days A FACE TO FACE EVALUATION WAS PERFORMED  Fanny Dance 05/08/2023, 5:07 PM

## 2023-05-08 NOTE — Progress Notes (Signed)
Physical Therapy Discharge Summary  Patient Details  Name: [name] MRN: [id number] Date of Birth: 06/28/1956  Date of Discharge from PT service:{Time; dates multiple:304500300}  {CHL IP REHAB PT TIME CALCULATION:304800500}   Patient has met {NUMBERS 0-12:18577} of {NUMBERS 0-12:18577} long term goals due to {due YH:0623762}.  Patient to discharge at [facility] level {LOA:3049010}.   Patient's care partner {care partner:3041650} to provide the necessary {assistance:3041652} assistance at discharge.  Reasons goals not met: ***  Recommendation:  Patient will benefit from ongoing skilled PT services in {setting:3041680} to continue to advance safe functional mobility, address ongoing impairments in ***, and minimize fall risk.  Equipment: {equipment:3041657}  Reasons for discharge: {Reason for discharge:3049018}  Patient/family agrees with progress made and goals achieved: {Pt/Family agree with progress/goals:3049020}  PT Discharge Precautions/Restrictions Precautions Precautions: Cervical;Fall Required Braces or Orthoses: Cervical Brace Cervical Brace: Hard collar Restrictions Weight Bearing Restrictions: No RLE Weight Bearing: Weight bearing as tolerated LLE Weight Bearing: Weight bearing as tolerated Other Position/Activity Restrictions: per orders, ok to donn collar edge of bed, amb to bathroom/shower without c collar Vital Signs   Pain Pain Assessment Pain Scale: 0-10 Pain Score: 0-No pain Pain Interference Pain Interference Pain Effect on Sleep: 1. Rarely or not at all Pain Interference with Therapy Activities: 1. Rarely or not at all Pain Interference with Day-to-Day Activities: 2. Occasionally Vision/Perception  Perception Perception: Within Functional Limits  Cognition Overall Cognitive Status: Within Functional Limits for tasks assessed Arousal/Alertness: Awake/alert Orientation Level: Oriented X4 Year: 2024 Month: November Day of Week:  Correct Memory: Appears intact Awareness: Appears intact Problem Solving: Appears intact Safety/Judgment: Appears intact Sensation   Motor     Mobility   Locomotion     Trunk/Postural Assessment     Balance   Extremity Assessment      RLE Assessment RLE Assessment: Exceptions to [facility] Active Range of Motion (AROM) Comments: grossly WFLs General Strength Comments: grossly 3-/5 to 3/5 LLE Assessment LLE Assessment: Exceptions to [facility] General Strength Comments: grossly 3+/5 to 4-/5   Rayford Halsted 05/10/2023, 12:47 PM

## 2023-05-08 NOTE — Progress Notes (Signed)
Physical Therapy Session Note  Patient Details  Name: [name] MRN: [id number] Date of Birth: Oct 18, 1955  Today's Date: 05/08/2023 PT Individual Time: 1105-1200 and 1350-1445 PT Individual Time Calculation (min): 55 min and 55  Short Term Goals: Week 2:  PT Short Term Goal 1 (Week 2): pt will ambulate >50 ft with LRAD PT Short Term Goal 2 (Week 2): pt will tolerate sitting OOB >4 hours PT Short Term Goal 3 (Week 2): Pt will tolerate standing > 2 min without changes in vital signs.  Skilled Therapeutic Interventions/Progress Updates: Tx1: Pt presented in recliner agreeable to therapy. Pt c/o mild hip pain, rest and repositioning provided as needed. Pt indicated need for bathroom. Performed Sit to stand with CGA and ambulated to toilet. As pt approached toilet pt began showing symptoms of OH. Pt noted to "freeze" when instructed to sit in w/c and speech became slurred. PTA providing minA to sit pt in w/c (BP noted below). After a few min pt was able to complete stand pivot transfer to toilet and complete LB clothing management with CGA and have continent urinary void. Pt returned to w/c in same manner as prior. Pt then transported to day room for time management and performed x 3 bouts of ambulation ~62ft with RW and CGA overall. Pt mildly symptomatic after second bout of ambulation. Discussed with pt monitoring symptoms so as to not allow self to become too symptomatic. Pt voiced understanding. Pt transported back to room and completed ambulatory transfer to bed. Abdominal binder and cx collar removed and pt transferred to supine with supervision. Pt left in bed at end of session with bed alarm on, call bell within reach and needs met.    Start 113/56 (73) HR 71 Bathroom 102/55(70) HR 84 Before walking 134/59(82) HR 71 After walking 104/50(67) HR 67 Before second walk 114/52 (69) After second walk 82/47 (60) HR 87   Tx2: Pt presented in bed agreeable to therapy. Pt c/o mild pelvic pain  unrated with rest and repositioning provided as needed. Pt expressed frustration and concern regarding fluctuating BP  and current issues with OH. Discussed that currently may have to be managed more by monitoring sx and that will have to be aware of energy conservation techniques as well as current use of prophylactics. Discussed who will assist with donning ace bandages and abdominal binder with pt indicated will have SO help. Pt then requesting to use bathroom. Performed supine to sit with supervision and use of bed features. Pt noted to feel mild lightheadedness therefore completed stand pivot transfer to w/c and transported to toilet. Pt with CGA stand pivot using wall rail and CGA for LB clothing management with continent urinary void. Pt then returned to w/c in same manner as prior. Pt transported to day room and completed Cybex Kinetron 70cm/sec 1 min on 1 min off x 5 rounds. Pt then performed Sit to stand x 5. BP assessed as noted below. Pt then completed 6 bouts of 68ft with BP checked after 5ft. Pt noted to not have any s/s of OH nor significant drop in BP during activity. Pt transported back to room and completed ambulatory transfer to recliner. Pt left in recliner at end of session with call bell within reach and needs met.     Prior to walking 126/51 (72) HR 72 After walking x 2 asymptomatic 100/59 (71) HR 79 After second bout of walking 99/56 (69) HR 84 After third bout of walking 112/58 (74) HR 82  Therapy Documentation Precautions:  Precautions Precautions: Cervical, Fall Precaution Comments: verbally reviewed brace wear Required Braces or Orthoses: Cervical Brace Cervical Brace: Hard collar Restrictions Weight Bearing Restrictions: No RLE Weight Bearing: Weight bearing as tolerated LLE Weight Bearing: Weight bearing as tolerated Other Position/Activity Restrictions: per orders, ok to donn collar edge of bed, amb to bathroom/shower without c collar General:   Vital  Signs: Therapy Vitals Temp: 98 F (36.7 C) Temp Source: Oral Pulse Rate: 68 Resp: 18 BP: 113/60 Patient Position (if appropriate): Lying Oxygen Therapy SpO2: 98 % O2 Device: Room Air Pain:     Therapy/Group: Individual Therapy  Aaleeyah Bias 05/08/2023, 3:44 PM

## 2023-05-08 NOTE — Progress Notes (Signed)
Occupational Therapy Session Note  Patient Details  Name: [name] MRN: [id number] Date of Birth: 06-22-1956  Today's Date: 05/08/2023 OT Individual Time: 0702-0800 OT Individual Time Calculation (min): 58 min    Short Term Goals: Week 2:  OT Short Term Goal 1 (Week 2): STG=LTG 2/2 ELOS  Skilled Therapeutic Interventions/Progress Updates:   Pt seen for am self care session. Pt completed 100% of am meal.  OT applied TED hose and ACE wraps on B LE's in supine and BP 118/60 R UE. Supine to sit with close S. EOB for full sponge bathing, grooming and dressing with instruction with sock aide and reacher for LB pull on garment donning with CGA. Sit to stand x 4 with close S after donning abdominal binder. Stood 2 min with RW support and CGA with BP 68/52 R UE, then seated rest and sips of Gatorade. Stood 1 more interval with BP 72/56 R UE. Moved to supine with binder doffed. Foam cube squeezes and ankle pumps as well as Gatorade sips for hydration. Final reading supine with HOB all the way raised on L UE 126/72. Left pt bed level with HOB elevated and encouraged for above with bed alarm set, needs and nurse call button in reach.   Pain: 2/10 R hip with repositioning and rest  Therapy Documentation Precautions:  Precautions Precautions: Cervical, Fall Precaution Comments: verbally reviewed brace wear Required Braces or Orthoses: Cervical Brace Cervical Brace: Hard collar Restrictions Weight Bearing Restrictions: No RLE Weight Bearing: Weight bearing as tolerated LLE Weight Bearing: Weight bearing as tolerated Other Position/Activity Restrictions: per orders, ok to donn collar edge of bed, amb to bathroom/shower without c collar   Therapy/Group: Individual Therapy  Vicenta Dunning 05/08/2023, 7:05 AM

## 2023-05-09 LAB — GLUCOSE, CAPILLARY
Glucose-Capillary: 139 mg/dL — ABNORMAL HIGH (ref 70–99)
Glucose-Capillary: 208 mg/dL — ABNORMAL HIGH (ref 70–99)
Glucose-Capillary: 208 mg/dL — ABNORMAL HIGH (ref 70–99)
Glucose-Capillary: 244 mg/dL — ABNORMAL HIGH (ref 70–99)

## 2023-05-09 MED ORDER — FLUDROCORTISONE ACETATE 0.1 MG PO TABS
0.1000 mg | ORAL_TABLET | Freq: Once | ORAL | Status: AC
  Administered 2023-05-09: 0.1 mg via ORAL
  Filled 2023-05-09: qty 1

## 2023-05-09 MED ORDER — INSULIN ASPART 100 UNIT/ML IJ SOLN
0.0000 [IU] | Freq: Three times a day (TID) | INTRAMUSCULAR | Status: DC
  Administered 2023-05-09: 2 [IU] via SUBCUTANEOUS
  Administered 2023-05-09 – 2023-05-10 (×2): 3 [IU] via SUBCUTANEOUS
  Administered 2023-05-10: 2 [IU] via SUBCUTANEOUS
  Administered 2023-05-10: 1 [IU] via SUBCUTANEOUS
  Administered 2023-05-11: 2 [IU] via SUBCUTANEOUS
  Administered 2023-05-11: 7 [IU] via SUBCUTANEOUS
  Administered 2023-05-11 – 2023-05-12 (×2): 1 [IU] via SUBCUTANEOUS

## 2023-05-09 MED ORDER — FLUDROCORTISONE ACETATE 0.1 MG PO TABS
0.2000 mg | ORAL_TABLET | Freq: Every day | ORAL | Status: DC
  Administered 2023-05-10 – 2023-05-12 (×3): 0.2 mg via ORAL
  Filled 2023-05-09 (×3): qty 2

## 2023-05-09 MED ORDER — MIDODRINE HCL 5 MG PO TABS
15.0000 mg | ORAL_TABLET | Freq: Three times a day (TID) | ORAL | Status: DC
  Administered 2023-05-09 – 2023-05-12 (×9): 15 mg via ORAL
  Filled 2023-05-09 (×9): qty 3

## 2023-05-09 NOTE — Progress Notes (Signed)
Physical Therapy Session Note  Patient Details  Name: [name] MRN: [id number] Date of Birth: 11/29/1955  Today's Date: 05/09/2023 PT Individual Time: 0846-1000 PT Individual Time Calculation (min): 74 min   Short Term Goals: Week 2:  PT Short Term Goal 1 (Week 2): pt will ambulate >50 ft with LRAD PT Short Term Goal 1 - Progress (Week 2): Met PT Short Term Goal 2 (Week 2): pt will tolerate sitting OOB >4 hours PT Short Term Goal 2 - Progress (Week 2): Met PT Short Term Goal 3 (Week 2): Pt will tolerate standing > 2 min without changes in vital signs. PT Short Term Goal 3 - Progress (Week 2): Not progressing  Skilled Therapeutic Interventions/Progress Updates: Pt presents supine in bed and agreeable to therapy.  THT donned as well as ace wraps total A in supine.  Pt threaded pants over feet and pt then completed pulling up over hips w/ bridging.  Pt transferred sup to sit w/ supervision and increased time.  Pt doffed pull over shirt and donned clean w/ supervision.  Abd binder donned as well as C-collar.  Pt sat EOB w/o c/o symptoms. (See BP measurements below.)  Pt transferred sit to stand w/ CGA and stood for BP, w/o c/o symptoms.  Pt transferred to w/c w/ RW for request to use BR.  Pt wheeled to BR and transferred sit to stand w/ c/o dizziness.  Pt transferred to toilet w/ supervision and cues.  Pt continent of bladder, noted in Flowsheets and able to manage clothing w/ superision and cues for safety.  Pt wheeled to sink for hand washing, and placing dentures.  Pt amb x 15' w/ RW and CGA to recliner w/o c/o symptoms.  Pt amb x 30' including turn to return to recliner w/ CGA and no c/o symptoms.  Pt remained sitting in recliner w/ seat alarm on and all needs in reach.    BP measurements: Supine (109/61) MAP 77 Sitting 111/57 Standing 71/44 MAP 53 still without sx. After sitting down 122/63 MAP 81 After 15' gait 112/56 MAP 70  After 30' gait 90/49 MAP 63 still without sx.  MD notified  of BP measurements per request.      Therapy Documentation Precautions:  Precautions Precautions: Cervical, Fall Precaution Comments: verbally reviewed brace wear Required Braces or Orthoses: Cervical Brace Cervical Brace: Hard collar Restrictions Weight Bearing Restrictions: No RLE Weight Bearing: Weight bearing as tolerated LLE Weight Bearing: Weight bearing as tolerated Other Position/Activity Restrictions: per orders, ok to donn collar edge of bed, amb to bathroom/shower without c collar General:   Vital Signs:   Pain:5/10       Therapy/Group: Individual Therapy  Lucio Edward 05/09/2023, 12:16 PM

## 2023-05-09 NOTE — Progress Notes (Signed)
PROGRESS NOTE   Subjective/Complaints:  Pt reports had some dizziness again yesterday in spite of increase /addition of florinef.   Today with therapy- dropped to 70s/40s-  Denies major Sx's, but still dropping significantly- wearing TEDs to get up- wants to ge tin bedside chair as much as possible today, but if in too long, hurts R hip.   LBM 2 days ago, but ready to have one this AM.  Had BG of 300 last night- had pepsi and drinking gatorade- but not sugar free!   ROS:   Pt denies SOB, abd pain, CP, N/V/C/D, and vision changes   Except for HPI  Objective:   No results found. No results for input(s): "WBC", "HGB", "HCT", "PLT" in the last 72 hours.    No results for input(s): "NA", "K", "CL", "CO2", "GLUCOSE", "BUN", "CREATININE", "CALCIUM" in the last 72 hours.    Intake/Output Summary (Last 24 hours) at 05/09/2023 1011 Last data filed at 05/09/2023 0735 Gross per 24 hour  Intake 960 ml  Output --  Net 960 ml        Physical Exam: Vital Signs Blood pressure (!) 148/60, pulse 79, temperature 97.9 F (36.6 C), resp. rate 15, height 5\' 7"  (1.702 m), weight 58.1 kg, SpO2 99%.      General: awake, alert, appropriate, sitting up ~ 45 degrees in bed; NAD HENT: conjugate gaze; oropharynx moist CV: regular rate and rhythm; no JVD Pulmonary: CTA B/L; no W/R/R- good air movement GI: soft, NT, ND, (+)BS- slightly hypoactive  Psychiatric: appropriate- more interactive Neurological: Ox3   Prior EXAM Neurological: Ox3 - MAS of 1- but less than yesterday in Ue's with hoffman's (+) B/L- however more ROM of arms B/L - stable Moving all 4 extremities in bed Skin: Protective dressing on heels been removed, no open wounds noted Neuro: Alert and oriented x 4, follows commands, cranial nerves II through XII intact, voice is a little hoarse, memory intact, fair insight Sensation intact to light touch in all 4  extremities however decreased in stocking glove type distribution below her knees-she reports this is chronic RUE: 4-/5 Deltoid, 4/5 Biceps, 4/5 Triceps, 4/5 Wrist Ext, 4/5 Grip, intrinsic hand muscles/fifth digit abduction 1 out of 5 LUE: 4/5 Deltoid, 4/5 Biceps, 4/5 Triceps, 4/5 Wrist Ext, 4/5 Grip, intrinsic hand muscles/fifth digit abduction 3 out of 5 RLE: HF 2/5, KE 3/5, ADF 1/5, APF 2-3/5 LLE: HF 4/5, KE 4/5,  ADF 4/5, APF 4/5 No ankle clonus, exam limited by sedation from anesthesia on 10/27 Musculoskeletal:  Decreased C-spine ROM Tender over right greater trochanter, pelvis Pain with ROM of right lower extremity  Assessment/Plan: 1. Functional deficits which require 3+ hours per day of interdisciplinary therapy in a comprehensive inpatient rehab setting. Physiatrist is providing close team supervision and 24 hour management of active medical problems listed below. Physiatrist and rehab team continue to assess barriers to discharge/monitor patient progress toward functional and medical goals  Care Tool:  Bathing    Body parts bathed by patient: Right arm, Left arm, Chest, Abdomen, Front perineal area, Buttocks, Right upper leg, Left upper leg, Face, Right lower leg, Left lower leg   Body parts bathed by helper:  Right lower leg, Left lower leg     Bathing assist Assist Level: Supervision/Verbal cueing     Upper Body Dressing/Undressing Upper body dressing   What is the patient wearing?: Pull over shirt    Upper body assist Assist Level: Set up assist    Lower Body Dressing/Undressing Lower body dressing      What is the patient wearing?: Pants     Lower body assist Assist for lower body dressing: Supervision/Verbal cueing     Toileting Toileting    Toileting assist Assist for toileting: Supervision/Verbal cueing     Transfers Chair/bed transfer  Transfers assist     Chair/bed transfer assist level: Minimal Assistance - Patient > 75%      Locomotion Ambulation   Ambulation assist   Ambulation activity did not occur: Safety/medical concerns  Assist level: Contact Guard/Touching assist Assistive device: Walker-rolling Max distance: 30   Walk 10 feet activity   Assist  Walk 10 feet activity did not occur: Safety/medical concerns  Assist level: Contact Guard/Touching assist Assistive device: Walker-rolling   Walk 50 feet activity   Assist Walk 50 feet with 2 turns activity did not occur: Safety/medical concerns         Walk 150 feet activity   Assist Walk 150 feet activity did not occur: Safety/medical concerns         Walk 10 feet on uneven surface  activity   Assist Walk 10 feet on uneven surfaces activity did not occur: Safety/medical concerns         Wheelchair     Assist Is the patient using a wheelchair?: Yes Type of Wheelchair: Manual Wheelchair activity did not occur: Safety/medical concerns         Wheelchair 50 feet with 2 turns activity    Assist    Wheelchair 50 feet with 2 turns activity did not occur: Safety/medical concerns       Wheelchair 150 feet activity     Assist  Wheelchair 150 feet activity did not occur: Safety/medical concerns       Blood pressure (!) 148/60, pulse 79, temperature 97.9 F (36.6 C), resp. rate 15, height 5\' 7"  (1.702 m), weight 58.1 kg, SpO2 99%.  Medical Problem List and Plan: 1. Functional deficits secondary to incomplete quadriplegia- ASIA D after fall with history of longstanding cervical stenosis             -patient may  shower             -ELOS/Goals: 12 to 16 days, PT/OT supervision to mod I, SLP N/A D/w OT about low BP again this AM  Added Florinef- will take a few days to kick in.   Balloon dilation last  Sunday- can advance to soft diet today per GI note.   -Expected discharge 11/4  Asked therapy to see if could be seen tomorrow- since increased BP meds  Con't CIR PT and OT- asked nursing to get up as much  as possible today to work on seeing if BP better 2.  Antithrombotics: -DVT/anticoagulation:  Pharmaceutical: Lovenox             -antiplatelet therapy: none   3. Pain Management: Tylenol, Percocet as needed             -continue gabapentin 200 mg twice daily   10/17- no complaints of pain- con't regimen   10/19-reports pain in her right arm, right hip and right foot.  Overall under control with current medications   10/20  reports pain is overall under control, continue current regimen  10/23- is taking Oxy which can contribute to sedation- last dose 8pm night prior- changed Oxy to Tramadol 50 mg q12 hours prn  10/24- Will add Voltaren gel 2 G QID for R toe pain- doesn't look like gout on exam, however if gets worse, will change to steroids.   10/30- pain is not an issue at this time  11/2- pain adds up when sits too long in bedside chair.  4. Mood/Behavior/Sleep: LCSW to evaluate and provide emotional support             -antipsychotic agents: n/a   5. Neuropsych/cognition: This patient is capable of making decisions on her own behalf.   6. Skin/Wound Care: Routine skin care checks  10/24- Nonblancahable on R heel- stage I- will make sure wearing PRAFO and d/w nursing- will get foam as well for R heel   10/30- will float heels since refuses ot wear PRAFOs.  7. Fluids/Electrolytes/Nutrition: Routine Is and Os and follow-up chemistries             --D3 diet, thin liquids             -lactose intolerant: continue Lactaid   8: Hypothyroidism with history of multinodular goiter s/p total thyroidectomy: continue Synthroid.  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  Hemoglobin A1c was 10.3 in 2014             -per DC: "Patient may not need any oral meds @ discharge home along with her Mounjaro."    10/17- pt's CBGs 115-173 in last 24 hours- will monitor  10/18- overall  controlled- con't regimen  -10/ 20- 10/22 CBGs well-controlled, continue to monitor  10/23- Cbgs running well- con't regimen  10/28- 1 BG elevated in 180s- otherwise less than 150  10/29-10/30- overall controlled- needs to get Joycelyn Man brought in from  Home 11/1 CBG 241 this afternoon although CBGs appear controlled prior to this, would benefit form restart monjaro,could consider starting correctional insulin tomorrow am for short term is still not at goal 11/2- Cbgs running MUCH higher since [place] hasn't been restarted- got up to 314 last night- since drank pepsi and drinking gatorade- asked her to stop gatorade and pepsi. Don't want to start Insulin except SSI- which I started due to Cbgs so high CBG (last 3)  Recent Labs    05/08/23 1613 05/08/23 2051 05/09/23 0608  GLUCAP 241* 314* 139*    11: Orthostatic hypotension: place TED hose and abd binder.  Improved after fluids given   10/17- will probably be an issue today- not drinking well  10/18-BP running 90's to 100s systolic- if has orthostasis- will need Midodrine   10/ 20 BP controlled, continue to monitor with activity  10/21- HR 107- and BP on low side- will monitor  10/22- started midodrine for OH- will monitor  10/23- said still had some dizziness- will con't Midodrine  10/25- BP better with therapy- con't midodrine  10/28- BP still on low side, but less orthostasis   10/29- had severe Orthostasis this Am- but hadn't received her meds yet- will change to get at 6:30 in Am and 11 am and 1600 for last dose- so gets better control- also increased Midodrine to 10 mg TID  10/30- doing much better with Orthostasis- BP better than yesterday WITH teds, today without teds.  On Max dose of Midodrine, though- if need be, will have to add Florinef.   10/31- will add Florinef 0.1 mg daily- today and see if that helps BP- but con't Midodrine since BP still dropping os much.   11/1 monitor response to medication change, Florinef may take a  week or more for full effect  11/2- increased midodrine to 15 mg TID and also increased florinef- since BP 70s/40s this AM and dizzy with therapy. Also stopped Amatnadine since only med that could drop BP.     05/09/2023    6:05 AM 05/08/2023    7:47 PM 05/08/2023   12:56 PM  Vitals with BMI  Systolic 148 172 409  Diastolic 60 74 60  Pulse 79 84 68    12: Normocytic anemia, chronic: follow-up CBC   13: GI prophylaxis: continue PPI   14: SCI: continue cervical collar when out of bed or transporting               -bladder scan/check PVRs    10/17- her RLE ankle DF/PF is stronger than seen yesterday - and LLE stable at 3-/5- will wait on PRAFOs. Also doesn't appear to need [facility]  10/22- got PRAFOs per therapy- heels a little red  10/23- Pt hasn't worn PRAFOs yet- on floor in bag- will d/w Nursing 10/24- PRAFO on R and Prevalon on L  10/30- refusing to wear PRAFOs- will change order to float heels- since she just takes off.  10/31- educated needs to float heels or can get pressure ulcers on heels- pt voided understanding 15: Right superior and inferior pubic rami fractures: Weightbearing as tolerated   16: Right arm pain: X-ray on 10/13 negative   17: Constipation vs neurogenic bowel: Give sorbitol 60 cc now>>enema if no results   10/29- having BM's per pt 18. CKD IIIA.  Recheck labs.  Avoid nephrotoxic medications   10/17-BUN up to 1.39 from 1.28 however BUN better at 28- down from 37- will push fluids- write order and recheck in AM- will also check CMP weekly. 10/9 creatinine/BUN stable at 1.24/30 10/21- Cr 1.21 and BUN 29- stable for her- pepsi drinker 10/22- About the same with BUN 25 and Cr 1.27 after IVFs- will wait on more IVFs 10/29 Cr 1.34- and BUN 17- drinking a LOT of pepsi 11/2- will check labs Monday 19. Neurogenic bowel?  10/17- LBM last night after fleets enema- but had been >5 days since LBM- will monitor and see if needs bowel program  10/ 20 add Senokot at  bedtime  10/23- LBM yesterday 11.1 Last BM 10/30, she denies feeling constipated.  Continue to monitor bowel function 11/2- LBM 4 days ago- however pt says feels good- if no BM by tomorrow- will give Sorbitol.  20. Neurogenic bladder?  10/17- having a lot of incontinent voids- will check bladder scans to see if due to overflow and is retaining- have ordered  10/18- 2 bladder scans- 0 and 12ml- will con't for now- but her incontinence has resolved it appears, so doing better  -10/ 20 continent, bladder scans not elevated  10/30- going well  11/1 continent bladder scans and 100 mL  11/2- Bladder scans look <100cc- so will stop 21. Sedation  10/21- might be baclofen we started- will monitor 22. Spasticity-   10/21- started Baclofen 5 mg TID for this- might be contributing to #21- -also added [employer] PRAFO order for RLE  10/22- therapy feels issues with therapy today were due to orthostatic hypotension, not sedation  from baclofen  - pt's spasticity is better with baclofen  10/23- stopped Baclofen for now since so sedated  10/28- sedation resolved  10/31- Might need spasticity meds in future, but not now 23. Orthostatic hypotension  10/22- will start Midodrine 5 mg TID-with meals for low BP in therapy today even with TEDs- also been using ACE wraps intermittently.   10/30- increased midodrine to 10 mg TID and changed times to 6:30am- 11am and 4pm- doing better, BP wise this AM- in 90s- sitting up without TEDs- so better than yesterday with TEDs.   10/31- Pt's BP was 94 systolic without TEDs and 111 with TEDs this AM- educated pt on orthostatic hypotension- how needs to get Midodrine at home before gets OOB; wear TEDs- and don't be late on meds. Will add Florrinef 0.1 mg daily as well  24. Nausea/vomiting  10/23- pt had episode of vomiting this AM- has Zofran as needed for nausea- doesn't want right now- don't want her to have phenergan, because more sedating. 10/27: appears to have  resolved  25. Sedation- Lethargy  10/23- will stop Oxy; decrease Gabapentin to 100 mg BID; reviewed her TSH- is 0.5- added Amantadine 100 mg daily to see if will help her wake up/have more energy.   10/27 sedated today from anesthesia  10/30- much more awake  11/2- will stop Amatnadine since can be adding to low BP 26. GI issues/dysphagia  10/23- per GI prior to admission, needed an endoscopy and barium swallow- if MBSS shows it, will call consult to GI  10/24- pt said doesn't think GI issues main cause of not eating- feels it's poor appetite  10/25- due to Severe esophageal stricture- less than 5mm- GI checking to see if can be dilated, etc- barium study ordered  10/27: discussed with GI and she tolerated dilation  10/28- will upgrade diet to soft D3 diet per GI- could do today and will NOT upgrade to regular-  27. Poor appetite  10/24- will add Megace for appetite. Don't want to add Periactin or Remeron because sedating- also ate <25% of tray this AM- however said would eat banana when done with PT  10/25- place don lqiuid diet and changed meds to liquid/crushed  10/26: Megace held temporarily since can not be crushed and is not in liquid form  10/27: discussed with GI and continued liquid diet recommended  10/29- changed ot D3 diet  Eating better  10/30- changed meds back to pills- d/w pharmacy- they will make the change for [place].   10/31- said poor appetite, but eating well- in spite of it.   11/1 she ate 85 to 100% of her meals today!  11/2- eating a LOT per pt.    I spent a total of  51  minutes on total care today- >50% coordination of care- due to  D/w nursing, and PT about low BP today as well as pharmacy- and also reviewed literature for increasing midodrine and florinef- also seen pt twice to d/w her goal of med changes and asked PT 2nd time to add her to schedule tomorrow if possible.      LOS: 17 days A FACE TO FACE EVALUATION WAS PERFORMED  [name] 05/09/2023,  10:11 AM

## 2023-05-10 LAB — GLUCOSE, CAPILLARY
Glucose-Capillary: 123 mg/dL — ABNORMAL HIGH (ref 70–99)
Glucose-Capillary: 238 mg/dL — ABNORMAL HIGH (ref 70–99)
Glucose-Capillary: 187 mg/dL — ABNORMAL HIGH (ref 70–99)
Glucose-Capillary: 213 mg/dL — ABNORMAL HIGH (ref 70–99)

## 2023-05-10 MED ORDER — POLYETHYLENE GLYCOL 3350 17 G PO PACK
17.0000 g | PACK | Freq: Every day | ORAL | Status: DC
  Administered 2023-05-10 – 2023-05-12 (×3): 17 g via ORAL
  Filled 2023-05-10 (×4): qty 1

## 2023-05-10 MED ORDER — SORBITOL 70 % SOLN
30.0000 mL | Freq: Once | Status: AC
  Administered 2023-05-10: 30 mL via ORAL
  Filled 2023-05-10: qty 30

## 2023-05-10 NOTE — Progress Notes (Signed)
PROGRESS NOTE   Subjective/Complaints:  Pt reports no significant dizziness/lightheadedness, however I think she's hiding it since she is so desperate to go home tomorrow.   Had a bad HA last night- not sure why- LBM 3-4 days ago- per pt.  Sore all over- not taking any pain meds except tylenol.    ROS:   Pt denies SOB, abd pain, CP, N/V/C/D, and vision changes   Except for HPI  Objective:   No results found. No results for input(s): "WBC", "HGB", "HCT", "PLT" in the last 72 hours.    No results for input(s): "NA", "K", "CL", "CO2", "GLUCOSE", "BUN", "CREATININE", "CALCIUM" in the last 72 hours.    Intake/Output Summary (Last 24 hours) at 05/10/2023 1330 Last data filed at 05/09/2023 1820 Gross per 24 hour  Intake 236 ml  Output --  Net 236 ml        Physical Exam: Vital Signs Blood pressure (!) 159/71, pulse 81, temperature 97.9 F (36.6 C), resp. rate 16, height 5\' 7"  (1.702 m), weight 58.1 kg, SpO2 98%.       General: awake, alert, appropriate, sitting up at 60+ degrees in bed; appears comfortable;  NAD HENT: conjugate gaze; oropharynx moist CV: regular rate and rhythm; no JVD Pulmonary: CTA B/L; no W/R/R- good air movement GI: soft, NT, ND, (+)BS Psychiatric: appropriate- desperate to leave tomorrow- think she's hiding some of her dizziness from me Neurological: Ox3   Prior EXAM Neurological: Ox3 - MAS of 1- but less than yesterday in Ue's with hoffman's (+) B/L- however more ROM of arms B/L - stable Moving all 4 extremities in bed Skin: Protective dressing on heels been removed, no open wounds noted Neuro: Alert and oriented x 4, follows commands, cranial nerves II through XII intact, voice is a little hoarse, memory intact, fair insight Sensation intact to light touch in all 4 extremities however decreased in stocking glove type distribution below her knees-she reports this is chronic RUE:  4-/5 Deltoid, 4/5 Biceps, 4/5 Triceps, 4/5 Wrist Ext, 4/5 Grip, intrinsic hand muscles/fifth digit abduction 1 out of 5 LUE: 4/5 Deltoid, 4/5 Biceps, 4/5 Triceps, 4/5 Wrist Ext, 4/5 Grip, intrinsic hand muscles/fifth digit abduction 3 out of 5 RLE: HF 2/5, KE 3/5, ADF 1/5, APF 2-3/5 LLE: HF 4/5, KE 4/5,  ADF 4/5, APF 4/5 No ankle clonus, exam limited by sedation from anesthesia on 10/27 Musculoskeletal:  Decreased C-spine ROM Tender over right greater trochanter, pelvis Pain with ROM of right lower extremity  Assessment/Plan: 1. Functional deficits which require 3+ hours per day of interdisciplinary therapy in a comprehensive inpatient rehab setting. Physiatrist is providing close team supervision and 24 hour management of active medical problems listed below. Physiatrist and rehab team continue to assess barriers to discharge/monitor patient progress toward functional and medical goals  Care Tool:  Bathing    Body parts bathed by patient: Right arm, Left arm, Chest, Abdomen, Front perineal area, Buttocks, Right upper leg, Left upper leg, Face, Right lower leg, Left lower leg   Body parts bathed by helper: Right lower leg, Left lower leg     Bathing assist Assist Level: Supervision/Verbal cueing     Upper Body  Dressing/Undressing Upper body dressing   What is the patient wearing?: Pull over shirt    Upper body assist Assist Level: Supervision/Verbal cueing    Lower Body Dressing/Undressing Lower body dressing      What is the patient wearing?: Pants     Lower body assist Assist for lower body dressing: Supervision/Verbal cueing     Toileting Toileting Toileting Activity did not occur (Clothing management and hygiene only): Refused  Toileting assist Assist for toileting: Supervision/Verbal cueing     Transfers Chair/bed transfer  Transfers assist     Chair/bed transfer assist level: Contact Guard/Touching assist     Locomotion Ambulation   Ambulation  assist   Ambulation activity did not occur: Safety/medical concerns  Assist level: Contact Guard/Touching assist Assistive device: Walker-rolling Max distance: 2   Walk 10 feet activity   Assist  Walk 10 feet activity did not occur: Safety/medical concerns  Assist level: Contact Guard/Touching assist Assistive device: Walker-rolling   Walk 50 feet activity   Assist Walk 50 feet with 2 turns activity did not occur: Safety/medical concerns         Walk 150 feet activity   Assist Walk 150 feet activity did not occur: Safety/medical concerns         Walk 10 feet on uneven surface  activity   Assist Walk 10 feet on uneven surfaces activity did not occur: Safety/medical concerns         Wheelchair     Assist Is the patient using a wheelchair?: Yes Type of Wheelchair: Manual Wheelchair activity did not occur: Safety/medical concerns         Wheelchair 50 feet with 2 turns activity    Assist    Wheelchair 50 feet with 2 turns activity did not occur: Safety/medical concerns       Wheelchair 150 feet activity     Assist  Wheelchair 150 feet activity did not occur: Safety/medical concerns       Blood pressure (!) 159/71, pulse 81, temperature 97.9 F (36.6 C), resp. rate 16, height 5\' 7"  (1.702 m), weight 58.1 kg, SpO2 98%.  Medical Problem List and Plan: 1. Functional deficits secondary to incomplete quadriplegia- ASIA D after fall with history of longstanding cervical stenosis             -patient may  shower             -ELOS/Goals: 12 to 16 days, PT/OT supervision to mod I, SLP N/A D/w OT about low BP again this AM  Added Florinef- will take a few days to kick in.   Balloon dilation last  Sunday- can advance to soft diet today per GI note.   To d/c 11/4- however need to f/u on labs before d/c- needs to see me in f/u- as well as PCP and NSU.   -doesn't need pain meds- not taking  Stopped amantadine and increased midodrine to 15 mg  TID- be clear with pt takes 4-5 hours and midodrine wears off  Also increased florinef- early to do so, however BP so low, so had to change meds 2.  Antithrombotics: -DVT/anticoagulation:  Pharmaceutical: Lovenox             -antiplatelet therapy: none   3. Pain Management: Tylenol, Percocet as needed             -continue gabapentin 200 mg twice daily   10/17- no complaints of pain- con't regimen   10/19-reports pain in her right arm, right hip and  right foot.  Overall under control with current medications   10/20 reports pain is overall under control, continue current regimen  10/23- is taking Oxy which can contribute to sedation- last dose 8pm night prior- changed Oxy to Tramadol 50 mg q12 hours prn  10/24- Will add Voltaren gel 2 G QID for R toe pain- doesn't look like gout on exam, however if gets worse, will change to steroids.   10/30- pain is not an issue at this time  11/2- pain adds up when sits too long in bedside chair 11/3- not taking tramadol or oxy- since 10/24- so no need to send home on pain meds.  4. Mood/Behavior/Sleep: LCSW to evaluate and provide emotional support             -antipsychotic agents: n/a   5. Neuropsych/cognition: This patient is capable of making decisions on her own behalf.   6. Skin/Wound Care: Routine skin care checks  10/24- Nonblancahable on R heel- stage I- will make sure wearing PRAFO and d/w nursing- will get foam as well for R heel   10/30- will float heels since refuses ot wear PRAFOs.  7. Fluids/Electrolytes/Nutrition: Routine Is and Os and follow-up chemistries             --D3 diet, thin liquids             -lactose intolerant: continue Lactaid   8: Hypothyroidism with history of multinodular goiter s/p total thyroidectomy: continue Synthroid.  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  Hemoglobin A1c was 10.3 in 2014              -per DC: "Patient may not need any oral meds @ discharge home along with her Mounjaro."    10/17- pt's CBGs 115-173 in last 24 hours- will monitor  10/18- overall controlled- con't regimen  -10/ 20- 10/22 CBGs well-controlled, continue to monitor  10/23- Cbgs running well- con't regimen  10/28- 1 BG elevated in 180s- otherwise less than 150  10/29-10/30- overall controlled- needs to get Joycelyn Man brought in from  Home 11/1 CBG 241 this afternoon although CBGs appear controlled prior to this, would benefit form restart monjaro,could consider starting correctional insulin tomorrow am for short term is still not at goal 11/2- Cbgs running MUCH higher since [place] hasn't been restarted- got up to 314 last night- since drank pepsi and drinking gatorade- asked her to stop gatorade and pepsi. Don't want to start Insulin except SSI- which I started due to Cbgs so high 11/3- pt hasn't gotten her Mounjaro- so BG's getting higher- will take when gets home per pt- on Ssi until d/c.  CBG (last 3)  Recent Labs    05/09/23 2043 05/10/23 0610 05/10/23 1150  GLUCAP 244* 123* 238*    11: Orthostatic hypotension: place TED hose and abd binder.  Improved after fluids given   10/17- will probably be an issue today- not drinking well  10/18-BP running 90's to 100s systolic- if has orthostasis- will need Midodrine   10/ 20 BP controlled, continue to monitor with activity  10/21- HR 107- and BP on low side- will monitor  10/22- started midodrine for OH- will monitor  10/23- said still had some dizziness- will con't Midodrine  10/25- BP better with therapy- con't midodrine  10/28- BP still on low side, but less orthostasis   10/29- had severe Orthostasis  this Am- but hadn't received her meds yet- will change to get at 6:30 in Am and 11 am and 1600 for last dose- so gets better control- also increased Midodrine to 10 mg TID  10/30- doing much better with Orthostasis- BP better than yesterday WITH teds,  today without teds. On Max dose of Midodrine, though- if need be, will have to add Florinef.   10/31- will add Florinef 0.1 mg daily- today and see if that helps BP- but con't Midodrine since BP still dropping os much.   11/1 monitor response to medication change, Florinef may take a week or more for full effect  11/2- increased midodrine to 15 mg TID and also increased florinef- since BP 70s/40s this AM and dizzy with therapy. Also stopped Amatnadine since only med that could drop BP.   11/3- BP still dropping to 68 systolic without ACE wraps (had on TEDS) and 79 systolic with TEDS and ACE wraps- but up to 180s systolic when in bed at night- explained need her to drink so can bring up BP- on max dose of midodrine and have increased florinef as well- don't really have anything else that can be done except IVFs which I'm trying to avoid. If doesn't do well or gets dizzy /syncope before d/c; will need to call Cards.     05/10/2023    1:10 PM 05/10/2023    5:55 AM 05/10/2023    1:10 AM  Vitals with BMI  Systolic 159 103 161  Diastolic 71 57 76  Pulse 81 78 93    12: Normocytic anemia, chronic: follow-up CBC   13: GI prophylaxis: continue PPI   14: SCI: continue cervical collar when out of bed or transporting               -bladder scan/check PVRs    10/17- her RLE ankle DF/PF is stronger than seen yesterday - and LLE stable at 3-/5- will wait on PRAFOs. Also doesn't appear to need [facility]  10/22- got PRAFOs per therapy- heels a little red  10/23- Pt hasn't worn PRAFOs yet- on floor in bag- will d/w Nursing 10/24- PRAFO on R and Prevalon on L  10/30- refusing to wear PRAFOs- will change order to float heels- since she just takes off.  10/31- educated needs to float heels or can get pressure ulcers on heels- pt voided understanding 15: Right superior and inferior pubic rami fractures: Weightbearing as tolerated   16: Right arm pain: X-ray on 10/13 negative   17: Constipation vs neurogenic  bowel: Give sorbitol 60 cc now>>enema if no results   10/29- having BM's per pt 18. CKD IIIA.  Recheck labs.  Avoid nephrotoxic medications   10/17-BUN up to 1.39 from 1.28 however BUN better at 28- down from 37- will push fluids- write order and recheck in AM- will also check CMP weekly. 10/9 creatinine/BUN stable at 1.24/30 10/21- Cr 1.21 and BUN 29- stable for her- pepsi drinker 10/22- About the same with BUN 25 and Cr 1.27 after IVFs- will wait on more IVFs 10/29 Cr 1.34- and BUN 17- drinking a LOT of pepsi 11/2- will check labs Monday 11/3- think pt dry- and so as a result, making hypotension worse- told pt her labs in AM tell us if she can leave tomorrow 19. Neurogenic bowel?  10/17- LBM last night after fleets enema- but had been >5 days since LBM- will monitor and see if needs bowel program  10/ 20 add Senokot at bedtime  10/23-  LBM yesterday 11.1 Last BM 10/30, she denies feeling constipated.  Continue to monitor bowel function 11/2- LBM 4 days ago- however pt says feels good- if no BM by tomorrow- will give Sorbitol. 11/3- no BM- will give sorbitol and add miralax to meds  20. Neurogenic bladder?  10/17- having a lot of incontinent voids- will check bladder scans to see if due to overflow and is retaining- have ordered  10/18- 2 bladder scans- 0 and 12ml- will con't for now- but her incontinence has resolved it appears, so doing better  -10/ 20 continent, bladder scans not elevated  10/30- going well  11/1 continent bladder scans and 100 mL  11/2- Bladder scans look <100cc- so will stop 21. Sedation  10/21- might be baclofen we started- will monitor 22. Spasticity-   10/21- started Baclofen 5 mg TID for this- might be contributing to #21- -also added [employer] PRAFO order for RLE  10/22- therapy feels issues with therapy today were due to orthostatic hypotension, not sedation from baclofen  - pt's spasticity is better with baclofen  10/23- stopped Baclofen for now since so  sedated  10/28- sedation resolved  10/31- Might need spasticity meds in future, but not now 23. Orthostatic hypotension  10/22- will start Midodrine 5 mg TID-with meals for low BP in therapy today even with TEDs- also been using ACE wraps intermittently.   10/30- increased midodrine to 10 mg TID and changed times to 6:30am- 11am and 4pm- doing better, BP wise this AM- in 90s- sitting up without TEDs- so better than yesterday with TEDs.   10/31- Pt's BP was 94 systolic without TEDs and 111 with TEDs this AM- educated pt on orthostatic hypotension- how needs to get Midodrine at home before gets OOB; wear TEDs- and don't be late on meds. Will add Florrinef 0.1 mg daily as well 11/3- Increased florinef to 0.2 mg yesterday and increased midodrine to 15 mg TID yesterday- BP spiked overnight, however dropping 30+ points with standing- and more than 80 points from when laying in bed at night-I think she's dry- she's willing to drink and we will check CMP in AM before leaves- no other signs of infection.    24. Nausea/vomiting  10/23- pt had episode of vomiting this AM- has Zofran as needed for nausea- doesn't want right now- don't want her to have phenergan, because more sedating. 10/27: appears to have resolved  25. Sedation- Lethargy  10/23- will stop Oxy; decrease Gabapentin to 100 mg BID; reviewed her TSH- is 0.5- added Amantadine 100 mg daily to see if will help her wake up/have more energy.   10/27 sedated today from anesthesia  10/30- much more awake  11/2- will stop Amatnadine since can be adding to low BP  11/3- doesn't appear to be more sedated today- so will keep off 26. GI issues/dysphagia  10/23- per GI prior to admission, needed an endoscopy and barium swallow- if MBSS shows it, will call consult to GI  10/24- pt said doesn't think GI issues main cause of not eating- feels it's poor appetite  10/25- due to Severe esophageal stricture- less than 5mm- GI checking to see if can be dilated,  etc- barium study ordered  10/27: discussed with GI and she tolerated dilation  10/28- will upgrade diet to soft D3 diet per GI- could do today and will NOT upgrade to regular-  27. Poor appetite  10/24- will add Megace for appetite. Don't want to add Periactin or Remeron because sedating- also ate <  25% of tray this AM- however said would eat banana when done with PT  10/25- place don lqiuid diet and changed meds to liquid/crushed  10/26: Megace held temporarily since can not be crushed and is not in liquid form  10/27: discussed with GI and continued liquid diet recommended  10/29- changed ot D3 diet  Eating better  10/30- changed meds back to pills- d/w pharmacy- they will make the change for [place].   10/31- said poor appetite, but eating well- in spite of it.   11/1 she ate 85 to 100% of her meals today!  11/2- eating a LOT per pt.   11/3- told pt needs to drink well to leave Rehab tomorrow- so when labs checked, isn't dry   I spent a total of 53   minutes on total care today- >50% coordination of care- due to  D/w team- PT and nursing x2 about low BP- dropped to 68 systolic then placed ace wraps- dropped to 79 systolic after 5 steps with TEDS and ACE wraps- BP also was very high when laying down last night-    LOS: 18 days A FACE TO FACE EVALUATION WAS PERFORMED  Pablo Stauffer 05/10/2023, 1:30 PM

## 2023-05-10 NOTE — Plan of Care (Signed)
  Problem: Sit to Stand Goal: LTG:  Patient will perform sit to stand in prep for activites of daily living with assistance level (OT) Description: LTG:  Patient will perform sit to stand in prep for activites of daily living with assistance level (OT) Outcome: Completed/Met   Problem: RH Eating Goal: LTG Patient will perform eating w/assist, cues/equip (OT) Description: LTG: Patient will perform eating with assist, with/without cues using equipment (OT) Outcome: Completed/Met   Problem: RH Grooming Goal: LTG Patient will perform grooming w/assist,cues/equip (OT) Description: LTG: Patient will perform grooming with assist, with/without cues using equipment (OT) Outcome: Completed/Met   Problem: RH Bathing Goal: LTG Patient will bathe all body parts with assist levels (OT) Description: LTG: Patient will bathe all body parts with assist levels (OT) Outcome: Completed/Met   Problem: RH Dressing Goal: LTG Patient will perform upper body dressing (OT) Description: LTG Patient will perform upper body dressing with assist, with/without cues (OT). Outcome: Completed/Met Goal: LTG Patient will perform lower body dressing w/assist (OT) Description: LTG: Patient will perform lower body dressing with assist, with/without cues in positioning using equipment (OT) Outcome: Completed/Met   Problem: RH Toileting Goal: LTG Patient will perform toileting task (3/3 steps) with assistance level (OT) Description: LTG: Patient will perform toileting task (3/3 steps) with assistance level (OT)  Outcome: Completed/Met   Problem: RH Toilet Transfers Goal: LTG Patient will perform toilet transfers w/assist (OT) Description: LTG: Patient will perform toilet transfers with assist, with/without cues using equipment (OT) Outcome: Completed/Met   Problem: RH Tub/Shower Transfers Goal: LTG Patient will perform tub/shower transfers w/assist (OT) Description: LTG: Patient will perform tub/shower transfers with  assist, with/without cues using equipment (OT) Outcome: Completed/Met

## 2023-05-10 NOTE — Progress Notes (Signed)
Occupational Therapy Session Note  Patient Details  Name: [name] MRN: [id number] Date of Birth: 07-Sep-1955  Today's Date: 05/10/2023 OT Individual Time: 7829-5621 OT Individual Time Calculation (min): 56 min    Short Term Goals: Week 2:  OT Short Term Goal 1 (Week 2): STG=LTG 2/2 ELOS  Skilled Therapeutic Interventions/Progress Updates:  Pt received resting in bed for skilled OT session with focus on BADL participation and discharge planning. Pt agreeable to interventions, demonstrating overall pleasant mood. Pt with un-rated/intermediate pain in R-hip during bed mobility. OT offering intermediate rest breaks and positioning suggestions throughout session to address pain/fatigue and maximize participation/safety in session.   Pt dons B thigh-high teds with Min A for threading, requiring increased time to hike up leg. BP in supine=135/67  Pt threads/dons sweat-pants at bed-level, stating "that way I dont have to bend over", bridging to hike over bottom/hips. Using figure-4 technique, pt dons socks at bed-level. Pt comes to EOB with increased time + supervision. Pt able to teach-back use of AE including reacher/sock-aid for EOB LB dressing. Pt dons t-shirt with setup/supervision. BP in sitting for ~5 mins without abdominal binder=123/64. Pt performs STS transfers with close supervision + RW, ambulating into bathroom with Min cuing for managing RW at a safe distance, pt continent of urine, completing 3/3 toileting activities with same level of assistance. BP post-activity with abdominal binder=99/57.   Sitting at sink-side, pt completes oral care and face washing with distance supervision/Mod I. Pt ambulates back to recliner, BP sitting in recliner after ~3-4 mins=128/71. Pt shares husband is aware and will be able to perform figure-8 wrapping as needed for BP management at home.   Pt remained sitting in recliner with all immediate needs met at end of session. Pt continues to be appropriate  for skilled OT intervention to promote further functional independence.   Therapy Documentation Precautions:  Precautions Precautions: Cervical, Fall Precaution Comments: verbally reviewed brace wear Required Braces or Orthoses: Cervical Brace Cervical Brace: Hard collar Restrictions Weight Bearing Restrictions: No RLE Weight Bearing: Weight bearing as tolerated LLE Weight Bearing: Weight bearing as tolerated Other Position/Activity Restrictions: per orders, ok to donn collar edge of bed, amb to bathroom/shower without c collar   Therapy/Group: Individual Therapy  Lou Cal, OTR/L, MSOT  05/10/2023, 9:47 AM

## 2023-05-10 NOTE — Progress Notes (Signed)
Physical Therapy Session Note  Patient Details  Name: Noah Pelaez MRN: [id number] Date of Birth: 06-13-1956  Today's Date: 05/10/2023 PT Individual Time: 1100-1200 PT Individual Time Calculation (min): 60 min   Short Term Goals: Week 1:  PT Short Term Goal 1 (Week 1): Pt will tolerate sitting upright OOB >4 hours PT Short Term Goal 1 - Progress (Week 1): Progressing toward goal PT Short Term Goal 2 (Week 1): Pt will tolerate standing more than 2 min without changes in vital signs PT Short Term Goal 2 - Progress (Week 1): Progressing toward goal PT Short Term Goal 3 (Week 1): Pt will ambulate >30 ft with LRAD PT Short Term Goal 3 - Progress (Week 1): Met  Skilled Therapeutic Interventions/Progress Updates:  Pt was seen bedside in the am. Pt sitting up in recliner. Initially check orthostatics with TED hoses in place: BP supine 140/66, HR 62, sitting 130/63, HR 76, standing 68/43, HR 91. Pt returned to recliner. Donned ace wraps. BP in sitting 135/67, HR 70, in standing 83/46, HR 88. Pt returned to supine. No complaints of symptoms as per pt. Pt transferred sit to stand, BP 86/52, stepped to bed about 2 feet, BP dropped to 79/48, returned to recliner BP 118/62. All BPs initially taken in R arm. Then attempted the left arm, BP initially 141/75, HR 76, transitioned to standing 84/50, HR 91. Pt performed all transfers with sit to stand and stand with S to contact guard and verbal cues. Pt performed bed mobility with S to contact guard. At end of treatment, pt left sitting up in recliner with BP 118/60. Pt left with all needs within reach. Discussed with Dr Berline Chough following treatment.   Therapy Documentation Precautions:  Precautions Precautions: Cervical, Fall Precaution Comments: verbally reviewed brace wear Required Braces or Orthoses: Cervical Brace Cervical Brace: Hard collar Restrictions Weight Bearing Restrictions: No RLE Weight Bearing: Weight bearing as tolerated LLE Weight  Bearing: Weight bearing as tolerated Other Position/Activity Restrictions: per orders, ok to donn collar edge of bed, amb to bathroom/shower without c collar General:   Vital Signs:   Pain: Pain Assessment Pain Scale: 0-10 Pain Score: 0-No pain  Therapy/Group: Individual Therapy  Rayford Halsted 05/10/2023, 12:33 PM

## 2023-05-11 ENCOUNTER — Other Ambulatory Visit (HOSPITAL_COMMUNITY): Payer: Self-pay

## 2023-05-11 DIAGNOSIS — N179 Acute kidney failure, unspecified: Secondary | ICD-10-CM

## 2023-05-11 DIAGNOSIS — R519 Headache, unspecified: Secondary | ICD-10-CM

## 2023-05-11 LAB — GLUCOSE, CAPILLARY
Glucose-Capillary: 145 mg/dL — ABNORMAL HIGH (ref 70–99)
Glucose-Capillary: 181 mg/dL — ABNORMAL HIGH (ref 70–99)
Glucose-Capillary: 315 mg/dL — ABNORMAL HIGH (ref 70–99)
Glucose-Capillary: 280 mg/dL — ABNORMAL HIGH (ref 70–99)

## 2023-05-11 LAB — COMPREHENSIVE METABOLIC PANEL
ALT: 17 U/L (ref 0–44)
AST: 19 U/L (ref 15–41)
Albumin: 3.1 g/dL — ABNORMAL LOW (ref 3.5–5.0)
Alkaline Phosphatase: 157 U/L — ABNORMAL HIGH (ref 38–126)
Anion gap: 11 (ref 5–15)
BUN: 26 mg/dL — ABNORMAL HIGH (ref 8–23)
CO2: 20 mmol/L — ABNORMAL LOW (ref 22–32)
Calcium: 9.1 mg/dL (ref 8.9–10.3)
Chloride: 108 mmol/L (ref 98–111)
Creatinine, Ser: 1.77 mg/dL — ABNORMAL HIGH (ref 0.44–1.00)
GFR, Estimated: 32 mL/min — ABNORMAL LOW (ref >60)
Glucose, Bld: 145 mg/dL — ABNORMAL HIGH (ref 70–99)
Potassium: 4.6 mmol/L (ref 3.5–5.1)
Sodium: 139 mmol/L (ref 135–145)
Total Bilirubin: 0.5 mg/dL (ref <1.2)
Total Protein: 6.7 g/dL (ref 6.5–8.1)

## 2023-05-11 LAB — CBC
HCT: 27.6 % — ABNORMAL LOW (ref 36.0–46.0)
Hemoglobin: 9.1 g/dL — ABNORMAL LOW (ref 12.0–15.0)
MCH: 28.7 pg (ref 26.0–34.0)
MCHC: 33 g/dL (ref 30.0–36.0)
MCV: 87.1 fL (ref 80.0–100.0)
Platelets: 255 10*3/uL (ref 150–400)
RBC: 3.17 MIL/uL — ABNORMAL LOW (ref 3.87–5.11)
RDW: 15.9 % — ABNORMAL HIGH (ref 11.5–15.5)
WBC: 9.5 10*3/uL (ref 4.0–10.5)
nRBC: 0 % (ref 0.0–0.2)

## 2023-05-11 MED ORDER — DICLOFENAC SODIUM 1 % EX GEL
2.0000 g | Freq: Four times a day (QID) | CUTANEOUS | 0 refills | Status: DC
  Filled 2023-05-11: qty 100, 13d supply, fill #0

## 2023-05-11 MED ORDER — SENNA 8.6 MG PO TABS
1.0000 | ORAL_TABLET | Freq: Every day | ORAL | 0 refills | Status: DC
  Filled 2023-05-11: qty 120, 120d supply, fill #0

## 2023-05-11 MED ORDER — DOCUSATE SODIUM 100 MG PO CAPS
100.0000 mg | ORAL_CAPSULE | Freq: Every day | ORAL | 0 refills | Status: DC
  Filled 2023-05-11: qty 30, 30d supply, fill #0

## 2023-05-11 MED ORDER — ACETAMINOPHEN 325 MG PO TABS: 650.0000 mg | ORAL_TABLET | ORAL | Status: DC | PRN

## 2023-05-11 MED ORDER — MIDODRINE HCL 10 MG PO TABS
15.0000 mg | ORAL_TABLET | Freq: Three times a day (TID) | ORAL | 0 refills | Status: DC
  Filled 2023-05-11: qty 45, 10d supply, fill #0

## 2023-05-11 MED ORDER — SODIUM CHLORIDE 0.9 % IV SOLN: INTRAVENOUS | Status: AC

## 2023-05-11 MED ORDER — DEXTROSE-SODIUM CHLORIDE 5-0.9 % IV SOLN: INTRAVENOUS | Status: DC

## 2023-05-11 MED ORDER — FLUDROCORTISONE ACETATE 0.1 MG PO TABS
0.2000 mg | ORAL_TABLET | Freq: Every day | ORAL | 0 refills | Status: DC
  Filled 2023-05-11: qty 30, 15d supply, fill #0

## 2023-05-11 NOTE — Progress Notes (Addendum)
Inpatient Rehabilitation Discharge Medication Review by a Pharmacist  A complete drug regimen review was completed for this patient to identify any potential clinically significant medication issues.  High Risk Drug Classes Is patient taking? Indication by Medication  Antipsychotic No   Anticoagulant No   Antibiotic No   Opioid Yes Tramadol - acute pain  Antiplatelet No   Hypoglycemics/insulin Yes Steglaro - DM  Vasoactive Medication No   Chemotherapy No   Other Yes Melatonin- sleep Neurontin- neuropathic pain Synthroid- hypothyroidism Protonix- GERD Crestor- HLD Megace - appetite Midodrine/florinef - hypotension Voltaren gel - pain Vitamin D/MVI - supplementation Lactaid - lactose intolerance     Type of Medication Issue Identified Description of Issue Recommendation(s)  Drug Interaction(s) (clinically significant)     Duplicate Therapy     Allergy     No Medication Administration End Date     Incorrect Dose     Additional Drug Therapy Needed     Significant med changes from prior encounter (inform family/care partners about these prior to discharge).    Other  PTA meds: Steglatro Glipizide Lantus Prilosec Ditropan-XL Restart PTA meds when and if necessary during CIR admission or at time of discharge, if warranted     Clinically significant medication issues were identified that warrant physician communication and completion of prescribed/recommended actions by midnight of the next day:  No    Time spent performing this drug regimen review (minutes):  [address], PharmD, [place], AAHIVP, CPP Infectious Disease Pharmacist 05/11/2023 7:29 AM

## 2023-05-11 NOTE — Progress Notes (Signed)
PROGRESS NOTE   Subjective/Complaints: Patient denies dizziness lightheadedness today.  Discharge delayed today due to renal function.  ROS:   Pt denies SOB, abd pain, CP, N/V/C/D, and vision changes + HA   Except for HPI  Objective:   No results found. Recent Labs    05/11/23 0506  WBC 9.5  HGB 9.1*  HCT 27.6*  PLT 255      Recent Labs    05/11/23 0506  NA 139  K 4.6  CL 108  CO2 20*  GLUCOSE 145*  BUN 26*  CREATININE 1.77*  CALCIUM 9.1      Intake/Output Summary (Last 24 hours) at 05/11/2023 1406 Last data filed at 05/11/2023 0721 Gross per 24 hour  Intake 598 ml  Output --  Net 598 ml        Physical Exam: Vital Signs Blood pressure (!) 115/54, pulse 77, temperature 97.8 F (36.6 C), resp. rate 18, height 5\' 7"  (1.702 m), weight 58.1 kg, SpO2 98%.       General: awake, alert, appropriate, sitting in chair, comfortable;  NAD HENT: conjugate gaze; oropharynx moist CV: regular rate and rhythm; no JVD Pulmonary: CTA B/L; nonlabored breathing GI: soft, NT, ND, (+)BS Psychiatric: appropriate- think she's hiding some of her dizziness from me-suspect this is the case today as well Neurological: Ox3   Prior EXAM Neurological: Ox3 - MAS of 1- but less than yesterday in Ue's with hoffman's (+) B/L- however more ROM of arms B/L - stable Moving all 4 extremities in bed Skin: Protective dressing on heels been removed, no open wounds noted Neuro: Alert and oriented x 4, follows commands, cranial nerves II through XII intact, voice is a little hoarse, memory intact, fair insight Sensation intact to light touch in all 4 extremities however decreased in stocking glove type distribution below her knees-she reports this is chronic RUE: 4-/5 Deltoid, 4/5 Biceps, 4/5 Triceps, 4/5 Wrist Ext, 4/5 Grip, intrinsic hand muscles/fifth digit abduction 1 out of 5 LUE: 4/5 Deltoid, 4/5 Biceps, 4/5 Triceps, 4/5  Wrist Ext, 4/5 Grip, intrinsic hand muscles/fifth digit abduction 3 out of 5 RLE: HF 2/5, KE 3/5, ADF 1/5, APF 2-3/5 LLE: HF 4/5, KE 4/5,  ADF 4/5, APF 4/5 No ankle clonus, exam limited by sedation from anesthesia on 10/27 Musculoskeletal:  Decreased C-spine ROM Tender over right greater trochanter, pelvis Pain with ROM of right lower extremity  Assessment/Plan: 1. Functional deficits which require 3+ hours per day of interdisciplinary therapy in a comprehensive inpatient rehab setting. Physiatrist is providing close team supervision and 24 hour management of active medical problems listed below. Physiatrist and rehab team continue to assess barriers to discharge/monitor patient progress toward functional and medical goals  Care Tool:  Bathing    Body parts bathed by patient: Right arm, Left arm, Chest, Abdomen, Front perineal area, Buttocks, Right upper leg, Left upper leg, Face, Right lower leg, Left lower leg   Body parts bathed by helper: Right lower leg, Left lower leg     Bathing assist Assist Level: Supervision/Verbal cueing     Upper Body Dressing/Undressing Upper body dressing   What is the patient wearing?: Pull over shirt  Upper body assist Assist Level: Supervision/Verbal cueing    Lower Body Dressing/Undressing Lower body dressing      What is the patient wearing?: Pants     Lower body assist Assist for lower body dressing: Supervision/Verbal cueing     Toileting Toileting Toileting Activity did not occur (Clothing management and hygiene only): Refused  Toileting assist Assist for toileting: Supervision/Verbal cueing     Transfers Chair/bed transfer  Transfers assist     Chair/bed transfer assist level: Contact Guard/Touching assist     Locomotion Ambulation   Ambulation assist   Ambulation activity did not occur: Safety/medical concerns  Assist level: Contact Guard/Touching assist Assistive device: Walker-rolling Max distance: 2    Walk 10 feet activity   Assist  Walk 10 feet activity did not occur: Safety/medical concerns  Assist level: Contact Guard/Touching assist Assistive device: Walker-rolling   Walk 50 feet activity   Assist Walk 50 feet with 2 turns activity did not occur: Safety/medical concerns         Walk 150 feet activity   Assist Walk 150 feet activity did not occur: Safety/medical concerns         Walk 10 feet on uneven surface  activity   Assist Walk 10 feet on uneven surfaces activity did not occur: Safety/medical concerns         Wheelchair     Assist Is the patient using a wheelchair?: Yes Type of Wheelchair: Manual Wheelchair activity did not occur: Safety/medical concerns         Wheelchair 50 feet with 2 turns activity    Assist    Wheelchair 50 feet with 2 turns activity did not occur: Safety/medical concerns       Wheelchair 150 feet activity     Assist  Wheelchair 150 feet activity did not occur: Safety/medical concerns       Blood pressure (!) 115/54, pulse 77, temperature 97.8 F (36.6 C), resp. rate 18, height 5\' 7"  (1.702 m), weight 58.1 kg, SpO2 98%.  Medical Problem List and Plan: 1. Functional deficits secondary to incomplete quadriplegia- ASIA D after fall with history of longstanding cervical stenosis             -patient may  shower             -ELOS/Goals: 12 to 16 days, PT/OT supervision to mod I, SLP N/A D/w OT about low BP again this AM  Added Florinef- will take a few days to kick in.   Balloon dilation last  Sunday- can advance to soft diet today per GI note.   To d/c 11/4- however need to f/u on labs before d/c- needs to see me in f/u- as well as PCP and NSU.   -doesn't need pain meds- not taking  Stopped amantadine and increased midodrine to 15 mg TID- be clear with pt takes 4-5 hours and midodrine wears off  Also increased florinef- early to do so, however BP so low, so had to change meds  -Will delayed  discharge today due to BMP with elevated creatinine and BUN 2.  Antithrombotics: -DVT/anticoagulation:  Pharmaceutical: Lovenox             -antiplatelet therapy: none   3. Pain Management: Tylenol, Percocet as needed             -continue gabapentin 200 mg twice daily   10/17- no complaints of pain- con't regimen   10/19-reports pain in her right arm, right hip and right foot.  Overall  under control with current medications   10/20 reports pain is overall under control, continue current regimen  10/23- is taking Oxy which can contribute to sedation- last dose 8pm night prior- changed Oxy to Tramadol 50 mg q12 hours prn  10/24- Will add Voltaren gel 2 G QID for R toe pain- doesn't look like gout on exam, however if gets worse, will change to steroids.   10/30- pain is not an issue at this time  11/2- pain adds up when sits too long in bedside chair 11/3- not taking tramadol or oxy- since 10/24- so no need to send home on pain meds.  11/4 continue Tylenol as needed for headache, she has not taken this yet 4. Mood/Behavior/Sleep: LCSW to evaluate and provide emotional support             -antipsychotic agents: n/a   5. Neuropsych/cognition: This patient is capable of making decisions on her own behalf.   6. Skin/Wound Care: Routine skin care checks  10/24- Nonblancahable on R heel- stage I- will make sure wearing PRAFO and d/w nursing- will get foam as well for R heel   10/30- will float heels since refuses ot wear PRAFOs.  7. Fluids/Electrolytes/Nutrition: Routine Is and Os and follow-up chemistries             --D3 diet, thin liquids             -lactose intolerant: continue Lactaid   8: Hypothyroidism with history of multinodular goiter s/p total thyroidectomy: continue Synthroid.  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  Hemoglobin A1c was 10.3 in 2014              -per DC: "Patient may not need any oral meds @ discharge home along with her Mounjaro."    10/17- pt's CBGs 115-173 in last 24 hours- will monitor  10/18- overall controlled- con't regimen  -10/ 20- 10/22 CBGs well-controlled, continue to monitor  10/23- Cbgs running well- con't regimen  10/28- 1 BG elevated in 180s- otherwise less than 150  10/29-10/30- overall controlled- needs to get Joycelyn Man brought in from  Home 11/1 CBG 241 this afternoon although CBGs appear controlled prior to this, would benefit form restart monjaro,could consider starting correctional insulin tomorrow am for short term is still not at goal 11/2- Cbgs running MUCH higher since [place] hasn't been restarted- got up to 314 last night- since drank pepsi and drinking gatorade- asked her to stop gatorade and pepsi. Don't want to start Insulin except SSI- which I started due to Cbgs so high 11/3- pt hasn't gotten her Greggory Keen- so BG's getting higher- will take when gets home per pt- on Ssi until d/c.  11/4 CBGs overall controlled, continue current regimen while in the hospital CBG (last 3)  Recent Labs    05/10/23 2116 05/11/23 0609 05/11/23 1133  GLUCAP 213* 145* 181*    11: Orthostatic hypotension: place TED hose and abd binder.  Improved after fluids given   10/17- will probably be an issue today- not drinking well  10/18-BP running 90's to 100s systolic- if has orthostasis- will need Midodrine   10/ 20 BP controlled, continue to monitor with activity  10/21- HR 107- and BP on low side- will monitor  10/22- started midodrine for OH- will monitor  10/23- said still had some dizziness- will con't Midodrine  10/25- BP better  with therapy- con't midodrine  10/28- BP still on low side, but less orthostasis   10/29- had severe Orthostasis this Am- but hadn't received her meds yet- will change to get at 6:30 in Am and 11 am and 1600 for last dose- so gets better control- also increased Midodrine to 10 mg TID  10/30-  doing much better with Orthostasis- BP better than yesterday WITH teds, today without teds. On Max dose of Midodrine, though- if need be, will have to add Florinef.   10/31- will add Florinef 0.1 mg daily- today and see if that helps BP- but con't Midodrine since BP still dropping os much.   11/1 monitor response to medication change, Florinef may take a week or more for full effect  11/2- increased midodrine to 15 mg TID and also increased florinef- since BP 70s/40s this AM and dizzy with therapy. Also stopped Amatnadine since only med that could drop BP.   11/3- BP still dropping to 68 systolic without ACE wraps (had on TEDS) and 79 systolic with TEDS and ACE wraps- but up to 180s systolic when in bed at night- explained need her to drink so can bring up BP- on max dose of midodrine and have increased florinef as well- don't really have anything else that can be done except IVFs which I'm trying to avoid. If doesn't do well or gets dizzy /syncope before d/c; will need to call Cards. 11/4 IVF started today due to kidney function, may help with hypotension as well    05/11/2023    5:40 AM 05/10/2023    8:12 PM 05/10/2023    1:10 PM  Vitals with BMI  Systolic 115 159 409  Diastolic 54 63 71  Pulse 77 80 81    12: Normocytic anemia, chronic: follow-up CBC   -11/4 hemoglobin up to 9.1 13: GI prophylaxis: continue PPI   14: SCI: continue cervical collar when out of bed or transporting               -bladder scan/check PVRs    10/17- her RLE ankle DF/PF is stronger than seen yesterday - and LLE stable at 3-/5- will wait on PRAFOs. Also doesn't appear to need [facility]  10/22- got PRAFOs per therapy- heels a little red  10/23- Pt hasn't worn PRAFOs yet- on floor in bag- will d/w Nursing 10/24- PRAFO on R and Prevalon on L  10/30- refusing to wear PRAFOs- will change order to float heels- since she just takes off.  10/31- educated needs to float heels or can get pressure ulcers on heels- pt voided  understanding 15: Right superior and inferior pubic rami fractures: Weightbearing as tolerated   16: Right arm pain: X-ray on 10/13 negative   17: Constipation vs neurogenic bowel: Give sorbitol 60 cc now>>enema if no results   10/29- having BM's per pt 18. CKD IIIA.  Recheck labs.  Avoid nephrotoxic medications   10/17-BUN up to 1.39 from 1.28 however BUN better at 28- down from 37- will push fluids- write order and recheck in AM- will also check CMP weekly. 10/9 creatinine/BUN stable at 1.24/30 10/21- Cr 1.21 and BUN 29- stable for her- pepsi drinker 10/22- About the same with BUN 25 and Cr 1.27 after IVFs- will wait on more IVFs 10/29 Cr 1.34- and BUN 17- drinking a LOT of pepsi 11/2- will check labs Monday 11/3- think pt dry- and so as a result, making hypotension worse- told pt her labs in AM tell us if she  can leave tomorrow 11/4 BUN and creatinine increased today to 26/1.77, IVF for CKD 3a normal saline 75 mL/h, recheck tomorrow 19. Neurogenic bowel?  10/17- LBM last night after fleets enema- but had been >5 days since LBM- will monitor and see if needs bowel program  10/ 20 add Senokot at bedtime  10/23- LBM yesterday 11.1 Last BM 10/30, she denies feeling constipated.  Continue to monitor bowel function 11/2- LBM 4 days ago- however pt says feels good- if no BM by tomorrow- will give Sorbitol. 11/3- no BM- will give sorbitol and add miralax to meds   11/4 LBM yesterday 20. Neurogenic bladder?  10/17- having a lot of incontinent voids- will check bladder scans to see if due to overflow and is retaining- have ordered  10/18- 2 bladder scans- 0 and 12ml- will con't for now- but her incontinence has resolved it appears, so doing better  -10/ 20 continent, bladder scans not elevated  10/30- going well  11/1 continent bladder scans and 100 mL  11/2- Bladder scans look <100cc- so will stop 21. Sedation  10/21- might be baclofen we started- will monitor 22. Spasticity-   10/21-  started Baclofen 5 mg TID for this- might be contributing to #21- -also added [place] PRAFO order for RLE  10/22- therapy feels issues with therapy today were due to orthostatic hypotension, not sedation from baclofen  - pt's spasticity is better with baclofen  10/23- stopped Baclofen for now since so sedated  10/28- sedation resolved  10/31- Might need spasticity meds in future, but not now 23. Orthostatic hypotension  10/22- will start Midodrine 5 mg TID-with meals for low BP in therapy today even with TEDs- also been using ACE wraps intermittently.   10/30- increased midodrine to 10 mg TID and changed times to 6:30am- 11am and 4pm- doing better, BP wise this AM- in 90s- sitting up without TEDs- so better than yesterday with TEDs.   10/31- Pt's BP was 94 systolic without TEDs and 111 with TEDs this AM- educated pt on orthostatic hypotension- how needs to get Midodrine at home before gets OOB; wear TEDs- and don't be late on meds. Will add Florrinef 0.1 mg daily as well 11/3- Increased florinef to 0.2 mg yesterday and increased midodrine to 15 mg TID yesterday- BP spiked overnight, however dropping 30+ points with standing- and more than 80 points from when laying in bed at night-I think she's dry- she's willing to drink and we will check CMP in AM before leaves- no other signs of infection.    24. Nausea/vomiting  10/23- pt had episode of vomiting this AM- has Zofran as needed for nausea- doesn't want right now- don't want her to have phenergan, because more sedating. 10/27: appears to have resolved  25. Sedation- Lethargy  10/23- will stop Oxy; decrease Gabapentin to 100 mg BID; reviewed her TSH- is 0.5- added Amantadine 100 mg daily to see if will help her wake up/have more energy.   10/27 sedated today from anesthesia  10/30- much more awake  11/2- will stop Amatnadine since can be adding to low BP  11/3- doesn't appear to be more sedated today- so will keep off 26. GI  issues/dysphagia  10/23- per GI prior to admission, needed an endoscopy and barium swallow- if MBSS shows it, will call consult to GI  10/24- pt said doesn't think GI issues main cause of not eating- feels it's poor appetite  10/25- due to Severe esophageal stricture- less than 5mm- GI checking to  see if can be dilated, etc- barium study ordered  10/27: discussed with GI and she tolerated dilation  10/28- will upgrade diet to soft D3 diet per GI- could do today and will NOT upgrade to regular-  27. Poor appetite  10/24- will add Megace for appetite. Don't want to add Periactin or Remeron because sedating- also ate <25% of tray this AM- however said would eat banana when done with PT  10/25- place don lqiuid diet and changed meds to liquid/crushed  10/26: Megace held temporarily since can not be crushed and is not in liquid form  10/27: discussed with GI and continued liquid diet recommended  10/29- changed ot D3 diet  Eating better  10/30- changed meds back to pills- d/w pharmacy- they will make the change for [place].   10/31- said poor appetite, but eating well- in spite of it.   11/1 she ate 85 to 100% of her meals today!  11/2- eating a LOT per pt.   11/3- told pt needs to drink well to leave Rehab tomorrow- so when labs checked, isn't dry   LOS: 19 days A FACE TO FACE EVALUATION WAS PERFORMED  Fanny Dance 05/11/2023, 2:06 PM

## 2023-05-11 NOTE — Progress Notes (Signed)
Patient ID: [name], female   DOB: [birth year], 67 y.o.   MRN: [id number]  SW informed by medical team pt will be held for today due to medical concerns.    Cecile Sheerer, MSW, LCSW Office: [phone] Cell: [phone] Fax: [phone]

## 2023-05-12 LAB — GLUCOSE, CAPILLARY: Glucose-Capillary: 143 mg/dL — ABNORMAL HIGH (ref 70–99)

## 2023-05-12 LAB — BASIC METABOLIC PANEL
Anion gap: 9 (ref 5–15)
BUN: 23 mg/dL (ref 8–23)
CO2: 20 mmol/L — ABNORMAL LOW (ref 22–32)
Calcium: 8.6 mg/dL — ABNORMAL LOW (ref 8.9–10.3)
Chloride: 111 mmol/L (ref 98–111)
Creatinine, Ser: 1.32 mg/dL — ABNORMAL HIGH (ref 0.44–1.00)
GFR, Estimated: 45 mL/min — ABNORMAL LOW (ref >60)
Glucose, Bld: 145 mg/dL — ABNORMAL HIGH (ref 70–99)
Potassium: 4.4 mmol/L (ref 3.5–5.1)
Sodium: 140 mmol/L (ref 135–145)

## 2023-05-12 NOTE — Progress Notes (Signed)
PROGRESS NOTE   Subjective/Complaints:  Pt reports ready for d/c We discussed can d/c today, however needs to keep up with PO fluids-  Went over labs with her and advised about IVFs, and low BP. To take BP at home- has BP monitor.   ROS:   Pt denies SOB, abd pain, CP, N/V/C/D, and vision changes   Except for HPI  Objective:   No results found. Recent Labs    05/11/23 0506  WBC 9.5  HGB 9.1*  HCT 27.6*  PLT 255      Recent Labs    05/11/23 0506 05/12/23 0502  NA 139 140  K 4.6 4.4  CL 108 111  CO2 20* 20*  GLUCOSE 145* 145*  BUN 26* 23  CREATININE 1.77* 1.32*  CALCIUM 9.1 8.6*      Intake/Output Summary (Last 24 hours) at 05/12/2023 0820 Last data filed at 05/11/2023 2249 Gross per 24 hour  Intake 237 ml  Output 650 ml  Net -413 ml        Physical Exam: Vital Signs Blood pressure 123/64, pulse 76, temperature 98.6 F (37 C), resp. rate 17, height 5\' 7"  (1.702 m), weight 58.1 kg, SpO2 98%.        General: awake, alert, appropriate, NAD HENT: conjugate gaze; oropharynx dry- has tiny nose bleed stopped with 2 minutes pressure-  CV: regular rate and rhythm; no JVD Pulmonary: CTA B/L; no W/R/R- good air movement GI: soft, NT, ND, (+)BS Psychiatric: appropriate- ready for d/c.  Neurological: Ox3   Prior EXAM Neurological: Ox3 - MAS of 1- but less than yesterday in [facility]'s with hoffman's (+) B/L- however more ROM of arms B/L - stable Moving all 4 extremities in bed Skin: Protective dressing on heels been removed, no open wounds noted Neuro: Alert and oriented x 4, follows commands, cranial nerves II through XII intact, voice is a little hoarse, memory intact, fair insight Sensation intact to light touch in all 4 extremities however decreased in stocking glove type distribution below her knees-she reports this is chronic RUE: 4-/5 Deltoid, 4/5 Biceps, 4/5 Triceps, 4/5 Wrist Ext, 4/5 Grip,  intrinsic hand muscles/fifth digit abduction 1 out of 5 LUE: 4/5 Deltoid, 4/5 Biceps, 4/5 Triceps, 4/5 Wrist Ext, 4/5 Grip, intrinsic hand muscles/fifth digit abduction 3 out of 5 RLE: HF 2/5, KE 3/5, ADF 1/5, APF 2-3/5 LLE: HF 4/5, KE 4/5,  ADF 4/5, APF 4/5 No ankle clonus, exam limited by sedation from anesthesia on 10/27 Musculoskeletal:  Decreased C-spine ROM Tender over right greater trochanter, pelvis Pain with ROM of right lower extremity  Assessment/Plan: 1. Functional deficits which require 3+ hours per day of interdisciplinary therapy in a comprehensive inpatient rehab setting. Physiatrist is providing close team supervision and 24 hour management of active medical problems listed below. Physiatrist and rehab team continue to assess barriers to discharge/monitor patient progress toward functional and medical goals  Care Tool:  Bathing    Body parts bathed by patient: Right arm, Left arm, Chest, Abdomen, Front perineal area, Buttocks, Right upper leg, Left upper leg, Face, Right lower leg, Left lower leg   Body parts bathed by helper: Right lower leg, Left lower leg  Bathing assist Assist Level: Supervision/Verbal cueing     Upper Body Dressing/Undressing Upper body dressing   What is the patient wearing?: Pull over shirt    Upper body assist Assist Level: Supervision/Verbal cueing    Lower Body Dressing/Undressing Lower body dressing      What is the patient wearing?: Pants     Lower body assist Assist for lower body dressing: Supervision/Verbal cueing     Toileting Toileting Toileting Activity did not occur (Clothing management and hygiene only): Refused  Toileting assist Assist for toileting: Supervision/Verbal cueing     Transfers Chair/bed transfer  Transfers assist     Chair/bed transfer assist level: Contact Guard/Touching assist     Locomotion Ambulation   Ambulation assist   Ambulation activity did not occur: Safety/medical  concerns  Assist level: Contact Guard/Touching assist Assistive device: Walker-rolling Max distance: 2   Walk 10 feet activity   Assist  Walk 10 feet activity did not occur: Safety/medical concerns  Assist level: Contact Guard/Touching assist Assistive device: Walker-rolling   Walk 50 feet activity   Assist Walk 50 feet with 2 turns activity did not occur: Safety/medical concerns         Walk 150 feet activity   Assist Walk 150 feet activity did not occur: Safety/medical concerns         Walk 10 feet on uneven surface  activity   Assist Walk 10 feet on uneven surfaces activity did not occur: Safety/medical concerns         Wheelchair     Assist Is the patient using a wheelchair?: Yes Type of Wheelchair: Manual Wheelchair activity did not occur: Safety/medical concerns         Wheelchair 50 feet with 2 turns activity    Assist    Wheelchair 50 feet with 2 turns activity did not occur: Safety/medical concerns       Wheelchair 150 feet activity     Assist  Wheelchair 150 feet activity did not occur: Safety/medical concerns       Blood pressure 123/64, pulse 76, temperature 98.6 F (37 C), resp. rate 17, height 5\' 7"  (1.702 m), weight 58.1 kg, SpO2 98%.  Medical Problem List and Plan: 1. Functional deficits secondary to incomplete quadriplegia- ASIA D after fall with history of longstanding cervical stenosis             -patient may  shower             -ELOS/Goals: 12 to 16 days, PT/OT supervision to mod I, SLP N/A D/w OT about low BP again this AM  Added Florinef- will take a few days to kick in.   Balloon dilation last  Sunday- can advance to soft diet today per GI note.   To d/c 11/4- however need to f/u on labs before d/c- needs to see me in f/u- as well as PCP and NSU.   -doesn't need pain meds- not taking  Stopped amantadine and increased midodrine to 15 mg TID- be clear with pt takes 4-5 hours and midodrine wears off  Also  increased florinef- early to do so, however BP so low, so had to change meds  D/c today Advised to drink 60 oz/day of water or sugar free gatorade.   Also needs f/u with me, NSU and to call PCP to get appt in 30 days.  2.  Antithrombotics: -DVT/anticoagulation:  Pharmaceutical: Lovenox             -antiplatelet therapy: none   3.  Pain Management: Tylenol, Percocet as needed             -continue gabapentin 200 mg twice daily   10/17- no complaints of pain- con't regimen   10/19-reports pain in her right arm, right hip and right foot.  Overall under control with current medications   10/20 reports pain is overall under control, continue current regimen  10/23- is taking Oxy which can contribute to sedation- last dose 8pm night prior- changed Oxy to Tramadol 50 mg q12 hours prn  10/24- Will add Voltaren gel 2 G QID for R toe pain- doesn't look like gout on exam, however if gets worse, will change to steroids.   10/30- pain is not an issue at this time  11/2- pain adds up when sits too long in bedside chair 11/3- not taking tramadol or oxy- since 10/24- so no need to send home on pain meds.  11/4 continue Tylenol as needed for headache, she has not taken this yet 4. Mood/Behavior/Sleep: LCSW to evaluate and provide emotional support             -antipsychotic agents: n/a   5. Neuropsych/cognition: This patient is capable of making decisions on her own behalf.   6. Skin/Wound Care: Routine skin care checks  10/24- Nonblancahable on R heel- stage I- will make sure wearing PRAFO and d/w nursing- will get foam as well for R heel   10/30- will float heels since refuses ot wear PRAFOs.  7. Fluids/Electrolytes/Nutrition: Routine Is and Os and follow-up chemistries             --D3 diet, thin liquids             -lactose intolerant: continue Lactaid   8: Hypothyroidism with history of multinodular goiter s/p total thyroidectomy: continue Synthroid.  T4 2.01, TSH 0.053 on 10/7   9:  Hyperlipidemia: continue statin   10: Diabetes mellitus type 2 with polyneuropathy: A1c = 5.9% CBGs QID (CBG range 115-150).  Hemoglobin A1c was 10.3 in 2014             -per DC: "Patient may not need any oral meds @ discharge home along with her Mounjaro."    10/17- pt's CBGs 115-173 in last 24 hours- will monitor  10/18- overall controlled- con't regimen  -10/ 20- 10/22 CBGs well-controlled, continue to monitor  10/23- Cbgs running well- con't regimen  10/28- 1 BG elevated in 180s- otherwise less than 150  10/29-10/30- overall controlled- needs to get Joycelyn Man brought in from  Home 11/1 CBG 241 this afternoon although CBGs appear controlled prior to this, would benefit form restart monjaro,could consider starting correctional insulin tomorrow am for short term is still not at goal 11/2- Cbgs running MUCH higher since [place] hasn't been restarted- got up to 314 last night- since drank pepsi and drinking gatorade- asked her to stop gatorade and pepsi. Don't want to start Insulin except SSI- which I started due to Cbgs so high 11/3- pt hasn't gotten her Greggory Keen- so BG's getting higher- will take when gets home per pt- on Ssi until d/c.  11/4 CBGs overall controlled, continue current regimen while in the hospital 11/5- will need to restart Mounjaro after d/c. Also advised no more than 12 oz pepsi daily.  CBG (last 3)  Recent Labs    05/11/23 1133 05/11/23 1643 05/11/23 2054  GLUCAP 181* 315* 280*    11: Orthostatic hypotension: place TED hose and abd binder.  Improved after fluids given   10/17-  will probably be an issue today- not drinking well  10/18-BP running 90's to 100s systolic- if has orthostasis- will need Midodrine   10/ 20 BP controlled, continue to monitor with activity  10/21- HR 107- and BP on low side- will monitor  10/22- started midodrine for OH- will monitor  10/23- said still had some dizziness- will con't Midodrine  10/25- BP better with therapy- con't  midodrine  10/28- BP still on low side, but less orthostasis   10/29- had severe Orthostasis this Am- but hadn't received her meds yet- will change to get at 6:30 in Am and 11 am and 1600 for last dose- so gets better control- also increased Midodrine to 10 mg TID  10/30- doing much better with Orthostasis- BP better than yesterday WITH teds, today without teds. On Max dose of Midodrine, though- if need be, will have to add Florinef.   10/31- will add Florinef 0.1 mg daily- today and see if that helps BP- but con't Midodrine since BP still dropping os much.   11/1 monitor response to medication change, Florinef may take a week or more for full effect  11/2- increased midodrine to 15 mg TID and also increased florinef- since BP 70s/40s this AM and dizzy with therapy. Also stopped Amatnadine since only med that could drop BP.   11/3- BP still dropping to 68 systolic without ACE wraps (had on TEDS) and 79 systolic with TEDS and ACE wraps- but up to 180s systolic when in bed at night- explained need her to drink so can bring up BP- on max dose of midodrine and have increased florinef as well- don't really have anything else that can be done except IVFs which I'm trying to avoid. If doesn't do well or gets dizzy /syncope before d/c; will need to call Cards. 11/4 IVF started today due to kidney function, may help with hypotension as well 11/5- Will send home on Florinef and Midodrine- explained to take midodrine before gets OOB- - take BP multiple times per day and then can titrate BP meds as needed    05/12/2023    5:39 AM 05/11/2023    8:12 PM 05/11/2023    2:21 PM  Vitals with BMI  Systolic 123 159 161  Diastolic 64 64 66  Pulse 76 70 72    12: Normocytic anemia, chronic: follow-up CBC   -11/4 hemoglobin up to 9.1 13: GI prophylaxis: continue PPI   14: SCI: continue cervical collar when out of bed or transporting               -bladder scan/check PVRs    10/17- her RLE ankle DF/PF is stronger  than seen yesterday - and LLE stable at 3-/5- will wait on PRAFOs. Also doesn't appear to need [facility]  10/22- got PRAFOs per therapy- heels a little red  10/23- Pt hasn't worn PRAFOs yet- on floor in bag- will d/w Nursing 10/24- PRAFO on R and Prevalon on L  10/30- refusing to wear PRAFOs- will change order to float heels- since she just takes off.  10/31- educated needs to float heels or can get pressure ulcers on heels- pt voided understanding 15: Right superior and inferior pubic rami fractures: Weightbearing as tolerated   16: Right arm pain: X-ray on 10/13 negative   17: Constipation vs neurogenic bowel: Give sorbitol 60 cc now>>enema if no results   10/29- having BM's per pt 18. CKD IIIA.  Recheck labs.  Avoid nephrotoxic medications   10/17-BUN up to  1.39 from 1.28 however BUN better at 28- down from 37- will push fluids- write order and recheck in AM- will also check CMP weekly. 10/9 creatinine/BUN stable at 1.24/30 10/21- Cr 1.21 and BUN 29- stable for her- pepsi drinker 10/22- About the same with BUN 25 and Cr 1.27 after IVFs- will wait on more IVFs 10/29 Cr 1.34- and BUN 17- drinking a LOT of pepsi 11/2- will check labs Monday 11/3- think pt dry- and so as a result, making hypotension worse- told pt her labs in AM tell us if she can leave tomorrow 11/4 BUN and creatinine increased today to 26/1.77, IVF for CKD 3a normal saline 75 mL/h, recheck tomorrow 11/5- Pts Cr down to 1.32 from 1.77 and BUN 23 from 26- will stop IVFs and IV when family gets here- went over AGAIN how to drink at least 4 bottles of 16 oz water daily- and need to continue, not just for short term, but will mess with her kidneys if she doesn't.  19. Neurogenic bowel?  10/17- LBM last night after fleets enema- but had been >5 days since LBM- will monitor and see if needs bowel program  10/ 20 add Senokot at bedtime  10/23- LBM yesterday 11.1 Last BM 10/30, she denies feeling constipated.  Continue to monitor bowel  function 11/2- LBM 4 days ago- however pt says feels good- if no BM by tomorrow- will give Sorbitol. 11/3- no BM- will give sorbitol and add miralax to meds   11/4 LBM yesterday 20. Neurogenic bladder?  10/17- having a lot of incontinent voids- will check bladder scans to see if due to overflow and is retaining- have ordered  10/18- 2 bladder scans- 0 and 12ml- will con't for now- but her incontinence has resolved it appears, so doing better  -10/ 20 continent, bladder scans not elevated  10/30- going well  11/1 continent bladder scans and 100 mL  11/2- Bladder scans look <100cc- so will stop 21. Sedation  10/21- might be baclofen we started- will monitor 22. Spasticity-   10/21- started Baclofen 5 mg TID for this- might be contributing to #21- -also added [place] PRAFO order for RLE  10/22- therapy feels issues with therapy today were due to orthostatic hypotension, not sedation from baclofen  - pt's spasticity is better with baclofen  10/23- stopped Baclofen for now since so sedated  10/28- sedation resolved  10/31- Might need spasticity meds in future, but not now 23. Orthostatic hypotension  10/22- will start Midodrine 5 mg TID-with meals for low BP in therapy today even with TEDs- also been using ACE wraps intermittently.   10/30- increased midodrine to 10 mg TID and changed times to 6:30am- 11am and 4pm- doing better, BP wise this AM- in 90s- sitting up without TEDs- so better than yesterday with TEDs.   10/31- Pt's BP was 94 systolic without TEDs and 111 with TEDs this AM- educated pt on orthostatic hypotension- how needs to get Midodrine at home before gets OOB; wear TEDs- and don't be late on meds. Will add Florrinef 0.1 mg daily as well 11/3- Increased florinef to 0.2 mg yesterday and increased midodrine to 15 mg TID yesterday- BP spiked overnight, however dropping 30+ points with standing- and more than 80 points from when laying in bed at night-I think she's dry- she's willing to  drink and we will check CMP in AM before leaves- no other signs of infection.    24. Nausea/vomiting  10/23- pt had episode of vomiting this  AM- has Zofran as needed for nausea- doesn't want right now- don't want her to have phenergan, because more sedating. 10/27: appears to have resolved  25. Sedation- Lethargy  10/23- will stop Oxy; decrease Gabapentin to 100 mg BID; reviewed her TSH- is 0.5- added Amantadine 100 mg daily to see if will help her wake up/have more energy.   10/27 sedated today from anesthesia  10/30- much more awake  11/2- will stop Amatnadine since can be adding to low BP  11/3- doesn't appear to be more sedated today- so will keep off 26. GI issues/dysphagia  10/23- per GI prior to admission, needed an endoscopy and barium swallow- if MBSS shows it, will call consult to GI  10/24- pt said doesn't think GI issues main cause of not eating- feels it's poor appetite  10/25- due to Severe esophageal stricture- less than 5mm- GI checking to see if can be dilated, etc- barium study ordered  10/27: discussed with GI and she tolerated dilation  10/28- will upgrade diet to soft D3 diet per GI- could do today and will NOT upgrade to regular-  27. Poor appetite  10/24- will add Megace for appetite. Don't want to add Periactin or Remeron because sedating- also ate <25% of tray this AM- however said would eat banana when done with PT  10/25- place don lqiuid diet and changed meds to liquid/crushed  10/26: Megace held temporarily since can not be crushed and is not in liquid form  10/27: discussed with GI and continued liquid diet recommended  10/29- changed ot D3 diet  Eating better  10/30- changed meds back to pills- d/w pharmacy- they will make the change for [place].   10/31- said poor appetite, but eating well- in spite of it.   11/1 she ate 85 to 100% of her meals today!  11/2- eating a LOT per pt.   11/3- told pt needs to drink well to leave Rehab tomorrow- so when labs checked,  isn't dry  11/5- d/w pt that IVF's  brought her Cr from 1.77 down to 1.32- but she needs to continue to drink- I know she doesn't like water, but suggested Mio/crystal light to change flavor- needs to drink at least 4 16 oz bottles/day- minimum.   The patient is medically ready for discharge to home and will need follow-up with [facility] PM[facility]. In addition, they will need to follow up with their PCP, Neurosurgery.     I spent a total of 31   minutes on total care today- >50% coordination of care- due to  D/w PA as well as pt about fluid intake and BP monitoring.    LOS: 20 days A FACE TO FACE EVALUATION WAS PERFORMED  [name] 05/12/2023, 8:20 AM

## 2023-05-12 NOTE — Progress Notes (Signed)
Inpatient Rehabilitation Care Coordinator Discharge Note   Patient Details  Name: [name] MRN: [id number] Date of Birth: 1956/05/08   Discharge location: D/c to home with husband  Length of Stay: 19 days  Discharge activity level: Supervision to CGA  Home/community participation: limited  Patient response KG:MWNUUV Literacy - How often do you need to have someone help you when you read instructions, pamphlets, or other written material from your doctor or pharmacy?: Never  Patient response OZ:DGUYQI Isolation - How often do you feel lonely or isolated from those around you?: Never  Services provided included: MD, PT, RD, OT, RN, CM, Neuropsych, SW, Pharmacy, TR  Financial Services:  Field seismologist Utilized: Private Insurance [employer]  Choices offered to/list presented to: patient  Follow-up services arranged:  Home Health, DME Home Health Agency: [facility] for HHPT/OT/aide    DME : [employer] [facility] for 3in1 bSC    Patient response to transportation need: Is the patient able to respond to transportation needs?: Yes In the past 12 months, has lack of transportation kept you from medical appointments or from getting medications?: No In the past 12 months, has lack of transportation kept you from meetings, work, or from getting things needed for daily living?: No   Patient/Family verbalized understanding of follow-up arrangements:  Yes  Individual responsible for coordination of the follow-up plan: contact pt [phone]  Confirmed correct DME delivered: Gretchen Short 05/12/2023    Comments (or additional information):fam edu completed  Summary of Stay    Date/Time Discharge Planning CSW  05/05/23 0844 DC plan remains home with husband who can provide PRN-FMLA paperwork completed for him and given to pt along with her own. Family education completed and will need to order DME and follow up for DC 11/1 RGD  04/30/23 1324 D/c plan remains to be home  with her husband who will provide PRN support as he works. Assistance from adult son with IDD who will help as well. Pt husband likely to take time off from work. Fam edu completed on 10/28 1pm-4pm with pt husband and son. SW will confirm there are no barriers to discharge. AAC  04/27/23 1306 D/c to home with her husband who will provide PRN support as he works. Assistance from adult son with IDD who will help as well. Pt husband likely to take time off from work. SW will confirm there are no barriers to discharge. AAC       [name] [name]

## 2023-05-20 ENCOUNTER — Other Ambulatory Visit (HOSPITAL_COMMUNITY): Payer: Self-pay

## 2023-05-27 ENCOUNTER — Ambulatory Visit: Payer: Medicare HMO | Admitting: Neurosurgery

## 2023-06-15 ENCOUNTER — Encounter: Payer: Self-pay | Admitting: Physical Medicine and Rehabilitation

## 2023-06-15 ENCOUNTER — Encounter: Payer: Medicare HMO | Attending: Physical Medicine and Rehabilitation | Admitting: Physical Medicine and Rehabilitation

## 2023-06-15 VITALS — BP 156/80 | HR 98 | Ht 67.0 in | Wt 130.0 lb

## 2023-06-15 DIAGNOSIS — R252 Cramp and spasm: Secondary | ICD-10-CM | POA: Diagnosis not present

## 2023-06-15 DIAGNOSIS — G8250 Quadriplegia, unspecified: Secondary | ICD-10-CM | POA: Insufficient documentation

## 2023-06-15 DIAGNOSIS — S14109D Unspecified injury at unspecified level of cervical spinal cord, subsequent encounter: Secondary | ICD-10-CM | POA: Diagnosis present

## 2023-06-15 DIAGNOSIS — I951 Orthostatic hypotension: Secondary | ICD-10-CM | POA: Insufficient documentation

## 2023-06-15 MED ORDER — MIDODRINE HCL 10 MG PO TABS: 15.0000 mg | ORAL_TABLET | Freq: Three times a day (TID) | ORAL | 5 refills | Status: DC

## 2023-06-15 MED ORDER — FLUDROCORTISONE ACETATE 0.1 MG PO TABS: 0.2000 mg | ORAL_TABLET | Freq: Every day | ORAL | 5 refills | Status: DC

## 2023-06-15 MED ORDER — CYCLOBENZAPRINE HCL 10 MG PO TABS: 10.0000 mg | ORAL_TABLET | Freq: Every day | ORAL | 5 refills | Status: DC

## 2023-06-15 NOTE — Progress Notes (Signed)
Subjective:    Patient ID: [name], female    DOB: 1955-12-15, 66 y.o.   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   Hurt L knee when fell.    Husband has caught her on EOB before, but didn't catch this AM.    B 156/80- HR 102.   Heart was racing yesterday and so stayed in bed all day.  Feels better this AM.   Didn't check it-   Bp been pretty good- checking it- 150s- and drops to 110-115 when stands up- and was as low initially as 90/50s-   Still taking Florinef and Midodrine-   Doesn't finish the meal- finished 25-50% of meal- tries to eat, but feels full.  Has lost 20 lbs.     Taking Bayer ASA at night-  325 mg at bedtime-  Helping pain- no prescription pain meds.    Shoulder blades and back feels tight!-  Tries to give back massage (husband) nightly-  Massage sometimes helps and sometimes hurts too much.   Having Bms daily usually-  Peeing OK- no issues.   Not taking meds for spasticity since baclofen made her so sleepy in hospital. .    Waiting in skin cancer? Results waiting for. .   Not taking muscle relaxant.   Finished homehealth therapies-    Social Hx:  Works doing maintenance-wants to get back to work.      Pain Inventory Average Pain 1 Pain Right Now 5 My pain is constant, sharp, and stabbing  LOCATION OF PAIN  left knee  BOWEL Number of stools per week: 2-3 Oral laxative use Yes  Type of laxative Mirlax   BLADDER Normal    Mobility use a walker ability to climb steps?  no do you drive?  no Do you have any goals in this area?   yes  Function employed # of hrs/week on medical leave  Neuro/Psych trouble walking  Prior Studies Any changes since last visit?  no  Physicians involved in your care Any changes since last visit?  no   Family History  Problem Relation Age of Onset   Breast cancer Maternal Aunt    Hypertension Mother    Hypertension Father    Diabetes Father    Social History   Socioeconomic History   Marital status: Married    Spouse name: Not on file   Number of children: Not on file   Years of education: Not on file   Highest education level: Not on file  Occupational History   Not on file  Tobacco Use   Smoking status: Never   Smokeless tobacco: Never  Vaping Use   Vaping status: Never Used  Substance and Sexual Activity   Alcohol use: No   Drug use: No   Sexual activity: Not Currently  Other Topics Concern   Not on file  Social History Narrative   Not on file   Social Determinants of Health   Financial Resource Strain: Not on file  Food Insecurity: No Food Insecurity (04/13/2023)   Hunger Vital Sign    Worried About Running  Out of Food in the Last Year: Never true    Ran Out of Food in the Last Year: Never true  Transportation Needs: No Transportation Needs (04/13/2023)   PRAPARE - Administrator, Civil Service (Medical): No    Lack of Transportation (Non-Medical): No  Physical Activity: Not on file  Stress: Not on file  Social Connections: Not on file   Past Surgical History:  Procedure Laterality Date   ABDOMINAL HYSTERECTOMY     BILATERAL SALPINGECTOMY Bilateral 03/16/2017   Procedure: BILATERAL SALPINGECTOMY;  Surgeon: Schermerhorn, Ihor Austin, MD;  Location: [facility] ORS;  Service: Gynecology;  Laterality: Bilateral;   BIOPSY  05/03/2023   Procedure: BIOPSY;  Surgeon: Benancio Deeds, MD;  Location: [facility] ENDOSCOPY;  Service: Gastroenterology;;   COLONOSCOPY WITH PROPOFOL N/A 08/18/2018   Procedure: COLONOSCOPY WITH PROPOFOL;  Surgeon: Scot Jun, MD;  Location: [facility] ENDOSCOPY;  Service: Endoscopy;  Laterality: N/A;   CYSTOCELE REPAIR N/A 03/16/2017   Procedure: ANTERIOR REPAIR (CYSTOCELE);  Surgeon: Schermerhorn, Ihor Austin, MD;  Location: [facility] ORS;  Service: Gynecology;  Laterality: N/A;   ESOPHAGOGASTRODUODENOSCOPY (EGD) WITH PROPOFOL N/A 08/18/2018   Procedure: ESOPHAGOGASTRODUODENOSCOPY (EGD) WITH PROPOFOL;  Surgeon: Scot Jun, MD;  Location: [facility] ENDOSCOPY;  Service: Endoscopy;  Laterality: N/A;   ESOPHAGOGASTRODUODENOSCOPY (EGD) WITH PROPOFOL Left 05/01/2023   Procedure: ESOPHAGOGASTRODUODENOSCOPY (EGD) WITH PROPOFOL;  Surgeon: Benancio Deeds, MD;  Location: [facility] ENDOSCOPY;  Service: Gastroenterology;  Laterality: Left;   ESOPHAGOGASTRODUODENOSCOPY (EGD) WITH PROPOFOL N/A 05/03/2023   Procedure: ESOPHAGOGASTRODUODENOSCOPY (EGD) WITH PROPOFOL;  Surgeon: Benancio Deeds, MD;  Location: [facility] ENDOSCOPY;  Service: Gastroenterology;  Laterality: N/A;   TEAR DUCT PROBING Right    TUBAL LIGATION     VAGINAL HYSTERECTOMY N/A 03/16/2017   Procedure: HYSTERECTOMY VAGINAL;  Surgeon: Schermerhorn, Ihor Austin, MD;  Location: [facility] ORS;  Service: Gynecology;  Laterality: N/A;   WISDOM TOOTH EXTRACTION     Past Medical History:  Diagnosis Date   Arthritis    Blood dyscrasia    patient stated she is a "free bleeder"   Diabetes mellitus without complication (HCC)    Diverticulosis    Hypothyroidism    Neuropathy    Renal insufficiency    Ht 5\' 7"  (1.702 m)   Wt 130 lb (59 kg)   BMI 20.36 kg/m   Opioid Risk Score:   Fall Risk Score:  `1  Depression screen PHQ 2/9      No data to display          Review of Systems  Musculoskeletal:  Positive for gait problem.       Left knee pain  All other systems reviewed and are negative.      Objective:   Physical Exam  BP 156/80 and HR 102-  Awake,alert, appropriate, has lost 20 lbs- sititng up on bedside table; husband in room; using RW to walk; NAD MS: RUE_ deltoid  4-/5; biceps 4+/5; triceps 4+/5; WE  5-/5 grip 4+/5 FA 4/5 LUE- deltoid 4+/5; Biceps 4+/5; triceps 5-/5; WE 4+/5; grip 4+/5 slightly less than R; FA 4-/5  RLE- HF 4+/5; KE/KF 4+/5; DF and PF 4+ LLE- HF 4/5; KE/KF 4+/5 and DF/PF 4+/5    Little swollen medially on L knee- but no abrasions.  Neuro: Hoffman's B/L -brisk Clonus LLE and more stiff/tight RLE MAS of 1 In LE's- no increased tone in Ue's- B/L   Gait- antalgic gait- since hurt L knee only checked a few steps, so hard ot assess  dynamic tone/spasticity.   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 5 refills  2. Con't Midodrine 15 mg 3x/day when wakes up; around lunch and afternoon 4 hours later- 145- 5 refills.    3. Call PCP - about [facility] and see if can come off it. Since appetite so poor or decrease dose. .     4.  Robaxin/Methocarbamol- 500 mg 3x/day as needed - % refills- cannot use flexeril- used in inpt rehab- and too sedating for her ,so cannot use. Needs a less sedating muscle relaxant.  Not covered by insurance.  Only choices are flexeril and zanaflex- will try Flexeril ?Cyclobenzaprine 10 mg nightly as needed for muscle spasms. If that's ddoesn't work, will try Zanaflex low dose nightly.   5. No baclofen since made so sedated in hospital- knocked out for 48 hours.    6. I'm ok with Aspirin but don't take higher dose.    7.  Cannot answer about when OK to return to work. Ask Neurosurgeon. I don't think she's ready to go to work yet.    8. F/U in 3 months-  double appt. SCI   9. Will send PT outpt - at  [facility]- and if need be, can ask for OT.    I spent a total of  42  minutes on total care today- >50% coordination of care- due to d/w pt about H/H; outpt therapies; return to work; pain meds- spasticity/muscle spasms; and orthostatic hypotension.

## 2023-06-15 NOTE — Patient Instructions (Addendum)
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 5 refills  2. Con't Midodrine 15 mg 3x/day when wakes up; around lunch and afternoon 4 hours later- 145- 5 refills.    3. Call PCP - about [facility] and see if can come off it. Since appetite so poor or decrease dose. .     4.  Robaxin/Methocarbamol- 500 mg 3x/day as needed - % refills- cannot use flexeril- used in inpt rehab- and too sedating for her ,so cannot use. Needs a less sedating muscle relaxant.  Not covered by insurance.  Only choices are flexeril and zanaflex- will try Flexeril ?Cyclobenzaprine 10 mg nightly as needed for muscle spasms. If that's ddoesn't work, will try Zanaflex low dose nightly.   5. No baclofen since made so sedated in hospital- knocked out for 48 hours.    6. I'm ok with Aspirin but don't take higher dose.    7.  Cannot answer about when OK to return to work. Ask Neurosurgeon. I don't think she's ready to go to work yet.    8. F/U in 3 months-    9. Will send PT outpt - at [facility]- and if need be, can ask for OT. - [place] [facility] .

## 2023-06-17 ENCOUNTER — Ambulatory Visit: Payer: Medicare HMO | Admitting: Neurosurgery

## 2023-06-17 ENCOUNTER — Ambulatory Visit
Admission: RE | Admit: 2023-06-17 | Discharge: 2023-06-17 | Disposition: A | Discharge status: Home / Self Care | Payer: Medicare HMO | Attending: Physician Assistant | Admitting: Physician Assistant

## 2023-06-17 ENCOUNTER — Encounter: Payer: Self-pay | Admitting: Physician Assistant

## 2023-06-17 ENCOUNTER — Ambulatory Visit: Payer: Medicare HMO | Admitting: Physician Assistant

## 2023-06-17 ENCOUNTER — Ambulatory Visit
Admission: RE | Admit: 2023-06-17 | Discharge: 2023-06-17 | Disposition: A | Discharge status: Home / Self Care | Payer: Medicare HMO | Source: Ambulatory Visit | Attending: Physician Assistant | Admitting: Physician Assistant

## 2023-06-17 VITALS — BP 114/64 | Ht 67.0 in | Wt 130.0 lb

## 2023-06-17 DIAGNOSIS — M4802 Spinal stenosis, cervical region: Secondary | ICD-10-CM | POA: Diagnosis present

## 2023-06-17 DIAGNOSIS — S14105D Unspecified injury at C5 level of cervical spinal cord, subsequent encounter: Secondary | ICD-10-CM

## 2023-06-17 DIAGNOSIS — R29898 Other symptoms and signs involving the musculoskeletal system: Secondary | ICD-10-CM | POA: Diagnosis not present

## 2023-06-17 DIAGNOSIS — R200 Anesthesia of skin: Secondary | ICD-10-CM | POA: Diagnosis not present

## 2023-06-17 DIAGNOSIS — S14129D Central cord syndrome at unspecified level of cervical spinal cord, subsequent encounter: Secondary | ICD-10-CM | POA: Diagnosis not present

## 2023-06-17 DIAGNOSIS — W19XXXD Unspecified fall, subsequent encounter: Secondary | ICD-10-CM

## 2023-06-17 DIAGNOSIS — R202 Paresthesia of skin: Secondary | ICD-10-CM

## 2023-06-17 NOTE — Progress Notes (Signed)
Referring Physician:  Jerl Mina, MD [address] [place],  [place] [address]  Primary Physician:  Cheryl Mina, MD  History of Present Illness: 06/17/2023 [name] is here today for follow-up after suffering a fall 2 months ago in which she suffered a cervical spinal cord injury.  She states she continues to have tingling down her right arm and into her hand with associated weakness.  Her arm and hand still feels shaky and sore and she is concerned because she is right-hand dominant.  Her legs also continue to feel weak and she has been taking gabapentin for neuropathy.  She indicates new urinary urgency since her fall.  No saddle anesthesia.  No incontinence to bowel or bladder.  After her discharge completed rehab and therapy.  History of Present Illness: 04/13/2023 note from Cheryl Mcmurray, MD [name] is a 67 y.o. female who presents with the chief complaint of cervical spinal cord injury after a fall.  She was in her usual state of health until earlier today around 10:00 when she was helping a friend and had a fall backwards striking her head.  She does not remember the fall or whether or not she had a loss of consciousness.  She developed burning pain in bilateral upper extremities but right worse than left.  She also has some burning pain in her feet as well.  He has a history of diabetes and diabetic neuropathy.  Was presenting with a cervical collar.   She states that she has not noticed any worsening in her strength but does notice that her right arm and leg feel weak compared to the contralateral side.  She does state that this is new since her recent fall.      The symptoms are causing a significant impact on the patient's life.   Review of Systems:  A 10 point review of systems is negative, except for the pertinent positives and negatives detailed in the HPI.  Past Medical History: Past Medical History:  Diagnosis Date   Arthritis     Blood dyscrasia    patient stated she is a "free bleeder"   Diabetes mellitus without complication (HCC)    Diverticulosis    Hypothyroidism    Neuropathy    Renal insufficiency     Past Surgical History: Past Surgical History:  Procedure Laterality Date   ABDOMINAL HYSTERECTOMY     BILATERAL SALPINGECTOMY Bilateral 03/16/2017   Procedure: BILATERAL SALPINGECTOMY;  Surgeon: Schermerhorn, Ihor Austin, MD;  Location: [facility] ORS;  Service: Gynecology;  Laterality: Bilateral;   BIOPSY  05/03/2023   Procedure: BIOPSY;  Surgeon: Cheryl Deeds, MD;  Location: [facility] ENDOSCOPY;  Service: Gastroenterology;;   COLONOSCOPY WITH PROPOFOL N/A 08/18/2018   Procedure: COLONOSCOPY WITH PROPOFOL;  Surgeon: Cheryl Jun, MD;  Location: [facility] ENDOSCOPY;  Service: Endoscopy;  Laterality: N/A;   CYSTOCELE REPAIR N/A 03/16/2017   Procedure: ANTERIOR REPAIR (CYSTOCELE);  Surgeon: Schermerhorn, Ihor Austin, MD;  Location: [facility] ORS;  Service: Gynecology;  Laterality: N/A;   ESOPHAGOGASTRODUODENOSCOPY (EGD) WITH PROPOFOL N/A 08/18/2018   Procedure: ESOPHAGOGASTRODUODENOSCOPY (EGD) WITH PROPOFOL;  Surgeon: Cheryl Jun, MD;  Location: [facility] ENDOSCOPY;  Service: Endoscopy;  Laterality: N/A;   ESOPHAGOGASTRODUODENOSCOPY (EGD) WITH PROPOFOL Left 05/01/2023   Procedure: ESOPHAGOGASTRODUODENOSCOPY (EGD) WITH PROPOFOL;  Surgeon: Cheryl Deeds, MD;  Location: [facility] ENDOSCOPY;  Service: Gastroenterology;  Laterality: Left;   ESOPHAGOGASTRODUODENOSCOPY (EGD) WITH PROPOFOL N/A 05/03/2023   Procedure: ESOPHAGOGASTRODUODENOSCOPY (EGD) WITH PROPOFOL;  Surgeon: Cheryl Patrick  P, MD;  Location: [facility] ENDOSCOPY;  Service: Gastroenterology;  Laterality: N/A;   TEAR DUCT PROBING Right    TUBAL LIGATION     VAGINAL HYSTERECTOMY N/A 03/16/2017   Procedure: HYSTERECTOMY VAGINAL;  Surgeon: Schermerhorn, Ihor Austin, MD;  Location: [facility] ORS;  Service: Gynecology;  Laterality: N/A;   WISDOM TOOTH EXTRACTION       Allergies: Allergies as of 06/17/2023 - Review Complete 06/17/2023  Allergen Reaction Noted   Amoxicillin-pot clavulanate Itching and Anaphylaxis 08/11/2021   Ampicillin Rash 10/28/2022    Medications: Outpatient Encounter Medications as of 06/17/2023  Medication Sig   acetaminophen (TYLENOL) 325 MG tablet Take 2 tablets (650 mg total) by mouth every 4 (four) hours as needed for mild pain (pain score 1-3) (temp > 101.5).   cholecalciferol (VITAMIN D3) 25 MCG (1000 UNIT) tablet Take 1,000 Units by mouth daily.   cyclobenzaprine (FLEXERIL) 10 MG tablet Take 1 tablet (10 mg total) by mouth at bedtime.   diclofenac Sodium (VOLTAREN) 1 % GEL Apply 2 g topically 4 (four) times daily.   docusate sodium (COLACE) 100 MG capsule Take 1 capsule (100 mg total) by mouth at bedtime.   fludrocortisone (FLORINEF) 0.1 MG tablet Take 2 tablets (0.2 mg total) by mouth daily. For orthostatic hypotension   gabapentin (NEURONTIN) 100 MG capsule Take 200 mg by mouth 2 (two) times daily.    glipiZIDE (GLUCOTROL) 5 MG tablet Take by mouth daily before breakfast.   JARDIANCE 25 MG TABS tablet Take 25 mg by mouth daily.   lactase (LACTAID) 3000 units tablet Take 3,000 Units by mouth daily before breakfast.   levothyroxine (SYNTHROID) 88 MCG tablet Take 88 mcg by mouth daily before breakfast.   metoCLOPramide (REGLAN) 10 MG tablet Take 1 tablet (10 mg total) by mouth every 8 (eight) hours as needed for nausea or vomiting.   midodrine (PROAMATINE) 10 MG tablet Take 1.5 tablets (15 mg total) by mouth 3 (three) times daily with meals. For orthostatic hypotension   Multiple Vitamin (MULTIVITAMIN WITH MINERALS) TABS tablet Take 1 tablet by mouth daily.   ondansetron (ZOFRAN-ODT) 4 MG disintegrating tablet Take 1 tablet (4 mg total) by mouth every 6 (six) hours as needed for nausea or vomiting.   pantoprazole (PROTONIX) 40 MG tablet Take 1 tablet (40 mg total) by mouth daily.   pioglitazone (ACTOS) 30 MG tablet Take  30 mg by mouth daily.   polyethylene glycol (MIRALAX / GLYCOLAX) 17 g packet Take 17 g by mouth daily as needed for moderate constipation.   psyllium (REGULOID) 0.52 g capsule Take 0.52 g by mouth daily.   rosuvastatin (CRESTOR) 5 MG tablet Take 5 mg by mouth daily.   senna (SENOKOT) 8.6 MG TABS tablet Take 1 tablet (8.6 mg total) by mouth at bedtime.   vitamin B-12 (CYANOCOBALAMIN) 100 MCG tablet Take 100 mcg by mouth daily.   [DISCONTINUED] levothyroxine (SYNTHROID) 100 MCG tablet Take 100 mcg by mouth daily before breakfast.   No facility-administered encounter medications on file as of 06/17/2023.    Social History: Social History   Tobacco Use   Smoking status: Never   Smokeless tobacco: Never  Vaping Use   Vaping status: Never Used  Substance Use Topics   Alcohol use: No   Drug use: No    Family Medical History: Family History  Problem Relation Age of Onset   Breast cancer Maternal Aunt    Hypertension Mother    Hypertension Father    Diabetes Father  Physical Examination: @VITALWITHPAIN @  General: Patient is well developed, well nourished, calm, collected, and in no apparent distress. Attention to examination is appropriate.  Psychiatric: Patient is non-anxious.  Head:  Pupils equal, round, and reactive to light.  ENT:  Oral mucosa appears well hydrated.  Neck:   Supple.  Decreased range of motion  Respiratory: Patient is breathing without any difficulty.  Extremities: No edema.  Vascular: Palpable dorsal pedal pulses.  Skin:   On exposed skin, there are no abnormal skin lesions.  NEUROLOGICAL:     Awake, alert, oriented to person, place, and time.  Speech is clear and fluent. Fund of knowledge is appropriate.   Cranial Nerves: Pupils equal round and reactive to light.  Facial tone is symmetric.  Facial sensation is symmetric.  ROM of spine: some tenderness to palpation of cervical spine Strength:  Notable weakness in right hand grip and  intrinsic finger function at a 3 out of 5.  Patient does have difficulty with full range of motion right upper extremity which does make the exam more difficult but is 4+ to confrontation.   Reflexes are 2+ at biceps and brachioradialis. Hoffman's  Decrease tingling sensation of right upper extremity Gait is abnormal.  Patient using walker.  Medical Decision Making  Imaging: MR Cervical spine, 04/12/20: IMPRESSION: 1. No acute traumatic finding by MRI. No fracture or evidence of ligamentous injury. 2. Severe spinal stenosis at C2-3 and C3-4 due to endplate osteophytes, bulging of the disc and posterior ligamentous prominence. AP diameter of the canal in the midline as narrow as 4.5- 4.7 mm. Effacement of the subarachnoid space and deformity of the cord. Patient would be at risk of compressive myelopathy. Bilateral foraminal stenosis also present at these levels. 3. Moderate spinal stenosis at C4-5 and C5-6 due to endplate osteophytes, bulging of the disc and posterior ligamentous prominence. Narrowing of the subarachnoid space with slight indentation of the cord at C4-5. Bilateral foraminal stenosis at C4-5   I have personally reviewed the images and agree with the above interpretation.  Assessment and Plan: [name] is a pleasant 67 y.o. female is here today for follow-up after suffering a fall 2 months ago in which she suffered a cervical spinal cord injury, mixed subtype of central cord versus hemicord.  Longstanding previous of cervical stenosis which puts her at risk of cervical central cord injury Hospital she states she continues to have tingling down her right arm and into her hand with associated weakness.  Her arm and hand still feels shaky and sore and she is concerned because she is right-hand dominant.  Her legs also continue to feel weak and she has been taking gabapentin for neuropathy.  She indicates new urinary urgency since her fall.  No saddle anesthesia.  No  incontinence to bowel or bladder.  Patient was delayed surgical treatment due to other medical issues and request from patient and family.  At this point patient does need decompression of her cervical spine and consideration of Lami versus fusion was discussed with patient and her family.  Plan for cervical x-rays today including flexion-extension for further review.  Once complete we will review x-rays to determine exact plan.    Thank you for involving me in the care of this patient.   I spent a total of 60 minutes in both face-to-face and non-face-to-face activities for this visit on the date of this encounter.   Joan Flores, PA-C Dept. of Neurosurgery   Addendum  I came in to see  this patient after Joan Flores, PA-C had done her evaluation.  Given her severe cervical stenosis with previous cervical spinal cord injury she was offered surgery in the inpatient setting but elected to defer for a delayed intervention.  She is here today for follow-up.  She continues to have severe cervical stenosis.  We obtained flexion-extension x-rays which did not show any gross instability.  Given these findings we can plan for a cervical laminoplasty from C3-C 6, with a C2 dome laminectomy.  Patient has recently lost some weight due to a peptide inhibitor, however she has recently stopped and is regaining her appetite.  We did discuss with her the importance of improving her diet and nutrition and how important that will be for her healing.  Will plan to move forward with the procedure given the tenuous nature of her spinal cord injury.

## 2023-06-18 DIAGNOSIS — R29898 Other symptoms and signs involving the musculoskeletal system: Secondary | ICD-10-CM | POA: Insufficient documentation

## 2023-06-18 DIAGNOSIS — S14105A Unspecified injury at C5 level of cervical spinal cord, initial encounter: Secondary | ICD-10-CM | POA: Insufficient documentation

## 2023-06-18 DIAGNOSIS — R200 Anesthesia of skin: Secondary | ICD-10-CM | POA: Insufficient documentation

## 2023-06-18 DIAGNOSIS — M4802 Spinal stenosis, cervical region: Secondary | ICD-10-CM | POA: Insufficient documentation

## 2023-06-23 ENCOUNTER — Telehealth: Payer: Self-pay

## 2023-06-23 DIAGNOSIS — R29898 Other symptoms and signs involving the musculoskeletal system: Secondary | ICD-10-CM

## 2023-06-23 DIAGNOSIS — M4802 Spinal stenosis, cervical region: Secondary | ICD-10-CM

## 2023-06-23 DIAGNOSIS — S14105D Unspecified injury at C5 level of cervical spinal cord, subsequent encounter: Secondary | ICD-10-CM

## 2023-06-23 DIAGNOSIS — R200 Anesthesia of skin: Secondary | ICD-10-CM

## 2023-06-23 NOTE — Telephone Encounter (Incomplete)
I spoke with Mrs Bowman. She states she is switching to [facility] for 2025 and she will stop by tomorrow morning to give [place] a copy of the card and to pick up a copy of this information. We discussed the following:  Planned surgery: C3-6 laminoplasty, C2 dome laminectomy   Surgery date: 07/14/23 at [facility]: [address], [place], [place] [address]) - you will find out your arrival time the business day before your surgery.   Pre-op appointment at [facility] Pre-admit Testing: we will call you with a date/time for this. If you are scheduled for an in person appointment, Pre-admit Testing is located on the first floor of the [facility] building, [address], [address]. Please bring all prescriptions in the original prescription bottles to your appointment. During this appointment, they will advise you which medications you can take the morning of surgery, and which medications you will need to hold for surgery. Labs (such as blood work, EKG) may be done at your pre-op appointment. You are not required to fast for these labs. Should you need to change your pre-op appointment, please call Pre-admit testing at [phone].     Diabetes/weight loss medications:  Jardiance - hold 3 days prior to surgery    Surgical clearance: we will send a clearance form to Dr Burnett Sheng. They may wish to see you in their office prior to signing the clearance form. If so, they may call you to schedule an appointment.     Common restrictions after surgery: No bending, lifting, or twisting ("BLT"). Avoid lifting objects heavier than 10 pounds for the first 6 weeks after surgery. Where possible, avoid household activities that involve lifting, bending, reaching, pushing, or pulling such as laundry, vacuuming, grocery shopping, and childcare. Try to arrange for help from friends and family for these activities while you heal. Do not drive while taking prescription  pain medication. Weeks 6 through 12 after surgery: avoid lifting more than 25 pounds.     How to contact us:  If you have any questions/concerns before or after surgery, you can reach [place] at [phone], or you can send a mychart message. We can be reached by phone or mychart 8am-4pm, Monday-Friday.  *Please note: Calls after 4pm are forwarded to a third party answering service. Mychart messages are not routinely monitored during evenings, weekends, and holidays. Please call our office to contact the answering service for urgent concerns during non-business hours.   If you have FMLA/disability paperwork, please drop it off or fax it to [phone], attention Patty.   Appointments/FMLA & disability paperwork: Joycelyn Rua, & Flonnie Hailstone Registered Nurse/Surgery scheduler: Royston Cowper Medical Assistants: Nash Mantis Physician Assistants: Joan Flores, PA-C, Manning Charity, PA-C & Drake Leach, PA-C Surgeons: Venetia Night, MD & Ernestine Mcmurray, MD

## 2023-06-24 ENCOUNTER — Other Ambulatory Visit: Payer: Self-pay

## 2023-06-24 DIAGNOSIS — Z01818 Encounter for other preprocedural examination: Secondary | ICD-10-CM

## 2023-06-25 NOTE — Telephone Encounter (Signed)
Patient has received information, new insurance card in chart

## 2023-07-06 ENCOUNTER — Other Ambulatory Visit: Payer: Self-pay

## 2023-07-06 ENCOUNTER — Encounter
Admission: RE | Admit: 2023-07-06 | Discharge: 2023-07-06 | Disposition: A | Discharge status: Home / Self Care | Payer: Medicare HMO | Source: Ambulatory Visit | Attending: Neurosurgery | Admitting: Neurosurgery

## 2023-07-06 VITALS — BP 104/81 | HR 90 | Temp 97.8°F | Resp 18 | Ht 67.0 in | Wt 129.9 lb

## 2023-07-06 DIAGNOSIS — Z01818 Encounter for other preprocedural examination: Secondary | ICD-10-CM

## 2023-07-06 DIAGNOSIS — E119 Type 2 diabetes mellitus without complications: Secondary | ICD-10-CM | POA: Insufficient documentation

## 2023-07-06 DIAGNOSIS — Z01812 Encounter for preprocedural laboratory examination: Secondary | ICD-10-CM | POA: Diagnosis not present

## 2023-07-06 DIAGNOSIS — Z22322 Carrier or suspected carrier of Methicillin resistant Staphylococcus aureus: Secondary | ICD-10-CM

## 2023-07-06 HISTORY — DX: Unspecified cord compression: G95.20

## 2023-07-06 HISTORY — DX: Chronic kidney disease, stage 3a: N18.31

## 2023-07-06 HISTORY — DX: Dental caries, unspecified: K02.9

## 2023-07-06 HISTORY — DX: Cellulitis of face: L03.211

## 2023-07-06 HISTORY — DX: Central cord syndrome at unspecified level of cervical spinal cord, initial encounter: S14.129A

## 2023-07-06 HISTORY — DX: Unspecified injury at unspecified level of cervical spinal cord, initial encounter: S14.109A

## 2023-07-06 HISTORY — DX: Fracture of unspecified parts of lumbosacral spine and pelvis, initial encounter for closed fracture: S32.9XXA

## 2023-07-06 HISTORY — DX: Dysphagia, unspecified: R13.10

## 2023-07-06 HISTORY — DX: Gastro-esophageal reflux disease without esophagitis: K21.9

## 2023-07-06 HISTORY — DX: Quadriplegia, unspecified: G82.50

## 2023-07-06 HISTORY — DX: Acute kidney failure, unspecified: N17.9

## 2023-07-06 HISTORY — DX: Acute respiratory failure with hypoxia: J96.01

## 2023-07-06 HISTORY — DX: Esophageal obstruction: K22.2

## 2023-07-06 LAB — SURGICAL PCR SCREEN
MRSA, PCR: POSITIVE — AB
Staphylococcus aureus: POSITIVE — AB

## 2023-07-06 LAB — TYPE AND SCREEN
ABO/RH(D): A POS
Antibody Screen: NEGATIVE

## 2023-07-06 NOTE — Progress Notes (Unsigned)
Added order for vancomycin for pre-ob antibiotics (in addition to already ordered ancef) due to +MRSA swab. Sent to Dr Katrinka Blazing for [place].

## 2023-07-06 NOTE — Patient Instructions (Addendum)
Your procedure is scheduled on: Tuesday January 7  Report to the Registration Desk on the 1st floor of the [facility] To find out your arrival time, please call [phone] between 1PM - 3PM on:  Monday January 6  If your arrival time is 6:00 am, do not arrive before that time as the [facility] entrance doors do not open until 6:00 am.  REMEMBER: Instructions that are not followed completely may result in serious medical risk, up to and including death; or upon the discretion of your surgeon and anesthesiologist your surgery may need to be rescheduled.  Do not eat food after midnight the night before surgery.  No gum chewing or hard candies.  You may however, drink WATER up to 2 hours before you are scheduled to arrive for your surgery. Do not drink anything within 2 hours of your scheduled arrival time.  One week prior to surgery: Tuesday December 31  Stop Anti-inflammatories (NSAIDS) such as Advil, Aleve, Ibuprofen, Motrin, Naproxen, Naprosyn and Aspirin based products such as Excedrin, Goody's Powder, BC Powder. Stop ANY OVER THE COUNTER supplements until after surgery.  You may however, continue to take Tylenol if needed for pain up until the day of surgery.  **Follow guidelines for insulin and diabetes medications.** JARDIANCE Dr. Katrinka Blazing request hold for 3 days prior to surgery, last dose Friday January 3  gabapentin (NEURONTIN) do not take the morning of surgery, last dose Monday January 6  **Follow recommendations regarding stopping blood thinners.**  Continue taking all of your other prescription medications up until the day of surgery.  ON THE DAY OF SURGERY ONLY TAKE THESE MEDICATIONS WITH SIPS OF WATER:  cyanocobalamin (VITAMIN B12)  fludrocortisone (FLORINEF) gabapentin (NEURONTIN)  levothyroxine (SYNTHROID)  midodrine (PROAMATINE)  Multiple Vitamin (MULTIVITAMIN WITH MINERALS)    No Alcohol for 24 hours before or after surgery.  No Smoking including  e-cigarettes for 24 hours before surgery.  No chewable tobacco products for at least 6 hours before surgery.  No nicotine patches on the day of surgery.  Do not use any "recreational" drugs for at least a week (preferably 2 weeks) before your surgery.  Please be advised that the combination of cocaine and anesthesia may have negative outcomes, up to and including death. If you test positive for cocaine, your surgery will be cancelled.  On the morning of surgery brush your teeth with toothpaste and water, you may rinse your mouth with mouthwash if you wish. Do not swallow any toothpaste or mouthwash.  Use CHG Soap as directed on instruction sheet.  Do not wear jewelry, make-up, hairpins, clips or nail polish.  For welded (permanent) jewelry: bracelets, anklets, waist bands, etc.  Please have this removed prior to surgery.  If it is not removed, there is a chance that hospital personnel will need to cut it off on the day of surgery.  Do not wear lotions, powders, or perfumes.   Do not shave body hair from the neck down 48 hours before surgery.  Contact lenses, hearing aids and dentures may not be worn into surgery.  Do not bring valuables to the hospital. [facility] is not responsible for any missing/lost belongings or valuables.   Notify your doctor if there is any change in your medical condition (cold, fever, infection).  Wear comfortable clothing (specific to your surgery type) to the hospital.  After surgery, you can help prevent lung complications by doing breathing exercises.  Take deep breaths and cough every 1-2 hours.   If  you are being admitted to the hospital overnight, leave your suitcase in the car. After surgery it may be brought to your room.  In case of increased patient census, it may be necessary for you, the patient, to continue your postoperative care in the Same Day Surgery department.  If you are being discharged the day of surgery, you will not be allowed to  drive home. You will need a responsible individual to drive you home and stay with you for 24 hours after surgery.   If you are taking public transportation, you will need to have a responsible individual with you.  Please call the Pre-admissions Testing Dept. at [phone] if you have any questions about these instructions.  Surgery Visitation Policy:  Patients having surgery or a procedure may have two visitors.  Children under the age of 42 must have an adult with them who is not the patient.  Inpatient Visitation:    Visiting hours are 7 a.m. to 8 p.m. Up to four visitors are allowed at one time in a patient room. The visitors may rotate out with other people during the day.  One visitor age 31 or older may stay with the patient overnight and must be in the room by 8 p.m.       Pre-operative 5 CHG Bath Instructions   You can play a key role in reducing the risk of infection after surgery. Your skin needs to be as free of germs as possible. You can reduce the number of germs on your skin by washing with CHG (chlorhexidine gluconate) soap before surgery. CHG is an antiseptic soap that kills germs and continues to kill germs even after washing.   DO NOT use if you have an allergy to chlorhexidine/CHG or antibacterial soaps. If your skin becomes reddened or irritated, stop using the CHG and notify one of our RNs at [phone].   Please shower with the CHG soap starting 4 days before surgery using the following schedule:     Please keep in mind the following:  DO NOT shave, including legs and underarms, starting the day of your first shower.   You may shave your face at any point before/day of surgery.  Place clean sheets on your bed the day you start using CHG soap. Use a clean washcloth (not used since being washed) for each shower. DO NOT sleep with pets once you start using the CHG.   CHG Shower Instructions:  If you choose to wash your hair and private area, wash  first with your normal shampoo/soap.  After you use shampoo/soap, rinse your hair and body thoroughly to remove shampoo/soap residue.  Turn the water OFF and apply about 3 tablespoons (45 ml) of CHG soap to a CLEAN washcloth.  Apply CHG soap ONLY FROM YOUR NECK DOWN TO YOUR TOES (washing for 3-5 minutes)  DO NOT use CHG soap on face, private areas, open wounds, or sores.  Pay special attention to the area where your surgery is being performed.  If you are having back surgery, having someone wash your back for you may be helpful. Wait 2 minutes after CHG soap is applied, then you may rinse off the CHG soap.  Pat dry with a clean towel  Put on clean clothes/pajamas   If you choose to wear lotion, please use ONLY the CHG-compatible lotions on the back of this paper.     Additional instructions for the day of surgery: DO NOT APPLY any lotions, deodorants, cologne, or perfumes.  Put on clean/comfortable clothes.  Brush your teeth.  Ask your nurse before applying any prescription medications to the skin.      CHG Compatible Lotions   Aveeno Moisturizing lotion  Cetaphil Moisturizing Cream  Cetaphil Moisturizing Lotion  Clairol Herbal Essence Moisturizing Lotion, Dry Skin  Clairol Herbal Essence Moisturizing Lotion, Extra Dry Skin  Clairol Herbal Essence Moisturizing Lotion, Normal Skin  Curel Age Defying Therapeutic Moisturizing Lotion with Alpha Hydroxy  Curel Extreme Care Body Lotion  Curel Soothing Hands Moisturizing Hand Lotion  Curel Therapeutic Moisturizing Cream, Fragrance-Free  Curel Therapeutic Moisturizing Lotion, Fragrance-Free  Curel Therapeutic Moisturizing Lotion, Original Formula  Eucerin Daily Replenishing Lotion  Eucerin Dry Skin Therapy Plus Alpha Hydroxy Crme  Eucerin Dry Skin Therapy Plus Alpha Hydroxy Lotion  Eucerin Original Crme  Eucerin Original Lotion  Eucerin Plus Crme Eucerin Plus Lotion  Eucerin TriLipid Replenishing Lotion  Keri Anti-Bacterial  Hand Lotion  Keri Deep Conditioning Original Lotion Dry Skin Formula Softly Scented  Keri Deep Conditioning Original Lotion, Fragrance Free Sensitive Skin Formula  Keri Lotion Fast Absorbing Fragrance Free Sensitive Skin Formula  Keri Lotion Fast Absorbing Softly Scented Dry Skin Formula  Keri Original Lotion  Keri Skin Renewal Lotion Keri Silky Smooth Lotion  Keri Silky Smooth Sensitive Skin Lotion  Nivea Body Creamy Conditioning Oil  Nivea Body Extra Enriched Teacher, adult education Moisturizing Lotion Nivea Crme  Nivea Skin Firming Lotion  NutraDerm 30 Skin Lotion  NutraDerm Skin Lotion  NutraDerm Therapeutic Skin Cream  NutraDerm Therapeutic Skin Lotion  ProShield Protective Hand Cream  Provon moisturizing lotion

## 2023-07-06 NOTE — Progress Notes (Signed)
Preop clearance not obtained by PCP Burnett Sheng) due to abnormal EKG. Dr Delia Chimes office sent asap referral to cardiology. Pt aware of this per note in Care Everywhere

## 2023-07-07 ENCOUNTER — Other Ambulatory Visit: Payer: Medicare HMO

## 2023-07-09 ENCOUNTER — Telehealth: Payer: Self-pay

## 2023-07-09 NOTE — Telephone Encounter (Signed)
 [name] stopped by the office to inform us  that she saw cardiology and she is now scheduled for a stress test on 07/17/23 and a follow up appointment on 07/22/23. Due to this, her surgery has been rescheduled to 07/28/23. Her post op appointments have been moved accordingly.

## 2023-07-22 ENCOUNTER — Inpatient Hospital Stay: Admission: RE | Admit: 2023-07-22 | Payer: Medicare HMO | Source: Ambulatory Visit

## 2023-07-24 ENCOUNTER — Telehealth: Payer: Self-pay

## 2023-07-24 NOTE — Telephone Encounter (Signed)
[name] called in to inquire about her upcoming surgery. I provided her with the name of the procedure. She asked for an explanation of what Dr Katrinka Blazing will be doing during surgery. I explained that he will cut a wedge into the bone to make more room for her spinal cord. She did not have any further questions.

## 2023-07-28 ENCOUNTER — Inpatient Hospital Stay: Payer: Medicare Other

## 2023-07-28 ENCOUNTER — Other Ambulatory Visit: Payer: Self-pay

## 2023-07-28 ENCOUNTER — Inpatient Hospital Stay: Payer: Medicare Other | Admitting: Urgent Care

## 2023-07-28 ENCOUNTER — Encounter
Admission: RE | Disposition: A | Discharge status: Nursing Home (Includes ICF) | Payer: Self-pay | Source: Ambulatory Visit | Attending: Neurosurgery

## 2023-07-28 ENCOUNTER — Encounter: Payer: Self-pay | Admitting: Neurosurgery

## 2023-07-28 ENCOUNTER — Inpatient Hospital Stay
Admission: RE | Admit: 2023-07-28 | Discharge: 2023-08-21 | DRG: 003 | Disposition: A | Discharge status: Other Acute Inpatient Hospital | Payer: Medicare Other | Source: Ambulatory Visit | Attending: Pulmonary Disease | Admitting: Pulmonary Disease

## 2023-07-28 DIAGNOSIS — E1165 Type 2 diabetes mellitus with hyperglycemia: Secondary | ICD-10-CM | POA: Diagnosis present

## 2023-07-28 DIAGNOSIS — G9761 Postprocedural hematoma of a nervous system organ or structure following a nervous system procedure: Secondary | ICD-10-CM | POA: Diagnosis not present

## 2023-07-28 DIAGNOSIS — R200 Anesthesia of skin: Secondary | ICD-10-CM | POA: Diagnosis not present

## 2023-07-28 DIAGNOSIS — Z8249 Family history of ischemic heart disease and other diseases of the circulatory system: Secondary | ICD-10-CM

## 2023-07-28 DIAGNOSIS — E1143 Type 2 diabetes mellitus with diabetic autonomic (poly)neuropathy: Secondary | ICD-10-CM | POA: Diagnosis present

## 2023-07-28 DIAGNOSIS — E1122 Type 2 diabetes mellitus with diabetic chronic kidney disease: Secondary | ICD-10-CM | POA: Diagnosis present

## 2023-07-28 DIAGNOSIS — R1310 Dysphagia, unspecified: Secondary | ICD-10-CM | POA: Diagnosis not present

## 2023-07-28 DIAGNOSIS — R29898 Other symptoms and signs involving the musculoskeletal system: Secondary | ICD-10-CM | POA: Diagnosis present

## 2023-07-28 DIAGNOSIS — E119 Type 2 diabetes mellitus without complications: Secondary | ICD-10-CM

## 2023-07-28 DIAGNOSIS — R202 Paresthesia of skin: Secondary | ICD-10-CM | POA: Diagnosis not present

## 2023-07-28 DIAGNOSIS — R001 Bradycardia, unspecified: Secondary | ICD-10-CM | POA: Diagnosis not present

## 2023-07-28 DIAGNOSIS — E875 Hyperkalemia: Secondary | ICD-10-CM | POA: Diagnosis present

## 2023-07-28 DIAGNOSIS — Z9079 Acquired absence of other genital organ(s): Secondary | ICD-10-CM

## 2023-07-28 DIAGNOSIS — E1142 Type 2 diabetes mellitus with diabetic polyneuropathy: Secondary | ICD-10-CM | POA: Diagnosis present

## 2023-07-28 DIAGNOSIS — G959 Disease of spinal cord, unspecified: Secondary | ICD-10-CM | POA: Diagnosis present

## 2023-07-28 DIAGNOSIS — M4714 Other spondylosis with myelopathy, thoracic region: Secondary | ICD-10-CM | POA: Diagnosis not present

## 2023-07-28 DIAGNOSIS — T148XXA Other injury of unspecified body region, initial encounter: Principal | ICD-10-CM

## 2023-07-28 DIAGNOSIS — J9589 Other postprocedural complications and disorders of respiratory system, not elsewhere classified: Secondary | ICD-10-CM | POA: Diagnosis not present

## 2023-07-28 DIAGNOSIS — E872 Acidosis, unspecified: Secondary | ICD-10-CM | POA: Diagnosis not present

## 2023-07-28 DIAGNOSIS — J9601 Acute respiratory failure with hypoxia: Secondary | ICD-10-CM | POA: Diagnosis present

## 2023-07-28 DIAGNOSIS — E440 Moderate protein-calorie malnutrition: Secondary | ICD-10-CM | POA: Diagnosis not present

## 2023-07-28 DIAGNOSIS — Z01812 Encounter for preprocedural laboratory examination: Secondary | ICD-10-CM

## 2023-07-28 DIAGNOSIS — W19XXXA Unspecified fall, initial encounter: Secondary | ICD-10-CM | POA: Diagnosis present

## 2023-07-28 DIAGNOSIS — J181 Lobar pneumonia, unspecified organism: Secondary | ICD-10-CM | POA: Diagnosis not present

## 2023-07-28 DIAGNOSIS — G9340 Encephalopathy, unspecified: Secondary | ICD-10-CM | POA: Diagnosis not present

## 2023-07-28 DIAGNOSIS — G928 Other toxic encephalopathy: Secondary | ICD-10-CM | POA: Diagnosis not present

## 2023-07-28 DIAGNOSIS — K222 Esophageal obstruction: Secondary | ICD-10-CM | POA: Diagnosis present

## 2023-07-28 DIAGNOSIS — Z930 Tracheostomy status: Secondary | ICD-10-CM | POA: Diagnosis not present

## 2023-07-28 DIAGNOSIS — Z22322 Carrier or suspected carrier of Methicillin resistant Staphylococcus aureus: Secondary | ICD-10-CM

## 2023-07-28 DIAGNOSIS — Z7984 Long term (current) use of oral hypoglycemic drugs: Secondary | ICD-10-CM

## 2023-07-28 DIAGNOSIS — N179 Acute kidney failure, unspecified: Secondary | ICD-10-CM | POA: Diagnosis not present

## 2023-07-28 DIAGNOSIS — Z01818 Encounter for other preprocedural examination: Secondary | ICD-10-CM

## 2023-07-28 DIAGNOSIS — Z794 Long term (current) use of insulin: Secondary | ICD-10-CM

## 2023-07-28 DIAGNOSIS — J90 Pleural effusion, not elsewhere classified: Secondary | ICD-10-CM | POA: Diagnosis not present

## 2023-07-28 DIAGNOSIS — E890 Postprocedural hypothyroidism: Secondary | ICD-10-CM | POA: Diagnosis present

## 2023-07-28 DIAGNOSIS — Z833 Family history of diabetes mellitus: Secondary | ICD-10-CM

## 2023-07-28 DIAGNOSIS — S14105D Unspecified injury at C5 level of cervical spinal cord, subsequent encounter: Secondary | ICD-10-CM | POA: Diagnosis not present

## 2023-07-28 DIAGNOSIS — Z8616 Personal history of COVID-19: Secondary | ICD-10-CM

## 2023-07-28 DIAGNOSIS — Z881 Allergy status to other antibiotic agents status: Secondary | ICD-10-CM

## 2023-07-28 DIAGNOSIS — S14109A Unspecified injury at unspecified level of cervical spinal cord, initial encounter: Secondary | ICD-10-CM | POA: Diagnosis present

## 2023-07-28 DIAGNOSIS — M4804 Spinal stenosis, thoracic region: Secondary | ICD-10-CM | POA: Diagnosis not present

## 2023-07-28 DIAGNOSIS — Z7189 Other specified counseling: Secondary | ICD-10-CM | POA: Diagnosis not present

## 2023-07-28 DIAGNOSIS — D631 Anemia in chronic kidney disease: Secondary | ICD-10-CM | POA: Diagnosis present

## 2023-07-28 DIAGNOSIS — S14152A Other incomplete lesion at C2 level of cervical spinal cord, initial encounter: Secondary | ICD-10-CM | POA: Diagnosis present

## 2023-07-28 DIAGNOSIS — G543 Thoracic root disorders, not elsewhere classified: Secondary | ICD-10-CM | POA: Diagnosis not present

## 2023-07-28 DIAGNOSIS — J690 Pneumonitis due to inhalation of food and vomit: Secondary | ICD-10-CM | POA: Diagnosis not present

## 2023-07-28 DIAGNOSIS — Z7952 Long term (current) use of systemic steroids: Secondary | ICD-10-CM

## 2023-07-28 DIAGNOSIS — S14109D Unspecified injury at unspecified level of cervical spinal cord, subsequent encounter: Secondary | ICD-10-CM | POA: Diagnosis not present

## 2023-07-28 DIAGNOSIS — R578 Other shock: Secondary | ICD-10-CM | POA: Diagnosis not present

## 2023-07-28 DIAGNOSIS — G9341 Metabolic encephalopathy: Secondary | ICD-10-CM | POA: Diagnosis not present

## 2023-07-28 DIAGNOSIS — E8721 Acute metabolic acidosis: Secondary | ICD-10-CM | POA: Diagnosis not present

## 2023-07-28 DIAGNOSIS — M4802 Spinal stenosis, cervical region: Secondary | ICD-10-CM | POA: Diagnosis not present

## 2023-07-28 DIAGNOSIS — R3915 Urgency of urination: Secondary | ICD-10-CM | POA: Diagnosis present

## 2023-07-28 DIAGNOSIS — R569 Unspecified convulsions: Secondary | ICD-10-CM | POA: Diagnosis not present

## 2023-07-28 DIAGNOSIS — D500 Iron deficiency anemia secondary to blood loss (chronic): Secondary | ICD-10-CM | POA: Diagnosis not present

## 2023-07-28 DIAGNOSIS — N1832 Chronic kidney disease, stage 3b: Secondary | ICD-10-CM | POA: Diagnosis present

## 2023-07-28 DIAGNOSIS — D62 Acute posthemorrhagic anemia: Secondary | ICD-10-CM | POA: Diagnosis not present

## 2023-07-28 DIAGNOSIS — Z90710 Acquired absence of both cervix and uterus: Secondary | ICD-10-CM

## 2023-07-28 DIAGNOSIS — E039 Hypothyroidism, unspecified: Secondary | ICD-10-CM | POA: Diagnosis not present

## 2023-07-28 DIAGNOSIS — F05 Delirium due to known physiological condition: Secondary | ICD-10-CM | POA: Diagnosis not present

## 2023-07-28 DIAGNOSIS — Y95 Nosocomial condition: Secondary | ICD-10-CM | POA: Diagnosis not present

## 2023-07-28 DIAGNOSIS — Z880 Allergy status to penicillin: Secondary | ICD-10-CM

## 2023-07-28 DIAGNOSIS — J189 Pneumonia, unspecified organism: Secondary | ICD-10-CM | POA: Diagnosis not present

## 2023-07-28 DIAGNOSIS — Z79899 Other long term (current) drug therapy: Secondary | ICD-10-CM

## 2023-07-28 DIAGNOSIS — I959 Hypotension, unspecified: Secondary | ICD-10-CM | POA: Diagnosis not present

## 2023-07-28 DIAGNOSIS — J9811 Atelectasis: Secondary | ICD-10-CM | POA: Diagnosis not present

## 2023-07-28 DIAGNOSIS — Z515 Encounter for palliative care: Secondary | ICD-10-CM | POA: Diagnosis not present

## 2023-07-28 DIAGNOSIS — Y838 Other surgical procedures as the cause of abnormal reaction of the patient, or of later complication, without mention of misadventure at the time of the procedure: Secondary | ICD-10-CM | POA: Diagnosis not present

## 2023-07-28 DIAGNOSIS — K219 Gastro-esophageal reflux disease without esophagitis: Secondary | ICD-10-CM | POA: Diagnosis present

## 2023-07-28 DIAGNOSIS — Z6823 Body mass index (BMI) 23.0-23.9, adult: Secondary | ICD-10-CM

## 2023-07-28 DIAGNOSIS — J9600 Acute respiratory failure, unspecified whether with hypoxia or hypercapnia: Secondary | ICD-10-CM | POA: Diagnosis not present

## 2023-07-28 DIAGNOSIS — S14105S Unspecified injury at C5 level of cervical spinal cord, sequela: Secondary | ICD-10-CM | POA: Diagnosis not present

## 2023-07-28 DIAGNOSIS — Z79890 Hormone replacement therapy (postmenopausal): Secondary | ICD-10-CM

## 2023-07-28 DIAGNOSIS — J9621 Acute and chronic respiratory failure with hypoxia: Secondary | ICD-10-CM | POA: Diagnosis not present

## 2023-07-28 DIAGNOSIS — S14105A Unspecified injury at C5 level of cervical spinal cord, initial encounter: Secondary | ICD-10-CM | POA: Diagnosis present

## 2023-07-28 DIAGNOSIS — M6281 Muscle weakness (generalized): Secondary | ICD-10-CM | POA: Diagnosis present

## 2023-07-28 HISTORY — PX: POSTERIOR CERVICAL LAMINECTOMY: SHX2248

## 2023-07-28 LAB — TYPE AND SCREEN
ABO/RH(D): A POS
Antibody Screen: NEGATIVE

## 2023-07-28 LAB — GLUCOSE, CAPILLARY
Glucose-Capillary: 110 mg/dL — ABNORMAL HIGH (ref 70–99)
Glucose-Capillary: 157 mg/dL — ABNORMAL HIGH (ref 70–99)
Glucose-Capillary: 191 mg/dL — ABNORMAL HIGH (ref 70–99)
Glucose-Capillary: 227 mg/dL — ABNORMAL HIGH (ref 70–99)

## 2023-07-28 LAB — CBC
HCT: 28.7 % — ABNORMAL LOW (ref 36.0–46.0)
Hemoglobin: 9.6 g/dL — ABNORMAL LOW (ref 12.0–15.0)
MCH: 29 pg (ref 26.0–34.0)
MCHC: 33.4 g/dL (ref 30.0–36.0)
MCV: 86.7 fL (ref 80.0–100.0)
Platelets: 167 10*3/uL (ref 150–400)
RBC: 3.31 MIL/uL — ABNORMAL LOW (ref 3.87–5.11)
RDW: 13.9 % (ref 11.5–15.5)
WBC: 9.5 10*3/uL (ref 4.0–10.5)
nRBC: 0 % (ref 0.0–0.2)

## 2023-07-28 LAB — CREATININE, SERUM
Creatinine, Ser: 1.21 mg/dL — ABNORMAL HIGH (ref 0.44–1.00)
GFR, Estimated: 50 mL/min — ABNORMAL LOW (ref >60)

## 2023-07-28 SURGERY — CERVICAL LAMINOPLASTY
Anesthesia: General

## 2023-07-28 MED ORDER — KETAMINE HCL 50 MG/5ML IJ SOSY
PREFILLED_SYRINGE | INTRAMUSCULAR | Status: AC
  Filled 2023-07-28: qty 5

## 2023-07-28 MED ORDER — ACETAMINOPHEN 325 MG PO TABS
650.0000 mg | ORAL_TABLET | Freq: Four times a day (QID) | ORAL | Status: DC
  Administered 2023-07-29 – 2023-07-30 (×5): 650 mg via ORAL
  Filled 2023-07-28 (×7): qty 2

## 2023-07-28 MED ORDER — EPHEDRINE SULFATE-NACL 50-0.9 MG/10ML-% IV SOSY
PREFILLED_SYRINGE | INTRAVENOUS | Status: DC | PRN
  Administered 2023-07-28 (×2): 5 mg via INTRAVENOUS

## 2023-07-28 MED ORDER — FENTANYL CITRATE (PF) 100 MCG/2ML IJ SOLN
INTRAMUSCULAR | Status: DC | PRN
  Administered 2023-07-28 (×2): 50 ug via INTRAVENOUS

## 2023-07-28 MED ORDER — CYCLOBENZAPRINE HCL 10 MG PO TABS
10.0000 mg | ORAL_TABLET | Freq: Every day | ORAL | Status: DC
  Administered 2023-07-28 – 2023-07-30 (×3): 10 mg via ORAL
  Filled 2023-07-28 (×3): qty 1

## 2023-07-28 MED ORDER — GLIPIZIDE 10 MG PO TABS
10.0000 mg | ORAL_TABLET | Freq: Every day | ORAL | Status: DC
  Administered 2023-07-29 – 2023-07-30 (×2): 10 mg via ORAL
  Filled 2023-07-28 (×2): qty 1

## 2023-07-28 MED ORDER — MIDAZOLAM HCL 2 MG/2ML IJ SOLN
INTRAMUSCULAR | Status: DC | PRN
  Administered 2023-07-28: 2 mg via INTRAVENOUS

## 2023-07-28 MED ORDER — KETAMINE HCL 50 MG/5ML IJ SOSY
PREFILLED_SYRINGE | INTRAMUSCULAR | Status: DC | PRN
  Administered 2023-07-28: 20 mg via INTRAVENOUS

## 2023-07-28 MED ORDER — EPHEDRINE 5 MG/ML INJ
INTRAVENOUS | Status: AC
  Filled 2023-07-28: qty 5

## 2023-07-28 MED ORDER — CEFAZOLIN SODIUM-DEXTROSE 2-4 GM/100ML-% IV SOLN
2.0000 g | Freq: Once | INTRAVENOUS | Status: AC
  Administered 2023-07-28: 2 g via INTRAVENOUS

## 2023-07-28 MED ORDER — ACETAMINOPHEN 500 MG PO TABS
1000.0000 mg | ORAL_TABLET | Freq: Four times a day (QID) | ORAL | Status: DC
Start: 2023-07-28 — End: 2023-07-29
  Administered 2023-07-28 – 2023-07-29 (×3): 1000 mg via ORAL
  Filled 2023-07-28 (×3): qty 2

## 2023-07-28 MED ORDER — VITAMIN B-12 1000 MCG PO TABS
1000.0000 ug | ORAL_TABLET | Freq: Every day | ORAL | Status: DC
  Administered 2023-07-29 – 2023-08-01 (×3): 1000 ug via ORAL
  Filled 2023-07-28 (×4): qty 1

## 2023-07-28 MED ORDER — LEVOTHYROXINE SODIUM 88 MCG PO TABS
88.0000 ug | ORAL_TABLET | Freq: Every day | ORAL | Status: DC
  Administered 2023-07-29 – 2023-07-31 (×3): 88 ug via ORAL
  Filled 2023-07-28 (×8): qty 1

## 2023-07-28 MED ORDER — ACETAMINOPHEN 10 MG/ML IV SOLN
INTRAVENOUS | Status: AC
  Filled 2023-07-28: qty 100

## 2023-07-28 MED ORDER — SODIUM CHLORIDE 0.9% FLUSH
3.0000 mL | Freq: Two times a day (BID) | INTRAVENOUS | Status: DC
  Administered 2023-07-28 – 2023-07-29 (×3): 3 mL via INTRAVENOUS

## 2023-07-28 MED ORDER — SENNA 8.6 MG PO TABS
1.0000 | ORAL_TABLET | Freq: Every day | ORAL | Status: DC
  Administered 2023-07-28 – 2023-07-30 (×3): 8.6 mg via ORAL
  Filled 2023-07-28 (×3): qty 1

## 2023-07-28 MED ORDER — SURGIFLO WITH THROMBIN (HEMOSTATIC MATRIX KIT) OPTIME
TOPICAL | Status: DC | PRN
  Administered 2023-07-28 (×2): 1 via TOPICAL

## 2023-07-28 MED ORDER — BUPIVACAINE-EPINEPHRINE (PF) 0.5% -1:200000 IJ SOLN
INTRAMUSCULAR | Status: DC | PRN
  Administered 2023-07-28: 8 mL

## 2023-07-28 MED ORDER — SUCCINYLCHOLINE CHLORIDE 200 MG/10ML IV SOSY
PREFILLED_SYRINGE | INTRAVENOUS | Status: DC | PRN
  Administered 2023-07-28: 80 mg via INTRAVENOUS

## 2023-07-28 MED ORDER — VANCOMYCIN HCL IN DEXTROSE 1-5 GM/200ML-% IV SOLN
1000.0000 mg | Freq: Once | INTRAVENOUS | Status: AC
  Administered 2023-07-28: 1000 mg via INTRAVENOUS
  Filled 2023-07-28: qty 200

## 2023-07-28 MED ORDER — VANCOMYCIN HCL IN DEXTROSE 1-5 GM/200ML-% IV SOLN
1000.0000 mg | Freq: Once | INTRAVENOUS | Status: AC
  Administered 2023-07-28: 1000 mg via INTRAVENOUS

## 2023-07-28 MED ORDER — PROPOFOL 10 MG/ML IV BOLUS
INTRAVENOUS | Status: DC | PRN
  Administered 2023-07-28: 75 ug/kg/min via INTRAVENOUS
  Administered 2023-07-28: 100 mg via INTRAVENOUS

## 2023-07-28 MED ORDER — SODIUM CHLORIDE 0.9% FLUSH: 3.0000 mL | INTRAVENOUS | Status: DC | PRN

## 2023-07-28 MED ORDER — INSULIN ASPART 100 UNIT/ML IJ SOLN
0.0000 [IU] | Freq: Three times a day (TID) | INTRAMUSCULAR | Status: DC
  Administered 2023-07-28: 3 [IU] via SUBCUTANEOUS
  Administered 2023-07-29 – 2023-07-30 (×3): 2 [IU] via SUBCUTANEOUS
  Administered 2023-08-01: 3 [IU] via SUBCUTANEOUS
  Filled 2023-07-28 (×4): qty 1

## 2023-07-28 MED ORDER — PHENYLEPHRINE HCL (PRESSORS) 10 MG/ML IV SOLN
INTRAVENOUS | Status: DC | PRN
  Administered 2023-07-28: 80 ug via INTRAVENOUS
  Administered 2023-07-28 (×3): 160 ug via INTRAVENOUS
  Administered 2023-07-28: 80 ug via INTRAVENOUS

## 2023-07-28 MED ORDER — EMPAGLIFLOZIN 25 MG PO TABS
25.0000 mg | ORAL_TABLET | Freq: Every day | ORAL | Status: DC
  Administered 2023-07-29 – 2023-08-01 (×3): 25 mg via ORAL
  Filled 2023-07-28 (×4): qty 1

## 2023-07-28 MED ORDER — EPHEDRINE SULFATE (PRESSORS) 50 MG/ML IJ SOLN
INTRAMUSCULAR | Status: DC | PRN
  Administered 2023-07-28: 10 mg via INTRAVENOUS
  Administered 2023-07-28: 5 mg via INTRAVENOUS

## 2023-07-28 MED ORDER — ONDANSETRON HCL 4 MG/2ML IJ SOLN
4.0000 mg | Freq: Once | INTRAMUSCULAR | Status: AC | PRN
  Administered 2023-08-01: 4 mg via INTRAVENOUS
  Filled 2023-07-28: qty 2

## 2023-07-28 MED ORDER — OXYCODONE HCL 5 MG PO TABS
5.0000 mg | ORAL_TABLET | ORAL | Status: DC | PRN
  Administered 2023-07-29: 5 mg via ORAL
  Filled 2023-07-28: qty 1

## 2023-07-28 MED ORDER — LACTATED RINGERS IV SOLN: INTRAVENOUS | Status: DC | PRN

## 2023-07-28 MED ORDER — FLUDROCORTISONE ACETATE 0.1 MG PO TABS
0.2000 mg | ORAL_TABLET | Freq: Every day | ORAL | Status: DC
  Administered 2023-07-29 – 2023-08-01 (×3): 0.2 mg via ORAL
  Filled 2023-07-28 (×4): qty 2

## 2023-07-28 MED ORDER — CHLORHEXIDINE GLUCONATE 0.12 % MT SOLN
15.0000 mL | Freq: Once | OROMUCOSAL | Status: AC
Start: 2023-07-28 — End: 2023-07-28
  Administered 2023-07-28: 15 mL via OROMUCOSAL

## 2023-07-28 MED ORDER — LACTATED RINGERS IV SOLN: INTRAVENOUS | Status: AC

## 2023-07-28 MED ORDER — FENTANYL CITRATE (PF) 100 MCG/2ML IJ SOLN
INTRAMUSCULAR | Status: AC
  Filled 2023-07-28: qty 2

## 2023-07-28 MED ORDER — CHLORHEXIDINE GLUCONATE 4 % EX SOLN: 1.0000 | CUTANEOUS | 1 refills | Status: DC

## 2023-07-28 MED ORDER — SODIUM CHLORIDE (PF) 0.9 % IJ SOLN
INTRAMUSCULAR | Status: DC | PRN
  Administered 2023-07-28: 30 mL

## 2023-07-28 MED ORDER — PHENYLEPHRINE 80 MCG/ML (10ML) SYRINGE FOR IV PUSH (FOR BLOOD PRESSURE SUPPORT)
PREFILLED_SYRINGE | INTRAVENOUS | Status: DC | PRN
  Administered 2023-07-28: 160 ug via INTRAVENOUS

## 2023-07-28 MED ORDER — ORAL CARE MOUTH RINSE: 15.0000 mL | Freq: Once | OROMUCOSAL | Status: AC

## 2023-07-28 MED ORDER — CHLORHEXIDINE GLUCONATE 0.12 % MT SOLN
OROMUCOSAL | Status: AC
  Filled 2023-07-28: qty 15

## 2023-07-28 MED ORDER — ONDANSETRON HCL 4 MG/2ML IJ SOLN
INTRAMUSCULAR | Status: DC | PRN
  Administered 2023-07-28: 4 mg via INTRAVENOUS

## 2023-07-28 MED ORDER — ROCURONIUM BROMIDE 10 MG/ML (PF) SYRINGE
PREFILLED_SYRINGE | INTRAVENOUS | Status: AC
  Filled 2023-07-28: qty 10

## 2023-07-28 MED ORDER — SODIUM CHLORIDE 0.9 % IV SOLN
250.0000 mL | INTRAVENOUS | Status: DC
  Administered 2023-07-28: 250 mL via INTRAVENOUS

## 2023-07-28 MED ORDER — PROPOFOL 1000 MG/100ML IV EMUL
INTRAVENOUS | Status: AC
  Filled 2023-07-28: qty 100

## 2023-07-28 MED ORDER — BISACODYL 5 MG PO TBEC
5.0000 mg | DELAYED_RELEASE_TABLET | Freq: Every day | ORAL | Status: DC | PRN
  Filled 2023-07-28: qty 1

## 2023-07-28 MED ORDER — ENOXAPARIN SODIUM 30 MG/0.3ML IJ SOSY
30.0000 mg | PREFILLED_SYRINGE | INTRAMUSCULAR | Status: DC
  Administered 2023-07-29: 30 mg via SUBCUTANEOUS
  Filled 2023-07-28: qty 0.3

## 2023-07-28 MED ORDER — MENTHOL 3 MG MT LOZG: 1.0000 | LOZENGE | OROMUCOSAL | Status: DC | PRN

## 2023-07-28 MED ORDER — LIDOCAINE HCL (PF) 2 % IJ SOLN
INTRAMUSCULAR | Status: AC
  Filled 2023-07-28: qty 5

## 2023-07-28 MED ORDER — BUPIVACAINE-EPINEPHRINE (PF) 0.5% -1:200000 IJ SOLN
INTRAMUSCULAR | Status: AC
  Filled 2023-07-28: qty 20

## 2023-07-28 MED ORDER — ROSUVASTATIN CALCIUM 10 MG PO TABS
5.0000 mg | ORAL_TABLET | Freq: Every day | ORAL | Status: DC
  Administered 2023-07-28 – 2023-07-30 (×3): 5 mg via ORAL
  Filled 2023-07-28 (×3): qty 1

## 2023-07-28 MED ORDER — INSULIN ASPART 100 UNIT/ML IJ SOLN
0.0000 [IU] | Freq: Every day | INTRAMUSCULAR | Status: DC
  Administered 2023-07-28: 2 [IU] via SUBCUTANEOUS
  Filled 2023-07-28: qty 1

## 2023-07-28 MED ORDER — SODIUM CHLORIDE 0.9 % IV SOLN
INTRAVENOUS | Status: DC | PRN
  Administered 2023-07-28: .05 ug/kg/min via INTRAVENOUS

## 2023-07-28 MED ORDER — LIDOCAINE HCL (CARDIAC) PF 100 MG/5ML IV SOSY
PREFILLED_SYRINGE | INTRAVENOUS | Status: DC | PRN
  Administered 2023-07-28: 60 mg via INTRAVENOUS

## 2023-07-28 MED ORDER — FENTANYL CITRATE (PF) 100 MCG/2ML IJ SOLN
25.0000 ug | INTRAMUSCULAR | Status: DC | PRN
  Administered 2023-07-28 (×5): 25 ug via INTRAVENOUS

## 2023-07-28 MED ORDER — MIDODRINE HCL 5 MG PO TABS
5.0000 mg | ORAL_TABLET | Freq: Three times a day (TID) | ORAL | Status: DC
  Administered 2023-07-28 – 2023-08-01 (×9): 5 mg via ORAL
  Filled 2023-07-28 (×11): qty 1

## 2023-07-28 MED ORDER — PHENYLEPHRINE 80 MCG/ML (10ML) SYRINGE FOR IV PUSH (FOR BLOOD PRESSURE SUPPORT)
PREFILLED_SYRINGE | INTRAVENOUS | Status: AC
  Filled 2023-07-28: qty 10

## 2023-07-28 MED ORDER — DEXAMETHASONE SODIUM PHOSPHATE 10 MG/ML IJ SOLN
INTRAMUSCULAR | Status: DC | PRN
  Administered 2023-07-28: 4 mg via INTRAVENOUS

## 2023-07-28 MED ORDER — VANCOMYCIN HCL IN DEXTROSE 1-5 GM/200ML-% IV SOLN
INTRAVENOUS | Status: AC
  Filled 2023-07-28: qty 200

## 2023-07-28 MED ORDER — DEXAMETHASONE SODIUM PHOSPHATE 10 MG/ML IJ SOLN
INTRAMUSCULAR | Status: AC
  Filled 2023-07-28: qty 1

## 2023-07-28 MED ORDER — ACETAMINOPHEN 650 MG RE SUPP
650.0000 mg | Freq: Four times a day (QID) | RECTAL | Status: DC
  Filled 2023-07-28: qty 1

## 2023-07-28 MED ORDER — BACITRACIN ZINC 500 UNIT/GM EX OINT
TOPICAL_OINTMENT | CUTANEOUS | Status: DC | PRN
  Administered 2023-07-28: 1 via TOPICAL

## 2023-07-28 MED ORDER — PHENOL 1.4 % MT LIQD: 1.0000 | OROMUCOSAL | Status: DC | PRN

## 2023-07-28 MED ORDER — SENNOSIDES-DOCUSATE SODIUM 8.6-50 MG PO TABS: 1.0000 | ORAL_TABLET | Freq: Every evening | ORAL | Status: DC | PRN

## 2023-07-28 MED ORDER — ACETAMINOPHEN 10 MG/ML IV SOLN: 1000.0000 mg | Freq: Once | INTRAVENOUS | Status: DC | PRN

## 2023-07-28 MED ORDER — MIDAZOLAM HCL 2 MG/2ML IJ SOLN
INTRAMUSCULAR | Status: AC
Start: 2023-07-28 — End: ?
  Filled 2023-07-28: qty 2

## 2023-07-28 MED ORDER — PIOGLITAZONE HCL 30 MG PO TABS
30.0000 mg | ORAL_TABLET | Freq: Every day | ORAL | Status: DC
  Administered 2023-07-28 – 2023-07-30 (×3): 30 mg via ORAL
  Filled 2023-07-28 (×3): qty 1

## 2023-07-28 MED ORDER — GABAPENTIN 100 MG PO CAPS
200.0000 mg | ORAL_CAPSULE | Freq: Two times a day (BID) | ORAL | Status: DC
  Administered 2023-07-28 – 2023-07-30 (×5): 200 mg via ORAL
  Filled 2023-07-28 (×6): qty 2

## 2023-07-28 MED ORDER — BACITRACIN ZINC 500 UNIT/GM EX OINT
TOPICAL_OINTMENT | CUTANEOUS | Status: AC
  Filled 2023-07-28: qty 28.35

## 2023-07-28 MED ORDER — PHENYLEPHRINE HCL-NACL 20-0.9 MG/250ML-% IV SOLN
INTRAVENOUS | Status: DC | PRN
  Administered 2023-07-28: 30 ug/min via INTRAVENOUS

## 2023-07-28 MED ORDER — PHENYLEPHRINE HCL-NACL 20-0.9 MG/250ML-% IV SOLN
INTRAVENOUS | Status: AC
  Filled 2023-07-28: qty 250

## 2023-07-28 MED ORDER — LACTASE 3000 UNITS PO TABS
3000.0000 [IU] | ORAL_TABLET | Freq: Every day | ORAL | Status: DC
  Administered 2023-07-29 – 2023-07-30 (×2): 3000 [IU] via ORAL
  Filled 2023-07-28 (×8): qty 1

## 2023-07-28 MED ORDER — SODIUM CHLORIDE 0.9% FLUSH
3.0000 mL | Freq: Two times a day (BID) | INTRAVENOUS | Status: DC
  Administered 2023-07-28: 5 mL via INTRAVENOUS
  Administered 2023-07-28 – 2023-07-29 (×3): 3 mL via INTRAVENOUS
  Administered 2023-07-30: 10 mL via INTRAVENOUS
  Administered 2023-07-30: 3 mL via INTRAVENOUS
  Administered 2023-07-31 – 2023-08-03 (×3): 10 mL via INTRAVENOUS
  Administered 2023-08-03: 3 mL via INTRAVENOUS
  Administered 2023-08-04 – 2023-08-12 (×15): 10 mL via INTRAVENOUS
  Administered 2023-08-13: 3 mL via INTRAVENOUS
  Administered 2023-08-13 – 2023-08-14 (×2): 10 mL via INTRAVENOUS
  Administered 2023-08-14: 3 mL via INTRAVENOUS
  Administered 2023-08-15 – 2023-08-17 (×5): 10 mL via INTRAVENOUS
  Administered 2023-08-18: 8 mL via INTRAVENOUS
  Administered 2023-08-18 – 2023-08-19 (×2): 3 mL via INTRAVENOUS

## 2023-07-28 MED ORDER — ONDANSETRON HCL 4 MG/2ML IJ SOLN
INTRAMUSCULAR | Status: AC
  Filled 2023-07-28: qty 2

## 2023-07-28 MED ORDER — OXYCODONE HCL 5 MG PO TABS
10.0000 mg | ORAL_TABLET | ORAL | Status: DC | PRN
  Administered 2023-07-28 – 2023-07-30 (×3): 10 mg via ORAL
  Filled 2023-07-28 (×4): qty 2

## 2023-07-28 MED ORDER — OXYCODONE HCL 5 MG PO TABS: 5.0000 mg | ORAL_TABLET | Freq: Once | ORAL | Status: AC | PRN

## 2023-07-28 MED ORDER — PROPOFOL 10 MG/ML IV BOLUS
INTRAVENOUS | Status: AC
  Filled 2023-07-28: qty 20

## 2023-07-28 MED ORDER — ALUM & MAG HYDROXIDE-SIMETH 200-200-20 MG/5ML PO SUSP
30.0000 mL | Freq: Four times a day (QID) | ORAL | Status: DC | PRN
  Filled 2023-07-28: qty 30

## 2023-07-28 MED ORDER — OXYCODONE HCL 5 MG PO TABS
ORAL_TABLET | ORAL | Status: AC
  Filled 2023-07-28: qty 1

## 2023-07-28 MED ORDER — SODIUM CHLORIDE 0.9 % IV SOLN: INTRAVENOUS | Status: DC

## 2023-07-28 MED ORDER — CEFAZOLIN SODIUM-DEXTROSE 2-4 GM/100ML-% IV SOLN
INTRAVENOUS | Status: AC
  Filled 2023-07-28: qty 100

## 2023-07-28 MED ORDER — HYDROMORPHONE HCL 1 MG/ML IJ SOLN
1.0000 mg | INTRAMUSCULAR | Status: DC | PRN
  Administered 2023-07-30: 1 mg via INTRAVENOUS
  Filled 2023-07-28: qty 1

## 2023-07-28 MED ORDER — OXYCODONE HCL 5 MG/5ML PO SOLN
5.0000 mg | Freq: Once | ORAL | Status: AC | PRN
  Administered 2023-07-28: 5 mg via ORAL

## 2023-07-28 MED ORDER — REMIFENTANIL HCL 1 MG IV SOLR
INTRAVENOUS | Status: AC
  Filled 2023-07-28: qty 1000

## 2023-07-28 MED ORDER — DOCUSATE SODIUM 100 MG PO CAPS
100.0000 mg | ORAL_CAPSULE | Freq: Two times a day (BID) | ORAL | Status: DC
  Administered 2023-07-28 – 2023-08-01 (×6): 100 mg via ORAL
  Filled 2023-07-28 (×8): qty 1

## 2023-07-28 MED ORDER — BUPIVACAINE LIPOSOME 1.3 % IJ SUSP
INTRAMUSCULAR | Status: AC
  Filled 2023-07-28: qty 20

## 2023-07-28 MED ORDER — 0.9 % SODIUM CHLORIDE (POUR BTL) OPTIME
TOPICAL | Status: DC | PRN
  Administered 2023-07-28: 500 mL

## 2023-07-28 MED ORDER — SODIUM CHLORIDE FLUSH 0.9 % IV SOLN
INTRAVENOUS | Status: AC
  Filled 2023-07-28: qty 20

## 2023-07-28 MED ORDER — ACETAMINOPHEN 10 MG/ML IV SOLN
INTRAVENOUS | Status: DC | PRN
  Administered 2023-07-28: 1000 mg via INTRAVENOUS

## 2023-07-28 MED ORDER — BUPIVACAINE HCL (PF) 0.5 % IJ SOLN
INTRAMUSCULAR | Status: AC
  Filled 2023-07-28: qty 30

## 2023-07-28 MED ORDER — MUPIROCIN 2 % EX OINT: 1.0000 | TOPICAL_OINTMENT | Freq: Two times a day (BID) | CUTANEOUS | 0 refills | Status: AC

## 2023-07-28 SURGICAL SUPPLY — 56 items
BASIN KIT SINGLE STR (MISCELLANEOUS) ×1 IMPLANT
BIT DRILL CANOPY FLUTED 1.4 (BIT) IMPLANT
BRUSH SCRUB EZ 4% CHG (MISCELLANEOUS) ×1 IMPLANT
BUR NEURO DRILL SOFT 3.0X3.8M (BURR) ×1 IMPLANT
CHLORAPREP W/TINT 26 (MISCELLANEOUS) ×1 IMPLANT
DEPTH SLEEVE 6 (SLEEVE) ×1 IMPLANT
DERMABOND ADVANCED .7 DNX12 (GAUZE/BANDAGES/DRESSINGS) ×1 IMPLANT
DRAPE C-ARM XRAY 36X54 (DRAPES) ×2 IMPLANT
DRAPE LAPAROTOMY 100X77 ABD (DRAPES) ×1 IMPLANT
DRAPE MICROSCOPE SPINE 48X150 (DRAPES) ×1 IMPLANT
DRSG OPSITE POSTOP 3X4 (GAUZE/BANDAGES/DRESSINGS) ×1 IMPLANT
ELECT CAUTERY BLADE TIP 2.5 (TIP) IMPLANT
ELECT EZSTD 165MM 6.5IN (MISCELLANEOUS) ×1 IMPLANT
ELECTRODE CAUTERY BLDE TIP 2.5 (TIP) IMPLANT
ELECTRODE EZSTD 165MM 6.5IN (MISCELLANEOUS) ×1 IMPLANT
EVACUATOR 1/8 PVC DRAIN (DRAIN) IMPLANT
FEE INTRAOP CADWELL SUPPLY NCS (MISCELLANEOUS) IMPLANT
FEE INTRAOP MONITOR IMPULS NCS (MISCELLANEOUS) IMPLANT
GLOVE BIOGEL PI IND STRL 7.0 (GLOVE) ×1 IMPLANT
GLOVE BIOGEL PI IND STRL 8 (GLOVE) ×2 IMPLANT
GLOVE PROTEXIS LATEX SZ 7.5 (GLOVE) ×1 IMPLANT
GLOVE SURG LATEX 7.5 PF (GLOVE) ×1 IMPLANT
GLOVE SURG SYN 7.0 (GLOVE) ×3 IMPLANT
GLOVE SURG SYN 7.0 PF PI (GLOVE) ×1 IMPLANT
GLOVE SURG SYN 7.5 E (GLOVE) ×3 IMPLANT
GLOVE SURG SYN 7.5 PF PI (GLOVE) ×2 IMPLANT
GOWN SRG LRG LVL 4 IMPRV REINF (GOWNS) ×1 IMPLANT
GOWN SRG XL LVL 3 NONREINFORCE (GOWNS) ×1 IMPLANT
HOLDER FOLEY CATH W/STRAP (MISCELLANEOUS) IMPLANT
INTRAOP CADWELL SUPPLY FEE NCS (MISCELLANEOUS) ×1 IMPLANT
INTRAOP MONITOR FEE IMPULS NCS (MISCELLANEOUS) ×1 IMPLANT
JET LAVAGE IRRISEPT WOUND (IRRIGATION / IRRIGATOR) ×1 IMPLANT
KIT PREVENA INCISION MGT 13 (CANNISTER) ×1 IMPLANT
LAVAGE JET IRRISEPT WOUND (IRRIGATION / IRRIGATOR) ×1 IMPLANT
MANIFOLD NEPTUNE II (INSTRUMENTS) ×1 IMPLANT
MARKER SKIN DUAL TIP RULER LAB (MISCELLANEOUS) ×1 IMPLANT
NDL SAFETY ECLIPSE 18X1.5 (NEEDLE) ×1 IMPLANT
NS IRRIG 1000ML POUR BTL (IV SOLUTION) ×1 IMPLANT
PACK LAMINECTOMY ARMC (PACKS) ×1 IMPLANT
PIN MAYFIELD SKULL DISP (PIN) ×1 IMPLANT
PLATE CANOPY SHELF ADJ 7 (Plate) IMPLANT
SCREW CANOPY 2.6X6 (Screw) IMPLANT
SCREW SD RELIEVE 2.2X6 F/LAMIN (Screw) IMPLANT
SCREW SELF DRILL RELIEVE 6 (Screw) IMPLANT
SLEEVE DEPTH 6 (SLEEVE) IMPLANT
STAPLER SKIN PROX 35W (STAPLE) ×2 IMPLANT
SURGIFLO W/THROMBIN 8M KIT (HEMOSTASIS) ×1 IMPLANT
SUT ETHILON 3-0 FS-10 30 BLK (SUTURE) ×1 IMPLANT
SUT VIC AB 0 CT1 27XCR 8 STRN (SUTURE) ×3 IMPLANT
SUT VIC AB 2-0 CT1 18 (SUTURE) ×2 IMPLANT
SUTURE EHLN 3-0 FS-10 30 BLK (SUTURE) IMPLANT
SYR 30ML LL (SYRINGE) ×2 IMPLANT
TAPE CLOTH 3X10 WHT NS LF (GAUZE/BANDAGES/DRESSINGS) ×2 IMPLANT
TOWEL OR 17X26 4PK STRL BLUE (TOWEL DISPOSABLE) ×2 IMPLANT
TRAP FLUID SMOKE EVACUATOR (MISCELLANEOUS) ×1 IMPLANT
TRAY FOLEY SLVR 16FR LF STAT (SET/KITS/TRAYS/PACK) IMPLANT

## 2023-07-28 NOTE — Anesthesia Procedure Notes (Signed)
Procedure Name: Intubation Date/Time: 07/28/2023 7:22 AM  Performed by: Darrell Jewel I, CRNAPre-anesthesia Checklist: Patient identified, Emergency Drugs available, Suction available, Patient being monitored and Timeout performed Patient Re-evaluated:Patient Re-evaluated prior to induction Oxygen Delivery Method: Circle system utilized Preoxygenation: Pre-oxygenation with 100% oxygen Induction Type: IV induction Ventilation: Mask ventilation without difficulty Grade View: Grade I Tube type: Oral Tube size: 7.0 mm Number of attempts: 1 Airway Equipment and Method: Stylet and Oral airway Placement Confirmation: ETT inserted through vocal cords under direct vision, positive ETCO2 and breath sounds checked- equal and bilateral Secured at: 20 cm Tube secured with: Tape Dental Injury: Teeth and Oropharynx as per pre-operative assessment

## 2023-07-28 NOTE — Transfer of Care (Signed)
Immediate Anesthesia Transfer of Care Note  Patient: [name]  Procedure(s) Performed: C3-6 LAMINOPLASTY C2 DOME LAMINECTOMY  Patient Location: PACU  Anesthesia Type:General  Level of Consciousness: awake, alert , and oriented  Airway & Oxygen Therapy: Patient Spontanous Breathing and Patient connected to face mask oxygen  Post-op Assessment: Report given to RN and Post -op Vital signs reviewed and stable  Post vital signs: stable  Last Vitals:  Vitals Value Taken Time  BP 162/64 07/28/23 1115  Temp 36.1 C 07/28/23 1115  Pulse 84 07/28/23 1124  Resp 20 07/28/23 1124  SpO2 100 % 07/28/23 1124  Vitals shown include unfiled device data.  Last Pain:  Vitals:   07/28/23 0615  TempSrc: Temporal  PainSc: 0-No pain         Complications: No notable events documented.

## 2023-07-28 NOTE — Anesthesia Preprocedure Evaluation (Addendum)
Anesthesia Evaluation  Patient identified by MRN, date of birth, ID band Patient awake    Reviewed: Allergy & Precautions, NPO status , Patient's Chart, lab work & pertinent test results  History of Anesthesia Complications Negative for: history of anesthetic complications  Airway Mallampati: I   Neck ROM: Full    Dental  (+) Edentulous Upper, Edentulous Lower   Pulmonary neg pulmonary ROS   Pulmonary exam normal breath sounds clear to auscultation       Cardiovascular Normal cardiovascular exam Rhythm:Regular Rate:Normal  ECG 04/13/23: NSR; RBBB  Echo 07/16/22: NORMAL STRESS ECHOCARDIOGRAM. NORMAL RESTING STUDY WITH NO WALL MOTION ABNORMALITIES AT REST AND PEAK EXERCISE.  Maximum workload of 5.7 METs was achieved during exercise.     Neuro/Psych  Neuromuscular disease (neuropathy)    GI/Hepatic ,GERD  ,,  Endo/Other  diabetes, Type 2Hypothyroidism    Renal/GU Renal disease (stage III CKD)     Musculoskeletal  (+) Arthritis ,    Abdominal   Peds  Hematology  (+) Blood dyscrasia, anemia   Anesthesia Other Findings Cardiology note 07/09/23:  68 y.o. female with  Encounter Diagnoses  Name Primary?  Preop cardiovascular exam Yes  Abnormal EKG  Diabetes mellitus without complication (CMS/HHS-HCC)  Stage 3b chronic kidney disease (CMS/HHS-HCC)   Plan   Orders Placed This Encounter  Procedures  ECG stress test only  Echo stress test   Continue current cardiac medications. Encourage low-sodium diet, less than 2000 mg daily. Schedule imaging tests in office. Stress echo has been ordered. Prior EKGs reviewed, no evidence of gross ischemia. Patient denies anginal symptoms with activity and heavy exertion. She is a lifelong non-smoker. If her stress echo is within normal limits, she may proceed with surgery  Return in about 4 weeks (around 08/06/2023).    Reproductive/Obstetrics                              Anesthesia Physical Anesthesia Plan  ASA: 3  Anesthesia Plan: General   Post-op Pain Management:    Induction: Intravenous  PONV Risk Score and Plan: 3 and Ondansetron, Dexamethasone and Treatment may vary due to age or medical condition  Airway Management Planned: Oral ETT  Additional Equipment:   Intra-op Plan:   Post-operative Plan: Extubation in OR  Informed Consent: I have reviewed the patients History and Physical, chart, labs and discussed the procedure including the risks, benefits and alternatives for the proposed anesthesia with the patient or authorized representative who has indicated his/her understanding and acceptance.     Dental advisory given  Plan Discussed with: CRNA  Anesthesia Plan Comments: (Patient consented for risks of anesthesia including but not limited to:  - adverse reactions to medications - damage to eyes, teeth, lips or other oral mucosa - nerve damage due to positioning  - sore throat or hoarseness - damage to heart, brain, nerves, lungs, other parts of body or loss of life  Informed patient about role of CRNA in peri- and intra-operative care.  Patient voiced understanding.)        Anesthesia Quick Evaluation

## 2023-07-28 NOTE — H&P (View-Only) (Signed)
Referring Physician:  [name], MD [address] [place],  [place] [address]  Primary Physician:  Jerl Mina, MD  History of Present Illness: 07/28/2023 [name] is here today for follow-up after suffering a fall 2 months ago in which she suffered a cervical spinal cord injury.  She states she continues to have tingling down her right arm and into her hand with associated weakness.  Her arm and hand still feels shaky and sore and she is concerned because she is right-hand dominant.  Her legs also continue to feel weak and she has been taking gabapentin for neuropathy.  She indicates new urinary urgency since her fall.  No saddle anesthesia. After her discharge completed rehab and therapy.  The symptoms are causing a significant impact on the patient's life.   Review of Systems:  A 10 point review of systems is negative, except for the pertinent positives and negatives detailed in the HPI.  Past Medical History: Past Medical History:  Diagnosis Date   Acute incomplete quadriplegia (HCC)    Acute respiratory failure with hypoxia (HCC)    AKI (acute kidney injury) (HCC)    Arthritis    Blood dyscrasia    patient stated she is a "free bleeder"   Central cord syndrome (HCC)    Dental caries    Diabetes mellitus without complication (HCC)    Diverticulosis    Dysphagia    Esophageal stricture    Facial cellulitis    GERD (gastroesophageal reflux disease)    Hypothyroidism    Neuropathy    Odynophagia    Pelvic fracture (HCC)    Renal insufficiency    Spinal cord compression, post-traumatic (HCC)    Spinal cord injury, cervical region (HCC)    Stage 3a chronic kidney disease (CKD) (HCC)     Past Surgical History: Past Surgical History:  Procedure Laterality Date   ABDOMINAL HYSTERECTOMY     BILATERAL SALPINGECTOMY Bilateral 03/16/2017   Procedure: BILATERAL SALPINGECTOMY;  Surgeon: Schermerhorn, Ihor Austin, MD;  Location: [facility] ORS;  Service:  Gynecology;  Laterality: Bilateral;   BIOPSY  05/03/2023   Procedure: BIOPSY;  Surgeon: Benancio Deeds, MD;  Location: [facility] ENDOSCOPY;  Service: Gastroenterology;;   COLONOSCOPY WITH PROPOFOL N/A 08/18/2018   Procedure: COLONOSCOPY WITH PROPOFOL;  Surgeon: Scot Jun, MD;  Location: [facility] ENDOSCOPY;  Service: Endoscopy;  Laterality: N/A;   CYSTOCELE REPAIR N/A 03/16/2017   Procedure: ANTERIOR REPAIR (CYSTOCELE);  Surgeon: Schermerhorn, Ihor Austin, MD;  Location: [facility] ORS;  Service: Gynecology;  Laterality: N/A;   ESOPHAGOGASTRODUODENOSCOPY (EGD) WITH PROPOFOL N/A 08/18/2018   Procedure: ESOPHAGOGASTRODUODENOSCOPY (EGD) WITH PROPOFOL;  Surgeon: Scot Jun, MD;  Location: [facility] ENDOSCOPY;  Service: Endoscopy;  Laterality: N/A;   ESOPHAGOGASTRODUODENOSCOPY (EGD) WITH PROPOFOL Left 05/01/2023   Procedure: ESOPHAGOGASTRODUODENOSCOPY (EGD) WITH PROPOFOL;  Surgeon: Benancio Deeds, MD;  Location: [facility] ENDOSCOPY;  Service: Gastroenterology;  Laterality: Left;   ESOPHAGOGASTRODUODENOSCOPY (EGD) WITH PROPOFOL N/A 05/03/2023   Procedure: ESOPHAGOGASTRODUODENOSCOPY (EGD) WITH PROPOFOL;  Surgeon: Benancio Deeds, MD;  Location: [facility] ENDOSCOPY;  Service: Gastroenterology;  Laterality: N/A;   TEAR DUCT PROBING Right    TUBAL LIGATION     VAGINAL HYSTERECTOMY N/A 03/16/2017   Procedure: HYSTERECTOMY VAGINAL;  Surgeon: Schermerhorn, Ihor Austin, MD;  Location: [facility] ORS;  Service: Gynecology;  Laterality: N/A;   WISDOM TOOTH EXTRACTION      Allergies: Allergies as of 06/24/2023 - Review Complete 06/17/2023  Allergen Reaction Noted   Amoxicillin-pot clavulanate Itching  and Anaphylaxis 08/11/2021   Ampicillin Rash 10/28/2022    Medications: Outpatient Encounter Medications as of 06/17/2023  Medication Sig   acetaminophen (TYLENOL) 325 MG tablet Take 2 tablets (650 mg total) by mouth every 4 (four) hours as needed for mild pain (pain score 1-3) (temp > 101.5).   cholecalciferol (VITAMIN D3) 25  MCG (1000 UNIT) tablet Take 1,000 Units by mouth daily.   cyclobenzaprine (FLEXERIL) 10 MG tablet Take 1 tablet (10 mg total) by mouth at bedtime.   diclofenac Sodium (VOLTAREN) 1 % GEL Apply 2 g topically 4 (four) times daily.   docusate sodium (COLACE) 100 MG capsule Take 1 capsule (100 mg total) by mouth at bedtime.   fludrocortisone (FLORINEF) 0.1 MG tablet Take 2 tablets (0.2 mg total) by mouth daily. For orthostatic hypotension   gabapentin (NEURONTIN) 100 MG capsule Take 200 mg by mouth 2 (two) times daily.    glipiZIDE (GLUCOTROL) 5 MG tablet Take by mouth daily before breakfast.   JARDIANCE 25 MG TABS tablet Take 25 mg by mouth daily.   lactase (LACTAID) 3000 units tablet Take 3,000 Units by mouth daily before breakfast.   levothyroxine (SYNTHROID) 88 MCG tablet Take 88 mcg by mouth daily before breakfast.   metoCLOPramide (REGLAN) 10 MG tablet Take 1 tablet (10 mg total) by mouth every 8 (eight) hours as needed for nausea or vomiting.   midodrine (PROAMATINE) 10 MG tablet Take 1.5 tablets (15 mg total) by mouth 3 (three) times daily with meals. For orthostatic hypotension   Multiple Vitamin (MULTIVITAMIN WITH MINERALS) TABS tablet Take 1 tablet by mouth daily.   ondansetron (ZOFRAN-ODT) 4 MG disintegrating tablet Take 1 tablet (4 mg total) by mouth every 6 (six) hours as needed for nausea or vomiting.   pantoprazole (PROTONIX) 40 MG tablet Take 1 tablet (40 mg total) by mouth daily.   pioglitazone (ACTOS) 30 MG tablet Take 30 mg by mouth daily.   polyethylene glycol (MIRALAX / GLYCOLAX) 17 g packet Take 17 g by mouth daily as needed for moderate constipation.   psyllium (REGULOID) 0.52 g capsule Take 0.52 g by mouth daily.   rosuvastatin (CRESTOR) 5 MG tablet Take 5 mg by mouth daily.   senna (SENOKOT) 8.6 MG TABS tablet Take 1 tablet (8.6 mg total) by mouth at bedtime.   vitamin B-12 (CYANOCOBALAMIN) 100 MCG tablet Take 100 mcg by mouth daily.   [DISCONTINUED] levothyroxine  (SYNTHROID) 100 MCG tablet Take 100 mcg by mouth daily before breakfast.   No facility-administered encounter medications on file as of 06/17/2023.    Social History: Social History   Tobacco Use   Smoking status: Never   Smokeless tobacco: Never  Vaping Use   Vaping status: Never Used  Substance Use Topics   Alcohol use: No   Drug use: No    Family Medical History: Family History  Problem Relation Age of Onset   Breast cancer Maternal Aunt    Hypertension Mother    Hypertension Father    Diabetes Father     Physical Examination:   General: Patient is well developed, well nourished, calm, collected, and in no apparent distress. Attention to examination is appropriate.   Respiratory: Patient is breathing without any difficulty.  Heart Rate Regular  NEUROLOGICAL:     Awake, alert, oriented to person, place, and time.  Speech is clear and fluent. Fund of knowledge is appropriate.   Cranial Nerves: Pupils equal round and reactive to light.  Facial tone is symmetric.  Facial sensation  is symmetric.  ROM of spine: some tenderness to palpation of cervical spine Strength:  Notable weakness in right hand grip and intrinsic finger function at a 3 out of 5.  Patient does have difficulty with full range of motion right upper extremity which does make the exam more difficult but is 4+ to confrontation.   Reflexes are 3+ at biceps and brachioradialis. Hoffman's  positive. Decrease tingling sensation of right upper extremity Gait is abnormal.  Patient using walker.  Medical Decision Making  Imaging: MR Cervical spine, 04/12/20: IMPRESSION: 1. No acute traumatic finding by MRI. No fracture or evidence of ligamentous injury. 2. Severe spinal stenosis at C2-3 and C3-4 due to endplate osteophytes, bulging of the disc and posterior ligamentous prominence. AP diameter of the canal in the midline as narrow as 4.5- 4.7 mm. Effacement of the subarachnoid space and deformity  of the cord. Patient would be at risk of compressive myelopathy. Bilateral foraminal stenosis also present at these levels. 3. Moderate spinal stenosis at C4-5 and C5-6 due to endplate osteophytes, bulging of the disc and posterior ligamentous prominence. Narrowing of the subarachnoid space with slight indentation of the cord at C4-5. Bilateral foraminal stenosis at C4-5   I have personally reviewed the images and agree with the above interpretation.  Assessment and Plan: [name] is a pleasant 68 y.o. 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  Longstanding previous of cervical stenosis which put her at risk of cervical central cord injury. She states she continues to have tingling down her right arm and into her hand with associated weakness.  Her arm and hand still feels shaky and sore and she is concerned because she is right-hand dominant.  Her legs also continue to feel weak and she has been taking gabapentin for neuropathy.  She indicates new urinary urgency since her fall. Patient chose to delayed surgical treatment due to other medical issues and request from patient and family.  At this point patient is ready for decompression of her cervical spine and cervical laminoplasty was recommended.   Given these findings we can plan for a cervical laminoplasty from C3-C 6, with a C2 dome laminectomy.  Patient has recently lost some weight due to a peptide inhibitor, however she has recently stopped and is regaining her appetite.  We did discuss with her the importance of improving her diet and nutrition and how important that will be for her healing.  Will plan to move forward with the procedure given the tenuous nature of her spinal cord injury.  Ernestine Mcmurray, MD [facility] [place]

## 2023-07-28 NOTE — Op Note (Signed)
Indications: Miu Sandefer  is a 68yo female who presented with a cervical central cord injury after a trauma later last year.  They deferred emergent surgical decompression and wanted to let her have time to recover and work with physical therapy.  She continued to have hand weakness as well as lower extremity weakness and ambulatory issues.  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  Given the ongoing compression C3-C6 laminoplasty and C2 dome laminectomy were indicated  Findings: cervical stenosis  Preoperative Diagnosis:  Cervical spinal cord injury Cervical stenosis Upper extremity weakness Lower extremity weakness Cervical myelopathy  Postoperative Diagnosis: same   EBL: 300 ml IVF: see anesthesia record Drains: 1 placed Disposition: Extubated and Stable to PACU Complications: none  No foley catheter was placed.   Preoperative Note:   Risks of surgery discussed include: infection, bleeding, stroke, coma, death, paralysis, CSF leak, nerve/spinal cord injury, numbness, tingling, weakness, complex regional pain syndrome, recurrent stenosis and/or disc herniation, vascular injury, development of instability, neck/back pain, need for further surgery, persistent symptoms, development of deformity, and the risks of anesthesia. The patient understood these risks and agreed to proceed.  Operative Note:   OPERATIVE PROCEDURE:  1. Posterior Cervical Laminoplasty C3-6 2.  C2 inferior dome laminectomy   OPERATIVE PROCEDURE:  After induction of general anesthesia, the Mayfield was placed. The patient was then placed into the prone position on the standard table. A midline incision was then planned using fluoroscopy.  A timeout was performed, and antibiotics given.  Next, the posterior cervical region was prepped and draped in the usual sterile fashion. The incision was injected with local anesthetic, the opened sharply. A subperiosteal dissection was then carried out to expose the remaining  posterior elements from C2 and C6, with careful attention paid to maintaining the facet capsules. Care was taken to maintain the muscular attachements to the C2 spinous processes.  After satisfactory exposure had been obtained, the high speed drill was used to drill a partial thickness cut in the lamina from C3 to C6 on the left, and a full thickness cut from C3-6 on the right.  We then mobilized the lamina to expand the spinal canal. We used a nerve hook to elevate the ligamentum laterally, and resected with a kerrison rongeur.  We sized the space on the right, then placed laminoplasty plates at each level from C3-6.  Screws were placed into the lateral mass and lamina at each level to hold the laminoplasty plates in position.  We then turned our attention to the C2 dome laminectomy.  Starting at the inferior aspect of the C2 lamina we drilled from the underside of the bone and were able to continue to decompress superiorly until we are no longer seeing a ligamentous attachment on the underside of the C2 lamina.  We are able to reach the majority of this from the right side, however also moved over to the left to perform a bony removal at that side as well.  We are able to see the ligamentum in its entirety and removed it with a Kerrison rongeur.  We also used a Designer, industrial/product to help with elevation of the ligamentum.  We are able to see this clearly as the C3 lamina had been elevated with the laminoplasty plates.  We utilized fluoroscopy to verify that our C2 decompression entirely across the disc space. After decompression was complete, final radiographs were taken.  The wound was copiously irrigated with Irrisept solution and hemostasis was achieved.    A Hemovac  drain was then placed in the wound deep to the fascia.   The wound was closed in a multilayer fashion using interrupted 0 and 2-0 Vicryl sutures.  The final skin edges were reapproximated using a 3 oh STRATAFIX and glue.  After closure, the  patient was flipped supine and the Mayfield removed.  Patient was then handed back over to anesthesia.  All counts were correct at the conclusion of the procedure.  Neurological monitoring was used throughout, and there were no changes.  Joan Flores, PA-C acted as the Product manager in this case.  Their assistance was needed for exposure, retraction, protection of the neural elements, and application of the hardware.  I performed the critical aspects of the case myself.  [name], MD [facility] [place]  Implant Name Type Inv. Item Serial No. Manufacturer Lot No. LRB No. Used Action  SCREW CANOPY 2.6X6 - [id number] Screw SCREW CANOPY 2.6X6  [employer]  N/A 3 Implanted  SCREW LOCK LP 2.7X34 - [id number] Screw SCREW LOCK LP 2.7X34  [employer]  N/A 2 Implanted  SCREW SELF DRILL RELIEVE 6 - [id number] Screw SCREW SELF DRILL RELIEVE 6  [employer]  N/A 10 Implanted  Canopy Graft plate, 7 mm    [employer]  N/A 1 Implanted

## 2023-07-28 NOTE — Interval H&P Note (Signed)
History and Physical Interval Note:  07/28/2023 7:42 AM  [name]  has presented today for surgery, with the diagnosis of M48.02 Spinal stenosis in cervical region  S14.105D Spinal cord injury at C5-C7 level without injury of spinal bone, subsequent encounter  R29.898 Arm weakness R20.0 Numbness and tingling.  The various methods of treatment have been discussed with the patient and family. After consideration of risks, benefits and other options for treatment, the patient has consented to  Procedure(s): C3-6 LAMINOPLASTY (N/A) C2 DOME LAMINECTOMY (N/A) as a surgical intervention.  The patient's history has been reviewed, patient examined, no change in status, stable for surgery.  I have reviewed the patient's chart and labs.  Questions were answered to the patient's satisfaction.     Heart and lungs clear  [name]

## 2023-07-28 NOTE — Progress Notes (Signed)
Referring Physician:  [name], MD [address] [place],  [place] [address]  Primary Physician:  Jerl Mina, MD  History of Present Illness: 07/28/2023 [name] is here today for follow-up after suffering a fall 2 months ago in which she suffered a cervical spinal cord injury.  She states she continues to have tingling down her right arm and into her hand with associated weakness.  Her arm and hand still feels shaky and sore and she is concerned because she is right-hand dominant.  Her legs also continue to feel weak and she has been taking gabapentin for neuropathy.  She indicates new urinary urgency since her fall.  No saddle anesthesia. After her discharge completed rehab and therapy.  The symptoms are causing a significant impact on the patient's life.   Review of Systems:  A 10 point review of systems is negative, except for the pertinent positives and negatives detailed in the HPI.  Past Medical History: Past Medical History:  Diagnosis Date   Acute incomplete quadriplegia (HCC)    Acute respiratory failure with hypoxia (HCC)    AKI (acute kidney injury) (HCC)    Arthritis    Blood dyscrasia    patient stated she is a "free bleeder"   Central cord syndrome (HCC)    Dental caries    Diabetes mellitus without complication (HCC)    Diverticulosis    Dysphagia    Esophageal stricture    Facial cellulitis    GERD (gastroesophageal reflux disease)    Hypothyroidism    Neuropathy    Odynophagia    Pelvic fracture (HCC)    Renal insufficiency    Spinal cord compression, post-traumatic (HCC)    Spinal cord injury, cervical region (HCC)    Stage 3a chronic kidney disease (CKD) (HCC)     Past Surgical History: Past Surgical History:  Procedure Laterality Date   ABDOMINAL HYSTERECTOMY     BILATERAL SALPINGECTOMY Bilateral 03/16/2017   Procedure: BILATERAL SALPINGECTOMY;  Surgeon: Schermerhorn, Ihor Austin, MD;  Location: [facility] ORS;  Service:  Gynecology;  Laterality: Bilateral;   BIOPSY  05/03/2023   Procedure: BIOPSY;  Surgeon: Benancio Deeds, MD;  Location: [facility] ENDOSCOPY;  Service: Gastroenterology;;   COLONOSCOPY WITH PROPOFOL N/A 08/18/2018   Procedure: COLONOSCOPY WITH PROPOFOL;  Surgeon: Scot Jun, MD;  Location: [facility] ENDOSCOPY;  Service: Endoscopy;  Laterality: N/A;   CYSTOCELE REPAIR N/A 03/16/2017   Procedure: ANTERIOR REPAIR (CYSTOCELE);  Surgeon: Schermerhorn, Ihor Austin, MD;  Location: [facility] ORS;  Service: Gynecology;  Laterality: N/A;   ESOPHAGOGASTRODUODENOSCOPY (EGD) WITH PROPOFOL N/A 08/18/2018   Procedure: ESOPHAGOGASTRODUODENOSCOPY (EGD) WITH PROPOFOL;  Surgeon: Scot Jun, MD;  Location: [facility] ENDOSCOPY;  Service: Endoscopy;  Laterality: N/A;   ESOPHAGOGASTRODUODENOSCOPY (EGD) WITH PROPOFOL Left 05/01/2023   Procedure: ESOPHAGOGASTRODUODENOSCOPY (EGD) WITH PROPOFOL;  Surgeon: Benancio Deeds, MD;  Location: [facility] ENDOSCOPY;  Service: Gastroenterology;  Laterality: Left;   ESOPHAGOGASTRODUODENOSCOPY (EGD) WITH PROPOFOL N/A 05/03/2023   Procedure: ESOPHAGOGASTRODUODENOSCOPY (EGD) WITH PROPOFOL;  Surgeon: Benancio Deeds, MD;  Location: [facility] ENDOSCOPY;  Service: Gastroenterology;  Laterality: N/A;   TEAR DUCT PROBING Right    TUBAL LIGATION     VAGINAL HYSTERECTOMY N/A 03/16/2017   Procedure: HYSTERECTOMY VAGINAL;  Surgeon: Schermerhorn, Ihor Austin, MD;  Location: [facility] ORS;  Service: Gynecology;  Laterality: N/A;   WISDOM TOOTH EXTRACTION      Allergies: Allergies as of 06/24/2023 - Review Complete 06/17/2023  Allergen Reaction Noted   Amoxicillin-pot clavulanate Itching  and Anaphylaxis 08/11/2021   Ampicillin Rash 10/28/2022    Medications: Outpatient Encounter Medications as of 06/17/2023  Medication Sig   acetaminophen (TYLENOL) 325 MG tablet Take 2 tablets (650 mg total) by mouth every 4 (four) hours as needed for mild pain (pain score 1-3) (temp > 101.5).   cholecalciferol (VITAMIN D3) 25  MCG (1000 UNIT) tablet Take 1,000 Units by mouth daily.   cyclobenzaprine (FLEXERIL) 10 MG tablet Take 1 tablet (10 mg total) by mouth at bedtime.   diclofenac Sodium (VOLTAREN) 1 % GEL Apply 2 g topically 4 (four) times daily.   docusate sodium (COLACE) 100 MG capsule Take 1 capsule (100 mg total) by mouth at bedtime.   fludrocortisone (FLORINEF) 0.1 MG tablet Take 2 tablets (0.2 mg total) by mouth daily. For orthostatic hypotension   gabapentin (NEURONTIN) 100 MG capsule Take 200 mg by mouth 2 (two) times daily.    glipiZIDE (GLUCOTROL) 5 MG tablet Take by mouth daily before breakfast.   JARDIANCE 25 MG TABS tablet Take 25 mg by mouth daily.   lactase (LACTAID) 3000 units tablet Take 3,000 Units by mouth daily before breakfast.   levothyroxine (SYNTHROID) 88 MCG tablet Take 88 mcg by mouth daily before breakfast.   metoCLOPramide (REGLAN) 10 MG tablet Take 1 tablet (10 mg total) by mouth every 8 (eight) hours as needed for nausea or vomiting.   midodrine (PROAMATINE) 10 MG tablet Take 1.5 tablets (15 mg total) by mouth 3 (three) times daily with meals. For orthostatic hypotension   Multiple Vitamin (MULTIVITAMIN WITH MINERALS) TABS tablet Take 1 tablet by mouth daily.   ondansetron (ZOFRAN-ODT) 4 MG disintegrating tablet Take 1 tablet (4 mg total) by mouth every 6 (six) hours as needed for nausea or vomiting.   pantoprazole (PROTONIX) 40 MG tablet Take 1 tablet (40 mg total) by mouth daily.   pioglitazone (ACTOS) 30 MG tablet Take 30 mg by mouth daily.   polyethylene glycol (MIRALAX / GLYCOLAX) 17 g packet Take 17 g by mouth daily as needed for moderate constipation.   psyllium (REGULOID) 0.52 g capsule Take 0.52 g by mouth daily.   rosuvastatin (CRESTOR) 5 MG tablet Take 5 mg by mouth daily.   senna (SENOKOT) 8.6 MG TABS tablet Take 1 tablet (8.6 mg total) by mouth at bedtime.   vitamin B-12 (CYANOCOBALAMIN) 100 MCG tablet Take 100 mcg by mouth daily.   [DISCONTINUED] levothyroxine  (SYNTHROID) 100 MCG tablet Take 100 mcg by mouth daily before breakfast.   No facility-administered encounter medications on file as of 06/17/2023.    Social History: Social History   Tobacco Use   Smoking status: Never   Smokeless tobacco: Never  Vaping Use   Vaping status: Never Used  Substance Use Topics   Alcohol use: No   Drug use: No    Family Medical History: Family History  Problem Relation Age of Onset   Breast cancer Maternal Aunt    Hypertension Mother    Hypertension Father    Diabetes Father     Physical Examination:   General: Patient is well developed, well nourished, calm, collected, and in no apparent distress. Attention to examination is appropriate.   Respiratory: Patient is breathing without any difficulty.  Heart Rate Regular  NEUROLOGICAL:     Awake, alert, oriented to person, place, and time.  Speech is clear and fluent. Fund of knowledge is appropriate.   Cranial Nerves: Pupils equal round and reactive to light.  Facial tone is symmetric.  Facial sensation  is symmetric.  ROM of spine: some tenderness to palpation of cervical spine Strength:  Notable weakness in right hand grip and intrinsic finger function at a 3 out of 5.  Patient does have difficulty with full range of motion right upper extremity which does make the exam more difficult but is 4+ to confrontation.   Reflexes are 3+ at biceps and brachioradialis. Hoffman's  positive. Decrease tingling sensation of right upper extremity Gait is abnormal.  Patient using walker.  Medical Decision Making  Imaging: MR Cervical spine, 04/12/20: IMPRESSION: 1. No acute traumatic finding by MRI. No fracture or evidence of ligamentous injury. 2. Severe spinal stenosis at C2-3 and C3-4 due to endplate osteophytes, bulging of the disc and posterior ligamentous prominence. AP diameter of the canal in the midline as narrow as 4.5- 4.7 mm. Effacement of the subarachnoid space and deformity  of the cord. Patient would be at risk of compressive myelopathy. Bilateral foraminal stenosis also present at these levels. 3. Moderate spinal stenosis at C4-5 and C5-6 due to endplate osteophytes, bulging of the disc and posterior ligamentous prominence. Narrowing of the subarachnoid space with slight indentation of the cord at C4-5. Bilateral foraminal stenosis at C4-5   I have personally reviewed the images and agree with the above interpretation.  Assessment and Plan: Ms. Michals is a pleasant 68 y.o. 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  Longstanding previous of cervical stenosis which put her at risk of cervical central cord injury. She states she continues to have tingling down her right arm and into her hand with associated weakness.  Her arm and hand still feels shaky and sore and she is concerned because she is right-hand dominant.  Her legs also continue to feel weak and she has been taking gabapentin for neuropathy.  She indicates new urinary urgency since her fall. Patient chose to delayed surgical treatment due to other medical issues and request from patient and family.  At this point patient is ready for decompression of her cervical spine and cervical laminoplasty was recommended.   Given these findings we can plan for a cervical laminoplasty from C3-C 6, with a C2 dome laminectomy.  Patient has recently lost some weight due to a peptide inhibitor, however she has recently stopped and is regaining her appetite.  We did discuss with her the importance of improving her diet and nutrition and how important that will be for her healing.  Will plan to move forward with the procedure given the tenuous nature of her spinal cord injury.  Ernestine Mcmurray, MD [facility] [place]

## 2023-07-28 NOTE — Discharge Instructions (Signed)
Your surgeon has performed an operation on your cervical spine (neck) to relieve pressure on the spinal cord and/or nerves. This involved making an incision in the front of your neck and removing one or more of the discs that support your spine. Next, a small piece of bone, a titanium plate, and screws were used to fuse two or more of the vertebrae (bones) together.  The following are instructions to help in your recovery once you have been discharged from the hospital. Even if you feel well, it is important that you follow these activity guidelines. If you do not let your neck heal properly from the surgery, you can increase the chance of return of your symptoms and other complications.  * Do not take anti-inflammatory medications for 3 months after surgery (naproxen [Aleve], ibuprofen [Advil, Motrin], celecoxib [Celebrex], etc.). These medications can prevent your bones from healing properly.  Activity    No bending, lifting, or twisting ("BLT"). Avoid lifting objects heavier than 10 pounds (gallon milk jug).  Where possible, avoid household activities that involve lifting, bending, reaching, pushing, or pulling such as laundry, vacuuming, grocery shopping, and childcare. Try to arrange for help from friends and family for these activities while your back heals.  Increase physical activity slowly as tolerated.  Taking short walks is encouraged, but avoid strenuous exercise. Do not jog, run, bicycle, lift weights, or participate in any other exercises unless specifically allowed by your doctor.  Talk to your doctor before resuming sexual activity.  You should not drive until cleared by your doctor.  Until released by your doctor, you should not return to work or school.  You should rest at home and let your body heal.   You may shower three days after your surgery.  After showering, lightly dab your incision dry. Do not take a tub bath or go swimming until approved by your doctor at your  follow-up appointment.  If your doctor ordered a cervical collar (neck brace) for you, you should wear it whenever you are out of bed. You may remove it when lying down or sleeping, but you should wear it at all other times. Not all neck surgeries require a cervical collar.  If you smoke, we strongly recommend that you quit.  Smoking has been proven to interfere with normal bone healing and will dramatically reduce the success rate of your surgery. Please contact [facility] (800-QUIT-NOW) and use the resources at www.[facility].com for assistance in stopping smoking.  Surgical Incision   If you have a dressing on your incision, you may remove it two days after your surgery. Keep your incision area clean and dry.  If you have staples or stitches on your incision, you should have a follow up scheduled for removal. If you do not have staples or stitches, you will have steri-strips (small pieces of surgical tape) or Dermabond glue. The steri-strips/glue should begin to peel away within about a week (it is fine if the steri-strips fall off before then). If the strips are still in place one week after your surgery, you may gently remove them.  Diet           You may return to your usual diet. However, you may experience discomfort when swallowing in the first month after your surgery. This is normal. You may find that softer foods are more comfortable for you to swallow. Be sure to stay hydrated.  When to Contact us  You may experience pain in your neck and/or pain between your shoulder  blades. This is normal and should improve in the next few weeks with the help of pain medication, muscle relaxers, and rest. Some patients report that a warm compress on the back of the neck or between the shoulder blades helps.  However, should you experience any of the following, contact us immediately: New numbness or weakness Pain that is progressively getting worse, and is not relieved by your pain medication,  muscle relaxers, rest, and warm compresses Bleeding, redness, swelling, pain, or drainage from surgical incision Chills or flu-like symptoms Fever greater than 101.0 F (38.3 C) Inability to eat, drink fluids, or take medications Problems with bowel or bladder functions Difficulty breathing or shortness of breath Warmth, tenderness, or swelling in your calf Contact Information How to contact us:  If you have any questions/concerns before or after surgery, you can reach [place] at [phone], or you can send a mychart message. We can be reached by phone or mychart 8am-4pm, Monday-Friday.  *Please note: Calls after 4pm are forwarded to a third party answering service. Mychart messages are not routinely monitored during evenings, weekends, and holidays. Please call our office to contact the answering service for urgent concerns during non-business hours.

## 2023-07-28 NOTE — Plan of Care (Signed)

## 2023-07-28 NOTE — Anesthesia Postprocedure Evaluation (Signed)
Anesthesia Post Note  Patient: [name]  Procedure(s) Performed: C3-6 LAMINOPLASTY C2 DOME LAMINECTOMY  Patient location during evaluation: PACU Anesthesia Type: General Level of consciousness: awake and alert, oriented and patient cooperative Pain management: pain level controlled Vital Signs Assessment: post-procedure vital signs reviewed and stable Respiratory status: spontaneous breathing, nonlabored ventilation and respiratory function stable Cardiovascular status: blood pressure returned to baseline and stable Postop Assessment: adequate PO intake Anesthetic complications: no   No notable events documented.   Last Vitals:  Vitals:   07/28/23 1230 07/28/23 1245  BP: (!) 146/64 (!) 144/68  Pulse: 83 80  Resp: 15 13  Temp:    SpO2: 98% 99%    Last Pain:  Vitals:   07/28/23 1245  TempSrc:   PainSc: Asleep                 Reed Breech

## 2023-07-29 ENCOUNTER — Encounter: Payer: Medicare HMO | Admitting: Physician Assistant

## 2023-07-29 LAB — GLUCOSE, CAPILLARY
Glucose-Capillary: 132 mg/dL — ABNORMAL HIGH (ref 70–99)
Glucose-Capillary: 122 mg/dL — ABNORMAL HIGH (ref 70–99)
Glucose-Capillary: 81 mg/dL (ref 70–99)
Glucose-Capillary: 115 mg/dL — ABNORMAL HIGH (ref 70–99)

## 2023-07-29 MED ORDER — ENOXAPARIN SODIUM 40 MG/0.4ML IJ SOSY
40.0000 mg | PREFILLED_SYRINGE | INTRAMUSCULAR | Status: DC
  Administered 2023-07-30 – 2023-08-01 (×3): 40 mg via SUBCUTANEOUS
  Filled 2023-07-29 (×3): qty 0.4

## 2023-07-29 NOTE — Progress Notes (Signed)
Pt drowsy this shift, neuro checks have been WDL otherwise, pt denies numbness/tingling in all extremities, strength and movement moderate in BUE, weaker in BLE,  possibly to do drowsiness, will assess when more alert pt foley removed this am per MD order, no bleeding or burning noted, pt tolerated well, asked for bedpan right after and able to void 50ml, call bell in reach

## 2023-07-29 NOTE — Progress Notes (Signed)
  Inpatient Rehab Admissions Coordinator :  Per OT therapy recommendations, patient was screened for CIR candidacy by Ottie Glazier RN MSN.  PT recommends < 3 hrs. Recent CIR admit 10/16 until 11/5 with OP follow up with Dr Berline Chough.  At this time patient appears to be a potential candidate for CIR. I will place a rehab consult per protocol for full assessment. Please call me with any questions.  Ottie Glazier RN MSN Admissions Coordinator [phone]

## 2023-07-29 NOTE — Evaluation (Signed)
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  They deferred emergent surgical decompression and wanted to let her have time to recover and work with physical therapy.  She continued to have hand weakness as well as lower extremity weakness and ambulatory issues.  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  S/p Posterior Cervical Laminoplasty C3-6 on 07/28/23   Clinical Impression   [name] was seen for OT evaluation this date. Prior to hospital admission, pt was recently d/c home from inpatient rehab ambulatory with w/c use as needed. Pt lives with spouse and son (disabilty, does not drive); pt reports she was driving recently. Pt presents to acute OT demonstrating impaired ADL performance and functional mobility 2/2 decreased activity tolerance, pain, and functional strength/ROM deficits. Pt currently requires  SETUP self feeding bed level. MIN A + HHA for BSC t/f and pericare in standing, limited by 10/10 pain with movement. Pt would benefit from skilled OT to address noted impairments and functional limitations (see below for any additional details). Upon hospital discharge, recommend OT follow up >3 hours/day.    If plan is discharge home, recommend the following: A little help with walking and/or transfers;A little help with bathing/dressing/bathroom    Functional Status Assessment  Patient has had a recent decline in their functional status and demonstrates the ability to make significant improvements in function in a reasonable and predictable amount of time.  Equipment Recommendations  Other (comment) (defer)    Recommendations for Other Services       Precautions / Restrictions Precautions Precautions: Cervical;Fall Restrictions Weight Bearing Restrictions Per  Provider Order: No      Mobility Bed Mobility Overal bed mobility: Needs Assistance Bed Mobility: Rolling, Sidelying to Sit, Sit to Sidelying Rolling: Min assist Sidelying to sit: Min assist     Sit to sidelying: Min assist      Transfers Overall transfer level: Needs assistance Equipment used: 1 person hand held assist Transfers: Sit to/from Stand, Bed to chair/wheelchair/BSC Sit to Stand: Contact guard assist     Step pivot transfers: Min assist            Balance Overall balance assessment: Needs assistance Sitting-balance support: No upper extremity supported, Feet supported Sitting balance-Leahy Scale: Fair     Standing balance support: Single extremity supported, During functional activity Standing balance-Leahy Scale: Poor                             ADL either performed or assessed with clinical judgement   ADL Overall ADL's : Needs assistance/impaired                                       General ADL Comments: SETUP self feeding bed level. MIN A + HHA for BSC t/f and pericare in standing, limited by 10/10 pain with movement      Pertinent Vitals/Pain Pain Assessment Pain Assessment: 0-10 Pain Score: 10-Worst pain ever Pain Location: shoulders Pain Descriptors / Indicators: Discomfort, Grimacing, Radiating Pain Intervention(s): Limited activity within patient's tolerance, Premedicated before session, Repositioned     Extremity/Trunk Assessment Upper Extremity Assessment Upper Extremity Assessment: RUE deficits/detail;LUE deficits/detail RUE Deficits / Details: shoulder flexion ~  80*, grip 4+/5 LUE Deficits / Details: shoulder flexion ~80*, grip 4+/5           Communication Communication Communication: Difficulty communicating thoughts/reduced clarity of speech   Cognition Arousal: Alert, Lethargic, Suspect due to medications Behavior During Therapy: WFL for tasks assessed/performed Overall Cognitive Status:  Impaired/Different from baseline Area of Impairment: Attention                   Current Attention Level: Selective           General Comments: follows all commands and oriented x4 however tangential speech and difficulty answering PLOF clearly                Home Living Family/patient expects to be discharged to:: Private residence Living Arrangements: Spouse/significant other Available Help at Discharge: Family Type of Home: Apartment Home Access: Level entry     Home Layout: One level     Bathroom Shower/Tub: Dietitian: Wheelchair - manual          Prior Functioning/Environment Prior Level of Function : Working/employed;Driving             Mobility Comments: reports ambulatory with intermittent w/c use for distance, driving as needed          OT Problem List: Decreased strength;Decreased range of motion;Decreased activity tolerance;Impaired balance (sitting and/or standing);Decreased safety awareness;Pain      OT Treatment/Interventions: Self-care/ADL training;Therapeutic exercise;Energy conservation;DME and/or AE instruction;Therapeutic activities;Neuromuscular education;Balance training;Patient/family education    OT Goals(Current goals can be found in the care plan section) Acute Rehab OT Goals Patient Stated Goal: to go home OT Goal Formulation: With patient Time For Goal Achievement: 08/12/23 Potential to Achieve Goals: Good ADL Goals Pt Will Perform Grooming: with modified independence;standing Pt Will Perform Lower Body Dressing: with modified independence;sit to/from stand Pt Will Transfer to Toilet: with modified independence;ambulating;regular height toilet  OT Frequency: Min 1X/week    Co-evaluation              AM-PAC OT "6 Clicks" Daily Activity     Outcome Measure Help from another person eating meals?: A Little Help from another person taking care of personal grooming?: A Little Help  from another person toileting, which includes using toliet, bedpan, or urinal?: A Little Help from another person bathing (including washing, rinsing, drying)?: A Lot Help from another person to put on and taking off regular upper body clothing?: A Little Help from another person to put on and taking off regular lower body clothing?: A Lot 6 Click Score: 16   End of Session    Activity Tolerance: Patient limited by pain Patient left: in bed;with call bell/phone within reach;with bed alarm set  OT Visit Diagnosis: Unsteadiness on feet (R26.81);Muscle weakness (generalized) (M62.81)                Time: 1610-9604 OT Time Calculation (min): 25 min Charges:  OT General Charges $OT Visit: 1 Visit OT Evaluation $OT Eval Moderate Complexity: 1 Mod OT Treatments $Self Care/Home Management : 8-22 mins  Kathie Dike, M.S. OTR/L  07/29/23, 10:12 AM  ascom [phone]

## 2023-07-29 NOTE — Plan of Care (Signed)

## 2023-07-29 NOTE — Evaluation (Signed)
Physical Therapy Evaluation Patient Details Name: [name] MRN: [id number] DOB: Mar 03, 1956 Today's Date: 07/29/2023  History of Present Illness  Vivyana [name] is a 67yoF who presents 1/21 for C3-6 cervical laminoplasty (posterior approach) and C2 inferior dome laminectomy s/p cervical spine central cord injury. Pt recently (Nov 2024) at [facility] in [place], therapy notes describe significant recurrent orthostatic hypotension without consistent symptomology, but notable functional limitations. Pt sustained a fall in October 2024 where she injured cervical spine, also sustained Rt pubic rami fractures, was also orthostatic while at [facility]. PMH: blood dyscrasia, DM2, diverticulosis, hypoTSH, neuropathy, renal insufficiency.  Clinical Impression  Pt in bed on entry, husband and son at bedside. Pt remains very lethargic, eyes remain closed most of session regardless fo cues, pt is hypophonic and short in response. Pt reports 10/10 throughout session and it really appears this high during mobility to EOB. Pt is motivated to get to [facility] for voiding, largely successful given safety issues, but no gross LOB while using RW. Transfer is labored and mobility generally slow. Pt agreeable to sit up to chair at end of session. Appears comfortable and more alert. Family in room at end of session (stepped out during [facility] time).       If plan is discharge home, recommend the following: A lot of help with walking and/or transfers   Can travel by private vehicle   No    Equipment Recommendations None recommended by PT  Recommendations for Other Services       Functional Status Assessment Patient has had a recent decline in their functional status and demonstrates the ability to make significant improvements in function in a reasonable and predictable amount of time.     Precautions / Restrictions Precautions Precautions: Cervical;Fall Restrictions Weight Bearing Restrictions Per Provider Order: No       Mobility  Bed Mobility Overal bed mobility: Needs Assistance Bed Mobility: Supine to Sit   Sidelying to sit: Min assist, HOB elevated, Used rails       General bed mobility comments: cue to open eyes so she can locate bed rail rather than tactile localization which is not appearing successful. freezes due ot pain abotu 75% into this journey    Transfers Overall transfer level: Needs assistance Equipment used: Rolling walker (2 wheels) Transfers: Sit to/from Stand, Bed to chair/wheelchair/BSC Sit to Stand: Contact guard assist   Step pivot transfers: Contact guard assist       General transfer comment: slow, but appears steady; cues for haste given chronic BP issues; able to stand for pericare after voiding    Ambulation/Gait Ambulation/Gait assistance:  (deferred due to lethargy, somnolence, bradykinesia)                Stairs            Wheelchair Mobility     Tilt Bed    Modified Rankin (Stroke Patients Only)       Balance                                             Pertinent Vitals/Pain Pain Assessment Pain Assessment: 0-10 Pain Score: 10-Worst pain ever Pain Location: shoulders bilat Pain Descriptors / Indicators: Discomfort, Grimacing, Radiating, Operative site guarding Pain Intervention(s): Limited activity within patient's tolerance, Monitored during session, Premedicated before session    Home Living Family/patient expects to be discharged to:: Private residence Living Arrangements:  Spouse/significant other (husband and disabled adult son) Available Help at Discharge: Family Type of Home: Apartment Home Access: Level entry       Home Layout: One level Home Equipment: Wheelchair - Forensic psychologist (2 wheels);Other (comment) Additional Comments: adjustable bed with new rail to prevent falling out of bed again    Prior Function Prior Level of Function : History of Falls (last six months)              Mobility Comments: since home from [facility], has been AMB short distnaces without device in past couple weeks, inititally required RW; chronic orthostatic hypotension but reports midodrine has been insturmental ADLs Comments: , Pt reports independent with all ADL/IADL prior to hospital admission.     Extremity/Trunk Assessment                Communication   Communication Communication: Difficulty communicating thoughts/reduced clarity of speech  Cognition Arousal: Lethargic   Overall Cognitive Status: Impaired/Different from baseline Area of Impairment: Attention                   Current Attention Level: Selective           General Comments: follows all commands and oriented x4 however tangential speech and difficulty answering PLOF clearly        General Comments      Exercises     Assessment/Plan    PT Assessment Patient needs continued PT services  PT Problem List Decreased strength;Decreased range of motion;Decreased activity tolerance;Decreased balance;Decreased mobility;Decreased coordination;Decreased knowledge of use of DME;Decreased safety awareness;Decreased knowledge of precautions       PT Treatment Interventions DME instruction;Gait training;Stair training;Functional mobility training;Therapeutic activities;Therapeutic exercise;Balance training;Neuromuscular re-education;Patient/family education    PT Goals (Current goals can be found in the Care Plan section)  Acute Rehab PT Goals Patient Stated Goal: regain mobility and independence PT Goal Formulation: With patient Time For Goal Achievement: 08/12/23 Potential to Achieve Goals: Fair    Frequency 7X/week     Co-evaluation               AM-PAC PT "6 Clicks" Mobility  Outcome Measure Help needed turning from your back to your side while in a flat bed without using bedrails?: A Lot Help needed moving from lying on your back to sitting on the side of a flat bed without using  bedrails?: A Lot Help needed moving to and from a bed to a chair (including a wheelchair)?: A Lot Help needed standing up from a chair using your arms (e.g., wheelchair or bedside chair)?: A Lot Help needed to walk in hospital room?: A Lot Help needed climbing 3-5 steps with a railing? : A Lot 6 Click Score: 12    End of Session   Activity Tolerance: Patient tolerated treatment well;Patient limited by lethargy;No increased pain Patient left: in chair;with call bell/phone within reach;with family/visitor present Nurse Communication: Mobility status PT Visit Diagnosis: Unsteadiness on feet (R26.81);Difficulty in walking, not elsewhere classified (R26.2);Other abnormalities of gait and mobility (R26.89);Other symptoms and signs involving the nervous system (R29.898)    Time: 2956-2130 PT Time Calculation (min) (ACUTE ONLY): 29 min   Charges:   PT Evaluation $PT Eval High Complexity: 1 High PT Treatments $Therapeutic Activity: 8-22 mins PT General Charges $$ ACUTE PT VISIT: 1 Visit        2:44 PM, 07/29/23 Rosamaria Lints, PT, DPT Physical Therapist - [facility]  [phone] (ASCOM)    Pinon C  07/29/2023, 2:41 PM

## 2023-07-29 NOTE — Progress Notes (Signed)
Attending Progress Note  History: [name] is here for C3-6 LAMINOPLASTY, C2 DOME LAMINECTOMY, they are currently 1 Day Post-Op.   POD1: Stable overnight, expected pain, drain output bloody  Physical Exam: Vitals:   07/29/23 0509 07/29/23 0818  BP: (!) 121/56 (!) 102/53  Pulse: 79 83  Resp: 18 16  Temp: 98.6 F (37 C) 98.7 F (37.1 C)  SpO2: 98% 99%    AA Ox3 CNI  Strength:Notable weakness in right hand grip and intrinsic finger function at a 3 out of 5.  Patient does have difficulty with full range of motion right upper extremity which does make the exam more difficult but is 4+ to confrontation.  Data:  Recent Labs  Lab 07/28/23 1534  CREATININE 1.21*   No results for input(s): "AST", "ALT", "ALKPHOS" in the last 168 hours.  Invalid input(s): "TBILI"   Recent Labs  Lab 07/28/23 1534  WBC 9.5  HGB 9.6*  HCT 28.7*  PLT 167   No results for input(s): "APTT", "INR" in the last 168 hours.       Other tests/results: Intra-op fluror, C3-6 plating, decompression past C2 disk space  Assessment/Plan:  [name] 68 year old woman with a history of a cervical spinal cord injury secondary to longstanding cervical stenosis and an extension injury.  She had a central cord injury.  She did not have an immediate decompression due to family wishes, she instead went and had a planned delayed decompression after some physical therapy.  She is currently 1 Day Post-Op from C3-6 Laminoplasty and C2 Dome Laminectomy  - Drains: HVAC to Suction, 70cc Overnight, will keep, no brace needed. If necessary can do soft collar for comfort. - Encourage IS - mobilize - pain control - Remove foley - ADAT, Bowel Regimen while on narcotic pain medications - DVT prophylaxis - PTOT  [name], MD/MSCR Department of Neurosurgery

## 2023-07-30 ENCOUNTER — Encounter: Payer: Self-pay | Admitting: Neurosurgery

## 2023-07-30 LAB — GLUCOSE, CAPILLARY
Glucose-Capillary: 143 mg/dL — ABNORMAL HIGH (ref 70–99)
Glucose-Capillary: 65 mg/dL — ABNORMAL LOW (ref 70–99)
Glucose-Capillary: 159 mg/dL — ABNORMAL HIGH (ref 70–99)
Glucose-Capillary: 61 mg/dL — ABNORMAL LOW (ref 70–99)
Glucose-Capillary: 144 mg/dL — ABNORMAL HIGH (ref 70–99)
Glucose-Capillary: 83 mg/dL (ref 70–99)

## 2023-07-30 LAB — BASIC METABOLIC PANEL
Anion gap: 13 (ref 5–15)
BUN: 38 mg/dL — ABNORMAL HIGH (ref 8–23)
CO2: 23 mmol/L (ref 22–32)
Calcium: 9.5 mg/dL (ref 8.9–10.3)
Chloride: 104 mmol/L (ref 98–111)
Creatinine, Ser: 1.29 mg/dL — ABNORMAL HIGH (ref 0.44–1.00)
GFR, Estimated: 46 mL/min — ABNORMAL LOW (ref >60)
Glucose, Bld: 86 mg/dL (ref 70–99)
Potassium: 4.8 mmol/L (ref 3.5–5.1)
Sodium: 140 mmol/L (ref 135–145)

## 2023-07-30 LAB — CBC
HCT: 26.1 % — ABNORMAL LOW (ref 36.0–46.0)
Hemoglobin: 8.6 g/dL — ABNORMAL LOW (ref 12.0–15.0)
MCH: 28.6 pg (ref 26.0–34.0)
MCHC: 33 g/dL (ref 30.0–36.0)
MCV: 86.7 fL (ref 80.0–100.0)
Platelets: 167 10*3/uL (ref 150–400)
RBC: 3.01 MIL/uL — ABNORMAL LOW (ref 3.87–5.11)
RDW: 14 % (ref 11.5–15.5)
WBC: 8.4 10*3/uL (ref 4.0–10.5)
nRBC: 0 % (ref 0.0–0.2)

## 2023-07-30 MED ORDER — DEXTROSE 50 % IV SOLN
12.5000 g | INTRAVENOUS | Status: AC
  Administered 2023-07-30: 12.5 g via INTRAVENOUS
  Filled 2023-07-30: qty 50

## 2023-07-30 NOTE — Progress Notes (Signed)
Occupational Therapy Treatment Patient Details Name: [name] MRN: [id number] DOB: December 07, 1955 Today's Date: 07/30/2023   History of present illness [name] is a 67yoF who presents 1/21 for C3-6 cervical laminoplasty (posterior approach) and C2 inferior dome laminectomy s/p cervical spine central cord injury. Pt recently (Nov 2024) at [facility] in [place], therapy notes describe significant recurrent orthostatic hypotension without consistent symptomology, but notable functional limitations. Pt sustained a fall in October 2024 where she injured cervical spine, also sustained Rt pubic rami fractures, was also orthostatic while at [facility]. PMH: blood dyscrasia, DM2, diverticulosis, hypoTSH, neuropathy, renal insufficiency.   OT comments  [name] was seen for OT treatment on this date. Upon arrival to room pt in bed, agreeable to tx. Pt requires constant multi-modal cues to initiate movement and to attend to task. MOD A self-drinking at bed level. MAX A for BSC t/f and CGA to maintain sitting balance 2/2 anterior lean. Pt is lethargic t/o session limiting further participation this date. Pt making progress toward goals, will continue to follow POC. Discharge recommendation remains appropriate.       If plan is discharge home, recommend the following:  A lot of help with walking and/or transfers;A lot of help with bathing/dressing/bathroom;Help with stairs or ramp for entrance   Equipment Recommendations  Other (comment) (defer)    Recommendations for Other Services      Precautions / Restrictions Precautions Precautions: Cervical;Fall Restrictions Weight Bearing Restrictions Per Provider Order: No       Mobility Bed Mobility Overal bed mobility: Needs Assistance Bed Mobility: Rolling, Sidelying to Sit, Sit to Sidelying Rolling: Min assist Sidelying to sit: Mod assist     Sit to sidelying: Min assist General bed mobility comments: cues to initiate all movement     Transfers Overall transfer level: Needs assistance Equipment used: Rolling walker (2 wheels) Transfers: Bed to chair/wheelchair/BSC     Squat pivot transfers: Max assist             Balance Overall balance assessment: Needs assistance Sitting-balance support: No upper extremity supported, Feet supported Sitting balance-Leahy Scale: Poor Sitting balance - Comments: anterior lean   Standing balance support: Bilateral upper extremity supported Standing balance-Leahy Scale: Poor                             ADL either performed or assessed with clinical judgement   ADL Overall ADL's : Needs assistance/impaired                                       General ADL Comments: MOD A self-drinking at bed level. MAX A for BSC t/f    Extremity/Trunk Assessment Upper Extremity Assessment Upper Extremity Assessment: Difficult to assess due to impaired cognition             Cognition Arousal: Lethargic Behavior During Therapy: WFL for tasks assessed/performed Overall Cognitive Status: Impaired/Different from baseline Area of Impairment: Following commands, Awareness                       Following Commands: Follows one step commands inconsistently   Awareness: Intellectual                       Pertinent Vitals/ Pain       Pain Assessment Pain Assessment: 0-10 Pain Score: 10-Worst pain ever  Pain Location: B shoulders Pain Descriptors / Indicators: Discomfort, Grimacing, Radiating, Operative site guarding Pain Intervention(s): Repositioned, Patient requesting pain meds-RN notified   Frequency  Min 1X/week        Progress Toward Goals  OT Goals(current goals can now be found in the care plan section)  Progress towards OT goals: Progressing toward goals  Acute Rehab OT Goals OT Goal Formulation: With patient Time For Goal Achievement: 08/12/23 Potential to Achieve Goals: Good ADL Goals Pt Will Perform Grooming:  with modified independence;standing Pt Will Perform Lower Body Dressing: with modified independence;sit to/from stand Pt Will Transfer to Toilet: with modified independence;ambulating;regular height toilet  Plan      Co-evaluation                 AM-PAC OT "6 Clicks" Daily Activity     Outcome Measure   Help from another person eating meals?: A Lot Help from another person taking care of personal grooming?: A Little Help from another person toileting, which includes using toliet, bedpan, or urinal?: A Little Help from another person bathing (including washing, rinsing, drying)?: A Lot Help from another person to put on and taking off regular upper body clothing?: A Little Help from another person to put on and taking off regular lower body clothing?: A Lot 6 Click Score: 15    End of Session Equipment Utilized During Treatment: Gait belt  OT Visit Diagnosis: Unsteadiness on feet (R26.81);Muscle weakness (generalized) (M62.81)   Activity Tolerance Patient limited by lethargy;Patient limited by pain   Patient Left in bed;with call bell/phone within reach;with bed alarm set   Nurse Communication          Time: [phone] OT Time Calculation (min): 30 min  Charges: OT General Charges $OT Visit: 1 Visit OT Treatments $Self Care/Home Management : 23-37 mins  Kathie Dike, M.S. OTR/L  07/30/23, 3:30 PM  ascom [phone]

## 2023-07-30 NOTE — Plan of Care (Signed)
Patient lethargic but arousable. Patient aware of situation, place and time. Took medication without incident. Blood glucose levels wnl. Monitoring closely throughout shift.    Problem: Fluid Volume: Goal: Ability to maintain a balanced intake and output will improve Outcome: Progressing   Problem: Metabolic: Goal: Ability to maintain appropriate glucose levels will improve Outcome: Progressing   Problem: Nutritional: Goal: Maintenance of adequate nutrition will improve Outcome: Progressing   Problem: Clinical Measurements: Goal: Ability to maintain clinical measurements within normal limits will improve Outcome: Progressing Goal: Will remain free from infection Outcome: Progressing Goal: Diagnostic test results will improve Outcome: Progressing   Problem: Activity: Goal: Risk for activity intolerance will decrease Outcome: Progressing   Problem: Nutrition: Goal: Adequate nutrition will be maintained Outcome: Progressing   Problem: Pain Managment: Goal: General experience of comfort will improve and/or be controlled Outcome: Progressing   Problem: Safety: Goal: Ability to remain free from injury will improve Outcome: Progressing

## 2023-07-30 NOTE — Progress Notes (Signed)
Physical Therapy Treatment Patient Details Name: [name] MRN: [id number] DOB: [birth year] Today's Date: 07/30/2023   History of Present Illness [name] is a 67yoF who presents 1/21 for C3-6 cervical laminoplasty (posterior approach) and C2 inferior dome laminectomy s/p cervical spine central cord injury. Pt recently (Nov 2024) at [facility] in [place], therapy notes describe significant recurrent orthostatic hypotension without consistent symptomology, but notable functional limitations. Pt sustained a fall in October 2024 where she injured cervical spine, also sustained Rt pubic rami fractures, was also orthostatic while at [facility]. PMH: blood dyscrasia, DM2, diverticulosis, hypoTSH, neuropathy, renal insufficiency.    PT Comments  Pt in bed asleep on entry, HOB at 35 degrees, pt easily made awake, [name]/o inability to eat meal due to painful, absent head control. Pt is slightly more lethargic today than previous day, in that she is far less interactive verbally, and does not alert with mobility to EOB as previous day. Pt also more limited in following simple safety cues. Pt unable to safely partake in transfers this session despite steady sitting posture. Pt assisted back to bed totalA, then set up with [facility] @ 58 degrees to allow for more ergonomic self feeding setup, however pt appears asleep. VSS. RN at bedside at end of session. Author replaces SCD bilat. 5mg  midodrine found in bed, also given to RN. Will continue to follow.    If plan is discharge home, recommend the following: Two people to help with walking and/or transfers;Two people to help with bathing/dressing/bathroom;Supervision due to cognitive status;Direct supervision/assist for medications management;Assistance with feeding   Can travel by private vehicle     No  Equipment Recommendations  None recommended by PT    Recommendations for Other Services       Precautions / Restrictions Precautions Precautions:  Cervical;Fall Restrictions Weight Bearing Restrictions Per Provider Order: No     Mobility  Bed Mobility Overal bed mobility: Needs Assistance Bed Mobility: Supine to Sit     Supine to sit: Total assist, HOB elevated     General bed mobility comments: lethargic, inconsistently following commands, not openeing eyes much    Transfers Overall transfer level: Needs assistance (sat EOB x3 minutes, gave cues for hand placement several times, pt not following, continues to appear lethargic and more confused- ultiamtely decide to abondon efforts out of safety)                      Ambulation/Gait                   Stairs             Wheelchair Mobility     Tilt Bed    Modified Rankin (Stroke Patients Only)       Balance Overall balance assessment: Needs assistance   Sitting balance-Leahy Scale: Good Sitting balance - Comments: able to remain seated adn steayd without arms despite confusion adn somnolence                                    Cognition Arousal: Lethargic                                              Exercises      General Comments        Pertinent  Vitals/Pain Pain Assessment Pain Assessment: 0-10 Pain Score: 10-Worst pain ever Pain Location: headache adn neck Pain Intervention(s): Limited activity within patient's tolerance, Premedicated before session, Repositioned    Home Living                          Prior Function            PT Goals (current goals can now be found in the care plan section) Acute Rehab PT Goals Patient Stated Goal: regain mobility and independence PT Goal Formulation: With patient Time For Goal Achievement: 08/12/23 Potential to Achieve Goals: Fair Progress towards PT goals: Not progressing toward goals - comment    Frequency    7X/week      PT Plan      Co-evaluation              AM-PAC PT "6 Clicks" Mobility   Outcome  Measure  Help needed turning from your back to your side while in a flat bed without using bedrails?: Total Help needed moving from lying on your back to sitting on the side of a flat bed without using bedrails?: Total Help needed moving to and from a bed to a chair (including a wheelchair)?: Total Help needed standing up from a chair using your arms (e.g., wheelchair or bedside chair)?: Total Help needed to walk in hospital room?: Total Help needed climbing 3-5 steps with a railing? : Total 6 Click Score: 6    End of Session   Activity Tolerance: Patient limited by lethargy;Patient limited by pain Patient left: in bed;with call bell/phone within reach;with nursing/sitter in room;with SCD's reapplied (HOB at 55 degrees) Nurse Communication: Mobility status (5mg  midodrine found in bed) PT Visit Diagnosis: Unsteadiness on feet (R26.81);Difficulty in walking, not elsewhere classified (R26.2);Other abnormalities of gait and mobility (R26.89);Other symptoms and signs involving the nervous system (R29.898)     Time: 1610-9604 PT Time Calculation (min) (ACUTE ONLY): 24 min  Charges:    $Therapeutic Activity: 23-37 mins PT General Charges $$ ACUTE PT VISIT: 1 Visit                    4:15 PM, 07/30/23 Rosamaria Lints, PT, DPT Physical Therapist - [facility]  [phone] (ASCOM)    [name] 07/30/2023, 4:11 PM

## 2023-07-30 NOTE — Inpatient Diabetes Management (Signed)
Inpatient Diabetes Program Recommendations  AACE/ADA: New Consensus Statement on Inpatient Glycemic Control  Target Ranges:  Prepandial:   less than 140 mg/dL      Peak postprandial:   less than 180 mg/dL (1-2 hours)      Critically ill patients:  140 - 180 mg/dL    Latest Reference Range & Units 07/29/23 08:49 07/29/23 12:13 07/29/23 17:28 07/29/23 21:10 07/30/23 07:56 07/30/23 11:27  Glucose-Capillary 70 - 99 mg/dL 161 (H) 096 (H) 81 045 (H) 143 (H) 65 (L)    Review of Glycemic Control  Diabetes history: DM2 Outpatient Diabetes medications: Jardiance 25 mg daily, Glipizide 10 mg QAM, Actos 30 mg daily Current orders for Inpatient glycemic control: Jardiance 25 mg daily, Glipizide 10 mg QAM, Actos 30 mg daily, Novolog 0-15 units TID with meals, Novolog 0-5 units QHS  Inpatient Diabetes Program Recommendations:    Insulin: CBG 65 mg/dl at 40:98 am today. Please consider discontinuing Glipizide and Actos while inpatient.  Thanks, Orlando Penner, RN, MSN, CDCES Diabetes Coordinator Inpatient Diabetes Program [phone] (Team Pager from 8am to 5pm)

## 2023-07-30 NOTE — Plan of Care (Signed)
Patient had a hypoglycemic event during shift d/t not eating. She did not eat any meal, all she had was 8oz of orange juice and chocolate ensure. She remained drowsy and very sleepy all shift. She was snoring mid conversation. Pain was not managed properly even with oxycodone and dilaudid. Physician made aware of said happenings. Problem: Education: Goal: Ability to describe self-care measures that may prevent or decrease complications (Diabetes Survival Skills Education) will improve Outcome: Not Progressing   Problem: Fluid Volume: Goal: Ability to maintain a balanced intake and output will improve Outcome: Not Progressing   Problem: Metabolic: Goal: Ability to maintain appropriate glucose levels will improve Outcome: Not Progressing   Problem: Nutritional: Goal: Maintenance of adequate nutrition will improve Outcome: Not Progressing   Problem: Pain Managment: Goal: General experience of comfort will improve and/or be controlled Outcome: Not Progressing

## 2023-07-30 NOTE — TOC Initial Note (Signed)
Transition of Care [facility]) - Initial/Assessment Note    Patient Details  Name: [name] MRN: [id number] Date of Birth: Nov 03, 1955  [facility] CM/SW Contact:    Marlowe Sax, RN Phone Number: 07/30/2023, 11:14 AM  Clinical Narrative:    Met with the patient to discuss DC plan and needs She is in pain and not responding appropriately to questions, Called her husband Timmothy and he is agreeabe to a bed search               Expected Discharge Plan: Skilled Nursing Facility Barriers to Discharge: SNF Pending bed offer, Insurance Authorization   Patient Goals and CMS Choice Patient states their goals for this hospitalization and ongoing recovery are:: get better          Expected Discharge Plan and Services   Discharge Planning Services: CM Consult   Living arrangements for the past 2 months: Single Family Home                   DME Agency: NA       HH Arranged: NA          Prior Living Arrangements/Services Living arrangements for the past 2 months: Single Family Home Lives with:: Spouse Patient language and need for interpreter reviewed:: Yes Do you feel safe going back to the place where you live?: Yes      Need for Family Participation in Patient Care: Yes (Comment) Care giver support system in place?: Yes (comment)   Criminal Activity/Legal Involvement Pertinent to Current Situation/Hospitalization: No - Comment as needed  Activities of Daily Living   ADL Screening (condition at time of admission) Independently performs ADLs?: Yes (appropriate for developmental age) Is the patient deaf or have difficulty hearing?: No Does the patient have difficulty seeing, even when wearing glasses/contacts?: No Does the patient have difficulty concentrating, remembering, or making decisions?: No  Permission Sought/Granted   Permission granted to share information with : Yes, Verbal Permission Granted              Emotional  Assessment Appearance:: Appears stated age Attitude/Demeanor/Rapport: Engaged Affect (typically observed): Accepting Orientation: : Oriented to Self, Oriented to Place, Oriented to  Time, Oriented to Situation Alcohol / Substance Use: Not Applicable Psych Involvement: No (comment)  Admission diagnosis:  Cervical myelopathy (HCC) [G95.9] Patient Active Problem List   Diagnosis Date Noted   Cervical myelopathy (HCC) 07/28/2023   Spinal stenosis in cervical region 06/18/2023   Spinal cord injury at C5-C7 level without injury of spinal bone (HCC) 06/18/2023   Arm weakness 06/18/2023   Numbness and tingling 06/18/2023   Spasticity 06/15/2023   Orthostatic hypotension 06/15/2023   Esophageal stricture 05/01/2023   Dysphagia 04/30/2023   Odynophagia 04/30/2023   Gastroesophageal reflux disease 04/30/2023   Acute incomplete quadriplegia (HCC) 04/23/2023   Spinal cord injury, cervical region (HCC) 04/22/2023   Spinal cord compression, post-traumatic (HCC) 04/13/2023   Central cord syndrome (HCC) 04/13/2023   Muscle weakness of right upper extremity 04/13/2023   Weakness of right lower extremity 04/13/2023   Pelvic fracture (HCC) 04/13/2023   Cellulitis 08/12/2021   Facial cellulitis 08/11/2021   Dental caries 08/11/2021   COVID-19 virus infection 08/11/2021   Diabetes mellitus without complication (HCC)    Hypothyroidism    Stage 3a chronic kidney disease (HCC)    Non-dose-related adverse reaction to medication    Post-operative state 03/16/2017   AKI (acute kidney injury) (HCC) 01/18/2017   Acute respiratory failure with  hypoxia (HCC) 11/04/2015   PCP:  Jerl Mina, MD Pharmacy:   [facility] [address] [place] - [address] [place] [address] Phone: [phone] Fax: [phone]  [facility] [address] [place] [address] Phone: [phone] Fax: [phone]     Social Drivers of Health  (SDOH) Social History: SDOH Screenings   Food Insecurity: No Food Insecurity (07/28/2023)  Housing: Low Risk  (07/28/2023)  Transportation Needs: No Transportation Needs (07/28/2023)  Utilities: Not At Risk (07/28/2023)  Depression (PHQ2-9): Low Risk  (06/15/2023)  Social Connections: Moderately Integrated (07/28/2023)  Tobacco Use: Low Risk  (07/28/2023)   SDOH Interventions:     Readmission Risk Interventions     No data to display

## 2023-07-30 NOTE — [place] FL2 (Signed)
[place] MEDICAID [place]2 LEVEL OF CARE FORM     IDENTIFICATION  Patient Name: [name] Birthdate: [birth year] Sex: female Admission Date (Current Location): 07/28/2023  [facility] and [place] Number:  Chiropodist and Address:  [facility], [address] [place], [place] [address]      Provider Number: [id number]  Attending Physician Name and Address:  [name], MD  Relative Name and Phone Number:  [name]  [facility]  Emergency Contact  [phone]  [address]  [place] [address]    Current Level of Care: Hospital Recommended Level of Care: Skilled Nursing Facility Prior Approval Number:    Date Approved/Denied:   PASRR Number: [id number]  Discharge Plan: SNF    Current Diagnoses: Patient Active Problem List   Diagnosis Date Noted   Cervical myelopathy (HCC) 07/28/2023   Spinal stenosis in cervical region 06/18/2023   Spinal cord injury at C5-C7 level without injury of spinal bone (HCC) 06/18/2023   Arm weakness 06/18/2023   Numbness and tingling 06/18/2023   Spasticity 06/15/2023   Orthostatic hypotension 06/15/2023   Esophageal stricture 05/01/2023   Dysphagia 04/30/2023   Odynophagia 04/30/2023   Gastroesophageal reflux disease 04/30/2023   Acute incomplete quadriplegia (HCC) 04/23/2023   Spinal cord injury, cervical region (HCC) 04/22/2023   Spinal cord compression, post-traumatic (HCC) 04/13/2023   Central cord syndrome (HCC) 04/13/2023   Muscle weakness of right upper extremity 04/13/2023   Weakness of right lower extremity 04/13/2023   Pelvic fracture (HCC) 04/13/2023   Cellulitis 08/12/2021   Facial cellulitis 08/11/2021   Dental caries 08/11/2021   COVID-19 virus infection 08/11/2021   Diabetes mellitus without complication (HCC)    Hypothyroidism    Stage 3a chronic kidney disease (HCC)    Non-dose-related adverse reaction to medication    Post-operative state 03/16/2017   AKI  (acute kidney injury) (HCC) 01/18/2017   Acute respiratory failure with hypoxia (HCC) 11/04/2015    Orientation RESPIRATION BLADDER Height & Weight     Self, Time, Situation, Place  Normal Continent Weight: 58.9 kg Height:  5\' 7"  (170.2 cm)  BEHAVIORAL SYMPTOMS/MOOD NEUROLOGICAL BOWEL NUTRITION STATUS      Continent Diet (see dc summary)  AMBULATORY STATUS COMMUNICATION OF NEEDS Skin   Limited Assist Verbally Normal, Surgical wounds                       Personal Care Assistance Level of Assistance  Bathing, Feeding, Dressing Bathing Assistance: Limited assistance Feeding assistance: Limited assistance Dressing Assistance: Limited assistance     Functional Limitations Info  Sight, Hearing, Speech Sight Info: Adequate Hearing Info: Adequate Speech Info: Adequate    SPECIAL CARE FACTORS FREQUENCY  PT (By licensed PT), OT (By licensed OT)     PT Frequency: 5 times per week OT Frequency: 5 times per week            Contractures Contractures Info: Not present    Additional Factors Info  Code Status, Allergies Code Status Info: full code Allergies Info: ampicillin AMoxycillin           Current Medications (07/30/2023):  This is the current hospital active medication list Current Facility-Administered Medications  Medication Dose Route Frequency Provider Last Rate Last Admin   acetaminophen (TYLENOL) tablet 650 mg  650 mg Oral Q6H Joan Flores, PA-C   650 mg at 07/30/23 4401   Or   acetaminophen (TYLENOL) suppository 650 mg  650  mg Rectal Q6H Joan Flores, PA-C       alum & mag hydroxide-simeth (MAALOX/MYLANTA) 200-200-20 MG/5ML suspension 30 mL  30 mL Oral Q6H PRN Joan Flores, PA-C       bisacodyl (DULCOLAX) EC tablet 5 mg  5 mg Oral Daily PRN Frisbie, Brooke, PA-C       cyanocobalamin (VITAMIN B12) tablet 1,000 mcg  1,000 mcg Oral Daily Joan Flores, PA-C   1,000 mcg at 07/30/23 2595   cyclobenzaprine (FLEXERIL) tablet 10 mg  10 mg Oral QHS  Joan Flores, PA-C   10 mg at 07/29/23 2147   docusate sodium (COLACE) capsule 100 mg  100 mg Oral BID Joan Flores, PA-C   100 mg at 07/29/23 2147   empagliflozin (JARDIANCE) tablet 25 mg  25 mg Oral Daily Joan Flores, PA-C   25 mg at 07/30/23 0911   enoxaparin (LOVENOX) injection 40 mg  40 mg Subcutaneous Q24H Mila Merry A, RPH   40 mg at 07/30/23 6387   fludrocortisone (FLORINEF) tablet 0.2 mg  0.2 mg Oral Daily Joan Flores, PA-C   0.2 mg at 07/30/23 5643   gabapentin (NEURONTIN) capsule 200 mg  200 mg Oral BID Joan Flores, PA-C   200 mg at 07/30/23 0911   glipiZIDE (GLUCOTROL) tablet 10 mg  10 mg Oral QAC breakfast Joan Flores, PA-C   10 mg at 07/30/23 3295   HYDROmorphone (DILAUDID) injection 1 mg  1 mg Intravenous Q3H PRN Joan Flores, PA-C   1 mg at 07/30/23 1123   insulin aspart (novoLOG) injection 0-15 Units  0-15 Units Subcutaneous TID WC Joan Flores, PA-C   2 Units at 07/30/23 1884   insulin aspart (novoLOG) injection 0-5 Units  0-5 Units Subcutaneous QHS Joan Flores, PA-C   2 Units at 07/28/23 2206   lactase (LACTAID) tablet 3,000 Units  3,000 Units Oral QAC breakfast Joan Flores, PA-C   3,000 Units at 07/30/23 1660   levothyroxine (SYNTHROID) tablet 88 mcg  88 mcg Oral QAC breakfast Joan Flores, PA-C   88 mcg at 07/30/23 6301   menthol-cetylpyridinium (CEPACOL) lozenge 3 mg  1 lozenge Oral PRN Joan Flores, PA-C       Or   phenol (CHLORASEPTIC) mouth spray 1 spray  1 spray Mouth/Throat PRN Joan Flores, PA-C       midodrine (PROAMATINE) tablet 5 mg  5 mg Oral TID Frisbie, Brooke, PA-C   5 mg at 07/30/23 0911   ondansetron [facility]) injection 4 mg  4 mg Intravenous Once PRN Joan Flores, PA-C       oxyCODONE (Oxy IR/ROXICODONE) immediate release tablet 10 mg  10 mg Oral Q3H PRN Joan Flores, PA-C   10 mg at 07/30/23 1016   oxyCODONE (Oxy IR/ROXICODONE) immediate release tablet 5 mg  5 mg Oral Q3H PRN Joan Flores, PA-C   5 mg at  07/29/23 1900   pioglitazone (ACTOS) tablet 30 mg  30 mg Oral Daily Joan Flores, PA-C   30 mg at 07/30/23 6010   rosuvastatin (CRESTOR) tablet 5 mg  5 mg Oral QHS Frisbie, Brooke, PA-C   5 mg at 07/29/23 2147   senna (SENOKOT) tablet 8.6 mg  1 tablet Oral QHS Joan Flores, PA-C   8.6 mg at 07/29/23 2147   senna-docusate (Senokot-S) tablet 1 tablet  1 tablet Oral QHS PRN Joan Flores, PA-C       sodium chloride flush (NS) 0.9 % injection 3-10 mL  3-10 mL Intravenous Q12H Frisbie, Brooke, PA-C   10 mL at  07/30/23 0911   sodium chloride flush (NS) 0.9 % injection 3-10 mL  3-10 mL Intravenous PRN Joan Flores, PA-C         Discharge Medications: Please see discharge summary for a list of discharge medications.  Relevant Imaging Results:  Relevant Lab Results:   Additional Information SS# [id number]  Marlowe Sax, RN

## 2023-07-30 NOTE — Progress Notes (Signed)
Inpatient Rehab Admissions Coordinator:   Attempted to reach pt to discuss CIR but no answer.  Called spouse, went directly to voicemail and not set up to leave message.  Will continue efforts.  Pt mobilizing fairly well, but likely could benefit from short rehab stay if she is interested.    Estill Dooms, PT, DPT Admissions Coordinator [phone] 07/30/23  10:56 AM

## 2023-07-30 NOTE — Progress Notes (Signed)
Patient lethargic but arousable. Provider aware. Blood work being sent. Will continue to monitor from there.  07/30/23 2113  Assess: MEWS Score  Temp 98.2 F (36.8 C)  BP 111/60  MAP (mmHg) 76  Pulse Rate (!) 113  Resp 18  SpO2 96 %  O2 Device Room Air  Assess: MEWS Score  MEWS Temp 0  MEWS Systolic 0  MEWS Pulse 2  MEWS RR 0  MEWS LOC 0  MEWS Score 2  MEWS Score Color Yellow  Assess: if the MEWS score is Yellow or Red  Were vital signs accurate and taken at a resting state? Yes  Does the patient meet 2 or more of the SIRS criteria? No  MEWS guidelines implemented  Yes, yellow  Treat  MEWS Interventions Considered administering scheduled or prn medications/treatments as ordered  Take Vital Signs  Increase Vital Sign Frequency  Yellow: Q2hr x1, continue Q4hrs until patient remains green for 12hrs  Escalate  MEWS: Escalate Yellow: Discuss with charge nurse and consider notifying provider and/or RRT  Notify: Charge Nurse/RN  Name of Charge Nurse/RN Librarian, academic, RN  Assess: SIRS CRITERIA  SIRS Temperature  0  SIRS Respirations  0  SIRS Pulse 1  SIRS WBC 0  SIRS Score Sum  1

## 2023-07-31 ENCOUNTER — Other Ambulatory Visit: Payer: Self-pay | Admitting: Physician Assistant

## 2023-07-31 ENCOUNTER — Inpatient Hospital Stay: Payer: Medicare Other

## 2023-07-31 ENCOUNTER — Telehealth: Payer: Self-pay | Admitting: Neurosurgery

## 2023-07-31 DIAGNOSIS — R0602 Shortness of breath: Secondary | ICD-10-CM

## 2023-07-31 DIAGNOSIS — G959 Disease of spinal cord, unspecified: Secondary | ICD-10-CM | POA: Diagnosis not present

## 2023-07-31 DIAGNOSIS — R5383 Other fatigue: Secondary | ICD-10-CM

## 2023-07-31 DIAGNOSIS — R0902 Hypoxemia: Secondary | ICD-10-CM

## 2023-07-31 DIAGNOSIS — R1310 Dysphagia, unspecified: Secondary | ICD-10-CM

## 2023-07-31 LAB — GLUCOSE, CAPILLARY
Glucose-Capillary: 141 mg/dL — ABNORMAL HIGH (ref 70–99)
Glucose-Capillary: 167 mg/dL — ABNORMAL HIGH (ref 70–99)
Glucose-Capillary: 155 mg/dL — ABNORMAL HIGH (ref 70–99)
Glucose-Capillary: 148 mg/dL — ABNORMAL HIGH (ref 70–99)

## 2023-07-31 LAB — LACTIC ACID, PLASMA
Lactic Acid, Venous: 1.2 mmol/L (ref 0.5–1.9)
Lactic Acid, Venous: 1.8 mmol/L (ref 0.5–1.9)

## 2023-07-31 LAB — CBC
HCT: 26.8 % — ABNORMAL LOW (ref 36.0–46.0)
Hemoglobin: 8.8 g/dL — ABNORMAL LOW (ref 12.0–15.0)
MCH: 29 pg (ref 26.0–34.0)
MCHC: 32.8 g/dL (ref 30.0–36.0)
MCV: 88.4 fL (ref 80.0–100.0)
Platelets: 176 10*3/uL (ref 150–400)
RBC: 3.03 MIL/uL — ABNORMAL LOW (ref 3.87–5.11)
RDW: 13.8 % (ref 11.5–15.5)
WBC: 6.7 10*3/uL (ref 4.0–10.5)
nRBC: 0 % (ref 0.0–0.2)

## 2023-07-31 LAB — BRAIN NATRIURETIC PEPTIDE: B Natriuretic Peptide: 33.7 pg/mL (ref 0.0–100.0)

## 2023-07-31 MED ORDER — SODIUM CHLORIDE 0.9 % IV SOLN: INTRAVENOUS | Status: AC

## 2023-07-31 MED ORDER — GABAPENTIN 250 MG/5ML PO SOLN: 300.0000 mg | Freq: Three times a day (TID) | ORAL | Status: DC

## 2023-07-31 MED ORDER — SODIUM CHLORIDE 0.9 % IV SOLN
2.0000 g | Freq: Two times a day (BID) | INTRAVENOUS | Status: DC
  Administered 2023-07-31 – 2023-08-03 (×6): 2 g via INTRAVENOUS
  Filled 2023-07-31 (×7): qty 12.5

## 2023-07-31 MED ORDER — SODIUM CHLORIDE 0.9 % IV SOLN
500.0000 mg | INTRAVENOUS | Status: DC
  Filled 2023-07-31: qty 5

## 2023-07-31 MED ORDER — METRONIDAZOLE 500 MG/100ML IV SOLN
500.0000 mg | Freq: Two times a day (BID) | INTRAVENOUS | Status: DC
  Administered 2023-07-31 – 2023-08-03 (×6): 500 mg via INTRAVENOUS
  Filled 2023-07-31 (×7): qty 100

## 2023-07-31 MED ORDER — LEVALBUTEROL HCL 1.25 MG/0.5ML IN NEBU: 1.2500 mg | INHALATION_SOLUTION | Freq: Four times a day (QID) | RESPIRATORY_TRACT | Status: DC | PRN

## 2023-07-31 MED ORDER — IPRATROPIUM BROMIDE 0.02 % IN SOLN
0.5000 mg | Freq: Four times a day (QID) | RESPIRATORY_TRACT | Status: DC
  Administered 2023-07-31: 0.5 mg via RESPIRATORY_TRACT
  Filled 2023-07-31 (×2): qty 2.5

## 2023-07-31 MED ORDER — VANCOMYCIN HCL 750 MG/150ML IV SOLN
750.0000 mg | INTRAVENOUS | Status: DC
  Filled 2023-07-31: qty 150

## 2023-07-31 MED ORDER — BENZONATATE 100 MG PO CAPS: 100.0000 mg | ORAL_CAPSULE | Freq: Three times a day (TID) | ORAL | Status: DC | PRN

## 2023-07-31 MED ORDER — GUAIFENESIN 100 MG/5ML PO LIQD
5.0000 mL | Freq: Four times a day (QID) | ORAL | Status: DC
  Filled 2023-07-31 (×3): qty 10

## 2023-07-31 MED ORDER — RISAQUAD PO CAPS
2.0000 | ORAL_CAPSULE | Freq: Three times a day (TID) | ORAL | Status: DC
  Administered 2023-08-01: 2 via ORAL
  Filled 2023-07-31 (×18): qty 2

## 2023-07-31 MED ORDER — ACETAMINOPHEN 10 MG/ML IV SOLN
1000.0000 mg | Freq: Four times a day (QID) | INTRAVENOUS | Status: AC
  Administered 2023-07-31 – 2023-08-01 (×4): 1000 mg via INTRAVENOUS
  Filled 2023-07-31 (×4): qty 100

## 2023-07-31 MED ORDER — METHOCARBAMOL 1000 MG/10ML IJ SOLN
500.0000 mg | Freq: Four times a day (QID) | INTRAMUSCULAR | Status: DC | PRN
  Administered 2023-07-31 – 2023-08-01 (×2): 500 mg via INTRAVENOUS
  Filled 2023-07-31 (×2): qty 10

## 2023-07-31 MED ORDER — VANCOMYCIN HCL 1250 MG/250ML IV SOLN
1250.0000 mg | Freq: Once | INTRAVENOUS | Status: AC
  Administered 2023-07-31: 1250 mg via INTRAVENOUS
  Filled 2023-07-31: qty 250

## 2023-07-31 NOTE — Progress Notes (Signed)
Pharmacy Antibiotic Note  [name] is a 68 y.o. female admitted on 07/28/2023 with pneumonia. They presented with shortness of breath, coughing up clear phlegm, and soreness like chest pain on the mid chest. They are currently NPO due to high risk of aspiration. Pharmacy has been consulted for Vancomycin and Cefepime dosing.  Today, 07/31/2023 Scr 1.29 (baseline) WBC 6.7 Fever 100.1 F Chest review by MD: likely lower lobe infiltrates and consolidation   Plan: Day 1 of antibiotics Give vancomycin 1250 mg IV x1  Followed by Vancomycin 750 mg IV Q24H. Goal AUC 400-550. Expected AUC: 466.0 Expected Css min: 12.4 SCr used: 1.29  Weight used: TBW, Vd used: 0.72 (BMI 20.34) Patient is also on Cefepime 2 g IV Q12H Continue to monitor renal function and follow culture results  Height: 5\' 7"  (170.2 cm) Weight: 58.9 kg (129 lb 13.6 oz) IBW/kg (Calculated) : 61.6  Temp (24hrs), Avg:98.8 F (37.1 C), Min:98.1 F (36.7 C), Max:100.1 F (37.8 C)  Recent Labs  Lab 07/28/23 1534 07/30/23 2136 07/31/23 1457  WBC 9.5 8.4 6.7  CREATININE 1.21* 1.29*  --   LATICACIDVEN  --   --  1.2    Estimated Creatinine Clearance: 40.4 mL/min (A) (by C-G formula based on SCr of 1.29 mg/dL (H)).    Allergies  Allergen Reactions   Amoxicillin-Pot Clavulanate Itching and Anaphylaxis   Ampicillin Rash    Antimicrobials this admission: 1/24 Vancomycin >>  1/24 Cefepime >>   Dose adjustments this admission: NA  Microbiology results: 1/24 MRSA PCR: needs to be collected 1/24 Expectorated Sputum: needs to be collected  1/24 Strep pneumoniae: needs to be collected   Thank you for allowing pharmacy to be a part of this patient's care.  Effie Shy, PharmD Pharmacy Resident  07/31/2023 5:44 PM

## 2023-07-31 NOTE — Consult Note (Signed)
Initial Consultation Note   Patient: [name] WUJ:[id number] DOB: [birth year] PCP: Jerl Mina, MD DOA: 07/28/2023 DOS: the patient was seen and examined on 07/31/2023 Primary service: [name], MD  Referring physician: Joan Flores Reason for consult: Hypoxia  Assessment/Plan:  Acute hypoxic respiratory failure -Chest x-ray showed signs of atelectasis versus infiltrates on left lower lung.  Given that the patient also has a concurrent pleural chest pain, will also check a CT without contrast. -Continue current current oxygenation and depends on the CT chest result will decide whether patient needs antibiotics. -Appears that the patient has never used incentive spirometry.  Discussed with patient regarding importance of not with incentive spirometry to prevent postop atelectasis.  Patient expressed understanding. -Add Atrovent every 6 hours plus Xopenex as needed -Flutter valve -Guaifenesin and Tessalon  SIRS -Tachycardia, and hypoxic, will check lactic acid  IIDM -Agreed with SSI   C3-6 laminectomy plasty -As per primary team     TRH will continue to follow the patient.  HPI: [name] is a 68 y.o. female with past medical history of hypothyroidism, IIDM, presented to hospital for elective cervical spine surgery.  Patient underwent C3-6 laminoplasty and C2 dome and laminectomy surgery and today is POD 3.  Patient reported that feeling shortness of breath and cough since yesterday and coughing up clear phlegm, associated with soreness like chest pain on the left mid chest, denies any fever or chills.  Also noticed that the patient has not been using incentive spirometry abdominal surgery.  Patient denied any history of aspiration, no trouble swallowing.  Review of Systems: As mentioned in the history of present illness. All other systems reviewed and are negative. Past Medical History:  Diagnosis Date   Acute incomplete quadriplegia (HCC)    Acute  respiratory failure with hypoxia (HCC)    AKI (acute kidney injury) (HCC)    Arthritis    Blood dyscrasia    patient stated she is a "free bleeder"   Central cord syndrome (HCC)    Dental caries    Diabetes mellitus without complication (HCC)    Diverticulosis    Dysphagia    Esophageal stricture    Facial cellulitis    GERD (gastroesophageal reflux disease)    Hypothyroidism    Neuropathy    Odynophagia    Pelvic fracture (HCC)    Renal insufficiency    Spinal cord compression, post-traumatic (HCC)    Spinal cord injury, cervical region (HCC)    Stage 3a chronic kidney disease (CKD) (HCC)    Past Surgical History:  Procedure Laterality Date   ABDOMINAL HYSTERECTOMY     BILATERAL SALPINGECTOMY Bilateral 03/16/2017   Procedure: BILATERAL SALPINGECTOMY;  Surgeon: Schermerhorn, Ihor Austin, MD;  Location: [facility] ORS;  Service: Gynecology;  Laterality: Bilateral;   BIOPSY  05/03/2023   Procedure: BIOPSY;  Surgeon: Benancio Deeds, MD;  Location: [facility] ENDOSCOPY;  Service: Gastroenterology;;   COLONOSCOPY WITH PROPOFOL N/A 08/18/2018   Procedure: COLONOSCOPY WITH PROPOFOL;  Surgeon: Scot Jun, MD;  Location: [facility] ENDOSCOPY;  Service: Endoscopy;  Laterality: N/A;   CYSTOCELE REPAIR N/A 03/16/2017   Procedure: ANTERIOR REPAIR (CYSTOCELE);  Surgeon: Schermerhorn, Ihor Austin, MD;  Location: [facility] ORS;  Service: Gynecology;  Laterality: N/A;   ESOPHAGOGASTRODUODENOSCOPY (EGD) WITH PROPOFOL N/A 08/18/2018   Procedure: ESOPHAGOGASTRODUODENOSCOPY (EGD) WITH PROPOFOL;  Surgeon: Scot Jun, MD;  Location: [facility] ENDOSCOPY;  Service: Endoscopy;  Laterality: N/A;   ESOPHAGOGASTRODUODENOSCOPY (EGD) WITH PROPOFOL Left 05/01/2023   Procedure: ESOPHAGOGASTRODUODENOSCOPY (EGD) WITH PROPOFOL;  Surgeon: Benancio Deeds, MD;  Location: [facility] ENDOSCOPY;  Service: Gastroenterology;  Laterality: Left;   ESOPHAGOGASTRODUODENOSCOPY (EGD) WITH PROPOFOL N/A 05/03/2023   Procedure:  ESOPHAGOGASTRODUODENOSCOPY (EGD) WITH PROPOFOL;  Surgeon: Benancio Deeds, MD;  Location: [facility] ENDOSCOPY;  Service: Gastroenterology;  Laterality: N/A;   POSTERIOR CERVICAL LAMINECTOMY N/A 07/28/2023   Procedure: C2 DOME LAMINECTOMY;  Surgeon: [name], MD;  Location: [facility] ORS;  Service: Neurosurgery;  Laterality: N/A;   TEAR DUCT PROBING Right    TUBAL LIGATION     VAGINAL HYSTERECTOMY N/A 03/16/2017   Procedure: HYSTERECTOMY VAGINAL;  Surgeon: Schermerhorn, Ihor Austin, MD;  Location: [facility] ORS;  Service: Gynecology;  Laterality: N/A;   WISDOM TOOTH EXTRACTION     Social History:  reports that she has never smoked. She has never used smokeless tobacco. She reports that she does not drink alcohol and does not use drugs.  Allergies  Allergen Reactions   Amoxicillin-Pot Clavulanate Itching and Anaphylaxis   Ampicillin Rash    Family History  Problem Relation Age of Onset   Breast cancer Maternal Aunt    Hypertension Mother    Hypertension Father    Diabetes Father     Prior to Admission medications   Medication Sig Start Date End Date Taking? Authorizing Provider  acetaminophen (TYLENOL) 650 MG CR tablet Take 650 mg by mouth every 8 (eight) hours as needed for pain.   Yes [provider]  chlorhexidine (HIBICLENS) 4 % external liquid Apply 15 mLs (1 Application total) topically as directed for 30 doses. Use as directed daily for 5 days every other week for 6 weeks. 07/28/23  Yes Joan Flores, PA-C  cyanocobalamin (VITAMIN B12) 1000 MCG tablet Take 1,000 mcg by mouth daily.   Yes [provider]  cyclobenzaprine (FLEXERIL) 10 MG tablet Take 1 tablet (10 mg total) by mouth at bedtime. 06/15/23  Yes Lovorn, Aundra Millet, MD  diclofenac Sodium (VOLTAREN) 1 % GEL Apply 2 g topically 4 (four) times daily. Patient taking differently: Apply 2 g topically 4 (four) times daily as needed (pain). 05/11/23  Yes Setzer, Lynnell Jude, PA-C  fludrocortisone (FLORINEF) 0.1 MG tablet Take 2  tablets (0.2 mg total) by mouth daily. For orthostatic hypotension 06/15/23  Yes Lovorn, Aundra Millet, MD  gabapentin (NEURONTIN) 100 MG capsule Take 200 mg by mouth 2 (two) times daily.  08/16/15  Yes [provider]  glipiZIDE (GLUCOTROL) 5 MG tablet Take 10 mg by mouth daily before breakfast.   Yes [provider]  JARDIANCE 25 MG TABS tablet Take 25 mg by mouth daily.   Yes [provider]  lactase (LACTAID) 3000 units tablet Take 3,000 Units by mouth daily before breakfast.   Yes [provider]  levothyroxine (SYNTHROID) 88 MCG tablet Take 88 mcg by mouth daily before breakfast.   Yes [provider]  midodrine (PROAMATINE) 10 MG tablet Take 1.5 tablets (15 mg total) by mouth 3 (three) times daily with meals. For orthostatic hypotension 06/15/23  Yes Lovorn, Aundra Millet, MD  Multiple Vitamin (MULTIVITAMIN WITH MINERALS) TABS tablet Take 1 tablet by mouth daily. 04/22/23  Yes Sunnie Nielsen, DO  mupirocin ointment (BACTROBAN) 2 % Place 1 Application into the nose 2 (two) times daily for 60 doses. Use as directed 2 times daily for 5 days every other week for 6 weeks. 07/28/23 08/27/23 Yes Joan Flores, PA-C  pioglitazone (ACTOS) 30 MG tablet Take 30 mg by mouth daily.   Yes [provider]  Psyllium (METAMUCIL 3 IN 1 DAILY  FIBER PO) Take 2 capsules by mouth daily.   Yes [provider]  rosuvastatin (CRESTOR) 5 MG tablet Take 5 mg by mouth at bedtime.   Yes [provider]  senna (SENOKOT) 8.6 MG TABS tablet Take 1 tablet (8.6 mg total) by mouth at bedtime. 05/11/23  Yes Milinda Antis, PA-C    Physical Exam: Vitals:   07/30/23 2300 07/31/23 0256 07/31/23 0742 07/31/23 1110  BP: (!) 123/51 (!) 129/51 127/60 (!) 133/54  Pulse: (!) 117 (!) 117 (!) 122 (!) 122  Resp: 16 15 18 18   Temp: 100.1 F (37.8 C) 98.3 F (36.8 C) 99.8 F (37.7 C) 98.7 F (37.1 C)  TempSrc: Oral Oral Oral Oral  SpO2: 95% 94% 93% 91%  Weight:      Height:        Eyes: PERRL, lids and conjunctivae normal ENMT: Mucous membranes are moist. Posterior pharynx clear of any exudate or lesions.Normal dentition.  Neck: normal, supple, no masses, no thyromegaly Respiratory: clear to auscultation bilaterally, no wheezing, diminished breathing sound on left lower field with increasing crackles compared to right side, increasing respiratory effort. No accessory muscle use.  Cardiovascular: Regular rate and rhythm, no murmurs / rubs / gallops. No extremity edema. 2+ pedal pulses. No carotid bruits.  Abdomen: no tenderness, no masses palpated. No hepatosplenomegaly. Bowel sounds positive.  Musculoskeletal: no clubbing / cyanosis. No joint deformity upper and lower extremities. Good ROM, no contractures. Normal muscle tone.  Skin: no rashes, lesions, ulcers. No induration Neurologic: CN 2-12 grossly intact. Sensation intact, DTR normal.  Muscle strength 5/5 on both sides Psychiatric: Normal judgment and insight. Alert and oriented x 3. Normal mood.   Lethargic Data Reviewed:   Blood work including yesterday CBC BMP reviewed.  Today's chest x-ray film reviewed.   Family Communication: None at bedside Primary team communication: Neurosurgery team Thank you very much for involving [place] in the care of your patient.  Author: Emeline General, MD 07/31/2023 2:38 PM  For on call review [email or web address].

## 2023-07-31 NOTE — Telephone Encounter (Addendum)
Please let the husband know that Dr Katrinka Blazing is currently in the patient's room. She will eventually go to a skilled nursing facility or rehab, but not today. He will try to call her husband later.

## 2023-07-31 NOTE — Progress Notes (Signed)
   07/31/23 0742  Assess: MEWS Score  Temp 99.8 F (37.7 C)  BP 127/60  MAP (mmHg) 80  Pulse Rate (!) 122  Resp 18  Level of Consciousness Responds to Voice  SpO2 93 %  O2 Device Room Air  Patient Activity (if Appropriate) In bed  Assess: MEWS Score  MEWS Temp 0  MEWS Systolic 0  MEWS Pulse 2  MEWS RR 0  MEWS LOC 1  MEWS Score 3  MEWS Score Color Yellow  Assess: if the MEWS score is Yellow or Red  Were vital signs accurate and taken at a resting state? Yes  Does the patient meet 2 or more of the SIRS criteria? No  MEWS guidelines implemented  Yes, yellow  Treat  MEWS Interventions Considered administering scheduled or prn medications/treatments as ordered  Take Vital Signs  Increase Vital Sign Frequency  Yellow: Q2hr x1, continue Q4hrs until patient remains green for 12hrs  Escalate  MEWS: Escalate Yellow: Discuss with charge nurse and consider notifying provider and/or RRT  Notify: Charge Nurse/RN  Name of Charge Nurse/RN Notified Janie, RN  Provider Notification  Provider Name/Title Katrinka Blazing MD  Date Provider Notified 07/31/23  Time Provider Notified [phone]  Method of Notification Page  Notification Reason Other (Comment) (Yellow MEWS)  Provider response See new orders  Date of Provider Response 07/31/23  Time of Provider Response [id number]  Assess: SIRS CRITERIA  SIRS Temperature  0  SIRS Respirations  0  SIRS Pulse 1  SIRS WBC 0  SIRS Score Sum  1

## 2023-07-31 NOTE — Progress Notes (Addendum)
   Neurosurgery Progress Note  History: [name] is here for C2 dome laminectomy, C3-6 laminoplasty   POD3:Patient more alert this AM. However; hypoxia, tachycardia, SOB, low energy.  POD2: Very lethargic and difficult to arouse, states in quite a bit of pain. Medicine had been held due to sleepiness.    Physical Exam: Vitals:   07/31/23 0742 07/31/23 1110  BP: 127/60 (!) 133/54  Pulse: (!) 122 (!) 122  Resp: 18 18  Temp: 99.8 F (37.7 C) 98.7 F (37.1 C)  SpO2: 93% 91%    AA Ox3 CNI  Difficult to get great examination this morning, but patient with AROM of fingers and ability to range bilateral upper extremities. Baseline exam with 3/5 strength in right hand and largely 4+ elsewhere.  Data:  Output by Drain (mL) 07/29/23 0701 - 07/29/23 1900 07/29/23 1901 - 07/30/23 0700 07/30/23 0701 - 07/30/23 1900 07/30/23 1901 - 07/31/23 0700 07/31/23 0701 - 07/31/23 1250  Closed System Drain 1 Posterior Neck 10 Fr. 100   25      Assessment/Plan:  [name] POD3 s/p C2 dome laminectomy, C3-6 laminoplasty   - mobilize - pain control - DVT prophylaxis - PT/OT -HV drain pulled today. Patient tolerated well. -Lethargy, tachycardia, hypoxia noted. Bolus ordered as well as EKG and CXR. Hospitalist team and speech consulted. Speech recommended NPO including medications. As a result, IV Tylenol, Dilaudid, Robaxin. Appreciate recs by consulting teams.  Joan Flores PA-C Department of Neurosurgery

## 2023-07-31 NOTE — TOC Progression Note (Signed)
Transition of Care [facility]) - Progression Note    Patient Details  Name: [name] MRN: [id number] Date of Birth: 1955/08/01  Transition of [facility]) CM/SW Contact  Marlowe Sax, RN Phone Number: 07/31/2023, 9:37 AM  Clinical Narrative:    No Bed offers at this time, resent the bedsearch, they [facility] but [facility]   Expected Discharge Plan: Skilled Nursing Facility Barriers to Discharge: SNF Pending bed offer, Insurance Authorization  Expected Discharge Plan and Services   Discharge Planning Services: CM Consult   Living arrangements for the past 2 months: Single Family Home                   DME Agency: NA       HH Arranged: NA           Social Determinants of Health (SDOH) Interventions SDOH Screenings   Food Insecurity: No Food Insecurity (07/28/2023)  Housing: Low Risk  (07/28/2023)  Transportation Needs: No Transportation Needs (07/28/2023)  Utilities: Not At Risk (07/28/2023)  Depression (PHQ2-9): Low Risk  (06/15/2023)  Social Connections: Moderately Integrated (07/28/2023)  Tobacco Use: Low Risk  (07/28/2023)    Readmission Risk Interventions     No data to display

## 2023-07-31 NOTE — Care Management Important Message (Signed)
Important Message  Patient Details  Name: [name] MRN: [id number] Date of Birth: [birth year]   Important Message Given:  Yes - Medicare IM     Cristela Blue, CMA 07/31/2023, 10:20 AM

## 2023-07-31 NOTE — Progress Notes (Addendum)
CT chest reviewed, likely left lower lobe infiltrates and consolidation, compatible with HCAP.  Consult pharmacy to start cefepime and azithromycin Check sputum culture  Noted that the patient has been followed by speech therapist who consider patient high risk of aspiration.  Add Flagyl to cover anaerobes.  Continue n.p.o., add HOB 30 degree, continue aspiration precautions.  Noticed that her most recent MRSA screening was positive less than 1 months ago, will also add vancomycin with this case.  And DuoNebs and continue incentive symmetry and flutter valve.  Hospitalist team will follow tomorrow morning

## 2023-07-31 NOTE — Progress Notes (Signed)
Physical Therapy Treatment Patient Details Name: [name] MRN: [id number] DOB: April 03, 1956 Today's Date: 07/31/2023   History of Present Illness [name] is a 67yoF who presents 1/21 for C3-6 cervical laminoplasty (posterior approach) and C2 inferior dome laminectomy s/p cervical spine central cord injury. Pt recently (Nov 2024) at [facility] in [place], therapy notes describe significant recurrent orthostatic hypotension without consistent symptomology, but notable functional limitations. Pt sustained a fall in October 2024 where she injured cervical spine, also sustained Rt pubic rami fractures, was also orthostatic [facility] at [facility]. PMH: blood dyscrasia, DM2, diverticulosis, hypoTSH, neuropathy, renal insufficiency.    PT Comments  Generally lethargic.  "I'm confused"  but when asked by RN she is orientated to time/place/date and reason for admission.  She needs max cues to open eyes for session but completes session mostly with eyes closed.  Rolls to side lying then sitting with max a x 1.  She needs min progressing to mod a x 1 to remain sitting for about 4 minutes before needing to return to supine.  Max a to reposition in bed.  Of note sats 87% on room air upon arrival.  HR 122.  Called RN and pt was placed on 2 lpm with sats increasing to 92% but do decrease back to 88% with sitting.     If plan is discharge home, recommend the following: Two people to help with walking and/or transfers;Two people to help with bathing/dressing/bathroom;Supervision due to cognitive status;Direct supervision/assist for medications management;Assistance with feeding   Can travel by private vehicle     No  Equipment Recommendations  None recommended by PT    Recommendations for Other Services       Precautions / Restrictions Precautions Precautions: Cervical;Fall Restrictions Weight Bearing Restrictions Per Provider Order: No     Mobility  Bed Mobility Overal bed mobility: Needs Assistance Bed  Mobility: Supine to Sit, Sit to Supine Rolling: Mod assist Sidelying to sit: Max assist     Sit to sidelying: Max assist      Transfers                   General transfer comment: unsafe due to lethargy    Ambulation/Gait                   Stairs             Wheelchair Mobility     Tilt Bed    Modified Rankin (Stroke Patients Only)       Balance Overall balance assessment: Needs assistance Sitting-balance support: No upper extremity supported, Feet supported Sitting balance-Leahy Scale: Poor Sitting balance - Comments: +1 min/mod a to remain upright at EOB                                    Cognition Arousal: Lethargic Behavior During Therapy: Flat affect, WFL for tasks assessed/performed Overall Cognitive Status: Impaired/Different from baseline                                          Exercises      General Comments        Pertinent Vitals/Pain Pain Assessment Pain Assessment: Faces Faces Pain Scale: Hurts even more Pain Location: headache adn neck Pain Descriptors / Indicators: Discomfort, Grimacing, Radiating, Operative site guarding Pain Intervention(s):  Limited activity within patient's tolerance, Premedicated before session, Repositioned    Home Living                          Prior Function            PT Goals (current goals can now be found in the care plan section) Progress towards PT goals: Not progressing toward goals - comment    Frequency    7X/week      PT Plan      Co-evaluation              AM-PAC PT "6 Clicks" Mobility   Outcome Measure  Help needed turning from your back to your side while in a flat bed without using bedrails?: Total Help needed moving from lying on your back to sitting on the side of a flat bed without using bedrails?: Total Help needed moving to and from a bed to a chair (including a wheelchair)?: Total Help needed standing  up from a chair using your arms (e.g., wheelchair or bedside chair)?: Total Help needed to walk in hospital room?: Total Help needed climbing 3-5 steps with a railing? : Total 6 Click Score: 6    End of Session   Activity Tolerance: Patient limited by lethargy;Patient limited by pain Patient left: in bed;with call bell/phone within reach;with nursing/sitter in room;with SCD's reapplied (HOB at 55 degrees) Nurse Communication: Mobility status (5mg  midodrine found in bed) PT Visit Diagnosis: Unsteadiness on feet (R26.81);Difficulty in walking, not elsewhere classified (R26.2);Other abnormalities of gait and mobility (R26.89);Other symptoms and signs involving the nervous system (R29.898)     Time: 1610-9604 PT Time Calculation (min) (ACUTE ONLY): 17 min  Charges:    $Therapeutic Activity: 8-22 mins PT General Charges $$ ACUTE PT VISIT: 1 Visit         Danielle Dess, PTA 07/31/23, 11:02 AM

## 2023-07-31 NOTE — Telephone Encounter (Signed)
C3-6 laminoplasty, C2 dome laminectomy on 07/28/23  Patient's husband [name] is calling, he is concerned because her pain is level is high and she was told they are moving her to a rehab facility. He does not feel that she should go to rehab if her pain level is too high. He wants her to stay at the hospital. He is asking to talk to Dr. Katrinka Blazing? Also she is not able to eat due to her pain. He is on his way to see the patient. Will Dr.Smith go see his wife today?

## 2023-08-01 ENCOUNTER — Encounter
Admission: RE | Disposition: A | Discharge status: Nursing Home (Includes ICF) | Payer: Self-pay | Source: Ambulatory Visit | Attending: Neurosurgery

## 2023-08-01 ENCOUNTER — Inpatient Hospital Stay: Payer: Medicare Other | Admitting: General Practice

## 2023-08-01 ENCOUNTER — Inpatient Hospital Stay: Payer: Medicare Other

## 2023-08-01 DIAGNOSIS — G959 Disease of spinal cord, unspecified: Secondary | ICD-10-CM | POA: Diagnosis not present

## 2023-08-01 DIAGNOSIS — Z22322 Carrier or suspected carrier of Methicillin resistant Staphylococcus aureus: Secondary | ICD-10-CM | POA: Diagnosis not present

## 2023-08-01 DIAGNOSIS — E119 Type 2 diabetes mellitus without complications: Secondary | ICD-10-CM

## 2023-08-01 DIAGNOSIS — J181 Lobar pneumonia, unspecified organism: Secondary | ICD-10-CM | POA: Diagnosis not present

## 2023-08-01 HISTORY — PX: POSTERIOR CERVICAL LAMINECTOMY: SHX2248

## 2023-08-01 LAB — CBC
HCT: 24.3 % — ABNORMAL LOW (ref 36.0–46.0)
HCT: 22.4 % — ABNORMAL LOW (ref 36.0–46.0)
Hemoglobin: 8.1 g/dL — ABNORMAL LOW (ref 12.0–15.0)
Hemoglobin: 7.4 g/dL — ABNORMAL LOW (ref 12.0–15.0)
MCH: 29.1 pg (ref 26.0–34.0)
MCH: 29.5 pg (ref 26.0–34.0)
MCHC: 33.3 g/dL (ref 30.0–36.0)
MCHC: 33 g/dL (ref 30.0–36.0)
MCV: 87.4 fL (ref 80.0–100.0)
MCV: 89.2 fL (ref 80.0–100.0)
Platelets: 173 10*3/uL (ref 150–400)
Platelets: 174 10*3/uL (ref 150–400)
RBC: 2.78 MIL/uL — ABNORMAL LOW (ref 3.87–5.11)
RBC: 2.51 MIL/uL — ABNORMAL LOW (ref 3.87–5.11)
RDW: 13.8 % (ref 11.5–15.5)
RDW: 13.6 % (ref 11.5–15.5)
WBC: 6 10*3/uL (ref 4.0–10.5)
WBC: 5.8 10*3/uL (ref 4.0–10.5)
nRBC: 0 % (ref 0.0–0.2)
nRBC: 0 % (ref 0.0–0.2)

## 2023-08-01 LAB — BASIC METABOLIC PANEL
Anion gap: 13 (ref 5–15)
BUN: 47 mg/dL — ABNORMAL HIGH (ref 8–23)
CO2: 20 mmol/L — ABNORMAL LOW (ref 22–32)
Calcium: 9 mg/dL (ref 8.9–10.3)
Chloride: 108 mmol/L (ref 98–111)
Creatinine, Ser: 1.42 mg/dL — ABNORMAL HIGH (ref 0.44–1.00)
GFR, Estimated: 41 mL/min — ABNORMAL LOW (ref >60)
Glucose, Bld: 136 mg/dL — ABNORMAL HIGH (ref 70–99)
Potassium: 4.4 mmol/L (ref 3.5–5.1)
Sodium: 141 mmol/L (ref 135–145)

## 2023-08-01 LAB — PROTIME-INR
INR: 1.3 — ABNORMAL HIGH (ref 0.8–1.2)
Prothrombin Time: 16.4 s — ABNORMAL HIGH (ref 11.4–15.2)

## 2023-08-01 LAB — GLUCOSE, CAPILLARY
Glucose-Capillary: 125 mg/dL — ABNORMAL HIGH (ref 70–99)
Glucose-Capillary: 190 mg/dL — ABNORMAL HIGH (ref 70–99)
Glucose-Capillary: 144 mg/dL — ABNORMAL HIGH (ref 70–99)
Glucose-Capillary: 148 mg/dL — ABNORMAL HIGH (ref 70–99)

## 2023-08-01 LAB — MRSA NEXT GEN BY PCR, NASAL: MRSA by PCR Next Gen: DETECTED — AB

## 2023-08-01 LAB — APTT: aPTT: 46 s — ABNORMAL HIGH (ref 24–36)

## 2023-08-01 SURGERY — POSTERIOR CERVICAL LAMINECTOMY
Anesthesia: General

## 2023-08-01 MED ORDER — GLYCOPYRROLATE 0.2 MG/ML IJ SOLN
INTRAMUSCULAR | Status: AC
Start: 2023-08-01 — End: ?
  Filled 2023-08-01: qty 1

## 2023-08-01 MED ORDER — LACTATED RINGERS IV SOLN: INTRAVENOUS | Status: DC | PRN

## 2023-08-01 MED ORDER — BUPIVACAINE HCL (PF) 0.5 % IJ SOLN
INTRAMUSCULAR | Status: AC
  Filled 2023-08-01: qty 30

## 2023-08-01 MED ORDER — FENTANYL CITRATE (PF) 100 MCG/2ML IJ SOLN
INTRAMUSCULAR | Status: DC | PRN
  Administered 2023-08-01 – 2023-08-02 (×4): 50 ug via INTRAVENOUS

## 2023-08-01 MED ORDER — LIDOCAINE HCL (PF) 2 % IJ SOLN
INTRAMUSCULAR | Status: AC
  Filled 2023-08-01: qty 5

## 2023-08-01 MED ORDER — ORAL CARE MOUTH RINSE: 15.0000 mL | OROMUCOSAL | Status: DC | PRN

## 2023-08-01 MED ORDER — ORAL CARE MOUTH RINSE
15.0000 mL | OROMUCOSAL | Status: DC
  Administered 2023-08-01 – 2023-08-07 (×24): 15 mL via OROMUCOSAL

## 2023-08-01 MED ORDER — KETAMINE HCL 50 MG/5ML IJ SOSY
PREFILLED_SYRINGE | INTRAMUSCULAR | Status: DC | PRN
  Administered 2023-08-01 – 2023-08-02 (×2): 20 mg via INTRAVENOUS
  Administered 2023-08-02: 10 mg via INTRAVENOUS

## 2023-08-01 MED ORDER — DEXAMETHASONE SODIUM PHOSPHATE 10 MG/ML IJ SOLN
INTRAMUSCULAR | Status: AC
  Filled 2023-08-01: qty 1

## 2023-08-01 MED ORDER — PHENYLEPHRINE 80 MCG/ML (10ML) SYRINGE FOR IV PUSH (FOR BLOOD PRESSURE SUPPORT)
PREFILLED_SYRINGE | INTRAVENOUS | Status: AC
  Filled 2023-08-01: qty 10

## 2023-08-01 MED ORDER — ROCURONIUM BROMIDE 100 MG/10ML IV SOLN
INTRAVENOUS | Status: DC | PRN
  Administered 2023-08-01 (×2): 40 mg via INTRAVENOUS
  Administered 2023-08-01: 20 mg via INTRAVENOUS
  Administered 2023-08-02: 40 mg via INTRAVENOUS
  Administered 2023-08-02: 20 mg via INTRAVENOUS

## 2023-08-01 MED ORDER — PHENYLEPHRINE 80 MCG/ML (10ML) SYRINGE FOR IV PUSH (FOR BLOOD PRESSURE SUPPORT)
PREFILLED_SYRINGE | INTRAVENOUS | Status: DC | PRN
  Administered 2023-08-01 (×2): 80 ug via INTRAVENOUS

## 2023-08-01 MED ORDER — ALBUTEROL SULFATE HFA 108 (90 BASE) MCG/ACT IN AERS
INHALATION_SPRAY | RESPIRATORY_TRACT | Status: AC
  Filled 2023-08-01: qty 6.7

## 2023-08-01 MED ORDER — ROCURONIUM BROMIDE 10 MG/ML (PF) SYRINGE
PREFILLED_SYRINGE | INTRAVENOUS | Status: AC
  Filled 2023-08-01: qty 10

## 2023-08-01 MED ORDER — PROPOFOL 10 MG/ML IV BOLUS
INTRAVENOUS | Status: AC
  Filled 2023-08-01: qty 20

## 2023-08-01 MED ORDER — FENTANYL CITRATE (PF) 100 MCG/2ML IJ SOLN
INTRAMUSCULAR | Status: AC
  Filled 2023-08-01: qty 2

## 2023-08-01 MED ORDER — IPRATROPIUM BROMIDE 0.02 % IN SOLN
0.5000 mg | Freq: Two times a day (BID) | RESPIRATORY_TRACT | Status: DC
Start: 2023-08-01 — End: 2023-08-07
  Administered 2023-08-02 – 2023-08-07 (×11): 0.5 mg via RESPIRATORY_TRACT
  Filled 2023-08-01 (×11): qty 2.5

## 2023-08-01 MED ORDER — SUCCINYLCHOLINE CHLORIDE 200 MG/10ML IV SOSY
PREFILLED_SYRINGE | INTRAVENOUS | Status: AC
  Filled 2023-08-01: qty 10

## 2023-08-01 MED ORDER — VANCOMYCIN HCL 750 MG IV SOLR
750.0000 mg | INTRAVENOUS | Status: DC
  Filled 2023-08-01 (×2): qty 15

## 2023-08-01 MED ORDER — EPINEPHRINE PF 1 MG/ML IJ SOLN
INTRAMUSCULAR | Status: AC
  Filled 2023-08-01: qty 1

## 2023-08-01 MED ORDER — PHENYLEPHRINE HCL-NACL 20-0.9 MG/250ML-% IV SOLN
INTRAVENOUS | Status: DC | PRN
  Administered 2023-08-01: 40 ug/min via INTRAVENOUS

## 2023-08-01 MED ORDER — DEXAMETHASONE SODIUM PHOSPHATE 10 MG/ML IJ SOLN
INTRAMUSCULAR | Status: DC | PRN
  Administered 2023-08-01: 10 mg via INTRAVENOUS

## 2023-08-01 MED ORDER — SUCCINYLCHOLINE CHLORIDE 200 MG/10ML IV SOSY
PREFILLED_SYRINGE | INTRAVENOUS | Status: DC | PRN
  Administered 2023-08-01: 100 mg via INTRAVENOUS

## 2023-08-01 MED ORDER — KETAMINE HCL 50 MG/5ML IJ SOSY
PREFILLED_SYRINGE | INTRAMUSCULAR | Status: AC
  Filled 2023-08-01: qty 5

## 2023-08-01 MED ORDER — PHENYLEPHRINE HCL-NACL 20-0.9 MG/250ML-% IV SOLN
INTRAVENOUS | Status: AC
  Filled 2023-08-01: qty 250

## 2023-08-01 MED ORDER — IPRATROPIUM BROMIDE 0.02 % IN SOLN
0.5000 mg | Freq: Three times a day (TID) | RESPIRATORY_TRACT | Status: DC
  Administered 2023-08-01: 0.5 mg via RESPIRATORY_TRACT
  Filled 2023-08-01: qty 2.5

## 2023-08-01 MED ORDER — PROPOFOL 10 MG/ML IV BOLUS
INTRAVENOUS | Status: DC | PRN
  Administered 2023-08-01: 140 mg via INTRAVENOUS
  Administered 2023-08-02: 100 mg via INTRAVENOUS
  Administered 2023-08-02: 50 mg via INTRAVENOUS

## 2023-08-01 MED ORDER — ONDANSETRON HCL 4 MG/2ML IJ SOLN
INTRAMUSCULAR | Status: AC
  Filled 2023-08-01: qty 2

## 2023-08-01 MED ORDER — LIDOCAINE HCL (CARDIAC) PF 100 MG/5ML IV SOSY
PREFILLED_SYRINGE | INTRAVENOUS | Status: DC | PRN
  Administered 2023-08-01: 50 mg via INTRAVENOUS

## 2023-08-01 SURGICAL SUPPLY — 59 items
BASIN KIT SINGLE STR (MISCELLANEOUS) ×1 IMPLANT
BUR NEURO DRILL SOFT 3.0X3.8M (BURR) ×1 IMPLANT
BUR SABER DIAMOND 3.0 (BURR) IMPLANT
CHLORAPREP W/TINT 26 (MISCELLANEOUS) ×2 IMPLANT
COUNTER NEEDLE 20/40 LG (NEEDLE) ×1 IMPLANT
COVER LIGHT HANDLE STERIS (MISCELLANEOUS) ×2 IMPLANT
CUP MEDICINE 2OZ PLAST GRAD ST (MISCELLANEOUS) ×2 IMPLANT
DERMABOND ADVANCED .7 DNX12 (GAUZE/BANDAGES/DRESSINGS) ×1 IMPLANT
DRAPE C-ARM 42X72 X-RAY (DRAPES) ×2 IMPLANT
DRAPE C-ARMOR (DRAPES) ×1 IMPLANT
DRAPE INCISE IOBAN 66X45 STRL (DRAPES) ×1 IMPLANT
DRAPE MICROSCOPE SPINE 48X150 (DRAPES) IMPLANT
DRAPE THYROID T SHEET (DRAPES) ×1 IMPLANT
DRSG MEPILEX SACRM 8.7X9.8 (GAUZE/BANDAGES/DRESSINGS) IMPLANT
DRSG OPSITE POSTOP 4X14 (GAUZE/BANDAGES/DRESSINGS) IMPLANT
DRSG OPSITE POSTOP 4X6 (GAUZE/BANDAGES/DRESSINGS) IMPLANT
DRSG OPSITE POSTOP 4X8 (GAUZE/BANDAGES/DRESSINGS) IMPLANT
DRSG TEGADERM 4X4.75 (GAUZE/BANDAGES/DRESSINGS) IMPLANT
ELECT CAUTERY BLADE TIP 2.5 (TIP) ×1 IMPLANT
ELECTRODE CAUTERY BLDE TIP 2.5 (TIP) ×1 IMPLANT
FEE INTRAOP CADWELL SUPPLY NCS (MISCELLANEOUS) ×1 IMPLANT
FEE INTRAOP MONITOR IMPULS NCS (MISCELLANEOUS) IMPLANT
GAUZE 4X4 16PLY ~~LOC~~+RFID DBL (SPONGE) ×1 IMPLANT
GAUZE SPONGE 4X4 12PLY STRL (GAUZE/BANDAGES/DRESSINGS) ×1 IMPLANT
GLOVE BIOGEL PI IND STRL 6.5 (GLOVE) ×2 IMPLANT
GLOVE BIOGEL PI IND STRL 8 (GLOVE) ×1 IMPLANT
GLOVE SURG SYN 6.5 ES PF (GLOVE) ×2 IMPLANT
GLOVE SURG SYN 6.5 PF PI (GLOVE) ×2 IMPLANT
GLOVE SURG SYN 8.0 (GLOVE) ×2 IMPLANT
GLOVE SURG SYN 8.0 PF PI (GLOVE) ×2 IMPLANT
GOWN SRG LRG LVL 4 IMPRV REINF (GOWNS) ×2 IMPLANT
GOWN STRL REUS W/ TWL XL LVL3 (GOWN DISPOSABLE) ×2 IMPLANT
GRADUATE 1200CC STRL 31836 (MISCELLANEOUS) ×1 IMPLANT
INTRAOP CADWELL SUPPLY FEE NCS (MISCELLANEOUS) ×1 IMPLANT
INTRAOP MONITOR FEE IMPULS NCS (MISCELLANEOUS) IMPLANT
KIT TURNOVER KIT A (KITS) ×1 IMPLANT
MANIFOLD NEPTUNE II (INSTRUMENTS) ×1 IMPLANT
MARKER SKIN DUAL TIP RULER LAB (MISCELLANEOUS) ×2 IMPLANT
PACK LAMINECTOMY ARMC (PACKS) ×1 IMPLANT
PAD ARMBOARD 7.5X6 YLW CONV (MISCELLANEOUS) ×3 IMPLANT
PIN MAYFIELD SKULL DISP (PIN) ×1 IMPLANT
SPONGE DRAIN TRACH 4X4 STRL 2S (GAUZE/BANDAGES/DRESSINGS) IMPLANT
STAPLER SKIN PROX 35W (STAPLE) ×1 IMPLANT
SURGIFLO W/THROMBIN 8M KIT (HEMOSTASIS) ×1 IMPLANT
SUT ETHILON 3 0 PS 1 (SUTURE) ×1 IMPLANT
SUT NURALON 4 0 TR CR/8 (SUTURE) ×1 IMPLANT
SUT POLYSORB 2-0 5X18 GS-10 (SUTURE) ×1 IMPLANT
SUT PROLENE 5 0 RB 2 (SUTURE) IMPLANT
SUT VIC AB 0 CT1 18XCR BRD 8 (SUTURE) ×1 IMPLANT
SUT VIC AB 2-0 CT1 (SUTURE) IMPLANT
SUT VIC AB 2-0 CT1 18 (SUTURE) IMPLANT
SYR 30ML LL (SYRINGE) ×1 IMPLANT
TAPE CLOTH 3X10 WHT NS LF (GAUZE/BANDAGES/DRESSINGS) ×1 IMPLANT
TOWEL OR 17X26 4PK STRL BLUE (TOWEL DISPOSABLE) ×2 IMPLANT
TRAP FLUID SMOKE EVACUATOR (MISCELLANEOUS) ×1 IMPLANT
TRAY FOLEY MTR SLVR 16FR STAT (SET/KITS/TRAYS/PACK) IMPLANT
TUBING CONNECTING 10 (TUBING) ×1 IMPLANT
WAND WEREWOLF FASTSEAL 6.0 (MISCELLANEOUS) IMPLANT
WATER STERILE IRR 500ML POUR (IV SOLUTION) ×1 IMPLANT

## 2023-08-01 NOTE — Progress Notes (Signed)
Physical Therapy Treatment Patient Details Name: [name] MRN: [id number] DOB: 1956-06-24 Today's Date: 08/01/2023   History of Present Illness Dawnya Grams is a 67yoF who presents 1/21 for C3-6 cervical laminoplasty (posterior approach) and C2 inferior dome laminectomy s/p cervical spine central cord injury. Pt recently (Nov 2024) at [facility] in [place], therapy notes describe significant recurrent orthostatic hypotension without consistent symptomology, but notable functional limitations. Pt sustained a fall in October 2024 where she injured cervical spine, also sustained Rt pubic rami fractures, was also orthostatic while at [facility]. PMH: blood dyscrasia, DM2, diverticulosis, hypoTSH, neuropathy, renal insufficiency.    PT Comments  Pt was long sitting in bed upon arrival. Alert, taking her medications, prior to PT session beginning. Pt is oriented and cooperative however session limited by fatigue + neck pain/headache. Pt was able to roll L to short sit with increased time + assistance. She stood EOB 3 x prior to standing and taking steps along EOB to HOB. No LOB however pt c/o severe headache and requested to return to bed. Pt si progressing from a PT standpoint. During session, pt does have some expressive asphasia, word finding difficulty. MD/RN staff made aware of concerns. Acute PT will continue to follow and progress per current POC.    If plan is discharge home, recommend the following: A lot of help with walking and/or transfers;A lot of help with bathing/dressing/bathroom;Assistance with cooking/housework;Direct supervision/assist for medications management;Direct supervision/assist for financial management;Assist for transportation;Help with stairs or ramp for entrance;Supervision due to cognitive status     Equipment Recommendations  None recommended by PT (Defer to next level of care)       Precautions / Restrictions Precautions Precautions: Cervical;Fall Precaution Booklet  Issued: Yes (comment) Restrictions Weight Bearing Restrictions Per Provider Order: No     Mobility  Bed Mobility Overal bed mobility: Needs Assistance Bed Mobility: Supine to Sit, Sit to Supine Rolling: Mod assist Sidelying to sit: Mod assist Supine to sit: Mod assist, Used rails Sit to supine: Max assist, Used rails Sit to sidelying: Max assist, Used rails General bed mobility comments: flat bed. Vcs for log roll technqiue and sequencing.    Transfers Overall transfer level: Needs assistance Equipment used: Rolling walker (2 wheels) Transfers: Sit to/from Stand Sit to Stand: Min assist, Mod assist, From elevated surface  General transfer comment: Pt stood 3 x EOB. did take steps along EOB but c/o fatigued and requested to return to bed. no c/o dizziness however did c/o neck pain/headache. pt with some expressive aphasia noted. messaged MD team post session with concerns. no facial droop/ single limb strength < than opposite just expressive aphsia with difficulty with word finding    Ambulation/Gait  General Gait Details: pt was able to take steps along EOB with min assist once in standing    Balance Overall balance assessment: Needs assistance Sitting-balance support: No upper extremity supported, Feet supported Sitting balance-Leahy Scale: Poor     Standing balance support: Bilateral upper extremity supported Standing balance-Leahy Scale: Fair Standing balance comment: reliant on RW during dynamic standing activity     Cognition Arousal: Alert Behavior During Therapy: WFL for tasks assessed/performed Overall Cognitive Status: Impaired/Different from baseline    Current Attention Level: Focused   Following Commands: Follows one step commands consistently, Follows one step commands with increased time   Awareness: Intellectual   General Comments: follows all commands and oriented x4 however tangential speech and difficulty answering PLOF clearly  Pertinent Vitals/Pain Pain Assessment Pain Assessment: 0-10 Pain Score: 4  Pain Location: neck pain Pain Descriptors / Indicators: Discomfort, Grimacing, Radiating, Operative site guarding Pain Intervention(s): Limited activity within patient's tolerance, Monitored during session, Premedicated before session     PT Goals (current goals can now be found in the care plan section) Acute Rehab PT Goals Patient Stated Goal: none stated Progress towards PT goals: Progressing toward goals    Frequency    7X/week       AM-PAC PT "6 Clicks" Mobility   Outcome Measure  Help needed turning from your back to your side while in a flat bed without using bedrails?: A Lot Help needed moving from lying on your back to sitting on the side of a flat bed without using bedrails?: A Lot Help needed moving to and from a bed to a chair (including a wheelchair)?: A Lot Help needed standing up from a chair using your arms (e.g., wheelchair or bedside chair)?: A Lot Help needed to walk in hospital room?: Total Help needed climbing 3-5 steps with a railing? : Total 6 Click Score: 10    End of Session   Activity Tolerance: Patient tolerated treatment well;Patient limited by fatigue;Patient limited by pain Patient left: in bed;with call bell/phone within reach;with nursing/sitter in room;with SCD's reapplied Nurse Communication: Mobility status PT Visit Diagnosis: Unsteadiness on feet (R26.81);Difficulty in walking, not elsewhere classified (R26.2);Other abnormalities of gait and mobility (R26.89);Other symptoms and signs involving the nervous system (R29.898)     Time: 1610-9604 PT Time Calculation (min) (ACUTE ONLY): 18 min  Charges:    $Therapeutic Activity: 8-22 mins PT General Charges $$ ACUTE PT VISIT: 1 Visit                    Jetta Lout PTA 08/01/23, 1:45 PM

## 2023-08-01 NOTE — Progress Notes (Signed)
I saw [name] as she has new concern that she is not moving the left arm appropriately.  In talking to her, she is awake and responds to commands.  She appears to have intact speech as she is naming and is fluent.  Her right arm appears at baseline but she does not want to move arm on the left spontaneously.  When I provide noxious stimuli she demonstrates brisk localization with the left arm and that she does do spontaneous movements.  She seemed to increase her strength while was in the room.  However, she feels she cannot move her legs at this time.  I did provide noxious stimuli there and she did not move them.  Given this, I will get a stat CT of the head and cervical spine to rule out any new concerns.  Her blood pressures have been overall reasonable and she has not had any trauma as she has been in the bed.

## 2023-08-01 NOTE — Progress Notes (Signed)
Referring Physician:  [name], MD [address] [place],  [place] [address]  Primary Physician:  Jerl Mina, MD  History of Present Illness: 08/01/2023 [name] is here after her cervical laminoplasty that was done in a delayed fashion after a central cord injury for which she had inpatient rehab.  She underwent a cervical laminoplasty and was having neurologic stability and some signs of early neurologic improvement however they feel like today she had an acute decline in her neurologic examination starting in her left arm and then progressing into her bilateral lower extremities.  She states that this was also associated with a significant amount of pain.  A CT scan was performed and then an MRI was performed which demonstrated a likely epidural hematoma extending from the cervical spine to the low thoracic spine.  There is acute compression and T2 signal change so she is being taken to the OR emergently for surgical decompression  The symptoms are causing a significant impact on the patient's life.   Review of Systems:  A 10 point review of systems is negative, except for the pertinent positives and negatives detailed in the HPI.  Past Medical History: Past Medical History:  Diagnosis Date   Acute incomplete quadriplegia (HCC)    Acute respiratory failure with hypoxia (HCC)    AKI (acute kidney injury) (HCC)    Arthritis    Blood dyscrasia    patient stated she is a "free bleeder"   Central cord syndrome (HCC)    Dental caries    Diabetes mellitus without complication (HCC)    Diverticulosis    Dysphagia    Esophageal stricture    Facial cellulitis    GERD (gastroesophageal reflux disease)    Hypothyroidism    Neuropathy    Odynophagia    Pelvic fracture (HCC)    Renal insufficiency    Spinal cord compression, post-traumatic (HCC)    Spinal cord injury, cervical region (HCC)    Stage 3a chronic kidney disease (CKD) (HCC)     Past  Surgical History: Past Surgical History:  Procedure Laterality Date   ABDOMINAL HYSTERECTOMY     BILATERAL SALPINGECTOMY Bilateral 03/16/2017   Procedure: BILATERAL SALPINGECTOMY;  Surgeon: Schermerhorn, Ihor Austin, MD;  Location: [facility] ORS;  Service: Gynecology;  Laterality: Bilateral;   BIOPSY  05/03/2023   Procedure: BIOPSY;  Surgeon: Benancio Deeds, MD;  Location: [facility] ENDOSCOPY;  Service: Gastroenterology;;   COLONOSCOPY WITH PROPOFOL N/A 08/18/2018   Procedure: COLONOSCOPY WITH PROPOFOL;  Surgeon: Scot Jun, MD;  Location: [facility] ENDOSCOPY;  Service: Endoscopy;  Laterality: N/A;   CYSTOCELE REPAIR N/A 03/16/2017   Procedure: ANTERIOR REPAIR (CYSTOCELE);  Surgeon: Schermerhorn, Ihor Austin, MD;  Location: [facility] ORS;  Service: Gynecology;  Laterality: N/A;   ESOPHAGOGASTRODUODENOSCOPY (EGD) WITH PROPOFOL N/A 08/18/2018   Procedure: ESOPHAGOGASTRODUODENOSCOPY (EGD) WITH PROPOFOL;  Surgeon: Scot Jun, MD;  Location: [facility] ENDOSCOPY;  Service: Endoscopy;  Laterality: N/A;   ESOPHAGOGASTRODUODENOSCOPY (EGD) WITH PROPOFOL Left 05/01/2023   Procedure: ESOPHAGOGASTRODUODENOSCOPY (EGD) WITH PROPOFOL;  Surgeon: Benancio Deeds, MD;  Location: [facility] ENDOSCOPY;  Service: Gastroenterology;  Laterality: Left;   ESOPHAGOGASTRODUODENOSCOPY (EGD) WITH PROPOFOL N/A 05/03/2023   Procedure: ESOPHAGOGASTRODUODENOSCOPY (EGD) WITH PROPOFOL;  Surgeon: Benancio Deeds, MD;  Location: [facility] ENDOSCOPY;  Service: Gastroenterology;  Laterality: N/A;   POSTERIOR CERVICAL LAMINECTOMY N/A 07/28/2023   Procedure: C2 DOME LAMINECTOMY;  Surgeon: [name], MD;  Location: [facility] ORS;  Service: Neurosurgery;  Laterality: N/A;  TEAR DUCT PROBING Right    TUBAL LIGATION     VAGINAL HYSTERECTOMY N/A 03/16/2017   Procedure: HYSTERECTOMY VAGINAL;  Surgeon: Schermerhorn, Ihor Austin, MD;  Location: [facility] ORS;  Service: Gynecology;  Laterality: N/A;   WISDOM TOOTH EXTRACTION      Allergies: Allergies as of 06/24/2023  - Review Complete 06/17/2023  Allergen Reaction Noted   Amoxicillin-pot clavulanate Itching and Anaphylaxis 08/11/2021   Ampicillin Rash 10/28/2022    Medications: Outpatient Encounter Medications as of 06/17/2023  Medication Sig   acetaminophen (TYLENOL) 325 MG tablet Take 2 tablets (650 mg total) by mouth every 4 (four) hours as needed for mild pain (pain score 1-3) (temp > 101.5).   cholecalciferol (VITAMIN D3) 25 MCG (1000 UNIT) tablet Take 1,000 Units by mouth daily.   cyclobenzaprine (FLEXERIL) 10 MG tablet Take 1 tablet (10 mg total) by mouth at bedtime.   diclofenac Sodium (VOLTAREN) 1 % GEL Apply 2 g topically 4 (four) times daily.   docusate sodium (COLACE) 100 MG capsule Take 1 capsule (100 mg total) by mouth at bedtime.   fludrocortisone (FLORINEF) 0.1 MG tablet Take 2 tablets (0.2 mg total) by mouth daily. For orthostatic hypotension   gabapentin (NEURONTIN) 100 MG capsule Take 200 mg by mouth 2 (two) times daily.    glipiZIDE (GLUCOTROL) 5 MG tablet Take by mouth daily before breakfast.   JARDIANCE 25 MG TABS tablet Take 25 mg by mouth daily.   lactase (LACTAID) 3000 units tablet Take 3,000 Units by mouth daily before breakfast.   levothyroxine (SYNTHROID) 88 MCG tablet Take 88 mcg by mouth daily before breakfast.   metoCLOPramide (REGLAN) 10 MG tablet Take 1 tablet (10 mg total) by mouth every 8 (eight) hours as needed for nausea or vomiting.   midodrine (PROAMATINE) 10 MG tablet Take 1.5 tablets (15 mg total) by mouth 3 (three) times daily with meals. For orthostatic hypotension   Multiple Vitamin (MULTIVITAMIN WITH MINERALS) TABS tablet Take 1 tablet by mouth daily.   ondansetron (ZOFRAN-ODT) 4 MG disintegrating tablet Take 1 tablet (4 mg total) by mouth every 6 (six) hours as needed for nausea or vomiting.   pantoprazole (PROTONIX) 40 MG tablet Take 1 tablet (40 mg total) by mouth daily.   pioglitazone (ACTOS) 30 MG tablet Take 30 mg by mouth daily.   polyethylene glycol  (MIRALAX / GLYCOLAX) 17 g packet Take 17 g by mouth daily as needed for moderate constipation.   psyllium (REGULOID) 0.52 g capsule Take 0.52 g by mouth daily.   rosuvastatin (CRESTOR) 5 MG tablet Take 5 mg by mouth daily.   senna (SENOKOT) 8.6 MG TABS tablet Take 1 tablet (8.6 mg total) by mouth at bedtime.   vitamin B-12 (CYANOCOBALAMIN) 100 MCG tablet Take 100 mcg by mouth daily.   [DISCONTINUED] levothyroxine (SYNTHROID) 100 MCG tablet Take 100 mcg by mouth daily before breakfast.   No facility-administered encounter medications on file as of 06/17/2023.    Social History: Social History   Tobacco Use   Smoking status: Never   Smokeless tobacco: Never  Vaping Use   Vaping status: Never Used  Substance Use Topics   Alcohol use: No   Drug use: No    Family Medical History: Family History  Problem Relation Age of Onset   Breast cancer Maternal Aunt    Hypertension Mother    Hypertension Father    Diabetes Father     Physical Examination:   General: Patient is well developed, well nourished, calm, collected, and  in no apparent distress. Attention to examination is appropriate.   Respiratory: Patient is breathing without any difficulty.  Heart Rate Regular  NEUROLOGICAL:     Awake, alert, oriented to person, place, and time.  Speech is clear and fluent. Fund of knowledge is appropriate.   Cranial Nerves: Pupils equal round and reactive to light.  Facial tone is symmetric.  Facial sensation is symmetric.  ROM of spine: some tenderness to palpation of cervical spine  Strength: Patient still has volitional activity in her right upper extremity.  Still has her baseline right upper extremity weakness especially in her hand.  The left upper extremity is showing some flicker like movements and some 1-2 out of 5 in the left upper extremity elbow flexion.  Lower extremities show no activation, family did say that they had seen her having small intermittent movements in her  bilateral lower extremities.  It was not observed by this examiner.  She has decreased sensation starting at approximately the C6 level on the left and then distally  Medical Decision Making  Imaging: Narrative & Impression  CLINICAL DATA:  Spine surgery/procedure. Postoperative. Infection suspected.   EXAM: CT CERVICAL SPINE WITHOUT CONTRAST   TECHNIQUE: Multidetector CT imaging of the cervical spine was performed without intravenous contrast. Multiplanar CT image reconstructions were also generated.   RADIATION DOSE REDUCTION: This exam was performed according to the departmental dose-optimization program which includes automated exposure control, adjustment of the mA and/or kV according to patient size and/or use of iterative reconstruction technique.   COMPARISON:  Cervical spine radiographs intraoperative fluoroscopy 07/28/2023, cervical spine radiographs 06/17/2023; MRI cervical spine 04/13/2023, CT cervical spine 04/13/2023   FINDINGS: Alignment: No sagittal spondylolisthesis. The atlantodens interval is intact. The facet joints are appropriately aligned.   Skull base and vertebrae: Vertebral body heights are maintained. There are 2 new postsurgical changes of right C3 through C6 laminoplasty. There is malleable plate and screw fixation of the right laminoplasty at each of these levels. There is also lucency from a posterior left C3 lamina defect but with the anterior cortex minimally fused/intact (axial series 5, image 36).   There is a complete defect within the posterior greater than anterior cortex of the left C4 lamina (axial series 5, image 41), left C5 lamina (axial series 5, image 48), and left C6 lamina (axial series 5, image 57). This is consistent with left lamina "hinge fractures" as can happen during laminoplasty surgery but the majority of cases eventually fuse/heal.   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   Soft tissues and spinal  canal: No central canal hyperdensity is seen to indicate CT evidence of an acute epidural hematoma. No prevertebral soft tissue swelling.   Disc levels:   C2-3: Uncovertebral and facet joint hypertrophy again contribute to mild-to-moderate left neuroforaminal stenosis. Interval resection of a portion of the posterior arch of C2 (axial series 2, image 33) and resection of an ossicle at the posterior aspect of the central canal. Improved central canal space.   C3-4: Facet joint and uncovertebral hypertrophy contribute to moderate to severe left neuroforaminal stenosis. Mild right neuroforaminal stenosis. The central canal space is improved.   C4-5: Moderate bilateral facet joint hypertrophy. Interval right laminoplasty. Moderate bilateral neuroforaminal stenosis. Improved central canal space.   C5-6: Bilateral facet joint and uncovertebral hypertrophy with mild bilateral neuroforaminal stenosis. Interval right laminoplasty with improved central canal space.   C6-7: Mild bilateral facet joint hypertrophy. Mild left neuroforaminal narrowing. Interval laminoplasty with patent central canal.  C7-T1 and T1-T2 are unremarkable.   Upper chest: There is interstitial scarring within the bilateral lung apices, similar to prior.   Other: No cervical chain lymphadenopathy. Status post thyroidectomy.   IMPRESSION: 1. Interval right C3 through C6 laminoplasty with improved central canal space at these levels. 2. There are complete defects within the posterior greater than anterior cortices of the left C4, C5, and C6 lamina and a partial posterior C3 lamina cortical defect consistent with left lamina "hinge fractures" as can occur during laminoplasty surgery. The majority of cases eventually fuse/heal. Recommend continued follow-up. 3. Interval resection of a portion of the posterior arch of C2 and resection of an ossicle at the posterior aspect of the central canal at C2-3. Improved  central canal space at this level. 4. Moderate to severe left neuroforaminal stenosis at C3-4. Moderate bilateral neuroforaminal stenosis at C4-5.     Electronically Signed   By: Neita Garnet M.D.   On: 08/01/2023 17:32   Narrative & Impression  CLINICAL DATA:  Initial evaluation for acute myelopathy, recent surgery.   EXAM: MRI CERVICAL AND THORACIC SPINE WITHOUT CONTRAST   TECHNIQUE: Multiplanar and multiecho pulse sequences of the cervical spine, to include the craniocervical junction and cervicothoracic junction, and the thoracic spine, were obtained without intravenous contrast.   COMPARISON:  CT from earlier the same day.   FINDINGS: MRI CERVICAL SPINE FINDINGS   Alignment: Straightening of the normal cervical lordosis. No significant listhesis.   Vertebrae: Postoperative changes from recent C3 through C6 laminectomy with laminoplasty. Associated fractures better appreciated on prior CT. No acute vertebral body fracture. Bone marrow signal intensity within normal limits. No worrisome osseous lesions.   Cord: Lobulated heterogeneous epidural collection seen within the dorsal epidural space, likely reflecting epidural hematoma related to recent surgery. Collection extends from C2-3 into the upper thoracic spine, terminating at the level of T9 on corresponding thoracic spine MRI. Resultant severe diffuse spinal stenosis with flattening of the posterior spinal cord. No convincing cord signal changes within the cervical spine. Stenosis is maximal at the level of C4-5 where the thecal sac measures 3 mm in AP diameter on the left (series 19, image 14).   Posterior Fossa, vertebral arteries, paraspinal tissues: Craniocervical junction within normal limits. Visualized brain and posterior fossa normal. Edema within the posterior paraspinous soft tissues, presumably postoperative in nature. No loculated soft tissue collections. Preserved flow voids seen within the  vertebral arteries bilaterally.   Disc levels:   C2-C3: Broad-based posterior disc bulge. Small volume posterior epidural hematoma at this level. Moderate to severe spinal stenosis with mild cord flattening. Foramina remain patent.   C3-C4: Left paracentral disc osteophyte complex indents the ventral thecal sac. Postoperative changes with dorsal epidural hematoma. Severe spinal stenosis, worse on the left. Secondary cord flattening without convincing cord signal changes. Severe left C4 foraminal narrowing. Right neural foramen remains patent.   C4-C5: Posterior disc osteophyte complex flattens and partially faces the ventral thecal sac, asymmetric to the left. Postoperative changes with dorsal epidural hematoma. Severe spinal stenosis with flattening of the cervical cord, greater on the left. No convincing cord signal changes. Moderate bilateral C5 foraminal narrowing.   C5-C6: Diffuse disc bulge with bilateral uncovertebral spurring. Postoperative changes with dorsal epidural hematoma. Moderate to severe spinal stenosis with mild cord flattening but no cord signal changes. Mild bilateral C6 foraminal narrowing.   C6-C7: Right paracentral disc osteophyte complex flattens the ventral thecal sac. Dorsal epidural hematoma. Secondary cord flattening without cord signal changes. Severe  spinal stenosis. Mild right C7 foraminal narrowing. Left neural foramina remains patent.   C7-T1: Small central to left paracentral disc protrusion. Dorsal epidural hematoma. Secondary cord flattening without cord signal changes. Severe spinal stenosis. Foramina remain patent.   MRI THORACIC SPINE FINDINGS   Alignment: Physiologic with preservation of the normal thoracic kyphosis. No listhesis.   Vertebrae: Vertebral body height maintained without acute or chronic fracture. Bone marrow signal intensity within normal limits. No worrisome osseous lesions. No abnormal marrow edema.   Cord: Above  described dorsal epidural collection extends inferiorly to the level of T9 (series 19, image 9). Secondary diffuse severe spinal stenosis at these levels with cord compression. Suspected subtle patchy cord signal changes at the levels of T1-2 (series 20, image 6), T5-6 (series 20, image 17), T7-8 (series 20, image 22). 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   Paraspinal and other soft tissues: Edema within the upper thoracic posterior paraspinous soft tissues, likely postoperative. No collections.   Disc levels:   Underlying ordinary for age multilevel disc desiccation within the upper and midthoracic spine. Small right paracentral disc protrusion noted at T5-6 (series 20, image 17). No other significant focal disc herniation. Foramina remain patent.   IMPRESSION: 1. Postoperative changes from recent C3 through C6 laminectomy with laminoplasty. Associated fractures better appreciated on prior CT. 2. Dorsal epidural collection extending from C2-3 through T9, likely epidural hematoma. Resultant severe diffuse spinal stenoses stenosis with cord compression at these levels. 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 Emergent neuro surgical 3. Underlying multilevel cervical spondylosis with resultant multilevel foraminal narrowing as above. Notable findings include severe left C4 foraminal stenosis, moderate bilateral C5 foraminal narrowing, with mild bilateral C6 and right C7 foraminal stenosis.   Critical Value/emergent results were called by telephone at the time of interpretation on 08/01/2023 at 7:39 pm to provider [facility] [address] , who verbally acknowledged these results.     Electronically Signed   By: Rise Mu M.D.   On: 08/01/2023 19:41     Assessment and Plan: [name] is a pleasant 68 y.o. female is going to the operating room emergently for a decompression of an  epidural hematoma.  She was recovering from a cervical laminoplasty that was performed in the delayed fashion after presentation with a cervical spinal cord injury.  She was having a significant amount of pain and discomfort but neurologically was showing some subtle signs of recovery.  The family and her state that earlier today she feels like she had an acute decline with a severe onset of new pain and left upper extremity weakness.  This rapidly progressed to lower extremity weakness.  Cross-sectional imaging was obtained which demonstrated improved canal with on the bony images of the CT scan, given her continued weakness she was taken for an emergent MRI of her cervical spine and thoracic spine.  This demonstrated a likely epidural hematoma.  Given the pattern of T2 and T1 intensity patterns this appears to be a hyperacute hematoma which explains why it was not well-demonstrated on the CT scan.  It also correlates with her relatively rapid decline earlier today.  We are taking her to the OR emergently for cervical decompression and evacuation of epidural hematoma.  Will plan for skip laminectomy/laminotomy in her thoracic spine to decompress/evacuate as much of the hematoma as possible.  We discussed and reviewed these findings with the family as well as the patient and performed a physical exam  prior to going to the operating room which is outlined above.  We had a discussion with the patient's daughter, husband, and herself and were in agreement to go forward with emergent decompression.  We discussed that we would not know how she did neurologically until a significant time after her decompression, and we would not expect her to have an immediate return of function to her baseline.  We reiterated the severity of the current spinal cord injury and the seriousness of the situation with the acute compressive hematoma.  This was the reason we are taking her emergently to the OR  Ernestine Mcmurray,  MD [facility] [place]

## 2023-08-01 NOTE — Evaluation (Addendum)
Clinical/Bedside Swallow Evaluation Patient Details  Name: [name] MRN: [id number] Date of Birth: 03-08-1956  Today's Date: 08/01/2023 Time: SLP Start Time (ACUTE ONLY): 1000 SLP Stop Time (ACUTE ONLY): 1100 SLP Time Calculation (min) (ACUTE ONLY): 60 min  Past Medical History:  Past Medical History:  Diagnosis Date   Acute incomplete quadriplegia (HCC)    Acute respiratory failure with hypoxia (HCC)    AKI (acute kidney injury) (HCC)    Arthritis    Blood dyscrasia    patient stated she is a "free bleeder"   Central cord syndrome (HCC)    Dental caries    Diabetes mellitus without complication (HCC)    Diverticulosis    Dysphagia    Esophageal stricture    Facial cellulitis    GERD (gastroesophageal reflux disease)    Hypothyroidism    Neuropathy    Odynophagia    Pelvic fracture (HCC)    Renal insufficiency    Spinal cord compression, post-traumatic (HCC)    Spinal cord injury, cervical region (HCC)    Stage 3a chronic kidney disease (CKD) (HCC)    Past Surgical History:  Past Surgical History:  Procedure Laterality Date   ABDOMINAL HYSTERECTOMY     BILATERAL SALPINGECTOMY Bilateral 03/16/2017   Procedure: BILATERAL SALPINGECTOMY;  Surgeon: Schermerhorn, Ihor Austin, MD;  Location: [facility] ORS;  Service: Gynecology;  Laterality: Bilateral;   BIOPSY  05/03/2023   Procedure: BIOPSY;  Surgeon: Benancio Deeds, MD;  Location: [facility] ENDOSCOPY;  Service: Gastroenterology;;   COLONOSCOPY WITH PROPOFOL N/A 08/18/2018   Procedure: COLONOSCOPY WITH PROPOFOL;  Surgeon: Scot Jun, MD;  Location: [facility] ENDOSCOPY;  Service: Endoscopy;  Laterality: N/A;   CYSTOCELE REPAIR N/A 03/16/2017   Procedure: ANTERIOR REPAIR (CYSTOCELE);  Surgeon: Schermerhorn, Ihor Austin, MD;  Location: [facility] ORS;  Service: Gynecology;  Laterality: N/A;   ESOPHAGOGASTRODUODENOSCOPY (EGD) WITH PROPOFOL N/A 08/18/2018   Procedure: ESOPHAGOGASTRODUODENOSCOPY (EGD) WITH PROPOFOL;  Surgeon: Scot Jun,  MD;  Location: [facility] ENDOSCOPY;  Service: Endoscopy;  Laterality: N/A;   ESOPHAGOGASTRODUODENOSCOPY (EGD) WITH PROPOFOL Left 05/01/2023   Procedure: ESOPHAGOGASTRODUODENOSCOPY (EGD) WITH PROPOFOL;  Surgeon: Benancio Deeds, MD;  Location: [facility] ENDOSCOPY;  Service: Gastroenterology;  Laterality: Left;   ESOPHAGOGASTRODUODENOSCOPY (EGD) WITH PROPOFOL N/A 05/03/2023   Procedure: ESOPHAGOGASTRODUODENOSCOPY (EGD) WITH PROPOFOL;  Surgeon: Benancio Deeds, MD;  Location: [facility] ENDOSCOPY;  Service: Gastroenterology;  Laterality: N/A;   POSTERIOR CERVICAL LAMINECTOMY N/A 07/28/2023   Procedure: C2 DOME LAMINECTOMY;  Surgeon: [name], MD;  Location: [facility] ORS;  Service: Neurosurgery;  Laterality: N/A;   TEAR DUCT PROBING Right    TUBAL LIGATION     VAGINAL HYSTERECTOMY N/A 03/16/2017   Procedure: HYSTERECTOMY VAGINAL;  Surgeon: Schermerhorn, Ihor Austin, MD;  Location: [facility] ORS;  Service: Gynecology;  Laterality: N/A;   WISDOM TOOTH EXTRACTION     HPI:  [name] is a 68 y.o. female with past medical history of hypothyroidism, IIDM, presented to hospital for elective cervical spine surgery.  Patient underwent C3-6 laminoplasty and C2 dome and laminectomy surgery (today is POD 4). CT Chest: Left lower lobe pneumonia without sizable effusion. Pt currently NPO    Assessment / Plan / Recommendation  Clinical Impression  Pt with known mild oropharyngeal dysphagia with esophageal complications. EGD completed 05/03/23, revealing esophageal stricture requiring dilation. Pt recently seen by SLP in CIR with recommendations of Dys 3 and thin liquids at time of discharge on 10/24. MBSS completed on 10/23, revealing mild oropharyngeal dysphagia, "mild oropharyngeal dysphagia primarily impacted by  upwards impact of esophageal deficits. Orally, patient underwent examination without dentures, thus oral deficits of prolonged mastication and bolus preparation were observed with regular solids. Patient at bedside  reports need to "bear down" to swallow, and given esophageal retention and stasis observed at multiple levels, suspect impaired pressure system across the overall swallow has resulted in delay in lingual motion to transport bolus posteriorly in the oral cavity and reduced pharyngeal constriction. Reduced constriction results in diffuse pharyngeal residue with thin liquids and residue in the vallecula with all solids. Solid residue spontaneously clears with additional reflexive swallows. Residue of thin liquids appeared to trickle in to the laryngeal vestibule during chewing of regular solids, though residue never touched the vocal folds and was eventually ejected with spontaneous swallow/throat clear. During esophageal screening, observed esophageal stasis/retention in the cervical esophagus and above the lower esophagel sphincter with all solids. Mid-esophagus appeared tortuous in shape as bolus material travelled. Confounding esophageal impact on oropharyngeal swallow, patient exhibits tissue changes in the lower pharynx from thryoid surgery and placement of subsequent hardware, resulting in minimally reduced epiglottic inversion, though no penetration/aspiration observed during the swallow."  Today, pt seen for bedside swallow assessment in the setting of concern for aspiration PNA. Pt seen with trials of thin liquids (via cup and straw) and solids. Mild impulsivity noted with thin liquids, resulting in pt independently taking consecutive large sips of thin liquids via straw. Delayed cough noted after completion of consecutive sips, though similar congested cough noted in the absence of PO intake. No further cough noted with cued small sips. No change to vocal quality across trials. Vitals stable for duration of trials (ranging from 97-98 t/o session). Oral phase grossly intact- with complete manipulation and clearance of puree solid from oral cavity. Dentures not present for assessment, and pt deferred  completion of regular solids. Based on age, hx of oropharyngeal/esophageal dysphagia, low endurance, and current debility, pt is at increased risk of aspiration and HIGH risk for REFLUX aspiration; therefore, recommend STRICT aspiration precautions (slow rate, small bites, elevated HOB, and alert for PO intake). Additionally recommend upright positioning during and at least 30 min following PO intake. Recommend Dys 1 (puree) and thin liquids. SLP will monitor for continued safety with current diet. MD and RN aware of recommendations. SLP spoke to spouse Marcial Pacas) regarding results of assessment per nursing/family request. Spouse reported understanding regarding risk of aspiration and current diet recommendations.   SLP Visit Diagnosis: Dysphagia, oropharyngeal phase (R13.12);Dysphagia, pharyngoesophageal phase (R13.14)    Aspiration Risk  Moderate aspiration risk    Diet Recommendation   Thin;Dysphagia 1 (puree)  Medication Administration: Crushed with puree    Other  Recommendations Recommended Consults: Consider GI evaluation;Consider esophageal assessment Oral Care Recommendations: Oral care BID    Recommendations for follow up therapy are one component of a multi-disciplinary discharge planning process, led by the attending physician.  Recommendations may be updated based on patient status, additional functional criteria and insurance authorization.  Follow up Recommendations Follow physician's recommendations for discharge plan and follow up therapies      Assistance Recommended at Discharge    Functional Status Assessment Patient has had a recent decline in their functional status and demonstrates the ability to make significant improvements in function in a reasonable and predictable amount of time.  Frequency and Duration min 2x/week  2 weeks       Prognosis Prognosis for improved oropharyngeal function: Fair Barriers to Reach Goals: Severity of deficits;Other (Comment) (baseline  deficits)  Swallow Study   General Date of Onset: 08/01/23 HPI: [name] is a 68 y.o. female with past medical history of hypothyroidism, IIDM, presented to hospital for elective cervical spine surgery.  Patient underwent C3-6 laminoplasty and C2 dome and laminectomy surgery (today is POD 4). CT Chest: Left lower lobe pneumonia without sizable effusion. Pt currently NPO Type of Study: Bedside Swallow Evaluation Previous Swallow Assessment: MBSS completed 04/29/23 Diet Prior to this Study: NPO Temperature Spikes Noted: No (98.2 (WBC 6.0)) Respiratory Status: Nasal cannula History of Recent Intubation:  (for procedure only) Behavior/Cognition: Alert;Cooperative Oral Cavity Assessment: Dry Oral Care Completed by SLP: Yes Oral Cavity - Dentition: Edentulous (dentures not present for assessment) Vision: Functional for self-feeding Self-Feeding Abilities: Able to feed self;Needs set up Patient Positioning: Upright in bed Baseline Vocal Quality: Normal Volitional Cough: Congested Volitional Swallow: Able to elicit    Oral/Motor/Sensory Function Overall Oral Motor/Sensory Function: Within functional limits   Ice Chips Ice chips: Within functional limits   Thin Liquid Thin Liquid: Impaired Presentation: Straw;Cup Oral Phase Impairments:  (none) Oral Phase Functional Implications:  (none) Pharyngeal  Phase Impairments: Cough - Delayed (with consecutive sips)    Nectar Thick Nectar Thick Liquid: Not tested   Honey Thick Honey Thick Liquid: Not tested   Puree Puree: Within functional limits   Solid     Solid: Not tested Other Comments: deferred secondary to lack of dentition     [place] Palak Tercero Clapp  MS [facility] SLP   [place] J Clapp 08/01/2023,11:40 AM

## 2023-08-01 NOTE — Anesthesia Procedure Notes (Signed)
Arterial Line Insertion Start/End1/25/2025 10:10 PM, 08/01/2023 10:13 PM Performed by: Stephanie Coup, MD, anesthesiologist  Patient location: OR. Preanesthetic checklist: patient identified, IV checked, site marked, risks and benefits discussed, surgical consent, monitors and equipment checked, pre-op evaluation, timeout performed and anesthesia consent Lidocaine 1% used for infiltration Right, radial was placed Catheter size: 20 G Hand hygiene performed  and maximum sterile barriers used   Attempts: 1 Procedure performed using ultrasound guided technique. Following insertion, dressing applied. Post procedure assessment: normal and unchanged  Patient tolerated the procedure well with no immediate complications.

## 2023-08-01 NOTE — Progress Notes (Signed)
Chaplain responds to stat visit request for pt who is about to have stat surgery. Pt is accompanied by husband Jorja Loa and son Robby. She expresses fear of paralysis or death, and chaplain offers comfort, support, and prayer. Chaplain will request follow-up visit from tomorrow's chaplain.

## 2023-08-01 NOTE — Progress Notes (Signed)
PT Cancellation Note  Patient Details Name: [name] MRN: [id number] DOB: 1955-09-21   Cancelled Treatment:     PT attempt. Pt currently working with SLP. Will return at a later time/date and continue to follow per current POC.    Rushie Chestnut 08/01/2023, 10:34 AM

## 2023-08-01 NOTE — Progress Notes (Signed)
[name] remains with lower extremity weakness and loss of sensation.  Her left arm is also weak.  CT scan did not show any obvious abnormalities but MRI does show large epidural collection concerning for hematoma.  We will proceed to the OR for evacuation.

## 2023-08-01 NOTE — Plan of Care (Signed)
Patient more awake and alert than the previous night shift. Remains alert and oriented. All needs attended to. Patient without complaints or distress. Will continue to monitor.   Problem: Education: Goal: Ability to describe self-care measures that may prevent or decrease complications (Diabetes Survival Skills Education) will improve Outcome: Progressing Goal: Individualized Educational Video(s) Outcome: Progressing   Problem: Coping: Goal: Ability to adjust to condition or change in health will improve Outcome: Progressing   Problem: Fluid Volume: Goal: Ability to maintain a balanced intake and output will improve Outcome: Progressing   Problem: Health Behavior/Discharge Planning: Goal: Ability to identify and utilize available resources and services will improve Outcome: Progressing Goal: Ability to manage health-related needs will improve Outcome: Progressing   Problem: Metabolic: Goal: Ability to maintain appropriate glucose levels will improve Outcome: Progressing   Problem: Nutritional: Goal: Maintenance of adequate nutrition will improve Outcome: Progressing Goal: Progress toward achieving an optimal weight will improve Outcome: Progressing   Problem: Skin Integrity: Goal: Risk for impaired skin integrity will decrease Outcome: Progressing   Problem: Tissue Perfusion: Goal: Adequacy of tissue perfusion will improve Outcome: Progressing   Problem: Education: Goal: Knowledge of General Education information will improve Description: Including pain rating scale, medication(s)/side effects and non-pharmacologic comfort measures Outcome: Progressing   Problem: Health Behavior/Discharge Planning: Goal: Ability to manage health-related needs will improve Outcome: Progressing   Problem: Clinical Measurements: Goal: Ability to maintain clinical measurements within normal limits will improve Outcome: Progressing Goal: Will remain free from infection Outcome:  Progressing Goal: Diagnostic test results will improve Outcome: Progressing Goal: Respiratory complications will improve Outcome: Progressing Goal: Cardiovascular complication will be avoided Outcome: Progressing   Problem: Activity: Goal: Risk for activity intolerance will decrease Outcome: Progressing   Problem: Nutrition: Goal: Adequate nutrition will be maintained Outcome: Progressing   Problem: Coping: Goal: Level of anxiety will decrease Outcome: Progressing   Problem: Elimination: Goal: Will not experience complications related to bowel motility Outcome: Progressing Goal: Will not experience complications related to urinary retention Outcome: Progressing   Problem: Pain Managment: Goal: General experience of comfort will improve and/or be controlled Outcome: Progressing   Problem: Safety: Goal: Ability to remain free from injury will improve Outcome: Progressing   Problem: Skin Integrity: Goal: Risk for impaired skin integrity will decrease Outcome: Progressing   Problem: Education: Goal: Ability to verbalize activity precautions or restrictions will improve Outcome: Progressing Goal: Knowledge of the prescribed therapeutic regimen will improve Outcome: Progressing Goal: Understanding of discharge needs will improve Outcome: Progressing   Problem: Activity: Goal: Ability to avoid complications of mobility impairment will improve Outcome: Progressing Goal: Ability to tolerate increased activity will improve Outcome: Progressing Goal: Will remain free from falls Outcome: Progressing   Problem: Bowel/Gastric: Goal: Gastrointestinal status for postoperative course will improve Outcome: Progressing   Problem: Clinical Measurements: Goal: Ability to maintain clinical measurements within normal limits will improve Outcome: Progressing Goal: Postoperative complications will be avoided or minimized Outcome: Progressing Goal: Diagnostic test results will  improve Outcome: Progressing   Problem: Pain Management: Goal: Pain level will decrease Outcome: Progressing   Problem: Skin Integrity: Goal: Will show signs of wound healing Outcome: Progressing   Problem: Health Behavior/Discharge Planning: Goal: Identification of resources available to assist in meeting health care needs will improve Outcome: Progressing   Problem: Bladder/Genitourinary: Goal: Urinary functional status for postoperative course will improve Outcome: Progressing

## 2023-08-01 NOTE — Progress Notes (Signed)
Occupational Therapy Treatment Patient Details Name: [name] MRN: [id number] DOB: 15-Mar-1956 Today's Date: 08/01/2023   History of present illness [name] is a 67yoF who presents 1/21 for C3-6 cervical laminoplasty (posterior approach) and C2 inferior dome laminectomy s/p cervical spine central cord injury. Pt recently (Nov 2024) at [facility] in [place], therapy notes describe significant recurrent orthostatic hypotension without consistent symptomology, but notable functional limitations. Pt sustained a fall in October 2024 where she injured cervical spine, also sustained Rt pubic rami fractures, was also orthostatic while at [facility]. PMH: blood dyscrasia, DM2, diverticulosis, hypoTSH, neuropathy, renal insufficiency.   OT comments  Upon entering room this afternoon patient in bed.  Husband present.  Report patient just had muscle relaxer.  Patient very lethargic unable to follow one-step commands consistently.  Patient report increased stinging and burning in the left upper extremity.  And inability to move left upper extremity.  OT done passive range of motion and active assisted range of motion in left upper extremity-patient unable or show increased weakness in supination pronation as well as radial ulnar deviation.  Unable to make a fist or extend digits including thumb.  Care team notified.  Patient had a stat CT and will lhave MRI.  Patient can benefit from skilled OT services to increase motion and strength in bilateral upper extremities as well as increase sitting and standing balance and functional mobility in ADLs as well as independence in ADLs to return to prior level of function.      If plan is discharge home, recommend the following:  A lot of help with walking and/or transfers;A lot of help with bathing/dressing/bathroom;Help with stairs or ramp for entrance   Equipment Recommendations  Other (comment)    Recommendations for Other Services      Precautions / Restrictions  Precautions Precautions: Cervical;Fall Precaution Booklet Issued: Yes (comment) Restrictions Weight Bearing Restrictions Per Provider Order: No       Mobility Bed Mobility                    Transfers                         Balance                                                                                                                           Cognition Arousal: Lethargic                                              Exercises Other Exercises Other Exercises: Active assisted range of motion done for left shoulder to 90 degrees.  Patient unable to assist.  10 reps Other Exercises: Left elbow flexion extension patient able to assist-4-4 elbow flexion 3+ for extension 10 reps Other Exercises: Wrist flexion and extension 4 -/5 assist  OT 10 reps; unable to assess in supination pronation as well as radial ulnar deviation Other Exercises: Patient unable to assist or make a full fist and extension.  0/5 strength including thumb. Other Exercises: Did observe patient using right hand to left left hand and wrist    Shoulder Instructions       General Comments      Pertinent Vitals/ Pain       Pain Assessment Pain Assessment:  (Too lethargic unable/just talk about burning and stinging pain in left arm)  Home Living                                          Prior Functioning/Environment              Frequency  Min 1X/week        Progress Toward Goals  OT Goals(current goals can now be found in the care plan section)  Progress towards OT goals: Progressing toward goals (Patient very lethargic and decreased ability to participate)  Acute Rehab OT Goals Patient Stated Goal: To go home OT Goal Formulation: With patient Time For Goal Achievement: 08/12/23 Potential to Achieve Goals: Good  Plan      Co-evaluation                 AM-PAC OT  "6 Clicks" Daily Activity     Outcome Measure                    End of Session    OT Visit Diagnosis: Unsteadiness on feet (R26.81);Muscle weakness (generalized) (M62.81)   Activity Tolerance Patient limited by lethargy   Patient Left in bed;with call bell/phone within reach;with bed alarm set;with family/visitor present   Nurse Communication          Time: 1330-1401 OT Time Calculation (min): 31 min  Charges: OT General Charges $OT Visit: 1 Visit OT Treatments $Therapeutic Exercise: 23-37 mins    Sedric Guia OTR/L,CLT 08/01/2023, 3:58 PM

## 2023-08-01 NOTE — Progress Notes (Addendum)
Neurosurgery Progress Note  History: [name] is here for C2 dome laminectomy, C3-6 laminoplasty   Interval POD4: [name] is more awake today. She states she isnt having a lot of pain but she is thirsty.    Hospital Course POD3:Patient more alert this AM. However; hypoxia, tachycardia, SOB, low energy.  POD2: Very lethargic and difficult to arouse, states in quite a bit of pain. Medicine had been held due to sleepiness.    Physical Exam: Vitals:   08/01/23 0428 08/01/23 0753  BP: (!) 114/57   Pulse: 98   Resp: 18   Temp: (!) 97.4 F (36.3 C)   SpO2: 97% 96%     Awake today and conversant Strength appears to be at least 4 throughout upper and lower extremities with right hip having some pain /5   Data:  Output by Drain (mL) 07/30/23 0701 - 07/30/23 1900 07/30/23 1901 - 07/31/23 0700 07/31/23 0701 - 07/31/23 1900 07/31/23 1901 - 08/01/23 0700 08/01/23 0701 - 08/01/23 0939  Requested LDAs do not have output data documented.     Assessment/Plan:  [name] POD4 s/p C2 dome laminectomy, C3-6 laminoplasty   - mobilize - pain control continue - DVT prophylaxis - PT/OT - Needs speech evaluation today - HV drain pulled 1/24. Patient tolerated well. - NPO per speech  #Pneumonia - diagnosed 1/24 via CT, started on broad spectrum antibiotics. Hospitalist consulted    Lucy Chris, MD Department of Neurosurgery

## 2023-08-01 NOTE — Progress Notes (Signed)
Notified MD of positive MRSA results.

## 2023-08-01 NOTE — Progress Notes (Signed)
1800 abx will be given late, patient off floor for MRI.

## 2023-08-01 NOTE — Anesthesia Procedure Notes (Signed)
Procedure Name: Intubation Date/Time: 08/01/2023 10:09 PM  Performed by: Leonides Cave, CRNAPre-anesthesia Checklist: Patient identified, Patient being monitored, Timeout performed, Emergency Drugs available and Suction available Patient Re-evaluated:Patient Re-evaluated prior to induction Oxygen Delivery Method: Circle system utilized Preoxygenation: Pre-oxygenation with 100% oxygen Induction Type: IV induction Ventilation: Mask ventilation without difficulty Laryngoscope Size: 3 and McGrath Grade View: Grade I Tube type: Oral Tube size: 7.0 mm Number of attempts: 1 Airway Equipment and Method: Stylet Placement Confirmation: ETT inserted through vocal cords under direct vision, positive ETCO2 and breath sounds checked- equal and bilateral Secured at: 21 cm Tube secured with: Tape Dental Injury: Teeth and Oropharynx as per pre-operative assessment

## 2023-08-01 NOTE — Progress Notes (Signed)
[facility]  - [place] [facility]   PATIENT NAME: [name]    MR#:  [id number]  DATE OF BIRTH:  07/03/1956  SUBJECTIVE:  husband at bedside. Internal medicine was consulted for shortness of breath and pneumonia management. Patient is drowsy chest had muscle relaxing prior to my entering the room. Weakness noted by family on left upper extremity. Right assessment removing well. Patient not awake enough to do full exam. No respiratory distress noted. Followed by speech therapy today.    VITALS:  Blood pressure (!) 153/80, pulse (!) 108, temperature 97.6 F (36.4 C), temperature source Axillary, resp. rate 20, height 5\' 7"  (1.702 m), weight 58.9 kg, SpO2 96%.  PHYSICAL EXAMINATION:   GENERAL:  68 y.o.-year-old patient with no acute distress. Chronically ill LUNGS: decreased breath sounds bilaterally, no wheezing CARDIOVASCULAR: S1, S2 normal. No murmur   ABDOMEN: Soft, nontender, nondistended.  EXTREMITIES: trace edema NEUROLOGIC: patient drowsy from muscle relaxant   LABORATORY PANEL:  CBC Recent Labs  Lab 08/01/23 0423  WBC 6.0  HGB 8.1*  HCT 24.3*  PLT 173    Chemistries  Recent Labs  Lab 08/01/23 0423  NA 141  K 4.4  CL 108  CO2 20*  GLUCOSE 136*  BUN 47*  CREATININE 1.42*  CALCIUM 9.0   Cardiac Enzymes No results for input(s): "TROPONINI" in the last 168 hours. RADIOLOGY:  CT CHEST WO CONTRAST Result Date: 07/31/2023 CLINICAL DATA:  Evaluate left basilar infiltrate EXAM: CT CHEST WITHOUT CONTRAST TECHNIQUE: Multidetector CT imaging of the chest was performed following the standard protocol without IV contrast. RADIATION DOSE REDUCTION: This exam was performed according to the departmental dose-optimization program which includes automated exposure control, adjustment of the mA and/or kV according to patient size and/or use of iterative reconstruction technique. COMPARISON:  Chest x-ray from earlier in the same day FINDINGS:  Cardiovascular: Somewhat limited due to lack of IV contrast. Atherosclerotic calcifications of the aorta are seen. No aneurysmal dilatation of the aorta is noted. Heavy coronary calcifications are seen. No cardiac enlargement is noted. Mediastinum/Nodes: Thoracic inlet is within normal limits. No hilar or mediastinal adenopathy is noted. The esophagus as visualized is within normal limits. Lungs/Pleura: Right lung is well aerated without focal infiltrate or effusion. Left lung shows significant lower lobe consolidation without effusion consistent with pneumonia. Some patchy atelectatic changes are noted in the lingula inferiorly as well. Upper Abdomen: Left kidney is atrophic. Right kidney is mildly hypertrophied. The remainder of the upper abdomen appears within normal limits. Musculoskeletal: No acute rib abnormality is noted. Mild degenerative changes of the thoracic spine are seen. IMPRESSION: Left lower lobe pneumonia without sizable effusion. Aortic Atherosclerosis (ICD10-I70.0). Electronically Signed   By: Alcide Clever M.D.   On: 07/31/2023 19:33   DG Chest 2 View Result Date: 07/31/2023 CLINICAL DATA:  Hypoxemia EXAM: CHEST - 2 VIEW COMPARISON:  10/22/2021 FINDINGS: Left retrocardiac/basilar airspace consolidation with possible small effusion. Right lung clear. Heart size and mediastinal contours are within normal limits. Surgical clips thoracic inlet. Cervical fixation hardware partially visualized. IMPRESSION: Left basilar airspace disease with possible small effusion. Electronically Signed   By: Corlis Leak M.D.   On: 07/31/2023 15:20   Assessment and Plan [name] is a 68 y.o. female with past medical history of hypothyroidism, IIDM, presented to hospital for elective cervical spine surgery.  Patient underwent C3-6 laminoplasty and C2 dome and laminectomy surgery and today is POD 3.   Patient reported that feeling shortness of breath and  cough since yesterday and coughing up clear phlegm,  associated with soreness like chest pain on the left mid chest, denies any fever or chills.   Acute hypoxic respiratory failure left lower lobe pneumonia history of lower esophageal stricture status post dilation in October 2024 -Chest x-ray showed signs of atelectasis versus infiltrates on left lower lung.   --CT chest  Left lower lobe pneumonia without sizable effusion.  -Add Atrovent every 6 hours plus Xopenex as needed -Flutter valve -Guaifenesin and Tessalon --pt on Ceftazidime, Flagyl and Vancomycin-- will de-escalate antibiotics in a day or two --MRSA positive -- seen by speech therapist. Patient is at increased risk of aspiration. Recommend Strick aspiration precaution. Dysphagia. Thin liquid.   SIRS -Tachycardia, and hypoxic -Lactic acid wnl   Dm-2, with hyperglycemia - SSI   C3-6 laminectomy  -As per primary team -- patient has some neuro- findings with decreased movement of the left upper extremity and difficulty moving bilateral lower extremity.-- Per Dr. Adriana Simas Stat CT scan of the neck has been ordered.        TOTAL TIME TAKING CARE OF THIS PATIENT: 35 minutes.  >50% time spent on counselling and coordination of care  Note: This dictation was prepared with Dragon dictation along with smaller phrase technology. Any transcriptional errors that result from this process are unintentional.  Enedina Finner M.D    [facility]   CC: Primary care physician; Jerl Mina, MD

## 2023-08-01 NOTE — Anesthesia Preprocedure Evaluation (Signed)
Anesthesia Evaluation  Patient identified by MRN, date of birth, ID band Patient awake    Reviewed: Allergy & Precautions, NPO status , Patient's Chart, lab work & pertinent test results  History of Anesthesia Complications Negative for: history of anesthetic complications  Airway Mallampati: I  TM Distance: >3 FB Neck ROM: Full    Dental  (+) Edentulous Upper, Edentulous Lower   Pulmonary neg pulmonary ROS   Pulmonary exam normal breath sounds clear to auscultation       Cardiovascular negative cardio ROS Normal cardiovascular exam Rhythm:Regular Rate:Normal  ECG 04/13/23: NSR; RBBB  Echo 07/16/22: NORMAL STRESS ECHOCARDIOGRAM. NORMAL RESTING STUDY WITH NO WALL MOTION ABNORMALITIES AT REST AND PEAK EXERCISE.  Maximum workload of 5.7 METs was achieved during exercise.     Neuro/Psych  Neuromuscular disease (neuropathy) negative neurological ROS  negative psych ROS   GI/Hepatic negative GI ROS, Neg liver ROS,GERD  ,,  Endo/Other  negative endocrine ROSdiabetes, Type 2Hypothyroidism    Renal/GU      Musculoskeletal   Abdominal   Peds  Hematology negative hematology ROS (+) Blood dyscrasia, anemia   Anesthesia Other Findings Patient with PSH of cervical spine surgery on 07/28/2023. Patient has had worsening left arm and leg weakness. On exam, patient is unable to move both her left arm and leg. Patient states that she has also been nauseous and threw up a few minutes ago but family member at bedside states she has been with the patient for over an hour and the patient never mentioned anything about vomiting. Patient also was diagnosed with a pneumonia in her left lower lobe since her procedure. Patient consented for possible post op intubation, arterial line, and increased risk for complications under anesthesia, including stroke, heart attack and even death. Patient understands and agrees to proceed.   Past Medical  History: No date: Acute incomplete quadriplegia (HCC) No date: Acute respiratory failure with hypoxia (HCC) No date: AKI (acute kidney injury) (HCC) No date: Arthritis No date: Blood dyscrasia     Comment:  patient stated she is a "free bleeder" No date: Central cord syndrome (HCC) No date: Dental caries No date: Diabetes mellitus without complication (HCC) No date: Diverticulosis No date: Dysphagia No date: Esophageal stricture No date: Facial cellulitis No date: GERD (gastroesophageal reflux disease) No date: Hypothyroidism No date: Neuropathy No date: Odynophagia No date: Pelvic fracture (HCC) No date: Renal insufficiency No date: Spinal cord compression, post-traumatic (HCC) No date: Spinal cord injury, cervical region (HCC) No date: Stage 3a chronic kidney disease (CKD) (HCC)  Past Surgical History: No date: ABDOMINAL HYSTERECTOMY 03/16/2017: BILATERAL SALPINGECTOMY; Bilateral     Comment:  Procedure: BILATERAL SALPINGECTOMY;  Surgeon:               Schermerhorn, Ihor Austin, MD;  Location: [facility] ORS;                Service: Gynecology;  Laterality: Bilateral; 05/03/2023: BIOPSY     Comment:  Procedure: BIOPSY;  Surgeon: Benancio Deeds, MD;                Location: [facility] ENDOSCOPY;  Service: Gastroenterology;; 08/18/2018: COLONOSCOPY WITH PROPOFOL; N/A     Comment:  Procedure: COLONOSCOPY WITH PROPOFOL;  Surgeon: Scot Jun, MD;  Location: [facility] ENDOSCOPY;  Service:               Endoscopy;  Laterality: N/A; 03/16/2017: CYSTOCELE REPAIR; N/A  Comment:  Procedure: ANTERIOR REPAIR (CYSTOCELE);  Surgeon:               Schermerhorn, Ihor Austin, MD;  Location: [facility] ORS;                Service: Gynecology;  Laterality: N/A; 08/18/2018: ESOPHAGOGASTRODUODENOSCOPY (EGD) WITH PROPOFOL; N/A     Comment:  Procedure: ESOPHAGOGASTRODUODENOSCOPY (EGD) WITH               PROPOFOL;  Surgeon: Scot Jun, MD;  Location:               [facility] ENDOSCOPY;  Service:  Endoscopy;  Laterality: N/A; 05/01/2023: ESOPHAGOGASTRODUODENOSCOPY (EGD) WITH PROPOFOL; Left     Comment:  Procedure: ESOPHAGOGASTRODUODENOSCOPY (EGD) WITH               PROPOFOL;  Surgeon: Benancio Deeds, MD;  Location:               [facility] ENDOSCOPY;  Service: Gastroenterology;  Laterality:               Left; 05/03/2023: ESOPHAGOGASTRODUODENOSCOPY (EGD) WITH PROPOFOL; N/A     Comment:  Procedure: ESOPHAGOGASTRODUODENOSCOPY (EGD) WITH               PROPOFOL;  Surgeon: Benancio Deeds, MD;  Location:               [facility] ENDOSCOPY;  Service: Gastroenterology;  Laterality:               N/A; 07/28/2023: POSTERIOR CERVICAL LAMINECTOMY; N/A     Comment:  Procedure: C2 DOME LAMINECTOMY;  Surgeon: [name], MD;  Location: [facility] ORS;  Service: Neurosurgery;                Laterality: N/A; No date: TEAR DUCT PROBING; Right No date: TUBAL LIGATION 03/16/2017: VAGINAL HYSTERECTOMY; N/A     Comment:  Procedure: HYSTERECTOMY VAGINAL;  Surgeon: Suzy Bouchard, MD;  Location: [facility] ORS;  Service: Gynecology;               Laterality: N/A; No date: WISDOM TOOTH EXTRACTION  BMI    Body Mass Index: 20.34 kg/m      Reproductive/Obstetrics negative OB ROS                             Anesthesia Physical Anesthesia Plan  ASA: 4 and emergent  Anesthesia Plan: General ETT   Post-op Pain Management:    Induction: Intravenous  PONV Risk Score and Plan: 3 and Ondansetron and Dexamethasone  Airway Management Planned: Oral ETT  Additional Equipment: Arterial line  Intra-op Plan:   Post-operative Plan: Possible Post-op intubation/ventilation  Informed Consent: I have reviewed the patients History and Physical, chart, labs and discussed the procedure including the risks, benefits and alternatives for the proposed anesthesia with the patient or authorized representative who has indicated his/her understanding and acceptance.      Dental Advisory Given  Plan Discussed with: Anesthesiologist, CRNA and Surgeon  Anesthesia Plan Comments: (Patient consented for risks of anesthesia including but not limited to:  - adverse reactions to medications - damage to eyes, teeth, lips or other oral mucosa - nerve damage due to positioning  - sore throat or hoarseness - Damage to heart, brain, nerves, lungs, other  parts of body or loss of life  Patient voiced understanding and assent.)       Anesthesia Quick Evaluation

## 2023-08-02 ENCOUNTER — Inpatient Hospital Stay: Payer: Medicare Other

## 2023-08-02 ENCOUNTER — Encounter: Payer: Self-pay | Admitting: Neurosurgery

## 2023-08-02 DIAGNOSIS — J9601 Acute respiratory failure with hypoxia: Secondary | ICD-10-CM

## 2023-08-02 DIAGNOSIS — G959 Disease of spinal cord, unspecified: Secondary | ICD-10-CM | POA: Diagnosis not present

## 2023-08-02 DIAGNOSIS — D62 Acute posthemorrhagic anemia: Secondary | ICD-10-CM

## 2023-08-02 DIAGNOSIS — G9761 Postprocedural hematoma of a nervous system organ or structure following a nervous system procedure: Secondary | ICD-10-CM

## 2023-08-02 DIAGNOSIS — N179 Acute kidney failure, unspecified: Secondary | ICD-10-CM

## 2023-08-02 DIAGNOSIS — J189 Pneumonia, unspecified organism: Secondary | ICD-10-CM | POA: Diagnosis not present

## 2023-08-02 LAB — COMPREHENSIVE METABOLIC PANEL
ALT: 13 U/L (ref 0–44)
AST: 26 U/L (ref 15–41)
Albumin: 2.7 g/dL — ABNORMAL LOW (ref 3.5–5.0)
Alkaline Phosphatase: 48 U/L (ref 38–126)
Anion gap: 15 (ref 5–15)
BUN: 44 mg/dL — ABNORMAL HIGH (ref 8–23)
CO2: 16 mmol/L — ABNORMAL LOW (ref 22–32)
Calcium: 8 mg/dL — ABNORMAL LOW (ref 8.9–10.3)
Chloride: 107 mmol/L (ref 98–111)
Creatinine, Ser: 1.08 mg/dL — ABNORMAL HIGH (ref 0.44–1.00)
GFR, Estimated: 57 mL/min — ABNORMAL LOW (ref >60)
Glucose, Bld: 193 mg/dL — ABNORMAL HIGH (ref 70–99)
Potassium: 5.1 mmol/L (ref 3.5–5.1)
Sodium: 138 mmol/L (ref 135–145)
Total Bilirubin: 1 mg/dL (ref 0.0–1.2)
Total Protein: 5.8 g/dL — ABNORMAL LOW (ref 6.5–8.1)

## 2023-08-02 LAB — GLUCOSE, CAPILLARY
Glucose-Capillary: 180 mg/dL — ABNORMAL HIGH (ref 70–99)
Glucose-Capillary: 201 mg/dL — ABNORMAL HIGH (ref 70–99)
Glucose-Capillary: 167 mg/dL — ABNORMAL HIGH (ref 70–99)
Glucose-Capillary: 150 mg/dL — ABNORMAL HIGH (ref 70–99)
Glucose-Capillary: 117 mg/dL — ABNORMAL HIGH (ref 70–99)
Glucose-Capillary: 109 mg/dL — ABNORMAL HIGH (ref 70–99)

## 2023-08-02 LAB — CBC
HCT: 26.4 % — ABNORMAL LOW (ref 36.0–46.0)
Hemoglobin: 8.7 g/dL — ABNORMAL LOW (ref 12.0–15.0)
MCH: 29.8 pg (ref 26.0–34.0)
MCHC: 33 g/dL (ref 30.0–36.0)
MCV: 90.4 fL (ref 80.0–100.0)
Platelets: 131 10*3/uL — ABNORMAL LOW (ref 150–400)
RBC: 2.92 MIL/uL — ABNORMAL LOW (ref 3.87–5.11)
RDW: 13.6 % (ref 11.5–15.5)
WBC: 5.2 10*3/uL (ref 4.0–10.5)
nRBC: 0 % (ref 0.0–0.2)

## 2023-08-02 LAB — BLOOD GAS, ARTERIAL
Acid-base deficit: 8.1 mmol/L — ABNORMAL HIGH (ref 0.0–2.0)
Bicarbonate: 17 mmol/L — ABNORMAL LOW (ref 20.0–28.0)
FIO2: 50 %
MECHVT: 450 mL
Mechanical Rate: 20
O2 Saturation: 99.6 %
PEEP: 5 cmH2O
Patient temperature: 37
pCO2 arterial: 33 mm[Hg] (ref 32–48)
pH, Arterial: 7.32 — ABNORMAL LOW (ref 7.35–7.45)
pO2, Arterial: 172 mm[Hg] — ABNORMAL HIGH (ref 83–108)

## 2023-08-02 LAB — LACTIC ACID, PLASMA: Lactic Acid, Venous: 1.8 mmol/L (ref 0.5–1.9)

## 2023-08-02 LAB — PREPARE RBC (CROSSMATCH)

## 2023-08-02 LAB — T4, FREE: Free T4: 1.01 ng/dL (ref 0.61–1.12)

## 2023-08-02 LAB — TSH: TSH: 0.953 u[IU]/mL (ref 0.350–4.500)

## 2023-08-02 MED ORDER — 0.9 % SODIUM CHLORIDE (POUR BTL) OPTIME
TOPICAL | Status: DC | PRN
  Administered 2023-08-02 (×2): 1000 mL

## 2023-08-02 MED ORDER — CHLORHEXIDINE GLUCONATE CLOTH 2 % EX PADS
6.0000 | MEDICATED_PAD | Freq: Every day | CUTANEOUS | Status: DC
Start: 2023-08-02 — End: 2023-08-03
  Administered 2023-08-02: 6 via TOPICAL

## 2023-08-02 MED ORDER — LINEZOLID 600 MG/300ML IV SOLN: 600.0000 mg | Freq: Two times a day (BID) | INTRAVENOUS | Status: DC

## 2023-08-02 MED ORDER — FENTANYL CITRATE (PF) 100 MCG/2ML IJ SOLN
INTRAMUSCULAR | Status: AC
  Filled 2023-08-02: qty 2

## 2023-08-02 MED ORDER — PROPOFOL 1000 MG/100ML IV EMUL
0.0000 ug/kg/min | INTRAVENOUS | Status: DC
  Administered 2023-08-02: 40 ug/kg/min via INTRAVENOUS
  Administered 2023-08-02: 50 ug/kg/min via INTRAVENOUS
  Filled 2023-08-02 (×3): qty 100

## 2023-08-02 MED ORDER — DOCUSATE SODIUM 50 MG/5ML PO LIQD
100.0000 mg | Freq: Two times a day (BID) | ORAL | Status: DC
  Filled 2023-08-02: qty 10

## 2023-08-02 MED ORDER — ACETAMINOPHEN 10 MG/ML IV SOLN
1000.0000 mg | Freq: Four times a day (QID) | INTRAVENOUS | Status: DC
  Administered 2023-08-02 – 2023-08-03 (×3): 1000 mg via INTRAVENOUS
  Filled 2023-08-02 (×4): qty 100

## 2023-08-02 MED ORDER — PROPOFOL 500 MG/50ML IV EMUL
INTRAVENOUS | Status: DC | PRN
  Administered 2023-08-02: 25 ug/kg/min via INTRAVENOUS

## 2023-08-02 MED ORDER — HEMOSTATIC AGENTS (NO CHARGE) OPTIME: TOPICAL | Status: DC | PRN

## 2023-08-02 MED ORDER — INSULIN ASPART 100 UNIT/ML IJ SOLN
0.0000 [IU] | INTRAMUSCULAR | Status: DC
  Administered 2023-08-02: 1 [IU] via SUBCUTANEOUS
  Administered 2023-08-02: 2 [IU] via SUBCUTANEOUS
  Administered 2023-08-02: 3 [IU] via SUBCUTANEOUS
  Administered 2023-08-03: 2 [IU] via SUBCUTANEOUS
  Administered 2023-08-03 (×2): 1 [IU] via SUBCUTANEOUS
  Administered 2023-08-04: 2 [IU] via SUBCUTANEOUS
  Administered 2023-08-04 (×4): 1 [IU] via SUBCUTANEOUS
  Administered 2023-08-05 (×5): 2 [IU] via SUBCUTANEOUS
  Administered 2023-08-06 (×2): 3 [IU] via SUBCUTANEOUS
  Filled 2023-08-02 (×18): qty 1

## 2023-08-02 MED ORDER — DOCUSATE SODIUM 50 MG/5ML PO LIQD
100.0000 mg | Freq: Two times a day (BID) | ORAL | Status: DC
  Administered 2023-08-07: 100 mg
  Filled 2023-08-02 (×2): qty 10

## 2023-08-02 MED ORDER — PHENYLEPHRINE HCL-NACL 20-0.9 MG/250ML-% IV SOLN
0.0000 ug/min | INTRAVENOUS | Status: DC
  Administered 2023-08-02: 70 ug/min via INTRAVENOUS
  Administered 2023-08-02: 20 ug/min via INTRAVENOUS
  Administered 2023-08-02 – 2023-08-03 (×2): 50 ug/min via INTRAVENOUS
  Administered 2023-08-03: 8 ug/min via INTRAVENOUS
  Administered 2023-08-03: 50 ug/min via INTRAVENOUS
  Administered 2023-08-04: 25 ug/min via INTRAVENOUS
  Administered 2023-08-04: 35 ug/min via INTRAVENOUS
  Administered 2023-08-05: 25 ug/min via INTRAVENOUS
  Administered 2023-08-06: 30 ug/min via INTRAVENOUS
  Administered 2023-08-06: 15 ug/min via INTRAVENOUS
  Administered 2023-08-06: 35 ug/min via INTRAVENOUS
  Administered 2023-08-07: 55 ug/min via INTRAVENOUS
  Administered 2023-08-07: 80 ug/min via INTRAVENOUS
  Administered 2023-08-08: 30 ug/min via INTRAVENOUS
  Administered 2023-08-08: 50 ug/min via INTRAVENOUS
  Administered 2023-08-08: 65 ug/min via INTRAVENOUS
  Administered 2023-08-09 (×3): 85 ug/min via INTRAVENOUS
  Administered 2023-08-09: 65 ug/min via INTRAVENOUS
  Administered 2023-08-10 (×3): 85 ug/min via INTRAVENOUS
  Filled 2023-08-02 (×25): qty 250

## 2023-08-02 MED ORDER — ALBUMIN HUMAN 5 % IV SOLN: INTRAVENOUS | Status: DC | PRN

## 2023-08-02 MED ORDER — PROPOFOL 10 MG/ML IV BOLUS
INTRAVENOUS | Status: AC
  Filled 2023-08-02: qty 20

## 2023-08-02 MED ORDER — PROPOFOL 1000 MG/100ML IV EMUL
INTRAVENOUS | Status: AC
  Filled 2023-08-02: qty 100

## 2023-08-02 MED ORDER — ONDANSETRON HCL 4 MG/2ML IJ SOLN
INTRAMUSCULAR | Status: DC | PRN
  Administered 2023-08-01: 4 mg via INTRAVENOUS

## 2023-08-02 MED ORDER — FENTANYL 2500MCG IN NS 250ML (10MCG/ML) PREMIX INFUSION
50.0000 ug/h | INTRAVENOUS | Status: DC
  Administered 2023-08-02: 50 ug/h via INTRAVENOUS
  Filled 2023-08-02 (×2): qty 250

## 2023-08-02 MED ORDER — BUPIVACAINE-EPINEPHRINE (PF) 0.5% -1:200000 IJ SOLN
INTRAMUSCULAR | Status: DC | PRN
  Administered 2023-08-01: 10 mL via PERINEURAL

## 2023-08-02 MED ORDER — BUDESONIDE 0.5 MG/2ML IN SUSP
0.5000 mg | Freq: Two times a day (BID) | RESPIRATORY_TRACT | Status: DC
Start: 2023-08-02 — End: 2023-08-03
  Administered 2023-08-02 – 2023-08-03 (×2): 0.5 mg via RESPIRATORY_TRACT
  Filled 2023-08-02 (×2): qty 2

## 2023-08-02 MED ORDER — SEVOFLURANE IN SOLN
RESPIRATORY_TRACT | Status: AC
  Filled 2023-08-02: qty 250

## 2023-08-02 MED ORDER — POLYETHYLENE GLYCOL 3350 17 G PO PACK
17.0000 g | PACK | Freq: Every day | ORAL | Status: DC
  Filled 2023-08-02: qty 1

## 2023-08-02 MED ORDER — FENTANYL BOLUS VIA INFUSION: 50.0000 ug | INTRAVENOUS | Status: DC | PRN

## 2023-08-02 MED ORDER — ALBUMIN HUMAN 5 % IV SOLN
INTRAVENOUS | Status: AC
  Filled 2023-08-02: qty 250

## 2023-08-02 MED ORDER — SURGIFLO WITH THROMBIN (HEMOSTATIC MATRIX KIT) OPTIME
TOPICAL | Status: DC | PRN
  Administered 2023-08-02: 2 via TOPICAL

## 2023-08-02 MED ORDER — VANCOMYCIN HCL IN DEXTROSE 1-5 GM/200ML-% IV SOLN
1000.0000 mg | INTRAVENOUS | Status: DC
  Administered 2023-08-02: 1000 mg via INTRAVENOUS
  Filled 2023-08-02 (×2): qty 200

## 2023-08-02 MED ORDER — ROCURONIUM BROMIDE 10 MG/ML (PF) SYRINGE
PREFILLED_SYRINGE | INTRAVENOUS | Status: AC
  Filled 2023-08-02: qty 10

## 2023-08-02 MED ORDER — NOREPINEPHRINE 4 MG/250ML-% IV SOLN
0.0000 ug/min | INTRAVENOUS | Status: DC
  Administered 2023-08-02: 2 ug/min via INTRAVENOUS
  Filled 2023-08-02: qty 250

## 2023-08-02 NOTE — Progress Notes (Signed)
Patient c/o inability to move left arm. Nurse entered room to assess patient, patient stated she could not move left arm but would reach up and scratch her face with left arm. Nurse notified MD and obtained vital signs. Vital signs WNL. Patient began a coughing episode and coughed up sputum and then vomited a small amount of water. MD notified of this as well and patient was given Zofran. Patient did reach out and grab handrail on the bed to pull herself up when vomiting. Patient began to complain of surgical pain. This nurse administered robaxin as nurse did not want to sedate patient with dilaudid before MD arrived to assess patient. Patient fell asleep shortly thereafter.

## 2023-08-02 NOTE — Progress Notes (Addendum)
Events of last evening noted. Decline in Neurological status with MRI cervical spine showing epidural hematoma with severe spinal stenosis requiring emergent surgery. Pt intubated at present. Pt will be followed by ICU service for now.  D/w Ky Barban  No charge

## 2023-08-02 NOTE — Anesthesia Postprocedure Evaluation (Signed)
Anesthesia Post Note  Patient: [name]  Procedure(s) Performed: POSTERIOR CERVICAL LAMINECTOMY  Patient location during evaluation: SICU Anesthesia Type: General Level of consciousness: sedated Pain management: pain level controlled Vital Signs Assessment: post-procedure vital signs reviewed and stable Respiratory status: patient remains intubated per anesthesia plan Cardiovascular status: stable Postop Assessment: no apparent nausea or vomiting Anesthetic complications: no   No notable events documented.   Last Vitals:  Vitals:   08/02/23 0734 08/02/23 0745  BP:    Pulse: 64 66  Resp: 20 20  Temp:    SpO2: 100% 100%    Last Pain:  Vitals:   08/02/23 0300  TempSrc: Axillary  PainSc:                  [name]

## 2023-08-02 NOTE — Progress Notes (Signed)
  Chaplain On-Call responded to follow-up referral from [facility], who ministered to the patient and family last night regarding stat spinal surgery for the patient.  Chaplain met the patient, her husband Marcial Pacas Dietitian) and son Molly Maduro at bedside. The patient was awakening after sedation and extubation, and was unable to speak at this time.  Scott and Molly Maduro described the urgent nature of last night's surgery, and their anxiety continuing today for the patient's recovery.  Chaplain provided spiritual and emotional support and prayer, and assured them of further availability as needed.  Chaplain Morene Crocker., [facility]

## 2023-08-02 NOTE — Progress Notes (Signed)
Extubation order written . Cuff leak noted. Patient extubated to [place] @2L . Will continue to monitor.

## 2023-08-02 NOTE — Progress Notes (Signed)
SLP Cancellation Note  Patient Details Name: [name] MRN: [id number] DOB: [birth year]   Cancelled treatment:       Reason Eval/Treat Not Completed: Medical issues which prohibited therapy  Pt continues to be intubated. Will follow for appropriateness.   Lanell Dubie B. Dreama Saa, M.S., CCC-SLP, CBIS Speech-Language Pathologist Certified Brain Injury Specialist [facility]  [facility] [phone] Ascom [phone] Fax [phone]   Reuel Derby 08/02/2023, 9:17 AM

## 2023-08-02 NOTE — Progress Notes (Signed)
OT Cancellation Note  Patient Details Name: [name] MRN: [id number] DOB: [birth year]   Cancelled Treatment:    Reason Eval/Treat Not Completed: Patient not medically ready;Other (comment). Patient transferred to the ICU on 08/01/23 s/p emergent cervical decompression and evacuation of epidural hematoma, remains intubated. Please re-order when appropriate.   Arman Filter., MPH, MS, OTR/L ascom [phone] 08/02/23, 7:58 AM

## 2023-08-02 NOTE — Progress Notes (Signed)
Dr. Belia Heman gave order to insert small bore dobbhoff feeding tube.

## 2023-08-02 NOTE — Transfer of Care (Signed)
Immediate Anesthesia Transfer of Care Note  Patient: [name]  Procedure(s) Performed: POSTERIOR CERVICAL LAMINECTOMY  Patient Location: ICU  Anesthesia Type:General  Level of Consciousness: sedated and Patient remains intubated per anesthesia plan  Airway & Oxygen Therapy: Patient remains intubated per anesthesia plan  Post-op Assessment: Report given to RN and on ventilator and sedated  Post vital signs: Reviewed and stable  Last Vitals:  Vitals Value Taken Time  BP 175/81 08/02/23 0251  Temp    Pulse 70 08/02/23 0255  Resp 20 08/02/23 0256  SpO2 100 % 08/02/23 0255  Vitals shown include unfiled device data.  Last Pain:  Vitals:   08/01/23 1548  TempSrc: Oral  PainSc:       Patients Stated Pain Goal: 0 (07/29/23 1955)  Complications: No notable events documented.

## 2023-08-02 NOTE — Progress Notes (Signed)
Pharmacy Antibiotic Note  [name] is a 68 y.o. female admitted on 07/28/2023 with pneumonia. They presented with shortness of breath, coughing up clear phlegm, and soreness like chest pain on the mid chest. They are currently NPO due to high risk of aspiration. Pharmacy has been consulted for Vancomycin and Cefepime dosing.  Today, 08/02/2023 Scr 1.29-->1.08 (baseline) WBC 6.7 Fever 100.1 F-->now febrile Chest review by MD: likely lower lobe infiltrates and consolidation   Plan: Day 3 of antibiotics MRSA PCR(+) Followed by Vancomycin 1000 mg IV Q24H. Goal AUC 400-550. Expected AUC: 530.1 Expected Css min: 12.9 SCr used: 1.08 Weight used: TBW, Vd used: 0.72 (BMI 20.34) Patient is also on Cefepime 2 g IV Q12H and Flagyl 500mg  Q12 hours Continue to monitor renal function and follow culture results  Height: 5\' 7"  (170.2 cm) Weight: 58.9 kg (129 lb 13.6 oz) IBW/kg (Calculated) : 61.6  Temp (24hrs), Avg:97.8 F (36.6 C), Min:97.6 F (36.4 C), Max:98 F (36.7 C)  Recent Labs  Lab 07/28/23 1534 07/30/23 2136 07/31/23 1457 07/31/23 1731 08/01/23 0423 08/01/23 2005 08/02/23 0315  WBC 9.5 8.4 6.7  --  6.0 5.8 5.2  CREATININE 1.21* 1.29*  --   --  1.42*  --  1.08*  LATICACIDVEN  --   --  1.2 1.8  --   --  1.8    Estimated Creatinine Clearance: 48.3 mL/min (A) (by C-G formula based on SCr of 1.08 mg/dL (H)).    Allergies  Allergen Reactions   Amoxicillin-Pot Clavulanate Itching and Anaphylaxis   Ampicillin Rash    Antimicrobials this admission: 1/24 Vancomycin >>  1/24 Cefepime >>  125 Flagyl >>  Dose adjustments this admission: NA  Microbiology results: 1/24 MRSA PCR: POSITIVE 1/24 Expectorated Sputum: needs to be collected  1/24 Strep pneumoniae: needs to be collected   Thank you for allowing pharmacy to be a part of this patient's care.  Bettey Costa, PharmD Clinical Pharmacist 08/02/2023 10:50 AM

## 2023-08-02 NOTE — Progress Notes (Signed)
MD requests nurse assess patient's lower extremity movement as patient had complained of not being able to move legs. Patient stated she could not move legs when nurse asked but did withdraw feet from nurse stimulation. MD notified of findings. Patient is now moving left arm as well.

## 2023-08-02 NOTE — Progress Notes (Signed)
Notified Dr. Belia Heman that dobbhoff is coiling in esophagus. Also attempted to insert in left nare and would not pass. MD acknowledged and gave order to starting weaning sedation.

## 2023-08-02 NOTE — Progress Notes (Signed)
PT Cancellation Note and Discharge   Patient Details Name: [name] MRN: [id number] DOB: Aug 13, 1955   Cancelled Treatment:    Reason Eval/Treat Not Completed: Medical issues which prohibited therapy (Patient transferred to the ICU s/p emergent cervical decompression and evacuation of epidural hematoma, remains intubated. Please re-order when appropriate.)   Donna Bernard, PT, MPT  Ina Homes 08/02/2023, 7:54 AM

## 2023-08-02 NOTE — Consult Note (Addendum)
NAME:  [name], MRN:  [id number], DOB:  29-May-1956, LOS: 5 ADMISSION DATE:  07/28/2023, CONSULTATION DATE:  08/02/23 REFERRING MD:  Ernestine Mcmurray CHIEF COMPLAINT:  cervical myelopathy  HPI  [name] is a 68 year old old female with PMHx of hypothyroidism, insulin-dependent DM, CKD stage III, dysphagia with esophageal strictures, facial cellulitis, GERD, neuropathy, arthritis, blood dyscrasia, and recent traumatic cervical central cord injury who presented to the hospital for elective cervical spine surgery.  Per patient's chart, patient was seen by neurosurgery on 07/28/2023 for follow-up after suffering a fall 3 months ago in which she sustained a cervical spinal cord injury.  Per patient, they deferred emergent surgical depression and wanted to let her have time to recover and work with physical therapy.  She continued to have hand weakness as well as lower extremity weakness and ambulatory issues.  Given the ongoing compression of C3-C6, laminoplasty and C2 dome laminectomy was indicated.    Hospital course: Patient underwent C3-6 laminoplasty and C2 dome and laminectomy surgery on 07/29/2023 with drain placement.   Pertinent Labs/Diagnostics Findings: Na+/ K+: 141/4.4 Glucose: 136 BUN/Cr.47/1.42 WBC: 5.8 K/L  Hgb/Hct: 7.4/22.4  Past Medical History  Hypothyroidism, insulin-dependent DM, CKD stage III, dysphagia with esophageal strictures, facial cellulitis, GERD, neuropathy, arthritis, blood dyscrasia, and recent traumatic cervical central cord injury  Significant Hospital Events   01/21: Patient underwent posterior C3-6 Cervical Laminoplasty with C2 inferior dome Laminectomy 01/22: POD #1. Pt. stable with no acute changes.  Working with PT and OT 01/23: POD #2.  Pt. noted to be very lethargic and difficult to arouse 01/24: POD #3. Pt. developed hypoxia tachycardia and shortness of breath.  Hospitalist consulted CT chest obtained and showed possible pneumonia.  Started on IV  antibiotics and DuoNebs. 01/25: POD #4. Pt. with new concerns of not moving the left arm.  CT head and cervical spine obtained with no new concerns.  Follow-up MRI however demonstrated epidural hematoma extending from the cervical spine to the lower thoracic spine with acute compression and T2 signal.  Taken emergently to the OR for surgical decompression 01/26: Pt. transferred to the ICU s/p emergent cervical decompression and evacuation of epidural hematoma.  Remained intubated. PCCM consulted  Consults:  PCCM Neurosurgery Hospitalist  Procedures:  1/21:posterior C3-6 Cervical Laminoplasty with C2 inferior dome Laminectomy 1/25: s/p Epidural Hematoma Decompression 1/25: Intubation 1/25: Arterial Line  Significant Diagnostic Tests:  1/25:MRI  Thoracic and Cervical Spine> IMPRESSION: 1. Postoperative changes from recent C3 through C6 laminectomy with laminoplasty. Associated fractures better appreciated on prior CT. 2. Dorsal epidural collection extending from C2-3 through T9, likely epidural hematoma. Resultant severe diffuse spinal stenoses stenosis with cord compression at these levels. 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 Emergent neuro surgical 3. Underlying multilevel cervical spondylosis with resultant multilevel foraminal narrowing as above. Notable findings include severe left C4 foraminal stenosis, moderate bilateral C5 foraminal narrowing, with mild bilateral C6 and right C7 foraminal stenosis.  1/25: Noncontrast CT Head &Cervical Spine> IMPRESSION: 1. No acute intracranial abnormality. 2. Mild cortical atrophy, within normal limits for patient age. 3. Improved paranasal sinus mucosal opacification as described above.  1/24: CTA Chest> IMPRESSION: Left lower lobe pneumonia without sizable effusion.  Interim History / Subjective:    -  Micro Data:  1/24: Blood culture  x2> 1/24: MRSA PCR>> positive 1/24: Strep pneumo urinary antigen>  Antimicrobials:  1/24 Vancomycin >>  1/24 Cefepime >>  OBJECTIVE  Blood pressure  126/68, pulse 92, temperature 98 F (36.7 C), temperature source Oral, resp. rate 15, height 5\' 7"  (1.702 m), weight 58.9 kg, SpO2 95%.      Intake/Output Summary (Last 24 hours) at 08/02/2023 0218 Last data filed at 08/02/2023 0150 Gross per 24 hour  Intake 2981 ml  Output 2050 ml  Net 931 ml   Filed Weights   07/28/23 0615  Weight: 58.9 kg   Physical Examination  GENERAL: lying down in bed, no apparent distress - sedated - no spontaneous movements CVS: RRR, no carotid bruit. LUNGS: on mechanical ventillation, clear breath sounds ABDOMEN: Soft, NTTP EXTREMITIES: no edema or cyanosis NEURO: MENTAL STATUS: sedated on propofol LANG/SPEECH: non-verbal (sedated) CRANIAL NERVES: Pupils are equal and reactive, face symmetric - poor cough and gag to suctioning, rest of cranial nerves were deferred due to sedation. MOTOR: no spontaneous movements - no withdrawal to pain on either side (sedated) REFLEXES: hyporeflexic bilaterally SENSORY: no reaction to pain in both sides GAIT: deferred - sedated SKIN: intact except as below:      Labs/imaging that I havepersonally reviewed  (right click and "Reselect all SmartList Selections" daily)     Labs   CBC: Recent Labs  Lab 07/28/23 1534 07/30/23 2136 07/31/23 1457 08/01/23 0423 08/01/23 2005  WBC 9.5 8.4 6.7 6.0 5.8  HGB 9.6* 8.6* 8.8* 8.1* 7.4*  HCT 28.7* 26.1* 26.8* 24.3* 22.4*  MCV 86.7 86.7 88.4 87.4 89.2  PLT 167 167 176 173 174    Basic Metabolic Panel: Recent Labs  Lab 07/28/23 1534 07/30/23 2136 08/01/23 0423  NA  --  140 141  K  --  4.8 4.4  CL  --  104 108  CO2  --  23 20*  GLUCOSE  --  86 136*  BUN  --  38* 47*  CREATININE 1.21* 1.29* 1.42*  CALCIUM  --  9.5 9.0   GFR: Estimated Creatinine Clearance: 36.7 mL/min (A) (by C-G formula based on SCr of  1.42 mg/dL (H)). Recent Labs  Lab 07/30/23 2136 07/31/23 1457 07/31/23 1731 08/01/23 0423 08/01/23 2005  WBC 8.4 6.7  --  6.0 5.8  LATICACIDVEN  --  1.2 1.8  --   --     Liver Function Tests: No results for input(s): "AST", "ALT", "ALKPHOS", "BILITOT", "PROT", "ALBUMIN" in the last 168 hours. No results for input(s): "LIPASE", "AMYLASE" in the last 168 hours. No results for input(s): "AMMONIA" in the last 168 hours.  ABG    Component Value Date/Time   PHART 7.38 04/16/2023 1022   PCO2ART 37 04/16/2023 1022   PO2ART 108 04/16/2023 1022   HCO3 21.9 04/16/2023 1022   ACIDBASEDEF 2.9 (H) 04/16/2023 1022   O2SAT 98.9 04/16/2023 1022     Coagulation Profile: Recent Labs  Lab 08/01/23 2005  INR 1.3*    Cardiac Enzymes: No results for input(s): "CKTOTAL", "CKMB", "CKMBINDEX", "TROPONINI" in the last 168 hours.  HbA1C: Hemoglobin A1C  Date/Time Value Ref Range Status  02/20/2013 04:02 AM 10.3 (H) 4.2 - 6.3 % Final    Comment:    The American Diabetes Association recommends that a primary goal of therapy should be <7% and that physicians should reevaluate the treatment regimen in patients with HbA1c values consistently >8%.    Hgb A1c MFr Bld  Date/Time Value Ref Range Status  04/13/2023 11:34 AM 5.9 (H) 4.8 - 5.6 % Final    Comment:    (NOTE) Pre diabetes:          5.7%-6.4%  Diabetes:              >6.4%  Glycemic control for   <7.0% adults with diabetes   08/11/2021 08:39 PM 7.2 (H) 4.8 - 5.6 % Final    Comment:    (NOTE) Pre diabetes:          5.7%-6.4%  Diabetes:              >6.4%  Glycemic control for   <7.0% adults with diabetes    CBG: Recent Labs  Lab 07/31/23 2114 08/01/23 0735 08/01/23 1159 08/01/23 1721 08/01/23 2033  GLUCAP 148* 125* 190* 144* 148*    Review of Systems:   Unable to be obtained secondary to the patient's intubated and sedated status.   Past Medical History  She,  has a past medical history of Acute incomplete  quadriplegia (HCC), Acute respiratory failure with hypoxia (HCC), AKI (acute kidney injury) (HCC), Arthritis, Blood dyscrasia, Central cord syndrome (HCC), Dental caries, Diabetes mellitus without complication (HCC), Diverticulosis, Dysphagia, Esophageal stricture, Facial cellulitis, GERD (gastroesophageal reflux disease), Hypothyroidism, Neuropathy, Odynophagia, Pelvic fracture (HCC), Renal insufficiency, Spinal cord compression, post-traumatic (HCC), Spinal cord injury, cervical region (HCC), and Stage 3a chronic kidney disease (CKD) (HCC).   Surgical History    Past Surgical History:  Procedure Laterality Date   ABDOMINAL HYSTERECTOMY     BILATERAL SALPINGECTOMY Bilateral 03/16/2017   Procedure: BILATERAL SALPINGECTOMY;  Surgeon: Schermerhorn, Ihor Austin, MD;  Location: [facility] ORS;  Service: Gynecology;  Laterality: Bilateral;   BIOPSY  05/03/2023   Procedure: BIOPSY;  Surgeon: Benancio Deeds, MD;  Location: [facility] ENDOSCOPY;  Service: Gastroenterology;;   COLONOSCOPY WITH PROPOFOL N/A 08/18/2018   Procedure: COLONOSCOPY WITH PROPOFOL;  Surgeon: Scot Jun, MD;  Location: [facility] ENDOSCOPY;  Service: Endoscopy;  Laterality: N/A;   CYSTOCELE REPAIR N/A 03/16/2017   Procedure: ANTERIOR REPAIR (CYSTOCELE);  Surgeon: Schermerhorn, Ihor Austin, MD;  Location: [facility] ORS;  Service: Gynecology;  Laterality: N/A;   ESOPHAGOGASTRODUODENOSCOPY (EGD) WITH PROPOFOL N/A 08/18/2018   Procedure: ESOPHAGOGASTRODUODENOSCOPY (EGD) WITH PROPOFOL;  Surgeon: Scot Jun, MD;  Location: [facility] ENDOSCOPY;  Service: Endoscopy;  Laterality: N/A;   ESOPHAGOGASTRODUODENOSCOPY (EGD) WITH PROPOFOL Left 05/01/2023   Procedure: ESOPHAGOGASTRODUODENOSCOPY (EGD) WITH PROPOFOL;  Surgeon: Benancio Deeds, MD;  Location: [facility] ENDOSCOPY;  Service: Gastroenterology;  Laterality: Left;   ESOPHAGOGASTRODUODENOSCOPY (EGD) WITH PROPOFOL N/A 05/03/2023   Procedure: ESOPHAGOGASTRODUODENOSCOPY (EGD) WITH PROPOFOL;  Surgeon: Benancio Deeds, MD;  Location: [facility] ENDOSCOPY;  Service: Gastroenterology;  Laterality: N/A;   POSTERIOR CERVICAL LAMINECTOMY N/A 07/28/2023   Procedure: C2 DOME LAMINECTOMY;  Surgeon: [name], MD;  Location: [facility] ORS;  Service: Neurosurgery;  Laterality: N/A;   TEAR DUCT PROBING Right    TUBAL LIGATION     VAGINAL HYSTERECTOMY N/A 03/16/2017   Procedure: HYSTERECTOMY VAGINAL;  Surgeon: Schermerhorn, Ihor Austin, MD;  Location: [facility] ORS;  Service: Gynecology;  Laterality: N/A;   WISDOM TOOTH EXTRACTION       Social History   reports that she has never smoked. She has never used smokeless tobacco. She reports that she does not drink alcohol and does not use drugs.   Family History   Her family history includes Breast cancer in her maternal aunt; Diabetes in her father; Hypertension in her father and mother.   Allergies Allergies  Allergen Reactions   Amoxicillin-Pot Clavulanate Itching and Anaphylaxis   Ampicillin Rash     Home Medications  Prior to Admission medications   Medication Sig Start Date End  Date Taking? Authorizing Provider  acetaminophen (TYLENOL) 650 MG CR tablet Take 650 mg by mouth every 8 (eight) hours as needed for pain.   Yes [provider]  chlorhexidine (HIBICLENS) 4 % external liquid Apply 15 mLs (1 Application total) topically as directed for 30 doses. Use as directed daily for 5 days every other week for 6 weeks. 07/28/23  Yes Joan Flores, PA-C  cyanocobalamin (VITAMIN B12) 1000 MCG tablet Take 1,000 mcg by mouth daily.   Yes [provider]  cyclobenzaprine (FLEXERIL) 10 MG tablet Take 1 tablet (10 mg total) by mouth at bedtime. 06/15/23  Yes Lovorn, Aundra Millet, MD  diclofenac Sodium (VOLTAREN) 1 % GEL Apply 2 g topically 4 (four) times daily. Patient taking differently: Apply 2 g topically 4 (four) times daily as needed (pain). 05/11/23  Yes Setzer, Lynnell Jude, PA-C  fludrocortisone (FLORINEF) 0.1 MG tablet Take 2 tablets (0.2 mg total) by mouth  daily. For orthostatic hypotension 06/15/23  Yes Lovorn, Aundra Millet, MD  gabapentin (NEURONTIN) 100 MG capsule Take 200 mg by mouth 2 (two) times daily.  08/16/15  Yes [provider]  glipiZIDE (GLUCOTROL) 5 MG tablet Take 10 mg by mouth daily before breakfast.   Yes [provider]  JARDIANCE 25 MG TABS tablet Take 25 mg by mouth daily.   Yes [provider]  lactase (LACTAID) 3000 units tablet Take 3,000 Units by mouth daily before breakfast.   Yes [provider]  levothyroxine (SYNTHROID) 88 MCG tablet Take 88 mcg by mouth daily before breakfast.   Yes [provider]  midodrine (PROAMATINE) 10 MG tablet Take 1.5 tablets (15 mg total) by mouth 3 (three) times daily with meals. For orthostatic hypotension 06/15/23  Yes Lovorn, Aundra Millet, MD  Multiple Vitamin (MULTIVITAMIN WITH MINERALS) TABS tablet Take 1 tablet by mouth daily. 04/22/23  Yes Sunnie Nielsen, DO  mupirocin ointment (BACTROBAN) 2 % Place 1 Application into the nose 2 (two) times daily for 60 doses. Use as directed 2 times daily for 5 days every other week for 6 weeks. 07/28/23 08/27/23 Yes Joan Flores, PA-C  pioglitazone (ACTOS) 30 MG tablet Take 30 mg by mouth daily.   Yes [provider]  Psyllium (METAMUCIL 3 IN 1 DAILY FIBER PO) Take 2 capsules by mouth daily.   Yes [provider]  rosuvastatin (CRESTOR) 5 MG tablet Take 5 mg by mouth at bedtime.   Yes [provider]  senna (SENOKOT) 8.6 MG TABS tablet Take 1 tablet (8.6 mg total) by mouth at bedtime. 05/11/23  Yes Setzer, Lynnell Jude, PA-C  Scheduled Meds:  [ZOX Hold] acidophilus  2 capsule Oral TID   [MAR Hold] cyanocobalamin  1,000 mcg Oral Daily   docusate  100 mg Per Tube BID   [MAR Hold] empagliflozin  25 mg Oral Daily   [MAR Hold] fludrocortisone  0.2 mg Oral Daily   [MAR Hold] guaiFENesin  5 mL Oral Q6H   [MAR Hold] insulin aspart  0-5 Units Subcutaneous QHS   [MAR Hold] ipratropium  0.5 mg Nebulization  BID   [MAR Hold] lactase  3,000 Units Oral QAC breakfast   [MAR Hold] levothyroxine  88 mcg Oral QAC breakfast   [MAR Hold] midodrine  5 mg Oral TID   [MAR Hold] mouth rinse  15 mL Mouth Rinse 4 times per day   polyethylene glycol  17 g Per Tube Daily   [MAR Hold] rosuvastatin  5 mg Oral QHS   [MAR Hold] senna  1 tablet Oral  QHS   [MAR Hold] sodium chloride flush  3-10 mL Intravenous Q12H   Continuous Infusions:  sodium chloride 50 mL/hr at 08/01/23 1055   [MAR Hold] ceFEPime (MAXIPIME) IV 2 g (08/01/23 1844)   fentaNYL infusion INTRAVENOUS     [MAR Hold] metronidazole 500 mg (08/01/23 1845)   propofol (DIPRIVAN) infusion     [MAR Hold] vancomycin (VANCOCIN) 750 mg in sodium chloride 0.9 % 250 mL IVPB     PRN Meds:.0.9 % irrigation (POUR BTL), [MAR Hold] alum & mag hydroxide-simeth, [MAR Hold] benzonatate, [MAR Hold] bisacodyl, bupivacaine-epinephrine (PF), fentaNYL, hemostatic agents (no charge) Optime, [MAR Hold] levalbuterol, [MAR Hold] menthol-cetylpyridinium **OR** [DISCONTINUED] phenol, [MAR Hold] senna-docusate, [MAR Hold] sodium chloride flush, Surgiflo with Thrombin (Hemostatic Matrix Kit) Optime  Active Hospital Problem list   See systems below  Assessment & Plan:  #Acute Hypoxic Respiratory Failure #HCAP #Post Op Atelectasis -LTVV -VAP prevention protocol -PRN &Scheduled bronchodilators -daily SAT & SBT -Wean PEEP and FiO2 for sats > 90% -Follow chest x-ray, ABG prn.  -prn fentanyl, propofol  for RASS -1   #Cervical Myelopathy PMHx: Cervical stenosis, Cervical Spinal Cord Injury s/p fall s/p C2 dome laminectomy, C3-6 laminoplasty on 1/21 now complicated by Epidural Hematoma s/p surgical decompression 1/26 -Neurosurgery following, apprec ~ Follow recommendation as below: -Vasopressors as needed to maintain MAP goal >85 -Hold midodrine and Florinef while on pressors -Neuro checks q2h -PT/OT once stable -Foley catheter for urinary retention -Pain management  (Fentanyl while intubated)   #HCAP Met SIRS criteria: tachycardia, sob, hypoxia CT chest shows Left lower lobe pneumonia -Monitor fever curve -Trend WBC's & Procalcitonin -Follow cultures as above -Continue empiric abx ppx pending cultures & sensitivities  #Acute Blood Loss Anemia Post Op #Hx of Blood Dyscrasia -Monitor for S/Sx of bleeding -Trend CBC (H&H q6h) -SCD's for VTE Prophylaxis (chemical ppx contraindicated) -Transfuse for Hgb <7  (has received 2 units pRBC's so far on 1/25)  #AKI on CKD stage III #NAGMA -Monitor I&O's / urinary output -Follow BMP -Ensure adequate renal perfusion -Avoid nephrotoxic agents as able -Replace electrolytes as indicated  #T2DM -HgbA1c goal  -CBG's q4; Target range of 140 to 180 -SSI -Follow ICU Hypo/Hyperglycemia protocol -Hold home Glipizide    #Sedation needs in setting of mechanical ventilation -Maintain a RASS goal of 0 to -1 -Propofol and Fentanyl to maintain RASS goal -Avoid sedating medications as able -Daily wake up assessment   #Hypothyroidism -Check TSH, Free T4 -continue home synthroid  Best practice:  Diet:  NPO Pain/Anxiety/Delirium protocol (if indicated): Yes (RASS goal -1) VAP protocol (if indicated): Yes DVT prophylaxis: Contraindicated GI prophylaxis: H2B Glucose control:  SSI Yes Central venous access:  N/A Arterial line:  Yes, and it is still needed Foley:  Yes, and it is still needed Mobility:  bed rest  PT consulted: N/A Last date of multidisciplinary goals of care discussion []  Code Status:  full code Disposition: ICU   = Goals of Care = Code Status Order: FULL  Primary Emergency Contact: Ketron,Timothy S, Home Phone: [phone] Wishes to pursue full aggressive treatment and intervention options, including CPR and intubation, but goals of care will be addressed on going with family if that should become necessary.  Critical care time: 45 minutes        Webb Silversmith DNP, CCRN,  FNP-C, AGACNP-BC Acute Care & Family Nurse Practitioner  Pulmonary & Critical Care Medicine PCCM on call pager [phone]

## 2023-08-02 NOTE — Op Note (Signed)
Indications: [name] is a 68 y.o. female with history of a previous cervical spinal cord injury.  She was admitted recently for a cervical laminoplasty and decompression of her C2 inferior lamina.  Neurologically she was recovering well however was having some medical comorbidities including a pneumonia development and some encephalopathy as well.  On postoperative day 4 she had an acute decline in her neurologic status with weakness starting in her left upper extremity and then spreading to her bilateral lower extremities.  This was a vast change as she had had some progressive improvements in her functional status over the preceding few days.  The this starting in the left arm and bilateral lower extremities was associated with significant mount of pain.  The on-call team ordered a CT scan which did not demonstrate any acute changes, however given the continued neurologic deficit order to stat MRI which demonstrated a large extensive epidural hematoma with hyperacute characteristics of the bleeding pattern.  For this she was taken emergently to the operating room for completion of her cervical laminectomy and evacuation of the epidural hematoma  Findings: High-pressure spinal epidural hematoma noted down to the level of T8/9.  Preoperative Diagnosis: Cervical myelopathy Postoperative Diagnosis: same   EBL: IVF: see anesthesia record Drains: subfascial JP none Disposition: Intubated to ICU Complications: none  No foley catheter was placed.   Preoperative Note:   Risks of surgery discussed include: infection, bleeding, stroke, coma, death, paralysis, CSF leak, nerve/spinal cord injury, numbness, tingling, weakness, complex regional pain syndrome, recurrent stenosis and/or disc herniation, vascular injury, development of instability, neck/back pain, need for further surgery, persistent symptoms, failure to improve her spinal cord injury, development of deformity, and the risks of  anesthesia. The patient understood these risks and agreed to proceed.  Operative Note:   OPERATIVE PROCEDURE:  1. C3-6 Laminectomy for hematoma Evacuation 2. T2-3 Hemilaminectomy for Hematoma Evacuation(Left) 3. T5-6 Hemilaminectomy for Hematoma Evacuation(Left) 4. T8-9 Hemilaminectomy for Hematoma Evacuation(Left)  OPERATIVE PROCEDURE:  After induction of general anesthesia, the patient was placed in the prone position on the operative table.  A midline incision was then planned using fluoroscopy.  A timeout was performed, and antibiotics given.  Next, the posterior cervical region was prepped and draped in the usual sterile fashion. The incision was injected with local anesthetic, then opened sharply.  We started first with a cervical exploration.  The skin and subcutaneous tissues were opened.  The indwelling sutures were removed.  We continue to work her way down to the spinal location where the laminoplasty was noted.  Upon inspecting we did not see any change in location or displacement of her spinal instrumentation.  We did not notice any active arterial level bleeding or high-volume venous bleeding at this time.  She was however having significant paraspinal bleeding at most regions of new dissection.  Because of this we had to use bipolar type cautery for some of the exposure to help decrease the amount of blood loss.  We then were able to complete the laminectomy from C3-C6 with Kerrison rongeurs after removing the laminoplasty plates.  We had a good central decompression noted.  There were some epidural complexes of veins that had some leakage but no high pressure arterial bleeding was not discovered.  Once we had the cervical spine adequately decompressed we explored both the C2 inferior decompression and extended this slightly, and also looked at the C7 junction.  We exposed more of the thoracic spine at this point to get access  to the large predominantly left-sided acute hemorrhage.  We  started our first hemilaminectomy at T2-3, we remove the bone with a high-speed drill, given the amount of bleeding we decided to perform a Hemi laminectomy/unilateral approach as there was concern for a bleeding dyscrasia.  The high-speed drill was utilized to perform the hemilaminectomy to the level of the insertion of the ligamentum flavum on the deep side of the lamina.  Utilized an upgoing curette to mobilize this layer and upon doing so encountered a very high pressure hematoma that was predominantly liquid.  Copious amount of bleeding came out at this time and we continue to evacuated.  Once this slowed down we widened our exposure medially and were able to see good rebound of the spinal cord at this level.  We dissected off any of the more organized blood clot of which there was some at this level, we did find a significant amount of oozing coming from a epidural venous complex at this level which was coagulated.  We then continued to expose and isolated ourselves over T5-6 where we performed a another hemilaminectomy in the same fashion.  When we did this we again found high-pressure blood albeit less pressure than the previous level and also lower in volume.  There was less organized blood clot at this level as well.  We evacuated this and were able to see clear rebound of the thecal sac at this level.  Given the good rebound we then took a red rubber catheter and introduced it into the epidural space to verify that we were able to communicate the 2 spaces and that there was no hematoma or obstruction in between.  We are able to do this until there was clear flushing from the red rubber catheter.  We then moved down to the T8-9 level where we again performed a hemilaminectomy in the same fashion above with a high-speed drill once we got to the level of the superior insertion of the ligamentum on the underside of the lamina, we encountered only a small amount of bleeding at this level, it was not under  any more significant pressure and the cord rebounded adequately.  We repeated the process with the red rubber catheter from the T8-9 level to the T5-6 level.  We are able to get clear flush at this level.  Once we observed we continue to see some bleeding that appeared to be acute given the bright red nature of the blood.  We are able to isolate this to the T5-6 level where at the inferior aspect of this there was another venous complex that was actively leaking.  Attempted to coagulate it with our current exposure but needed to expose further caudally to get good control of the vascular tangle.  With bipolar cautery we are able to control the bleeding.  At this point there was no more excess bleeding noted.  We irrigated copiously.  We irrigated from defect a defect to make sure that they were all communicating in which they were.  At this point because we could see the spinal cord had rebounded at all of the exposed levels we move forward with subfascial drain placement and closure.  The wound was copiously irrigated, we utilized bipolar cautery to stop any muscle bleeding that we noted.  Given her bleeding history we placed a subfascial drain over top of the epidural space in the cervical spine.  We then closed in multiple layers.  The most superficial layer was closed with staples.  Sponge and patty counts were correct at the end of the procedure.  The patient was kept intubated given her acute decline, respiratory status, and invasive nature of the procedure.  She was sent to the intensive care unit for blood pressure management in the setting of an acute spinal cord injury secondary to a hematoma.  Intraoperative fluoroscopy was utilized throughout the procedure to verify her levels and to localize our positioning at each of the laminectomy sites.  After closure, the patient was flipped supine. Patient was then handed back over to anesthesia.  All counts were correct at the conclusion of the procedure.   Neurological monitoring was not used during this procedure given the emergent aspect as well as lack of any lower extremity function for monitoring.  I performed this procedure without an Designer, television/film set.  [name] MD  * No implants in log *

## 2023-08-02 NOTE — Progress Notes (Addendum)
eLink Physician-Brief Progress Note Patient Name: [name] DOB: 1956/05/01 MRN: [id number]   Date of Service  08/02/2023  HPI/Events of Note  68 year old female admitted to ICU post cervical decompression and evacuation of epidural hematoma.  She was brought to the ICU intubated.  She had had an elective cervical laminoplasty done on the 21st of this month.  She subsequently developed weakness and was noted to find a epidural hematoma extending from the cervical spine to the lower thoracic spine.  Taken urgently to the OR for decompression.    eICU Interventions  Patient's chart reviewed.  Pertinent labs and imaging studies reviewed.  Visual assessment of patient done.  She is intubated and sedated.  In no distress. Keep sedated.  Will attempt extubation when okay with surgery.     Intervention Category Evaluation Type: New Patient Evaluation  Carilyn Goodpasture 08/02/2023, 3:43 AM

## 2023-08-02 NOTE — Progress Notes (Signed)
Neurosurgery Progress Note  History: [name] is here for C2 dome laminectomy, C3-6 laminoplasty and now s/p evacuation hematoma  Interval POD5: [name] had a decline yesterday with her left upper extremity and then bilateral lower extremities.  CT scan was unremarkable MRI did show a large epidural hematoma.  She was taken emergently to the OR by Dr. Katrinka Blazing for decompression.  She has been monitored in the ICU overnight. She is still intubated and on sedation. She is on pressors to maintain [facility]   Hospital Course POD4: [name] is more awake today. She states she isnt having a lot of pain but she is thirsty.  POD3:Patient more alert this AM. However; hypoxia, tachycardia, SOB, low energy.  POD2: Very lethargic and difficult to arouse, states in quite a bit of pain. Medicine had been held due to sleepiness.    Physical Exam: Vitals:   08/02/23 0945 08/02/23 1000  BP:  (!) 145/83  Pulse: 61 (!) 58  Resp: 18 18  Temp:    SpO2: 99% 99%     Awake today and conversant Strength appears to be at least 4 throughout upper and lower extremities with right hip having some pain /5   Data:  Output by Drain (mL) 07/31/23 0701 - 07/31/23 1900 07/31/23 1901 - 08/01/23 0700 08/01/23 0701 - 08/01/23 1900 08/01/23 1901 - 08/02/23 0700 08/02/23 0701 - 08/02/23 1008  Closed System Drain Right;Superior;Medial Back    50     Exam: Intubated and sedated  Assessment/Plan:  [name] POD5 s/p C2 dome laminectomy, C3-6 laminoplasty  POD1 cervical/thoracic laminectomy and epidural hematoma evacuation  -Continue to monitor in ICU, recommend continued maintenance of MAP pressures above 80 mmHg - Wean to extubate - pain control continue with IV and oral medications - DVT prophylaxis held today - Hemovac in place (50 cc out)  - PT/OT when able -Will need bedside swallow when able  #Pneumonia - diagnosed 1/24 via CT, started on broad spectrum antibiotics. Hospitalist consulted     Lucy Chris, MD Department of Neurosurgery

## 2023-08-03 ENCOUNTER — Inpatient Hospital Stay: Payer: Medicare Other

## 2023-08-03 DIAGNOSIS — E440 Moderate protein-calorie malnutrition: Secondary | ICD-10-CM | POA: Insufficient documentation

## 2023-08-03 LAB — CBC
HCT: 25.1 % — ABNORMAL LOW (ref 36.0–46.0)
Hemoglobin: 8.3 g/dL — ABNORMAL LOW (ref 12.0–15.0)
MCH: 29.2 pg (ref 26.0–34.0)
MCHC: 33.1 g/dL (ref 30.0–36.0)
MCV: 88.4 fL (ref 80.0–100.0)
Platelets: 216 10*3/uL (ref 150–400)
RBC: 2.84 MIL/uL — ABNORMAL LOW (ref 3.87–5.11)
RDW: 14.4 % (ref 11.5–15.5)
WBC: 9.8 10*3/uL (ref 4.0–10.5)
nRBC: 0.2 % (ref 0.0–0.2)

## 2023-08-03 LAB — BLOOD GAS, ARTERIAL
Acid-base deficit: 8.3 mmol/L — ABNORMAL HIGH (ref 0.0–2.0)
Acid-base deficit: 8.2 mmol/L — ABNORMAL HIGH (ref 0.0–2.0)
Bicarbonate: 16.2 mmol/L — ABNORMAL LOW (ref 20.0–28.0)
Bicarbonate: 16 mmol/L — ABNORMAL LOW (ref 20.0–28.0)
FIO2: 0.36 %
O2 Content: 4 L/min
O2 Saturation: 92 %
O2 Saturation: 92.5 %
Patient temperature: 37
Patient temperature: 37
pCO2 arterial: 30 mm[Hg] — ABNORMAL LOW (ref 32–48)
pCO2 arterial: 29 mm[Hg] — ABNORMAL LOW (ref 32–48)
pH, Arterial: 7.34 — ABNORMAL LOW (ref 7.35–7.45)
pH, Arterial: 7.35 (ref 7.35–7.45)
pO2, Arterial: 61 mm[Hg] — ABNORMAL LOW (ref 83–108)
pO2, Arterial: 66 mm[Hg] — ABNORMAL LOW (ref 83–108)

## 2023-08-03 LAB — TYPE AND SCREEN
Unit division: 0
Unit division: 0
Unit division: 0
Unit division: 0

## 2023-08-03 LAB — COMPREHENSIVE METABOLIC PANEL
ALT: 16 U/L (ref 0–44)
AST: 22 U/L (ref 15–41)
Albumin: 2.6 g/dL — ABNORMAL LOW (ref 3.5–5.0)
Alkaline Phosphatase: 51 U/L (ref 38–126)
Anion gap: 12 (ref 5–15)
BUN: 50 mg/dL — ABNORMAL HIGH (ref 8–23)
CO2: 17 mmol/L — ABNORMAL LOW (ref 22–32)
Calcium: 7.9 mg/dL — ABNORMAL LOW (ref 8.9–10.3)
Chloride: 113 mmol/L — ABNORMAL HIGH (ref 98–111)
Creatinine, Ser: 1.13 mg/dL — ABNORMAL HIGH (ref 0.44–1.00)
GFR, Estimated: 54 mL/min — ABNORMAL LOW (ref >60)
Glucose, Bld: 114 mg/dL — ABNORMAL HIGH (ref 70–99)
Potassium: 4.1 mmol/L (ref 3.5–5.1)
Sodium: 142 mmol/L (ref 135–145)
Total Bilirubin: 0.9 mg/dL (ref 0.0–1.2)
Total Protein: 5.8 g/dL — ABNORMAL LOW (ref 6.5–8.1)

## 2023-08-03 LAB — BPAM RBC
Blood Product Expiration Date: 202502232359
Blood Product Expiration Date: 202502232359
Blood Product Expiration Date: 202502232359
ISSUE DATE / TIME: 202501260049
ISSUE DATE / TIME: 202501260049
ISSUE DATE / TIME: 202501270739
ISSUE DATE / TIME: 202502182359
Unit Type and Rh: 202502182359
Unit Type and Rh: 202502232359
Unit Type and Rh: 6200
Unit Type and Rh: 6200
Unit Type and Rh: 6200
Unit Type and Rh: 6200

## 2023-08-03 LAB — GLUCOSE, CAPILLARY
Glucose-Capillary: 126 mg/dL — ABNORMAL HIGH (ref 70–99)
Glucose-Capillary: 114 mg/dL — ABNORMAL HIGH (ref 70–99)
Glucose-Capillary: 127 mg/dL — ABNORMAL HIGH (ref 70–99)
Glucose-Capillary: 164 mg/dL — ABNORMAL HIGH (ref 70–99)
Glucose-Capillary: 94 mg/dL (ref 70–99)

## 2023-08-03 LAB — LACTIC ACID, PLASMA: Lactic Acid, Venous: 0.9 mmol/L (ref 0.5–1.9)

## 2023-08-03 LAB — STREP PNEUMONIAE URINARY ANTIGEN: Strep Pneumo Urinary Antigen: NEGATIVE

## 2023-08-03 LAB — TRIGLYCERIDES: Triglycerides: 306 mg/dL — ABNORMAL HIGH (ref <150)

## 2023-08-03 MED ORDER — FENTANYL CITRATE PF 50 MCG/ML IJ SOSY
12.5000 ug | PREFILLED_SYRINGE | Freq: Once | INTRAMUSCULAR | Status: AC
  Administered 2023-08-03: 12.5 ug via INTRAVENOUS
  Filled 2023-08-03: qty 1

## 2023-08-03 MED ORDER — SODIUM CHLORIDE 0.9 % IV SOLN
100.0000 mg | Freq: Two times a day (BID) | INTRAVENOUS | Status: DC
  Administered 2023-08-03 – 2023-08-10 (×14): 100 mg via INTRAVENOUS
  Filled 2023-08-03 (×15): qty 100

## 2023-08-03 MED ORDER — SODIUM BICARBONATE 8.4 % IV SOLN
50.0000 meq | Freq: Once | INTRAVENOUS | Status: AC
  Administered 2023-08-03: 50 meq via INTRAVENOUS
  Filled 2023-08-03: qty 50

## 2023-08-03 MED ORDER — DEXAMETHASONE SODIUM PHOSPHATE 4 MG/ML IJ SOLN
4.0000 mg | INTRAMUSCULAR | Status: DC
  Administered 2023-08-03 – 2023-08-04 (×2): 4 mg via INTRAVENOUS
  Filled 2023-08-03 (×2): qty 1

## 2023-08-03 MED ORDER — CHLORHEXIDINE GLUCONATE CLOTH 2 % EX PADS
6.0000 | MEDICATED_PAD | Freq: Every day | CUTANEOUS | Status: DC
  Administered 2023-08-04 – 2023-08-20 (×17): 6 via TOPICAL

## 2023-08-03 MED ORDER — ALBUMIN HUMAN 25 % IV SOLN
12.5000 g | Freq: Every day | INTRAVENOUS | Status: DC
  Administered 2023-08-03 – 2023-08-15 (×13): 12.5 g via INTRAVENOUS
  Filled 2023-08-03 (×14): qty 50

## 2023-08-03 MED ORDER — ACETAMINOPHEN 10 MG/ML IV SOLN
1000.0000 mg | Freq: Four times a day (QID) | INTRAVENOUS | Status: AC
  Administered 2023-08-03: 1000 mg via INTRAVENOUS
  Filled 2023-08-03: qty 100

## 2023-08-03 MED ORDER — LACTATED RINGERS IV SOLN: INTRAVENOUS | Status: AC

## 2023-08-03 NOTE — Progress Notes (Signed)
Patient alert x2, on pressor support, [place] @ 4L.   Blood pressure medications titrated per verbal NP orders, to trial wean down within set goal parameters.   Patient to IR. Patient desaturated after NG placement. Placed on NRB to return to ICU, patient placed on HF at 15L on return.    NG removed per NP.   Family at bedside, updated.

## 2023-08-03 NOTE — Progress Notes (Signed)
Neurosurgery Progress Note  History: [name] is here for C2 dome laminectomy, C3-6 laminoplasty and now s/p evacuation hematoma  POD6/2: Pt is drowsy this morning asking "what do you want" when awaken  Interval POD5: [name] had a decline yesterday with her left upper extremity and then bilateral lower extremities.  CT scan was unremarkable MRI did show a large epidural hematoma.  She was taken emergently to the OR by Dr. Katrinka Blazing for decompression.  She has been monitored in the ICU overnight. She is still intubated and on sedation. She is on pressors to maintain [facility]   Hospital Course POD4: [name] is more awake today. She states she isnt having a lot of pain but she is thirsty.  POD3:Patient more alert this AM. However; hypoxia, tachycardia, SOB, low energy.  POD2: Very lethargic and difficult to arouse, states in quite a bit of pain. Medicine had been held due to sleepiness.    Physical Exam: Vitals:   08/03/23 0600 08/03/23 0615  BP: (!) 158/57   Pulse: 87 85  Resp: 20 (!) 26  Temp: 98.2 F (36.8 C) 98.2 F (36.8 C)  SpO2: 93% 94%    Drowsy but arouses briefly to noxious stimuli. Oriented to person and place.  Flicker of movement in BLE to pain. No movement in LUE and localizes with RUE HV output 20  Data:  Output by Drain (mL) 08/01/23 0701 - 08/01/23 1900 08/01/23 1901 - 08/02/23 0700 08/02/23 0701 - 08/02/23 1900 08/02/23 1901 - 08/03/23 0651  Closed System Drain Right;Superior;Medial Back  50 20      Assessment/Plan:  [name] POD5 s/p C2 dome laminectomy, C3-6 laminoplasty  On 1/21 with return to OR on 1/25 for cervical/thoracic laminectomy and epidural hematoma evacuation  -Continue to monitor in ICU, recommend continued maintenance of MAP pressures above 80 mmHg x 3 days total. - pain control continue with IV and oral medications - Continue to hold DVT prophylaxis  - PT/OT when able - will continue HV at this time.  - Multiple attempts to  place a dobhoff tube were unsuccessful over the weekend. Discussed re-atempting with CC team this morning. - pneumonia - diagnosed 1/24 via CT, started on broad spectrum antibiotics. Hospitalist consulted - Hem consult placed as patient has a history of delayed bleeding after surgery - CC and IM assisting with non surgical management. We appreciate your assistance.    Manning Charity PA-C Department of Neurosurgery

## 2023-08-03 NOTE — Progress Notes (Signed)
NAME:  [name], MRN:  [id number], DOB:  Dec 23, 1955, LOS: 6 ADMISSION DATE:  07/28/2023, CONSULTATION DATE:  08/02/23 REFERRING MD:  Ernestine Mcmurray CHIEF COMPLAINT:  cervical myelopathy  HPI  [name] is a 68 year old old female with PMHx of hypothyroidism, insulin-dependent DM, CKD stage III, dysphagia with esophageal strictures, facial cellulitis, GERD, neuropathy, arthritis, blood dyscrasia, and recent traumatic cervical central cord injury who presented to the hospital for elective cervical spine surgery.  Per patient's chart, patient was seen by neurosurgery on 07/28/2023 for follow-up after suffering a fall 3 months ago in which she sustained a cervical spinal cord injury.  Per patient, they deferred emergent surgical depression and wanted to let her have time to recover and work with physical therapy.  She continued to have hand weakness as well as lower extremity weakness and ambulatory issues.  Given the ongoing compression of C3-C6, laminoplasty and C2 dome laminectomy was indicated.    Hospital course: Patient underwent C3-6 laminoplasty and C2 dome and laminectomy surgery on 07/29/2023 with drain placement.   Pertinent Labs/Diagnostics Findings: Na+/ K+: 141/4.4 Glucose: 136 BUN/Cr.47/1.42 WBC: 5.8 K/L  Hgb/Hct: 7.4/22.4  Past Medical History  Hypothyroidism, insulin-dependent DM, CKD stage III, dysphagia with esophageal strictures, facial cellulitis, GERD, neuropathy, arthritis, blood dyscrasia, and recent traumatic cervical central cord injury  Significant Hospital Events   01/21: Patient underwent posterior C3-6 Cervical Laminoplasty with C2 inferior dome Laminectomy 01/22: POD #1. Pt. stable with no acute changes.  Working with PT and OT 01/23: POD #2.  Pt. noted to be very lethargic and difficult to arouse 01/24: POD #3. Pt. developed hypoxia tachycardia and shortness of breath.  Hospitalist consulted CT chest obtained and showed possible pneumonia.  Started on IV  antibiotics and DuoNebs. 01/25: POD #4. Pt. with new concerns of not moving the left arm.  CT head and cervical spine obtained with no new concerns.  Follow-up MRI however demonstrated epidural hematoma extending from the cervical spine to the lower thoracic spine with acute compression and T2 signal.  Taken emergently to the OR for surgical decompression 01/26: Pt. transferred to the ICU s/p emergent cervical decompression and evacuation of epidural hematoma.  Remained intubated. PCCM consulted 08/03/23- s/p surgery overnight, alert to self but drowsy with mild AKI and metabolic acidosis.  Lethargy noted have dcd cefepime and started doxycycline instead.   Consults:  PCCM Neurosurgery Hospitalist  Procedures:  1/21:posterior C3-6 Cervical Laminoplasty with C2 inferior dome Laminectomy 1/25: s/p Epidural Hematoma Decompression 1/25: Intubation 1/25: Arterial Line  Significant Diagnostic Tests:  1/25:MRI  Thoracic and Cervical Spine> IMPRESSION: 1. Postoperative changes from recent C3 through C6 laminectomy with laminoplasty. Associated fractures better appreciated on prior CT. 2. Dorsal epidural collection extending from C2-3 through T9, likely epidural hematoma. Resultant severe diffuse spinal stenoses stenosis with cord compression at these levels. 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 Emergent neuro surgical 3. Underlying multilevel cervical spondylosis with resultant multilevel foraminal narrowing as above. Notable findings include severe left C4 foraminal stenosis, moderate bilateral C5 foraminal narrowing, with mild bilateral C6 and right C7 foraminal stenosis.  1/25: Noncontrast CT Head &Cervical Spine> IMPRESSION: 1. No acute intracranial abnormality. 2. Mild cortical atrophy, within normal limits for patient age. 3. Improved paranasal sinus mucosal opacification as  described above.  1/24: CTA Chest> IMPRESSION: Left lower lobe pneumonia without sizable effusion.   Micro Data:  1/24: Blood culture x2> 1/24: MRSA PCR>> positive 1/24: Strep  pneumo urinary antigen>  Antimicrobials:  1/24 Vancomycin >>  1/24 Cefepime >>  OBJECTIVE  Blood pressure (!) 165/59, pulse 86, temperature 98.1 F (36.7 C), resp. rate 15, height 5\' 7"  (1.702 m), weight 58.9 kg, SpO2 90%.      Intake/Output Summary (Last 24 hours) at 08/03/2023 1100 Last data filed at 08/03/2023 0700 Gross per 24 hour  Intake 1559.08 ml  Output 1940 ml  Net -380.92 ml   Filed Weights   07/28/23 0615  Weight: 58.9 kg   Physical Examination  GENERAL: lying down in bed, no apparent distress -encephalopathy noted CVS: RRR, no carotid bruit. LUNGS: decreased air entry on left with rhonchi ABDOMEN: Soft, NTTP EXTREMITIES: no edema or cyanosis NEURO: MENTAL STATUS: sedated on propofol LANG/SPEECH: non-verbal (sedated) CRANIAL NERVES: Pupils are equal and reactive, face symmetric - poor cough and gag to suctioning, rest of cranial nerves were deferred due to sedation. MOTOR: no spontaneous movements - no withdrawal to pain on either side (sedated) REFLEXES: hyporeflexic bilaterally SENSORY: no reaction to pain in both sides GAIT: deferred - sedated SKIN: intact except as below:      Labs/imaging that I havepersonally reviewed  (right click and "Reselect all SmartList Selections" daily)     Labs   CBC: Recent Labs  Lab 07/31/23 1457 08/01/23 0423 08/01/23 2005 08/02/23 0315 08/03/23 0450  WBC 6.7 6.0 5.8 5.2 9.8  HGB 8.8* 8.1* 7.4* 8.7* 8.3*  HCT 26.8* 24.3* 22.4* 26.4* 25.1*  MCV 88.4 87.4 89.2 90.4 88.4  PLT 176 173 174 131* 216    Basic Metabolic Panel: Recent Labs  Lab 07/28/23 1534 07/30/23 2136 08/01/23 0423 08/02/23 0315 08/03/23 0450  NA  --  140 141 138 142  K  --  4.8 4.4 5.1 4.1  CL  --  104 108 107 113*  CO2  --  23 20* 16* 17*  GLUCOSE  --   86 136* 193* 114*  BUN  --  38* 47* 44* 50*  CREATININE 1.21* 1.29* 1.42* 1.08* 1.13*  CALCIUM  --  9.5 9.0 8.0* 7.9*   GFR: Estimated Creatinine Clearance: 46.2 mL/min (A) (by C-G formula based on SCr of 1.13 mg/dL (H)). Recent Labs  Lab 07/31/23 1457 07/31/23 1731 08/01/23 0423 08/01/23 2005 08/02/23 0315 08/03/23 0450  WBC 6.7  --  6.0 5.8 5.2 9.8  LATICACIDVEN 1.2 1.8  --   --  1.8  --     Liver Function Tests: Recent Labs  Lab 08/02/23 0315 08/03/23 0450  AST 26 22  ALT 13 16  ALKPHOS 48 51  BILITOT 1.0 0.9  PROT 5.8* 5.8*  ALBUMIN 2.7* 2.6*   No results for input(s): "LIPASE", "AMYLASE" in the last 168 hours. No results for input(s): "AMMONIA" in the last 168 hours.  ABG    Component Value Date/Time   PHART 7.34 (L) 08/03/2023 0808   PCO2ART 30 (L) 08/03/2023 0808   PO2ART 61 (L) 08/03/2023 0808   HCO3 16.2 (L) 08/03/2023 0808   ACIDBASEDEF 8.3 (H) 08/03/2023 0808   O2SAT 92 08/03/2023 0808     Coagulation Profile: Recent Labs  Lab 08/01/23 2005  INR 1.3*    Cardiac Enzymes: No results for input(s): "CKTOTAL", "CKMB", "CKMBINDEX", "TROPONINI" in the last 168 hours.  HbA1C: Hemoglobin A1C  Date/Time Value Ref Range Status  02/20/2013 04:02 AM 10.3 (H) 4.2 - 6.3 % Final    Comment:    The American Diabetes Association recommends that a primary goal of therapy should be <  7% and that physicians should reevaluate the treatment regimen in patients with HbA1c values consistently >8%.    Hgb A1c MFr Bld  Date/Time Value Ref Range Status  04/13/2023 11:34 AM 5.9 (H) 4.8 - 5.6 % Final    Comment:    (NOTE) Pre diabetes:          5.7%-6.4%  Diabetes:              >6.4%  Glycemic control for   <7.0% adults with diabetes   08/11/2021 08:39 PM 7.2 (H) 4.8 - 5.6 % Final    Comment:    (NOTE) Pre diabetes:          5.7%-6.4%  Diabetes:              >6.4%  Glycemic control for   <7.0% adults with diabetes    CBG: Recent Labs  Lab  08/02/23 1604 08/02/23 1925 08/02/23 2307 08/03/23 0306 08/03/23 0743  GLUCAP 150* 117* 109* 126* 114*    Review of Systems:   Unable to be obtained secondary to the patient's intubated and sedated status.   Past Medical History  She,  has a past medical history of Acute incomplete quadriplegia (HCC), Acute respiratory failure with hypoxia (HCC), AKI (acute kidney injury) (HCC), Arthritis, Blood dyscrasia, Central cord syndrome (HCC), Dental caries, Diabetes mellitus without complication (HCC), Diverticulosis, Dysphagia, Esophageal stricture, Facial cellulitis, GERD (gastroesophageal reflux disease), Hypothyroidism, Neuropathy, Odynophagia, Pelvic fracture (HCC), Renal insufficiency, Spinal cord compression, post-traumatic (HCC), Spinal cord injury, cervical region (HCC), and Stage 3a chronic kidney disease (CKD) (HCC).   Surgical History    Past Surgical History:  Procedure Laterality Date   ABDOMINAL HYSTERECTOMY     BILATERAL SALPINGECTOMY Bilateral 03/16/2017   Procedure: BILATERAL SALPINGECTOMY;  Surgeon: Schermerhorn, Ihor Austin, MD;  Location: [facility] ORS;  Service: Gynecology;  Laterality: Bilateral;   BIOPSY  05/03/2023   Procedure: BIOPSY;  Surgeon: Benancio Deeds, MD;  Location: [facility] ENDOSCOPY;  Service: Gastroenterology;;   COLONOSCOPY WITH PROPOFOL N/A 08/18/2018   Procedure: COLONOSCOPY WITH PROPOFOL;  Surgeon: Scot Jun, MD;  Location: [facility] ENDOSCOPY;  Service: Endoscopy;  Laterality: N/A;   CYSTOCELE REPAIR N/A 03/16/2017   Procedure: ANTERIOR REPAIR (CYSTOCELE);  Surgeon: Schermerhorn, Ihor Austin, MD;  Location: [facility] ORS;  Service: Gynecology;  Laterality: N/A;   ESOPHAGOGASTRODUODENOSCOPY (EGD) WITH PROPOFOL N/A 08/18/2018   Procedure: ESOPHAGOGASTRODUODENOSCOPY (EGD) WITH PROPOFOL;  Surgeon: Scot Jun, MD;  Location: [facility] ENDOSCOPY;  Service: Endoscopy;  Laterality: N/A;   ESOPHAGOGASTRODUODENOSCOPY (EGD) WITH PROPOFOL Left 05/01/2023   Procedure:  ESOPHAGOGASTRODUODENOSCOPY (EGD) WITH PROPOFOL;  Surgeon: Benancio Deeds, MD;  Location: [facility] ENDOSCOPY;  Service: Gastroenterology;  Laterality: Left;   ESOPHAGOGASTRODUODENOSCOPY (EGD) WITH PROPOFOL N/A 05/03/2023   Procedure: ESOPHAGOGASTRODUODENOSCOPY (EGD) WITH PROPOFOL;  Surgeon: Benancio Deeds, MD;  Location: [facility] ENDOSCOPY;  Service: Gastroenterology;  Laterality: N/A;   POSTERIOR CERVICAL LAMINECTOMY N/A 07/28/2023   Procedure: C2 DOME LAMINECTOMY;  Surgeon: [name], MD;  Location: [facility] ORS;  Service: Neurosurgery;  Laterality: N/A;   POSTERIOR CERVICAL LAMINECTOMY N/A 08/01/2023   Procedure: POSTERIOR CERVICAL LAMINECTOMY;  Surgeon: [name], MD;  Location: [facility] ORS;  Service: Neurosurgery;  Laterality: N/A;   TEAR DUCT PROBING Right    TUBAL LIGATION     VAGINAL HYSTERECTOMY N/A 03/16/2017   Procedure: HYSTERECTOMY VAGINAL;  Surgeon: Schermerhorn, Ihor Austin, MD;  Location: [facility] ORS;  Service: Gynecology;  Laterality: N/A;   WISDOM TOOTH EXTRACTION       Social  History   reports that she has never smoked. She has never used smokeless tobacco. She reports that she does not drink alcohol and does not use drugs.   Family History   Her family history includes Breast cancer in her maternal aunt; Diabetes in her father; Hypertension in her father and mother.   Allergies Allergies  Allergen Reactions   Amoxicillin-Pot Clavulanate Itching and Anaphylaxis   Ampicillin Rash     Home Medications  Prior to Admission medications   Medication Sig Start Date End Date Taking? Authorizing Provider  acetaminophen (TYLENOL) 650 MG CR tablet Take 650 mg by mouth every 8 (eight) hours as needed for pain.   Yes [provider]  chlorhexidine (HIBICLENS) 4 % external liquid Apply 15 mLs (1 Application total) topically as directed for 30 doses. Use as directed daily for 5 days every other week for 6 weeks. 07/28/23  Yes Joan Flores, PA-C  cyanocobalamin (VITAMIN  B12) 1000 MCG tablet Take 1,000 mcg by mouth daily.   Yes [provider]  cyclobenzaprine (FLEXERIL) 10 MG tablet Take 1 tablet (10 mg total) by mouth at bedtime. 06/15/23  Yes Lovorn, Aundra Millet, MD  diclofenac Sodium (VOLTAREN) 1 % GEL Apply 2 g topically 4 (four) times daily. Patient taking differently: Apply 2 g topically 4 (four) times daily as needed (pain). 05/11/23  Yes Setzer, Lynnell Jude, PA-C  fludrocortisone (FLORINEF) 0.1 MG tablet Take 2 tablets (0.2 mg total) by mouth daily. For orthostatic hypotension 06/15/23  Yes Lovorn, Aundra Millet, MD  gabapentin (NEURONTIN) 100 MG capsule Take 200 mg by mouth 2 (two) times daily.  08/16/15  Yes [provider]  glipiZIDE (GLUCOTROL) 5 MG tablet Take 10 mg by mouth daily before breakfast.   Yes [provider]  JARDIANCE 25 MG TABS tablet Take 25 mg by mouth daily.   Yes [provider]  lactase (LACTAID) 3000 units tablet Take 3,000 Units by mouth daily before breakfast.   Yes [provider]  levothyroxine (SYNTHROID) 88 MCG tablet Take 88 mcg by mouth daily before breakfast.   Yes [provider]  midodrine (PROAMATINE) 10 MG tablet Take 1.5 tablets (15 mg total) by mouth 3 (three) times daily with meals. For orthostatic hypotension 06/15/23  Yes Lovorn, Aundra Millet, MD  Multiple Vitamin (MULTIVITAMIN WITH MINERALS) TABS tablet Take 1 tablet by mouth daily. 04/22/23  Yes Sunnie Nielsen, DO  mupirocin ointment (BACTROBAN) 2 % Place 1 Application into the nose 2 (two) times daily for 60 doses. Use as directed 2 times daily for 5 days every other week for 6 weeks. 07/28/23 08/27/23 Yes Joan Flores, PA-C  pioglitazone (ACTOS) 30 MG tablet Take 30 mg by mouth daily.   Yes [provider]  Psyllium (METAMUCIL 3 IN 1 DAILY FIBER PO) Take 2 capsules by mouth daily.   Yes [provider]  rosuvastatin (CRESTOR) 5 MG tablet Take 5 mg by mouth at bedtime.   Yes [provider]  senna (SENOKOT)  8.6 MG TABS tablet Take 1 tablet (8.6 mg total) by mouth at bedtime. 05/11/23  Yes Setzer, Lynnell Jude, PA-C  Scheduled Meds:  acidophilus  2 capsule Oral TID   budesonide (PULMICORT) nebulizer solution  0.5 mg Nebulization BID   Chlorhexidine Gluconate Cloth  6 each Topical Daily   cyanocobalamin  1,000 mcg Oral Daily   docusate  100 mg Per Tube BID   guaiFENesin  5 mL Oral Q6H   insulin aspart  0-9 Units Subcutaneous Q4H   ipratropium  0.5 mg Nebulization BID   lactase  3,000 Units Oral QAC breakfast   levothyroxine  88 mcg Oral QAC breakfast   mouth rinse  15 mL Mouth Rinse 4 times per day   polyethylene glycol  17 g Per Tube Daily   rosuvastatin  5 mg Oral QHS   senna  1 tablet Oral QHS   sodium chloride flush  3-10 mL Intravenous Q12H   Continuous Infusions:  acetaminophen Stopped (08/03/23 0626)   ceFEPime (MAXIPIME) IV Stopped (08/03/23 0553)   fentaNYL infusion INTRAVENOUS Stopped (08/02/23 1146)   metronidazole 100 mL/hr at 08/03/23 0700   norepinephrine (LEVOPHED) Adult infusion Stopped (08/02/23 0601)   phenylephrine (NEO-SYNEPHRINE) Adult infusion 50 mcg/min (08/03/23 0700)   propofol (DIPRIVAN) infusion Stopped (08/02/23 1147)   vancomycin Stopped (08/02/23 1303)   PRN Meds:.alum & mag hydroxide-simeth, benzonatate, bisacodyl, fentaNYL, menthol-cetylpyridinium **OR** [DISCONTINUED] phenol, senna-docusate, sodium chloride flush  Active Hospital Problem list   See systems below  Assessment & Plan:  #Acute Hypoxic Respiratory Failure #HCAP #Post Op Atelectasis -LTVV -VAP prevention protocol -PRN &Scheduled bronchodilators -daily SAT & SBT -Wean PEEP and FiO2 for sats > 90% -Follow chest x-ray, ABG prn.  -prn fentanyl, propofol  for RASS -1    Encephalopathy   - will dc cefepime   - metabolic component - IVF with LR   - HAGMA   #Cervical Myelopathy PMHx: Cervical stenosis, Cervical Spinal Cord Injury s/p fall s/p C2 dome laminectomy, C3-6 laminoplasty on  1/21 now complicated by Epidural Hematoma s/p surgical decompression 1/26 -Neurosurgery following, apprec ~ Follow recommendation as below: -Vasopressors as needed to maintain MAP goal >85 -Hold midodrine and Florinef while on pressors -Neuro checks q2h -PT/OT once stable -Foley catheter for urinary retention -Pain management (Fentanyl while intubated)   #HCAP Met SIRS criteria: tachycardia, sob, hypoxia CT chest shows Left lower lobe pneumonia -Monitor fever curve -Trend WBC's & Procalcitonin -Follow cultures as above -Continue empiric abx ppx pending cultures & sensitivities  #Acute Blood Loss Anemia Post Op #Hx of Blood Dyscrasia -Monitor for S/Sx of bleeding -Trend CBC (H&H q6h) -SCD's for VTE Prophylaxis (chemical ppx contraindicated) -Transfuse for Hgb <7  (has received 2 units pRBC's so far on 1/25)  #AKI on CKD stage III #NAGMA -Monitor I&O's / urinary output -Follow BMP -Ensure adequate renal perfusion -Avoid nephrotoxic agents as able -Replace electrolytes as indicated  #T2DM -HgbA1c goal  -CBG's q4; Target range of 140 to 180 -SSI -Follow ICU Hypo/Hyperglycemia protocol -Hold home Glipizide    #Sedation needs in setting of mechanical ventilation -Maintain a RASS goal of 0 to -1 -Propofol and Fentanyl to maintain RASS goal -Avoid sedating medications as able -Daily wake up assessment   #Hypothyroidism -Check TSH, Free T4 -continue home synthroid  Best practice:  Diet:  NPO Pain/Anxiety/Delirium protocol (if indicated): Yes (RASS goal -1) VAP protocol (if indicated): Yes DVT prophylaxis: Contraindicated GI prophylaxis: H2B Glucose control:  SSI Yes Central venous access:  N/A Arterial line:  Yes, and it is still needed Foley:  Yes, and it is still needed Mobility:  bed rest  PT consulted: N/A Last date of multidisciplinary goals of care discussion []  Code Status:  full code Disposition: ICU   = Goals of Care = Code Status Order: FULL   Primary Emergency Contact: Okane,Timothy S, Home Phone: [phone] Wishes to pursue full aggressive treatment and intervention options, including CPR and intubation, but goals of care will be addressed on going with family if that should become necessary.  Critical  care provider statement:   Total critical care time: 33 minutes   Performed by: Karna Christmas MD   Critical care time was exclusive of separately billable procedures and treating other patients.   Critical care was necessary to treat or prevent imminent or life-threatening deterioration.   Critical care was time spent personally by me on the following activities: development of treatment plan with patient and/or surrogate as well as nursing, discussions with consultants, evaluation of patient's response to treatment, examination of patient, obtaining history from patient or surrogate, ordering and performing treatments and interventions, ordering and review of laboratory studies, ordering and review of radiographic studies, pulse oximetry and re-evaluation of patient's condition.    Vida Rigger, M.D.  Pulmonary & Critical Care Medicine

## 2023-08-03 NOTE — Plan of Care (Signed)
  Problem: Education: Goal: Ability to describe self-care measures that may prevent or decrease complications (Diabetes Survival Skills Education) will improve Outcome: Not Progressing   Problem: Coping: Goal: Ability to adjust to condition or change in health will improve Outcome: Not Progressing   Problem: Fluid Volume: Goal: Ability to maintain a balanced intake and output will improve Outcome: Not Progressing   Problem: Health Behavior/Discharge Planning: Goal: Ability to manage health-related needs will improve Outcome: Not Progressing   Problem: Metabolic: Goal: Ability to maintain appropriate glucose levels will improve Outcome: Progressing   Problem: Health Behavior/Discharge Planning: Goal: Ability to manage health-related needs will improve Outcome: Not Progressing   Problem: Education: Goal: Knowledge of General Education information will improve Description: Including pain rating scale, medication(s)/side effects and non-pharmacologic comfort measures Outcome: Not Progressing   Problem: Clinical Measurements: Goal: Ability to maintain clinical measurements within normal limits will improve Outcome: Progressing

## 2023-08-03 NOTE — Progress Notes (Signed)
Inpatient Rehab Admissions Coordinator:   Events noted. Will see how she does with therapy re-evals.    Estill Dooms, PT, DPT Admissions Coordinator [phone] 08/03/23  9:51 AM

## 2023-08-03 NOTE — Progress Notes (Signed)
Initial Nutrition Assessment  DOCUMENTATION CODES:   Non-severe (moderate) malnutrition in context of chronic illness  INTERVENTION:   Recommend TPN initiation   Recommend thiamine 100mg  IV x 5 days   Pt at high refeed risk  Daily weights   NUTRITION DIAGNOSIS:   Moderate Malnutrition related to chronic illness as evidenced by mild fat depletion, moderate fat depletion, mild muscle depletion, severe muscle depletion.  GOAL:   Patient will meet greater than or equal to 90% of their needs  MONITOR:   Labs, Weight trends, TF tolerance, I & O's, Skin, Diet advancement  REASON FOR ASSESSMENT:   Consult Enteral/tube feeding initiation and management  ASSESSMENT:   68 y/o female with h/o DM, CKD III, thyroid cancer s/p total thyroidectomy 01/2023, GERD, diverticulitis, esophageal stricture s/p dilation 04/2022 who is admitted with cervical central cord injury after a trauma last year now s/p elective posterior cervical laminoplasty C3-6 and C2 inferior dome laminectomy 1/21 complicated by HCAP, AKI and epidural hematoma s/p emergent cervical decompression and evacuation 1/26.  Visited pt's room today. Pt with AMS and is unable to provide any history. Pt remains NPO secondary to AMS. NGT unable to be placed by nursing or fluoroscopy. Pt with h/o severe esophogeal stenosis requiring dilation. Recommend TPN initiation as pt has now been without adequate nutrition for 7 days. Pt may require IR G-tube. Pt is at high refeed risk. Per chart, pt appears to be down ~ 25lbs since 2023 but has remained weight stable since October.   Medications reviewed and include: risaquad, B12, dexamethasone, colace, insulin, lactiad, synthroid, miralax, senokot, albumin, doxycycline, LRS @40ml /hr, neo-synephrine  Labs reviewed: K 4.1 wnl, BUN 50(H), creat 1.13(H) Hgb 8.3(L), Hct 25.1(L) Cbgs- 127, 114, 126 x 24 hrs AIC 5.9(H)- 04/13/23  Drains- 20ml   NUTRITION - FOCUSED PHYSICAL EXAM:  Flowsheet  Row Most Recent Value  Orbital Region Mild depletion  Upper Arm Region Moderate depletion  Thoracic and Lumbar Region No depletion  Buccal Region Mild depletion  Temple Region Mild depletion  Clavicle Bone Region Mild depletion  Clavicle and Acromion Bone Region Mild depletion  Scapular Bone Region No depletion  Dorsal Hand No depletion  Patellar Region Severe depletion  Anterior Thigh Region Severe depletion  Posterior Calf Region Severe depletion  Edema (RD Assessment) Mild  Hair Reviewed  Eyes Reviewed  Mouth Reviewed  Skin Reviewed  Nails Reviewed   Diet Order:   Diet Order     None      EDUCATION NEEDS:   Not appropriate for education at this time  Skin:  Skin Assessment: Reviewed RN Assessment (incision back and neck)  Last BM:  pta  Height:   Ht Readings from Last 1 Encounters:  07/28/23 5\' 7"  (1.702 m)    Weight:   Wt Readings from Last 1 Encounters:  07/28/23 58.9 kg    Ideal Body Weight:  61.36 kg  BMI:  Body mass index is 20.34 kg/m.  Estimated Nutritional Needs:   Kcal:  1800-2100kcal/day  Protein:  90-105g/day  Fluid:  1.8-2.1L/day  Betsey Holiday MS, RD, LDN If unable to be reached, please send secure chat to "RD inpatient" available from 8:00a-4:00p daily

## 2023-08-03 NOTE — TOC Progression Note (Signed)
Transition of Care [facility]) - Progression Note    Patient Details  Name: [name] MRN: [id number] Date of Birth: 1955/08/18  Transition of Care [facility]) CM/SW Contact  Margarito Liner, LCSW Phone Number: 08/03/2023, 2:36 PM  Clinical Narrative: CSW continues to follow progress. Patient will need new PT and OT consults when medically appropriate.    Expected Discharge Plan: Skilled Nursing Facility Barriers to Discharge: SNF Pending bed offer, Insurance Authorization  Expected Discharge Plan and Services   Discharge Planning Services: CM Consult   Living arrangements for the past 2 months: Single Family Home                   DME Agency: NA       HH Arranged: NA           Social Determinants of Health (SDOH) Interventions SDOH Screenings   Food Insecurity: No Food Insecurity (07/28/2023)  Housing: Low Risk  (07/28/2023)  Transportation Needs: No Transportation Needs (07/28/2023)  Utilities: Not At Risk (07/28/2023)  Depression (PHQ2-9): Low Risk  (06/15/2023)  Social Connections: Moderately Integrated (07/28/2023)  Tobacco Use: Low Risk  (07/28/2023)    Readmission Risk Interventions     No data to display

## 2023-08-03 NOTE — Progress Notes (Signed)
SLP Cancellation Note  Patient Details Name: [name] MRN: [id number] DOB: Jul 03, 1956   Cancelled treatment:       Reason Eval/Treat Not Completed: Medical issues which prohibited therapy;Patient's level of consciousness. Pt extubated on 1/26, now on nasal canula. Pt drowsy, resting with family in room. Speech therapy session held secondary to drowsy state increasing risk of aspiration and concern for esophageal dysfunction. SLP spoke to MD and RN regarding recommendations for a barium swallow assessment to assess esophagus prior to re-initiation of PO diet. Any aspiration events could also be reported during assessment. SLP will continue to follow.  [place] Amine Adelson Clapp  MS [facility] SLP    [place] J Clapp 08/03/2023, 10:52 AM

## 2023-08-04 ENCOUNTER — Other Ambulatory Visit: Payer: Self-pay

## 2023-08-04 ENCOUNTER — Inpatient Hospital Stay: Payer: Medicare Other

## 2023-08-04 DIAGNOSIS — T148XXA Other injury of unspecified body region, initial encounter: Secondary | ICD-10-CM

## 2023-08-04 DIAGNOSIS — M4714 Other spondylosis with myelopathy, thoracic region: Secondary | ICD-10-CM

## 2023-08-04 DIAGNOSIS — M4804 Spinal stenosis, thoracic region: Secondary | ICD-10-CM

## 2023-08-04 LAB — CBC
HCT: 23.4 % — ABNORMAL LOW (ref 36.0–46.0)
Hemoglobin: 7.6 g/dL — ABNORMAL LOW (ref 12.0–15.0)
MCH: 28.9 pg (ref 26.0–34.0)
MCHC: 32.5 g/dL (ref 30.0–36.0)
MCV: 89 fL (ref 80.0–100.0)
Platelets: 238 10*3/uL (ref 150–400)
RBC: 2.63 MIL/uL — ABNORMAL LOW (ref 3.87–5.11)
RDW: 14.7 % (ref 11.5–15.5)
WBC: 10.1 10*3/uL (ref 4.0–10.5)
nRBC: 0.6 % — ABNORMAL HIGH (ref 0.0–0.2)

## 2023-08-04 LAB — COMPREHENSIVE METABOLIC PANEL
ALT: 15 U/L (ref 0–44)
AST: 21 U/L (ref 15–41)
Albumin: 2.8 g/dL — ABNORMAL LOW (ref 3.5–5.0)
Alkaline Phosphatase: 51 U/L (ref 38–126)
Anion gap: 14 (ref 5–15)
BUN: 50 mg/dL — ABNORMAL HIGH (ref 8–23)
CO2: 15 mmol/L — ABNORMAL LOW (ref 22–32)
Calcium: 8.3 mg/dL — ABNORMAL LOW (ref 8.9–10.3)
Chloride: 119 mmol/L — ABNORMAL HIGH (ref 98–111)
Creatinine, Ser: 1.06 mg/dL — ABNORMAL HIGH (ref 0.44–1.00)
GFR, Estimated: 58 mL/min — ABNORMAL LOW (ref >60)
Glucose, Bld: 144 mg/dL — ABNORMAL HIGH (ref 70–99)
Potassium: 4.2 mmol/L (ref 3.5–5.1)
Sodium: 148 mmol/L — ABNORMAL HIGH (ref 135–145)
Total Bilirubin: 1.5 mg/dL — ABNORMAL HIGH (ref 0.0–1.2)
Total Protein: 6 g/dL — ABNORMAL LOW (ref 6.5–8.1)

## 2023-08-04 LAB — GLUCOSE, CAPILLARY
Glucose-Capillary: 105 mg/dL — ABNORMAL HIGH (ref 70–99)
Glucose-Capillary: 124 mg/dL — ABNORMAL HIGH (ref 70–99)
Glucose-Capillary: 134 mg/dL — ABNORMAL HIGH (ref 70–99)
Glucose-Capillary: 136 mg/dL — ABNORMAL HIGH (ref 70–99)
Glucose-Capillary: 149 mg/dL — ABNORMAL HIGH (ref 70–99)
Glucose-Capillary: 178 mg/dL — ABNORMAL HIGH (ref 70–99)

## 2023-08-04 LAB — MAGNESIUM: Magnesium: 2.3 mg/dL (ref 1.7–2.4)

## 2023-08-04 LAB — PHOSPHORUS: Phosphorus: 2.6 mg/dL (ref 2.5–4.6)

## 2023-08-04 MED ORDER — TRACE MINERALS CU-MN-SE-ZN 300-55-60-3000 MCG/ML IV SOLN
INTRAVENOUS | Status: AC
  Filled 2023-08-04: qty 2000

## 2023-08-04 MED ORDER — FAT EMUL FISH OIL/PLANT BASED 20% (SMOFLIPID)IV EMUL
250.0000 mL | INTRAVENOUS | Status: AC
  Administered 2023-08-04: 250 mL via INTRAVENOUS
  Filled 2023-08-04: qty 250

## 2023-08-04 MED ORDER — SODIUM CHLORIDE 0.9% FLUSH
10.0000 mL | INTRAVENOUS | Status: DC | PRN
Start: 2023-08-04 — End: 2023-08-21

## 2023-08-04 MED ORDER — SODIUM CHLORIDE 0.9% FLUSH
10.0000 mL | Freq: Two times a day (BID) | INTRAVENOUS | Status: DC
  Administered 2023-08-04 – 2023-08-06 (×3): 10 mL
  Administered 2023-08-07: 20 mL
  Administered 2023-08-07 – 2023-08-12 (×9): 10 mL
  Administered 2023-08-13: 20 mL
  Administered 2023-08-13: 10 mL
  Administered 2023-08-14: 30 mL
  Administered 2023-08-14 – 2023-08-15 (×2): 10 mL
  Administered 2023-08-15: 30 mL
  Administered 2023-08-16: 10 mL
  Administered 2023-08-16: 30 mL
  Administered 2023-08-17 – 2023-08-21 (×8): 10 mL

## 2023-08-04 NOTE — Progress Notes (Signed)
Peripherally Inserted Central Catheter Placement  The IV Nurse has discussed with the patient and/or persons authorized to consent for the patient, the purpose of this procedure and the potential benefits and risks involved with this procedure.  The benefits include less needle sticks, lab draws from the catheter, and the patient may be discharged home with the catheter. Risks include, but not limited to, infection, bleeding, blood clot (thrombus formation), and puncture of an artery; nerve damage and irregular heartbeat and possibility to perform a PICC exchange if needed/ordered by physician.  Alternatives to this procedure were also discussed.  Bard Power PICC patient education guide, fact sheet on infection prevention and patient information card has been provided to patient /or left at bedside.    PICC Placement Documentation  PICC Triple Lumen 08/04/23 Right Basilic 38 cm 0 cm (Active)  Indication for Insertion or Continuance of Line Administration of hyperosmolar/irritating solutions (i.e. TPN, Vancomycin, etc.) 08/04/23 1833  Exposed Catheter (cm) 0 cm 08/04/23 1833  Site Assessment Clean, Dry, Intact 08/04/23 1833  Lumen #1 Status Flushed;Saline locked;Blood return noted 08/04/23 1833  Lumen #2 Status Flushed;Saline locked;Blood return noted 08/04/23 1833  Lumen #3 Status Flushed;Saline locked;Blood return noted 08/04/23 1833  Dressing Type Transparent;Securing device 08/04/23 1833  Dressing Status Antimicrobial disc/dressing in place;Clean, Dry, Intact 08/04/23 1833  Line Care Connections checked and tightened 08/04/23 1833  Line Adjustment (NICU/IV Team Only) No 08/04/23 1833  Dressing Intervention New dressing;Adhesive placed at insertion site (IV team only);Other (Comment) 08/04/23 1833  Dressing Change Due 08/11/23 08/04/23 1833       Annett Fabian 08/04/2023, 6:35 PM

## 2023-08-04 NOTE — Progress Notes (Signed)
NAME:  [name], MRN:  [id number], DOB:  October 01, 1955, LOS: 7 ADMISSION DATE:  07/28/2023, CONSULTATION DATE:  08/02/23 REFERRING MD:  Ernestine Mcmurray CHIEF COMPLAINT:  cervical myelopathy  HPI  [name] is a 68 year old old female with PMHx of hypothyroidism, insulin-dependent DM, CKD stage III, dysphagia with esophageal strictures, facial cellulitis, GERD, neuropathy, arthritis, blood dyscrasia, and recent traumatic cervical central cord injury who presented to the hospital for elective cervical spine surgery.  Per patient's chart, patient was seen by neurosurgery on 07/28/2023 for follow-up after suffering a fall 3 months ago in which she sustained a cervical spinal cord injury.  Per patient, they deferred emergent surgical depression and wanted to let her have time to recover and work with physical therapy.  She continued to have hand weakness as well as lower extremity weakness and ambulatory issues.  Given the ongoing compression of C3-C6, laminoplasty and C2 dome laminectomy was indicated.    Hospital course: Patient underwent C3-6 laminoplasty and C2 dome and laminectomy surgery on 07/29/2023 with drain placement.   Pertinent Labs/Diagnostics Findings: Na+/ K+: 141/4.4 Glucose: 136 BUN/Cr.47/1.42 WBC: 5.8 K/L  Hgb/Hct: 7.4/22.4  Past Medical History  Hypothyroidism, insulin-dependent DM, CKD stage III, dysphagia with esophageal strictures, facial cellulitis, GERD, neuropathy, arthritis, blood dyscrasia, and recent traumatic cervical central cord injury  Significant Hospital Events   01/21: Patient underwent posterior C3-6 Cervical Laminoplasty with C2 inferior dome Laminectomy 01/22: POD #1. Pt. stable with no acute changes.  Working with PT and OT 01/23: POD #2.  Pt. noted to be very lethargic and difficult to arouse 01/24: POD #3. Pt. developed hypoxia tachycardia and shortness of breath.  Hospitalist consulted CT chest obtained and showed possible pneumonia.  Started on IV  antibiotics and DuoNebs. 01/25: POD #4. Pt. with new concerns of not moving the left arm.  CT head and cervical spine obtained with no new concerns.  Follow-up MRI however demonstrated epidural hematoma extending from the cervical spine to the lower thoracic spine with acute compression and T2 signal.  Taken emergently to the OR for surgical decompression 01/26: Pt. transferred to the ICU s/p emergent cervical decompression and evacuation of epidural hematoma.  Remained intubated. PCCM consulted 08/03/23- s/p surgery overnight, alert to self but drowsy with mild AKI and metabolic acidosis.  Lethargy noted have dcd cefepime and started doxycycline instead.  08/04/23- patient more alert able to wiggle toes to verbal.  Remains critically ill.    Consults:  PCCM Neurosurgery Hospitalist  Procedures:  1/21:posterior C3-6 Cervical Laminoplasty with C2 inferior dome Laminectomy 1/25: s/p Epidural Hematoma Decompression 1/25: Intubation 1/25: Arterial Line  Significant Diagnostic Tests:  1/25:MRI  Thoracic and Cervical Spine> IMPRESSION: 1. Postoperative changes from recent C3 through C6 laminectomy with laminoplasty. Associated fractures better appreciated on prior CT. 2. Dorsal epidural collection extending from C2-3 through T9, likely epidural hematoma. Resultant severe diffuse spinal stenoses stenosis with cord compression at these levels. 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 Emergent neuro surgical 3. Underlying multilevel cervical spondylosis with resultant multilevel foraminal narrowing as above. Notable findings include severe left C4 foraminal stenosis, moderate bilateral C5 foraminal narrowing, with mild bilateral C6 and right C7 foraminal stenosis.  1/25: Noncontrast CT Head &Cervical Spine> IMPRESSION: 1. No acute intracranial abnormality. 2. Mild cortical atrophy, within normal limits for  patient age. 3. Improved paranasal sinus mucosal opacification as described above.  1/24: CTA Chest> IMPRESSION: Left lower lobe pneumonia without sizable  effusion.   Micro Data:  1/24: Blood culture x2> 1/24: MRSA PCR>> positive 1/24: Strep pneumo urinary antigen>  Antimicrobials:  1/24 Vancomycin >>  1/24 Cefepime >>  OBJECTIVE  Blood pressure (!) 147/59, pulse 84, temperature 98.6 F (37 C), resp. rate (!) 28, height 5\' 7"  (1.702 m), weight 59.8 kg, SpO2 99%.      Intake/Output Summary (Last 24 hours) at 08/04/2023 1029 Last data filed at 08/04/2023 0700 Gross per 24 hour  Intake 1712.12 ml  Output 2780 ml  Net -1067.88 ml   Filed Weights   07/28/23 0615 08/04/23 0500  Weight: 58.9 kg 59.8 kg   Physical Examination  GENERAL: lying down in bed, no apparent distress -encephalopathy noted CVS: RRR, no carotid bruit. LUNGS: decreased air entry on left with rhonchi ABDOMEN: Soft, NTTP EXTREMITIES: no edema or cyanosis NEURO: MENTAL STATUS: sedated on propofol LANG/SPEECH: non-verbal (sedated) CRANIAL NERVES: Pupils are equal and reactive, face symmetric - poor cough and gag to suctioning, rest of cranial nerves were deferred due to sedation. MOTOR: no spontaneous movements - no withdrawal to pain on either side (sedated) REFLEXES: hyporeflexic bilaterally SENSORY: no reaction to pain in both sides GAIT: deferred - sedated SKIN: intact except as below:      Labs/imaging that I havepersonally reviewed  (right click and "Reselect all SmartList Selections" daily)     Labs   CBC: Recent Labs  Lab 08/01/23 0423 08/01/23 2005 08/02/23 0315 08/03/23 0450 08/04/23 0401  WBC 6.0 5.8 5.2 9.8 10.1  HGB 8.1* 7.4* 8.7* 8.3* 7.6*  HCT 24.3* 22.4* 26.4* 25.1* 23.4*  MCV 87.4 89.2 90.4 88.4 89.0  PLT 173 174 131* 216 238    Basic Metabolic Panel: Recent Labs  Lab 07/30/23 2136 08/01/23 0423 08/02/23 0315 08/03/23 0450 08/04/23 0401  NA 140 141 138 142 148*   K 4.8 4.4 5.1 4.1 4.2  CL 104 108 107 113* 119*  CO2 23 20* 16* 17* 15*  GLUCOSE 86 136* 193* 114* 144*  BUN 38* 47* 44* 50* 50*  CREATININE 1.29* 1.42* 1.08* 1.13* 1.06*  CALCIUM 9.5 9.0 8.0* 7.9* 8.3*  MG  --   --   --   --  2.3  PHOS  --   --   --   --  2.6   GFR: Estimated Creatinine Clearance: 50 mL/min (A) (by C-G formula based on SCr of 1.06 mg/dL (H)). Recent Labs  Lab 07/31/23 1457 07/31/23 1731 08/01/23 0423 08/01/23 2005 08/02/23 0315 08/03/23 0450 08/03/23 1307 08/04/23 0401  WBC 6.7  --    < > 5.8 5.2 9.8  --  10.1  LATICACIDVEN 1.2 1.8  --   --  1.8  --  0.9  --    < > = values in this interval not displayed.    Liver Function Tests: Recent Labs  Lab 08/02/23 0315 08/03/23 0450 08/04/23 0401  AST 26 22 21   ALT 13 16 15   ALKPHOS 48 51 51  BILITOT 1.0 0.9 1.5*  PROT 5.8* 5.8* 6.0*  ALBUMIN 2.7* 2.6* 2.8*   No results for input(s): "LIPASE", "AMYLASE" in the last 168 hours. No results for input(s): "AMMONIA" in the last 168 hours.  ABG    Component Value Date/Time   PHART 7.35 08/03/2023 1502   PCO2ART 29 (L) 08/03/2023 1502   PO2ART 66 (L) 08/03/2023 1502   HCO3 16.0 (L) 08/03/2023 1502   ACIDBASEDEF 8.2 (H) 08/03/2023 1502   O2SAT 92.5 08/03/2023 1502  Coagulation Profile: Recent Labs  Lab 08/01/23 2005  INR 1.3*    Cardiac Enzymes: No results for input(s): "CKTOTAL", "CKMB", "CKMBINDEX", "TROPONINI" in the last 168 hours.  HbA1C: Hemoglobin A1C  Date/Time Value Ref Range Status  02/20/2013 04:02 AM 10.3 (H) 4.2 - 6.3 % Final    Comment:    The American Diabetes Association recommends that a primary goal of therapy should be <7% and that physicians should reevaluate the treatment regimen in patients with HbA1c values consistently >8%.    Hgb A1c MFr Bld  Date/Time Value Ref Range Status  04/13/2023 11:34 AM 5.9 (H) 4.8 - 5.6 % Final    Comment:    (NOTE) Pre diabetes:          5.7%-6.4%  Diabetes:               >6.4%  Glycemic control for   <7.0% adults with diabetes   08/11/2021 08:39 PM 7.2 (H) 4.8 - 5.6 % Final    Comment:    (NOTE) Pre diabetes:          5.7%-6.4%  Diabetes:              >6.4%  Glycemic control for   <7.0% adults with diabetes    CBG: Recent Labs  Lab 08/03/23 1335 08/03/23 2007 08/03/23 2259 08/04/23 0357 08/04/23 0731  GLUCAP 127* 164* 94 134* 105*    Review of Systems:   Unable to be obtained secondary to the patient's intubated and sedated status.   Past Medical History  She,  has a past medical history of Acute incomplete quadriplegia (HCC), Acute respiratory failure with hypoxia (HCC), AKI (acute kidney injury) (HCC), Arthritis, Blood dyscrasia, Central cord syndrome (HCC), Dental caries, Diabetes mellitus without complication (HCC), Diverticulosis, Dysphagia, Esophageal stricture, Facial cellulitis, GERD (gastroesophageal reflux disease), Hypothyroidism, Neuropathy, Odynophagia, Pelvic fracture (HCC), Renal insufficiency, Spinal cord compression, post-traumatic (HCC), Spinal cord injury, cervical region (HCC), and Stage 3a chronic kidney disease (CKD) (HCC).   Surgical History    Past Surgical History:  Procedure Laterality Date   ABDOMINAL HYSTERECTOMY     BILATERAL SALPINGECTOMY Bilateral 03/16/2017   Procedure: BILATERAL SALPINGECTOMY;  Surgeon: Schermerhorn, Ihor Austin, MD;  Location: [facility] ORS;  Service: Gynecology;  Laterality: Bilateral;   BIOPSY  05/03/2023   Procedure: BIOPSY;  Surgeon: Benancio Deeds, MD;  Location: [facility] ENDOSCOPY;  Service: Gastroenterology;;   COLONOSCOPY WITH PROPOFOL N/A 08/18/2018   Procedure: COLONOSCOPY WITH PROPOFOL;  Surgeon: Scot Jun, MD;  Location: [facility] ENDOSCOPY;  Service: Endoscopy;  Laterality: N/A;   CYSTOCELE REPAIR N/A 03/16/2017   Procedure: ANTERIOR REPAIR (CYSTOCELE);  Surgeon: Schermerhorn, Ihor Austin, MD;  Location: [facility] ORS;  Service: Gynecology;  Laterality: N/A;   ESOPHAGOGASTRODUODENOSCOPY  (EGD) WITH PROPOFOL N/A 08/18/2018   Procedure: ESOPHAGOGASTRODUODENOSCOPY (EGD) WITH PROPOFOL;  Surgeon: Scot Jun, MD;  Location: [facility] ENDOSCOPY;  Service: Endoscopy;  Laterality: N/A;   ESOPHAGOGASTRODUODENOSCOPY (EGD) WITH PROPOFOL Left 05/01/2023   Procedure: ESOPHAGOGASTRODUODENOSCOPY (EGD) WITH PROPOFOL;  Surgeon: Benancio Deeds, MD;  Location: [facility] ENDOSCOPY;  Service: Gastroenterology;  Laterality: Left;   ESOPHAGOGASTRODUODENOSCOPY (EGD) WITH PROPOFOL N/A 05/03/2023   Procedure: ESOPHAGOGASTRODUODENOSCOPY (EGD) WITH PROPOFOL;  Surgeon: Benancio Deeds, MD;  Location: [facility] ENDOSCOPY;  Service: Gastroenterology;  Laterality: N/A;   POSTERIOR CERVICAL LAMINECTOMY N/A 07/28/2023   Procedure: C2 DOME LAMINECTOMY;  Surgeon: [name], MD;  Location: [facility] ORS;  Service: Neurosurgery;  Laterality: N/A;   POSTERIOR CERVICAL LAMINECTOMY N/A 08/01/2023   Procedure: POSTERIOR  CERVICAL LAMINECTOMY;  Surgeon: [name], MD;  Location: [facility] ORS;  Service: Neurosurgery;  Laterality: N/A;   TEAR DUCT PROBING Right    TUBAL LIGATION     VAGINAL HYSTERECTOMY N/A 03/16/2017   Procedure: HYSTERECTOMY VAGINAL;  Surgeon: Schermerhorn, Ihor Austin, MD;  Location: [facility] ORS;  Service: Gynecology;  Laterality: N/A;   WISDOM TOOTH EXTRACTION       Social History   reports that she has never smoked. She has never used smokeless tobacco. She reports that she does not drink alcohol and does not use drugs.   Family History   Her family history includes Breast cancer in her maternal aunt; Diabetes in her father; Hypertension in her father and mother.   Allergies Allergies  Allergen Reactions   Amoxicillin-Pot Clavulanate Itching and Anaphylaxis   Ampicillin Rash     Home Medications  Prior to Admission medications   Medication Sig Start Date End Date Taking? Authorizing Provider  acetaminophen (TYLENOL) 650 MG CR tablet Take 650 mg by mouth every 8 (eight) hours as needed for pain.    Yes [provider]  chlorhexidine (HIBICLENS) 4 % external liquid Apply 15 mLs (1 Application total) topically as directed for 30 doses. Use as directed daily for 5 days every other week for 6 weeks. 07/28/23  Yes Joan Flores, PA-C  cyanocobalamin (VITAMIN B12) 1000 MCG tablet Take 1,000 mcg by mouth daily.   Yes [provider]  cyclobenzaprine (FLEXERIL) 10 MG tablet Take 1 tablet (10 mg total) by mouth at bedtime. 06/15/23  Yes Lovorn, Aundra Millet, MD  diclofenac Sodium (VOLTAREN) 1 % GEL Apply 2 g topically 4 (four) times daily. Patient taking differently: Apply 2 g topically 4 (four) times daily as needed (pain). 05/11/23  Yes Setzer, Lynnell Jude, PA-C  fludrocortisone (FLORINEF) 0.1 MG tablet Take 2 tablets (0.2 mg total) by mouth daily. For orthostatic hypotension 06/15/23  Yes Lovorn, Aundra Millet, MD  gabapentin (NEURONTIN) 100 MG capsule Take 200 mg by mouth 2 (two) times daily.  08/16/15  Yes [provider]  glipiZIDE (GLUCOTROL) 5 MG tablet Take 10 mg by mouth daily before breakfast.   Yes [provider]  JARDIANCE 25 MG TABS tablet Take 25 mg by mouth daily.   Yes [provider]  lactase (LACTAID) 3000 units tablet Take 3,000 Units by mouth daily before breakfast.   Yes [provider]  levothyroxine (SYNTHROID) 88 MCG tablet Take 88 mcg by mouth daily before breakfast.   Yes [provider]  midodrine (PROAMATINE) 10 MG tablet Take 1.5 tablets (15 mg total) by mouth 3 (three) times daily with meals. For orthostatic hypotension 06/15/23  Yes Lovorn, Aundra Millet, MD  Multiple Vitamin (MULTIVITAMIN WITH MINERALS) TABS tablet Take 1 tablet by mouth daily. 04/22/23  Yes Sunnie Nielsen, DO  mupirocin ointment (BACTROBAN) 2 % Place 1 Application into the nose 2 (two) times daily for 60 doses. Use as directed 2 times daily for 5 days every other week for 6 weeks. 07/28/23 08/27/23 Yes Joan Flores, PA-C  pioglitazone (ACTOS) 30 MG tablet Take 30 mg  by mouth daily.   Yes [provider]  Psyllium (METAMUCIL 3 IN 1 DAILY FIBER PO) Take 2 capsules by mouth daily.   Yes [provider]  rosuvastatin (CRESTOR) 5 MG tablet Take 5 mg by mouth at bedtime.   Yes [provider]  senna (SENOKOT) 8.6 MG TABS tablet Take 1 tablet (8.6 mg total) by mouth at bedtime. 05/11/23  Yes Wendi Maya  J, PA-C  Scheduled Meds:  acidophilus  2 capsule Oral TID   Chlorhexidine Gluconate Cloth  6 each Topical Daily   cyanocobalamin  1,000 mcg Oral Daily   dexamethasone (DECADRON) injection  4 mg Intravenous Q24H   docusate  100 mg Per Tube BID   insulin aspart  0-9 Units Subcutaneous Q4H   ipratropium  0.5 mg Nebulization BID   lactase  3,000 Units Oral QAC breakfast   levothyroxine  88 mcg Oral QAC breakfast   mouth rinse  15 mL Mouth Rinse 4 times per day   polyethylene glycol  17 g Per Tube Daily   rosuvastatin  5 mg Oral QHS   senna  1 tablet Oral QHS   sodium chloride flush  3-10 mL Intravenous Q12H   Continuous Infusions:  albumin human 12.5 g (08/04/23 1026)   doxycycline (VIBRAMYCIN) IV 125 mL/hr at 08/04/23 0700   lactated ringers 40 mL/hr at 08/04/23 0700   phenylephrine (NEO-SYNEPHRINE) Adult infusion 30 mcg/min (08/04/23 0700)   PRN Meds:.alum & mag hydroxide-simeth, benzonatate, bisacodyl, menthol-cetylpyridinium **OR** [DISCONTINUED] phenol, senna-docusate, sodium chloride flush  Active Hospital Problem list   See systems below  Assessment & Plan:  #Acute Hypoxic Respiratory Failure #HCAP #Post Op Atelectasis -LTVV -VAP prevention protocol -PRN &Scheduled bronchodilators -daily SAT & SBT -Wean PEEP and FiO2 for sats > 90% -Follow chest x-ray, ABG prn.  -prn fentanyl, propofol  for RASS -1    Encephalopathy   - will dc cefepime   - metabolic component - IVF with LR   - HAGMA   #Cervical Myelopathy PMHx: Cervical stenosis, Cervical Spinal Cord Injury s/p fall s/p C2 dome laminectomy, C3-6  laminoplasty on 1/21 now complicated by Epidural Hematoma s/p surgical decompression 1/26 -Neurosurgery following, apprec ~ Follow recommendation as below: -Vasopressors as needed to maintain MAP goal >85 -Hold midodrine and Florinef while on pressors -Neuro checks q2h -PT/OT once stable -Foley catheter for urinary retention -Pain management (Fentanyl while intubated)   #HCAP Met SIRS criteria: tachycardia, sob, hypoxia CT chest shows Left lower lobe pneumonia -Monitor fever curve -Trend WBC's & Procalcitonin -Follow cultures as above -Continue empiric abx ppx pending cultures & sensitivities  #Acute Blood Loss Anemia Post Op #Hx of Blood Dyscrasia -Monitor for S/Sx of bleeding -Trend CBC (H&H q6h) -SCD's for VTE Prophylaxis (chemical ppx contraindicated) -Transfuse for Hgb <7  (has received 2 units pRBC's so far on 1/25)  #AKI on CKD stage III #NAGMA -Monitor I&O's / urinary output -Follow BMP -Ensure adequate renal perfusion -Avoid nephrotoxic agents as able -Replace electrolytes as indicated  #T2DM -HgbA1c goal  -CBG's q4; Target range of 140 to 180 -SSI -Follow ICU Hypo/Hyperglycemia protocol -Hold home Glipizide    #Sedation needs in setting of mechanical ventilation -Maintain a RASS goal of 0 to -1 -Propofol and Fentanyl to maintain RASS goal -Avoid sedating medications as able -Daily wake up assessment   #Hypothyroidism -Check TSH, Free T4 -continue home synthroid  Best practice:  Diet:  NPO Pain/Anxiety/Delirium protocol (if indicated): Yes (RASS goal -1) VAP protocol (if indicated): Yes DVT prophylaxis: Contraindicated GI prophylaxis: H2B Glucose control:  SSI Yes Central venous access:  N/A Arterial line:  Yes, and it is still needed Foley:  Yes, and it is still needed Mobility:  bed rest  PT consulted: N/A Last date of multidisciplinary goals of care discussion []  Code Status:  full code Disposition: ICU   = Goals of Care = Code Status  Order: FULL  Primary Emergency Contact: Molock,Timothy  Kathie Rhodes, Home Phone: [phone] Wishes to pursue full aggressive treatment and intervention options, including CPR and intubation, but goals of care will be addressed on going with family if that should become necessary.  Critical care provider statement:   Total critical care time: 33 minutes   Performed by: Karna Christmas MD   Critical care time was exclusive of separately billable procedures and treating other patients.   Critical care was necessary to treat or prevent imminent or life-threatening deterioration.   Critical care was time spent personally by me on the following activities: development of treatment plan with patient and/or surrogate as well as nursing, discussions with consultants, evaluation of patient's response to treatment, examination of patient, obtaining history from patient or surrogate, ordering and performing treatments and interventions, ordering and review of laboratory studies, ordering and review of radiographic studies, pulse oximetry and re-evaluation of patient's condition.    Vida Rigger, M.D.  Pulmonary & Critical Care Medicine

## 2023-08-04 NOTE — Progress Notes (Signed)
Neurosurgery Progress Note  History: Ayane Delancey is here for C2 dome laminectomy, C3-6 laminoplasty and now s/p evacuation hematoma  POD 7/2: Pt is very drowsy, but able to follow some commands  POD6/2: Pt is drowsy this morning asking "what do you want" when awaken  Interval POD5: Ms. Tufte had a decline yesterday with her left upper extremity and then bilateral lower extremities.  CT scan was unremarkable MRI did show a large epidural hematoma.  She was taken emergently to the OR by Dr. Katrinka Blazing for decompression.  She has been monitored in the ICU overnight. She is still intubated and on sedation. She is on pressors to maintain [facility]   Hospital Course POD4: Ms Morrill is more awake today. She states she isnt having a lot of pain but she is thirsty.  POD3:Patient more alert this AM. However; hypoxia, tachycardia, SOB, low energy.  POD2: Very lethargic and difficult to arouse, states in quite a bit of pain. Medicine had been held due to sleepiness.    Physical Exam: Vitals:   08/04/23 0900 08/04/23 1000  BP: (!) 151/62 (!) 147/59  Pulse: 82 84  Resp: (!) 26 (!) 28  Temp: 98.1 F (36.7 C) 98.6 F (37 C)  SpO2: 99% 99%    Drowsy but arouses briefly on exam. Oriented to person and place.  Patient able to demonstrate AROM of toes and move ankles slightly in BLE.  No movement in LUE and localizes with RUE HV output 10 overnight  Data:  Output by Drain (mL) 08/02/23 0701 - 08/02/23 1900 08/02/23 1901 - 08/03/23 0700 08/03/23 0701 - 08/03/23 1900 08/03/23 1901 - 08/04/23 0700 08/04/23 0701 - 08/04/23 1035  Closed System Drain Right;Superior;Medial Back 20  30 10       Assessment/Plan:  [name] POD6 s/p C2 dome laminectomy, C3-6 laminoplasty  On 1/21 with return to OR on 1/25 for cervical/thoracic laminectomy and epidural hematoma evacuation  -Continue to monitor in ICU, recommend continued maintenance of MAP pressures above 80 mmHg x 3 days total. - pain control  continue with IV and oral medications - Continue to hold DVT prophylaxis  - PT/OT when able - will continue HV at this time.  - Unfortunately, attempted placement of NG tube yesterday was unsuccessful and as a result was removed. Plan for PICC placement and TPN today. - pneumonia - diagnosed 1/24 via CT, started on broad spectrum antibiotics. 1/27 CXR showing complete opacification of the left hemithorax with volume loss - Hem consult placed as patient has a history of delayed bleeding after surgery - CC and IM assisting with non surgical management. We appreciate your assistance.    Joan Flores PA-C Department of Neurosurgery

## 2023-08-04 NOTE — Progress Notes (Addendum)
PHARMACY - TOTAL PARENTERAL NUTRITION CONSULT NOTE   Indication: Unable to place NG tube for enteral nutrition   Patient Measurements: Height: 5\' 7"  (170.2 cm) Weight: 59.8 kg (131 lb 13.4 oz) IBW/kg (Calculated) : 61.6 TPN AdjBW (KG): 58.9 Body mass index is 20.65 kg/m.  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   Glucose / Insulin:  --BG 94 - 164 (SSI required 4u/24h) --on dexamethasone 4 mg IV once daily Electrolytes: hypernatremia Renal: SCr 1.42 > 1.06 Hepatic: LFTs wnl, bilirubin trending up Intake / Output; net (+) 3.5L MIVF: none GI Imaging: none recent GI Surgeries / Procedures: none recent  Central access: 08/04/23 (pending) TPN start date: 08/04/23 (pending)  RD Assessment: Estimated Needs Total Energy Estimated Needs: 1800-2100kcal/day Total Protein Estimated Needs: 90-105g/day Total Fluid Estimated Needs: 1.8-2.1L/day  Current Nutrition:  none  Plan:  ---Start E8/10 TPN at 2mL/hr + SMOF lipids 250 mL over 12 hours beginning at 1800 ---Electrolytes in TPN: Na 38mEq/L, K 67mEq/L, Ca 4.6mEq/L, Mg 18mEq/L, and Phos 53mmol/L. Cl:Ac 0.49 ---Add standard MVI, thiamine 100mg  daily x 5 days (day#1) and trace elements to TPN ---continue Sensitive q4h SSI and adjust as needed  Monitor TPN labs on Mon/Thurs, daily until stable  Lowella Bandy 08/04/2023,10:28 AM

## 2023-08-05 DIAGNOSIS — G959 Disease of spinal cord, unspecified: Secondary | ICD-10-CM | POA: Diagnosis not present

## 2023-08-05 DIAGNOSIS — Z7189 Other specified counseling: Secondary | ICD-10-CM | POA: Diagnosis not present

## 2023-08-05 LAB — RETICULOCYTES
Immature Retic Fract: 43 % — ABNORMAL HIGH (ref 2.3–15.9)
RBC.: 2.17 MIL/uL — ABNORMAL LOW (ref 3.87–5.11)
Retic Count, Absolute: 17.1 10*3/uL — ABNORMAL LOW (ref 19.0–186.0)
Retic Ct Pct: 0.8 % (ref 0.4–3.1)

## 2023-08-05 LAB — GLUCOSE, CAPILLARY
Glucose-Capillary: 159 mg/dL — ABNORMAL HIGH (ref 70–99)
Glucose-Capillary: 164 mg/dL — ABNORMAL HIGH (ref 70–99)
Glucose-Capillary: 166 mg/dL — ABNORMAL HIGH (ref 70–99)
Glucose-Capillary: 175 mg/dL — ABNORMAL HIGH (ref 70–99)
Glucose-Capillary: 198 mg/dL — ABNORMAL HIGH (ref 70–99)
Glucose-Capillary: 220 mg/dL — ABNORMAL HIGH (ref 70–99)

## 2023-08-05 LAB — COMPREHENSIVE METABOLIC PANEL
ALT: 15 U/L (ref 0–44)
AST: 19 U/L (ref 15–41)
Albumin: 2.9 g/dL — ABNORMAL LOW (ref 3.5–5.0)
Alkaline Phosphatase: 46 U/L (ref 38–126)
Anion gap: 10 (ref 5–15)
BUN: 57 mg/dL — ABNORMAL HIGH (ref 8–23)
CO2: 18 mmol/L — ABNORMAL LOW (ref 22–32)
Calcium: 8.6 mg/dL — ABNORMAL LOW (ref 8.9–10.3)
Chloride: 125 mmol/L — ABNORMAL HIGH (ref 98–111)
Creatinine, Ser: 1.02 mg/dL — ABNORMAL HIGH (ref 0.44–1.00)
GFR, Estimated: >60 mL/min (ref >60)
Glucose, Bld: 181 mg/dL — ABNORMAL HIGH (ref 70–99)
Potassium: 4.1 mmol/L (ref 3.5–5.1)
Sodium: 153 mmol/L — ABNORMAL HIGH (ref 135–145)
Total Bilirubin: 1 mg/dL (ref 0.0–1.2)
Total Protein: 5.7 g/dL — ABNORMAL LOW (ref 6.5–8.1)

## 2023-08-05 LAB — LACTATE DEHYDROGENASE: LDH: 243 U/L — ABNORMAL HIGH (ref 98–192)

## 2023-08-05 LAB — PROTIME-INR
INR: 1.2 (ref 0.8–1.2)
Prothrombin Time: 15.6 s — ABNORMAL HIGH (ref 11.4–15.2)

## 2023-08-05 LAB — APTT: aPTT: 29 s (ref 24–36)

## 2023-08-05 LAB — MAGNESIUM: Magnesium: 2.8 mg/dL — ABNORMAL HIGH (ref 1.7–2.4)

## 2023-08-05 LAB — CBC
HCT: 20.9 % — ABNORMAL LOW (ref 36.0–46.0)
Hemoglobin: 7.2 g/dL — ABNORMAL LOW (ref 12.0–15.0)
MCH: 30.8 pg (ref 26.0–34.0)
MCHC: 34.4 g/dL (ref 30.0–36.0)
MCV: 89.3 fL (ref 80.0–100.0)
Platelets: 235 10*3/uL (ref 150–400)
RBC: 2.34 MIL/uL — ABNORMAL LOW (ref 3.87–5.11)
RDW: 14.9 % (ref 11.5–15.5)
WBC: 9.9 10*3/uL (ref 4.0–10.5)
nRBC: 2.2 % — ABNORMAL HIGH (ref 0.0–0.2)

## 2023-08-05 LAB — FERRITIN: Ferritin: 211 ng/mL (ref 11–307)

## 2023-08-05 LAB — PHOSPHORUS: Phosphorus: 2.2 mg/dL — ABNORMAL LOW (ref 2.5–4.6)

## 2023-08-05 LAB — FIBRINOGEN: Fibrinogen: 481 mg/dL — ABNORMAL HIGH (ref 210–475)

## 2023-08-05 MED ORDER — BISACODYL 10 MG RE SUPP
10.0000 mg | Freq: Every day | RECTAL | Status: DC | PRN
  Administered 2023-08-05 – 2023-08-11 (×2): 10 mg via RECTAL
  Filled 2023-08-05 (×2): qty 1

## 2023-08-05 MED ORDER — FAT EMUL FISH OIL/PLANT BASED 20% (SMOFLIPID)IV EMUL
250.0000 mL | INTRAVENOUS | Status: AC
  Administered 2023-08-05: 250 mL via INTRAVENOUS
  Filled 2023-08-05 (×2): qty 250

## 2023-08-05 MED ORDER — TRANEXAMIC ACID 1000 MG/10ML IV SOLN
1.5000 mg/kg/h | INTRAVENOUS | Status: AC
  Administered 2023-08-05: 1.5 mg/kg/h via INTRAVENOUS
  Filled 2023-08-05 (×3): qty 25

## 2023-08-05 MED ORDER — TRANEXAMIC ACID 1000 MG/10ML IV SOLN
1.5000 mg/kg/h | INTRAVENOUS | Status: DC
  Filled 2023-08-05: qty 25

## 2023-08-05 MED ORDER — SODIUM CHLORIDE 0.9% IV SOLUTION: Freq: Once | INTRAVENOUS | Status: AC

## 2023-08-05 MED ORDER — LEVOTHYROXINE SODIUM 88 MCG PO TABS
88.0000 ug | ORAL_TABLET | Freq: Every day | ORAL | Status: DC
  Administered 2023-08-08 – 2023-08-21 (×9): 88 ug
  Filled 2023-08-05 (×16): qty 1

## 2023-08-05 MED ORDER — TRACE MINERALS CU-MN-SE-ZN 300-55-60-3000 MCG/ML IV SOLN
INTRAVENOUS | Status: AC
  Filled 2023-08-05: qty 2000

## 2023-08-05 NOTE — Progress Notes (Signed)
PHARMACY - TOTAL PARENTERAL NUTRITION CONSULT NOTE   Indication: Unable to place NG tube for enteral nutrition   Patient Measurements: Height: 5\' 7"  (170.2 cm) Weight: 60.4 kg (133 lb 2.5 oz) IBW/kg (Calculated) : 61.6 TPN AdjBW (KG): 58.9 Body mass index is 20.86 kg/m.  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   Glucose / Insulin:  --BG 105 - 178 (SSI required 4u/24h) --on dexamethasone 4 mg IV once daily Electrolytes: hypernatremia, hypermagnesemia, hypophosphatemia Renal: SCr 1.42 > 1.02 Hepatic: LFTs wnl, bilirubin trending up Intake / Output; net (+) 3.5L MIVF: none GI Imaging: none recent GI Surgeries / Procedures: none recent  Central access: 08/04/23 TPN start date: 08/04/23   RD Assessment: Estimated Needs Total Energy Estimated Needs: 1800-2100kcal/day Total Protein Estimated Needs: 90-105g/day Total Fluid Estimated Needs: 1.8-2.1L/day  Current Nutrition:  none  Plan:  ---increase E8/10 TPN to 19mL/hr + SMOF lipids 250 mL over 12 hours beginning at 1800 ---Electrolytes in TPN: Na 78mEq/L, K 26mEq/L, Ca 4.57mEq/L, Mg 42mEq/L, and Phos 48mmol/L. Cl:Ac 0.49 ---Add standard MVI, thiamine 100mg  daily x 5 days (day#2) and trace elements to TPN ---continue Sensitive q4h SSI and adjust as needed  Monitor TPN labs on Mon/Thurs, daily until stable  Lowella Bandy 08/05/2023,7:21 AM

## 2023-08-05 NOTE — Consult Note (Signed)
[facility] CONSULT NOTE  Patient Care Team: Jerl Mina, MD as PCP - General (Family Medicine)  CHIEF COMPLAINTS/PURPOSE OF CONSULTATION: Bleeding post surgery.   HISTORY OF PRESENTING ILLNESS:  [name] 68 y.o.  female has been referred to [place] for further evaluation/work up-postoperative bleeding.  Patient underwent C2 dome laminectomy, C3-6 laminoplasty-on the 21st.  However given the neurologic deterioration-patient underwent further evaluation with MRI of the neck that showed epidural hematoma.  Patient underwent emergent evacuation hematoma on the 25th.  Hematology has been consulted for further evaluation given the hematoma; and also prior history of easy bruising.  I spoke to the patient husband over the phone-close states that patient had prior dental and nasal surgery that led to bleeding.  As per the husband patient does not bruise or bleed spontaneously.  However she never had any formal diagnosis of any bleeding diathesis.  No family history of any bleeding.  Of note patient underwent a hysterectomy few years ago without any bleeding complications.   Patient's labs-PT 16 PTT 46.  No prior history of liver disease.   Medications: No antiplatelet therapy or anticoagulants apart from prophylactic -last lovenox 1/25- 40 mgSQ.  Currently Lovenox on hold.  Review of Systems  Unable to perform ROS: Acuity of condition      MEDICAL HISTORY:  Past Medical History:  Diagnosis Date   Acute incomplete quadriplegia (HCC)    Acute respiratory failure with hypoxia (HCC)    AKI (acute kidney injury) (HCC)    Arthritis    Blood dyscrasia    patient stated she is a "free bleeder"   Central cord syndrome (HCC)    Dental caries    Diabetes mellitus without complication (HCC)    Diverticulosis    Dysphagia    Esophageal stricture    Facial cellulitis    GERD (gastroesophageal reflux disease)    Hypothyroidism    Neuropathy    Odynophagia    Pelvic fracture  (HCC)    Renal insufficiency    Spinal cord compression, post-traumatic (HCC)    Spinal cord injury, cervical region (HCC)    Stage 3a chronic kidney disease (CKD) (HCC)     SURGICAL HISTORY: Past Surgical History:  Procedure Laterality Date   ABDOMINAL HYSTERECTOMY     BILATERAL SALPINGECTOMY Bilateral 03/16/2017   Procedure: BILATERAL SALPINGECTOMY;  Surgeon: Schermerhorn, Ihor Austin, MD;  Location: [facility] ORS;  Service: Gynecology;  Laterality: Bilateral;   BIOPSY  05/03/2023   Procedure: BIOPSY;  Surgeon: Benancio Deeds, MD;  Location: [facility] ENDOSCOPY;  Service: Gastroenterology;;   COLONOSCOPY WITH PROPOFOL N/A 08/18/2018   Procedure: COLONOSCOPY WITH PROPOFOL;  Surgeon: Scot Jun, MD;  Location: [facility] ENDOSCOPY;  Service: Endoscopy;  Laterality: N/A;   CYSTOCELE REPAIR N/A 03/16/2017   Procedure: ANTERIOR REPAIR (CYSTOCELE);  Surgeon: Schermerhorn, Ihor Austin, MD;  Location: [facility] ORS;  Service: Gynecology;  Laterality: N/A;   ESOPHAGOGASTRODUODENOSCOPY (EGD) WITH PROPOFOL N/A 08/18/2018   Procedure: ESOPHAGOGASTRODUODENOSCOPY (EGD) WITH PROPOFOL;  Surgeon: Scot Jun, MD;  Location: [facility] ENDOSCOPY;  Service: Endoscopy;  Laterality: N/A;   ESOPHAGOGASTRODUODENOSCOPY (EGD) WITH PROPOFOL Left 05/01/2023   Procedure: ESOPHAGOGASTRODUODENOSCOPY (EGD) WITH PROPOFOL;  Surgeon: Benancio Deeds, MD;  Location: [facility] ENDOSCOPY;  Service: Gastroenterology;  Laterality: Left;   ESOPHAGOGASTRODUODENOSCOPY (EGD) WITH PROPOFOL N/A 05/03/2023   Procedure: ESOPHAGOGASTRODUODENOSCOPY (EGD) WITH PROPOFOL;  Surgeon: Benancio Deeds, MD;  Location: [facility] ENDOSCOPY;  Service: Gastroenterology;  Laterality: N/A;   POSTERIOR CERVICAL LAMINECTOMY N/A 07/28/2023   Procedure: C2  DOME LAMINECTOMY;  Surgeon: [name], MD;  Location: [facility] ORS;  Service: Neurosurgery;  Laterality: N/A;   POSTERIOR CERVICAL LAMINECTOMY N/A 08/01/2023   Procedure: POSTERIOR CERVICAL LAMINECTOMY;  Surgeon: [name], MD;  Location: [facility] ORS;  Service: Neurosurgery;  Laterality: N/A;   TEAR DUCT PROBING Right    TUBAL LIGATION     VAGINAL HYSTERECTOMY N/A 03/16/2017   Procedure: HYSTERECTOMY VAGINAL;  Surgeon: Schermerhorn, Ihor Austin, MD;  Location: [facility] ORS;  Service: Gynecology;  Laterality: N/A;   WISDOM TOOTH EXTRACTION      SOCIAL HISTORY: Social History   Socioeconomic History   Marital status: Married    Spouse name: Timothy   Number of children: Not on file   Years of education: Not on file   Highest education level: Not on file  Occupational History   Not on file  Tobacco Use   Smoking status: Never   Smokeless tobacco: Never  Vaping Use   Vaping status: Never Used  Substance and Sexual Activity   Alcohol use: No   Drug use: No   Sexual activity: Not Currently  Other Topics Concern   Not on file  Social History Narrative   Not on file   Social Drivers of Health   Financial Resource Strain: Not on file  Food Insecurity: No Food Insecurity (07/28/2023)   Hunger Vital Sign    Worried About Running Out of Food in the Last Year: Never true    Ran Out of Food in the Last Year: Never true  Transportation Needs: No Transportation Needs (07/28/2023)   PRAPARE - Administrator, Civil Service (Medical): No    Lack of Transportation (Non-Medical): No  Physical Activity: Not on file  Stress: Not on file  Social Connections: Moderately Integrated (07/28/2023)   Social Connection and Isolation Panel [NHANES]    Frequency of Communication with Friends and Family: Once a week    Frequency of Social Gatherings with Friends and Family: Once a week    Attends Religious Services: 1 to 4 times per year    Active Member of [employer] or Organizations: Yes    Attends Banker Meetings: 1 to 4 times per year    Marital Status: Married  Catering manager Violence: Not At Risk (07/28/2023)   Humiliation, Afraid, Rape, and Kick questionnaire    Fear of Current or  Ex-Partner: No    Emotionally Abused: No    Physically Abused: No    Sexually Abused: No    FAMILY HISTORY: Family History  Problem Relation Age of Onset   Breast cancer Maternal Aunt    Hypertension Mother    Hypertension Father    Diabetes Father     ALLERGIES:  is allergic to amoxicillin-pot clavulanate and ampicillin.  MEDICATIONS:  Current Facility-Administered Medications  Medication Dose Route Frequency Provider Last Rate Last Admin   .TPN (CLINIMIX-E) Adult   Intravenous Continuous TPN Lowella Bandy, RPH 42 mL/hr at 08/05/23 0700 Infusion Verify at 08/05/23 0700   .TPN (CLINIMIX-E) Adult   Intravenous Continuous TPN Lowella Bandy, RPH       0.9 %  sodium chloride infusion (Manually program via Guardrails IV Fluids)   Intravenous Once Earna Coder, MD       acidophilus (RISAQUAD) capsule 2 capsule  2 capsule Oral TID Mikey College T, MD   2 capsule at 08/01/23 1112   albumin human 25 % solution 12.5 g  12.5 g Intravenous Daily Aleskerov, Fuad,  MD 60 mL/hr at 08/05/23 1018 12.5 g at 08/05/23 1018   alum & mag hydroxide-simeth (MAALOX/MYLANTA) 200-200-20 MG/5ML suspension 30 mL  30 mL Oral Q6H PRN Frisbie, Brooke, PA-C       benzonatate (TESSALON) capsule 100 mg  100 mg Oral TID PRN Emeline General, MD       bisacodyl (DULCOLAX) EC tablet 5 mg  5 mg Oral Daily PRN Frisbie, Brooke, PA-C       Chlorhexidine Gluconate Cloth 2 % PADS 6 each  6 each Topical Daily Vida Rigger, MD   6 each at 08/04/23 2200   docusate (COLACE) 50 MG/5ML liquid 100 mg  100 mg Per Tube BID Otelia Sergeant, RPH       doxycycline (VIBRAMYCIN) 100 mg in sodium chloride 0.9 % 250 mL IVPB  100 mg Intravenous Q12H Vida Rigger, MD 125 mL/hr at 08/05/23 0700 Infusion Verify at 08/05/23 0700   fat emulsion 20 % (SMOFLIPID) infusion  250 mL Intravenous Continuous TPN Lowella Bandy, RPH       insulin aspart (novoLOG) injection 0-9 Units  0-9 Units Subcutaneous Q4H Jimmye Norman, NP   2  Units at 08/05/23 1651   ipratropium (ATROVENT) nebulizer solution 0.5 mg  0.5 mg Nebulization BID [name], MD   0.5 mg at 08/05/23 0716   [START ON 08/06/2023] levothyroxine (SYNTHROID) tablet 88 mcg  88 mcg Per Tube QAC breakfast Lowella Bandy, RPH       menthol-cetylpyridinium (CEPACOL) lozenge 3 mg  1 lozenge Oral PRN Joan Flores, PA-C       Oral care mouth rinse  15 mL Mouth Rinse 4 times per day [name], MD   15 mL at 08/05/23 1651   phenylephrine (NEO-SYNEPHRINE) 20mg /NS premix infusion  0-200 mcg/min Intravenous Titrated Rust-Chester, Britton L, NP 11.25 mL/hr at 08/05/23 1159 15 mcg/min at 08/05/23 1159   polyethylene glycol (MIRALAX / GLYCOLAX) packet 17 g  17 g Per Tube Daily Jimmye Norman, NP       senna-docusate (Senokot-S) tablet 1 tablet  1 tablet Oral QHS PRN Joan Flores, PA-C       sodium chloride flush (NS) 0.9 % injection 10-40 mL  10-40 mL Intracatheter Q12H [name], MD   10 mL at 08/04/23 2201   sodium chloride flush (NS) 0.9 % injection 10-40 mL  10-40 mL Intracatheter PRN [name], MD       sodium chloride flush (NS) 0.9 % injection 3-10 mL  3-10 mL Intravenous Q12H Frisbie, Brooke, PA-C   10 mL at 08/04/23 2201   sodium chloride flush (NS) 0.9 % injection 3-10 mL  3-10 mL Intravenous PRN Joan Flores, PA-C       tranexamic acid (CYKLOKAPRON) 2,500 mg in sodium chloride 0.9 % 250 mL (10 mg/mL) infusion  1.5 mg/kg/hr Intravenous To OR Louretta Shorten R, MD          PHYSICAL EXAMINATION:   Vitals:   08/05/23 1000 08/05/23 1100  BP: (!) 144/56 (!) 156/58  Pulse: 72 76  Resp: 19 (!) 21  Temp: 98.8 F (37.1 C) 98.8 F (37.1 C)  SpO2: 98% 98%   Filed Weights   07/28/23 0615 08/04/23 0500 08/05/23 0500  Weight: 129 lb 13.6 oz (58.9 kg) 131 lb 13.4 oz (59.8 kg) 133 lb 2.5 oz (60.4 kg)    Physical Exam Vitals and nursing note reviewed.  Constitutional:      Comments:  HENT:     Head:  Normocephalic and atraumatic.     Mouth/Throat:     Mouth: Mucous membranes are moist.     Pharynx: No oropharyngeal exudate.  Eyes:     Pupils: Pupils are equal, round, and reactive to light.  Cardiovascular:     Rate and Rhythm: Normal rate and regular rhythm.  Pulmonary:     Effort: No respiratory distress.     Breath sounds: No wheezing.     Comments: Decreased breath sounds bilaterally at bases.  No wheeze or crackles Abdominal:     General: Bowel sounds are normal. There is no distension.     Palpations: Abdomen is soft. There is no mass.     Tenderness: There is no abdominal tenderness. There is no guarding or rebound.  Musculoskeletal:        General: No tenderness. Normal range of motion.     Cervical back: Normal range of motion and neck supple.  Skin:    General: Skin is warm.  Psychiatric:        Mood and Affect: Affect normal.     LABORATORY DATA:  I have reviewed the data as listed Lab Results  Component Value Date   WBC 9.9 08/05/2023   HGB 7.2 (L) 08/05/2023   HCT 20.9 (L) 08/05/2023   MCV 89.3 08/05/2023   PLT 235 08/05/2023   Recent Labs    08/03/23 0450 08/04/23 0401 08/05/23 0403  NA 142 148* 153*  K 4.1 4.2 4.1  CL 113* 119* 125*  CO2 17* 15* 18*  GLUCOSE 114* 144* 181*  BUN 50* 50* 57*  CREATININE 1.13* 1.06* 1.02*  CALCIUM 7.9* 8.3* 8.6*  GFRNONAA 54* 58* >60  PROT 5.8* 6.0* 5.7*  ALBUMIN 2.6* 2.8* 2.9*  AST 22 21 19   ALT 16 15 15   ALKPHOS 51 51 46  BILITOT 0.9 1.5* 1.0     [place] EKG SITE RITE Result Date: 08/04/2023 If Site Rite image not attached, placement could not be confirmed due to current cardiac rhythm.  DG Chest Port 1 View Result Date: 08/04/2023 CLINICAL DATA:  Acute hypoxic respiratory failure. EXAM: PORTABLE CHEST 1 VIEW COMPARISON:  Chest radiograph dated 08/03/2023. FINDINGS: Interval removal of the feeding tube. There is complete opacification of the left hemithorax with shift of the mediastinum to the left of the  spine indicative of volume loss. The right lung remains clear. No pneumothorax. The cardiomediastinal borders are silhouetted. No acute osseous pathology. Cutaneous staples. IMPRESSION: Complete opacification of the left hemithorax with volume loss similar to prior radiograph. Electronically Signed   By: Elgie Collard M.D.   On: 08/04/2023 09:12   DG Chest Port 1 View Result Date: 08/03/2023 CLINICAL DATA:  Acute hypoxic respiratory failure. EXAM: PORTABLE CHEST 1 VIEW COMPARISON:  Fluoroscopic guided NG tube placement dated 08/03/2023. Chest radiograph dated 08/02/2023. FINDINGS: Patient is rotated to the left. NG tube tip overlies the region of the distal esophagus. There is new near-complete opacification of the left hemithorax, although, patient rotation may also contribute to some degree. The right lung appears clear. No definite pneumothorax identified. Postsurgical changes are again noted. IMPRESSION: 1. New near-complete opacification of the left hemithorax, although, patient rotation may also contribute to some degree. Findings may be related to a combination of pleural effusion, lung collapse, or other lung pathology. Please correlate clinically. 2. NG tube tip overlies the region of the distal esophagus. These results were called by telephone at the time of interpretation on 08/03/2023  at 4:59 pm to provider DANA NELSON , who verbally acknowledged these results. Electronically Signed   By: Hart Robinsons M.D.   On: 08/03/2023 17:06   DG Basil Dess Tube Plc W/Fl W/Rad Result Date: 08/03/2023 CLINICAL DATA:  Patient with a history of recent traumatic cervical central cord injury status post C3 through C6 laminoplasty and C2 dome and laminectomy surgery 07/29/2023 with drain placement. Patient with dysphagia and esophageal stricture. NG tube requested. Multiple failed attempts on the nursing unit. EXAM: NASO G TUBE PLACEMENT WITH FL AND WITH RAD CONTRAST:  None FLUOROSCOPY: Radiation Exposure Index (if  provided by the fluoroscopic device): 9.40 mGy kerma COMPARISON:  None Available. FINDINGS: Cortrak feeding tube inserted into the right nostril after being coated in lubricating jelly. There was resistance and difficulty getting the tube past the cervical esophagus. Once beyond that level the tube was advanced into the distal esophagus/proximal fundus, but was unable to advance any further. Multiple attempts were made to advance the tube, but it would only coil in the patient's mouth. The patient began to demonstrate oxygen desaturations and the decision was made to leave the tube with the tip in the region of the distal esophagus/proximal fundus. IMPRESSION: NG tube placement with the tip in the region of the distal esophagus/proximal fundus. Critical care team is aware of tube placement. Bedside nurse was in the room during the procedure and is also aware. Procedure performed by Alwyn Ren NP and supervised by Dr. Cherylann Ratel. Electronically Signed   By: Hart Robinsons M.D.   On: 08/03/2023 16:39   DG Chest Port 1 View Result Date: 08/02/2023 CLINICAL DATA:  Shortness of breath, recent extubation EXAM: PORTABLE CHEST 1 VIEW COMPARISON:  08/02/2023 FINDINGS: Cardiac shadow is stable. Postsurgical changes are again noted. Lungs are well aerated bilaterally. Some left basilar atelectasis is noted increased from the prior exam. Endotracheal tube has been removed. No other focal abnormality is seen. IMPRESSION: Left basilar atelectasis. Electronically Signed   By: Alcide Clever M.D.   On: 08/02/2023 19:44   DG Abd 1 View Result Date: 08/02/2023 CLINICAL DATA:  Nasogastric tube placement. EXAM: ABDOMEN - 1 VIEW COMPARISON:  None Available. FINDINGS: The enteric tube is looped in the distal esophagus, the tip is directed cranially not included in the field of view, in the level of the midesophagus or higher. No bowel dilatation in the upper abdomen. Surgical clips project over the right chest. IMPRESSION: The  enteric tube is looped in the distal esophagus, tip directed cranially not included in the field of view. Recommend repositioning. These results will be called to the ordering clinician or representative by the Radiologist Assistant, and communication documented in the PACS or [employer]. Electronically Signed   By: Narda Rutherford M.D.   On: 08/02/2023 15:31   DG Chest 1 View Result Date: 08/02/2023 CLINICAL DATA:  Status post intubation EXAM: PORTABLE CHEST 1 VIEW COMPARISON:  07/31/2023 FINDINGS: Endotracheal tube is noted in satisfactory position. Postsurgical changes are seen. Cardiac shadow is stable. Lungs are clear. No bony abnormality is noted. IMPRESSION: No acute abnormality noted. Endotracheal tube in satisfactory position Electronically Signed   By: Alcide Clever M.D.   On: 08/02/2023 03:47   DG Cervical Spine 2-3 Views Result Date: 08/02/2023 CLINICAL DATA:  Posterior laminectomy EXAM: CERVICAL SPINE - 2-3 VIEW COMPARISON:  None Available. FLUOROSCOPY TIME:  Radiation Exposure Index (as provided by the fluoroscopic device): 0.55 Gy If the device does not provide the exposure index: Fluoroscopy Time:  6 seconds Number of Acquired Images:  1 FINDINGS: Single spot film over the upper chest shows surgical instruments over the lower cervical spine and T2 level. IMPRESSION: Intraoperative localization. Electronically Signed   By: Alcide Clever M.D.   On: 08/02/2023 01:23   DG C-Arm 1-60 Min-No Report Result Date: 08/02/2023 Fluoroscopy was utilized by the requesting physician.  No radiographic interpretation.   MR CERVICAL SPINE WO CONTRAST Result Date: 08/01/2023 CLINICAL DATA:  Initial evaluation for acute myelopathy, recent surgery. EXAM: MRI CERVICAL AND THORACIC SPINE WITHOUT CONTRAST TECHNIQUE: Multiplanar and multiecho pulse sequences of the cervical spine, to include the craniocervical junction and cervicothoracic junction, and the thoracic spine, were obtained without intravenous  contrast. COMPARISON:  CT from earlier the same day. FINDINGS: MRI CERVICAL SPINE FINDINGS Alignment: Straightening of the normal cervical lordosis. No significant listhesis. Vertebrae: Postoperative changes from recent C3 through C6 laminectomy with laminoplasty. Associated fractures better appreciated on prior CT. No acute vertebral body fracture. Bone marrow signal intensity within normal limits. No worrisome osseous lesions. Cord: Lobulated heterogeneous epidural collection seen within the dorsal epidural space, likely reflecting epidural hematoma related to recent surgery. Collection extends from C2-3 into the upper thoracic spine, terminating at the level of T9 on corresponding thoracic spine MRI. Resultant severe diffuse spinal stenosis with flattening of the posterior spinal cord. No convincing cord signal changes within the cervical spine. Stenosis is maximal at the level of C4-5 where the thecal sac measures 3 mm in AP diameter on the left (series 19, image 14). Posterior Fossa, vertebral arteries, paraspinal tissues: Craniocervical junction within normal limits. Visualized brain and posterior fossa normal. Edema within the posterior paraspinous soft tissues, presumably postoperative in nature. No loculated soft tissue collections. Preserved flow voids seen within the vertebral arteries bilaterally. Disc levels: C2-C3: Broad-based posterior disc bulge. Small volume posterior epidural hematoma at this level. Moderate to severe spinal stenosis with mild cord flattening. Foramina remain patent. C3-C4: Left paracentral disc osteophyte complex indents the ventral thecal sac. Postoperative changes with dorsal epidural hematoma. Severe spinal stenosis, worse on the left. Secondary cord flattening without convincing cord signal changes. Severe left C4 foraminal narrowing. Right neural foramen remains patent. C4-C5: Posterior disc osteophyte complex flattens and partially faces the ventral thecal sac, asymmetric to  the left. Postoperative changes with dorsal epidural hematoma. Severe spinal stenosis with flattening of the cervical cord, greater on the left. No convincing cord signal changes. Moderate bilateral C5 foraminal narrowing. C5-C6: Diffuse disc bulge with bilateral uncovertebral spurring. Postoperative changes with dorsal epidural hematoma. Moderate to severe spinal stenosis with mild cord flattening but no cord signal changes. Mild bilateral C6 foraminal narrowing. C6-C7: Right paracentral disc osteophyte complex flattens the ventral thecal sac. Dorsal epidural hematoma. Secondary cord flattening without cord signal changes. Severe spinal stenosis. Mild right C7 foraminal narrowing. Left neural foramina remains patent. C7-T1: Small central to left paracentral disc protrusion. Dorsal epidural hematoma. Secondary cord flattening without cord signal changes. Severe spinal stenosis. Foramina remain patent. MRI THORACIC SPINE FINDINGS Alignment: Physiologic with preservation of the normal thoracic kyphosis. No listhesis. Vertebrae: Vertebral body height maintained without acute or chronic fracture. Bone marrow signal intensity within normal limits. No worrisome osseous lesions. No abnormal marrow edema. Cord: Above described dorsal epidural collection extends inferiorly to the level of T9 (series 19, image 9). Secondary diffuse severe spinal stenosis at these levels with cord compression. Suspected subtle patchy cord signal changes at the levels of T1-2 (series 20, image 6), T5-6 (series 20, image 17),  T7-8 (series 20, image 22). 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 Paraspinal and other soft tissues: Edema within the upper thoracic posterior paraspinous soft tissues, likely postoperative. No collections. Disc levels: Underlying ordinary for age multilevel disc desiccation within the upper and midthoracic spine. Small right paracentral disc protrusion noted at T5-6 (series 20, image 17). No other  significant focal disc herniation. Foramina remain patent. IMPRESSION: 1. Postoperative changes from recent C3 through C6 laminectomy with laminoplasty. Associated fractures better appreciated on prior CT. 2. Dorsal epidural collection extending from C2-3 through T9, likely epidural hematoma. Resultant severe diffuse spinal stenoses stenosis with cord compression at these levels. 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 Emergent neuro surgical 3. Underlying multilevel cervical spondylosis with resultant multilevel foraminal narrowing as above. Notable findings include severe left C4 foraminal stenosis, moderate bilateral C5 foraminal narrowing, with mild bilateral C6 and right C7 foraminal stenosis. Critical Value/emergent results were called by telephone at the time of interpretation on 08/01/2023 at 7:39 pm to provider [facility] , who verbally acknowledged these results. Electronically Signed   By: Rise Mu M.D.   On: 08/01/2023 19:41   MR THORACIC SPINE WO CONTRAST Result Date: 08/01/2023 CLINICAL DATA:  Initial evaluation for acute myelopathy, recent surgery. EXAM: MRI CERVICAL AND THORACIC SPINE WITHOUT CONTRAST TECHNIQUE: Multiplanar and multiecho pulse sequences of the cervical spine, to include the craniocervical junction and cervicothoracic junction, and the thoracic spine, were obtained without intravenous contrast. COMPARISON:  CT from earlier the same day. FINDINGS: MRI CERVICAL SPINE FINDINGS Alignment: Straightening of the normal cervical lordosis. No significant listhesis. Vertebrae: Postoperative changes from recent C3 through C6 laminectomy with laminoplasty. Associated fractures better appreciated on prior CT. No acute vertebral body fracture. Bone marrow signal intensity within normal limits. No worrisome osseous lesions. Cord: Lobulated heterogeneous epidural collection seen within the dorsal  epidural space, likely reflecting epidural hematoma related to recent surgery. Collection extends from C2-3 into the upper thoracic spine, terminating at the level of T9 on corresponding thoracic spine MRI. Resultant severe diffuse spinal stenosis with flattening of the posterior spinal cord. No convincing cord signal changes within the cervical spine. Stenosis is maximal at the level of C4-5 where the thecal sac measures 3 mm in AP diameter on the left (series 19, image 14). Posterior Fossa, vertebral arteries, paraspinal tissues: Craniocervical junction within normal limits. Visualized brain and posterior fossa normal. Edema within the posterior paraspinous soft tissues, presumably postoperative in nature. No loculated soft tissue collections. Preserved flow voids seen within the vertebral arteries bilaterally. Disc levels: C2-C3: Broad-based posterior disc bulge. Small volume posterior epidural hematoma at this level. Moderate to severe spinal stenosis with mild cord flattening. Foramina remain patent. C3-C4: Left paracentral disc osteophyte complex indents the ventral thecal sac. Postoperative changes with dorsal epidural hematoma. Severe spinal stenosis, worse on the left. Secondary cord flattening without convincing cord signal changes. Severe left C4 foraminal narrowing. Right neural foramen remains patent. C4-C5: Posterior disc osteophyte complex flattens and partially faces the ventral thecal sac, asymmetric to the left. Postoperative changes with dorsal epidural hematoma. Severe spinal stenosis with flattening of the cervical cord, greater on the left. No convincing cord signal changes. Moderate bilateral C5 foraminal narrowing. C5-C6: Diffuse disc bulge with bilateral uncovertebral spurring. Postoperative changes with dorsal epidural hematoma. Moderate to severe spinal stenosis with mild cord flattening but no cord signal changes. Mild bilateral C6 foraminal narrowing. C6-C7: Right paracentral disc  osteophyte complex flattens  the ventral thecal sac. Dorsal epidural hematoma. Secondary cord flattening without cord signal changes. Severe spinal stenosis. Mild right C7 foraminal narrowing. Left neural foramina remains patent. C7-T1: Small central to left paracentral disc protrusion. Dorsal epidural hematoma. Secondary cord flattening without cord signal changes. Severe spinal stenosis. Foramina remain patent. MRI THORACIC SPINE FINDINGS Alignment: Physiologic with preservation of the normal thoracic kyphosis. No listhesis. Vertebrae: Vertebral body height maintained without acute or chronic fracture. Bone marrow signal intensity within normal limits. No worrisome osseous lesions. No abnormal marrow edema. Cord: Above described dorsal epidural collection extends inferiorly to the level of T9 (series 19, image 9). Secondary diffuse severe spinal stenosis at these levels with cord compression. Suspected subtle patchy cord signal changes at the levels of T1-2 (series 20, image 6), T5-6 (series 20, image 17), T7-8 (series 20, image 22). 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 Paraspinal and other soft tissues: Edema within the upper thoracic posterior paraspinous soft tissues, likely postoperative. No collections. Disc levels: Underlying ordinary for age multilevel disc desiccation within the upper and midthoracic spine. Small right paracentral disc protrusion noted at T5-6 (series 20, image 17). No other significant focal disc herniation. Foramina remain patent. IMPRESSION: 1. Postoperative changes from recent C3 through C6 laminectomy with laminoplasty. Associated fractures better appreciated on prior CT. 2. Dorsal epidural collection extending from C2-3 through T9, likely epidural hematoma. Resultant severe diffuse spinal stenoses stenosis with cord compression at these levels. 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  Emergent neuro surgical 3. Underlying multilevel cervical spondylosis with resultant multilevel foraminal narrowing as above. Notable findings include severe left C4 foraminal stenosis, moderate bilateral C5 foraminal narrowing, with mild bilateral C6 and right C7 foraminal stenosis. Critical Value/emergent results were called by telephone at the time of interpretation on 08/01/2023 at 7:39 pm to provider [facility] , who verbally acknowledged these results. Electronically Signed   By: Rise Mu M.D.   On: 08/01/2023 19:41   CT CERVICAL SPINE WO CONTRAST Result Date: 08/01/2023 CLINICAL DATA:  Spine surgery/procedure. Postoperative. Infection suspected. EXAM: CT CERVICAL SPINE WITHOUT CONTRAST TECHNIQUE: Multidetector CT imaging of the cervical spine was performed without intravenous contrast. Multiplanar CT image reconstructions were also generated. RADIATION DOSE REDUCTION: This exam was performed according to the departmental dose-optimization program which includes automated exposure control, adjustment of the mA and/or kV according to patient size and/or use of iterative reconstruction technique. COMPARISON:  Cervical spine radiographs intraoperative fluoroscopy 07/28/2023, cervical spine radiographs 06/17/2023; MRI cervical spine 04/13/2023, CT cervical spine 04/13/2023 FINDINGS: Alignment: No sagittal spondylolisthesis. The atlantodens interval is intact. The facet joints are appropriately aligned. Skull base and vertebrae: Vertebral body heights are maintained. There are 2 new postsurgical changes of right C3 through C6 laminoplasty. There is malleable plate and screw fixation of the right laminoplasty at each of these levels. There is also lucency from a posterior left C3 lamina defect but with the anterior cortex minimally fused/intact (axial series 5, image 36). There is a complete defect within the posterior greater than anterior cortex of the left C4 lamina (axial series 5, image 41), left  C5 lamina (axial series 5, image 48), and left C6 lamina (axial series 5, image 57). This is consistent with left lamina "hinge fractures" as can happen during laminoplasty surgery but the majority of cases eventually fuse/heal. 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 Soft tissues and spinal canal: No central canal hyperdensity is seen to indicate CT  evidence of an acute epidural hematoma. No prevertebral soft tissue swelling. Disc levels: C2-3: Uncovertebral and facet joint hypertrophy again contribute to mild-to-moderate left neuroforaminal stenosis. Interval resection of a portion of the posterior arch of C2 (axial series 2, image 33) and resection of an ossicle at the posterior aspect of the central canal. Improved central canal space. C3-4: Facet joint and uncovertebral hypertrophy contribute to moderate to severe left neuroforaminal stenosis. Mild right neuroforaminal stenosis. The central canal space is improved. C4-5: Moderate bilateral facet joint hypertrophy. Interval right laminoplasty. Moderate bilateral neuroforaminal stenosis. Improved central canal space. C5-6: Bilateral facet joint and uncovertebral hypertrophy with mild bilateral neuroforaminal stenosis. Interval right laminoplasty with improved central canal space. C6-7: Mild bilateral facet joint hypertrophy. Mild left neuroforaminal narrowing. Interval laminoplasty with patent central canal. C7-T1 and T1-T2 are unremarkable. Upper chest: There is interstitial scarring within the bilateral lung apices, similar to prior. Other: No cervical chain lymphadenopathy. Status post thyroidectomy. IMPRESSION: 1. Interval right C3 through C6 laminoplasty with improved central canal space at these levels. 2. There are complete defects within the posterior greater than anterior cortices of the left C4, C5, and C6 lamina and a partial posterior C3 lamina cortical defect consistent with left lamina "hinge fractures" as can occur during laminoplasty  surgery. The majority of cases eventually fuse/heal. Recommend continued follow-up. 3. Interval resection of a portion of the posterior arch of C2 and resection of an ossicle at the posterior aspect of the central canal at C2-3. Improved central canal space at this level. 4. Moderate to severe left neuroforaminal stenosis at C3-4. Moderate bilateral neuroforaminal stenosis at C4-5. Electronically Signed   By: Neita Garnet M.D.   On: 08/01/2023 17:32   CT HEAD WO CONTRAST ( ) Result Date: 08/01/2023 CLINICAL DATA:  Acute neuro deficit.  Stroke suspected. EXAM: CT HEAD WITHOUT CONTRAST TECHNIQUE: Contiguous axial images were obtained from the base of the skull through the vertex without intravenous contrast. RADIATION DOSE REDUCTION: This exam was performed according to the departmental dose-optimization program which includes automated exposure control, adjustment of the mA and/or kV according to patient size and/or use of iterative reconstruction technique. COMPARISON:  CT brain 04/16/2023 FINDINGS: Brain: There is mild cortical atrophy, within normal limits for patient age. Redemonstration of normal variant cavum septum pellucidum and cavum vergae. The ventricles are normal in configuration. The basilar cisterns are patent. No mass, mass effect, or midline shift. No acute intracranial hemorrhage is seen. No abnormal extra-axial fluid collection. Preservation of the normal cortical gray-white interface without CT evidence of an acute major vascular territorial cortical based infarction. Vascular: No hyperdense vessel or unexpected calcification. Atherosclerotic calcifications are seen within the skull base arteries. Skull: Normal. Negative for fracture or focal lesion. Sinuses/Orbits: The visualized orbits are unremarkable. There is resolution of the prior right frontal sinus mucosal opacification. Improved now mild inferior left frontal sinus opacification. Persistence of left frontoethmoid opacification and  mildly improved moderate anterior left-greater-than-right ethmoid air cell mucosal opacification. Resolution of the prior posterior ethmoid air cell mucosal opacification. w there is a mastoid air cells are clear. Other: Resolution of soft tissue swelling of the posterior superior frontal scalp. Mild residual subcutaneous fat density/contusion. IMPRESSION: 1. No acute intracranial abnormality. 2. Mild cortical atrophy, within normal limits for patient age. 3. Improved paranasal sinus mucosal opacification as described above. Electronically Signed   By: Neita Garnet M.D.   On: 08/01/2023 16:53   CT CHEST WO CONTRAST Result Date: 07/31/2023 CLINICAL DATA:  Evaluate left basilar  infiltrate EXAM: CT CHEST WITHOUT CONTRAST TECHNIQUE: Multidetector CT imaging of the chest was performed following the standard protocol without IV contrast. RADIATION DOSE REDUCTION: This exam was performed according to the departmental dose-optimization program which includes automated exposure control, adjustment of the mA and/or kV according to patient size and/or use of iterative reconstruction technique. COMPARISON:  Chest x-ray from earlier in the same day FINDINGS: Cardiovascular: Somewhat limited due to lack of IV contrast. Atherosclerotic calcifications of the aorta are seen. No aneurysmal dilatation of the aorta is noted. Heavy coronary calcifications are seen. No cardiac enlargement is noted. Mediastinum/Nodes: Thoracic inlet is within normal limits. No hilar or mediastinal adenopathy is noted. The esophagus as visualized is within normal limits. Lungs/Pleura: Right lung is well aerated without focal infiltrate or effusion. Left lung shows significant lower lobe consolidation without effusion consistent with pneumonia. Some patchy atelectatic changes are noted in the lingula inferiorly as well. Upper Abdomen: Left kidney is atrophic. Right kidney is mildly hypertrophied. The remainder of the upper abdomen appears within normal  limits. Musculoskeletal: No acute rib abnormality is noted. Mild degenerative changes of the thoracic spine are seen. IMPRESSION: Left lower lobe pneumonia without sizable effusion. Aortic Atherosclerosis (ICD10-I70.0). Electronically Signed   By: Alcide Clever M.D.   On: 07/31/2023 19:33   DG Chest 2 View Result Date: 07/31/2023 CLINICAL DATA:  Hypoxemia EXAM: CHEST - 2 VIEW COMPARISON:  10/22/2021 FINDINGS: Left retrocardiac/basilar airspace consolidation with possible small effusion. Right lung clear. Heart size and mediastinal contours are within normal limits. Surgical clips thoracic inlet. Cervical fixation hardware partially visualized. IMPRESSION: Left basilar airspace disease with possible small effusion. Electronically Signed   By: Corlis Leak M.D.   On: 07/31/2023 15:20   DG Cervical Spine 2-3 Views Result Date: 07/28/2023 CLINICAL DATA:  Elective surgery. EXAM: CERVICAL SPINE - 2-3 VIEW COMPARISON:  Preoperative imaging. FINDINGS: Five fluoroscopic spot views of the cervical spine obtained in the operating room. Surgical instruments localize posteriorly at the C3 through C5 level with hardware for at C3, C4, C5. Fluoroscopy time 4.8 seconds. Dose 0.2142 mGy. IMPRESSION: Intraoperative fluoroscopy during cervical spine surgery. Electronically Signed   By: Narda Rutherford M.D.   On: 07/28/2023 11:29   DG C-Arm 1-60 Min-No Report Result Date: 07/28/2023 Fluoroscopy was utilized by the requesting physician.  No radiographic interpretation.   DG C-Arm 1-60 Min-No Report Result Date: 07/28/2023 Fluoroscopy was utilized by the requesting physician.  No radiographic interpretation.   DG C-Arm 1-60 Min-No Report Result Date: 07/28/2023 Fluoroscopy was utilized by the requesting physician.  No radiographic interpretation.    No problem-specific Assessment & Plan notes found for this encounter.  # 68 year old female patient with history of chronic kidney disease, chronic anemia currently status  post surgery C2   laminectomy, C3-6 laminoplasty-postoperative bleeding now s/p evacuation hematoma.   # Postoperative bleeding/cervical epidural hematoma-causing cord compression s/p evacuation on 1/25.   # History of easy bleeding post surgery-with history of normal PT PTT  # Chronic anemia/presently to CKD versus others  # Acute respiratory failure-left hemithorax opacification. Defer to PCCM team.   # Recommendations/plan:  # Check coagulation factors-extensive pathway/intrinsic pathway coagulation panels ordered.  Given the slightly abnormal PT PTT-recommend inhibitor studies.  # Recommend checking factor XIII levels.  Factor XIII deficiency-can cause delayed postoperative bleeding in the context of normal PT PTT.   # also check iron studies; ferritin; LDH; haptoglobin; retic count.   # While pending above workup-recommend cryoprecipitate. I would also recommend tranaxemic  acid infusion-given the critical nature of the recent postoperative bleeding.  However I would recommend given the absence of any active bleeding-hold off Kcentra/prothrombin concentrates.  Received the consent regarding the cryoprecipitate transfusion from the hospital over the phone.  Thank you Dr.Smith for allowing me to participate in the care of your pleasant patient. Please do not hesitate to contact me with questions or concerns in the interim.  Also discussed with Dr. Katrinka Blazing.   Earna Coder, MD 08/05/2023 5:50 PM

## 2023-08-05 NOTE — Progress Notes (Signed)
Neurosurgery Progress Note  History: [name] is here for C2 dome laminectomy, C3-6 laminoplasty and now s/p evacuation hematoma  POD 8/4: Very drowsy but arouses this morning to voice.  POD 7/3: Pt is very drowsy, but able to follow some commands  POD6/2: Pt is drowsy this morning asking "what do you want" when awaken  Interval POD5: [name] had a decline yesterday with her left upper extremity and then bilateral lower extremities.  CT scan was unremarkable MRI did show a large epidural hematoma.  She was taken emergently to the OR by Dr. Katrinka Blazing for decompression.  She has been monitored in the ICU overnight. She is still intubated and on sedation. She is on pressors to maintain [facility]   Hospital Course POD4: [name] is more awake today. She states she isnt having a lot of pain but she is thirsty.  POD3:Patient more alert this AM. However; hypoxia, tachycardia, SOB, low energy.  POD2: Very lethargic and difficult to arouse, states in quite a bit of pain. Medicine had been held due to sleepiness.    Physical Exam: Vitals:   08/05/23 0700 08/05/23 0716  BP: (!) 137/53   Pulse: 82 80  Resp: (!) 24 (!) 25  Temp: 99.1 F (37.3 C) 99.1 F (37.3 C)  SpO2: 98% 98%    Drowsy but arouses briefly on exam. Oriented to person and place. Unable to follow commands and asks "what legs" when asked to move her legs.  Incision c/d/I with staples in place HV 2 mL output overnight   Data:  Output by Drain (mL) 08/03/23 0701 - 08/03/23 1900 08/03/23 1901 - 08/04/23 0700 08/04/23 0701 - 08/04/23 1900 08/04/23 1901 - 08/05/23 0700 08/05/23 0701 - 08/05/23 0845  Closed System Drain Right;Superior;Medial Back 30 10 10 2       Assessment/Plan:  [name] POD6 s/p C2 dome laminectomy, C3-6 laminoplasty  On 1/21 with return to OR on 1/25 for cervical/thoracic laminectomy and epidural hematoma evacuation  -Continue to monitor in ICU, recommend continued maintenance of MAP pressures  above 80 mmHg. Will extend MAP management for a full 5 days post-op given the patients waxing and waning exam - pain control continue with IV and oral medications - Continue to hold DVT prophylaxis  - PT/OT when able - HV removed on 1/29 - PICC placement and TPN started.  - pneumonia - diagnosed 1/24 via CT, started on broad spectrum antibiotics. 1/27 CXR showing complete opacification of the left hemithorax with volume loss - Hem consult placed as patient has a history of delayed bleeding after surgery. Will reach out to on call provider today for an update.  - CC and IM assisting with non surgical management. We appreciate your assistance.    Manning Charity PA-C Department of Neurosurgery

## 2023-08-05 NOTE — Progress Notes (Signed)
NAME:  [name], MRN:  [id number], DOB:  [birth year], LOS: 8 ADMISSION DATE:  07/28/2023, CONSULTATION DATE:  08/02/23 REFERRING MD:  Ernestine Mcmurray CHIEF COMPLAINT:  cervical myelopathy  HPI  [name] is a 68 year old old female with PMHx of hypothyroidism, insulin-dependent DM, CKD stage III, dysphagia with esophageal strictures, facial cellulitis, GERD, neuropathy, arthritis, blood dyscrasia, and recent traumatic cervical central cord injury who presented to the hospital for elective cervical spine surgery.  Per patient's chart, patient was seen by neurosurgery on 07/28/2023 for follow-up after suffering a fall 3 months ago in which she sustained a cervical spinal cord injury.  Per patient, they deferred emergent surgical depression and wanted to let her have time to recover and work with physical therapy.  She continued to have hand weakness as well as lower extremity weakness and ambulatory issues.  Given the ongoing compression of C3-C6, laminoplasty and C2 dome laminectomy was indicated.    Hospital course: Patient underwent C3-6 laminoplasty and C2 dome and laminectomy surgery on 07/29/2023 with drain placement.   Pertinent Labs/Diagnostics Findings: Na+/ K+: 141/4.4 Glucose: 136 BUN/Cr.47/1.42 WBC: 5.8 K/L  Hgb/Hct: 7.4/22.4  Past Medical History  Hypothyroidism, insulin-dependent DM, CKD stage III, dysphagia with esophageal strictures, facial cellulitis, GERD, neuropathy, arthritis, blood dyscrasia, and recent traumatic cervical central cord injury  Significant Hospital Events   01/21: Patient underwent posterior C3-6 Cervical Laminoplasty with C2 inferior dome Laminectomy 01/22: POD #1. Pt. stable with no acute changes.  Working with PT and OT 01/23: POD #2.  Pt. noted to be very lethargic and difficult to arouse 01/24: POD #3. Pt. developed hypoxia tachycardia and shortness of breath.  Hospitalist consulted CT chest obtained and showed possible pneumonia.  Started on IV  antibiotics and DuoNebs. 01/25: POD #4. Pt. with new concerns of not moving the left arm.  CT head and cervical spine obtained with no new concerns.  Follow-up MRI however demonstrated epidural hematoma extending from the cervical spine to the lower thoracic spine with acute compression and T2 signal.  Taken emergently to the OR for surgical decompression 01/26: Pt. transferred to the ICU s/p emergent cervical decompression and evacuation of epidural hematoma.  Remained intubated. PCCM consulted 08/03/23- s/p surgery overnight, alert to self but drowsy with mild AKI and metabolic acidosis.  Lethargy noted have dcd cefepime and started doxycycline instead.  08/04/23- patient more alert able to wiggle toes to verbal.  Remains critically ill.  08/05/23- patient appears to have hypoactive delerium with waxing and waning sensorium.  At times she asks for things other times she's poorly responsive.  Her oxygenation has improved with nasal canula 5L/min.  S/p heme/onc evaluation today   Consults:  PCCM Neurosurgery Hospitalist  Procedures:  1/21:posterior C3-6 Cervical Laminoplasty with C2 inferior dome Laminectomy 1/25: s/p Epidural Hematoma Decompression 1/25: Intubation 1/25: Arterial Line  Significant Diagnostic Tests:  1/25:MRI  Thoracic and Cervical Spine> IMPRESSION: 1. Postoperative changes from recent C3 through C6 laminectomy with laminoplasty. Associated fractures better appreciated on prior CT. 2. Dorsal epidural collection extending from C2-3 through T9, likely epidural hematoma. Resultant severe diffuse spinal stenoses stenosis with cord compression at these levels. 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 Emergent neuro surgical 3. Underlying multilevel cervical spondylosis with resultant multilevel foraminal narrowing as above. Notable findings include severe left C4 foraminal stenosis, moderate  bilateral C5 foraminal narrowing, with mild bilateral C6 and right C7 foraminal stenosis.  1/25: Noncontrast CT Head &  Cervical Spine> IMPRESSION: 1. No acute intracranial abnormality. 2. Mild cortical atrophy, within normal limits for patient age. 3. Improved paranasal sinus mucosal opacification as described above.  1/24: CTA Chest> IMPRESSION: Left lower lobe pneumonia without sizable effusion.   Micro Data:  1/24: Blood culture x2> 1/24: MRSA PCR>> positive 1/24: Strep pneumo urinary antigen>  Antimicrobials:  1/24 Vancomycin >>  1/24 Cefepime >>  OBJECTIVE  Blood pressure (!) 137/53, pulse 80, temperature 99.1 F (37.3 C), resp. rate (!) 25, height 5\' 7"  (1.702 m), weight 60.4 kg, SpO2 98%.      Intake/Output Summary (Last 24 hours) at 08/05/2023 0827 Last data filed at 08/05/2023 0700 Gross per 24 hour  Intake 1773.96 ml  Output 3207 ml  Net -1433.04 ml   Filed Weights   07/28/23 0615 08/04/23 0500 08/05/23 0500  Weight: 58.9 kg 59.8 kg 60.4 kg   Physical Examination  GENERAL: lying down in bed, no apparent distress -encephalopathy noted CVS: RRR, no carotid bruit. LUNGS: decreased air entry on left with rhonchi ABDOMEN: Soft, NTTP EXTREMITIES: no edema or cyanosis NEURO: MENTAL STATUS: sedated on propofol LANG/SPEECH: non-verbal (sedated) CRANIAL NERVES: Pupils are equal and reactive, face symmetric - poor cough and gag to suctioning, rest of cranial nerves were deferred due to sedation. MOTOR: no spontaneous movements - no withdrawal to pain on either side (sedated) REFLEXES: hyporeflexic bilaterally SENSORY: no reaction to pain in both sides GAIT: deferred - sedated SKIN: intact except as below:      Labs/imaging that I havepersonally reviewed  (right click and "Reselect all SmartList Selections" daily)     Labs   CBC: Recent Labs  Lab 08/01/23 2005 08/02/23 0315 08/03/23 0450 08/04/23 0401 08/05/23 0403  WBC 5.8 5.2 9.8 10.1 9.9  HGB  7.4* 8.7* 8.3* 7.6* 7.2*  HCT 22.4* 26.4* 25.1* 23.4* 20.9*  MCV 89.2 90.4 88.4 89.0 89.3  PLT 174 131* 216 238 235    Basic Metabolic Panel: Recent Labs  Lab 08/01/23 0423 08/02/23 0315 08/03/23 0450 08/04/23 0401 08/05/23 0403  NA 141 138 142 148* 153*  K 4.4 5.1 4.1 4.2 4.1  CL 108 107 113* 119* 125*  CO2 20* 16* 17* 15* 18*  GLUCOSE 136* 193* 114* 144* 181*  BUN 47* 44* 50* 50* 57*  CREATININE 1.42* 1.08* 1.13* 1.06* 1.02*  CALCIUM 9.0 8.0* 7.9* 8.3* 8.6*  MG  --   --   --  2.3 2.8*  PHOS  --   --   --  2.6 2.2*   GFR: Estimated Creatinine Clearance: 52.4 mL/min (A) (by C-G formula based on SCr of 1.02 mg/dL (H)). Recent Labs  Lab 07/31/23 1457 07/31/23 1731 08/01/23 0423 08/02/23 0315 08/03/23 0450 08/03/23 1307 08/04/23 0401 08/05/23 0403  WBC 6.7  --    < > 5.2 9.8  --  10.1 9.9  LATICACIDVEN 1.2 1.8  --  1.8  --  0.9  --   --    < > = values in this interval not displayed.    Liver Function Tests: Recent Labs  Lab 08/02/23 0315 08/03/23 0450 08/04/23 0401 08/05/23 0403  AST 26 22 21 19   ALT 13 16 15 15   ALKPHOS 48 51 51 46  BILITOT 1.0 0.9 1.5* 1.0  PROT 5.8* 5.8* 6.0* 5.7*  ALBUMIN 2.7* 2.6* 2.8* 2.9*   No results for input(s): "LIPASE", "AMYLASE" in the last 168 hours. No results for input(s): "AMMONIA" in the last 168 hours.  ABG    Component  Value Date/Time   PHART 7.35 08/03/2023 1502   PCO2ART 29 (L) 08/03/2023 1502   PO2ART 66 (L) 08/03/2023 1502   HCO3 16.0 (L) 08/03/2023 1502   ACIDBASEDEF 8.2 (H) 08/03/2023 1502   O2SAT 92.5 08/03/2023 1502     Coagulation Profile: Recent Labs  Lab 08/01/23 2005  INR 1.3*    Cardiac Enzymes: No results for input(s): "CKTOTAL", "CKMB", "CKMBINDEX", "TROPONINI" in the last 168 hours.  HbA1C: Hemoglobin A1C  Date/Time Value Ref Range Status  02/20/2013 04:02 AM 10.3 (H) 4.2 - 6.3 % Final    Comment:    The American Diabetes Association recommends that a primary goal of therapy should  be <7% and that physicians should reevaluate the treatment regimen in patients with HbA1c values consistently >8%.    Hgb A1c MFr Bld  Date/Time Value Ref Range Status  04/13/2023 11:34 AM 5.9 (H) 4.8 - 5.6 % Final    Comment:    (NOTE) Pre diabetes:          5.7%-6.4%  Diabetes:              >6.4%  Glycemic control for   <7.0% adults with diabetes   08/11/2021 08:39 PM 7.2 (H) 4.8 - 5.6 % Final    Comment:    (NOTE) Pre diabetes:          5.7%-6.4%  Diabetes:              >6.4%  Glycemic control for   <7.0% adults with diabetes    CBG: Recent Labs  Lab 08/04/23 1546 08/04/23 2004 08/04/23 2334 08/05/23 0337 08/05/23 0722  GLUCAP 136* 178* 149* 164* 159*    Review of Systems:   Unable to be obtained secondary to the patient's intubated and sedated status.   Past Medical History  She,  has a past medical history of Acute incomplete quadriplegia (HCC), Acute respiratory failure with hypoxia (HCC), AKI (acute kidney injury) (HCC), Arthritis, Blood dyscrasia, Central cord syndrome (HCC), Dental caries, Diabetes mellitus without complication (HCC), Diverticulosis, Dysphagia, Esophageal stricture, Facial cellulitis, GERD (gastroesophageal reflux disease), Hypothyroidism, Neuropathy, Odynophagia, Pelvic fracture (HCC), Renal insufficiency, Spinal cord compression, post-traumatic (HCC), Spinal cord injury, cervical region (HCC), and Stage 3a chronic kidney disease (CKD) (HCC).   Surgical History    Past Surgical History:  Procedure Laterality Date   ABDOMINAL HYSTERECTOMY     BILATERAL SALPINGECTOMY Bilateral 03/16/2017   Procedure: BILATERAL SALPINGECTOMY;  Surgeon: Schermerhorn, Ihor Austin, MD;  Location: [facility] ORS;  Service: Gynecology;  Laterality: Bilateral;   BIOPSY  05/03/2023   Procedure: BIOPSY;  Surgeon: Benancio Deeds, MD;  Location: [facility] ENDOSCOPY;  Service: Gastroenterology;;   COLONOSCOPY WITH PROPOFOL N/A 08/18/2018   Procedure: COLONOSCOPY WITH PROPOFOL;   Surgeon: Scot Jun, MD;  Location: [facility] ENDOSCOPY;  Service: Endoscopy;  Laterality: N/A;   CYSTOCELE REPAIR N/A 03/16/2017   Procedure: ANTERIOR REPAIR (CYSTOCELE);  Surgeon: Schermerhorn, Ihor Austin, MD;  Location: [facility] ORS;  Service: Gynecology;  Laterality: N/A;   ESOPHAGOGASTRODUODENOSCOPY (EGD) WITH PROPOFOL N/A 08/18/2018   Procedure: ESOPHAGOGASTRODUODENOSCOPY (EGD) WITH PROPOFOL;  Surgeon: Scot Jun, MD;  Location: [facility] ENDOSCOPY;  Service: Endoscopy;  Laterality: N/A;   ESOPHAGOGASTRODUODENOSCOPY (EGD) WITH PROPOFOL Left 05/01/2023   Procedure: ESOPHAGOGASTRODUODENOSCOPY (EGD) WITH PROPOFOL;  Surgeon: Benancio Deeds, MD;  Location: [facility] ENDOSCOPY;  Service: Gastroenterology;  Laterality: Left;   ESOPHAGOGASTRODUODENOSCOPY (EGD) WITH PROPOFOL N/A 05/03/2023   Procedure: ESOPHAGOGASTRODUODENOSCOPY (EGD) WITH PROPOFOL;  Surgeon: Benancio Deeds, MD;  Location: [facility] ENDOSCOPY;  Service: Gastroenterology;  Laterality: N/A;   POSTERIOR CERVICAL LAMINECTOMY N/A 07/28/2023   Procedure: C2 DOME LAMINECTOMY;  Surgeon: [name], MD;  Location: [facility] ORS;  Service: Neurosurgery;  Laterality: N/A;   POSTERIOR CERVICAL LAMINECTOMY N/A 08/01/2023   Procedure: POSTERIOR CERVICAL LAMINECTOMY;  Surgeon: [name], MD;  Location: [facility] ORS;  Service: Neurosurgery;  Laterality: N/A;   TEAR DUCT PROBING Right    TUBAL LIGATION     VAGINAL HYSTERECTOMY N/A 03/16/2017   Procedure: HYSTERECTOMY VAGINAL;  Surgeon: Schermerhorn, Ihor Austin, MD;  Location: [facility] ORS;  Service: Gynecology;  Laterality: N/A;   WISDOM TOOTH EXTRACTION       Social History   reports that she has never smoked. She has never used smokeless tobacco. She reports that she does not drink alcohol and does not use drugs.   Family History   Her family history includes Breast cancer in her maternal aunt; Diabetes in her father; Hypertension in her father and mother.   Allergies Allergies  Allergen Reactions    Amoxicillin-Pot Clavulanate Itching and Anaphylaxis   Ampicillin Rash     Home Medications  Prior to Admission medications   Medication Sig Start Date End Date Taking? Authorizing Provider  acetaminophen (TYLENOL) 650 MG CR tablet Take 650 mg by mouth every 8 (eight) hours as needed for pain.   Yes [provider]  chlorhexidine (HIBICLENS) 4 % external liquid Apply 15 mLs (1 Application total) topically as directed for 30 doses. Use as directed daily for 5 days every other week for 6 weeks. 07/28/23  Yes Joan Flores, PA-C  cyanocobalamin (VITAMIN B12) 1000 MCG tablet Take 1,000 mcg by mouth daily.   Yes [provider]  cyclobenzaprine (FLEXERIL) 10 MG tablet Take 1 tablet (10 mg total) by mouth at bedtime. 06/15/23  Yes Lovorn, Aundra Millet, MD  diclofenac Sodium (VOLTAREN) 1 % GEL Apply 2 g topically 4 (four) times daily. Patient taking differently: Apply 2 g topically 4 (four) times daily as needed (pain). 05/11/23  Yes Setzer, Lynnell Jude, PA-C  fludrocortisone (FLORINEF) 0.1 MG tablet Take 2 tablets (0.2 mg total) by mouth daily. For orthostatic hypotension 06/15/23  Yes Lovorn, Aundra Millet, MD  gabapentin (NEURONTIN) 100 MG capsule Take 200 mg by mouth 2 (two) times daily.  08/16/15  Yes [provider]  glipiZIDE (GLUCOTROL) 5 MG tablet Take 10 mg by mouth daily before breakfast.   Yes [provider]  JARDIANCE 25 MG TABS tablet Take 25 mg by mouth daily.   Yes [provider]  lactase (LACTAID) 3000 units tablet Take 3,000 Units by mouth daily before breakfast.   Yes [provider]  levothyroxine (SYNTHROID) 88 MCG tablet Take 88 mcg by mouth daily before breakfast.   Yes [provider]  midodrine (PROAMATINE) 10 MG tablet Take 1.5 tablets (15 mg total) by mouth 3 (three) times daily with meals. For orthostatic hypotension 06/15/23  Yes Lovorn, Aundra Millet, MD  Multiple Vitamin (MULTIVITAMIN WITH MINERALS) TABS tablet Take 1 tablet by mouth daily.  04/22/23  Yes Sunnie Nielsen, DO  mupirocin ointment (BACTROBAN) 2 % Place 1 Application into the nose 2 (two) times daily for 60 doses. Use as directed 2 times daily for 5 days every other week for 6 weeks. 07/28/23 08/27/23 Yes Joan Flores, PA-C  pioglitazone (ACTOS) 30 MG tablet Take 30 mg by mouth daily.   Yes [provider]  Psyllium (METAMUCIL 3 IN 1 DAILY FIBER PO) Take 2 capsules by mouth daily.  Yes [provider]  rosuvastatin (CRESTOR) 5 MG tablet Take 5 mg by mouth at bedtime.   Yes [provider]  senna (SENOKOT) 8.6 MG TABS tablet Take 1 tablet (8.6 mg total) by mouth at bedtime. 05/11/23  Yes Setzer, Lynnell Jude, PA-C  Scheduled Meds:  acidophilus  2 capsule Oral TID   Chlorhexidine Gluconate Cloth  6 each Topical Daily   cyanocobalamin  1,000 mcg Oral Daily   dexamethasone (DECADRON) injection  4 mg Intravenous Q24H   docusate  100 mg Per Tube BID   insulin aspart  0-9 Units Subcutaneous Q4H   ipratropium  0.5 mg Nebulization BID   lactase  3,000 Units Oral QAC breakfast   levothyroxine  88 mcg Oral QAC breakfast   mouth rinse  15 mL Mouth Rinse 4 times per day   polyethylene glycol  17 g Per Tube Daily   rosuvastatin  5 mg Oral QHS   senna  1 tablet Oral QHS   sodium chloride flush  10-40 mL Intracatheter Q12H   sodium chloride flush  3-10 mL Intravenous Q12H   Continuous Infusions:  .TPN (CLINIMIX-E) Adult 42 mL/hr at 08/05/23 0700   albumin human Stopped (08/04/23 1116)   doxycycline (VIBRAMYCIN) IV 125 mL/hr at 08/05/23 0700   phenylephrine (NEO-SYNEPHRINE) Adult infusion 20 mcg/min (08/05/23 0700)   PRN Meds:.alum & mag hydroxide-simeth, benzonatate, bisacodyl, menthol-cetylpyridinium **OR** [DISCONTINUED] phenol, senna-docusate, sodium chloride flush, sodium chloride flush  Active Hospital Problem list   See systems below  Assessment & Plan:  #Acute Hypoxic Respiratory Failure #HCAP #Post Op Atelectasis -LTVV -VAP  prevention protocol -PRN &Scheduled bronchodilators -daily SAT & SBT -Wean PEEP and FiO2 for sats > 90% -Follow chest x-ray, ABG prn.  -prn fentanyl, propofol  for RASS -1    Encephalopathy   - will dc cefepime   - metabolic component - IVF with LR   - HAGMA   #Cervical Myelopathy PMHx: Cervical stenosis, Cervical Spinal Cord Injury s/p fall s/p C2 dome laminectomy, C3-6 laminoplasty on 1/21 now complicated by Epidural Hematoma s/p surgical decompression 1/26 -Neurosurgery following, apprec ~ Follow recommendation as below: -Vasopressors as needed to maintain MAP goal >85 -Hold midodrine and Florinef while on pressors -Neuro checks q2h -PT/OT once stable -Foley catheter for urinary retention -Pain management (Fentanyl while intubated)   #HCAP Met SIRS criteria: tachycardia, sob, hypoxia CT chest shows Left lower lobe pneumonia -Monitor fever curve -Trend WBC's & Procalcitonin -Follow cultures as above -Continue empiric abx ppx pending cultures & sensitivities  #Acute Blood Loss Anemia Post Op #Hx of Blood Dyscrasia -Monitor for S/Sx of bleeding -Trend CBC (H&H q6h) -SCD's for VTE Prophylaxis (chemical ppx contraindicated) -Transfuse for Hgb <7  (has received 2 units pRBC's so far on 1/25)  #AKI on CKD stage III #NAGMA -Monitor I&O's / urinary output -Follow BMP -Ensure adequate renal perfusion -Avoid nephrotoxic agents as able -Replace electrolytes as indicated  #T2DM -HgbA1c goal  -CBG's q4; Target range of 140 to 180 -SSI -Follow ICU Hypo/Hyperglycemia protocol -Hold home Glipizide    #Sedation needs in setting of mechanical ventilation -Maintain a RASS goal of 0 to -1 -Propofol and Fentanyl to maintain RASS goal -Avoid sedating medications as able -Daily wake up assessment   #Hypothyroidism -Check TSH, Free T4 -continue home synthroid  Best practice:  Diet:  NPO Pain/Anxiety/Delirium protocol (if indicated): Yes (RASS goal -1) VAP protocol (if  indicated): Yes DVT prophylaxis: Contraindicated GI prophylaxis: H2B Glucose control:  SSI Yes Central venous access:  N/A Arterial line:  Yes, and it is still needed Foley:  Yes, and it is still needed Mobility:  bed rest  PT consulted: N/A Last date of multidisciplinary goals of care discussion []  Code Status:  full code Disposition: ICU   = Goals of Care = Code Status Order: FULL  Primary Emergency Contact: Mount,Timothy S, Home Phone: [phone] Wishes to pursue full aggressive treatment and intervention options, including CPR and intubation, but goals of care will be addressed on going with family if that should become necessary.  Critical care provider statement:   Total critical care time: 33 minutes   Performed by: Karna Christmas MD   Critical care time was exclusive of separately billable procedures and treating other patients.   Critical care was necessary to treat or prevent imminent or life-threatening deterioration.   Critical care was time spent personally by me on the following activities: development of treatment plan with patient and/or surrogate as well as nursing, discussions with consultants, evaluation of patient's response to treatment, examination of patient, obtaining history from patient or surrogate, ordering and performing treatments and interventions, ordering and review of laboratory studies, ordering and review of radiographic studies, pulse oximetry and re-evaluation of patient's condition.    Vida Rigger, M.D.  Pulmonary & Critical Care Medicine

## 2023-08-05 NOTE — Consult Note (Signed)
Consultation Note Date: 08/05/2023   Patient Name: [name]  DOB: Sep 23, 1955  MRN: [id number]  Age / Sex: 68 y.o., female  PCP: Jerl Mina, MD Referring Physician: [name], MD  Reason for Consultation: Establishing goals of care  HPI/Patient Profile: Per EMR notes, [name] is a 68 year old old female with PMHx of hypothyroidism, insulin-dependent DM, CKD stage III, dysphagia with esophageal strictures, facial cellulitis, GERD, neuropathy, arthritis, blood dyscrasia, and recent traumatic cervical central cord injury who presented to the hospital for elective cervical spine surgery. Patient underwent C3-6 laminoplasty and C2 dome and laminectomy surgery on 07/29/2023 with drain placement.    Clinical Assessment and Goals of Care:   Notes and labs reviewed.  In to see patient.  She is currently resting in bed with brother at bedside.  Her eyes are partially open with my arrival to bedside;  patient does not speak to me.  Brother states patient is married and has a son named [name], and a daughter [name].  He states that her son is around 60 years old and lives with them.  He advises that the patient's son has intellectual disability.  He states son works a part-time job, but is unable to drive and otherwise stays in his room playing video games.  He states patient provides a lot of care and support for him, and completes the housework.  He states patient's husband has 2 jobs and has to work 7 days/week. Brother discusses that patient had been working at [employer] when she fell and required subsequent surgery.   He states he sees much improvement in the patient's status from yesterday to today.    Called to speak with patient's husband.  He discusses that patient had been experiencing poor p.o. intake prior to this admission.  He advises that she has had issues with acid reflux and discusses  esophageal dilation.  He states patient had started [facility] for diabetes, but had a 30 pound weight loss, so the medication was stopped right before this admission.  He states she wears dentures at baseline.  With conversation, he states he does not believe that his wife would ever want a feeding tube.  He states he is unsure if his wife would ever want CPR or ventilator support in the future.  He states he really does not want to speak for her and is hopeful that her status will improve where she is able to have these conversations herself.    SUMMARY OF RECOMMENDATIONS    PMT will follow     Primary Diagnoses: Present on Admission:  Numbness and tingling  Arm weakness  Spinal cord injury at C5-C7 level without injury of spinal bone (HCC)  Spinal stenosis in cervical region  Spinal cord injury, cervical region [facility])  Weakness of right lower extremity  Muscle weakness of right upper extremity  Cervical myelopathy (HCC)   I have reviewed the medical record, interviewed the patient and family, and examined the patient. The following aspects are pertinent.  Past Medical History:  Diagnosis Date   Acute incomplete quadriplegia (HCC)    Acute respiratory failure with hypoxia (HCC)    AKI (acute kidney injury) (HCC)    Arthritis    Blood dyscrasia    patient stated she is a "free bleeder"   Central cord syndrome (HCC)    Dental caries    Diabetes mellitus without complication (HCC)    Diverticulosis    Dysphagia    Esophageal stricture    Facial cellulitis    GERD (gastroesophageal reflux disease)    Hypothyroidism    Neuropathy    Odynophagia    Pelvic fracture (HCC)    Renal insufficiency    Spinal cord compression, post-traumatic (HCC)    Spinal cord injury, cervical region (HCC)    Stage 3a chronic kidney disease (CKD) (HCC)    Social History   Socioeconomic History   Marital status: Married    Spouse name: [name]   Number of children: Not on file   Years of  education: Not on file   Highest education level: Not on file  Occupational History   Not on file  Tobacco Use   Smoking status: Never   Smokeless tobacco: Never  Vaping Use   Vaping status: Never Used  Substance and Sexual Activity   Alcohol use: No   Drug use: No   Sexual activity: Not Currently  Other Topics Concern   Not on file  Social History Narrative   Not on file   Social Drivers of Health   Cheryl Resource Strain: Not on file  Food Insecurity: No Food Insecurity (07/28/2023)   Hunger Vital Sign    Worried About Running Out of Food in the Last Year: Never true    Ran Out of Food in the Last Year: Never true  Transportation Needs: No Transportation Needs (07/28/2023)   PRAPARE - Administrator, Civil Service (Medical): No    Lack of Transportation (Non-Medical): No  Physical Activity: Not on file  Stress: Not on file  Social Connections: Moderately Integrated (07/28/2023)   Social Connection and Isolation Panel [NHANES]    Frequency of Communication with Friends and Family: Once a week    Frequency of Social Gatherings with Friends and Family: Once a week    Attends Religious Services: 1 to 4 times per year    Active Member of [employer] or Organizations: Yes    Attends Banker Meetings: 1 to 4 times per year    Marital Status: Married   Family History  Problem Relation Age of Onset   Breast cancer Maternal Aunt    Hypertension Mother    Hypertension Father    Diabetes Father    Scheduled Meds:  acidophilus  2 capsule Oral TID   Chlorhexidine Gluconate Cloth  6 each Topical Daily   docusate  100 mg Per Tube BID   insulin aspart  0-9 Units Subcutaneous Q4H   ipratropium  0.5 mg Nebulization BID   [START ON 08/06/2023] levothyroxine  88 mcg Per Tube QAC breakfast   mouth rinse  15 mL Mouth Rinse 4 times per day   polyethylene glycol  17 g Per Tube Daily   sodium chloride flush  10-40 mL Intracatheter Q12H   sodium chloride flush  3-10  mL Intravenous Q12H   Continuous Infusions:  .TPN (CLINIMIX-E) Adult 42 mL/hr at 08/05/23 0700   .TPN (CLINIMIX-E) Adult     albumin human 12.5 g (08/05/23 1018)   doxycycline (VIBRAMYCIN) IV 125 mL/hr at  08/05/23 0700   fat emul(SMOFlipid)     phenylephrine (NEO-SYNEPHRINE) Adult infusion 15 mcg/min (08/05/23 1159)   PRN Meds:.alum & mag hydroxide-simeth, benzonatate, bisacodyl, menthol-cetylpyridinium **OR** [DISCONTINUED] phenol, senna-docusate, sodium chloride flush, sodium chloride flush Medications Prior to Admission:  Prior to Admission medications   Medication Sig Start Date End Date Taking? Authorizing Provider  acetaminophen (TYLENOL) 650 MG CR tablet Take 650 mg by mouth every 8 (eight) hours as needed for pain.   Yes [provider]  chlorhexidine (HIBICLENS) 4 % external liquid Apply 15 mLs (1 Application total) topically as directed for 30 doses. Use as directed daily for 5 days every other week for 6 weeks. 07/28/23  Yes Joan Flores, PA-C  cyanocobalamin (VITAMIN B12) 1000 MCG tablet Take 1,000 mcg by mouth daily.   Yes [provider]  cyclobenzaprine (FLEXERIL) 10 MG tablet Take 1 tablet (10 mg total) by mouth at bedtime. 06/15/23  Yes Lovorn, Aundra Millet, MD  diclofenac Sodium (VOLTAREN) 1 % GEL Apply 2 g topically 4 (four) times daily. Patient taking differently: Apply 2 g topically 4 (four) times daily as needed (pain). 05/11/23  Yes Setzer, Lynnell Jude, PA-C  fludrocortisone (FLORINEF) 0.1 MG tablet Take 2 tablets (0.2 mg total) by mouth daily. For orthostatic hypotension 06/15/23  Yes Lovorn, Aundra Millet, MD  gabapentin (NEURONTIN) 100 MG capsule Take 200 mg by mouth 2 (two) times daily.  08/16/15  Yes [provider]  glipiZIDE (GLUCOTROL) 5 MG tablet Take 10 mg by mouth daily before breakfast.   Yes [provider]  JARDIANCE 25 MG TABS tablet Take 25 mg by mouth daily.   Yes [provider]  lactase (LACTAID) 3000 units tablet Take 3,000  Units by mouth daily before breakfast.   Yes [provider]  levothyroxine (SYNTHROID) 88 MCG tablet Take 88 mcg by mouth daily before breakfast.   Yes [provider]  midodrine (PROAMATINE) 10 MG tablet Take 1.5 tablets (15 mg total) by mouth 3 (three) times daily with meals. For orthostatic hypotension 06/15/23  Yes Lovorn, Aundra Millet, MD  Multiple Vitamin (MULTIVITAMIN WITH MINERALS) TABS tablet Take 1 tablet by mouth daily. 04/22/23  Yes Sunnie Nielsen, DO  mupirocin ointment (BACTROBAN) 2 % Place 1 Application into the nose 2 (two) times daily for 60 doses. Use as directed 2 times daily for 5 days every other week for 6 weeks. 07/28/23 08/27/23 Yes Joan Flores, PA-C  pioglitazone (ACTOS) 30 MG tablet Take 30 mg by mouth daily.   Yes [provider]  Psyllium (METAMUCIL 3 IN 1 DAILY FIBER PO) Take 2 capsules by mouth daily.   Yes [provider]  rosuvastatin (CRESTOR) 5 MG tablet Take 5 mg by mouth at bedtime.   Yes [provider]  senna (SENOKOT) 8.6 MG TABS tablet Take 1 tablet (8.6 mg total) by mouth at bedtime. 05/11/23  Yes Setzer, Lynnell Jude, PA-C   Allergies  Allergen Reactions   Amoxicillin-Pot Clavulanate Itching and Anaphylaxis   Ampicillin Rash   Review of Systems  Unable to perform ROS   Physical Exam Constitutional:      Comments: Eyes partially open the patient does not speak  Pulmonary:     Effort: Pulmonary effort is normal.     Vital Signs: BP (!) 156/58   Pulse 76   Temp 98.8 F (37.1 C)   Resp (!) 21   Ht 5\' 7"  (1.702 m)   Wt 60.4 kg   SpO2 98%   BMI 20.86 kg/m  Pain  Scale: PAINAD   Pain Score: 0-No pain   SpO2: SpO2: 98 % O2 Device:SpO2: 98 % O2 Flow Rate: .O2 Flow Rate (L/min): 6 L/min  IO: Intake/output summary:  Intake/Output Summary (Last 24 hours) at 08/05/2023 1358 Last data filed at 08/05/2023 0700 Gross per 24 hour  Intake 1773.96 ml  Output 3207 ml  Net -1433.04 ml    LBM: Last BM Date  :  (PTA) Baseline Weight: Weight: 58.9 kg Most recent weight: Weight: 60.4 kg       Signed by: Morton Stall, NP   Please contact Palliative Medicine Team phone at [phone] for questions and concerns.  For individual provider: See Loretha Stapler

## 2023-08-06 ENCOUNTER — Inpatient Hospital Stay: Payer: Medicare Other

## 2023-08-06 ENCOUNTER — Encounter: Payer: Self-pay | Admitting: Neurosurgery

## 2023-08-06 DIAGNOSIS — G959 Disease of spinal cord, unspecified: Secondary | ICD-10-CM | POA: Diagnosis not present

## 2023-08-06 DIAGNOSIS — Z7189 Other specified counseling: Secondary | ICD-10-CM | POA: Diagnosis not present

## 2023-08-06 LAB — CBC
HCT: 20.1 % — ABNORMAL LOW (ref 36.0–46.0)
Hemoglobin: 6.8 g/dL — ABNORMAL LOW (ref 12.0–15.0)
MCH: 30.5 pg (ref 26.0–34.0)
MCHC: 33.8 g/dL (ref 30.0–36.0)
MCV: 90.1 fL (ref 80.0–100.0)
Platelets: 224 10*3/uL (ref 150–400)
RBC: 2.23 MIL/uL — ABNORMAL LOW (ref 3.87–5.11)
RDW: 14.7 % (ref 11.5–15.5)
WBC: 10.9 10*3/uL — ABNORMAL HIGH (ref 4.0–10.5)
nRBC: 1.5 % — ABNORMAL HIGH (ref 0.0–0.2)

## 2023-08-06 LAB — GLUCOSE, CAPILLARY
Glucose-Capillary: 203 mg/dL — ABNORMAL HIGH (ref 70–99)
Glucose-Capillary: 252 mg/dL — ABNORMAL HIGH (ref 70–99)
Glucose-Capillary: 166 mg/dL — ABNORMAL HIGH (ref 70–99)
Glucose-Capillary: 186 mg/dL — ABNORMAL HIGH (ref 70–99)
Glucose-Capillary: 236 mg/dL — ABNORMAL HIGH (ref 70–99)

## 2023-08-06 LAB — PREPARE CRYOPRECIPITATE
Unit division: 0
Unit division: 0

## 2023-08-06 LAB — HEMOGLOBIN AND HEMATOCRIT, BLOOD
HCT: 20.5 % — ABNORMAL LOW (ref 36.0–46.0)
HCT: 26.3 % — ABNORMAL LOW (ref 36.0–46.0)
Hemoglobin: 6.6 g/dL — ABNORMAL LOW (ref 12.0–15.0)
Hemoglobin: 8.5 g/dL — ABNORMAL LOW (ref 12.0–15.0)

## 2023-08-06 LAB — COMPREHENSIVE METABOLIC PANEL
ALT: 16 U/L (ref 0–44)
AST: 20 U/L (ref 15–41)
Albumin: 3.2 g/dL — ABNORMAL LOW (ref 3.5–5.0)
Alkaline Phosphatase: 49 U/L (ref 38–126)
Anion gap: 7 (ref 5–15)
BUN: 62 mg/dL — ABNORMAL HIGH (ref 8–23)
CO2: 19 mmol/L — ABNORMAL LOW (ref 22–32)
Calcium: 9.1 mg/dL (ref 8.9–10.3)
Chloride: 128 mmol/L — ABNORMAL HIGH (ref 98–111)
Creatinine, Ser: 0.83 mg/dL (ref 0.44–1.00)
GFR, Estimated: >60 mL/min (ref >60)
Glucose, Bld: 241 mg/dL — ABNORMAL HIGH (ref 70–99)
Potassium: 3.8 mmol/L (ref 3.5–5.1)
Sodium: 154 mmol/L — ABNORMAL HIGH (ref 135–145)
Total Bilirubin: 0.7 mg/dL (ref 0.0–1.2)
Total Protein: 6.1 g/dL — ABNORMAL LOW (ref 6.5–8.1)

## 2023-08-06 LAB — BPAM CRYOPRECIPITATE
Blood Product Expiration Date: 202501292324
Blood Product Expiration Date: 202501300230
ISSUE DATE / TIME: 202501291831
ISSUE DATE / TIME: 202501292208
Unit Type and Rh: 5100
Unit Type and Rh: 6200

## 2023-08-06 LAB — MAGNESIUM: Magnesium: 2.4 mg/dL (ref 1.7–2.4)

## 2023-08-06 LAB — PHOSPHORUS: Phosphorus: 2.2 mg/dL — ABNORMAL LOW (ref 2.5–4.6)

## 2023-08-06 LAB — PREPARE RBC (CROSSMATCH)

## 2023-08-06 LAB — TRIGLYCERIDES: Triglycerides: 536 mg/dL — ABNORMAL HIGH (ref <150)

## 2023-08-06 MED ORDER — INSULIN ASPART 100 UNIT/ML IJ SOLN
3.0000 [IU] | INTRAMUSCULAR | Status: AC
  Administered 2023-08-07 – 2023-08-11 (×25): 3 [IU] via SUBCUTANEOUS
  Filled 2023-08-06 (×24): qty 1

## 2023-08-06 MED ORDER — INSULIN GLARGINE-YFGN 100 UNIT/ML ~~LOC~~ SOLN
5.0000 [IU] | Freq: Every day | SUBCUTANEOUS | Status: DC
  Administered 2023-08-06 – 2023-08-19 (×14): 5 [IU] via SUBCUTANEOUS
  Filled 2023-08-06 (×14): qty 0.05

## 2023-08-06 MED ORDER — DEXTROSE 5 % IV SOLN: INTRAVENOUS | Status: AC

## 2023-08-06 MED ORDER — TRACE MINERALS CU-MN-SE-ZN 300-55-60-3000 MCG/ML IV SOLN
INTRAVENOUS | Status: AC
  Filled 2023-08-06: qty 2000

## 2023-08-06 MED ORDER — SODIUM CHLORIDE 0.9% IV SOLUTION: Freq: Once | INTRAVENOUS | Status: DC

## 2023-08-06 MED ORDER — INSULIN ASPART 100 UNIT/ML IJ SOLN
0.0000 [IU] | INTRAMUSCULAR | Status: DC
  Administered 2023-08-06: 7 [IU] via SUBCUTANEOUS
  Administered 2023-08-06: 4 [IU] via SUBCUTANEOUS
  Administered 2023-08-06: 11 [IU] via SUBCUTANEOUS
  Administered 2023-08-06 – 2023-08-07 (×3): 4 [IU] via SUBCUTANEOUS
  Administered 2023-08-07: 7 [IU] via SUBCUTANEOUS
  Administered 2023-08-07: 4 [IU] via SUBCUTANEOUS
  Administered 2023-08-07: 7 [IU] via SUBCUTANEOUS
  Administered 2023-08-07: 4 [IU] via SUBCUTANEOUS
  Administered 2023-08-07: 7 [IU] via SUBCUTANEOUS
  Administered 2023-08-08: 4 [IU] via SUBCUTANEOUS
  Administered 2023-08-08: 7 [IU] via SUBCUTANEOUS
  Administered 2023-08-08 (×2): 4 [IU] via SUBCUTANEOUS
  Administered 2023-08-08: 7 [IU] via SUBCUTANEOUS
  Administered 2023-08-09 (×3): 3 [IU] via SUBCUTANEOUS
  Administered 2023-08-09: 7 [IU] via SUBCUTANEOUS
  Administered 2023-08-09: 3 [IU] via SUBCUTANEOUS
  Administered 2023-08-09: 4 [IU] via SUBCUTANEOUS
  Administered 2023-08-10: 7 [IU] via SUBCUTANEOUS
  Administered 2023-08-10 (×4): 4 [IU] via SUBCUTANEOUS
  Administered 2023-08-10: 3 [IU] via SUBCUTANEOUS
  Administered 2023-08-11: 11 [IU] via SUBCUTANEOUS
  Administered 2023-08-11: 7 [IU] via SUBCUTANEOUS
  Administered 2023-08-11: 4 [IU] via SUBCUTANEOUS
  Administered 2023-08-11: 7 [IU] via SUBCUTANEOUS
  Administered 2023-08-11: 4 [IU] via SUBCUTANEOUS
  Administered 2023-08-11 – 2023-08-12 (×2): 7 [IU] via SUBCUTANEOUS
  Administered 2023-08-12: 4 [IU] via SUBCUTANEOUS
  Filled 2023-08-06 (×36): qty 1

## 2023-08-06 NOTE — Progress Notes (Signed)
SLP Cancellation Note  Patient Details Name: Onia Shiflett MRN: [id number] DOB: Aug 05, 1955   Cancelled treatment:       Reason Eval/Treat Not Completed: Medical issues which prohibited therapy;Patient's level of consciousness. Pt now with hypoactive delirium- with waxing and waning alertness per MD and RN. Given continued medical compromise and fluctuations in alertness along with known esophageal and pharyngeal dysphagia- pt is at high risk for aspiration.   Given difficult passing NGT through esophagus (refer to nursing note on 1/26), recommend completion of barium swallow test, when pt is medically ready, to further assess pt's esophagus prior to considering return to PO.   MD aware of recommendations.   [place] Rayon Mcchristian Clapp  MS [facility] SLP   [place] J Clapp 08/06/2023, 11:21 AM

## 2023-08-06 NOTE — Plan of Care (Signed)
  Problem: Fluid Volume: Goal: Ability to maintain a balanced intake and output will improve Outcome: Progressing   Problem: Health Behavior/Discharge Planning: Goal: Ability to identify and utilize available resources and services will improve Outcome: Progressing   Problem: Metabolic: Goal: Ability to maintain appropriate glucose levels will improve Outcome: Progressing   Problem: Nutritional: Goal: Maintenance of adequate nutrition will improve Outcome: Progressing   Problem: Skin Integrity: Goal: Risk for impaired skin integrity will decrease Outcome: Progressing   Problem: Tissue Perfusion: Goal: Adequacy of tissue perfusion will improve Outcome: Progressing   Problem: Clinical Measurements: Goal: Will remain free from infection Outcome: Progressing Goal: Diagnostic test results will improve Outcome: Progressing Goal: Respiratory complications will improve Outcome: Progressing Goal: Cardiovascular complication will be avoided Outcome: Progressing

## 2023-08-06 NOTE — Progress Notes (Addendum)
Daily Progress Note   Patient Name: [name]       Date: 08/06/2023 DOB: 1956/06/11  Age: 68 y.o. MRN#: [id number] Attending Physician: [name], MD Primary Care Physician: Jerl Mina, MD Admit Date: 07/28/2023  Reason for Consultation/Follow-up: Establishing goals of care  Subjective: Notes and labs reviewed. In to see patient. She is resting in bed at this time. She does not respond to me. No family at bedside.   Per conversation with husband yesterday, he does not want to make decisions on behalf of his wife unless he has to, and is hopeful her status will improve such that she can have discussions herself.    Length of Stay: 9  Current Medications: Scheduled Meds:   acidophilus  2 capsule Oral TID   Chlorhexidine Gluconate Cloth  6 each Topical Daily   docusate  100 mg Per Tube BID   insulin aspart  0-20 Units Subcutaneous Q4H   insulin glargine-yfgn  5 Units Subcutaneous Daily   ipratropium  0.5 mg Nebulization BID   levothyroxine  88 mcg Per Tube QAC breakfast   mouth rinse  15 mL Mouth Rinse 4 times per day   polyethylene glycol  17 g Per Tube Daily   sodium chloride flush  10-40 mL Intracatheter Q12H   sodium chloride flush  3-10 mL Intravenous Q12H    Continuous Infusions:  .TPN (CLINIMIX-E) Adult 84 mL/hr at 08/06/23 0705   .TPN (CLINIMIX-E) Adult     albumin human 12.5 g (08/06/23 1034)   dextrose 50 mL/hr at 08/06/23 0705   doxycycline (VIBRAMYCIN) IV 125 mL/hr at 08/06/23 0705   phenylephrine (NEO-SYNEPHRINE) Adult infusion 15 mcg/min (08/06/23 0705)   tranexamic acid (CYKLOKAPRON) 2,500 mg in sodium chloride 0.9 % 250 mL (10 mg/mL) infusion 1.5 mg/kg/hr (08/06/23 0705)    PRN Meds: alum & mag hydroxide-simeth, benzonatate, bisacodyl,  menthol-cetylpyridinium **OR** [DISCONTINUED] phenol, senna-docusate, sodium chloride flush, sodium chloride flush  Physical Exam Constitutional:      Comments: Eyes closed.   Pulmonary:     Effort: Pulmonary effort is normal.  Skin:    General: Skin is warm and dry.             Vital Signs: BP (!) 163/57   Pulse 74   Temp 99.7 F (37.6 C)   Resp (!)  30   Ht 5\' 7"  (1.702 m)   Wt 61.9 kg   SpO2 97%   BMI 21.37 kg/m  SpO2: SpO2: 97 % O2 Device: O2 Device: Nasal Cannula O2 Flow Rate: O2 Flow Rate (L/min): 5 L/min  Intake/output summary:  Intake/Output Summary (Last 24 hours) at 08/06/2023 1506 Last data filed at 08/06/2023 1610 Gross per 24 hour  Intake 3273.47 ml  Output 3400 ml  Net -126.53 ml   LBM: Last BM Date :  (PTA) Baseline Weight: Weight: 58.9 kg Most recent weight: Weight: 61.9 kg     Patient Active Problem List   Diagnosis Date Noted   Myelopathy of thoracic region 08/04/2023   Hematoma 08/04/2023   Spinal stenosis, thoracic 08/04/2023   Malnutrition of moderate degree 08/03/2023   Lobar pneumonia (HCC) 08/01/2023   MRSA (methicillin resistant staph aureus) culture positive 08/01/2023   Cervical myelopathy (HCC) 07/28/2023   Spinal stenosis in cervical region 06/18/2023   Spinal cord injury at C5-C7 level without injury of spinal bone (HCC) 06/18/2023   Arm weakness 06/18/2023   Numbness and tingling 06/18/2023   Spasticity 06/15/2023   Orthostatic hypotension 06/15/2023   Esophageal stricture 05/01/2023   Dysphagia 04/30/2023   Odynophagia 04/30/2023   Gastroesophageal reflux disease 04/30/2023   Acute incomplete quadriplegia (HCC) 04/23/2023   Spinal cord injury, cervical region (HCC) 04/22/2023   Spinal cord compression, post-traumatic (HCC) 04/13/2023   Central cord syndrome (HCC) 04/13/2023   Muscle weakness of right upper extremity 04/13/2023   Weakness of right lower extremity 04/13/2023   Pelvic fracture (HCC) 04/13/2023   Cellulitis  08/12/2021   Facial cellulitis 08/11/2021   Dental caries 08/11/2021   COVID-19 virus infection 08/11/2021   Diabetes mellitus without complication (HCC)    Hypothyroidism    Stage 3a chronic kidney disease (HCC)    Non-dose-related adverse reaction to medication    Post-operative state 03/16/2017   AKI (acute kidney injury) (HCC) 01/18/2017   Acute respiratory failure with hypoxia (HCC) 11/04/2015    Palliative Care Assessment & Plan    Recommendations/Plan: PMT will follow.   Code Status:    Code Status Orders  (From admission, onward)           Start     Ordered   07/28/23 1459  Full code  Continuous       Question:  By:  Answer:  Procedural case: previous code status reviewed   07/28/23 1458           Code Status History     Date Active Date Inactive Code Status Order ID Comments User Context   04/22/2023 1239 05/12/2023 1531 Full Code [id number]  Milinda Antis, PA-C Inpatient   04/22/2023 1239 04/22/2023 1239 Full Code [id number]  Milinda Antis, PA-C Inpatient   04/13/2023 1640 04/22/2023 1237 Full Code [id number]  Lilia Pro, MD ED   04/13/2023 1629 04/13/2023 1640 Full Code [id number]  Erin Fulling, MD ED   08/11/2021 2222 08/16/2021 1950 Full Code [id number]  Andris Baumann, MD ED   03/16/2017 1059 03/17/2017 1935 Full Code [id number]  Schermerhorn, Ihor Austin, MD Inpatient   01/18/2017 1745 01/19/2017 2033 Full Code [id number]  Enedina Finner, MD ED   11/04/2015 1219 11/06/2015 2047 Full Code [id number]  Katha Hamming, MD ED     Thank you for allowing the Palliative Medicine Team to assist in the care of this patient.    Morton Stall, NP  Please contact Palliative Medicine Team phone at [phone] for  questions and concerns.

## 2023-08-06 NOTE — Progress Notes (Signed)
NAME:  [name], MRN:  [id number], DOB:  Jul 19, 1955, LOS: 9 ADMISSION DATE:  07/28/2023, CONSULTATION DATE:  08/02/23 REFERRING MD:  Ernestine Mcmurray CHIEF COMPLAINT:  cervical myelopathy  HPI  [name] is a 68 year old old female with PMHx of hypothyroidism, insulin-dependent DM, CKD stage III, dysphagia with esophageal strictures, facial cellulitis, GERD, neuropathy, arthritis, blood dyscrasia, and recent traumatic cervical central cord injury who presented to the hospital for elective cervical spine surgery.  Per patient's chart, patient was seen by neurosurgery on 07/28/2023 for follow-up after suffering a fall 3 months ago in which she sustained a cervical spinal cord injury.  Per patient, they deferred emergent surgical depression and wanted to let her have time to recover and work with physical therapy.  She continued to have hand weakness as well as lower extremity weakness and ambulatory issues.  Given the ongoing compression of C3-C6, laminoplasty and C2 dome laminectomy was indicated.    Hospital course: Patient underwent C3-6 laminoplasty and C2 dome and laminectomy surgery on 07/29/2023 with drain placement.   Pertinent Labs/Diagnostics Findings: Na+/ K+: 141/4.4 Glucose: 136 BUN/Cr.47/1.42 WBC: 5.8 K/L  Hgb/Hct: 7.4/22.4  Past Medical History  Hypothyroidism, insulin-dependent DM, CKD stage III, dysphagia with esophageal strictures, facial cellulitis, GERD, neuropathy, arthritis, blood dyscrasia, and recent traumatic cervical central cord injury  Significant Hospital Events   01/21: Patient underwent posterior C3-6 Cervical Laminoplasty with C2 inferior dome Laminectomy 01/22: POD #1. Pt. stable with no acute changes.  Working with PT and OT 01/23: POD #2.  Pt. noted to be very lethargic and difficult to arouse 01/24: POD #3. Pt. developed hypoxia tachycardia and shortness of breath.  Hospitalist consulted CT chest obtained and showed possible pneumonia.  Started on IV  antibiotics and DuoNebs. 01/25: POD #4. Pt. with new concerns of not moving the left arm.  CT head and cervical spine obtained with no new concerns.  Follow-up MRI however demonstrated epidural hematoma extending from the cervical spine to the lower thoracic spine with acute compression and T2 signal.  Taken emergently to the OR for surgical decompression 01/26: Pt. transferred to the ICU s/p emergent cervical decompression and evacuation of epidural hematoma.  Remained intubated. PCCM consulted 08/03/23- s/p surgery overnight, alert to self but drowsy with mild AKI and metabolic acidosis.  Lethargy noted have dcd cefepime and started doxycycline instead.  08/04/23- patient more alert able to wiggle toes to verbal.  Remains critically ill.  08/05/23- patient appears to have hypoactive delerium with waxing and waning sensorium.  At times she asks for things other times she's poorly responsive.  Her oxygenation has improved with nasal canula 5L/min.  S/p heme/onc evaluation today 08/06/23- patient seen at bedside, she was able to whisper few words tome.  I discussed PO intake with speech pathologist and SLP with barium was ordered however patient unable to complete this study. She is still very weak physically barely able to wiggle toes.  H/h <7 s/p prbc.  Renal function is improved.   Consults:  PCCM Neurosurgery Hospitalist  Procedures:  1/21:posterior C3-6 Cervical Laminoplasty with C2 inferior dome Laminectomy 1/25: s/p Epidural Hematoma Decompression 1/25: Intubation 1/25: Arterial Line  Significant Diagnostic Tests:  1/25:MRI  Thoracic and Cervical Spine> IMPRESSION: 1. Postoperative changes from recent C3 through C6 laminectomy with laminoplasty. Associated fractures better appreciated on prior CT. 2. Dorsal epidural collection extending from C2-3 through T9, likely epidural hematoma. Resultant severe diffuse spinal stenoses stenosis with cord compression at these levels. Probable subtle  patchy cord  signal changes within the thoracic spine as above, concerning for acute compressive myelopathy. Emergent neuro surgical 3. Underlying multilevel cervical spondylosis with resultant multilevel foraminal narrowing as above. Notable findings include severe left C4 foraminal stenosis, moderate bilateral C5 foraminal narrowing, with mild bilateral C6 and right C7 foraminal stenosis.  1/25: Noncontrast CT Head &Cervical Spine> IMPRESSION: 1. No acute intracranial abnormality. 2. Mild cortical atrophy, within normal limits for patient age. 3. Improved paranasal sinus mucosal opacification as described above.  1/24: CTA Chest> IMPRESSION: Left lower lobe pneumonia without sizable effusion.   Micro Data:  1/24: Blood culture x2> 1/24: MRSA PCR>> positive 1/24: Strep pneumo urinary antigen>  Antimicrobials:  1/24 Vancomycin >>  1/24 Cefepime >>  OBJECTIVE  Blood pressure (!) 159/55, pulse 73, temperature 98.4 F (36.9 C), resp. rate (!) 26, height 5\' 7"  (1.702 m), weight 61.9 kg, SpO2 97%.      Intake/Output Summary (Last 24 hours) at 08/06/2023 1057 Last data filed at 08/06/2023 0705 Gross per 24 hour  Intake 3273.47 ml  Output 3400 ml  Net -126.53 ml   Filed Weights   08/04/23 0500 08/05/23 0500 08/06/23 0500  Weight: 59.8 kg 60.4 kg 61.9 kg   Physical Examination  GENERAL: lying down in bed, no apparent distress -encephalopathy noted CVS: RRR, no carotid bruit. LUNGS: decreased air entry on left with rhonchi ABDOMEN: Soft, NTTP EXTREMITIES: no edema or cyanosis NEURO: MENTAL STATUS: sedated on propofol LANG/SPEECH: non-verbal (sedated) CRANIAL NERVES: Pupils are equal and reactive, face symmetric - poor cough and gag to suctioning, rest of cranial nerves were deferred due to sedation. MOTOR: no spontaneous movements - no withdrawal to pain on either side (sedated) REFLEXES: hyporeflexic bilaterally SENSORY: no reaction to pain in both sides GAIT: deferred  -  SKIN: intact except as below:      Labs/imaging that I havepersonally reviewed  (right click and "Reselect all SmartList Selections" daily)     Labs   CBC: Recent Labs  Lab 08/02/23 0315 08/03/23 0450 08/04/23 0401 08/05/23 0403 08/06/23 0348  WBC 5.2 9.8 10.1 9.9 10.9*  HGB 8.7* 8.3* 7.6* 7.2* 6.8*  HCT 26.4* 25.1* 23.4* 20.9* 20.1*  MCV 90.4 88.4 89.0 89.3 90.1  PLT 131* 216 238 235 224    Basic Metabolic Panel: Recent Labs  Lab 08/02/23 0315 08/03/23 0450 08/04/23 0401 08/05/23 0403 08/06/23 0348  NA 138 142 148* 153* 154*  K 5.1 4.1 4.2 4.1 3.8  CL 107 113* 119* 125* 128*  CO2 16* 17* 15* 18* 19*  GLUCOSE 193* 114* 144* 181* 241*  BUN 44* 50* 50* 57* 62*  CREATININE 1.08* 1.13* 1.06* 1.02* 0.83  CALCIUM 8.0* 7.9* 8.3* 8.6* 9.1  MG  --   --  2.3 2.8* 2.4  PHOS  --   --  2.6 2.2* 2.2*   GFR: Estimated Creatinine Clearance: 65.7 mL/min (by C-G formula based on SCr of 0.83 mg/dL). Recent Labs  Lab 07/31/23 1457 07/31/23 1731 08/01/23 0423 08/02/23 0315 08/03/23 0450 08/03/23 1307 08/04/23 0401 08/05/23 0403 08/06/23 0348  WBC 6.7  --    < > 5.2 9.8  --  10.1 9.9 10.9*  LATICACIDVEN 1.2 1.8  --  1.8  --  0.9  --   --   --    < > = values in this interval not displayed.    Liver Function Tests: Recent Labs  Lab 08/02/23 0315 08/03/23 0450 08/04/23 0401 08/05/23 0403 08/06/23 0348  AST 26 22 21 19  20  ALT 13 16 15 15 16   ALKPHOS 48 51 51 46 49  BILITOT 1.0 0.9 1.5* 1.0 0.7  PROT 5.8* 5.8* 6.0* 5.7* 6.1*  ALBUMIN 2.7* 2.6* 2.8* 2.9* 3.2*   No results for input(s): "LIPASE", "AMYLASE" in the last 168 hours. No results for input(s): "AMMONIA" in the last 168 hours.  ABG    Component Value Date/Time   PHART 7.35 08/03/2023 1502   PCO2ART 29 (L) 08/03/2023 1502   PO2ART 66 (L) 08/03/2023 1502   HCO3 16.0 (L) 08/03/2023 1502   ACIDBASEDEF 8.2 (H) 08/03/2023 1502   O2SAT 92.5 08/03/2023 1502     Coagulation Profile: Recent Labs   Lab 08/01/23 2005 08/05/23 1811  INR 1.3* 1.2    Cardiac Enzymes: No results for input(s): "CKTOTAL", "CKMB", "CKMBINDEX", "TROPONINI" in the last 168 hours.  HbA1C: Hemoglobin A1C  Date/Time Value Ref Range Status  02/20/2013 04:02 AM 10.3 (H) 4.2 - 6.3 % Final    Comment:    The American Diabetes Association recommends that a primary goal of therapy should be <7% and that physicians should reevaluate the treatment regimen in patients with HbA1c values consistently >8%.    Hgb A1c MFr Bld  Date/Time Value Ref Range Status  04/13/2023 11:34 AM 5.9 (H) 4.8 - 5.6 % Final    Comment:    (NOTE) Pre diabetes:          5.7%-6.4%  Diabetes:              >6.4%  Glycemic control for   <7.0% adults with diabetes   08/11/2021 08:39 PM 7.2 (H) 4.8 - 5.6 % Final    Comment:    (NOTE) Pre diabetes:          5.7%-6.4%  Diabetes:              >6.4%  Glycemic control for   <7.0% adults with diabetes    CBG: Recent Labs  Lab 08/05/23 1526 08/05/23 1946 08/05/23 2356 08/06/23 0332 08/06/23 0721  GLUCAP 175* 198* 220* 203* 252*    Review of Systems:   Unable to be obtained secondary to the patient's encephalopathy  Past Medical History  She,  has a past medical history of Acute incomplete quadriplegia (HCC), Acute respiratory failure with hypoxia (HCC), AKI (acute kidney injury) (HCC), Arthritis, Blood dyscrasia, Central cord syndrome (HCC), Dental caries, Diabetes mellitus without complication (HCC), Diverticulosis, Dysphagia, Esophageal stricture, Facial cellulitis, GERD (gastroesophageal reflux disease), Hypothyroidism, Neuropathy, Odynophagia, Pelvic fracture (HCC), Renal insufficiency, Spinal cord compression, post-traumatic (HCC), Spinal cord injury, cervical region (HCC), and Stage 3a chronic kidney disease (CKD) (HCC).   Surgical History    Past Surgical History:  Procedure Laterality Date   ABDOMINAL HYSTERECTOMY     BILATERAL SALPINGECTOMY Bilateral 03/16/2017    Procedure: BILATERAL SALPINGECTOMY;  Surgeon: Schermerhorn, Ihor Austin, MD;  Location: [facility] ORS;  Service: Gynecology;  Laterality: Bilateral;   BIOPSY  05/03/2023   Procedure: BIOPSY;  Surgeon: Benancio Deeds, MD;  Location: [facility] ENDOSCOPY;  Service: Gastroenterology;;   COLONOSCOPY WITH PROPOFOL N/A 08/18/2018   Procedure: COLONOSCOPY WITH PROPOFOL;  Surgeon: Scot Jun, MD;  Location: [facility] ENDOSCOPY;  Service: Endoscopy;  Laterality: N/A;   CYSTOCELE REPAIR N/A 03/16/2017   Procedure: ANTERIOR REPAIR (CYSTOCELE);  Surgeon: Schermerhorn, Ihor Austin, MD;  Location: [facility] ORS;  Service: Gynecology;  Laterality: N/A;   ESOPHAGOGASTRODUODENOSCOPY (EGD) WITH PROPOFOL N/A 08/18/2018   Procedure: ESOPHAGOGASTRODUODENOSCOPY (EGD) WITH PROPOFOL;  Surgeon: Scot Jun, MD;  Location: [facility] ENDOSCOPY;  Service: Endoscopy;  Laterality: N/A;   ESOPHAGOGASTRODUODENOSCOPY (EGD) WITH PROPOFOL Left 05/01/2023   Procedure: ESOPHAGOGASTRODUODENOSCOPY (EGD) WITH PROPOFOL;  Surgeon: Benancio Deeds, MD;  Location: [facility] ENDOSCOPY;  Service: Gastroenterology;  Laterality: Left;   ESOPHAGOGASTRODUODENOSCOPY (EGD) WITH PROPOFOL N/A 05/03/2023   Procedure: ESOPHAGOGASTRODUODENOSCOPY (EGD) WITH PROPOFOL;  Surgeon: Benancio Deeds, MD;  Location: [facility] ENDOSCOPY;  Service: Gastroenterology;  Laterality: N/A;   POSTERIOR CERVICAL LAMINECTOMY N/A 07/28/2023   Procedure: C2 DOME LAMINECTOMY;  Surgeon: [name], MD;  Location: [facility] ORS;  Service: Neurosurgery;  Laterality: N/A;   POSTERIOR CERVICAL LAMINECTOMY N/A 08/01/2023   Procedure: POSTERIOR CERVICAL LAMINECTOMY;  Surgeon: [name], MD;  Location: [facility] ORS;  Service: Neurosurgery;  Laterality: N/A;   TEAR DUCT PROBING Right    TUBAL LIGATION     VAGINAL HYSTERECTOMY N/A 03/16/2017   Procedure: HYSTERECTOMY VAGINAL;  Surgeon: Schermerhorn, Ihor Austin, MD;  Location: [facility] ORS;  Service: Gynecology;  Laterality: N/A;   WISDOM TOOTH EXTRACTION        Social History   reports that she has never smoked. She has never used smokeless tobacco. She reports that she does not drink alcohol and does not use drugs.   Family History   Her family history includes Breast cancer in her maternal aunt; Diabetes in her father; Hypertension in her father and mother.   Allergies Allergies  Allergen Reactions   Amoxicillin-Pot Clavulanate Itching and Anaphylaxis   Ampicillin Rash     Home Medications  Prior to Admission medications   Medication Sig Start Date End Date Taking? Authorizing Provider  acetaminophen (TYLENOL) 650 MG CR tablet Take 650 mg by mouth every 8 (eight) hours as needed for pain.   Yes [provider]  chlorhexidine (HIBICLENS) 4 % external liquid Apply 15 mLs (1 Application total) topically as directed for 30 doses. Use as directed daily for 5 days every other week for 6 weeks. 07/28/23  Yes Joan Flores, PA-C  cyanocobalamin (VITAMIN B12) 1000 MCG tablet Take 1,000 mcg by mouth daily.   Yes [provider]  cyclobenzaprine (FLEXERIL) 10 MG tablet Take 1 tablet (10 mg total) by mouth at bedtime. 06/15/23  Yes Lovorn, Aundra Millet, MD  diclofenac Sodium (VOLTAREN) 1 % GEL Apply 2 g topically 4 (four) times daily. Patient taking differently: Apply 2 g topically 4 (four) times daily as needed (pain). 05/11/23  Yes Setzer, Lynnell Jude, PA-C  fludrocortisone (FLORINEF) 0.1 MG tablet Take 2 tablets (0.2 mg total) by mouth daily. For orthostatic hypotension 06/15/23  Yes Lovorn, Aundra Millet, MD  gabapentin (NEURONTIN) 100 MG capsule Take 200 mg by mouth 2 (two) times daily.  08/16/15  Yes [provider]  glipiZIDE (GLUCOTROL) 5 MG tablet Take 10 mg by mouth daily before breakfast.   Yes [provider]  JARDIANCE 25 MG TABS tablet Take 25 mg by mouth daily.   Yes [provider]  lactase (LACTAID) 3000 units tablet Take 3,000 Units by mouth daily before breakfast.   Yes [provider]   levothyroxine (SYNTHROID) 88 MCG tablet Take 88 mcg by mouth daily before breakfast.   Yes [provider]  midodrine (PROAMATINE) 10 MG tablet Take 1.5 tablets (15 mg total) by mouth 3 (three) times daily with meals. For orthostatic hypotension 06/15/23  Yes Lovorn, Aundra Millet, MD  Multiple Vitamin (MULTIVITAMIN WITH MINERALS) TABS tablet Take 1 tablet by mouth daily. 04/22/23  Yes Sunnie Nielsen, DO  mupirocin ointment (BACTROBAN) 2 % Place 1 Application into the nose 2 (  two) times daily for 60 doses. Use as directed 2 times daily for 5 days every other week for 6 weeks. 07/28/23 08/27/23 Yes Joan Flores, PA-C  pioglitazone (ACTOS) 30 MG tablet Take 30 mg by mouth daily.   Yes [provider]  Psyllium (METAMUCIL 3 IN 1 DAILY FIBER PO) Take 2 capsules by mouth daily.   Yes [provider]  rosuvastatin (CRESTOR) 5 MG tablet Take 5 mg by mouth at bedtime.   Yes [provider]  senna (SENOKOT) 8.6 MG TABS tablet Take 1 tablet (8.6 mg total) by mouth at bedtime. 05/11/23  Yes Setzer, Lynnell Jude, PA-C  Scheduled Meds:  acidophilus  2 capsule Oral TID   Chlorhexidine Gluconate Cloth  6 each Topical Daily   docusate  100 mg Per Tube BID   insulin aspart  0-20 Units Subcutaneous Q4H   ipratropium  0.5 mg Nebulization BID   levothyroxine  88 mcg Per Tube QAC breakfast   mouth rinse  15 mL Mouth Rinse 4 times per day   polyethylene glycol  17 g Per Tube Daily   sodium chloride flush  10-40 mL Intracatheter Q12H   sodium chloride flush  3-10 mL Intravenous Q12H   Continuous Infusions:  .TPN (CLINIMIX-E) Adult 84 mL/hr at 08/06/23 0705   albumin human 12.5 g (08/06/23 1034)   dextrose 50 mL/hr at 08/06/23 0705   doxycycline (VIBRAMYCIN) IV 125 mL/hr at 08/06/23 0705   phenylephrine (NEO-SYNEPHRINE) Adult infusion 15 mcg/min (08/06/23 0705)   tranexamic acid (CYKLOKAPRON) 2,500 mg in sodium chloride 0.9 % 250 mL (10 mg/mL) infusion 1.5 mg/kg/hr (08/06/23 0705)    PRN Meds:.alum & mag hydroxide-simeth, benzonatate, bisacodyl, menthol-cetylpyridinium **OR** [DISCONTINUED] phenol, senna-docusate, sodium chloride flush, sodium chloride flush  Active Hospital Problem list   See systems below  Assessment & Plan:  #Acute Hypoxic Respiratory Failure #HCAP #Post Op Atelectasis    Encephalopathy   - will dc cefepime   - metabolic component - IVF with LR   - HAGMA   #Cervical Myelopathy PMHx: Cervical stenosis, Cervical Spinal Cord Injury s/p fall s/p C2 dome laminectomy, C3-6 laminoplasty on 1/21 now complicated by Epidural Hematoma s/p surgical decompression 1/26 -Neurosurgery following, apprec ~ Follow recommendation as below: -Vasopressors as needed to maintain MAP goal >85 -Hold midodrine and Florinef while on pressors -Neuro checks q2h -PT/OT once stable -Foley catheter for urinary retention -Pain management (Fentanyl while intubated)   #HCAP Met SIRS criteria: tachycardia, sob, hypoxia CT chest shows Left lower lobe pneumonia -Monitor fever curve -Trend WBC's & Procalcitonin -Follow cultures as above -Continue empiric abx ppx pending cultures & sensitivities  #Acute Blood Loss Anemia Post Op #Hx of Blood Dyscrasia -Monitor for S/Sx of bleeding -Trend CBC (H&H q6h) -SCD's for VTE Prophylaxis (chemical ppx contraindicated) -Transfuse for Hgb <7  (has received 2 units pRBC's so far on 1/25)  #AKI on CKD stage III -Monitor I&O's / urinary output -Follow BMP -Ensure adequate renal perfusion -Avoid nephrotoxic agents as able -Replace electrolytes as indicated  #T2DM -HgbA1c goal  -CBG's q4; Target range of 140 to 180 -SSI -Follow ICU Hypo/Hyperglycemia protocol -Hold home Glipizide     #Hypothyroidism -Check TSH, Free T4 -continue home synthroid  Best practice:  Diet:  NPO Pain/Anxiety/Delirium protocol (if indicated): Yes (RASS goal -1) VAP protocol (if indicated): Yes DVT prophylaxis: Contraindicated GI  prophylaxis: H2B Glucose control:  SSI Yes Central venous access:  N/A Arterial line:  Yes, and it is still needed Foley:  Yes, and it  is still needed Mobility:  bed rest  PT consulted: N/A Last date of multidisciplinary goals of care discussion []  Code Status:  full code Disposition: ICU   = Goals of Care = Code Status Order: FULL  Primary Emergency Contact: Frey,Timothy S, Home Phone: [phone] Wishes to pursue full aggressive treatment and intervention options, including CPR and intubation, but goals of care will be addressed on going with family if that should become necessary.  Critical care provider statement:   Total critical care time: 33 minutes   Performed by: Karna Christmas MD   Critical care time was exclusive of separately billable procedures and treating other patients.   Critical care was necessary to treat or prevent imminent or life-threatening deterioration.   Critical care was time spent personally by me on the following activities: development of treatment plan with patient and/or surrogate as well as nursing, discussions with consultants, evaluation of patient's response to treatment, examination of patient, obtaining history from patient or surrogate, ordering and performing treatments and interventions, ordering and review of laboratory studies, ordering and review of radiographic studies, pulse oximetry and re-evaluation of patient's condition.    Vida Rigger, M.D.  Pulmonary & Critical Care Medicine

## 2023-08-06 NOTE — Progress Notes (Addendum)
PHARMACY - TOTAL PARENTERAL NUTRITION CONSULT NOTE   Indication: Unable to place NG tube for enteral nutrition   Patient Measurements: Height: 5\' 7"  (170.2 cm) Weight: 61.9 kg (136 lb 7.4 oz) IBW/kg (Calculated) : 61.6 TPN AdjBW (KG): 58.9 Body mass index is 21.37 kg/m.  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   Glucose / Insulin:  --BG 159 - 252 (SSI required 14u/24h) --on dexamethasone 4 mg IV once daily Electrolytes: hypernatremia, hypermagnesemia, hypophosphatemia Renal: SCr 1.42 > 0.83 Hepatic: LFTs wnl, bilirubin trending up Intake / Output; net (+) 1.9 L MIVF: none GI Imaging: none recent GI Surgeries / Procedures: none recent  Central access: 08/04/23 TPN start date: 08/04/23   RD Assessment: Estimated Needs Total Energy Estimated Needs: 1800-2100kcal/day Total Protein Estimated Needs: 90-105g/day Total Fluid Estimated Needs: 1.8-2.1L/day  Current Nutrition:  none  Plan:  ---continue E8/10 TPN at 61mL/hr  ---hold SMOF lipids ISO elevated triglycerides ---Electrolytes in TPN: Na 23mEq/L, K 45mEq/L, Ca 4.6mEq/L, Mg 12mEq/L, and Phos 68mmol/L. Cl:Ac 0.49 ---Add standard MVI, thiamine 100mg  daily x 5 days (day #3) and trace elements to TPN ---continue Sensitive q4h SSI and adjust as needed  + 5 units glargine SQ once daily  Monitor TPN labs on Mon/Thurs, daily until stable  Lowella Bandy 08/06/2023,7:14 AM

## 2023-08-06 NOTE — Progress Notes (Signed)
Neurosurgery Progress Note  History: [name] is here for C2 dome laminectomy, C3-6 laminoplasty and now s/p evacuation hematoma  POD 9/5: Pt more alert this morning asking what day it is  POD 8/4: Very drowsy but arouses this morning to voice.  POD 7/3: Pt is very drowsy, but able to follow some commands  POD6/2: Pt is drowsy this morning asking "what do you want" when awaken  Interval POD5: [name] had a decline yesterday with her left upper extremity and then bilateral lower extremities.  CT scan was unremarkable MRI did show a large epidural hematoma.  She was taken emergently to the OR by Dr. Katrinka Blazing for decompression.  She has been monitored in the ICU overnight. She is still intubated and on sedation. She is on pressors to maintain [facility]   Hospital Course POD4: [name] is more awake today. She states she isnt having a lot of pain but she is thirsty.  POD3:Patient more alert this AM. However; hypoxia, tachycardia, SOB, low energy.  POD2: Very lethargic and difficult to arouse, states in quite a bit of pain. Medicine had been held due to sleepiness.    Physical Exam: Vitals:   08/06/23 0645 08/06/23 0700  BP:  (!) 157/56  Pulse: 65 67  Resp: (!) 23 (!) 23  Temp: (!) 97.5 F (36.4 C) (!) 97.5 F (36.4 C)  SpO2: 97% 98%    More alert this morning with eyes opening spontaneously. Oriented to person and place. Asking what day it is.  Moves all extremities to pain but unable to cooperate with strength testing. Incision c/d/I with staples in place  Data:  Output by Drain (mL) 08/04/23 0701 - 08/04/23 1900 08/04/23 1901 - 08/05/23 0700 08/05/23 0701 - 08/05/23 1900 08/05/23 1901 - 08/06/23 0700 08/06/23 0701 - 08/06/23 1610  Requested LDAs do not have output data documented.     Assessment/Plan:  [name] POD6 s/p C2 dome laminectomy, C3-6 laminoplasty  On 1/21 with return to OR on 1/25 for cervical/thoracic laminectomy and epidural hematoma  evacuation  -Continue to monitor in ICU, recommend continued maintenance of MAP pressures above 80 mmHg. Will extend MAP management for a full 5 days post-op given the patients waxing and waning exam - pain control continue with IV and oral medications - Continue to hold DVT prophylaxis  - PT/OT when able - HV removed on 1/29 - PICC placement and TPN started.  - pneumonia - diagnosed 1/24 via CT, started on broad spectrum antibiotics. 1/27 CXR showing complete opacification of the left hemithorax with volume loss - Hem consulted. We appreciate your input. - CC and IM assisting with non surgical management. We appreciate your assistance.    Manning Charity PA-C Department of Neurosurgery

## 2023-08-07 ENCOUNTER — Inpatient Hospital Stay: Payer: Medicare Other

## 2023-08-07 DIAGNOSIS — Z7189 Other specified counseling: Secondary | ICD-10-CM | POA: Diagnosis not present

## 2023-08-07 DIAGNOSIS — G959 Disease of spinal cord, unspecified: Secondary | ICD-10-CM | POA: Diagnosis not present

## 2023-08-07 LAB — BLOOD GAS, ARTERIAL
Acid-base deficit: 16.6 mmol/L — ABNORMAL HIGH (ref 0.0–2.0)
Acid-base deficit: 11.2 mmol/L — ABNORMAL HIGH (ref 0.0–2.0)
Bicarbonate: 13.3 mmol/L — ABNORMAL LOW (ref 20.0–28.0)
Bicarbonate: 14.8 mmol/L — ABNORMAL LOW (ref 20.0–28.0)
FIO2: 0.6 %
MECHVT: 500 mL
O2 Saturation: 99.4 %
O2 Saturation: 98.9 %
PEEP: 8 cmH2O
Patient temperature: 37
Patient temperature: 37
RATE: 16 {breaths}/min
pCO2 arterial: 46 mm[Hg] (ref 32–48)
pCO2 arterial: 33 mm[Hg] (ref 32–48)
pH, Arterial: 7.07 — CL (ref 7.35–7.45)
pH, Arterial: 7.26 — ABNORMAL LOW (ref 7.35–7.45)
pO2, Arterial: 138 mm[Hg] — ABNORMAL HIGH (ref 83–108)
pO2, Arterial: 120 mm[Hg] — ABNORMAL HIGH (ref 83–108)

## 2023-08-07 LAB — GLUCOSE, CAPILLARY
Glucose-Capillary: 156 mg/dL — ABNORMAL HIGH (ref 70–99)
Glucose-Capillary: 175 mg/dL — ABNORMAL HIGH (ref 70–99)
Glucose-Capillary: 179 mg/dL — ABNORMAL HIGH (ref 70–99)
Glucose-Capillary: 186 mg/dL — ABNORMAL HIGH (ref 70–99)
Glucose-Capillary: 203 mg/dL — ABNORMAL HIGH (ref 70–99)
Glucose-Capillary: 224 mg/dL — ABNORMAL HIGH (ref 70–99)
Glucose-Capillary: 227 mg/dL — ABNORMAL HIGH (ref 70–99)

## 2023-08-07 LAB — BASIC METABOLIC PANEL
Anion gap: 9 (ref 5–15)
BUN: 78 mg/dL — ABNORMAL HIGH (ref 8–23)
CO2: 14 mmol/L — ABNORMAL LOW (ref 22–32)
Calcium: 9.1 mg/dL (ref 8.9–10.3)
Chloride: 126 mmol/L — ABNORMAL HIGH (ref 98–111)
Creatinine, Ser: 1.08 mg/dL — ABNORMAL HIGH (ref 0.44–1.00)
GFR, Estimated: 57 mL/min — ABNORMAL LOW (ref >60)
Glucose, Bld: 281 mg/dL — ABNORMAL HIGH (ref 70–99)
Potassium: 5.2 mmol/L — ABNORMAL HIGH (ref 3.5–5.1)
Sodium: 149 mmol/L — ABNORMAL HIGH (ref 135–145)

## 2023-08-07 LAB — CBC
HCT: 25.3 % — ABNORMAL LOW (ref 36.0–46.0)
Hemoglobin: 8.3 g/dL — ABNORMAL LOW (ref 12.0–15.0)
MCH: 29.4 pg (ref 26.0–34.0)
MCHC: 32.8 g/dL (ref 30.0–36.0)
MCV: 89.7 fL (ref 80.0–100.0)
Platelets: 194 10*3/uL (ref 150–400)
RBC: 2.82 MIL/uL — ABNORMAL LOW (ref 3.87–5.11)
RDW: 14.9 % (ref 11.5–15.5)
WBC: 12.4 10*3/uL — ABNORMAL HIGH (ref 4.0–10.5)
nRBC: 1.5 % — ABNORMAL HIGH (ref 0.0–0.2)

## 2023-08-07 LAB — BPAM RBC
Blood Product Expiration Date: 202503032359
ISSUE DATE / TIME: 202501301703
Unit Type and Rh: 6200

## 2023-08-07 LAB — TYPE AND SCREEN
ABO/RH(D): A POS
Antibody Screen: NEGATIVE
Unit division: 0

## 2023-08-07 LAB — RENAL FUNCTION PANEL
Albumin: 3 g/dL — ABNORMAL LOW (ref 3.5–5.0)
Anion gap: 10 (ref 5–15)
BUN: 71 mg/dL — ABNORMAL HIGH (ref 8–23)
CO2: 16 mmol/L — ABNORMAL LOW (ref 22–32)
Calcium: 8.9 mg/dL (ref 8.9–10.3)
Chloride: 127 mmol/L — ABNORMAL HIGH (ref 98–111)
Creatinine, Ser: 0.88 mg/dL (ref 0.44–1.00)
GFR, Estimated: >60 mL/min (ref >60)
Glucose, Bld: 214 mg/dL — ABNORMAL HIGH (ref 70–99)
Phosphorus: 3.3 mg/dL (ref 2.5–4.6)
Potassium: 4.5 mmol/L (ref 3.5–5.1)
Sodium: 153 mmol/L — ABNORMAL HIGH (ref 135–145)

## 2023-08-07 LAB — MAGNESIUM: Magnesium: 2.1 mg/dL (ref 1.7–2.4)

## 2023-08-07 LAB — HAPTOGLOBIN: Haptoglobin: 243 mg/dL (ref 37–355)

## 2023-08-07 LAB — HEMOGLOBIN AND HEMATOCRIT, BLOOD
HCT: 27.1 % — ABNORMAL LOW (ref 36.0–46.0)
Hemoglobin: 8.3 g/dL — ABNORMAL LOW (ref 12.0–15.0)

## 2023-08-07 LAB — TRIGLYCERIDES: Triglycerides: 242 mg/dL — ABNORMAL HIGH (ref <150)

## 2023-08-07 MED ORDER — ORAL CARE MOUTH RINSE
15.0000 mL | OROMUCOSAL | Status: DC
  Administered 2023-08-07 – 2023-08-21 (×158): 15 mL via OROMUCOSAL

## 2023-08-07 MED ORDER — ROCURONIUM BROMIDE 10 MG/ML (PF) SYRINGE
PREFILLED_SYRINGE | INTRAVENOUS | Status: AC
  Administered 2023-08-07: 50 mg via INTRAVENOUS
  Filled 2023-08-07: qty 10

## 2023-08-07 MED ORDER — METRONIDAZOLE 500 MG/100ML IV SOLN
500.0000 mg | Freq: Two times a day (BID) | INTRAVENOUS | Status: DC
  Administered 2023-08-07 – 2023-08-09 (×4): 500 mg via INTRAVENOUS
  Filled 2023-08-07 (×5): qty 100

## 2023-08-07 MED ORDER — PROPOFOL 1000 MG/100ML IV EMUL
INTRAVENOUS | Status: AC
  Administered 2023-08-07: 5 ug/kg/min via INTRAVENOUS
  Filled 2023-08-07: qty 100

## 2023-08-07 MED ORDER — SODIUM ZIRCONIUM CYCLOSILICATE 5 G PO PACK
5.0000 g | PACK | Freq: Once | ORAL | Status: AC
  Administered 2023-08-07: 5 g
  Filled 2023-08-07: qty 1

## 2023-08-07 MED ORDER — ORAL CARE MOUTH RINSE: 15.0000 mL | OROMUCOSAL | Status: DC | PRN

## 2023-08-07 MED ORDER — FENTANYL 2500MCG IN NS 250ML (10MCG/ML) PREMIX INFUSION
0.0000 ug/h | INTRAVENOUS | Status: DC
  Administered 2023-08-09: 25 ug/h via INTRAVENOUS
  Filled 2023-08-07: qty 250

## 2023-08-07 MED ORDER — PROPOFOL 1000 MG/100ML IV EMUL
5.0000 ug/kg/min | INTRAVENOUS | Status: DC
  Administered 2023-08-08: 30 ug/kg/min via INTRAVENOUS
  Administered 2023-08-09: 25 ug/kg/min via INTRAVENOUS
  Administered 2023-08-09: 35 ug/kg/min via INTRAVENOUS
  Administered 2023-08-09: 5 ug/kg/min via INTRAVENOUS
  Filled 2023-08-07 (×6): qty 100

## 2023-08-07 MED ORDER — PROPOFOL 1000 MG/100ML IV EMUL
INTRAVENOUS | Status: AC
  Administered 2023-08-07: 30 ug/kg/min via INTRAVENOUS
  Filled 2023-08-07: qty 100

## 2023-08-07 MED ORDER — SODIUM CHLORIDE 0.9 % IV SOLN
1.0000 g | INTRAVENOUS | Status: DC
  Administered 2023-08-07 – 2023-08-09 (×3): 1 g via INTRAVENOUS
  Filled 2023-08-07 (×5): qty 10

## 2023-08-07 MED ORDER — FENTANYL 2500MCG IN NS 250ML (10MCG/ML) PREMIX INFUSION
INTRAVENOUS | Status: AC
  Administered 2023-08-07: 25 ug/h via INTRAVENOUS
  Filled 2023-08-07: qty 250

## 2023-08-07 MED ORDER — SODIUM CHLORIDE 0.9 % IV SOLN: 250.0000 mL | INTRAVENOUS | Status: DC

## 2023-08-07 MED ORDER — FENTANYL CITRATE (PF) 100 MCG/2ML IJ SOLN
INTRAMUSCULAR | Status: AC
  Administered 2023-08-07: 150 ug via INTRAVENOUS
  Filled 2023-08-07: qty 4

## 2023-08-07 MED ORDER — MUPIROCIN 2 % EX OINT
1.0000 | TOPICAL_OINTMENT | Freq: Two times a day (BID) | CUTANEOUS | Status: AC
  Administered 2023-08-07 – 2023-08-12 (×10): 1 via NASAL
  Filled 2023-08-07: qty 22

## 2023-08-07 MED ORDER — SODIUM BICARBONATE 8.4 % IV SOLN
25.0000 meq | Freq: Once | INTRAVENOUS | Status: AC
  Administered 2023-08-07: 25 meq via INTRAVENOUS
  Filled 2023-08-07: qty 50

## 2023-08-07 MED ORDER — TRACE MINERALS CU-MN-SE-ZN 300-55-60-3000 MCG/ML IV SOLN
INTRAVENOUS | Status: AC
  Filled 2023-08-07: qty 2000

## 2023-08-07 MED ORDER — ENOXAPARIN SODIUM 40 MG/0.4ML IJ SOSY
40.0000 mg | PREFILLED_SYRINGE | INTRAMUSCULAR | Status: DC
  Administered 2023-08-07 – 2023-08-09 (×3): 40 mg via SUBCUTANEOUS
  Filled 2023-08-07 (×3): qty 0.4

## 2023-08-07 MED ORDER — NOREPINEPHRINE 4 MG/250ML-% IV SOLN
INTRAVENOUS | Status: AC
  Administered 2023-08-07: 7 ug/min via INTRAVENOUS
  Filled 2023-08-07: qty 250

## 2023-08-07 MED ORDER — ROCURONIUM BROMIDE 10 MG/ML (PF) SYRINGE: 50.0000 mg | PREFILLED_SYRINGE | Freq: Once | INTRAVENOUS | Status: AC

## 2023-08-07 MED ORDER — FAT EMUL FISH OIL/PLANT BASED 20% (SMOFLIPID)IV EMUL
250.0000 mL | INTRAVENOUS | Status: AC
  Administered 2023-08-07: 250 mL via INTRAVENOUS
  Filled 2023-08-07: qty 250

## 2023-08-07 MED ORDER — FENTANYL CITRATE (PF) 100 MCG/2ML IJ SOLN: 150.0000 ug | Freq: Once | INTRAMUSCULAR | Status: AC

## 2023-08-07 MED ORDER — NOREPINEPHRINE 4 MG/250ML-% IV SOLN: 2.0000 ug/min | INTRAVENOUS | Status: DC

## 2023-08-07 MED ORDER — DESMOPRESSIN ACETATE 4 MCG/ML IJ SOLN
2.0000 ug | Freq: Once | INTRAMUSCULAR | Status: AC
  Administered 2023-08-07: 2 ug via INTRAVENOUS
  Filled 2023-08-07: qty 1

## 2023-08-07 NOTE — Progress Notes (Addendum)
Nutrition Follow Up Note   DOCUMENTATION CODES:   Non-severe (moderate) malnutrition in context of chronic illness  INTERVENTION:   Recommend G-tube placement and nutrition support if plan is for full aggressive care.   Continue TPN per pharmacy   Pt remains at high refeed risk; recommend monitor potassium, magnesium and phosphorus labs daily until stable  Daily weights   NUTRITION DIAGNOSIS:   Moderate Malnutrition related to chronic illness as evidenced by mild fat depletion, moderate fat depletion, mild muscle depletion, severe muscle depletion. 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  Pt with worsening hypoxemia requiring intubation and bronchoscopy today. TPN initiated 1/28; pt remains on TPN. Triglycerides improved with improvement of hyperglycemia. Refeed labs stabilizing. Hypernatremia improved. Pt with worsening AKI. Pt unable to have NGT placed secondary to esophageal stenosis. Pt will likely need G-tube placement if full care continued so TPN can be weaned. Per chart, pt is up ~6lbs since admission. Pt +3.6L on her I & Os. Pt s/p type 1 BM yesterday; pt is receiving bowel regimen.    Medications reviewed and include: risaquad, colace, insulin, synthroid, miralax, lokelma, albumin, ceftriaxone, 5% dextrose @30ml /hr, doxycycline, metronidazole, levophed, neo-synephrine   Labs reviewed: Na 149(H), K 5.2(H), BUN 78(H), creat 1.08(H), P 3.3  wnl, Mg 2.1 wnl Triglycerides- 242(H) Wbc- 12.4(H), Hgb 8.3(L), Hct 25.3(L) Cbgs- 224, 175, 203, 179 x 24 hrs  Patient is currently intubated on ventilator support MV: 9.4 L/min Temp (24hrs), Avg:99.3 F (37.4 C), Min:98.8 F (37.1 C), Max:100 F (37.8 C)  MAP- >64mmHg   UOP-   Drains- removed 1/29  Diet Order:   Diet Order     None      EDUCATION NEEDS:   Not appropriate for education at this time  Skin:  Skin Assessment: Reviewed RN Assessment (incision back and neck)  Last BM:  1/30- type 1  Height:   Ht Readings from Last 1 Encounters:  07/28/23 5\' 7"  (1.702 m)    Weight:   Wt Readings from Last 1 Encounters:  08/07/23 62.1 kg    Ideal Body Weight:  61.36 kg  BMI:  Body mass index is 21.44 kg/m.  Estimated Nutritional Needs:   Kcal:  1800-2100kcal/day  Protein:  95-110g/day  Fluid:  1.8-2.1L/day  Betsey Holiday MS, RD, LDN If unable to be reached, please send secure chat to "RD inpatient" available from 8:00a-4:00p daily

## 2023-08-07 NOTE — Plan of Care (Signed)
   Problem: Safety: Goal: Ability to remain free from injury will improve Outcome: Progressing   Problem: Skin Integrity: Goal: Risk for impaired skin integrity will decrease Outcome: Progressing

## 2023-08-07 NOTE — TOC Progression Note (Signed)
Transition of Care [facility]) - Progression Note    Patient Details  Name: [name] MRN: [id number] Date of Birth: 01-Aug-1955  [facility] of [facility]) CM/SW Contact  Margarito Liner, LCSW Phone Number: 08/07/2023, 1:17 PM  Clinical Narrative: CSW continues to follow progress. Patient is now intubated.    Expected Discharge Plan: Skilled Nursing Facility Barriers to Discharge: SNF Pending bed offer, Insurance Authorization  Expected Discharge Plan and Services   Discharge Planning Services: CM Consult   Living arrangements for the past 2 months: Single Family Home                   DME Agency: NA       HH Arranged: NA           Social Determinants of Health (SDOH) Interventions SDOH Screenings   Food Insecurity: No Food Insecurity (07/28/2023)  Housing: Low Risk  (07/28/2023)  Transportation Needs: No Transportation Needs (07/28/2023)  Utilities: Not At Risk (07/28/2023)  Depression (PHQ2-9): Low Risk  (06/15/2023)  Social Connections: Moderately Integrated (07/28/2023)  Tobacco Use: Low Risk  (07/28/2023)    Readmission Risk Interventions     No data to display

## 2023-08-07 NOTE — Progress Notes (Addendum)
Daily Progress Note   Patient Name: [name]       Date: 08/07/2023 DOB: Sep 28, 1955  Age: 68 y.o. MRN#: [id number] Attending Physician: [name], MD Primary Care Physician: Jerl Mina, MD Admit Date: 07/28/2023  Reason for Consultation/Follow-up: Establishing goals of care  Subjective: Notes and labs reviewed. Patient resting in bed on ventilator. No family at bedside. Discussed case with CCM. Patient had bronchoscopy and was intubated this morning. Per CCM, patient's status is improving. NSU and hematology following.    Attempted to call husband to offer support unsuccessfully.     Length of Stay: 10  Current Medications: Scheduled Meds:   sodium chloride   Intravenous Once   Chlorhexidine Gluconate Cloth  6 each Topical Daily   docusate  100 mg Per Tube BID   insulin aspart  0-20 Units Subcutaneous Q4H   insulin aspart  3 Units Subcutaneous Q4H   insulin glargine-yfgn  5 Units Subcutaneous Daily   levothyroxine  88 mcg Per Tube QAC breakfast   mouth rinse  15 mL Mouth Rinse 4 times per day   polyethylene glycol  17 g Per Tube Daily   sodium chloride flush  10-40 mL Intracatheter Q12H   sodium chloride flush  3-10 mL Intravenous Q12H   sodium zirconium cyclosilicate  5 g Per Tube Once    Continuous Infusions:  .TPN (CLINIMIX-E) Adult 84 mL/hr at 08/07/23 0700   sodium chloride Stopped (08/07/23 1240)   albumin human 12.5 g (08/07/23 1302)   cefTRIAXone (ROCEPHIN)  IV 1 g (08/07/23 1456)   dextrose 30 mL/hr at 08/07/23 1307   doxycycline (VIBRAMYCIN) IV 125 mL/hr at 08/07/23 0700   TPN (CLINIMIX) Adult without lytes     And   fat emul(SMOFlipid)     fentaNYL infusion INTRAVENOUS 25 mcg/hr (08/07/23 1246)   metronidazole     norepinephrine (LEVOPHED)  Adult infusion 7 mcg/min (08/07/23 1207)   phenylephrine (NEO-SYNEPHRINE) Adult infusion 80 mcg/min (08/07/23 1412)    PRN Meds: alum & mag hydroxide-simeth, benzonatate, bisacodyl, menthol-cetylpyridinium **OR** [DISCONTINUED] phenol, senna-docusate, sodium chloride flush, sodium chloride flush  Physical Exam Constitutional:      Comments: Eyes closed.  Pulmonary:     Comments: On ventilator.             Vital Signs: BP 133/68  Pulse (!) 103   Temp 99.9 F (37.7 C)   Resp (!) 22   Ht 5\' 7"  (1.702 m)   Wt 62.1 kg   SpO2 98%   BMI 21.44 kg/m  SpO2: SpO2: 98 % O2 Device: O2 Device: Ventilator O2 Flow Rate: O2 Flow Rate (L/min): 6 L/min  Intake/output summary:  Intake/Output Summary (Last 24 hours) at 08/07/2023 1542 Last data filed at 08/07/2023 1244 Gross per 24 hour  Intake 3348.34 ml  Output 2350 ml  Net 998.34 ml   LBM: Last BM Date : 08/06/23 Baseline Weight: Weight: 58.9 kg Most recent weight: Weight: 62.1 kg   Patient Active Problem List   Diagnosis Date Noted   Myelopathy of thoracic region 08/04/2023   Hematoma 08/04/2023   Spinal stenosis, thoracic 08/04/2023   Malnutrition of moderate degree 08/03/2023   Lobar pneumonia (HCC) 08/01/2023   MRSA (methicillin resistant staph aureus) culture positive 08/01/2023   Cervical myelopathy (HCC) 07/28/2023   Spinal stenosis in cervical region 06/18/2023   Spinal cord injury at C5-C7 level without injury of spinal bone (HCC) 06/18/2023   Arm weakness 06/18/2023   Numbness and tingling 06/18/2023   Spasticity 06/15/2023   Orthostatic hypotension 06/15/2023   Esophageal stricture 05/01/2023   Dysphagia 04/30/2023   Odynophagia 04/30/2023   Gastroesophageal reflux disease 04/30/2023   Acute incomplete quadriplegia (HCC) 04/23/2023   Spinal cord injury, cervical region (HCC) 04/22/2023   Spinal cord compression, post-traumatic (HCC) 04/13/2023   Central cord syndrome (HCC) 04/13/2023   Muscle weakness of right  upper extremity 04/13/2023   Weakness of right lower extremity 04/13/2023   Pelvic fracture (HCC) 04/13/2023   Cellulitis 08/12/2021   Facial cellulitis 08/11/2021   Dental caries 08/11/2021   COVID-19 virus infection 08/11/2021   Diabetes mellitus without complication (HCC)    Hypothyroidism    Stage 3a chronic kidney disease (HCC)    Non-dose-related adverse reaction to medication    Post-operative state 03/16/2017   AKI (acute kidney injury) (HCC) 01/18/2017   Acute respiratory failure with hypoxia (HCC) 11/04/2015    Palliative Care Assessment & Plan    Recommendations/Plan:  PMT will shadow for decline.     Code Status:    Code Status Orders  (From admission, onward)           Start     Ordered   07/28/23 1459  Full code  Continuous       Question:  By:  Answer:  Procedural case: previous code status reviewed   07/28/23 1458           Code Status History     Date Active Date Inactive Code Status Order ID Comments User Context   04/22/2023 1239 05/12/2023 1531 Full Code [id number]  Milinda Antis, PA-C Inpatient   04/22/2023 1239 04/22/2023 1239 Full Code [id number]  Milinda Antis, PA-C Inpatient   04/13/2023 1640 04/22/2023 1237 Full Code [id number]  Lilia Pro, MD ED   04/13/2023 1629 04/13/2023 1640 Full Code [id number]  Erin Fulling, MD ED   08/11/2021 2222 08/16/2021 1950 Full Code [id number]  Andris Baumann, MD ED   03/16/2017 1059 03/17/2017 1935 Full Code [id number]  Schermerhorn, Ihor Austin, MD Inpatient   01/18/2017 1745 01/19/2017 2033 Full Code [id number]  Enedina Finner, MD ED   11/04/2015 1219 11/06/2015 2047 Full Code [id number]  Katha Hamming, MD ED       Care plan was discussed with CCM  Thank you for allowing the Palliative Medicine Team  to assist in the care of this patient.  Morton Stall, NP  Please contact Palliative Medicine Team phone at [phone] for questions and concerns.

## 2023-08-07 NOTE — Progress Notes (Signed)
NAME:  [name], MRN:  [id number], DOB:  Sep 04, 1955, LOS: 10 ADMISSION DATE:  07/28/2023, CONSULTATION DATE:  08/02/23 REFERRING MD:  Ernestine Mcmurray CHIEF COMPLAINT:  cervical myelopathy  HPI  [name] is a 68 year old old female with PMHx of hypothyroidism, insulin-dependent DM, CKD stage III, dysphagia with esophageal strictures, facial cellulitis, GERD, neuropathy, arthritis, blood dyscrasia, and recent traumatic cervical central cord injury who presented to the hospital for elective cervical spine surgery.  Per patient's chart, patient was seen by neurosurgery on 07/28/2023 for follow-up after suffering a fall 3 months ago in which she sustained a cervical spinal cord injury.  Per patient, they deferred emergent surgical depression and wanted to let her have time to recover and work with physical therapy.  She continued to have hand weakness as well as lower extremity weakness and ambulatory issues.  Given the ongoing compression of C3-C6, laminoplasty and C2 dome laminectomy was indicated.    Hospital course: Patient underwent C3-6 laminoplasty and C2 dome and laminectomy surgery on 07/29/2023 with drain placement.   Pertinent Labs/Diagnostics Findings: Na+/ K+: 141/4.4 Glucose: 136 BUN/Cr.47/1.42 WBC: 5.8 K/L  Hgb/Hct: 7.4/22.4  Past Medical History  Hypothyroidism, insulin-dependent DM, CKD stage III, dysphagia with esophageal strictures, facial cellulitis, GERD, neuropathy, arthritis, blood dyscrasia, and recent traumatic cervical central cord injury  Significant Hospital Events   01/21: Patient underwent posterior C3-6 Cervical Laminoplasty with C2 inferior dome Laminectomy 01/22: POD #1. Pt. stable with no acute changes.  Working with PT and OT 01/23: POD #2.  Pt. noted to be very lethargic and difficult to arouse 01/24: POD #3. Pt. developed hypoxia tachycardia and shortness of breath.  Hospitalist consulted CT chest obtained and showed possible pneumonia.  Started on IV  antibiotics and DuoNebs. 01/25: POD #4. Pt. with new concerns of not moving the left arm.  CT head and cervical spine obtained with no new concerns.  Follow-up MRI however demonstrated epidural hematoma extending from the cervical spine to the lower thoracic spine with acute compression and T2 signal.  Taken emergently to the OR for surgical decompression 01/26: Pt. transferred to the ICU s/p emergent cervical decompression and evacuation of epidural hematoma.  Remained intubated. PCCM consulted 08/03/23- s/p surgery overnight, alert to self but drowsy with mild AKI and metabolic acidosis.  Lethargy noted have dcd cefepime and started doxycycline instead.  08/04/23- patient more alert able to wiggle toes to verbal.  Remains critically ill.  08/05/23- patient appears to have hypoactive delerium with waxing and waning sensorium.  At times she asks for things other times she's poorly responsive.  Her oxygenation has improved with nasal canula 5L/min.  S/p heme/onc evaluation today 08/06/23- patient seen at bedside, she was able to whisper few words tome.  I discussed PO intake with speech pathologist and SLP with barium was ordered however patient unable to complete this study. She is still very weak physically barely able to wiggle toes.  H/h <7 s/p prbc.  Renal function is improved.  08/07/23- patient had worsening hypoxemia with CXR showing worsening complete left opacification. I discussed this with neurosurgery and they stated her cervical spine did not have any new hardware so intubation should not be complicated. I met with PA Manning Charity at bedside  to review care plan. We discussed these findings with husband and proceeded with intubation and bronchoscopy.  The entire left lung appeared completely full of mucus impaction in both upper and lower lobes during bronchoscopy.  CXR post bronchoscopy is in process.  Consults:  PCCM Neurosurgery Hospitalist  Procedures:  1/21:posterior C3-6 Cervical  Laminoplasty with C2 inferior dome Laminectomy 1/25: s/p Epidural Hematoma Decompression 1/25: Intubation 1/25: Arterial Line  Significant Diagnostic Tests:  1/25:MRI  Thoracic and Cervical Spine> IMPRESSION: 1. Postoperative changes from recent C3 through C6 laminectomy with laminoplasty. Associated fractures better appreciated on prior CT. 2. Dorsal epidural collection extending from C2-3 through T9, likely epidural hematoma. Resultant severe diffuse spinal stenoses stenosis with cord compression at these levels. 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 Emergent neuro surgical 3. Underlying multilevel cervical spondylosis with resultant multilevel foraminal narrowing as above. Notable findings include severe left C4 foraminal stenosis, moderate bilateral C5 foraminal narrowing, with mild bilateral C6 and right C7 foraminal stenosis.  1/25: Noncontrast CT Head &Cervical Spine> IMPRESSION: 1. No acute intracranial abnormality. 2. Mild cortical atrophy, within normal limits for patient age. 3. Improved paranasal sinus mucosal opacification as described above.  1/24: CTA Chest> IMPRESSION: Left lower lobe pneumonia without sizable effusion.   Micro Data:  1/24: Blood culture x2> 1/24: MRSA PCR>> positive 1/24: Strep pneumo urinary antigen>  Antimicrobials:  1/24 Vancomycin >>  1/24 Cefepime >>  OBJECTIVE  Blood pressure (!) 162/54, pulse 67, temperature 98.8 F (37.1 C), temperature source Rectal, resp. rate (!) 25, height 5\' 7"  (1.702 m), weight 62.1 kg, SpO2 97%.  Vent Mode: PRVC FiO2 (%):  [60 %-100 %] 60 % Set Rate:  [16 bmp] 16 bmp Vt Set:  [500 mL] 500 mL PEEP:  [8 cmH20] 8 cmH20 Plateau Pressure:  [20 cmH20] 20 cmH20   Intake/Output Summary (Last 24 hours) at 08/07/2023 1108 Last data filed at 08/07/2023 0825 Gross per 24 hour  Intake 4700.35 ml  Output 2600 ml  Net 2100.35 ml    Filed Weights   08/05/23 0500 08/06/23 0500 08/07/23 0351  Weight: 60.4 kg 61.9 kg 62.1 kg   Physical Examination  GENERAL: lying down in bed, no apparent distress -encephalopathy noted CVS: RRR, no carotid bruit. LUNGS: decreased air entry on left with rhonchi ABDOMEN: Soft, NTTP EXTREMITIES: no edema or cyanosis NEURO: MENTAL STATUS: sedated on propofol LANG/SPEECH: non-verbal (sedated) CRANIAL NERVES: Pupils are equal and reactive, face symmetric - poor cough and gag to suctioning, rest of cranial nerves were deferred due to sedation. MOTOR: no spontaneous movements - no withdrawal to pain on either side (sedated) REFLEXES: hyporeflexic bilaterally SENSORY: no reaction to pain in both sides GAIT: deferred -  SKIN: intact except as below:      Labs/imaging that I havepersonally reviewed  (right click and "Reselect all SmartList Selections" daily)     Labs   CBC: Recent Labs  Lab 08/03/23 0450 08/04/23 0401 08/05/23 0403 08/06/23 0348 08/06/23 1343 08/06/23 2202 08/07/23 0346  WBC 9.8 10.1 9.9 10.9*  --   --  12.4*  HGB 8.3* 7.6* 7.2* 6.8* 6.6* 8.5* 8.3*  HCT 25.1* 23.4* 20.9* 20.1* 20.5* 26.3* 25.3*  MCV 88.4 89.0 89.3 90.1  --   --  89.7  PLT 216 238 235 224  --   --  194    Basic Metabolic Panel: Recent Labs  Lab 08/03/23 0450 08/04/23 0401 08/05/23 0403 08/06/23 0348 08/07/23 0346  NA 142 148* 153* 154* 153*  K 4.1 4.2 4.1 3.8 4.5  CL 113* 119* 125* 128* 127*  CO2 17* 15* 18* 19* 16*  GLUCOSE 114* 144* 181* 241* 214*  BUN 50* 50* 57* 62* 71*  CREATININE  1.13* 1.06* 1.02* 0.83 0.88  CALCIUM 7.9* 8.3* 8.6* 9.1 8.9  MG  --  2.3 2.8* 2.4 2.1  PHOS  --  2.6 2.2* 2.2* 3.3   GFR: Estimated Creatinine Clearance: 62 mL/min (by C-G formula based on SCr of 0.88 mg/dL). Recent Labs  Lab 07/31/23 1457 07/31/23 1731 08/01/23 0423 08/02/23 0315 08/03/23 0450 08/03/23 1307 08/04/23 0401 08/05/23 0403 08/06/23 0348 08/07/23 0346  WBC 6.7  --    <  > 5.2   < >  --  10.1 9.9 10.9* 12.4*  LATICACIDVEN 1.2 1.8  --  1.8  --  0.9  --   --   --   --    < > = values in this interval not displayed.    Liver Function Tests: Recent Labs  Lab 08/02/23 0315 08/03/23 0450 08/04/23 0401 08/05/23 0403 08/06/23 0348 08/07/23 0346  AST 26 22 21 19 20   --   ALT 13 16 15 15 16   --   ALKPHOS 48 51 51 46 49  --   BILITOT 1.0 0.9 1.5* 1.0 0.7  --   PROT 5.8* 5.8* 6.0* 5.7* 6.1*  --   ALBUMIN 2.7* 2.6* 2.8* 2.9* 3.2* 3.0*   No results for input(s): "LIPASE", "AMYLASE" in the last 168 hours. No results for input(s): "AMMONIA" in the last 168 hours.  ABG    Component Value Date/Time   PHART 7.35 08/03/2023 1502   PCO2ART 29 (L) 08/03/2023 1502   PO2ART 66 (L) 08/03/2023 1502   HCO3 16.0 (L) 08/03/2023 1502   ACIDBASEDEF 8.2 (H) 08/03/2023 1502   O2SAT 92.5 08/03/2023 1502     Coagulation Profile: Recent Labs  Lab 08/01/23 2005 08/05/23 1811  INR 1.3* 1.2    Cardiac Enzymes: No results for input(s): "CKTOTAL", "CKMB", "CKMBINDEX", "TROPONINI" in the last 168 hours.  HbA1C: Hemoglobin A1C  Date/Time Value Ref Range Status  02/20/2013 04:02 AM 10.3 (H) 4.2 - 6.3 % Final    Comment:    The American Diabetes Association recommends that a primary goal of therapy should be <7% and that physicians should reevaluate the treatment regimen in patients with HbA1c values consistently >8%.    Hgb A1c MFr Bld  Date/Time Value Ref Range Status  04/13/2023 11:34 AM 5.9 (H) 4.8 - 5.6 % Final    Comment:    (NOTE) Pre diabetes:          5.7%-6.4%  Diabetes:              >6.4%  Glycemic control for   <7.0% adults with diabetes   08/11/2021 08:39 PM 7.2 (H) 4.8 - 5.6 % Final    Comment:    (NOTE) Pre diabetes:          5.7%-6.4%  Diabetes:              >6.4%  Glycemic control for   <7.0% adults with diabetes    CBG: Recent Labs  Lab 08/06/23 1457 08/06/23 2002 08/07/23 0013 08/07/23 0341 08/07/23 0720  GLUCAP 186* 236*  179* 203* 175*    Review of Systems:   Unable to be obtained secondary to the patient's encephalopathy  Past Medical History  She,  has a past medical history of Acute incomplete quadriplegia (HCC), Acute respiratory failure with hypoxia (HCC), AKI (acute kidney injury) (HCC), Arthritis, Blood dyscrasia, Central cord syndrome (HCC), Dental caries, Diabetes mellitus without complication (HCC), Diverticulosis, Dysphagia, Esophageal stricture, Facial cellulitis, GERD (gastroesophageal reflux disease), Hypothyroidism, Neuropathy, Odynophagia, Pelvic  fracture [facility]), Renal insufficiency, Spinal cord compression, post-traumatic (HCC), Spinal cord injury, cervical region [facility]), and Stage 3a chronic kidney disease (CKD) (HCC).   Surgical History    Past Surgical History:  Procedure Laterality Date   ABDOMINAL HYSTERECTOMY     BILATERAL SALPINGECTOMY Bilateral 03/16/2017   Procedure: BILATERAL SALPINGECTOMY;  Surgeon: Schermerhorn, Ihor Austin, MD;  Location: [facility] ORS;  Service: Gynecology;  Laterality: Bilateral;   BIOPSY  05/03/2023   Procedure: BIOPSY;  Surgeon: Benancio Deeds, MD;  Location: [facility] ENDOSCOPY;  Service: Gastroenterology;;   COLONOSCOPY WITH PROPOFOL N/A 08/18/2018   Procedure: COLONOSCOPY WITH PROPOFOL;  Surgeon: Scot Jun, MD;  Location: [facility] ENDOSCOPY;  Service: Endoscopy;  Laterality: N/A;   CYSTOCELE REPAIR N/A 03/16/2017   Procedure: ANTERIOR REPAIR (CYSTOCELE);  Surgeon: Schermerhorn, Ihor Austin, MD;  Location: [facility] ORS;  Service: Gynecology;  Laterality: N/A;   ESOPHAGOGASTRODUODENOSCOPY (EGD) WITH PROPOFOL N/A 08/18/2018   Procedure: ESOPHAGOGASTRODUODENOSCOPY (EGD) WITH PROPOFOL;  Surgeon: Scot Jun, MD;  Location: [facility] ENDOSCOPY;  Service: Endoscopy;  Laterality: N/A;   ESOPHAGOGASTRODUODENOSCOPY (EGD) WITH PROPOFOL Left 05/01/2023   Procedure: ESOPHAGOGASTRODUODENOSCOPY (EGD) WITH PROPOFOL;  Surgeon: Benancio Deeds, MD;  Location[facility] END[facility]COPY;  Service:  Gastroenterology;  Laterality: Left;   ESOPHAGOGASTRODUODENOSCOPY (EGD) WITH PROPOFOL N/A 05/03/2023   Procedure: ESOPHAGOGASTRODUODENOSCOPY (EGD) WITH PROPOFOL;  Surgeon: Benancio Deeds, MD;  Location: [facility] ENDOSCOPY;  Service: Gastroenterology;  Laterality: N/A;   POSTERIOR CERVICAL LAMINECTOMY N/A 07/28/2023   Procedure: C2 DOME LAMINECTOMY;  Surgeon: [name], MD;  Location: [facility] ORS;  Service: Neurosurgery;  Laterality: N/A;   POSTERIOR CERVICAL LAMINECTOMY N/A 08/01/2023   Procedure: POSTERIOR CERVICAL LAMINECTOMY;  Surgeon: [name], MD;  Location: [facility] ORS;  Service: Neurosurgery;  Laterality: N/A;   TEAR DUCT PROBING Right    TUBAL LIGATION     VAGINAL HYSTERECTOMY N/A 03/16/2017   Procedure: HYSTERECTOMY VAGINAL;  Surgeon: Schermerhorn, Ihor Austin, MD;  Location: [facility] ORS;  Service: Gynecology;  Laterality: N/A;   WISDOM TOOTH EXTRACTION       Social History   reports that she has never smoked. She has never used smokeless tobacco. She reports that she does not drink alcohol and does not use drugs.   Family History   Her family history includes Breast cancer in her maternal aunt; Diabetes in her father; Hypertension in her father and mother.   Allergies Allergies  Allergen Reactions   Amoxicillin-Pot Clavulanate Itching and Anaphylaxis   Ampicillin Rash     Home Medications  Prior to Admission medications   Medication Sig Start Date End Date Taking? Authorizing Provider  acetaminophen (TYLENOL) 650 MG CR tablet Take 650 mg by mouth every 8 (eight) hours as needed for pain.   Yes [provider]  chlorhexidine (HIBICLENS) 4 % external liquid Apply 15 mLs (1 Application total) topically as directed for 30 doses. Use as directed daily for 5 days every other week for 6 weeks. 07/28/23  Yes Joan Flores, PA-C  cyanocobalamin (VITAMIN B12) 1000 MCG tablet Take 1,000 mcg by mouth daily.   Yes [provider]  cyclobenzaprine (FLEXERIL) 10 MG  tablet Take 1 tablet (10 mg total) by mouth at bedtime. 06/15/23  Yes Lovorn, Aundra Millet, MD  diclofenac Sodium (VOLTAREN) 1 % GEL Apply 2 g topically 4 (four) times daily. Patient taking differently: Apply 2 g topically 4 (four) times daily as needed (pain). 05/11/23  Yes Setzer, Lynnell Jude, PA-C  fludrocortisone (FLORINEF) 0.1 MG tablet Take 2 tablets (0.2 mg  total) by mouth daily. For orthostatic hypotension 06/15/23  Yes Lovorn, Aundra Millet, MD  gabapentin (NEURONTIN) 100 MG capsule Take 200 mg by mouth 2 (two) times daily.  08/16/15  Yes [provider]  glipiZIDE (GLUCOTROL) 5 MG tablet Take 10 mg by mouth daily before breakfast.   Yes [provider]  JARDIANCE 25 MG TABS tablet Take 25 mg by mouth daily.   Yes [provider]  lactase (LACTAID) 3000 units tablet Take 3,000 Units by mouth daily before breakfast.   Yes [provider]  levothyroxine (SYNTHROID) 88 MCG tablet Take 88 mcg by mouth daily before breakfast.   Yes [provider]  midodrine (PROAMATINE) 10 MG tablet Take 1.5 tablets (15 mg total) by mouth 3 (three) times daily with meals. For orthostatic hypotension 06/15/23  Yes Lovorn, Aundra Millet, MD  Multiple Vitamin (MULTIVITAMIN WITH MINERALS) TABS tablet Take 1 tablet by mouth daily. 04/22/23  Yes Sunnie Nielsen, DO  mupirocin ointment (BACTROBAN) 2 % Place 1 Application into the nose 2 (two) times daily for 60 doses. Use as directed 2 times daily for 5 days every other week for 6 weeks. 07/28/23 08/27/23 Yes Joan Flores, PA-C  pioglitazone (ACTOS) 30 MG tablet Take 30 mg by mouth daily.   Yes [provider]  Psyllium (METAMUCIL 3 IN 1 DAILY FIBER PO) Take 2 capsules by mouth daily.   Yes [provider]  rosuvastatin (CRESTOR) 5 MG tablet Take 5 mg by mouth at bedtime.   Yes [provider]  senna (SENOKOT) 8.6 MG TABS tablet Take 1 tablet (8.6 mg total) by mouth at bedtime. 05/11/23  Yes Setzer, Lynnell Jude, PA-C  Scheduled  Meds:  sodium chloride   Intravenous Once   acidophilus  2 capsule Oral TID   Chlorhexidine Gluconate Cloth  6 each Topical Daily   docusate  100 mg Per Tube BID   insulin aspart  0-20 Units Subcutaneous Q4H   insulin aspart  3 Units Subcutaneous Q4H   insulin glargine-yfgn  5 Units Subcutaneous Daily   ipratropium  0.5 mg Nebulization BID   levothyroxine  88 mcg Per Tube QAC breakfast   mouth rinse  15 mL Mouth Rinse 4 times per day   polyethylene glycol  17 g Per Tube Daily   sodium chloride flush  10-40 mL Intracatheter Q12H   sodium chloride flush  3-10 mL Intravenous Q12H   Continuous Infusions:  .TPN (CLINIMIX-E) Adult 84 mL/hr at 08/07/23 0700   sodium chloride     albumin human Stopped (08/06/23 1124)   dextrose 50 mL/hr at 08/07/23 0700   doxycycline (VIBRAMYCIN) IV 125 mL/hr at 08/07/23 0700   TPN (CLINIMIX) Adult without lytes     And   fat emul(SMOFlipid)     fentaNYL     fentaNYL infusion INTRAVENOUS     norepinephrine     norepinephrine (LEVOPHED) Adult infusion     phenylephrine (NEO-SYNEPHRINE) Adult infusion 30 mcg/min (08/07/23 0700)   PRN Meds:.alum & mag hydroxide-simeth, benzonatate, bisacodyl, fentaNYL, menthol-cetylpyridinium **OR** [DISCONTINUED] phenol, norepinephrine, senna-docusate, sodium chloride flush, sodium chloride flush  Active Hospital Problem list   See systems below  Assessment & Plan:  #Acute Hypoxic Respiratory Failure #HCAP   Complete opacification of left lung   Ipsilateral deviation of trachea consistent with mucus plugging  - plan for bronchoscopy to perform therapeutic aspiration of tracheobronchial tree and BAL  Encephalopathy   - will dc cefepime   - metabolic component -    - HAGMA   #  Cervical Myelopathy PMHx: Cervical stenosis, Cervical Spinal Cord Injury s/p fall s/p C2 dome laminectomy, C3-6 laminoplasty on 1/21 now complicated by Epidural Hematoma s/p surgical decompression 1/26 -Neurosurgery following, apprec ~  Follow recommendation as below: -Vasopressors as needed to maintain MAP goal >85 -Hold midodrine and Florinef while on pressors -Neuro checks q2h -PT/OT once stable -Foley catheter for urinary retention -Pain management (Fentanyl while intubated)   #HCAP Met SIRS criteria: tachycardia, sob, hypoxia CT chest shows Left lower lobe pneumonia -Monitor fever curve -Trend WBC's & Procalcitonin -Follow cultures as above -Continue empiric abx ppx pending cultures & sensitivities  #Acute Blood Loss Anemia Post Op #Hx of Blood Dyscrasia -Monitor for S/Sx of bleeding -Trend CBC (H&H q6h) -SCD's for VTE Prophylaxis (chemical ppx contraindicated) -Transfuse for Hgb <7  s/p blood transfusion  #AKI on CKD stage III -Monitor I&O's / urinary output -Follow BMP -Ensure adequate renal perfusion -Avoid nephrotoxic agents as able -Replace electrolytes as indicated  #T2DM -HgbA1c goal  -CBG's q4; Target range of 140 to 180 -SSI -Follow ICU Hypo/Hyperglycemia protocol -Hold home Glipizide     #Hypothyroidism -Check TSH, Free T4 -continue home synthroid  Best practice:  Diet:  NPO Pain/Anxiety/Delirium protocol (if indicated): Yes (RASS goal -1) VAP protocol (if indicated): Yes DVT prophylaxis: Contraindicated GI prophylaxis: H2B Glucose control:  SSI Yes Central venous access:  N/A Arterial line:  Yes, and it is still needed Foley:  Yes, and it is still needed Mobility:  bed rest  PT consulted: N/A Last date of multidisciplinary goals of care discussion []  Code Status:  full code Disposition: ICU   = Goals of Care = Code Status Order: FULL  Primary Emergency Contact: Muse,Timothy S, Home Phone: [phone] Wishes to pursue full aggressive treatment and intervention options, including CPR and intubation, but goals of care will be addressed on going with family if that should become necessary.  Critical care provider statement:   Total critical care time: 62 minutes    Performed by: Karna Christmas MD   Critical care time was exclusive of separately billable procedures and treating other patients.   Critical care was necessary to treat or prevent imminent or life-threatening deterioration.   Critical care was time spent personally by me on the following activities: development of treatment plan with patient and/or surrogate as well as nursing, discussions with consultants, evaluation of patient's response to treatment, examination of patient, obtaining history from patient or surrogate, ordering and performing treatments and interventions, ordering and review of laboratory studies, ordering and review of radiographic studies, pulse oximetry and re-evaluation of patient's condition.    Vida Rigger, M.D.  Pulmonary & Critical Care Medicine

## 2023-08-07 NOTE — Progress Notes (Signed)
On assessment, noted increased work of breathing. Maintaining oxygen saturation at 97 on 4L HFNC. Respiratory rate 28-32. Patient is alert, able to follow commands. Patient able to nod to questions appropriately, patient is not responding with voice. Of note CO2 is 16 this morning. This nurse notified attending MD and ICU MD. Awaiting response.

## 2023-08-07 NOTE — Progress Notes (Cosign Needed Addendum)
Neurosurgery Progress Note  History: [name] is here for C2 dome laminectomy, C3-6 laminoplasty and now s/p evacuation hematoma  POD 10/6: Pt experiencing increased work of breathing this morning and worsening findings on chest xray resulting in reintubation this morning. She is currently undergoing bronchoscopy by Dr. Karna Christmas  POD 9/5: Pt more alert this morning asking what day it is  POD 8/4: Very drowsy but arouses this morning to voice.  POD 7/3: Pt is very drowsy, but able to follow some commands  POD6/2: Pt is drowsy this morning asking "what do you want" when awaken  Interval POD5: [name] had a decline yesterday with her left upper extremity and then bilateral lower extremities.  CT scan was unremarkable MRI did show a large epidural hematoma.  She was taken emergently to the OR by Dr. Katrinka Blazing for decompression.  She has been monitored in the ICU overnight. She is still intubated and on sedation. She is on pressors to maintain [facility]   Hospital Course POD4: [name] is more awake today. She states she isnt having a lot of pain but she is thirsty.  POD3:Patient more alert this AM. However; hypoxia, tachycardia, SOB, low energy.  POD2: Very lethargic and difficult to arouse, states in quite a bit of pain. Medicine had been held due to sleepiness.    Physical Exam: Vitals:   08/07/23 0700 08/07/23 0800  BP: (!) 160/53 (!) 162/54  Pulse: 70 67  Resp: (!) 28 (!) 25  Temp: 99 F (37.2 C) 98.8 F (37.1 C)  SpO2: 97% 97%    Pt intubated and sedated undergoing bronchoscopy   Data:  Output by Drain (mL) 08/05/23 0701 - 08/05/23 1900 08/05/23 1901 - 08/06/23 0700 08/06/23 0701 - 08/06/23 1900 08/06/23 1901 - 08/07/23 0700 08/07/23 0701 - 08/07/23 1028  Requested LDAs do not have output data documented.     Assessment/Plan:  [name]  s/p C2 dome laminectomy, C3-6 laminoplasty  On 1/21 with return to OR on 1/25 for cervical/thoracic laminectomy and epidural  hematoma evacuation  -Continue to monitor in ICU, recommend continued maintenance of MAP pressures above 80 mmHg. Will extend MAP management for a full 5 days. Ok to relax MAP goals 2/1. - pain control continue with IV and oral medications - Continue to hold DVT prophylaxis. Will reach out to hem for assistance on timing for resuming this.  - PT/OT when able - HV removed on 1/29 - PICC placement and TPN started.  - pneumonia - diagnosed 1/24 via CT, started on broad spectrum antibiotics. 1/30 CXR showing worsening. Pt reintubated on 1/31 due to findings patients increased work of breathing.  - CC and IM assisting with non surgical management. We appreciate your assistance.    Manning Charity PA-C Department of Neurosurgery

## 2023-08-07 NOTE — Progress Notes (Signed)
PHARMACY - TOTAL PARENTERAL NUTRITION CONSULT NOTE   Indication: Unable to place NG tube for enteral nutrition   Patient Measurements: Height: 5\' 7"  (170.2 cm) Weight: 62.1 kg (136 lb 14.5 oz) IBW/kg (Calculated) : 61.6 TPN AdjBW (KG): 58.9 Body mass index is 21.44 kg/m.  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   Glucose / Insulin:  --BG 166 - 241 (SSI required 40u/24h) --on 5 units insulin glargine once daily Electrolytes: hypernatremia, hypermagnesemia, hypophosphatemia Renal: SCr 1.42 > 0.83 Hepatic: LFTs wnl, bilirubin trending up Intake / Output; net (+) 1.9 L MIVF: none GI Imaging: none recent GI Surgeries / Procedures: none recent  Central access: 08/04/23 TPN start date: 08/04/23   RD Assessment: Estimated Needs Total Energy Estimated Needs: 1800-2100kcal/day Total Protein Estimated Needs: 90-105g/day Total Fluid Estimated Needs: 1.8-2.1L/day  Current Nutrition:  none  Plan:  ---given persistently elevated sodium levels I will change to  8/10 TPN (without electrolytes) at 61mL/hr + SMOF lipids 250 mL over 12 hours starting at 1800 --will order a triglyceride level for tomorrow ---Add standard MVI, thiamine 100mg  daily x 5 days (day #4) and trace elements to TPN ---continue Sensitive q4h SSI and adjust as needed  + 5 units glargine SQ once daily + 3 units insulin aspart every 4 hours (per NP) ---Monitor TPN labs on Mon/Thurs, daily until stable --continue 5% dextrose at 50 mL/hr for hypernatremia  Lowella Bandy 08/07/2023,7:09 AM

## 2023-08-07 NOTE — Progress Notes (Signed)
Patient with acute respiratory failure s/p intubation.    No episode of any bleeding noted.  Given the risk of DVT/PE recommend starting Lovenox 40 mg a day.  Discussed with ICU team/neurosurgery.  Monitor closely for any bleeding.  Bleeding diathesis workup still in progress.  GB

## 2023-08-07 NOTE — Procedures (Signed)
Endotracheal Intubation: Patient required placement of an artificial airway secondary to Respiratory Failure  Consent: Emergent.   Hand washing performed prior to starting the procedure.   Medications administered for sedation prior to procedure:  Rocuronium 50 mg IV, Fentanyl 150 mcg IV.    A time out procedure was called and correct patient, name, & ID confirmed. Needed supplies and equipment were assembled and checked to include ETT, 10 ml syringe, Glidescope, Mac and Miller blades, suction, oxygen and bag mask valve, end tidal CO2 monitor.   Patient was positioned to align the mouth and pharynx to facilitate visualization of the glottis.   Heart rate, SpO2 and blood pressure was continuously monitored during the procedure. Pre-oxygenation was conducted prior to intubation and endotracheal tube was placed through the vocal cords into the trachea.     The artificial airway was placed under direct visualization via glidescope route using a 8.0 ETT on the first attempt.  ETT was secured at 23 cm mark.  Placement was confirmed by auscuitation of lungs with good breath sounds bilaterally and no stomach sounds.  Condensation was noted on endotracheal tube.   Pulse ox 98%.  CO2 detector in place with appropriate color change.   Complications: None .    Chest radiograph ordered and pending.   Comments: OGT placed via glidescope.  Vida Rigger, M.D.  Pulmonary & Critical Care Medicine  [facility]

## 2023-08-07 NOTE — Plan of Care (Signed)
   Problem: Fluid Volume: Goal: Ability to maintain a balanced intake and output will improve Outcome: Progressing   Problem: Metabolic: Goal: Ability to maintain appropriate glucose levels will improve Outcome: Progressing   Problem: Nutritional: Goal: Maintenance of adequate nutrition will improve Outcome: Progressing   Problem: Skin Integrity: Goal: Risk for impaired skin integrity will decrease Outcome: Progressing

## 2023-08-07 NOTE — Procedures (Signed)
PROCEDURE: BRONCHOSCOPY Therapeutic Aspiration of Tracheobronchial Tree Fiberoptic bronchoscopy with bronchoalveolar lavage  PROCEDURE DATE: 08/07/2023  TIME:  NAME:  [name]  DOB:[birth year]  MRN: [id number] LOC:  IC11A/IC11A-AA    HOSP DAY: @LENGTHOFSTAYDAYS @ CODE STATUS:      Code Status Orders  (From admission, onward)           Start     Ordered   07/28/23 1459  Full code  Continuous       Question:  By:  Answer:  Procedural case: previous code status reviewed   07/28/23 1458           Code Status History     Date Active Date Inactive Code Status Order ID Comments User Context   04/22/2023 1239 05/12/2023 1531 Full Code [id number]  Milinda Antis, PA-C Inpatient   04/22/2023 1239 04/22/2023 1239 Full Code [id number]  Milinda Antis, PA-C Inpatient   04/13/2023 1640 04/22/2023 1237 Full Code [id number]  Lilia Pro, MD ED   04/13/2023 1629 04/13/2023 1640 Full Code [id number]  Erin Fulling, MD ED   08/11/2021 2222 08/16/2021 1950 Full Code [id number]  Andris Baumann, MD ED   03/16/2017 1059 03/17/2017 1935 Full Code [id number]  Schermerhorn, Ihor Austin, MD Inpatient   01/18/2017 1745 01/19/2017 2033 Full Code [id number]  Enedina Finner, MD ED   11/04/2015 1219 11/06/2015 2047 Full Code [id number]  Katha Hamming, MD ED           Indications/Preliminary Diagnosis:   Consent: (Place X beside choice/s below)  The benefits, risks and possible complications of the procedure were        explained to:  ___ patient  _x__ patient's family  ___ other:___________  who verbalized understanding and gave:  ___ verbal  __x_ written  ___ verbal and written  ___ telephone  ___ other:________ consent.      Unable to obtain consent; procedure performed on emergent basis.     Other:       PRESEDATION ASSESSMENT: History and Physical has been performed. Patient meds and allergies have been reviewed. Presedation airway examination has been performed and documented. Baseline  vital signs, sedation score, oxygenation status, and cardiac rhythm were reviewed. Patient was deemed to be in satisfactory condition to undergo the procedure.    x   PROCEDURE DETAILS: Timeout performed and correct patient, name, & ID confirmed. Following prep per Pulmonary policy, appropriate sedation was administered. The Bronchoscope was inserted in to oral cavity with bite block in place. Therapeutic aspiration of Tracheobronchial tree was performed.  Airway exam proceeded with findings, technical procedures, and specimen collection as noted below. At the end of exam the scope was withdrawn without incident. Impression and Plan as noted below.           Airway Prep (Place X beside choice below)   1% Transtracheal Lidocaine Anesthetization 7 cc   Patient prepped per Bronchoscopy Lab Policy       Insertion Route (Place X beside choice below)   Nasal   Oral  x Endotracheal Tube   Tracheostomy   INTRAPROCEDURE MEDICATIONS:  Sedative/Narcotic Amt Dose   Versed  mg   Fentanyl gtt mcg  Diprivan gtt mg       Medication Amt Dose  Medication Amt Dose  Lidocaine 1%  cc  Epinephrine 1:10,000 sol  cc  Xylocaine 4%  cc  Cocaine  cc   TECHNICAL PROCEDURES: (Place X beside choice below)   Procedures  Description    None  Electrocautery     Cryotherapy     Balloon Dilatation     Bronchography     Stent Placement   x  Therapeutic Aspiration LLL, LUL, lungula    Laser/Argon Plasma    Brachytherapy Catheter Placement    Foreign Body Removal         SPECIMENS (Sites): (Place X beside choice below)  Specimens Description   No Specimens Obtained     Washings   x Lavage Left lower lobe   Biopsies    Fine Needle Aspirates    Brushings    Sputum    FINDINGS:  ESTIMATED BLOOD LOSS: none COMPLICATIONS/RESOLUTION: none      IMPRESSION:POST-PROCEDURE DX:   BAL sent for microbiology   RECOMMENDATION/PLAN:      Vida Rigger, M.D.  Pulmonary & Critical Care  Medicine  [facility]

## 2023-08-07 NOTE — Progress Notes (Signed)
Pt had bedside bronch on vent. MD suctioned copious amounts of thick sputum. Pt was ventilated with AMBU bag during procedure. Placed back on vent post procedure. Tolerating well at this time, sats 100%, will continue to wean FiO2 as tolerated.

## 2023-08-07 NOTE — Progress Notes (Signed)
Reassessment completed by this nurse. Noted a change in pupil size. Left 4cm Right 3cm. Notified Dr Karna Christmas. Dr Karna Christmas at beside for evaluation. Dr Karna Christmas noted cataracts in left eye. No new orders.

## 2023-08-07 NOTE — Progress Notes (Signed)
SLP Cancellation Note  Patient Details Name: [name] MRN: [id number] DOB: April 22, 1956   Cancelled treatment:       Reason Eval/Treat Not Completed: Medical issues which prohibited therapy;Patient not medically ready (chart reviewed)  Pt had a decline in pulmonary/medical status today: "Pt with worsening hypoxemia requiring intubation and bronchoscopy today. TPN initiated.".  During this admit, pt was unable to have NGT placed secondary to KNOWN Baseline Esophageal phase stenosis. Pt will likely need PEG placement if full care is continued as oral intake will not likely pass through the Esophagus appropriately as well.  Pt is NOT appropriate for ST services in setting of her Baseline comorbidities. ST services will sign off.    Jerilynn Som, MS, CCC-SLP Speech Language Pathologist Rehab Services; [facility] [phone] (ascom) Tianni Escamilla 08/07/2023, 5:12 PM

## 2023-08-08 ENCOUNTER — Inpatient Hospital Stay: Payer: Medicare Other

## 2023-08-08 LAB — RENAL FUNCTION PANEL
Albumin: 3.4 g/dL — ABNORMAL LOW (ref 3.5–5.0)
Anion gap: 7 (ref 5–15)
BUN: 70 mg/dL — ABNORMAL HIGH (ref 8–23)
CO2: 14 mmol/L — ABNORMAL LOW (ref 22–32)
Calcium: 8.7 mg/dL — ABNORMAL LOW (ref 8.9–10.3)
Chloride: 125 mmol/L — ABNORMAL HIGH (ref 98–111)
Creatinine, Ser: 0.91 mg/dL (ref 0.44–1.00)
GFR, Estimated: >60 mL/min (ref >60)
Glucose, Bld: 232 mg/dL — ABNORMAL HIGH (ref 70–99)
Phosphorus: 2.6 mg/dL (ref 2.5–4.6)
Potassium: 4.3 mmol/L (ref 3.5–5.1)
Sodium: 146 mmol/L — ABNORMAL HIGH (ref 135–145)

## 2023-08-08 LAB — CBC
HCT: 26.4 % — ABNORMAL LOW (ref 36.0–46.0)
Hemoglobin: 9 g/dL — ABNORMAL LOW (ref 12.0–15.0)
MCH: 32.3 pg (ref 26.0–34.0)
MCHC: 34.1 g/dL (ref 30.0–36.0)
MCV: 94.6 fL (ref 80.0–100.0)
Platelets: 201 10*3/uL (ref 150–400)
RBC: 2.79 MIL/uL — ABNORMAL LOW (ref 3.87–5.11)
RDW: 15 % (ref 11.5–15.5)
WBC: 13 10*3/uL — ABNORMAL HIGH (ref 4.0–10.5)
nRBC: 0.8 % — ABNORMAL HIGH (ref 0.0–0.2)

## 2023-08-08 LAB — GLUCOSE, CAPILLARY
Glucose-Capillary: 152 mg/dL — ABNORMAL HIGH (ref 70–99)
Glucose-Capillary: 159 mg/dL — ABNORMAL HIGH (ref 70–99)
Glucose-Capillary: 171 mg/dL — ABNORMAL HIGH (ref 70–99)
Glucose-Capillary: 189 mg/dL — ABNORMAL HIGH (ref 70–99)
Glucose-Capillary: 202 mg/dL — ABNORMAL HIGH (ref 70–99)
Glucose-Capillary: 206 mg/dL — ABNORMAL HIGH (ref 70–99)

## 2023-08-08 LAB — FACTOR 13 ACTIVITY: Factor XIII, Qualitative: NORMAL

## 2023-08-08 LAB — MAGNESIUM: Magnesium: 1.9 mg/dL (ref 1.7–2.4)

## 2023-08-08 LAB — TRIGLYCERIDES: Triglycerides: 697 mg/dL — ABNORMAL HIGH (ref <150)

## 2023-08-08 MED ORDER — SODIUM CHLORIDE 3 % IN NEBU
4.0000 mL | INHALATION_SOLUTION | Freq: Three times a day (TID) | RESPIRATORY_TRACT | Status: AC
  Administered 2023-08-08 – 2023-08-11 (×9): 4 mL via RESPIRATORY_TRACT
  Filled 2023-08-08 (×9): qty 4

## 2023-08-08 MED ORDER — ALBUTEROL SULFATE (2.5 MG/3ML) 0.083% IN NEBU
2.5000 mg | INHALATION_SOLUTION | Freq: Three times a day (TID) | RESPIRATORY_TRACT | Status: AC
  Administered 2023-08-08 – 2023-08-11 (×9): 2.5 mg via RESPIRATORY_TRACT
  Filled 2023-08-08 (×9): qty 3

## 2023-08-08 MED ORDER — TRACE MINERALS CU-MN-SE-ZN 300-55-60-3000 MCG/ML IV SOLN
INTRAVENOUS | Status: AC
  Filled 2023-08-08: qty 1000

## 2023-08-08 NOTE — Progress Notes (Signed)
NAME:  [name], MRN:  [id number], DOB:  February 27, 1956, LOS: 11 ADMISSION DATE:  07/28/2023, CONSULTATION DATE:  08/02/23 REFERRING MD:  Ernestine Mcmurray CHIEF COMPLAINT:  cervical myelopathy  HPI  [name] is a 68 year old old female with PMHx of hypothyroidism, insulin-dependent DM, CKD stage III, dysphagia with esophageal strictures, facial cellulitis, GERD, neuropathy, arthritis, blood dyscrasia, and recent traumatic cervical central cord injury who presented to the hospital for elective cervical spine surgery.  Per patient's chart, patient was seen by neurosurgery on 07/28/2023 for follow-up after suffering a fall 3 months ago in which she sustained a cervical spinal cord injury.  Per patient, they deferred emergent surgical depression and wanted to let her have time to recover and work with physical therapy.  She continued to have hand weakness as well as lower extremity weakness and ambulatory issues.  Given the ongoing compression of C3-C6, laminoplasty and C2 dome laminectomy was indicated.    Hospital course: Patient underwent C3-6 laminoplasty and C2 dome and laminectomy surgery on 07/29/2023 with drain placement.   Pertinent Labs/Diagnostics Findings: Na+/ K+: 141/4.4 Glucose: 136 BUN/Cr.47/1.42 WBC: 5.8 K/L  Hgb/Hct: 7.4/22.4  Past Medical History  Hypothyroidism, insulin-dependent DM, CKD stage III, dysphagia with esophageal strictures, facial cellulitis, GERD, neuropathy, arthritis, blood dyscrasia, and recent traumatic cervical central cord injury  Significant Hospital Events   01/21: Patient underwent posterior C3-6 Cervical Laminoplasty with C2 inferior dome Laminectomy 01/22: POD #1. Pt. stable with no acute changes.  Working with PT and OT 01/23: POD #2.  Pt. noted to be very lethargic and difficult to arouse 01/24: POD #3. Pt. developed hypoxia tachycardia and shortness of breath.  Hospitalist consulted CT chest obtained and showed possible pneumonia.  Started on IV  antibiotics and DuoNebs. 01/25: POD #4. Pt. with new concerns of not moving the left arm.  CT head and cervical spine obtained with no new concerns.  Follow-up MRI however demonstrated epidural hematoma extending from the cervical spine to the lower thoracic spine with acute compression and T2 signal.  Taken emergently to the OR for surgical decompression 01/26: Pt. transferred to the ICU s/p emergent cervical decompression and evacuation of epidural hematoma.  Remained intubated. PCCM consulted 08/03/23- s/p surgery overnight, alert to self but drowsy with mild AKI and metabolic acidosis.  Lethargy noted have dcd cefepime and started doxycycline instead.  08/04/23- patient more alert able to wiggle toes to verbal.  Remains critically ill.  08/05/23- patient appears to have hypoactive delerium with waxing and waning sensorium.  At times she asks for things other times she's poorly responsive.  Her oxygenation has improved with nasal canula 5L/min.  S/p heme/onc evaluation today 08/06/23- patient seen at bedside, she was able to whisper few words tome.  I discussed PO intake with speech pathologist and SLP with barium was ordered however patient unable to complete this study. She is still very weak physically barely able to wiggle toes.  H/h <7 s/p prbc.  Renal function is improved.  08/07/23- patient had worsening hypoxemia with CXR showing worsening complete left opacification. I discussed this with neurosurgery and they stated her cervical spine did not have any new hardware so intubation should not be complicated. I met with PA Manning Charity at bedside  to review care plan. We discussed these findings with husband and proceeded with intubation and bronchoscopy.  The entire left lung appeared completely full of mucus impaction in both upper and lower lobes during bronchoscopy.  CXR post bronchoscopy is in process.  08/08/23- patient is extubated and is able to speak few words with family.  She moved her feet and  right hand today.  I met with husband and son at bedside reviewed imaging and bloodwork.   Consults:  PCCM Neurosurgery Hospitalist  Procedures:  1/21:posterior C3-6 Cervical Laminoplasty with C2 inferior dome Laminectomy 1/25: s/p Epidural Hematoma Decompression 1/25: Intubation 1/25: Arterial Line  Significant Diagnostic Tests:  1/25:MRI  Thoracic and Cervical Spine> IMPRESSION: 1. Postoperative changes from recent C3 through C6 laminectomy with laminoplasty. Associated fractures better appreciated on prior CT. 2. Dorsal epidural collection extending from C2-3 through T9, likely epidural hematoma. Resultant severe diffuse spinal stenoses stenosis with cord compression at these levels. 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 Emergent neuro surgical 3. Underlying multilevel cervical spondylosis with resultant multilevel foraminal narrowing as above. Notable findings include severe left C4 foraminal stenosis, moderate bilateral C5 foraminal narrowing, with mild bilateral C6 and right C7 foraminal stenosis.  1/25: Noncontrast CT Head &Cervical Spine> IMPRESSION: 1. No acute intracranial abnormality. 2. Mild cortical atrophy, within normal limits for patient age. 3. Improved paranasal sinus mucosal opacification as described above.  1/24: CTA Chest> IMPRESSION: Left lower lobe pneumonia without sizable effusion.   Micro Data:  1/24: Blood culture x2> 1/24: MRSA PCR>> positive 1/24: Strep pneumo urinary antigen>  Antimicrobials:  1/24 Vancomycin >>  1/24 Cefepime >>  OBJECTIVE  Blood pressure (!) 169/68, pulse 76, temperature 99.3 F (37.4 C), temperature source Rectal, resp. rate 19, height 5\' 7"  (1.702 m), weight 57.8 kg, SpO2 100%.  Vent Mode: PSV;CPAP FiO2 (%):  [28 %-100 %] 28 % Set Rate:  [16 bmp] 16 bmp Vt Set:  [500 mL] 500 mL PEEP:  [5 cmH20-8 cmH20] 5 cmH20 Pressure  Support:  [8 cmH20] 8 cmH20 Plateau Pressure:  [20 cmH20] 20 cmH20   Intake/Output Summary (Last 24 hours) at 08/08/2023 0841 Last data filed at 08/08/2023 0746 Gross per 24 hour  Intake 3949.17 ml  Output 4000 ml  Net -50.83 ml   Filed Weights   08/06/23 0500 08/07/23 0351 08/08/23 0320  Weight: 61.9 kg 62.1 kg 57.8 kg   Physical Examination  GENERAL: lying down in bed, no apparent distress -encephalopathy noted CVS: RRR, no carotid bruit. LUNGS: decreased air entry on left with rhonchi ABDOMEN: Soft, NTTP EXTREMITIES: no edema or cyanosis NEURO: MENTAL STATUS: sedated on propofol LANG/SPEECH: non-verbal (sedated) CRANIAL NERVES: Pupils are equal and reactive, face symmetric - poor cough and gag to suctioning, rest of cranial nerves were deferred due to sedation. MOTOR: no spontaneous movements - no withdrawal to pain on either side (sedated) REFLEXES: hyporeflexic bilaterally SENSORY: no reaction to pain in both sides GAIT: deferred -  SKIN: intact except as below:      Labs/imaging that I havepersonally reviewed  (right click and "Reselect all SmartList Selections" daily)     Labs   CBC: Recent Labs  Lab 08/04/23 0401 08/05/23 0403 08/06/23 0348 08/06/23 1343 08/06/23 2202 08/07/23 0346 08/07/23 1756 08/08/23 0405  WBC 10.1 9.9 10.9*  --   --  12.4*  --  13.0*  HGB 7.6* 7.2* 6.8* 6.6* 8.5* 8.3* 8.3* 9.0*  HCT 23.4* 20.9* 20.1* 20.5* 26.3* 25.3* 27.1* 26.4*  MCV 89.0 89.3 90.1  --   --  89.7  --  94.6  PLT 238 235 224  --   --  194  --  201    Basic Metabolic Panel: Recent  Labs  Lab 08/04/23 0401 08/05/23 0403 08/06/23 0348 08/07/23 0346 08/07/23 1150 08/08/23 0405 08/08/23 0641  NA 148* 153* 154* 153* 149* 146*  --   K 4.2 4.1 3.8 4.5 5.2* 4.3  --   CL 119* 125* 128* 127* 126* 125*  --   CO2 15* 18* 19* 16* 14* 14*  --   GLUCOSE 144* 181* 241* 214* 281* 232*  --   BUN 50* 57* 62* 71* 78* 70*  --   CREATININE 1.06* 1.02* 0.83 0.88 1.08* 0.91  --    CALCIUM 8.3* 8.6* 9.1 8.9 9.1 8.7*  --   MG 2.3 2.8* 2.4 2.1  --   --  1.9  PHOS 2.6 2.2* 2.2* 3.3  --  2.6  --    GFR: Estimated Creatinine Clearance: 56.2 mL/min (by C-G formula based on SCr of 0.91 mg/dL). Recent Labs  Lab 08/02/23 0315 08/03/23 0450 08/03/23 1307 08/04/23 0401 08/05/23 0403 08/06/23 0348 08/07/23 0346 08/08/23 0405  WBC 5.2   < >  --    < > 9.9 10.9* 12.4* 13.0*  LATICACIDVEN 1.8  --  0.9  --   --   --   --   --    < > = values in this interval not displayed.    Liver Function Tests: Recent Labs  Lab 08/02/23 0315 08/03/23 0450 08/04/23 0401 08/05/23 0403 08/06/23 0348 08/07/23 0346 08/08/23 0405  AST 26 22 21 19 20   --   --   ALT 13 16 15 15 16   --   --   ALKPHOS 48 51 51 46 49  --   --   BILITOT 1.0 0.9 1.5* 1.0 0.7  --   --   PROT 5.8* 5.8* 6.0* 5.7* 6.1*  --   --   ALBUMIN 2.7* 2.6* 2.8* 2.9* 3.2* 3.0* 3.4*   No results for input(s): "LIPASE", "AMYLASE" in the last 168 hours. No results for input(s): "AMMONIA" in the last 168 hours.  ABG    Component Value Date/Time   PHART 7.26 (L) 08/07/2023 1756   PCO2ART 33 08/07/2023 1756   PO2ART 120 (H) 08/07/2023 1756   HCO3 14.8 (L) 08/07/2023 1756   ACIDBASEDEF 11.2 (H) 08/07/2023 1756   O2SAT 98.9 08/07/2023 1756     Coagulation Profile: Recent Labs  Lab 08/01/23 2005 08/05/23 1811  INR 1.3* 1.2    Cardiac Enzymes: No results for input(s): "CKTOTAL", "CKMB", "CKMBINDEX", "TROPONINI" in the last 168 hours.  HbA1C: Hemoglobin A1C  Date/Time Value Ref Range Status  02/20/2013 04:02 AM 10.3 (H) 4.2 - 6.3 % Final    Comment:    The American Diabetes Association recommends that a primary goal of therapy should be <7% and that physicians should reevaluate the treatment regimen in patients with HbA1c values consistently >8%.    Hgb A1c MFr Bld  Date/Time Value Ref Range Status  04/13/2023 11:34 AM 5.9 (H) 4.8 - 5.6 % Final    Comment:    (NOTE) Pre diabetes:           5.7%-6.4%  Diabetes:              >6.4%  Glycemic control for   <7.0% adults with diabetes   08/11/2021 08:39 PM 7.2 (H) 4.8 - 5.6 % Final    Comment:    (NOTE) Pre diabetes:          5.7%-6.4%  Diabetes:              >  6.4%  Glycemic control for   <7.0% adults with diabetes    CBG: Recent Labs  Lab 08/07/23 1512 08/07/23 2000 08/07/23 2320 08/08/23 0326 08/08/23 0752  GLUCAP 227* 156* 186* 189* 206*    Review of Systems:   Unable to be obtained secondary to the patient's encephalopathy  Past Medical History  She,  has a past medical history of Acute incomplete quadriplegia (HCC), Acute respiratory failure with hypoxia (HCC), AKI (acute kidney injury) (HCC), Arthritis, Blood dyscrasia, Central cord syndrome (HCC), Dental caries, Diabetes mellitus without complication (HCC), Diverticulosis, Dysphagia, Esophageal stricture, Facial cellulitis, GERD (gastroesophageal reflux disease), Hypothyroidism, Neuropathy, Odynophagia, Pelvic fracture (HCC), Renal insufficiency, Spinal cord compression, post-traumatic (HCC), Spinal cord injury, cervical region (HCC), and Stage 3a chronic kidney disease (CKD) (HCC).   Surgical History    Past Surgical History:  Procedure Laterality Date   ABDOMINAL HYSTERECTOMY     BILATERAL SALPINGECTOMY Bilateral 03/16/2017   Procedure: BILATERAL SALPINGECTOMY;  Surgeon: Schermerhorn, Ihor Austin, MD;  Location: [facility] ORS;  Service: Gynecology;  Laterality: Bilateral;   BIOPSY  05/03/2023   Procedure: BIOPSY;  Surgeon: Benancio Deeds, MD;  Location: [facility] ENDOSCOPY;  Service: Gastroenterology;;   COLONOSCOPY WITH PROPOFOL N/A 08/18/2018   Procedure: COLONOSCOPY WITH PROPOFOL;  Surgeon: Scot Jun, MD;  Location: [facility] ENDOSCOPY;  Service: Endoscopy;  Laterality: N/A;   CYSTOCELE REPAIR N/A 03/16/2017   Procedure: ANTERIOR REPAIR (CYSTOCELE);  Surgeon: Schermerhorn, Ihor Austin, MD;  Location: [facility] ORS;  Service: Gynecology;  Laterality: N/A;    ESOPHAGOGASTRODUODENOSCOPY (EGD) WITH PROPOFOL N/A 08/18/2018   Procedure: ESOPHAGOGASTRODUODENOSCOPY (EGD) WITH PROPOFOL;  Surgeon: Scot Jun, MD;  Location: [facility] ENDOSCOPY;  Service: Endoscopy;  Laterality: N/A;   ESOPHAGOGASTRODUODENOSCOPY (EGD) WITH PROPOFOL Left 05/01/2023   Procedure: ESOPHAGOGASTRODUODENOSCOPY (EGD) WITH PROPOFOL;  Surgeon: Benancio Deeds, MD;  Location: [facility] ENDOSCOPY;  Service: Gastroenterology;  Laterality: Left;   ESOPHAGOGASTRODUODENOSCOPY (EGD) WITH PROPOFOL N/A 05/03/2023   Procedure: ESOPHAGOGASTRODUODENOSCOPY (EGD) WITH PROPOFOL;  Surgeon: Benancio Deeds, MD;  Location: [facility] ENDOSCOPY;  Service: Gastroenterology;  Laterality: N/A;   POSTERIOR CERVICAL LAMINECTOMY N/A 07/28/2023   Procedure: C2 DOME LAMINECTOMY;  Surgeon: [name], MD;  Location: [facility] ORS;  Service: Neurosurgery;  Laterality: N/A;   POSTERIOR CERVICAL LAMINECTOMY N/A 08/01/2023   Procedure: POSTERIOR CERVICAL LAMINECTOMY;  Surgeon: [name], MD;  Location: [facility] ORS;  Service: Neurosurgery;  Laterality: N/A;   TEAR DUCT PROBING Right    TUBAL LIGATION     VAGINAL HYSTERECTOMY N/A 03/16/2017   Procedure: HYSTERECTOMY VAGINAL;  Surgeon: Schermerhorn, Ihor Austin, MD;  Location: [facility] ORS;  Service: Gynecology;  Laterality: N/A;   WISDOM TOOTH EXTRACTION       Social History   reports that she has never smoked. She has never used smokeless tobacco. She reports that she does not drink alcohol and does not use drugs.   Family History   Her family history includes Breast cancer in her maternal aunt; Diabetes in her father; Hypertension in her father and mother.   Allergies Allergies  Allergen Reactions   Amoxicillin-Pot Clavulanate Itching and Anaphylaxis   Ampicillin Rash     Home Medications  Prior to Admission medications   Medication Sig Start Date End Date Taking? Authorizing Provider  acetaminophen (TYLENOL) 650 MG CR tablet Take 650 mg by mouth every 8 (eight)  hours as needed for pain.   Yes [provider]  chlorhexidine (HIBICLENS) 4 % external liquid Apply 15 mLs (1 Application total) topically as directed for  30 doses. Use as directed daily for 5 days every other week for 6 weeks. 07/28/23  Yes Joan Flores, PA-C  cyanocobalamin (VITAMIN B12) 1000 MCG tablet Take 1,000 mcg by mouth daily.   Yes [provider]  cyclobenzaprine (FLEXERIL) 10 MG tablet Take 1 tablet (10 mg total) by mouth at bedtime. 06/15/23  Yes Lovorn, Aundra Millet, MD  diclofenac Sodium (VOLTAREN) 1 % GEL Apply 2 g topically 4 (four) times daily. Patient taking differently: Apply 2 g topically 4 (four) times daily as needed (pain). 05/11/23  Yes Setzer, Lynnell Jude, PA-C  fludrocortisone (FLORINEF) 0.1 MG tablet Take 2 tablets (0.2 mg total) by mouth daily. For orthostatic hypotension 06/15/23  Yes Lovorn, Aundra Millet, MD  gabapentin (NEURONTIN) 100 MG capsule Take 200 mg by mouth 2 (two) times daily.  08/16/15  Yes [provider]  glipiZIDE (GLUCOTROL) 5 MG tablet Take 10 mg by mouth daily before breakfast.   Yes [provider]  JARDIANCE 25 MG TABS tablet Take 25 mg by mouth daily.   Yes [provider]  lactase (LACTAID) 3000 units tablet Take 3,000 Units by mouth daily before breakfast.   Yes [provider]  levothyroxine (SYNTHROID) 88 MCG tablet Take 88 mcg by mouth daily before breakfast.   Yes [provider]  midodrine (PROAMATINE) 10 MG tablet Take 1.5 tablets (15 mg total) by mouth 3 (three) times daily with meals. For orthostatic hypotension 06/15/23  Yes Lovorn, Aundra Millet, MD  Multiple Vitamin (MULTIVITAMIN WITH MINERALS) TABS tablet Take 1 tablet by mouth daily. 04/22/23  Yes Sunnie Nielsen, DO  mupirocin ointment (BACTROBAN) 2 % Place 1 Application into the nose 2 (two) times daily for 60 doses. Use as directed 2 times daily for 5 days every other week for 6 weeks. 07/28/23 08/27/23 Yes Joan Flores, PA-C  pioglitazone  (ACTOS) 30 MG tablet Take 30 mg by mouth daily.   Yes [provider]  Psyllium (METAMUCIL 3 IN 1 DAILY FIBER PO) Take 2 capsules by mouth daily.   Yes [provider]  rosuvastatin (CRESTOR) 5 MG tablet Take 5 mg by mouth at bedtime.   Yes [provider]  senna (SENOKOT) 8.6 MG TABS tablet Take 1 tablet (8.6 mg total) by mouth at bedtime. 05/11/23  Yes Setzer, Lynnell Jude, PA-C  Scheduled Meds:  sodium chloride   Intravenous Once   Chlorhexidine Gluconate Cloth  6 each Topical Daily   docusate  100 mg Per Tube BID   enoxaparin (LOVENOX) injection  40 mg Subcutaneous Q24H   insulin aspart  0-20 Units Subcutaneous Q4H   insulin aspart  3 Units Subcutaneous Q4H   insulin glargine-yfgn  5 Units Subcutaneous Daily   levothyroxine  88 mcg Per Tube QAC breakfast   mupirocin ointment  1 Application Nasal BID   mouth rinse  15 mL Mouth Rinse Q2H   polyethylene glycol  17 g Per Tube Daily   sodium chloride flush  10-40 mL Intracatheter Q12H   sodium chloride flush  3-10 mL Intravenous Q12H   Continuous Infusions:  albumin human Stopped (08/07/23 1342)   cefTRIAXone (ROCEPHIN)  IV Stopped (08/07/23 1526)   dextrose 30 mL/hr at 08/08/23 0746   doxycycline (VIBRAMYCIN) IV 125 mL/hr at 08/08/23 0746   fentaNYL infusion INTRAVENOUS 25 mcg/hr (08/08/23 0746)   metronidazole Stopped (08/08/23 0606)   phenylephrine (NEO-SYNEPHRINE) Adult infusion 50 mcg/min (08/08/23 0746)   propofol (DIPRIVAN) infusion 20 mcg/kg/min (08/08/23 0746)   TPN (CLINIMIX) Adult without lytes 84 mL/hr at  08/08/23 0746   PRN Meds:.alum & mag hydroxide-simeth, benzonatate, bisacodyl, menthol-cetylpyridinium **OR** [DISCONTINUED] phenol, mouth rinse, senna-docusate, sodium chloride flush, sodium chloride flush  CXR before and after bronchoscopy     Assessment & Plan:  #Acute Hypoxic Respiratory Failure #HCAP   Complete opacification of left lung   Ipsilateral deviation of trachea consistent with  mucus plugging  -s/p bronchoscopy with marked improvement.  -currently on HFNC - very weak cough reflex, high risk of aspiration  -chest physiotherapy with bed PT -Hypersal neb for cough triggering and airway clearance  Encephalopathy   - will dc cefepime   - metabolic component -    - HAGMA   #Cervical Myelopathy PMHx: Cervical stenosis, Cervical Spinal Cord Injury s/p fall s/p C2 dome laminectomy, C3-6 laminoplasty on 1/21 now complicated by Epidural Hematoma s/p surgical decompression 1/26 -Neurosurgery following, apprec ~ Follow recommendation as below: -Vasopressors as needed to maintain MAP goal >85 -Hold midodrine and Florinef while on pressors -Neuro checks q2h -PT/OT once stable -Foley catheter for urinary retention -Pain management (Fentanyl while intubated)   #HCAP Met SIRS criteria: tachycardia, sob, hypoxia CT chest shows Left lower lobe pneumonia -Monitor fever curve -Trend WBC's & Procalcitonin -Follow cultures as above -Continue empiric abx ppx pending cultures & sensitivities  #Acute Blood Loss Anemia Post Op #Hx of Blood Dyscrasia -Monitor for S/Sx of bleeding -Trend CBC (H&H q6h) -SCD's for VTE Prophylaxis (chemical ppx contraindicated) -Transfuse for Hgb <7  s/p blood transfusion  #AKI on CKD stage III -Monitor I&O's / urinary output -Follow BMP -Ensure adequate renal perfusion -Avoid nephrotoxic agents as able -Replace electrolytes as indicated  #T2DM -HgbA1c goal  -CBG's q4; Target range of 140 to 180 -SSI -Follow ICU Hypo/Hyperglycemia protocol -Hold home Glipizide     #Hypothyroidism -Check TSH, Free T4 -continue home synthroid  Best practice:  Diet:  NPO Pain/Anxiety/Delirium protocol (if indicated): Yes (RASS goal -1) VAP protocol (if indicated): Yes DVT prophylaxis: Contraindicated GI prophylaxis: H2B Glucose control:  SSI Yes Central venous access:  N/A Arterial line:  Yes, and it is still needed Foley:  Yes, and it is  still needed Mobility:  bed rest  PT consulted: N/A Last date of multidisciplinary goals of care discussion []  Code Status:  full code Disposition: ICU   = Goals of Care = Code Status Order: FULL  Primary Emergency Contact: Tull,Timothy S, Home Phone: [phone] Wishes to pursue full aggressive treatment and intervention options, including CPR and intubation, but goals of care will be addressed on going with family if that should become necessary.  Critical care provider statement:   Total critical care time: 31 minutes   Performed by: Karna Christmas MD   Critical care time was exclusive of separately billable procedures and treating other patients.   Critical care was necessary to treat or prevent imminent or life-threatening deterioration.   Critical care was time spent personally by me on the following activities: development of treatment plan with patient and/or surrogate as well as nursing, discussions with consultants, evaluation of patient's response to treatment, examination of patient, obtaining history from patient or surrogate, ordering and performing treatments and interventions, ordering and review of laboratory studies, ordering and review of radiographic studies, pulse oximetry and re-evaluation of patient's condition.    Vida Rigger, M.D.  Pulmonary & Critical Care Medicine

## 2023-08-08 NOTE — Progress Notes (Signed)
RN attempted to place NG tube one time in each nare. Coiled in mouth. Nares have small amount of bleeding and placement aborted. MD made aware.

## 2023-08-08 NOTE — Progress Notes (Signed)
PHARMACY - TOTAL PARENTERAL NUTRITION CONSULT NOTE   Indication: Unable to place NG tube for enteral nutrition   Patient Measurements: Height: 5\' 7"  (170.2 cm) Weight: 57.8 kg (127 lb 6.8 oz) IBW/kg (Calculated) : 61.6 TPN AdjBW (KG): 58.9 Body mass index is 19.96 kg/m.  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   Glucose / Insulin:  --BG 189-206 (SSI required 45u/24h) --on 5 units insulin glargine once daily Electrolytes: hypernatremia, hypermagnesemia, hypophosphatemia Renal: SCr 1.42 > 0.83 > 0.91 Hepatic: LFTs wnl, bilirubin trending up Intake / Output; net (+) 4.3 L MIVF: none GI Imaging: none recent GI Surgeries / Procedures: none recent  Central access: 08/04/23 TPN start date: 08/04/23   RD Assessment: Estimated Needs Total Energy Estimated Needs: 1800-2100kcal/day Total Protein Estimated Needs: 95-110g/day Total Fluid Estimated Needs: 1.8-2.1L/day  Current Nutrition:  none  Plan:  Will give Clinimix @42  ml/hr today. Pt received Clinimix @84  ml/hr x 3 days. Will plan to do 1 L bags x 5 days and 2 L bags x 2 days. This will give the pt an average of 1353 Kcal/day on days when she receives lipids and average of 103 g/day of protein. Sodium is elevated so ordered clinimix without electrolytes. Holding SMOF lipids as TG are elevated and pt is on propofol 20 mcg/kg/min.  ---Add standard MVI, thiamine 100mg  daily x 5 days (day #5) and trace elements to TPN ---continue Sensitive q4h SSI and adjust as needed  + 5 units glargine SQ once daily + 3 units insulin aspart every 4 hours (per  NP) ---Monitor TPN labs on Mon/Thurs, daily until stable --continue 5% dextrose at 30 mL/hr for hypernatremia (122 kcal x 24 hours)  Ronnald Ramp, PharmD, BCPS 08/08/2023,9:49 AM

## 2023-08-08 NOTE — Procedures (Signed)
Extubation Procedure Note  Patient Details:   Name: [name] DOB: 1956-06-21 MRN: [id number]   Airway Documentation:    Vent end date: 08/08/23 Vent end time: 1025   Evaluation  O2 sats: stable throughout Complications: No apparent complications Patient did tolerate procedure well. Bilateral Breath Sounds: Clear   Extubated to HHFNC.  Patient w/ increased WOB.  SpO2 stable.  Suctioned a large amount of thick/tenacious secretions from upper airway prior to extubation.  MD aware.  [name] 08/08/2023, 10:34 AM

## 2023-08-08 NOTE — Progress Notes (Signed)
Neurosurgery Progress Note  History: [name] is here for C2 dome laminectomy, C3-6 laminoplasty and now s/p evacuation hematoma  POD 11/7: Was extubated this morning prior to evaluation.  Was able to track with eyes but was quite somnolent still.  Was reacting to painful stimuli in the bilateral lower extremities.  Left upper extremity was reacting to painful stimuli as well. POD 10/6: [name] experiencing increased work of breathing this morning and worsening findings on chest xray resulting in reintubation this morning. She is currently undergoing bronchoscopy by Dr. Karna Christmas  POD 9/5: [name] more alert this morning asking what day it is  POD 8/4: Very drowsy but arouses this morning to voice.  POD 7/3: [name] is very drowsy, but able to follow some commands  POD6/2: [name] is drowsy this morning asking "what do you want" when awaken  Interval POD5: [name] had a decline yesterday with her left upper extremity and then bilateral lower extremities.  CT scan was unremarkable MRI did show a large epidural hematoma.  She was taken emergently to the OR by Dr. Katrinka Blazing for decompression.  She has been monitored in the ICU overnight. She is still intubated and on sedation. She is on pressors to maintain [facility]   Hospital Course POD4: [name] is more awake today. She states she isnt having a lot of pain but she is thirsty.  POD3:Patient more alert this AM. However; hypoxia, tachycardia, SOB, low energy.  POD2: Very lethargic and difficult to arouse, states in quite a bit of pain. Medicine had been held due to sleepiness.    Physical Exam: Vitals:   08/08/23 0800 08/08/23 0900  BP: (!) 162/63 (!) 169/64  Pulse: 78 81  Resp: (!) 21 20  Temp: 99.3 F (37.4 C) 99.3 F (37.4 C)  SpO2: 100% 100%    Just emerged from extubation.  Reacting to painful stimuli in all 4 extremities.  Data:  Output by Drain (mL) 08/06/23 0701 - 08/06/23 1900 08/06/23 1901 - 08/07/23 0700 08/07/23 0701 - 08/07/23 1900  08/07/23 1901 - 08/08/23 0700 08/08/23 0701 - 08/08/23 0951  Requested LDAs do not have output data documented.     Assessment/Plan:  [name]  s/p C2 dome laminectomy, C3-6 laminoplasty  On 1/21 with return to OR on 1/25 for cervical/thoracic laminectomy and epidural hematoma evacuation.  She has had a complicated postoperative course with a spontaneous cervical thoracic hemorrhage on 125 which was taken emergently for decompression.  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  -Continue to monitor in ICU, we can relax MAP goals today. . - pain control continue with IV and oral medications -Given her immobilized status and risk for DVT PE we are resuming anticoagulation knowing that she would be at risk for repeat hemorrhage.  - [name]/OT when able - HV removed on 1/29 - PICC placement and TPN started.  - pneumonia - diagnosed 1/24 via CT, started on broad spectrum antibiotics. 1/30 CXR showing worsening. [name] reintubated on 1/31 due to findings patients increased work of breathing.  - CC and IM assisting with non surgical management. We appreciate your assistance.    [name], MD Department of Neurosurgery

## 2023-08-09 ENCOUNTER — Inpatient Hospital Stay: Payer: Medicare Other

## 2023-08-09 DIAGNOSIS — Z515 Encounter for palliative care: Secondary | ICD-10-CM | POA: Diagnosis not present

## 2023-08-09 DIAGNOSIS — Z22322 Carrier or suspected carrier of Methicillin resistant Staphylococcus aureus: Secondary | ICD-10-CM | POA: Diagnosis not present

## 2023-08-09 DIAGNOSIS — G959 Disease of spinal cord, unspecified: Secondary | ICD-10-CM | POA: Diagnosis not present

## 2023-08-09 DIAGNOSIS — J181 Lobar pneumonia, unspecified organism: Secondary | ICD-10-CM | POA: Diagnosis not present

## 2023-08-09 LAB — CBC
HCT: 25.1 % — ABNORMAL LOW (ref 36.0–46.0)
Hemoglobin: 7.9 g/dL — ABNORMAL LOW (ref 12.0–15.0)
MCH: 29.7 pg (ref 26.0–34.0)
MCHC: 31.5 g/dL (ref 30.0–36.0)
MCV: 94.4 fL (ref 80.0–100.0)
Platelets: 173 10*3/uL (ref 150–400)
RBC: 2.66 MIL/uL — ABNORMAL LOW (ref 3.87–5.11)
RDW: 14.8 % (ref 11.5–15.5)
WBC: 10.6 10*3/uL — ABNORMAL HIGH (ref 4.0–10.5)
nRBC: 0.3 % — ABNORMAL HIGH (ref 0.0–0.2)

## 2023-08-09 LAB — RENAL FUNCTION PANEL
Albumin: 3.1 g/dL — ABNORMAL LOW (ref 3.5–5.0)
Anion gap: 9 (ref 5–15)
BUN: 68 mg/dL — ABNORMAL HIGH (ref 8–23)
CO2: 12 mmol/L — ABNORMAL LOW (ref 22–32)
Calcium: 8.7 mg/dL — ABNORMAL LOW (ref 8.9–10.3)
Chloride: 125 mmol/L — ABNORMAL HIGH (ref 98–111)
Creatinine, Ser: 0.75 mg/dL (ref 0.44–1.00)
GFR, Estimated: >60 mL/min (ref >60)
Glucose, Bld: 128 mg/dL — ABNORMAL HIGH (ref 70–99)
Phosphorus: 2.5 mg/dL (ref 2.5–4.6)
Potassium: 4 mmol/L (ref 3.5–5.1)
Sodium: 146 mmol/L — ABNORMAL HIGH (ref 135–145)

## 2023-08-09 LAB — TRIGLYCERIDES: Triglycerides: 139 mg/dL (ref <150)

## 2023-08-09 LAB — MAGNESIUM: Magnesium: 1.6 mg/dL — ABNORMAL LOW (ref 1.7–2.4)

## 2023-08-09 LAB — GLUCOSE, CAPILLARY
Glucose-Capillary: 122 mg/dL — ABNORMAL HIGH (ref 70–99)
Glucose-Capillary: 121 mg/dL — ABNORMAL HIGH (ref 70–99)
Glucose-Capillary: 225 mg/dL — ABNORMAL HIGH (ref 70–99)
Glucose-Capillary: 150 mg/dL — ABNORMAL HIGH (ref 70–99)
Glucose-Capillary: 151 mg/dL — ABNORMAL HIGH (ref 70–99)
Glucose-Capillary: 125 mg/dL — ABNORMAL HIGH (ref 70–99)

## 2023-08-09 LAB — TROPONIN I (HIGH SENSITIVITY)
Troponin I (High Sensitivity): 63 ng/L — ABNORMAL HIGH (ref <18)
Troponin I (High Sensitivity): 56 ng/L — ABNORMAL HIGH (ref <18)

## 2023-08-09 LAB — LACTIC ACID, PLASMA: Lactic Acid, Venous: 0.8 mmol/L (ref 0.5–1.9)

## 2023-08-09 MED ORDER — MAGNESIUM SULFATE 2 GM/50ML IV SOLN
2.0000 g | Freq: Once | INTRAVENOUS | Status: AC
  Administered 2023-08-09: 2 g via INTRAVENOUS
  Filled 2023-08-09: qty 50

## 2023-08-09 MED ORDER — ROCURONIUM BROMIDE 10 MG/ML (PF) SYRINGE: 50.0000 mg | PREFILLED_SYRINGE | Freq: Once | INTRAVENOUS | Status: AC

## 2023-08-09 MED ORDER — FENTANYL CITRATE (PF) 100 MCG/2ML IJ SOLN: 100.0000 ug | Freq: Once | INTRAMUSCULAR | Status: AC

## 2023-08-09 MED ORDER — ROCURONIUM BROMIDE 10 MG/ML (PF) SYRINGE
PREFILLED_SYRINGE | INTRAVENOUS | Status: AC
  Administered 2023-08-09: 50 mg via INTRAVENOUS
  Filled 2023-08-09: qty 10

## 2023-08-09 MED ORDER — FENTANYL BOLUS VIA INFUSION
25.0000 ug | Freq: Once | INTRAVENOUS | Status: AC
  Administered 2023-08-09: 25 ug via INTRAVENOUS
  Filled 2023-08-09: qty 25

## 2023-08-09 MED ORDER — NOREPINEPHRINE 4 MG/250ML-% IV SOLN
INTRAVENOUS | Status: AC
  Administered 2023-08-09: 4 mg
  Filled 2023-08-09: qty 250

## 2023-08-09 MED ORDER — SODIUM BICARBONATE 8.4 % IV SOLN
100.0000 meq | Freq: Once | INTRAVENOUS | Status: AC
  Administered 2023-08-09: 100 meq via INTRAVENOUS

## 2023-08-09 MED ORDER — FAT EMUL FISH OIL/PLANT BASED 20% (SMOFLIPID)IV EMUL
250.0000 mL | INTRAVENOUS | Status: AC
  Administered 2023-08-09: 250 mL via INTRAVENOUS
  Filled 2023-08-09: qty 250

## 2023-08-09 MED ORDER — ETOMIDATE 2 MG/ML IV SOLN: 20.0000 mg | Freq: Once | INTRAVENOUS | Status: AC

## 2023-08-09 MED ORDER — NOREPINEPHRINE 16 MG/250ML-% IV SOLN
0.0000 ug/min | INTRAVENOUS | Status: DC
  Administered 2023-08-09: 9 ug/min via INTRAVENOUS
  Administered 2023-08-10: 14 ug/min via INTRAVENOUS
  Filled 2023-08-09 (×3): qty 250

## 2023-08-09 MED ORDER — ETOMIDATE 2 MG/ML IV SOLN
INTRAVENOUS | Status: AC
  Administered 2023-08-09: 20 mg via INTRAVENOUS
  Filled 2023-08-09: qty 10

## 2023-08-09 MED ORDER — TRACE MINERALS CU-MN-SE-ZN 300-55-60-3000 MCG/ML IV SOLN
INTRAVENOUS | Status: AC
  Filled 2023-08-09: qty 1000

## 2023-08-09 MED ORDER — FENTANYL CITRATE (PF) 100 MCG/2ML IJ SOLN
INTRAMUSCULAR | Status: AC
  Administered 2023-08-09: 100 ug via INTRAVENOUS
  Filled 2023-08-09: qty 2

## 2023-08-09 NOTE — Progress Notes (Signed)
Notified by primary RN that patient desaturated in the low 60's requiring BVM. On arrival to the bedside, patient was obtunded, hypoxic, hypotensive and bradycardic on the monitor with very wide irregular QRS complexes. Patient given 2 amps of sodium bicarb and rapid sequence intubation performed. Currently HR and BP stable on minimal pressors. ABG and XRAY pending. Patient's husband notified of need for re-intubation.   Webb Silversmith, DNP, CCRN, FNP-C, AGACNP-BC Acute Care & Family Nurse Practitioner  [facility]  See Amion for personal pager PCCM on call pager [phone] until 7 am

## 2023-08-09 NOTE — Progress Notes (Signed)
Daily Progress Note   Patient Name: [name]       Date: 08/09/2023 DOB: 08-Mar-1956  Age: 68 y.o. MRN#: [id number] Attending Physician: [name], MD Primary Care Physician: Jerl Mina, MD Admit Date: 07/28/2023  Reason for Consultation/Follow-up: Establishing goals of care  HPI/Brief Hospital Review: 68 year old old female with PMHx of hypothyroidism, insulin-dependent DM, CKD stage III, dysphagia with esophageal strictures, facial cellulitis, GERD, neuropathy, arthritis, blood dyscrasia, and recent traumatic cervical central cord injury who presented to the hospital for elective cervical spine surgery. Patient underwent C3-6 laminoplasty and C2 dome and laminectomy surgery on 07/29/2023 with drain placement.   Course has been complicated by hematoma development requiring evacuation, failed extubations requiring bronchoscopy due to mucous plugging, bleeding/anemia Has required several reintubations this admission, also requiring vasopressor support  Requested to engaged 2/2 by CCM team as [name] required reintubation earlier this AM  Palliative Medicine consulted for assisting with goals of care conversations.  Subjective: Extensive chart review has been completed prior to meeting patient including labs, vital signs, imaging, progress notes, orders, and available advanced directive documents from current and previous encounters.    Visited with [name] at her bedside, she remains intubated and sedated. Husband, son and daughter at bedside during time of visit.  Dr. Katrinka Blazing (NSGY) joined bedside conversation. He provided updates from surgical standpoint. He reviews the multiple complications she has suffered over course of hospitalization. He speaks to long road of  recovery ahead. He briefly discusses possible need for trach/PEG in the event [name] cannot be liberated from mechanical ventilation.  I discussed code status and the difference between Full Code and Do Not Resuscitate. Encouraged family to consider DNR/DNI status understanding evidenced based poor outcomes in similar hospitalized patients, as the cause of the arrest is likely associated with chronic/terminal disease rather than a reversible acute cardio-pulmonary event.    At this time family wishes for [name] to remain full code and they remain hopeful for recovery.  Answered and addressed all questions and concerns. PMT to continue to follow for ongoing needs and support.   Care plan was discussed with CCM team and nursing staff.  Thank you for allowing the Palliative Medicine Team to assist in the care of this patient.  Total time:  35 minutes  Time spent includes: Detailed  review of medical records (labs, imaging, vital signs), medically appropriate exam (mental status, respiratory, cardiac, skin), discussed with treatment team, counseling and educating patient, family and staff, documenting clinical information, medication management and coordination of care.  Leeanne Deed, DNP, AGNP-C Palliative Medicine   Please contact Palliative Medicine Team phone at [phone] for questions and concerns.

## 2023-08-09 NOTE — Procedures (Signed)
INTUBATION PROCEDURE NOTE  Allyiah Gartner  [id number]  February 04, 1956  Date:08/09/23  Time:7:25 AM   Provider Performing:Torres Hardenbrook A Eesha Schmaltz   Procedure: Intubation (31500)  Indication(s) Respiratory Failure  Consent Unable to obtain consent due to emergent nature of procedure.  Anesthesia Etomidate, Fentanyl, and Rocuronium  Time Out Verified patient identification, verified procedure, site/side was marked, verified correct patient position, special equipment/implants available, medications/allergies/relevant history reviewed, required imaging and test results available.  Sterile Technique Usual hand hygeine, masks, and gloves were used  Procedure Description Patient positioned in bed supine.  Sedation given as noted above.  Patient was intubated with endotracheal tube using Glidescope.  View was Grade 1 full glottis .  Number of attempts was 1.  Colorimetric CO2 detector was consistent with tracheal placement.  Complications/Tolerance None; patient tolerated the procedure well. Chest X-ray is ordered to verify placement.  EBL Minimal  Specimen(s) None    Webb Silversmith, DNP, CCRN, FNP-C, AGACNP-BC Acute Care & Family Nurse Practitioner  [facility]  See Amion for personal pager PCCM on call pager [phone] until 7 am

## 2023-08-09 NOTE — Progress Notes (Signed)
This nurse noted that pt's BP noted to be low at approximately 0645. As nurse walked into pt's room, SpO2 began to drop rapidly. Pt stated, "please don't leave me". Pt started to become lethargic. Charge nurse, additional nurses, and NP quickly into room to assist. Pt was assisted with breathing via BVM w/o difficulties, until able to be safely intubated with RT at bedside. See EMAR for meds administered for intubation, to include sodium bicarb amp x2. EKG and CXR obtained. Notified pt's husband. Bedside report given to oncoming nurse.

## 2023-08-09 NOTE — Progress Notes (Signed)
PHARMACY - TOTAL PARENTERAL NUTRITION CONSULT NOTE   Indication: Unable to place NG tube for enteral nutrition   Patient Measurements: Height: 5\' 7"  (170.2 cm) Weight: 58.9 kg (129 lb 13.6 oz) IBW/kg (Calculated) : 61.6 TPN AdjBW (KG): 58.9 Body mass index is 20.34 kg/m.  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   Glucose / Insulin:  --BG 189-206 > 120s (SSI required 47u/24h) --on 5 units insulin glargine once daily Electrolytes: hypernatremia, hypermagnesemia, hypophosphatemia Renal: SCr 1.42 > 0.83 > 0.91 Hepatic: LFTs wnl, bilirubin trending up Intake / Output; net (+) 4.3 L MIVF: none GI Imaging: none recent GI Surgeries / Procedures: none recent  Central access: 08/04/23 TPN start date: 08/04/23   RD Assessment: Estimated Needs Total Energy Estimated Needs: 1800-2100kcal/day Total Protein Estimated Needs: 95-110g/day Total Fluid Estimated Needs: 1.8-2.1L/day  Current Nutrition:  none  Plan:  Will continue Clinimix @42  ml/hr + SMOF lipids 20% today. Pt received Clinimix @84  ml/hr x 3 days (1/29-1/31). Will plan to do 1 L bags x 5 days and 2 L bags x 2 days. This will give the pt an average of 1353 Kcal/day on days when she receives lipids and average of 103 g/day of protein. Sodium is elevated so ordered clinimix without electrolytes. SMOF lipids as TG are elevated and pt is on propofol 20 mcg/kg/min.  ---Add standard MVI, thiamine 100mg  daily x 5 days (day #5) and trace elements to TPN -- Mg 2 g IV x 1 ---continue Sensitive q4h SSI and adjust as needed  + 5 units glargine SQ once daily + 3  units insulin aspart every 4 hours (per NP) ---Monitor TPN labs on Mon/Thurs, daily until stable --completed 5% dextrose at 30 mL/hr for hypernatremia (122 kcal x 24 hours)  Ronnald Ramp, PharmD, BCPS 08/09/2023,9:10 AM

## 2023-08-09 NOTE — Procedures (Signed)
PROCEDURE: BRONCHOSCOPY Therapeutic Aspiration of Tracheobronchial Tree Fiberoptic bronchoscopy with bronchoalveolar lavage  PROCEDURE DATE: 08/09/2023  TIME:  NAME:  [name]  DOB:04-Feb-1956  MRN: [id number] LOC:  IC11A/IC11A-AA    HOSP DAY: @LENGTHOFSTAYDAYS @ CODE STATUS:      Code Status Orders  (From admission, onward)           Start     Ordered   07/28/23 1459  Full code  Continuous       Question:  By:  Answer:  Procedural case: previous code status reviewed   07/28/23 1458           Code Status History     Date Active Date Inactive Code Status Order ID Comments User Context   04/22/2023 1239 05/12/2023 1531 Full Code [id number]  Milinda Antis, PA-C Inpatient   04/22/2023 1239 04/22/2023 1239 Full Code [id number]  Milinda Antis, PA-C Inpatient   04/13/2023 1640 04/22/2023 1237 Full Code [id number]  Lilia Pro, MD ED   04/13/2023 1629 04/13/2023 1640 Full Code [id number]  Erin Fulling, MD ED   08/11/2021 2222 08/16/2021 1950 Full Code [id number]  Andris Baumann, MD ED   03/16/2017 1059 03/17/2017 1935 Full Code [id number]  Schermerhorn, Ihor Austin, MD Inpatient   01/18/2017 1745 01/19/2017 2033 Full Code [id number]  Enedina Finner, MD ED   11/04/2015 1219 11/06/2015 2047 Full Code [id number]  Katha Hamming, MD ED           Indications/Preliminary Diagnosis:   Consent: (Place X beside choice/s below)  The benefits, risks and possible complications of the procedure were        explained to:  ___ patient  __x_ patient's family  ___ other:___________  who verbalized understanding and gave:  ___ verbal  __x_ written  ___ verbal and written  ___ telephone  ___ other:________ consent.      Unable to obtain consent; procedure performed on emergent basis.     Other:       PRESEDATION ASSESSMENT: History and Physical has been performed. Patient meds and allergies have been reviewed. Presedation airway examination has been performed and documented. Baseline  vital signs, sedation score, oxygenation status, and cardiac rhythm were reviewed. Patient was deemed to be in satisfactory condition to undergo the procedure.    PREMEDICATIONS:   Sedative/Narcotic Amt Dose   Versed  mg   Fentanyl gtt mcg  Diprivan gtt mg            PROCEDURE DETAILS: Timeout performed and correct patient, name, & ID confirmed. Following prep per Pulmonary policy, appropriate sedation was administered. The Bronchoscope was inserted in to oral cavity with bite block in place. Therapeutic aspiration of Tracheobronchial tree was performed.  Airway exam proceeded with findings, technical procedures, and specimen collection as noted below. At the end of exam the scope was withdrawn without incident. Impression and Plan as noted below.           Airway Prep (Place X beside choice below)   1% Transtracheal Lidocaine Anesthetization 7 cc   Patient prepped per Bronchoscopy Lab Policy       Insertion Route (Place X beside choice below)   Nasal   Oral  x Endotracheal Tube   Tracheostomy   INTRAPROCEDURE MEDICATIONS:  Sedative/Narcotic Amt Dose   Versed  mg   Fentanyl x mcg  Diprivan x mg       Medication Amt Dose  Medication Amt Dose  Lidocaine 1%  cc  Epinephrine 1:10,000 sol  cc  Xylocaine 4%  cc  Cocaine  cc   TECHNICAL PROCEDURES: (Place X beside choice below)   Procedures  Description    None     Electrocautery     Cryotherapy     Balloon Dilatation     Bronchography     Stent Placement   x  Therapeutic Aspiration Left upper and lower lobes    Laser/Argon Plasma    Brachytherapy Catheter Placement    Foreign Body Removal         SPECIMENS (Sites): (Place X beside choice below)  Specimens Description   No Specimens Obtained     Washings   x Lavage Left lower lobe   Biopsies    Fine Needle Aspirates    Brushings    Sputum    FINDINGS:   Left lung with mucopurulent thick inspissated phelgm.  There were areas of upper and lower lung  which are friable with oozing blood.  This was treated with cold saline and subsided. Mucopurulent aspirate was collected via BAL from LLL and sent for microbiology.   ESTIMATED BLOOD LOSS: none COMPLICATIONS/RESOLUTION: none      IMPRESSION:POST-PROCEDURE DX:   Necrotizing pneumonia of left lung   RECOMMENDATION/PLAN:   Await lung BAL  cultures     Vida Rigger, M.D.  Pulmonary & Critical Care Medicine  [facility]

## 2023-08-09 NOTE — Progress Notes (Signed)
NAME:  [name], MRN:  [id number], DOB:  1955/10/25, LOS: 12 ADMISSION DATE:  07/28/2023, CONSULTATION DATE:  08/02/23 REFERRING MD:  Ernestine Mcmurray CHIEF COMPLAINT:  cervical myelopathy  HPI  [name] is a 68 year old old female with PMHx of hypothyroidism, insulin-dependent DM, CKD stage III, dysphagia with esophageal strictures, facial cellulitis, GERD, neuropathy, arthritis, blood dyscrasia, and recent traumatic cervical central cord injury who presented to the hospital for elective cervical spine surgery.  Per patient's chart, patient was seen by neurosurgery on 07/28/2023 for follow-up after suffering a fall 3 months ago in which she sustained a cervical spinal cord injury.  Per patient, they deferred emergent surgical depression and wanted to let her have time to recover and work with physical therapy.  She continued to have hand weakness as well as lower extremity weakness and ambulatory issues.  Given the ongoing compression of C3-C6, laminoplasty and C2 dome laminectomy was indicated.    Hospital course: Patient underwent C3-6 laminoplasty and C2 dome and laminectomy surgery on 07/29/2023 with drain placement.   Pertinent Labs/Diagnostics Findings: Na+/ K+: 141/4.4 Glucose: 136 BUN/Cr.47/1.42 WBC: 5.8 K/L  Hgb/Hct: 7.4/22.4  Past Medical History  Hypothyroidism, insulin-dependent DM, CKD stage III, dysphagia with esophageal strictures, facial cellulitis, GERD, neuropathy, arthritis, blood dyscrasia, and recent traumatic cervical central cord injury  Significant Hospital Events   01/21: Patient underwent posterior C3-6 Cervical Laminoplasty with C2 inferior dome Laminectomy 01/22: POD #1. Pt. stable with no acute changes.  Working with PT and OT 01/23: POD #2.  Pt. noted to be very lethargic and difficult to arouse 01/24: POD #3. Pt. developed hypoxia tachycardia and shortness of breath.  Hospitalist consulted CT chest obtained and showed possible pneumonia.  Started on IV  antibiotics and DuoNebs. 01/25: POD #4. Pt. with new concerns of not moving the left arm.  CT head and cervical spine obtained with no new concerns.  Follow-up MRI however demonstrated epidural hematoma extending from the cervical spine to the lower thoracic spine with acute compression and T2 signal.  Taken emergently to the OR for surgical decompression 01/26: Pt. transferred to the ICU s/p emergent cervical decompression and evacuation of epidural hematoma.  Remained intubated. PCCM consulted 08/03/23- s/p surgery overnight, alert to self but drowsy with mild AKI and metabolic acidosis.  Lethargy noted have dcd cefepime and started doxycycline instead.  08/04/23- patient more alert able to wiggle toes to verbal.  Remains critically ill.  08/05/23- patient appears to have hypoactive delerium with waxing and waning sensorium.  At times she asks for things other times she's poorly responsive.  Her oxygenation has improved with nasal canula 5L/min.  S/p heme/onc evaluation today 08/06/23- patient seen at bedside, she was able to whisper few words tome.  I discussed PO intake with speech pathologist and SLP with barium was ordered however patient unable to complete this study. She is still very weak physically barely able to wiggle toes.  H/h <7 s/p prbc.  Renal function is improved.  08/07/23- patient had worsening hypoxemia with CXR showing worsening complete left opacification. I discussed this with neurosurgery and they stated her cervical spine did not have any new hardware so intubation should not be complicated. I met with PA Manning Charity at bedside  to review care plan. We discussed these findings with husband and proceeded with intubation and bronchoscopy.  The entire left lung appeared completely full of mucus impaction in both upper and lower lobes during bronchoscopy.  CXR post bronchoscopy is in process.  08/08/23- patient is extubated and is able to speak few words with family.  She moved her feet and  right hand today.  I met with husband and son at bedside reviewed imaging and bloodwork. 08/09/23- patient had severe event overnight with hypoxemia , CXR This am with complete opacification of left lung again.  She went into respiratory distress overnight with severe hypoxemia.  She was emergently intubated overnight. WE notified familly and discussed events and imaging findings. She is s/p bronch with improved CXR post procedure.   Consults:  PCCM Neurosurgery Hospitalist  Procedures:  1/21:posterior C3-6 Cervical Laminoplasty with C2 inferior dome Laminectomy 1/25: s/p Epidural Hematoma Decompression 1/25: Intubation 1/25: Arterial Line  Significant Diagnostic Tests:  1/25:MRI  Thoracic and Cervical Spine> IMPRESSION: 1. Postoperative changes from recent C3 through C6 laminectomy with laminoplasty. Associated fractures better appreciated on prior CT. 2. Dorsal epidural collection extending from C2-3 through T9, likely epidural hematoma. Resultant severe diffuse spinal stenoses stenosis with cord compression at these levels. 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 Emergent neuro surgical 3. Underlying multilevel cervical spondylosis with resultant multilevel foraminal narrowing as above. Notable findings include severe left C4 foraminal stenosis, moderate bilateral C5 foraminal narrowing, with mild bilateral C6 and right C7 foraminal stenosis.  1/25: Noncontrast CT Head &Cervical Spine> IMPRESSION: 1. No acute intracranial abnormality. 2. Mild cortical atrophy, within normal limits for patient age. 3. Improved paranasal sinus mucosal opacification as described above.  1/24: CTA Chest> IMPRESSION: Left lower lobe pneumonia without sizable effusion.   Micro Data:  1/24: Blood culture x2> 1/24: MRSA PCR>> positive 1/24: Strep pneumo urinary antigen>  Antimicrobials:  1/24 Vancomycin >>   1/24 Cefepime >>  OBJECTIVE  Blood pressure (!) 164/72, pulse (!) 122, temperature 98.8 F (37.1 C), resp. rate 16, height 5\' 7"  (1.702 m), weight 58.9 kg, SpO2 100%.  Vent Mode: PRVC FiO2 (%):  [35 %-100 %] 80 % Set Rate:  [16 bmp] 16 bmp Vt Set:  [440 mL] 440 mL PEEP:  [5 cmH20-10 cmH20] 8 cmH20   Intake/Output Summary (Last 24 hours) at 08/09/2023 1022 Last data filed at 08/09/2023 0802 Gross per 24 hour  Intake 3951.44 ml  Output 3635 ml  Net 316.44 ml   Filed Weights   08/07/23 0351 08/08/23 0320 08/09/23 0400  Weight: 62.1 kg 57.8 kg 58.9 kg   Physical Examination  GENERAL: lying down in bed, no apparent distress -encephalopathy noted CVS: RRR, no carotid bruit. LUNGS: decreased air entry on left with rhonchi ABDOMEN: Soft, NTTP EXTREMITIES: no edema or cyanosis NEURO: MENTAL STATUS: sedated on propofol LANG/SPEECH: non-verbal (sedated) CRANIAL NERVES: Pupils are equal and reactive, face symmetric - poor cough and gag to suctioning, rest of cranial nerves were deferred due to sedation. MOTOR: no spontaneous movements - no withdrawal to pain on either side (sedated) REFLEXES: hyporeflexic bilaterally SENSORY: no reaction to pain in both sides GAIT: deferred -  SKIN: intact except as below:      Labs/imaging that I havepersonally reviewed  (right click and "Reselect all SmartList Selections" daily)     Labs   CBC: Recent Labs  Lab 08/05/23 0403 08/06/23 0348 08/06/23 1343 08/06/23 2202 08/07/23 0346 08/07/23 1756 08/08/23 0405 08/09/23 0637  WBC 9.9 10.9*  --   --  12.4*  --  13.0* 10.6*  HGB 7.2* 6.8*   < > 8.5* 8.3* 8.3* 9.0* 7.9*  HCT 20.9* 20.1*   < > 26.3*  25.3* 27.1* 26.4* 25.1*  MCV 89.3 90.1  --   --  89.7  --  94.6 94.4  PLT 235 224  --   --  194  --  201 173   < > = values in this interval not displayed.    Basic Metabolic Panel: Recent Labs  Lab 08/05/23 0403 08/06/23 0348 08/07/23 0346 08/07/23 1150 08/08/23 0405 08/08/23 0641  08/09/23 0351  NA 153* 154* 153* 149* 146*  --  146*  K 4.1 3.8 4.5 5.2* 4.3  --  4.0  CL 125* 128* 127* 126* 125*  --  125*  CO2 18* 19* 16* 14* 14*  --  12*  GLUCOSE 181* 241* 214* 281* 232*  --  128*  BUN 57* 62* 71* 78* 70*  --  68*  CREATININE 1.02* 0.83 0.88 1.08* 0.91  --  0.75  CALCIUM 8.6* 9.1 8.9 9.1 8.7*  --  8.7*  MG 2.8* 2.4 2.1  --   --  1.9 1.6*  PHOS 2.2* 2.2* 3.3  --  2.6  --  2.5   GFR: Estimated Creatinine Clearance: 65.2 mL/min (by C-G formula based on SCr of 0.75 mg/dL). Recent Labs  Lab 08/03/23 1307 08/04/23 0401 08/06/23 0348 08/07/23 0346 08/08/23 0405 08/09/23 0637 08/09/23 0750  WBC  --    < > 10.9* 12.4* 13.0* 10.6*  --   LATICACIDVEN 0.9  --   --   --   --   --  0.8   < > = values in this interval not displayed.    Liver Function Tests: Recent Labs  Lab 08/03/23 0450 08/04/23 0401 08/05/23 0403 08/06/23 0348 08/07/23 0346 08/08/23 0405 08/09/23 0351  AST 22 21 19 20   --   --   --   ALT 16 15 15 16   --   --   --   ALKPHOS 51 51 46 49  --   --   --   BILITOT 0.9 1.5* 1.0 0.7  --   --   --   PROT 5.8* 6.0* 5.7* 6.1*  --   --   --   ALBUMIN 2.6* 2.8* 2.9* 3.2* 3.0* 3.4* 3.1*   No results for input(s): "LIPASE", "AMYLASE" in the last 168 hours. No results for input(s): "AMMONIA" in the last 168 hours.  ABG    Component Value Date/Time   PHART 7.23 (L) 08/09/2023 0637   PCO2ART 31 (L) 08/09/2023 0637   PO2ART 88 08/09/2023 0637   HCO3 13.0 (L) 08/09/2023 0637   ACIDBASEDEF 13.3 (H) 08/09/2023 0637   O2SAT 98.5 08/09/2023 0637     Coagulation Profile: Recent Labs  Lab 08/05/23 1811  INR 1.2    Cardiac Enzymes: No results for input(s): "CKTOTAL", "CKMB", "CKMBINDEX", "TROPONINI" in the last 168 hours.  HbA1C: Hemoglobin A1C  Date/Time Value Ref Range Status  02/20/2013 04:02 AM 10.3 (H) 4.2 - 6.3 % Final    Comment:    The American Diabetes Association recommends that a primary goal of therapy should be <7% and that  physicians should reevaluate the treatment regimen in patients with HbA1c values consistently >8%.    Hgb A1c MFr Bld  Date/Time Value Ref Range Status  04/13/2023 11:34 AM 5.9 (H) 4.8 - 5.6 % Final    Comment:    (NOTE) Pre diabetes:          5.7%-6.4%  Diabetes:              >6.4%  Glycemic control  for   <7.0% adults with diabetes   08/11/2021 08:39 PM 7.2 (H) 4.8 - 5.6 % Final    Comment:    (NOTE) Pre diabetes:          5.7%-6.4%  Diabetes:              >6.4%  Glycemic control for   <7.0% adults with diabetes    CBG: Recent Labs  Lab 08/08/23 1610 08/08/23 1928 08/08/23 2347 08/09/23 0355 08/09/23 0813  GLUCAP 202* 159* 152* 122* 121*    Review of Systems:   Unable to be obtained secondary to the patient's encephalopathy  Past Medical History  She,  has a past medical history of Acute incomplete quadriplegia (HCC), Acute respiratory failure with hypoxia (HCC), AKI (acute kidney injury) (HCC), Arthritis, Blood dyscrasia, Central cord syndrome (HCC), Dental caries, Diabetes mellitus without complication (HCC), Diverticulosis, Dysphagia, Esophageal stricture, Facial cellulitis, GERD (gastroesophageal reflux disease), Hypothyroidism, Neuropathy, Odynophagia, Pelvic fracture (HCC), Renal insufficiency, Spinal cord compression, post-traumatic (HCC), Spinal cord injury, cervical region (HCC), and Stage 3a chronic kidney disease (CKD) (HCC).   Surgical History    Past Surgical History:  Procedure Laterality Date   ABDOMINAL HYSTERECTOMY     BILATERAL SALPINGECTOMY Bilateral 03/16/2017   Procedure: BILATERAL SALPINGECTOMY;  Surgeon: Schermerhorn, Ihor Austin, MD;  Location: [facility] ORS;  Service: Gynecology;  Laterality: Bilateral;   BIOPSY  05/03/2023   Procedure: BIOPSY;  Surgeon: Benancio Deeds, MD;  Location: [facility] ENDOSCOPY;  Service: Gastroenterology;;   COLONOSCOPY WITH PROPOFOL N/A 08/18/2018   Procedure: COLONOSCOPY WITH PROPOFOL;  Surgeon: Scot Jun, MD;   Location: [facility] ENDOSCOPY;  Service: Endoscopy;  Laterality: N/A;   CYSTOCELE REPAIR N/A 03/16/2017   Procedure: ANTERIOR REPAIR (CYSTOCELE);  Surgeon: Schermerhorn, Ihor Austin, MD;  Location: [facility] ORS;  Service: Gynecology;  Laterality: N/A;   ESOPHAGOGASTRODUODENOSCOPY (EGD) WITH PROPOFOL N/A 08/18/2018   Procedure: ESOPHAGOGASTRODUODENOSCOPY (EGD) WITH PROPOFOL;  Surgeon: Scot Jun, MD;  Location: [facility] ENDOSCOPY;  Service: Endoscopy;  Laterality: N/A;   ESOPHAGOGASTRODUODENOSCOPY (EGD) WITH PROPOFOL Left 05/01/2023   Procedure: ESOPHAGOGASTRODUODENOSCOPY (EGD) WITH PROPOFOL;  Surgeon: Benancio Deeds, MD;  Location: [facility] ENDOSCOPY;  Service: Gastroenterology;  Laterality: Left;   ESOPHAGOGASTRODUODENOSCOPY (EGD) WITH PROPOFOL N/A 05/03/2023   Procedure: ESOPHAGOGASTRODUODENOSCOPY (EGD) WITH PROPOFOL;  Surgeon: Benancio Deeds, MD;  Location[facility] ENDOSCOPY;  Service: Gastroenterology;  Laterality: N/A;   POSTERIOR CERVICAL LAMINECTOMY N/A 07/28/2023   Procedure: C2 DOME LAMINECTOMY;  Surgeon: [name], MD;  Location: [facility] ORS;  Service: Neurosurgery;  Laterality: N/A;   POSTERIOR CERVICAL LAMINECTOMY N/A 08/01/2023   Procedure: POSTERIOR CERVICAL LAMINECTOMY;  Surgeon: [name], MD;  Location: [facility] ORS;  Service: Neurosurgery;  Laterality: N/A;   TEAR DUCT PROBING Right    TUBAL LIGATION     VAGINAL HYSTERECTOMY N/A 03/16/2017   Procedure: HYSTERECTOMY VAGINAL;  Surgeon: Schermerhorn, Ihor Austin, MD;  Location: [facility] ORS;  Service: Gynecology;  Laterality: N/A;   WISDOM TOOTH EXTRACTION       Social History   reports that she has never smoked. She has never used smokeless tobacco. She reports that she does not drink alcohol and does not use drugs.   Family History   Her family history includes Breast cancer in her maternal aunt; Diabetes in her father; Hypertension in her father and mother.   Allergies Allergies  Allergen Reactions   Amoxicillin-Pot Clavulanate  Itching and Anaphylaxis   Ampicillin Rash     Home Medications  Prior to Admission medications  Medication Sig Start Date End Date Taking? Authorizing Provider  acetaminophen (TYLENOL) 650 MG CR tablet Take 650 mg by mouth every 8 (eight) hours as needed for pain.   Yes [provider]  chlorhexidine (HIBICLENS) 4 % external liquid Apply 15 mLs (1 Application total) topically as directed for 30 doses. Use as directed daily for 5 days every other week for 6 weeks. 07/28/23  Yes Joan Flores, PA-C  cyanocobalamin (VITAMIN B12) 1000 MCG tablet Take 1,000 mcg by mouth daily.   Yes [provider]  cyclobenzaprine (FLEXERIL) 10 MG tablet Take 1 tablet (10 mg total) by mouth at bedtime. 06/15/23  Yes Lovorn, Aundra Millet, MD  diclofenac Sodium (VOLTAREN) 1 % GEL Apply 2 g topically 4 (four) times daily. Patient taking differently: Apply 2 g topically 4 (four) times daily as needed (pain). 05/11/23  Yes Setzer, Lynnell Jude, PA-C  fludrocortisone (FLORINEF) 0.1 MG tablet Take 2 tablets (0.2 mg total) by mouth daily. For orthostatic hypotension 06/15/23  Yes Lovorn, Aundra Millet, MD  gabapentin (NEURONTIN) 100 MG capsule Take 200 mg by mouth 2 (two) times daily.  08/16/15  Yes [provider]  glipiZIDE (GLUCOTROL) 5 MG tablet Take 10 mg by mouth daily before breakfast.   Yes [provider]  JARDIANCE 25 MG TABS tablet Take 25 mg by mouth daily.   Yes [provider]  lactase (LACTAID) 3000 units tablet Take 3,000 Units by mouth daily before breakfast.   Yes [provider]  levothyroxine (SYNTHROID) 88 MCG tablet Take 88 mcg by mouth daily before breakfast.   Yes [provider]  midodrine (PROAMATINE) 10 MG tablet Take 1.5 tablets (15 mg total) by mouth 3 (three) times daily with meals. For orthostatic hypotension 06/15/23  Yes Lovorn, Aundra Millet, MD  Multiple Vitamin (MULTIVITAMIN WITH MINERALS) TABS tablet Take 1 tablet by mouth daily. 04/22/23  Yes Sunnie Nielsen, DO  mupirocin ointment (BACTROBAN) 2 % Place 1 Application into the nose 2 (two) times daily for 60 doses. Use as directed 2 times daily for 5 days every other week for 6 weeks. 07/28/23 08/27/23 Yes Joan Flores, PA-C  pioglitazone (ACTOS) 30 MG tablet Take 30 mg by mouth daily.   Yes [provider]  Psyllium (METAMUCIL 3 IN 1 DAILY FIBER PO) Take 2 capsules by mouth daily.   Yes [provider]  rosuvastatin (CRESTOR) 5 MG tablet Take 5 mg by mouth at bedtime.   Yes [provider]  senna (SENOKOT) 8.6 MG TABS tablet Take 1 tablet (8.6 mg total) by mouth at bedtime. 05/11/23  Yes Setzer, Lynnell Jude, PA-C  Scheduled Meds:  albuterol  2.5 mg Nebulization TID   Chlorhexidine Gluconate Cloth  6 each Topical Daily   docusate  100 mg Per Tube BID   enoxaparin (LOVENOX) injection  40 mg Subcutaneous Q24H   insulin aspart  0-20 Units Subcutaneous Q4H   insulin aspart  3 Units Subcutaneous Q4H   insulin glargine-yfgn  5 Units Subcutaneous Daily   levothyroxine  88 mcg Per Tube QAC breakfast   mupirocin ointment  1 Application Nasal BID   mouth rinse  15 mL Mouth Rinse Q2H   polyethylene glycol  17 g Per Tube Daily   sodium chloride flush  10-40 mL Intracatheter Q12H   sodium chloride flush  3-10 mL Intravenous Q12H   sodium chloride HYPERTONIC  4 mL Nebulization TID   Continuous Infusions:  albumin human Stopped (08/08/23 1141)   cefTRIAXone (ROCEPHIN)  IV Stopped (08/08/23 1451)  doxycycline (VIBRAMYCIN) IV Stopped (08/09/23 0704)   TPN (CLINIMIX) Adult without lytes     And   fat emul(SMOFlipid)     fentaNYL infusion INTRAVENOUS 25 mcg/hr (08/09/23 0721)   magnesium sulfate bolus IVPB 2 g (08/09/23 0941)   metronidazole Stopped (08/09/23 0981)   phenylephrine (NEO-SYNEPHRINE) Adult infusion 60 mcg/min (08/09/23 0802)   propofol (DIPRIVAN) infusion 5 mcg/kg/min (08/09/23 0719)   TPN (CLINIMIX) Adult without lytes 42 mL/hr at 08/09/23 0802   PRN  Meds:.alum & mag hydroxide-simeth, benzonatate, bisacodyl, menthol-cetylpyridinium **OR** [DISCONTINUED] phenol, mouth rinse, senna-docusate, sodium chloride flush, sodium chloride flush  CXR before and after bronchoscopy     CXR PRE AND POST BRONCH #2     Assessment & Plan:  #Acute Hypoxic Respiratory Failure #HCAP   Complete opacification of left lung   Ipsilateral deviation of trachea consistent with mucus plugging  -s/p bronchoscopy with marked improvement.  -currently on [facility] - patient very frail and unable to clear secretions of cough -chest physiotherapy with bed PT -Wean FiO2 as able, ventilator mechanics reviewed with improved resistance and compliance -Full MV support on low PEEP ARDSnet ladder    Encephalopathy   -  dcd cefepime   - metabolic component -    - HAGMA improved   -patient with delerium has moments of improved sensorium moving peripheries and mouthing words vs very poorly responsive.     #Cervical Myelopathy PMHx: Cervical stenosis, Cervical Spinal Cord Injury s/p fall s/p C2 dome laminectomy, C3-6 laminoplasty on 1/21 now complicated by Epidural Hematoma s/p surgical decompression 1/26 -Neurosurgery following, apprec ~ Follow recommendation as below: -Vasopressors as needed to maintain MAP goal >85, now >65 per Dr Katrinka Blazing -Hold midodrine and Florinef while on pressors -Neuro checks q2h -PT/OT once stable -Foley catheter for urinary retention    #Severe CAP CT chest shows Left lower lobe pneumonia -Monitor fever curve -Trend WBC's & Procalcitonin -Follow cultures as above -Continue empiric abx ppx pending cultures & sensitivities -Rocephin /Doxy  #Acute Blood Loss Anemia Post Op #Hx of Blood Dyscrasia-Heme onc on case appreciate input -Monitor for S/Sx of bleeding -Trend CBC (H&H q6h) -SCD's for VTE Prophylaxis (chemical ppx contraindicated) -Transfuse for Hgb <7  s/p blood transfusion  #AKI on CKD stage III -Monitor I&O's / urinary  output -Follow BMP -Ensure adequate renal perfusion -Avoid nephrotoxic agents as able -Replace electrolytes as indicated  #T2DM -HgbA1c goal  -CBG's q4; Target range of 140 to 180 -SSI -Follow ICU Hypo/Hyperglycemia protocol -Hold home Glipizide     #Hypothyroidism -Check TSH, Free T4 -continue home synthroid  Best practice:  Diet:  NPO Pain/Anxiety/Delirium protocol (if indicated): Yes (RASS goal -1) VAP protocol (if indicated): Yes DVT prophylaxis: Contraindicated GI prophylaxis: H2B Glucose control:  SSI Yes Central venous access:  N/A Arterial line:  Yes, and it is still needed Foley:  Yes, and it is still needed Mobility:  bed rest  PT consulted: N/A Last date of multidisciplinary goals of care discussion []  Code Status:  full code Disposition: ICU   = Goals of Care = Code Status Order: FULL  Primary Emergency Contact: Soldo,Timothy S, Home Phone: [phone] Wishes to pursue full aggressive treatment and intervention options, including CPR and intubation, but goals of care will be addressed on going with family if that should become necessary.  Critical care provider statement:   Total critical care time: 31 minutes   Performed by: Karna Christmas MD   Critical care time was exclusive of separately billable procedures and treating other patients.  Critical care was necessary to treat or prevent imminent or life-threatening deterioration.   Critical care was time spent personally by me on the following activities: development of treatment plan with patient and/or surrogate as well as nursing, discussions with consultants, evaluation of patient's response to treatment, examination of patient, obtaining history from patient or surrogate, ordering and performing treatments and interventions, ordering and review of laboratory studies, ordering and review of radiographic studies, pulse oximetry and re-evaluation of patient's condition.    Vida Rigger, M.D.   Pulmonary & Critical Care Medicine

## 2023-08-09 NOTE — Plan of Care (Signed)
  Problem: Education: Goal: Ability to describe self-care measures that may prevent or decrease complications (Diabetes Survival Skills Education) will improve Outcome: Not Progressing   Problem: Coping: Goal: Ability to adjust to condition or change in health will improve Outcome: Not Progressing   Problem: Fluid Volume: Goal: Ability to maintain a balanced intake and output will improve Outcome: Not Progressing   Problem: Health Behavior/Discharge Planning: Goal: Ability to identify and utilize available resources and services will improve Outcome: Not Progressing Goal: Ability to manage health-related needs will improve Outcome: Not Progressing   Problem: Skin Integrity: Goal: Risk for impaired skin integrity will decrease Outcome: Not Progressing   Problem: Tissue Perfusion: Goal: Adequacy of tissue perfusion will improve Outcome: Not Progressing   Problem: Health Behavior/Discharge Planning: Goal: Ability to manage health-related needs will improve Outcome: Not Progressing   Problem: Clinical Measurements: Goal: Ability to maintain clinical measurements within normal limits will improve Outcome: Not Progressing Goal: Will remain free from infection Outcome: Not Progressing Goal: Diagnostic test results will improve Outcome: Not Progressing Goal: Respiratory complications will improve Outcome: Not Progressing Goal: Cardiovascular complication will be avoided Outcome: Not Progressing   Problem: Activity: Goal: Risk for activity intolerance will decrease Outcome: Not Progressing   Problem: Nutrition: Goal: Adequate nutrition will be maintained Outcome: Not Progressing   Problem: Coping: Goal: Level of anxiety will decrease Outcome: Not Progressing   Problem: Elimination: Goal: Will not experience complications related to bowel motility Outcome: Not Progressing Goal: Will not experience complications related to urinary retention Outcome: Not Progressing    Problem: Pain Managment: Goal: General experience of comfort will improve and/or be controlled Outcome: Not Progressing   Problem: Safety: Goal: Ability to remain free from injury will improve Outcome: Not Progressing   Problem: Skin Integrity: Goal: Risk for impaired skin integrity will decrease Outcome: Not Progressing   Problem: Education: Goal: Ability to verbalize activity precautions or restrictions will improve Outcome: Not Progressing Goal: Knowledge of the prescribed therapeutic regimen will improve Outcome: Not Progressing Goal: Understanding of discharge needs will improve Outcome: Not Progressing   Problem: Activity: Goal: Ability to avoid complications of mobility impairment will improve Outcome: Not Progressing Goal: Ability to tolerate increased activity will improve Outcome: Not Progressing Goal: Will remain free from falls Outcome: Not Progressing   Problem: Bowel/Gastric: Goal: Gastrointestinal status for postoperative course will improve Outcome: Not Progressing   Problem: Clinical Measurements: Goal: Ability to maintain clinical measurements within normal limits will improve Outcome: Not Progressing Goal: Postoperative complications will be avoided or minimized Outcome: Not Progressing Goal: Diagnostic test results will improve Outcome: Not Progressing   Problem: Pain Management: Goal: Pain level will decrease Outcome: Not Progressing   Problem: Skin Integrity: Goal: Will show signs of wound healing Outcome: Not Progressing   Problem: Health Behavior/Discharge Planning: Goal: Identification of resources available to assist in meeting health care needs will improve Outcome: Not Progressing   Problem: Bladder/Genitourinary: Goal: Urinary functional status for postoperative course will improve Outcome: Not Progressing

## 2023-08-10 ENCOUNTER — Encounter: Payer: Medicare HMO | Admitting: Physician Assistant

## 2023-08-10 ENCOUNTER — Inpatient Hospital Stay: Payer: Medicare Other

## 2023-08-10 DIAGNOSIS — E872 Acidosis, unspecified: Secondary | ICD-10-CM

## 2023-08-10 DIAGNOSIS — T148XXA Other injury of unspecified body region, initial encounter: Secondary | ICD-10-CM | POA: Diagnosis not present

## 2023-08-10 DIAGNOSIS — G959 Disease of spinal cord, unspecified: Secondary | ICD-10-CM | POA: Diagnosis not present

## 2023-08-10 DIAGNOSIS — E440 Moderate protein-calorie malnutrition: Secondary | ICD-10-CM

## 2023-08-10 DIAGNOSIS — S14105S Unspecified injury at C5 level of cervical spinal cord, sequela: Secondary | ICD-10-CM

## 2023-08-10 DIAGNOSIS — J690 Pneumonitis due to inhalation of food and vomit: Secondary | ICD-10-CM

## 2023-08-10 DIAGNOSIS — G928 Other toxic encephalopathy: Secondary | ICD-10-CM

## 2023-08-10 DIAGNOSIS — J9601 Acute respiratory failure with hypoxia: Secondary | ICD-10-CM | POA: Diagnosis not present

## 2023-08-10 LAB — BLOOD GAS, ARTERIAL
Acid-base deficit: 13.3 mmol/L — ABNORMAL HIGH (ref 0.0–2.0)
Bicarbonate: 13 mmol/L — ABNORMAL LOW (ref 20.0–28.0)
O2 Saturation: 98.5 %
Patient temperature: 37
pCO2 arterial: 31 mm[Hg] — ABNORMAL LOW (ref 32–48)
pH, Arterial: 7.23 — ABNORMAL LOW (ref 7.35–7.45)
pO2, Arterial: 88 mm[Hg] (ref 83–108)

## 2023-08-10 LAB — CBC
HCT: 27 % — ABNORMAL LOW (ref 36.0–46.0)
Hemoglobin: 8.9 g/dL — ABNORMAL LOW (ref 12.0–15.0)
MCH: 32 pg (ref 26.0–34.0)
MCHC: 33 g/dL (ref 30.0–36.0)
MCV: 97.1 fL (ref 80.0–100.0)
Platelets: 188 10*3/uL (ref 150–400)
RBC: 2.78 MIL/uL — ABNORMAL LOW (ref 3.87–5.11)
RDW: 15.7 % — ABNORMAL HIGH (ref 11.5–15.5)
WBC: 24.4 10*3/uL — ABNORMAL HIGH (ref 4.0–10.5)
nRBC: 0.4 % — ABNORMAL HIGH (ref 0.0–0.2)

## 2023-08-10 LAB — COMPREHENSIVE METABOLIC PANEL
ALT: 14 U/L (ref 0–44)
AST: 25 U/L (ref 15–41)
Albumin: 3.2 g/dL — ABNORMAL LOW (ref 3.5–5.0)
Alkaline Phosphatase: 59 U/L (ref 38–126)
Anion gap: 9 (ref 5–15)
BUN: 80 mg/dL — ABNORMAL HIGH (ref 8–23)
CO2: 14 mmol/L — ABNORMAL LOW (ref 22–32)
Calcium: 8.2 mg/dL — ABNORMAL LOW (ref 8.9–10.3)
Chloride: 119 mmol/L — ABNORMAL HIGH (ref 98–111)
Creatinine, Ser: 1.56 mg/dL — ABNORMAL HIGH (ref 0.44–1.00)
GFR, Estimated: 37 mL/min — ABNORMAL LOW (ref >60)
Glucose, Bld: 174 mg/dL — ABNORMAL HIGH (ref 70–99)
Potassium: 4.6 mmol/L (ref 3.5–5.1)
Sodium: 142 mmol/L (ref 135–145)
Total Bilirubin: 0.6 mg/dL (ref 0.0–1.2)
Total Protein: 6 g/dL — ABNORMAL LOW (ref 6.5–8.1)

## 2023-08-10 LAB — FACTOR 10 ASSAY: Factor X Activity: 67 % — ABNORMAL LOW (ref 76–183)

## 2023-08-10 LAB — PT FACTOR INHIBITOR (MIXING STUDY)
PT 1:1NP: 10.6 s (ref 9.1–12.0)
PT: 10.9 s (ref 9.1–12.0)

## 2023-08-10 LAB — GLUCOSE, CAPILLARY
Glucose-Capillary: 132 mg/dL — ABNORMAL HIGH (ref 70–99)
Glucose-Capillary: 153 mg/dL — ABNORMAL HIGH (ref 70–99)
Glucose-Capillary: 153 mg/dL — ABNORMAL HIGH (ref 70–99)
Glucose-Capillary: 155 mg/dL — ABNORMAL HIGH (ref 70–99)
Glucose-Capillary: 194 mg/dL — ABNORMAL HIGH (ref 70–99)
Glucose-Capillary: 213 mg/dL — ABNORMAL HIGH (ref 70–99)
Glucose-Capillary: 96 mg/dL (ref 70–99)

## 2023-08-10 LAB — VON WILLEBRAND PANEL
Coagulation Factor VIII: 211 % — ABNORMAL HIGH (ref 56–140)
Ristocetin Co-factor, Plasma: 337 % — ABNORMAL HIGH (ref 50–200)
Von Willebrand Antigen, Plasma: 307 % — ABNORMAL HIGH (ref 50–200)

## 2023-08-10 LAB — COAG STUDIES INTERP REPORT

## 2023-08-10 LAB — FACTOR 12 ASSAY: Factor XII Activity: 44 % — ABNORMAL LOW (ref 50–150)

## 2023-08-10 LAB — CULTURE, BAL-QUANTITATIVE W GRAM STAIN: Culture: NO GROWTH — AB

## 2023-08-10 LAB — FACTOR 11 ASSAY: Factor XI Activity: 101 % (ref 60–150)

## 2023-08-10 LAB — FACTOR 5 ASSAY: Factor V Activity: 82 % (ref 70–150)

## 2023-08-10 LAB — FACTOR 2 ASSAY: Factor II Activity: 78 % (ref 50–154)

## 2023-08-10 LAB — PTT FACTOR INHIBITOR (MIXING STUDY): aPTT: 22.6 s — ABNORMAL LOW (ref 22.9–30.2)

## 2023-08-10 LAB — PHOSPHORUS: Phosphorus: 4 mg/dL (ref 2.5–4.6)

## 2023-08-10 LAB — FACTOR 9 ASSAY: Coagulation Factor IX: 202 % — ABNORMAL HIGH (ref 60–177)

## 2023-08-10 LAB — FACTOR 7 ASSAY: Factor VII Activity: 156 % (ref 51–186)

## 2023-08-10 LAB — TRIGLYCERIDES: Triglycerides: 604 mg/dL — ABNORMAL HIGH (ref <150)

## 2023-08-10 LAB — MAGNESIUM: Magnesium: 2.7 mg/dL — ABNORMAL HIGH (ref 1.7–2.4)

## 2023-08-10 MED ORDER — DEXMEDETOMIDINE HCL IN NACL 400 MCG/100ML IV SOLN
INTRAVENOUS | Status: AC
  Filled 2023-08-10: qty 100

## 2023-08-10 MED ORDER — VANCOMYCIN VARIABLE DOSE PER UNSTABLE RENAL FUNCTION (PHARMACIST DOSING): Status: DC

## 2023-08-10 MED ORDER — SODIUM BICARBONATE 8.4 % IV SOLN
INTRAVENOUS | Status: DC
  Filled 2023-08-10: qty 1000

## 2023-08-10 MED ORDER — MIDAZOLAM HCL 2 MG/2ML IJ SOLN
2.0000 mg | Freq: Once | INTRAMUSCULAR | Status: AC
  Administered 2023-08-10: 2 mg via INTRAVENOUS

## 2023-08-10 MED ORDER — LACTATED RINGERS IV BOLUS
500.0000 mL | Freq: Once | INTRAVENOUS | Status: AC
  Administered 2023-08-10: 500 mL via INTRAVENOUS

## 2023-08-10 MED ORDER — SODIUM CHLORIDE 0.9 % IV SOLN
2.0000 g | Freq: Two times a day (BID) | INTRAVENOUS | Status: AC
  Administered 2023-08-10 – 2023-08-16 (×13): 2 g via INTRAVENOUS
  Filled 2023-08-10 (×14): qty 2

## 2023-08-10 MED ORDER — STERILE WATER FOR INJECTION IV SOLN
INTRAVENOUS | Status: DC
  Filled 2023-08-10: qty 1000

## 2023-08-10 MED ORDER — DEXMEDETOMIDINE HCL IN NACL 400 MCG/100ML IV SOLN
0.0000 ug/kg/h | INTRAVENOUS | Status: DC
  Administered 2023-08-10 – 2023-08-11 (×2): 0.4 ug/kg/h via INTRAVENOUS
  Administered 2023-08-11: 0.6 ug/kg/h via INTRAVENOUS
  Filled 2023-08-10 (×3): qty 100

## 2023-08-10 MED ORDER — VASOPRESSIN 20 UNITS/100 ML INFUSION FOR SHOCK
0.0000 [IU]/min | INTRAVENOUS | Status: DC
  Administered 2023-08-10 – 2023-08-16 (×16): 0.03 [IU]/min via INTRAVENOUS
  Filled 2023-08-10 (×16): qty 100

## 2023-08-10 MED ORDER — PANTOPRAZOLE SODIUM 40 MG IV SOLR
40.0000 mg | INTRAVENOUS | Status: DC
Start: 2023-08-10 — End: 2023-08-12
  Administered 2023-08-10 – 2023-08-12 (×3): 40 mg via INTRAVENOUS
  Filled 2023-08-10 (×3): qty 10

## 2023-08-10 MED ORDER — VANCOMYCIN HCL 1500 MG/300ML IV SOLN
1500.0000 mg | Freq: Once | INTRAVENOUS | Status: AC
  Administered 2023-08-10: 1500 mg via INTRAVENOUS
  Filled 2023-08-10: qty 300

## 2023-08-10 MED ORDER — SODIUM BICARBONATE 8.4 % IV SOLN
50.0000 meq | Freq: Once | INTRAVENOUS | Status: AC
Start: 2023-08-10 — End: 2023-08-10
  Administered 2023-08-10: 50 meq via INTRAVENOUS

## 2023-08-10 MED ORDER — MIDAZOLAM HCL 2 MG/2ML IJ SOLN
INTRAMUSCULAR | Status: AC
  Filled 2023-08-10: qty 2

## 2023-08-10 MED ORDER — STERILE WATER FOR INJECTION IV SOLN
INTRAVENOUS | Status: DC
  Filled 2023-08-10 (×2): qty 1000

## 2023-08-10 MED ORDER — TRAVASOL 10 % IV SOLN
INTRAVENOUS | Status: DC
  Filled 2023-08-10: qty 1080

## 2023-08-10 MED ORDER — HEPARIN SODIUM (PORCINE) 5000 UNIT/ML IJ SOLN
5000.0000 [IU] | Freq: Two times a day (BID) | INTRAMUSCULAR | Status: DC
  Administered 2023-08-10 – 2023-08-11 (×3): 5000 [IU] via SUBCUTANEOUS
  Filled 2023-08-10 (×3): qty 1

## 2023-08-10 NOTE — Progress Notes (Signed)
Neurosurgery Progress Note  History: [name] is here for C2 dome laminectomy, C3-6 laminoplasty and now s/p evacuation hematoma  POD12/9: Pt had neuro change early this morning prompting CT head and C spine.  POD 11/7: Was extubated this morning prior to evaluation.  Was able to track with eyes but was quite somnolent still.  Was reacting to painful stimuli in the bilateral lower extremities.  Left upper extremity was reacting to painful stimuli as well. POD 10/6: Pt experiencing increased work of breathing this morning and worsening findings on chest xray resulting in reintubation this morning. She is currently undergoing bronchoscopy by Dr. Karna [name]  POD 9/5: Pt more alert this morning asking what day it is  POD 8/4: Very drowsy but arouses this morning to voice.  POD 7/3: Pt is very drowsy, but able to follow some commands  POD6/2: Pt is drowsy this morning asking "what do you want" when awaken  Interval POD5: [name] had a decline yesterday with her left upper extremity and then bilateral lower extremities.  CT scan was unremarkable MRI did show a large epidural hematoma.  She was taken emergently to the OR by Dr. Katrinka [name] for decompression.  She has been monitored in the ICU overnight. She is still intubated and on sedation. She is on pressors to maintain [facility]   Hospital Course POD4: Ms [name] is more awake today. She states she isnt having a lot of pain but she is thirsty.  POD3:Patient more alert this AM. However; hypoxia, tachycardia, SOB, low energy.  POD2: Very lethargic and difficult to arouse, states in quite a bit of pain. Medicine had been held due to sleepiness.    Physical Exam: Vitals:   08/10/23 0630 08/10/23 0645  BP:    Pulse: (!) 112 (!) 111  Resp: 17 16  Temp: 99 F (37.2 C) 99 F (37.2 C)  SpO2: 100% 100%    Intubated. Opens eyes to voice Does not withdrawal in extremities to pain Incision covered with clean dressing   Data:  Output by Drain  (mL) 08/08/23 0701 - 08/08/23 1900 08/08/23 1901 - 08/09/23 0700 08/09/23 0701 - 08/09/23 1900 08/09/23 1901 - 08/10/23 0700 08/10/23 0701 - 08/10/23 0750  Requested LDAs do not have output data documented.     Assessment/Plan:  [name]  s/p C2 dome laminectomy, C3-6 laminoplasty  On 1/21 with return to OR on 1/25 for cervical/thoracic laminectomy and epidural hematoma evacuation.  She has had a complicated postoperative course with a spontaneous cervical thoracic hemorrhage on 125 which was taken emergently for decompression.  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  - If exam remains poor with holding sedation, recommend MRI head, C, T and L spine -No further MAP management goals from neurosurgical standpoint - pain control continue with IV and oral medications -Given her immobilized status and risk for DVT PE we are resuming anticoagulation knowing that she would be at risk for repeat hemorrhage.  - PT/OT when able - HV removed on 1/29 - PICC placement and TPN started.  - pneumonia; recommendations per CC  - CC and IM assisting with non surgical management. We appreciate your assistance.   Manning Charity PA-C Department of Neurosurgery   Addendum:   I stopped by this evening to discuss the MRI findings with the family.  As you can see below I have copy and pasted the formal reads.  As a short update, she was noticed to have an additional decline in her neurologic exam recently and because of this we obtained CT head and spine as well as MRI of the neural axis to evaluate for any reaccumulation of bleeding, ischemic insult, or any other lesion that could be explaining her decline.  The MRI of the cervical spine and thoracic spine was the most revealing.  It showed severe swelling of the spinal cord and mostly a central distribution, concern for possible ischemic  versus post compressive edema.  We had a spontaneous breathing trial which demonstrated that she was pulling adequate lung volumes on her own with a pressure support of 5 and 5.  A complete spinal cord infarct would generally cause her to lose respiratory drive as it would interrupt the phrenic innervation, because of this would like to obtain a DWI MRI as discussed in the recent read.  We discussed with the family that should this be a spinal cord infarct the likelihood for any meaningful neurologic recovery is quite low and that she would likely require ventilatory support as well as feeding support.  She is currently being worked up for enteric access for nutritional support.  We discussed that the massive nature of her spontaneous delayed postoperative hematoma may be too much for her spinal cord to recover from.  The family remains hopeful, we discussed these findings with her husband, brother, and son at bedside.  We will continue to follow-up after the DWI images have been obtained.  At this point we do not feel there is a clear indication for steroid use after a post compressive spinal cord injury as an most studies this has been mostly correlated with an increased mortality.   MR BRAIN WO CONTRAST Result Date: 08/10/2023 CLINICAL DATA:  Trauma EXAM: MRI HEAD WITHOUT CONTRAST MRI CERVICAL SPINE WITHOUT CONTRAST MRI THORACIC SPINE WITHOUT CONTRAST MRI LUMBAR SPINE WITHOUT CONTRAST TECHNIQUE: Multiplanar, multiecho pulse sequences of the brain and surrounding structures, and cervical and thoracic spine and lumbar spine were obtained without intravenous contrast. COMPARISON:  Cervical spine MRI 08/01/2023, thoracic spine MRI 08/01/2023 FINDINGS: MRI HEAD FINDINGS Brain: Negative for an acute infarct. No hemorrhage. No hydrocephalus. No extra-axial fluid collection. No mass effect. No mass lesion. Cavum septum pellucidum et vergae. Background of mild chronic microvascular ischemic change. Small area of  susceptibility artifact in the left insular region (series 10, image 34) is nonspecific and may represent sequela of chronic hemorrhage. Recommend short term follow up head CT to exclude the possibility of subarachnoid hemorrhage. Vascular: Normal flow voids. Skull and upper cervical spine: See below for cervical spine findings. Sinuses/Orbits: Moderate-to-large bilateral mastoid effusions. Layering secretions in the oropharynx. Mild mucosal thickening in the bilateral ethmoid sinuses. Orbits are unremarkable. Other: None. MRI CERVICAL SPINE FINDINGS Alignment: Physiologic. Vertebrae: No fracture, evidence of discitis, or bone lesion. There are postsurgical changes from posterior decompression at C3-C6. Cord: Compared to prior exam there is new T2 hyperintense cord signal abnormality spanning involving nearly the entire cervical and thoracic spinal cord. Posterior Fossa, vertebral arteries, paraspinal tissues: There is nonspecific fluid along the laminectomy site that likely communicates with the skin surface (series 12, image 24-26). Endotracheal tube in place. Disc levels: There is a no evidence of high grade spinal canal stenosis. There has been interval evacuation of the previously seen dorsal epidural hematoma MRI THORACIC SPINE FINDINGS Alignment:  Physiologic. Vertebrae: No fracture, evidence of discitis, or bone lesion. Cord: There is T2 hyperintense signal abnormality spanning the  entire thoracic spinal cord Paraspinal and other soft tissues: There is mild spinal canal narrowing of the T1-T2 level secondary to mass effect on the dorsal aspect of the thecal sac from the fluid collection at the laminectomy site. Disc levels: There is a small residual fluid collection in the dorsal epidural space of the T6-T7 level (series 20, 20 image) measuring 4 x 6 mm. This could represent spinal residual blood products. Note that sterility of this collection is difficult to assess in the absence of IV contrast. Additional  T2 hypointense material is also present in the dorsal epidural space spanning the T6-T9 levels, compatible with a residual blood products. MRI LUMBAR SPINE: Segmentation:  Standard. Alignment:  Physiologic. Vertebrae:  No fracture, evidence of discitis, or bone lesion. Conus medullaris and cauda equina: Conus extends to the L1-L2 disc space level. Conus and cauda equina appear normal. Paraspinal and other soft tissues: Redemonstrated nonspecific calcification and soft tissue in the retroperitoneum on the left. See same day CT abdomen and pelvis for additional findings Disc levels: No evidence of high-grade spinal canal or neural foraminal stenosis. IMPRESSION: MRI HEAD: 1.  Negative for an acute infarct. 2. Small area of susceptibility artifact in the left insular region is nonspecific and may represent sequela of chronic hemorrhage. Recommend short term follow up head CT to exclude the possibility of subarachnoid hemorrhage. MRI CERVICAL AND THORACIC SPINE: 1. Interval evacuation of the previously seen dorsal epidural hematoma in the cervical and thoracic spine. No evidence of residual high grade spinal canal stenosis. There are residual blood products spanning the T6-T9 level. 2. New T2 hyperintense cord signal abnormality spanning involving nearly the entire cervical and thoracic spinal cord, which is concerning for spinal cord infarct and/or edema. DWI sequences of the cervical and thoracic spine could be obtained for further characterization. 3. Nonspecific fluid along the laminectomy site that likely communicates with the skin surface. Correlate for clinical signs of infection. MRI LUMBAR SPINE: No acute fracture or traumatic malalignment of the lumbar spine. Findings were discussed with Dr. Elvina Sidle on 08/10/23 at 4:01 PM. Electronically Signed   By: Lorenza Cambridge M.D.   On: 08/10/2023 16:02   MR CERVICAL SPINE WO CONTRAST Result Date: 08/10/2023 CLINICAL DATA:  Trauma EXAM: MRI HEAD WITHOUT CONTRAST MRI  CERVICAL SPINE WITHOUT CONTRAST MRI THORACIC SPINE WITHOUT CONTRAST MRI LUMBAR SPINE WITHOUT CONTRAST TECHNIQUE: Multiplanar, multiecho pulse sequences of the brain and surrounding structures, and cervical and thoracic spine and lumbar spine were obtained without intravenous contrast. COMPARISON:  Cervical spine MRI 08/01/2023, thoracic spine MRI 08/01/2023 FINDINGS: MRI HEAD FINDINGS Brain: Negative for an acute infarct. No hemorrhage. No hydrocephalus. No extra-axial fluid collection. No mass effect. No mass lesion. Cavum septum pellucidum et vergae. Background of mild chronic microvascular ischemic change. Small area of susceptibility artifact in the left insular region (series 10, image 34) is nonspecific and may represent sequela of chronic hemorrhage. Recommend short term follow up head CT to exclude the possibility of subarachnoid hemorrhage. Vascular: Normal flow voids. Skull and upper cervical spine: See below for cervical spine findings. Sinuses/Orbits: Moderate-to-large bilateral mastoid effusions. Layering secretions in the oropharynx. Mild mucosal thickening in the bilateral ethmoid sinuses. Orbits are unremarkable. Other: None. MRI CERVICAL SPINE FINDINGS Alignment: Physiologic. Vertebrae: No fracture, evidence of discitis, or bone lesion. There are postsurgical changes from posterior decompression at C3-C6. Cord: Compared to prior exam there is new T2 hyperintense cord signal abnormality spanning involving nearly the entire cervical and thoracic spinal cord. Posterior Fossa, vertebral  arteries, paraspinal tissues: There is nonspecific fluid along the laminectomy site that likely communicates with the skin surface (series 12, image 24-26). Endotracheal tube in place. Disc levels: There is a no evidence of high grade spinal canal stenosis. There has been interval evacuation of the previously seen dorsal epidural hematoma MRI THORACIC SPINE FINDINGS Alignment:  Physiologic. Vertebrae: No fracture,  evidence of discitis, or bone lesion. Cord: There is T2 hyperintense signal abnormality spanning the entire thoracic spinal cord Paraspinal and other soft tissues: There is mild spinal canal narrowing of the T1-T2 level secondary to mass effect on the dorsal aspect of the thecal sac from the fluid collection at the laminectomy site. Disc levels: There is a small residual fluid collection in the dorsal epidural space of the T6-T7 level (series 20, 20 image) measuring 4 x 6 mm. This could represent spinal residual blood products. Note that sterility of this collection is difficult to assess in the absence of IV contrast. Additional T2 hypointense material is also present in the dorsal epidural space spanning the T6-T9 levels, compatible with a residual blood products. MRI LUMBAR SPINE: Segmentation:  Standard. Alignment:  Physiologic. Vertebrae:  No fracture, evidence of discitis, or bone lesion. Conus medullaris and cauda equina: Conus extends to the L1-L2 disc space level. Conus and cauda equina appear normal. Paraspinal and other soft tissues: Redemonstrated nonspecific calcification and soft tissue in the retroperitoneum on the left. See same day CT abdomen and pelvis for additional findings Disc levels: No evidence of high-grade spinal canal or neural foraminal stenosis. IMPRESSION: MRI HEAD: 1.  Negative for an acute infarct. 2. Small area of susceptibility artifact in the left insular region is nonspecific and may represent sequela of chronic hemorrhage. Recommend short term follow up head CT to exclude the possibility of subarachnoid hemorrhage. MRI CERVICAL AND THORACIC SPINE: 1. Interval evacuation of the previously seen dorsal epidural hematoma in the cervical and thoracic spine. No evidence of residual high grade spinal canal stenosis. There are residual blood products spanning the T6-T9 level. 2. New T2 hyperintense cord signal abnormality spanning involving nearly the entire cervical and thoracic spinal  cord, which is concerning for spinal cord infarct and/or edema. DWI sequences of the cervical and thoracic spine could be obtained for further characterization. 3. Nonspecific fluid along the laminectomy site that likely communicates with the skin surface. Correlate for clinical signs of infection. MRI LUMBAR SPINE: No acute fracture or traumatic malalignment of the lumbar spine. Findings were discussed with Dr. Elvina Sidle on 08/10/23 at 4:01 PM. Electronically Signed   By: Lorenza Cambridge M.D.   On: 08/10/2023 16:02   MR THORACIC SPINE WO CONTRAST Result Date: 08/10/2023 CLINICAL DATA:  Trauma EXAM: MRI HEAD WITHOUT CONTRAST MRI CERVICAL SPINE WITHOUT CONTRAST MRI THORACIC SPINE WITHOUT CONTRAST MRI LUMBAR SPINE WITHOUT CONTRAST TECHNIQUE: Multiplanar, multiecho pulse sequences of the brain and surrounding structures, and cervical and thoracic spine and lumbar spine were obtained without intravenous contrast. COMPARISON:  Cervical spine MRI 08/01/2023, thoracic spine MRI 08/01/2023 FINDINGS: MRI HEAD FINDINGS Brain: Negative for an acute infarct. No hemorrhage. No hydrocephalus. No extra-axial fluid collection. No mass effect. No mass lesion. Cavum septum pellucidum et vergae. Background of mild chronic microvascular ischemic change. Small area of susceptibility artifact in the left insular region (series 10, image 34) is nonspecific and may represent sequela of chronic hemorrhage. Recommend short term follow up head CT to exclude the possibility of subarachnoid hemorrhage. Vascular: Normal flow voids. Skull and upper cervical spine: See below for  cervical spine findings. Sinuses/Orbits: Moderate-to-large bilateral mastoid effusions. Layering secretions in the oropharynx. Mild mucosal thickening in the bilateral ethmoid sinuses. Orbits are unremarkable. Other: None. MRI CERVICAL SPINE FINDINGS Alignment: Physiologic. Vertebrae: No fracture, evidence of discitis, or bone lesion. There are postsurgical changes from  posterior decompression at C3-C6. Cord: Compared to prior exam there is new T2 hyperintense cord signal abnormality spanning involving nearly the entire cervical and thoracic spinal cord. Posterior Fossa, vertebral arteries, paraspinal tissues: There is nonspecific fluid along the laminectomy site that likely communicates with the skin surface (series 12, image 24-26). Endotracheal tube in place. Disc levels: There is a no evidence of high grade spinal canal stenosis. There has been interval evacuation of the previously seen dorsal epidural hematoma MRI THORACIC SPINE FINDINGS Alignment:  Physiologic. Vertebrae: No fracture, evidence of discitis, or bone lesion. Cord: There is T2 hyperintense signal abnormality spanning the entire thoracic spinal cord Paraspinal and other soft tissues: There is mild spinal canal narrowing of the T1-T2 level secondary to mass effect on the dorsal aspect of the thecal sac from the fluid collection at the laminectomy site. Disc levels: There is a small residual fluid collection in the dorsal epidural space of the T6-T7 level (series 20, 20 image) measuring 4 x 6 mm. This could represent spinal residual blood products. Note that sterility of this collection is difficult to assess in the absence of IV contrast. Additional T2 hypointense material is also present in the dorsal epidural space spanning the T6-T9 levels, compatible with a residual blood products. MRI LUMBAR SPINE: Segmentation:  Standard. Alignment:  Physiologic. Vertebrae:  No fracture, evidence of discitis, or bone lesion. Conus medullaris and cauda equina: Conus extends to the L1-L2 disc space level. Conus and cauda equina appear normal. Paraspinal and other soft tissues: Redemonstrated nonspecific calcification and soft tissue in the retroperitoneum on the left. See same day CT abdomen and pelvis for additional findings Disc levels: No evidence of high-grade spinal canal or neural foraminal stenosis. IMPRESSION: MRI HEAD:  1.  Negative for an acute infarct. 2. Small area of susceptibility artifact in the left insular region is nonspecific and may represent sequela of chronic hemorrhage. Recommend short term follow up head CT to exclude the possibility of subarachnoid hemorrhage. MRI CERVICAL AND THORACIC SPINE: 1. Interval evacuation of the previously seen dorsal epidural hematoma in the cervical and thoracic spine. No evidence of residual high grade spinal canal stenosis. There are residual blood products spanning the T6-T9 level. 2. New T2 hyperintense cord signal abnormality spanning involving nearly the entire cervical and thoracic spinal cord, which is concerning for spinal cord infarct and/or edema. DWI sequences of the cervical and thoracic spine could be obtained for further characterization. 3. Nonspecific fluid along the laminectomy site that likely communicates with the skin surface. Correlate for clinical signs of infection. MRI LUMBAR SPINE: No acute fracture or traumatic malalignment of the lumbar spine. Findings were discussed with Dr. Elvina Sidle on 08/10/23 at 4:01 PM. Electronically Signed   By: Lorenza Cambridge M.D.   On: 08/10/2023 16:02   MR LUMBAR SPINE WO CONTRAST Result Date: 08/10/2023 CLINICAL DATA:  Trauma EXAM: MRI HEAD WITHOUT CONTRAST MRI CERVICAL SPINE WITHOUT CONTRAST MRI THORACIC SPINE WITHOUT CONTRAST MRI LUMBAR SPINE WITHOUT CONTRAST TECHNIQUE: Multiplanar, multiecho pulse sequences of the brain and surrounding structures, and cervical and thoracic spine and lumbar spine were obtained without intravenous contrast. COMPARISON:  Cervical spine MRI 08/01/2023, thoracic spine MRI 08/01/2023 FINDINGS: MRI HEAD FINDINGS Brain: Negative for an acute  infarct. No hemorrhage. No hydrocephalus. No extra-axial fluid collection. No mass effect. No mass lesion. Cavum septum pellucidum et vergae. Background of mild chronic microvascular ischemic change. Small area of susceptibility artifact in the left insular region  (series 10, image 34) is nonspecific and may represent sequela of chronic hemorrhage. Recommend short term follow up head CT to exclude the possibility of subarachnoid hemorrhage. Vascular: Normal flow voids. Skull and upper cervical spine: See below for cervical spine findings. Sinuses/Orbits: Moderate-to-large bilateral mastoid effusions. Layering secretions in the oropharynx. Mild mucosal thickening in the bilateral ethmoid sinuses. Orbits are unremarkable. Other: None. MRI CERVICAL SPINE FINDINGS Alignment: Physiologic. Vertebrae: No fracture, evidence of discitis, or bone lesion. There are postsurgical changes from posterior decompression at C3-C6. Cord: Compared to prior exam there is new T2 hyperintense cord signal abnormality spanning involving nearly the entire cervical and thoracic spinal cord. Posterior Fossa, vertebral arteries, paraspinal tissues: There is nonspecific fluid along the laminectomy site that likely communicates with the skin surface (series 12, image 24-26). Endotracheal tube in place. Disc levels: There is a no evidence of high grade spinal canal stenosis. There has been interval evacuation of the previously seen dorsal epidural hematoma MRI THORACIC SPINE FINDINGS Alignment:  Physiologic. Vertebrae: No fracture, evidence of discitis, or bone lesion. Cord: There is T2 hyperintense signal abnormality spanning the entire thoracic spinal cord Paraspinal and other soft tissues: There is mild spinal canal narrowing of the T1-T2 level secondary to mass effect on the dorsal aspect of the thecal sac from the fluid collection at the laminectomy site. Disc levels: There is a small residual fluid collection in the dorsal epidural space of the T6-T7 level (series 20, 20 image) measuring 4 x 6 mm. This could represent spinal residual blood products. Note that sterility of this collection is difficult to assess in the absence of IV contrast. Additional T2 hypointense material is also present in the  dorsal epidural space spanning the T6-T9 levels, compatible with a residual blood products. MRI LUMBAR SPINE: Segmentation:  Standard. Alignment:  Physiologic. Vertebrae:  No fracture, evidence of discitis, or bone lesion. Conus medullaris and cauda equina: Conus extends to the L1-L2 disc space level. Conus and cauda equina appear normal. Paraspinal and other soft tissues: Redemonstrated nonspecific calcification and soft tissue in the retroperitoneum on the left. See same day CT abdomen and pelvis for additional findings Disc levels: No evidence of high-grade spinal canal or neural foraminal stenosis. IMPRESSION: MRI HEAD: 1.  Negative for an acute infarct. 2. Small area of susceptibility artifact in the left insular region is nonspecific and may represent sequela of chronic hemorrhage. Recommend short term follow up head CT to exclude the possibility of subarachnoid hemorrhage. MRI CERVICAL AND THORACIC SPINE: 1. Interval evacuation of the previously seen dorsal epidural hematoma in the cervical and thoracic spine. No evidence of residual high grade spinal canal stenosis. There are residual blood products spanning the T6-T9 level. 2. New T2 hyperintense cord signal abnormality spanning involving nearly the entire cervical and thoracic spinal cord, which is concerning for spinal cord infarct and/or edema. DWI sequences of the cervical and thoracic spine could be obtained for further characterization. 3. Nonspecific fluid along the laminectomy site that likely communicates with the skin surface. Correlate for clinical signs of infection. MRI LUMBAR SPINE: No acute fracture or traumatic malalignment of the lumbar spine. Findings were discussed with Dr. Elvina Sidle on 08/10/23 at 4:01 PM. Electronically Signed   By: Lorenza Cambridge M.D.   On: 08/10/2023 16:02  CT HEAD WO CONTRAST ( ) Result Date: 08/10/2023 CLINICAL DATA:  Acute cervical myelopathy. Neuro deficit with stroke suspected EXAM: CT HEAD WITHOUT CONTRAST CT  CERVICAL SPINE WITHOUT CONTRAST TECHNIQUE: Multidetector CT imaging of the head and cervical spine was performed following the standard protocol without intravenous contrast. Multiplanar CT image reconstructions of the cervical spine were also generated. RADIATION DOSE REDUCTION: This exam was performed according to the departmental dose-optimization program which includes automated exposure control, adjustment of the mA and/or kV according to patient size and/or use of iterative reconstruction technique. COMPARISON:  08/01/2023 FINDINGS: CT HEAD FINDINGS Brain: No evidence of acute infarction, hemorrhage, hydrocephalus, extra-axial collection or mass lesion/mass effect. Vascular: No hyperdense vessel or unexpected calcification. Skull: Normal. Negative for fracture or focal lesion. Sinuses/Orbits: No acute finding CT CERVICAL SPINE FINDINGS Alignment: Normal. Skull base and vertebrae: Interval posterior decompression with removal of laminoplasty at C2-3 to C6-7. No evidence of fracture or erosion. Soft tissues and spinal canal: No visible remaining epidural hemorrhage. Expected postoperative soft tissue swelling in the operative region. Disc levels:  Generalized degenerative endplate and facet spurring. Upper chest: Clear apical lungs IMPRESSION: Head CT: Stable and negative. Cervical spine CT: Interval epidural decompression with laminoplasty removal. No unexpected finding. Electronically Signed   By: Tiburcio Pea M.D.   On: 08/10/2023 04:56   CT CERVICAL SPINE WO CONTRAST Result Date: 08/10/2023 CLINICAL DATA:  Acute cervical myelopathy. Neuro deficit with stroke suspected EXAM: CT HEAD WITHOUT CONTRAST CT CERVICAL SPINE WITHOUT CONTRAST TECHNIQUE: Multidetector CT imaging of the head and cervical spine was performed following the standard protocol without intravenous contrast. Multiplanar CT image reconstructions of the cervical spine were also generated. RADIATION DOSE REDUCTION: This exam was performed  according to the departmental dose-optimization program which includes automated exposure control, adjustment of the mA and/or kV according to patient size and/or use of iterative reconstruction technique. COMPARISON:  08/01/2023 FINDINGS: CT HEAD FINDINGS Brain: No evidence of acute infarction, hemorrhage, hydrocephalus, extra-axial collection or mass lesion/mass effect. Vascular: No hyperdense vessel or unexpected calcification. Skull: Normal. Negative for fracture or focal lesion. Sinuses/Orbits: No acute finding CT CERVICAL SPINE FINDINGS Alignment: Normal. Skull base and vertebrae: Interval posterior decompression with removal of laminoplasty at C2-3 to C6-7. No evidence of fracture or erosion. Soft tissues and spinal canal: No visible remaining epidural hemorrhage. Expected postoperative soft tissue swelling in the operative region. Disc levels:  Generalized degenerative endplate and facet spurring. Upper chest: Clear apical lungs IMPRESSION: Head CT: Stable and negative. Cervical spine CT: Interval epidural decompression with laminoplasty removal. No unexpected finding. Electronically Signed   By: Tiburcio Pea M.D.   On: 08/10/2023 04:56

## 2023-08-10 NOTE — Plan of Care (Signed)
  Problem: Education: Goal: Ability to describe self-care measures that may prevent or decrease complications (Diabetes Survival Skills Education) will improve Outcome: Not Progressing   Problem: Coping: Goal: Ability to adjust to condition or change in health will improve Outcome: Not Progressing   Problem: Fluid Volume: Goal: Ability to maintain a balanced intake and output will improve Outcome: Not Progressing   Problem: Health Behavior/Discharge Planning: Goal: Ability to identify and utilize available resources and services will improve Outcome: Not Progressing Goal: Ability to manage health-related needs will improve Outcome: Not Progressing   Problem: Metabolic: Goal: Ability to maintain appropriate glucose levels will improve Outcome: Not Progressing   Problem: Nutritional: Goal: Maintenance of adequate nutrition will improve Outcome: Not Progressing Goal: Progress toward achieving an optimal weight will improve Outcome: Not Progressing   Problem: Skin Integrity: Goal: Risk for impaired skin integrity will decrease Outcome: Not Progressing   Problem: Tissue Perfusion: Goal: Adequacy of tissue perfusion will improve Outcome: Not Progressing   Problem: Education: Goal: Knowledge of General Education information will improve Description: Including pain rating scale, medication(s)/side effects and non-pharmacologic comfort measures Outcome: Not Progressing   Problem: Health Behavior/Discharge Planning: Goal: Ability to manage health-related needs will improve Outcome: Not Progressing   Problem: Clinical Measurements: Goal: Ability to maintain clinical measurements within normal limits will improve Outcome: Not Progressing Goal: Will remain free from infection Outcome: Not Progressing Goal: Diagnostic test results will improve Outcome: Not Progressing Goal: Respiratory complications will improve Outcome: Not Progressing Goal: Cardiovascular complication will  be avoided Outcome: Not Progressing   Problem: Activity: Goal: Risk for activity intolerance will decrease Outcome: Not Progressing   Problem: Nutrition: Goal: Adequate nutrition will be maintained Outcome: Not Progressing   Problem: Coping: Goal: Level of anxiety will decrease Outcome: Not Progressing   Problem: Elimination: Goal: Will not experience complications related to bowel motility Outcome: Not Progressing Goal: Will not experience complications related to urinary retention Outcome: Not Progressing   Problem: Pain Managment: Goal: General experience of comfort will improve and/or be controlled Outcome: Not Progressing   Problem: Safety: Goal: Ability to remain free from injury will improve Outcome: Not Progressing   Problem: Skin Integrity: Goal: Risk for impaired skin integrity will decrease Outcome: Not Progressing   Problem: Education: Goal: Ability to verbalize activity precautions or restrictions will improve Outcome: Not Progressing Goal: Knowledge of the prescribed therapeutic regimen will improve Outcome: Not Progressing Goal: Understanding of discharge needs will improve Outcome: Not Progressing   Problem: Activity: Goal: Ability to avoid complications of mobility impairment will improve Outcome: Not Progressing Goal: Ability to tolerate increased activity will improve Outcome: Not Progressing Goal: Will remain free from falls Outcome: Not Progressing   Problem: Bowel/Gastric: Goal: Gastrointestinal status for postoperative course will improve Outcome: Not Progressing   Problem: Clinical Measurements: Goal: Ability to maintain clinical measurements within normal limits will improve Outcome: Not Progressing Goal: Postoperative complications will be avoided or minimized Outcome: Not Progressing Goal: Diagnostic test results will improve Outcome: Not Progressing   Problem: Pain Management: Goal: Pain level will decrease Outcome: Not  Progressing   Problem: Skin Integrity: Goal: Will show signs of wound healing Outcome: Not Progressing   Problem: Health Behavior/Discharge Planning: Goal: Identification of resources available to assist in meeting health care needs will improve Outcome: Not Progressing   Problem: Bladder/Genitourinary: Goal: Urinary functional status for postoperative course will improve Outcome: Not Progressing

## 2023-08-10 NOTE — Consult Note (Signed)
Pharmacy Antibiotic Note  [name] is a 68 y.o. female admitted on 07/28/2023 with C2 dome laminectomy and C3-6 laminoplasty on 1/21 with complicated post-op course with return to OR 1/25 for cervical / thoracic laminectomy and epidural hematoma evacuation, encephalopathy, respiratory failure requiring mechanical ventilation.  Pharmacy has been consulted for vancomycin dosing.  Plan:  Vancomycin 1.5 g IV x 1 dose followed by dosing per levels given AKI --Check a level in AM, daily Scr  Height: 5\' 7"  (170.2 cm) Weight: 60.2 kg (132 lb 11.5 oz) IBW/kg (Calculated) : 61.6  Temp (24hrs), Avg:99.1 F (37.3 C), Min:97.5 F (36.4 C), Max:100 F (37.8 C)  Recent Labs  Lab 08/06/23 0348 08/07/23 0346 08/07/23 1150 08/08/23 0405 08/09/23 0351 08/09/23 0637 08/09/23 0750 08/10/23 0333  WBC 10.9* 12.4*  --  13.0*  --  10.6*  --  24.4*  CREATININE 0.83 0.88 1.08* 0.91 0.75  --   --  1.56*  LATICACIDVEN  --   --   --   --   --   --  0.8  --     Estimated Creatinine Clearance: 34.2 mL/min (A) (by C-G formula based on SCr of 1.56 mg/dL (H)).    Allergies  Allergen Reactions   Amoxicillin-Pot Clavulanate Itching and Anaphylaxis   Ampicillin Rash    Antimicrobials this admission: Cefepime 1/24 >> 1/27 Metronidazole 1/24 >> 1/27, 1/31 >> 2/2 Vancomycin 1/24 >> 1/26, 2/3 >> Doxycycline 1/27 >> 2/3 Ceftriaxone 1/31 >> 2/2 Ceftazidime 2/3 >>   Dose adjustments this admission: N/A  Microbiology results: 1/25 MRSA PCR: (+) 1/31 BAL: Candida glabrata 2/2 BAL: pending 2/3 BCx: pending  Thank you for allowing pharmacy to be a part of this patient's care.  Tressie Ellis 08/10/2023 1:58 PM

## 2023-08-10 NOTE — Progress Notes (Signed)
Pt's arms and legs flaccid during 0400 assessment. No longer able to elicit a flicker of muscle from any extremity. Pt does grimace during oral care, but not tracking or acknowledging this nurse during assessment, despite sedation medications being held for about 40 minutes prior to assessment. Provider notified.

## 2023-08-10 NOTE — Progress Notes (Signed)
PHARMACY - TOTAL PARENTERAL NUTRITION CONSULT NOTE   Indication: Unable to place NG tube for enteral nutrition due to severe esophageal stricture  Patient Measurements: Height: 5\' 7"  (170.2 cm) Weight: 60.2 kg (132 lb 11.5 oz) IBW/kg (Calculated) : 61.6 TPN AdjBW (KG): 58.9 Body mass index is 20.79 kg/m.  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   Glucose / Insulin:  --Last Hgb A1c was 5.9% on 04/13/23, has hx of diabetes --BG last 24h: 96 - 225 --SSI last 24h: 15u Electrolytes: Hypermagnesemia Renal: Scr indicates AKI, Scr 1.56 (BL Scr < 1) Hepatic: No evidence of hepatic insufficiency Intake / Output: +7.8L MIVF: Isotonic bicarbonate gtt at 75 cc/hr GI Imaging: None recent GI Surgeries / Procedures: None recent  Central access: 08/04/23 TPN start date: 08/04/23   RD Assessment: Estimated Needs Total Energy Estimated Needs: 1800-2100kcal/day Total Protein Estimated Needs: 95-110g/day Total Fluid Estimated Needs: 1.8-2.1L/day  Current Nutrition:   NPO, intubated. Does not have NG / OG tube. Multiple prior attempts to place have failed. Tentative plan is for PEG tube  Plan:  --Initiate custom TPN 108 g protein, 280 g dextrose, hold lipids --Electrolytes in TPN: Na 37mEq/L, K 24mEq/L, Ca 109mEq/L, Mg 69mEq/L, and Phos 68mmol/L. Cl:Ac 1:2 --Continue rSSI q4h + Novolog 3u q4h + Semglee 5u daily and adjust as indicated. Monitor closely in case TPN is interrupted --Continue isotonic bicarbonate gtt at 75 cc/hr for now. Monitor Co2 / fluid status --Re-check electrolytes in AM  Tressie Ellis 08/10/2023,1:13 PM

## 2023-08-10 NOTE — Progress Notes (Signed)
Brief Interventional Radiology Note:  IR consulted for possible G-tube placement. CT Abdomen/Pelvis ordered to assess feasibility. After reviewing images with IR attending, there is concern that the stomach is shielded by an elongated left lobe of the liver which crosses midline to the far left abdomen. Discussed with the team that unfortunately, we do not feel that this patient is a candidate for percutaneous gastrostomy tube placement and would recommend evaluation by General Surgery if the patient will likely require long-term enteral access.   Electronically Signed: Jama Flavors, PA-C 08/10/2023, 2:25 PM

## 2023-08-10 NOTE — Progress Notes (Signed)
NAME:  [name], MRN:  [id number], DOB:  1955-12-10, LOS: 13 ADMISSION DATE:  07/28/2023, CHIEF COMPLAINT:  Cervical Myelopathy   History of Present Illness:   [name] is a 68 year old old female with PMHx of hypothyroidism, insulin-dependent DM, CKD stage III, dysphagia with esophageal strictures, facial cellulitis, GERD, neuropathy, arthritis, blood dyscrasia, and recent traumatic cervical central cord injury who presented to the hospital for elective cervical spine surgery.   Per patient's chart, patient was seen by neurosurgery on 07/28/2023 for follow-up after suffering a fall 3 months ago in which she sustained a cervical spinal cord injury.  Per patient, they deferred emergent surgical depression and wanted to let her have time to recover and work with physical therapy.  She continued to have hand weakness as well as lower extremity weakness and ambulatory issues.  Given the ongoing compression of C3-C6, laminoplasty and C2 dome laminectomy was indicated.     Hospital course: Patient underwent C3-6 laminoplasty and C2 dome and laminectomy surgery on 07/29/2023 with drain placement.   Pertinent Labs/Diagnostics Findings: Na+/ K+: 141/4.4 Glucose: 136 BUN/Cr.47/1.42 WBC: 5.8 K/L  Hgb/Hct: 7.4/22.4  Pertinent  Medical History  Hypothyroidism, insulin-dependent DM, CKD stage III, dysphagia with esophageal strictures, facial cellulitis, GERD, neuropathy, arthritis, blood dyscrasia, and recent traumatic cervical central cord injury  Significant Hospital Events: Including procedures, antibiotic start and stop dates in addition to other pertinent events   01/21: Patient underwent posterior C3-6 Cervical Laminoplasty with C2 inferior dome Laminectomy 01/22: POD #1. Pt. stable with no acute changes.  Working with PT and OT 01/23: POD #2.  Pt. noted to be very lethargic and difficult to arouse 01/24: POD #3. Pt. developed hypoxia tachycardia and shortness of breath.  Hospitalist  consulted CT chest obtained and showed possible pneumonia.  Started on IV antibiotics and DuoNebs. 01/25: POD #4. Pt. with new concerns of not moving the left arm.  CT head and cervical spine obtained with no new concerns.  Follow-up MRI however demonstrated epidural hematoma extending from the cervical spine to the lower thoracic spine with acute compression and T2 signal.  Taken emergently to the OR for surgical decompression 01/26: Pt. transferred to the ICU s/p emergent cervical decompression and evacuation of epidural hematoma.  Remained intubated. PCCM consulted 08/03/23- s/p surgery overnight, alert to self but drowsy with mild AKI and metabolic acidosis.  Lethargy noted have dcd cefepime and started doxycycline instead.  08/04/23- patient more alert able to wiggle toes to verbal.  Remains critically ill.  08/05/23- patient appears to have hypoactive delerium with waxing and waning sensorium.  At times she asks for things other times she's poorly responsive.  Her oxygenation has improved with nasal canula 5L/min.  S/p heme/onc evaluation today 08/06/23- patient seen at bedside, she was able to whisper few words tome.  I discussed PO intake with speech pathologist and SLP with barium was ordered however patient unable to complete this study. She is still very weak physically barely able to wiggle toes.  H/h <7 s/p prbc.  Renal function is improved.  08/07/23- patient had worsening hypoxemia with CXR showing worsening complete left opacification. I discussed this with neurosurgery and they stated her cervical spine did not have any new hardware so intubation should not be complicated. I met with PA Manning Charity at bedside  to review care plan. We discussed these findings with husband and proceeded with intubation and bronchoscopy.  The entire left lung appeared completely full of mucus impaction in both upper and lower  lobes during bronchoscopy.  CXR post bronchoscopy is in process.  08/08/23- patient is  extubated and is able to speak few words with family.  She moved her feet and right hand today.  I met with husband and son at bedside reviewed imaging and bloodwork. 08/09/23- patient had severe event overnight with hypoxemia , CXR This am with complete opacification of left lung again.  She went into respiratory distress overnight with severe hypoxemia.  She was emergently intubated overnight. WE notified familly and discussed events and imaging findings. She is s/p bronch with improved CXR post procedure.  08/10/23 - remains weak and minimally responsive, flaccid upper and lower extremities  Interim History / Subjective:  Non-responsive, sedated  Objective   Blood pressure (!) 215/83, pulse (!) 115, temperature 97.7 F (36.5 C), resp. rate (!) 29, height 5\' 7"  (1.702 m), weight 60.2 kg, SpO2 100%.    Vent Mode: PRVC FiO2 (%):  [30 %-50 %] 30 % Set Rate:  [16 bmp] 16 bmp Vt Set:  [450 mL] 450 mL PEEP:  [8 cmH20] 8 cmH20 Plateau Pressure:  [19 cmH20-22 cmH20] 20 cmH20   Intake/Output Summary (Last 24 hours) at 08/10/2023 1322 Last data filed at 08/10/2023 1319 Gross per 24 hour  Intake 3651.12 ml  Output 650 ml  Net 3001.12 ml   Filed Weights   08/08/23 0320 08/09/23 0400 08/10/23 0500  Weight: 57.8 kg 58.9 kg 60.2 kg    Examination: Physical Exam Constitutional:      General: She is not in acute distress.    Appearance: She is ill-appearing.  Cardiovascular:     Rate and Rhythm: Normal rate and regular rhythm.     Pulses: Normal pulses.     Heart sounds: Normal heart sounds.  Pulmonary:     Comments: Ventilated breath sounds bilaterally Abdominal:     Palpations: Abdomen is soft.  Neurological:     Mental Status: She is disoriented.     Motor: Weakness present.     Comments: Non-responsive, flaccid      Assessment & Plan:   #Acute Hypoxic Respiratory Failure #Aspiration Pneumonia #Toxic Metabolic Encephalopathy #Cervical Myelopathy s/p C2 dome laminectomy, C3-C6  laminoplasty; complicated by epidural hematoma s/p surgical decompression #Blood loss anemia #AKI on CKD stage III #T2DM #Hypothyroidism  Neuro - C2 laminectomy, C3-C6 laminoplasty, complicated by epidural hematoma requiring surgical decompression. Course complicated by metabolic encephalopathy with concern for lower and upper extremity weakness. CT head and neck without acute bleed, but given persistent symptoms and history of epidural hematoma we will proceed with MRI of the brain and spin to rule that out. We will also discontinue sedation post MRI for WUA. On Fentanyl on Propofol for analgosedation at the moment with goal RASS of -1.  CV - was on phenylephrine for sedation related hypotension, will switch to nor-epinephrine with goal MAP > 65 mmHg  Pulm - intubated for respiratory failure secondary to encephalopthy complicated by aspiration pneumonia and white out of the left lung. Now s/p flexible bronchoscopy for therapeutic aspiration of secretions, with improved aeration of the left lung. On mechanical ventilation, with plan for SBT with improvement in mental status and following treatment for aspiration pneumonia. Continue hypertonic saline and aggressive pulmonary toilet.  Renal - AKI on CKD, with non-anion gap metabolic acidosis. Will initiate sodium bicarbonate for acid base support and volume repletion. Monitoring electrolytes and kidney function closely, avoiding nephrotoxins as able. Metabolic acidosis could be secondary to TPN.  GI - initiated on TPN given inability  to pass an oral feeding tube due to esophageal strictures. Patient will require long term enteral access and we will obtain a CT scan of the abdomen to assess for feasibility of IR guided PEG tube. Continue PPI for prophylaxis.  Endo - on basal bolus regimen of insulin with glargine 5 units daily, aspart 3 units every 4 hours, and sliding scale.  Hem/Onc - enoxaparin for DVT prophylaxis, will switch to heparin 5000 twice  daily given AKI and concern for bleed. Will increase heparin to TID should MRI return re-assuring. H/H and platelet count stable. Will initiate SCD's.  ID - aspiration pneumonia secondary to encephalopathy and recent surgery with inability to protect the airway. Respiratory cultures previously grew Candida Glabrata, which is likely a colonizer but with rising white count and initiation of TPN, I am concerned for fungemia and will obtain blood cultures. Will also broaden IV antibiotics to cover for HAP with Ceftazidime (avoiding Cefepime due to encephalopathy) and Vancomycin.   Best Practice (right click and "Reselect all SmartList Selections" daily)   Diet/type: TPN DVT prophylaxis prophylactic heparin  Pressure ulcer(s): N/A GI prophylaxis: PPI Lines: Central line and yes and it is still needed Foley:  Yes, and it is still needed Code Status:  full code Last date of multidisciplinary goals of care discussion [08/10/2023]  Labs   CBC: Recent Labs  Lab 08/06/23 0348 08/06/23 1343 08/07/23 0346 08/07/23 1756 08/08/23 0405 08/09/23 0637 08/10/23 0333  WBC 10.9*  --  12.4*  --  13.0* 10.6* 24.4*  HGB 6.8*   < > 8.3* 8.3* 9.0* 7.9* 8.9*  HCT 20.1*   < > 25.3* 27.1* 26.4* 25.1* 27.0*  MCV 90.1  --  89.7  --  94.6 94.4 97.1  PLT 224  --  194  --  201 173 188   < > = values in this interval not displayed.    Basic Metabolic Panel: Recent Labs  Lab 08/06/23 0348 08/07/23 0346 08/07/23 1150 08/08/23 0405 08/08/23 0641 08/09/23 0351 08/10/23 0333  NA 154* 153* 149* 146*  --  146* 142  K 3.8 4.5 5.2* 4.3  --  4.0 4.6  CL 128* 127* 126* 125*  --  125* 119*  CO2 19* 16* 14* 14*  --  12* 14*  GLUCOSE 241* 214* 281* 232*  --  128* 174*  BUN 62* 71* 78* 70*  --  68* 80*  CREATININE 0.83 0.88 1.08* 0.91  --  0.75 1.56*  CALCIUM 9.1 8.9 9.1 8.7*  --  8.7* 8.2*  MG 2.4 2.1  --   --  1.9 1.6* 2.7*  PHOS 2.2* 3.3  --  2.6  --  2.5 4.0   GFR: Estimated Creatinine Clearance: 34.2 mL/min  (A) (by C-G formula based on SCr of 1.56 mg/dL (H)). Recent Labs  Lab 08/07/23 0346 08/08/23 0405 08/09/23 0637 08/09/23 0750 08/10/23 0333  WBC 12.4* 13.0* 10.6*  --  24.4*  LATICACIDVEN  --   --   --  0.8  --     Liver Function Tests: Recent Labs  Lab 08/04/23 0401 08/05/23 0403 08/06/23 0348 08/07/23 0346 08/08/23 0405 08/09/23 0351 08/10/23 0333  AST 21 19 20   --   --   --  25  ALT 15 15 16   --   --   --  14  ALKPHOS 51 46 49  --   --   --  59  BILITOT 1.5* 1.0 0.7  --   --   --  0.6  PROT 6.0* 5.7* 6.1*  --   --   --  6.0*  ALBUMIN 2.8* 2.9* 3.2* 3.0* 3.4* 3.1* 3.2*   No results for input(s): "LIPASE", "AMYLASE" in the last 168 hours. No results for input(s): "AMMONIA" in the last 168 hours.  ABG    Component Value Date/Time   PHART 7.23 (L) 08/09/2023 0637   PCO2ART 31 (L) 08/09/2023 0637   PO2ART 88 08/09/2023 0637   HCO3 13.0 (L) 08/09/2023 0637   ACIDBASEDEF 13.3 (H) 08/09/2023 0637   O2SAT 98.5 08/09/2023 0637     Coagulation Profile: Recent Labs  Lab 08/05/23 1811  INR 1.2    Cardiac Enzymes: No results for input(s): "CKTOTAL", "CKMB", "CKMBINDEX", "TROPONINI" in the last 168 hours.  HbA1C: Hemoglobin A1C  Date/Time Value Ref Range Status  02/20/2013 04:02 AM 10.3 (H) 4.2 - 6.3 % Final    Comment:    The American Diabetes Association recommends that a primary goal of therapy should be <7% and that physicians should reevaluate the treatment regimen in patients with HbA1c values consistently >8%.    Hgb A1c MFr Bld  Date/Time Value Ref Range Status  04/13/2023 11:34 AM 5.9 (H) 4.8 - 5.6 % Final    Comment:    (NOTE) Pre diabetes:          5.7%-6.4%  Diabetes:              >6.4%  Glycemic control for   <7.0% adults with diabetes   08/11/2021 08:39 PM 7.2 (H) 4.8 - 5.6 % Final    Comment:    (NOTE) Pre diabetes:          5.7%-6.4%  Diabetes:              >6.4%  Glycemic control for   <7.0% adults with diabetes      CBG: Recent Labs  Lab 08/09/23 1919 08/10/23 0001 08/10/23 0330 08/10/23 0731 08/10/23 0827  GLUCAP 125* 153* 155* 96 132*    Review of Systems:   Unable to obtain  Past Medical History:  She,  has a past medical history of Acute incomplete quadriplegia (HCC), Acute respiratory failure with hypoxia (HCC), AKI (acute kidney injury) (HCC), Arthritis, Blood dyscrasia, Central cord syndrome (HCC), Dental caries, Diabetes mellitus without complication (HCC), Diverticulosis, Dysphagia, Esophageal stricture, Facial cellulitis, GERD (gastroesophageal reflux disease), Hypothyroidism, Neuropathy, Odynophagia, Pelvic fracture (HCC), Renal insufficiency, Spinal cord compression, post-traumatic (HCC), Spinal cord injury, cervical region (HCC), and Stage 3a chronic kidney disease (CKD) (HCC).   Surgical History:   Past Surgical History:  Procedure Laterality Date   ABDOMINAL HYSTERECTOMY     BILATERAL SALPINGECTOMY Bilateral 03/16/2017   Procedure: BILATERAL SALPINGECTOMY;  Surgeon: Schermerhorn, Ihor Austin, MD;  Location: [facility] ORS;  Service: Gynecology;  Laterality: Bilateral;   BIOPSY  05/03/2023   Procedure: BIOPSY;  Surgeon: Benancio Deeds, MD;  Location: [facility] ENDOSCOPY;  Service: Gastroenterology;;   COLONOSCOPY WITH PROPOFOL N/A 08/18/2018   Procedure: COLONOSCOPY WITH PROPOFOL;  Surgeon: Scot Jun, MD;  Location: [facility] ENDOSCOPY;  Service: Endoscopy;  Laterality: N/A;   CYSTOCELE REPAIR N/A 03/16/2017   Procedure: ANTERIOR REPAIR (CYSTOCELE);  Surgeon: Schermerhorn, Ihor Austin, MD;  Location: [facility] ORS;  Service: Gynecology;  Laterality: N/A;   ESOPHAGOGASTRODUODENOSCOPY (EGD) WITH PROPOFOL N/A 08/18/2018   Procedure: ESOPHAGOGASTRODUODENOSCOPY (EGD) WITH PROPOFOL;  Surgeon: Scot Jun, MD;  Location: [facility] ENDOSCOPY;  Service: Endoscopy;  Laterality: N/A;   ESOPHAGOGASTRODUODENOSCOPY (EGD) WITH PROPOFOL Left 05/01/2023   Procedure: ESOPHAGOGASTRODUODENOSCOPY (EGD)  WITH  PROPOFOL;  Surgeon: Benancio Deeds, MD;  Location: [facility] ENDOSCOPY;  Service: Gastroenterology;  Laterality: Left;   ESOPHAGOGASTRODUODENOSCOPY (EGD) WITH PROPOFOL N/A 05/03/2023   Procedure: ESOPHAGOGASTRODUODENOSCOPY (EGD) WITH PROPOFOL;  Surgeon: Benancio Deeds, MD;  Location: [facility] ENDOSCOPY;  Service: Gastroenterology;  Laterality: N/A;   POSTERIOR CERVICAL LAMINECTOMY N/A 07/28/2023   Procedure: C2 DOME LAMINECTOMY;  Surgeon: [name], MD;  Location: [facility] ORS;  Service: Neurosurgery;  Laterality: N/A;   POSTERIOR CERVICAL LAMINECTOMY N/A 08/01/2023   Procedure: POSTERIOR CERVICAL LAMINECTOMY;  Surgeon: [name], MD;  Location: [facility] ORS;  Service: Neurosurgery;  Laterality: N/A;   TEAR DUCT PROBING Right    TUBAL LIGATION     VAGINAL HYSTERECTOMY N/A 03/16/2017   Procedure: HYSTERECTOMY VAGINAL;  Surgeon: Schermerhorn, Ihor Austin, MD;  Location: [facility] ORS;  Service: Gynecology;  Laterality: N/A;   WISDOM TOOTH EXTRACTION       Social History:   reports that she has never smoked. She has never used smokeless tobacco. She reports that she does not drink alcohol and does not use drugs.   Family History:  Her family history includes Breast cancer in her maternal aunt; Diabetes in her father; Hypertension in her father and mother.   Allergies Allergies  Allergen Reactions   Amoxicillin-Pot Clavulanate Itching and Anaphylaxis   Ampicillin Rash     Home Medications  Prior to Admission medications   Medication Sig Start Date End Date Taking? Authorizing Provider  acetaminophen (TYLENOL) 650 MG CR tablet Take 650 mg by mouth every 8 (eight) hours as needed for pain.   Yes [provider]  chlorhexidine (HIBICLENS) 4 % external liquid Apply 15 mLs (1 Application total) topically as directed for 30 doses. Use as directed daily for 5 days every other week for 6 weeks. 07/28/23  Yes Joan Flores, PA-C  cyanocobalamin (VITAMIN B12) 1000 MCG tablet Take 1,000 mcg by  mouth daily.   Yes [provider]  cyclobenzaprine (FLEXERIL) 10 MG tablet Take 1 tablet (10 mg total) by mouth at bedtime. 06/15/23  Yes Lovorn, Aundra Millet, MD  diclofenac Sodium (VOLTAREN) 1 % GEL Apply 2 g topically 4 (four) times daily. Patient taking differently: Apply 2 g topically 4 (four) times daily as needed (pain). 05/11/23  Yes Setzer, Lynnell Jude, PA-C  fludrocortisone (FLORINEF) 0.1 MG tablet Take 2 tablets (0.2 mg total) by mouth daily. For orthostatic hypotension 06/15/23  Yes Lovorn, Aundra Millet, MD  gabapentin (NEURONTIN) 100 MG capsule Take 200 mg by mouth 2 (two) times daily.  08/16/15  Yes [provider]  glipiZIDE (GLUCOTROL) 5 MG tablet Take 10 mg by mouth daily before breakfast.   Yes [provider]  JARDIANCE 25 MG TABS tablet Take 25 mg by mouth daily.   Yes [provider]  lactase (LACTAID) 3000 units tablet Take 3,000 Units by mouth daily before breakfast.   Yes [provider]  levothyroxine (SYNTHROID) 88 MCG tablet Take 88 mcg by mouth daily before breakfast.   Yes [provider]  midodrine (PROAMATINE) 10 MG tablet Take 1.5 tablets (15 mg total) by mouth 3 (three) times daily with meals. For orthostatic hypotension 06/15/23  Yes Lovorn, Aundra Millet, MD  Multiple Vitamin (MULTIVITAMIN WITH MINERALS) TABS tablet Take 1 tablet by mouth daily. 04/22/23  Yes Sunnie Nielsen, DO  mupirocin ointment (BACTROBAN) 2 % Place 1 Application into the nose 2 (two) times daily for 60 doses. Use as directed 2 times daily for 5 days every other week for  6 weeks. 07/28/23 08/27/23 Yes Joan Flores, PA-C  pioglitazone (ACTOS) 30 MG tablet Take 30 mg by mouth daily.   Yes [provider]  Psyllium (METAMUCIL 3 IN 1 DAILY FIBER PO) Take 2 capsules by mouth daily.   Yes [provider]  rosuvastatin (CRESTOR) 5 MG tablet Take 5 mg by mouth at bedtime.   Yes [provider]  senna (SENOKOT) 8.6 MG TABS tablet Take 1 tablet (8.6  mg total) by mouth at bedtime. 05/11/23  Yes Setzer, Lynnell Jude, PA-C     Critical care time: 66 minutes    Raechel Chute, MD [facility] 08/10/2023 1:52 PM

## 2023-08-10 NOTE — Progress Notes (Signed)
Pt transported to CT, then to MRI, and back to room without incident.

## 2023-08-10 NOTE — Progress Notes (Signed)
Nutrition Follow Up Note   DOCUMENTATION CODES:   Non-severe (moderate) malnutrition in context of chronic illness  INTERVENTION:   Continue TPN per pharmacy   If G-tube is able to be replaced:  Osmolite 1.5@60ml /hr- Initiate at 2ml/hr and increase by 32ml/hr q 8 hours until goal rate is reached.   ProSource TF 20- Give 60ml daily via tube, each supplement provides 80kcal and 20g of protein.   Free water flushes q4 hours to  Regimen provides 2240kcal/day, 110g/day protein and 1662ml/day of free water.   Daily weights   NUTRITION DIAGNOSIS:   Moderate Malnutrition related to chronic illness as evidenced by mild fat depletion, moderate fat depletion, mild muscle depletion, severe muscle depletion. -ongoing   GOAL:   Provide needs based on ASPEN/SCCM guidelines -not met   MONITOR:   Vent status, Labs, Weight trends, I & O's, Skin, Other (Comment) (TPN)  ASSESSMENT:   68 y/o female with h/o DM, CKD III, thyroid cancer s/p total thyroidectomy 01/2023, GERD, diverticulitis, esophageal stricture s/p dilation 04/2022 who is admitted with cervical central cord injury after a trauma last year now s/p elective posterior cervical laminoplasty C3-6 and C2 inferior dome laminectomy 1/21 complicated by HCAP, AKI and epidural hematoma s/p emergent cervical decompression and evacuation 1/26.  Pt remains sedated and ventilated. Pt remains without enteral access. Pt remains on TPN and is tolerating well. Hyperglycemia and hypernatremia resolved. Triglycerides remain elevated; lipids on hold and propofol discontinued today. Refeed labs stabilized. Spoke with husband, son and daughter about the recommendation for G-tube placement if full scope of care is still desired. Family is agreeable G-tube placement; IR consult is pending. Will plan to transition off TPN once enteral access is gained. Last BM documented 1/30. Per chart, pt is weight stable since admission. Plan is for possible  tracheostomy.   Medications reviewed and include: heparin, insulin, synthroid, protonix, albumin, fortaz, precedex, TPN, levophed, Na Bicarbonate, vancomycin, vasopressin   Labs reviewed: Na 142 wnl, K 4.6 wnl, BUN 80(H), creat 1.56(H), P 4.0 wnl, Mg 2.7(H) Triglycerides- 604(H) Wbc- 24.4(H), Hgb 8.9(L), Hct 27.0(L) Cbgs- 153, 132, 96, 155, 152 x 24 hrs  Patient is currently intubated on ventilator support MV: 8.9 L/min Temp (24hrs), Avg:99.1 F (37.3 C), Min:97.5 F (36.4 C), Max:100 F (37.8 C)  MAP- >47mmHg   UOP-   Nutrition Focused Physical Exam:  Flowsheet Row Most Recent Value  Orbital Region Moderate depletion  Upper Arm Region Moderate depletion  Thoracic and Lumbar Region No depletion  Buccal Region Mild depletion  Temple Region Mild depletion  Clavicle Bone Region Mild depletion  Clavicle and Acromion Bone Region Mild depletion  Scapular Bone Region Moderate depletion  Dorsal Hand Unable to assess  Patellar Region Severe depletion  Anterior Thigh Region Severe depletion  Posterior Calf Region Severe depletion  Edema (RD Assessment) Moderate  Hair Reviewed  Eyes Reviewed  Mouth Reviewed  Skin Reviewed  Nails Reviewed   Diet Order:   Diet Order     None      EDUCATION NEEDS:   Not appropriate for education at this time  Skin:  Skin Assessment: Reviewed RN Assessment (incision back and neck)  Last BM:  1/30- type 1  Height:   Ht Readings from Last 1 Encounters:  07/28/23 5\' 7"  (1.702 m)    Weight:   Wt Readings from Last 1 Encounters:  08/10/23 60.2 kg    Ideal Body Weight:  61.36 kg  BMI:  Body mass index is 20.79 kg/m.  Estimated Nutritional Needs:   Kcal:  1900-2200kcal/day  Protein:  95-110g/day  Fluid:  1.8-2.1L/day  Betsey Holiday MS, RD, LDN If unable to be reached, please send secure chat to "RD inpatient" available from 8:00a-4:00p daily

## 2023-08-10 NOTE — Progress Notes (Signed)
 Transported to MRI and back with no events.

## 2023-08-10 NOTE — TOC Progression Note (Signed)
Transition of Care [facility]) - Progression Note    Patient Details  Name: [name] MRN: [id number] Date of Birth: 29-Aug-1955  Transition of Care [facility]) CM/SW Contact  Margarito Liner, LCSW Phone Number: 08/10/2023, 2:33 PM  Clinical Narrative:   TOC continues to follow progress. Patient remains intubated.  Expected Discharge Plan: Skilled Nursing Facility Barriers to Discharge: SNF Pending bed offer, Insurance Authorization  Expected Discharge Plan and Services   Discharge Planning Services: CM Consult   Living arrangements for the past 2 months: Single Family Home                   DME Agency: NA       HH Arranged: NA           Social Determinants of Health (SDOH) Interventions SDOH Screenings   Food Insecurity: No Food Insecurity (07/28/2023)  Housing: Low Risk  (07/28/2023)  Transportation Needs: No Transportation Needs (07/28/2023)  Utilities: Not At Risk (07/28/2023)  Depression (PHQ2-9): Low Risk  (06/15/2023)  Social Connections: Moderately Integrated (07/28/2023)  Tobacco Use: Low Risk  (07/28/2023)    Readmission Risk Interventions     No data to display

## 2023-08-11 ENCOUNTER — Other Ambulatory Visit: Payer: Self-pay

## 2023-08-11 ENCOUNTER — Encounter
Admission: RE | Disposition: A | Discharge status: Nursing Home (Includes ICF) | Payer: Self-pay | Source: Ambulatory Visit | Attending: Neurosurgery

## 2023-08-11 ENCOUNTER — Inpatient Hospital Stay: Payer: Medicare Other | Admitting: Anesthesiology

## 2023-08-11 ENCOUNTER — Inpatient Hospital Stay: Payer: Medicare Other

## 2023-08-11 DIAGNOSIS — K222 Esophageal obstruction: Secondary | ICD-10-CM | POA: Diagnosis not present

## 2023-08-11 DIAGNOSIS — M4714 Other spondylosis with myelopathy, thoracic region: Secondary | ICD-10-CM

## 2023-08-11 DIAGNOSIS — R1310 Dysphagia, unspecified: Secondary | ICD-10-CM | POA: Diagnosis not present

## 2023-08-11 DIAGNOSIS — J9601 Acute respiratory failure with hypoxia: Secondary | ICD-10-CM | POA: Diagnosis not present

## 2023-08-11 DIAGNOSIS — G959 Disease of spinal cord, unspecified: Secondary | ICD-10-CM | POA: Diagnosis not present

## 2023-08-11 DIAGNOSIS — S14109D Unspecified injury at unspecified level of cervical spinal cord, subsequent encounter: Secondary | ICD-10-CM | POA: Diagnosis not present

## 2023-08-11 DIAGNOSIS — E440 Moderate protein-calorie malnutrition: Secondary | ICD-10-CM | POA: Diagnosis not present

## 2023-08-11 HISTORY — PX: GASTROSTOMY: SHX5249

## 2023-08-11 LAB — CBC
HCT: 22.3 % — ABNORMAL LOW (ref 36.0–46.0)
Hemoglobin: 7.3 g/dL — ABNORMAL LOW (ref 12.0–15.0)
MCH: 30.2 pg (ref 26.0–34.0)
MCHC: 32.7 g/dL (ref 30.0–36.0)
MCV: 92.1 fL (ref 80.0–100.0)
Platelets: 116 10*3/uL — ABNORMAL LOW (ref 150–400)
RBC: 2.42 MIL/uL — ABNORMAL LOW (ref 3.87–5.11)
RDW: 15.2 % (ref 11.5–15.5)
WBC: 12.4 10*3/uL — ABNORMAL HIGH (ref 4.0–10.5)
nRBC: 0.2 % (ref 0.0–0.2)

## 2023-08-11 LAB — GLUCOSE, CAPILLARY
Glucose-Capillary: 174 mg/dL — ABNORMAL HIGH (ref 70–99)
Glucose-Capillary: 190 mg/dL — ABNORMAL HIGH (ref 70–99)
Glucose-Capillary: 204 mg/dL — ABNORMAL HIGH (ref 70–99)
Glucose-Capillary: 221 mg/dL — ABNORMAL HIGH (ref 70–99)
Glucose-Capillary: 236 mg/dL — ABNORMAL HIGH (ref 70–99)
Glucose-Capillary: 251 mg/dL — ABNORMAL HIGH (ref 70–99)

## 2023-08-11 LAB — BASIC METABOLIC PANEL
Anion gap: 12 (ref 5–15)
BUN: 95 mg/dL — ABNORMAL HIGH (ref 8–23)
CO2: 14 mmol/L — ABNORMAL LOW (ref 22–32)
Calcium: 8 mg/dL — ABNORMAL LOW (ref 8.9–10.3)
Chloride: 115 mmol/L — ABNORMAL HIGH (ref 98–111)
Creatinine, Ser: 1.92 mg/dL — ABNORMAL HIGH (ref 0.44–1.00)
GFR, Estimated: 29 mL/min — ABNORMAL LOW (ref >60)
Glucose, Bld: 271 mg/dL — ABNORMAL HIGH (ref 70–99)
Potassium: 4.3 mmol/L (ref 3.5–5.1)
Sodium: 141 mmol/L (ref 135–145)

## 2023-08-11 LAB — TRIGLYCERIDES: Triglycerides: 160 mg/dL — ABNORMAL HIGH (ref <150)

## 2023-08-11 LAB — MAGNESIUM: Magnesium: 2.3 mg/dL (ref 1.7–2.4)

## 2023-08-11 LAB — PHOSPHORUS: Phosphorus: 3.8 mg/dL (ref 2.5–4.6)

## 2023-08-11 SURGERY — INSERTION OF GASTROSTOMY TUBE
Anesthesia: General | Site: Abdomen

## 2023-08-11 MED ORDER — INSULIN ASPART 100 UNIT/ML IJ SOLN
3.0000 [IU] | INTRAMUSCULAR | Status: DC
  Administered 2023-08-11 – 2023-08-13 (×4): 3 [IU] via SUBCUTANEOUS
  Filled 2023-08-11 (×4): qty 1

## 2023-08-11 MED ORDER — POLYETHYLENE GLYCOL 3350 17 G PO PACK
17.0000 g | PACK | Freq: Every day | ORAL | Status: DC
  Administered 2023-08-14: 17 g
  Filled 2023-08-11: qty 1

## 2023-08-11 MED ORDER — BUPIVACAINE-EPINEPHRINE (PF) 0.25% -1:200000 IJ SOLN
INTRAMUSCULAR | Status: DC | PRN
  Administered 2023-08-11: 50 mL via INTRAMUSCULAR

## 2023-08-11 MED ORDER — SODIUM CHLORIDE 0.9 % IV SOLN: INTRAVENOUS | Status: AC | PRN

## 2023-08-11 MED ORDER — PROPOFOL 10 MG/ML IV BOLUS
INTRAVENOUS | Status: DC | PRN
  Administered 2023-08-11: 30 mg via INTRAVENOUS

## 2023-08-11 MED ORDER — FENTANYL CITRATE (PF) 100 MCG/2ML IJ SOLN
INTRAMUSCULAR | Status: AC
  Filled 2023-08-11: qty 2

## 2023-08-11 MED ORDER — PHENYLEPHRINE CONCENTRATED 100MG/250ML (0.4 MG/ML) INFUSION SIMPLE
0.0000 ug/min | INTRAVENOUS | Status: DC
  Administered 2023-08-11: 20 ug/min via INTRAVENOUS
  Administered 2023-08-12: 70 ug/min via INTRAVENOUS
  Filled 2023-08-11 (×5): qty 250

## 2023-08-11 MED ORDER — MIDAZOLAM HCL 2 MG/2ML IJ SOLN
2.0000 mg | Freq: Once | INTRAMUSCULAR | Status: AC
Start: 2023-08-11 — End: 2023-08-11
  Administered 2023-08-11: 2 mg via INTRAVENOUS

## 2023-08-11 MED ORDER — MIDAZOLAM HCL 2 MG/2ML IJ SOLN
INTRAMUSCULAR | Status: AC
  Filled 2023-08-11: qty 2

## 2023-08-11 MED ORDER — FENTANYL CITRATE PF 50 MCG/ML IJ SOSY
25.0000 ug | PREFILLED_SYRINGE | INTRAMUSCULAR | Status: DC | PRN
  Administered 2023-08-14 (×2): 100 ug via INTRAVENOUS
  Administered 2023-08-14: 75 ug via INTRAVENOUS
  Administered 2023-08-14 – 2023-08-15 (×11): 100 ug via INTRAVENOUS
  Administered 2023-08-16 (×2): 50 ug via INTRAVENOUS
  Administered 2023-08-16 (×2): 100 ug via INTRAVENOUS
  Administered 2023-08-17 (×2): 50 ug via INTRAVENOUS
  Filled 2023-08-11 (×6): qty 2
  Filled 2023-08-11: qty 1
  Filled 2023-08-11: qty 2
  Filled 2023-08-11 (×3): qty 1
  Filled 2023-08-11 (×9): qty 2
  Filled 2023-08-11: qty 1
  Filled 2023-08-11 (×2): qty 2

## 2023-08-11 MED ORDER — TRAVASOL 10 % IV SOLN
INTRAVENOUS | Status: DC
  Filled 2023-08-11: qty 1044

## 2023-08-11 MED ORDER — BUPIVACAINE LIPOSOME 1.3 % IJ SUSP
INTRAMUSCULAR | Status: AC
  Filled 2023-08-11: qty 20

## 2023-08-11 MED ORDER — TRAVASOL 10 % IV SOLN
INTRAVENOUS | Status: DC
  Filled 2023-08-11 (×2): qty 1044

## 2023-08-11 MED ORDER — NOREPINEPHRINE 16 MG/250ML-% IV SOLN
0.0000 ug/min | INTRAVENOUS | Status: DC
  Filled 2023-08-11: qty 250

## 2023-08-11 MED ORDER — DEXMEDETOMIDINE HCL IN NACL 80 MCG/20ML IV SOLN
INTRAVENOUS | Status: DC | PRN
  Administered 2023-08-11: 12 ug via INTRAVENOUS

## 2023-08-11 MED ORDER — BUPIVACAINE-EPINEPHRINE (PF) 0.25% -1:200000 IJ SOLN
INTRAMUSCULAR | Status: AC
  Filled 2023-08-11: qty 30

## 2023-08-11 MED ORDER — FENTANYL CITRATE (PF) 100 MCG/2ML IJ SOLN
INTRAMUSCULAR | Status: DC | PRN
  Administered 2023-08-11 (×2): 25 ug via INTRAVENOUS

## 2023-08-11 MED ORDER — ROCURONIUM BROMIDE 10 MG/ML (PF) SYRINGE
PREFILLED_SYRINGE | INTRAVENOUS | Status: DC | PRN
  Administered 2023-08-11: 10 mg via INTRAVENOUS
  Administered 2023-08-11: 20 mg via INTRAVENOUS

## 2023-08-11 MED ORDER — FENTANYL CITRATE PF 50 MCG/ML IJ SOSY
25.0000 ug | PREFILLED_SYRINGE | INTRAMUSCULAR | Status: DC | PRN
  Administered 2023-08-11: 25 ug via INTRAVENOUS

## 2023-08-11 MED ORDER — PROPOFOL 10 MG/ML IV BOLUS
INTRAVENOUS | Status: AC
  Filled 2023-08-11: qty 20

## 2023-08-11 MED ORDER — STERILE WATER FOR INJECTION IV SOLN
INTRAMUSCULAR | Status: DC
  Filled 2023-08-11 (×2): qty 1000
  Filled 2023-08-11: qty 150
  Filled 2023-08-11 (×3): qty 1000
  Filled 2023-08-11: qty 150
  Filled 2023-08-11: qty 1000
  Filled 2023-08-11: qty 150

## 2023-08-11 MED ORDER — CEFAZOLIN SODIUM-DEXTROSE 2-3 GM-%(50ML) IV SOLR
INTRAVENOUS | Status: DC | PRN
  Administered 2023-08-11: 2 g via INTRAVENOUS

## 2023-08-11 MED ORDER — DOCUSATE SODIUM 50 MG/5ML PO LIQD
100.0000 mg | Freq: Two times a day (BID) | ORAL | Status: DC
  Administered 2023-08-13 – 2023-08-14 (×2): 100 mg
  Filled 2023-08-11 (×3): qty 10

## 2023-08-11 MED ORDER — HEPARIN SODIUM (PORCINE) 5000 UNIT/ML IJ SOLN
5000.0000 [IU] | Freq: Three times a day (TID) | INTRAMUSCULAR | Status: DC
  Administered 2023-08-11 – 2023-08-12 (×2): 5000 [IU] via SUBCUTANEOUS
  Filled 2023-08-11 (×2): qty 1

## 2023-08-11 MED ORDER — PHENYLEPHRINE HCL (PRESSORS) 10 MG/ML IV SOLN
INTRAVENOUS | Status: DC | PRN
  Administered 2023-08-11 (×3): 80 ug via INTRAVENOUS

## 2023-08-11 MED ORDER — 0.9 % SODIUM CHLORIDE (POUR BTL) OPTIME
TOPICAL | Status: DC | PRN
  Administered 2023-08-11: 500 mL

## 2023-08-11 SURGICAL SUPPLY — 39 items
BAG URINE DRAIN 2000ML AR STRL (UROLOGICAL SUPPLIES) IMPLANT
BLADE SURG 15 STRL LF DISP TIS (BLADE) ×1 IMPLANT
CHLORAPREP W/TINT 26 (MISCELLANEOUS) ×1 IMPLANT
DRAPE LAPAROTOMY 100X77 ABD (DRAPES) ×1 IMPLANT
DRSG OPSITE POSTOP 4X6 (GAUZE/BANDAGES/DRESSINGS) IMPLANT
DRSG TELFA 3X4 N-ADH STERILE (GAUZE/BANDAGES/DRESSINGS) IMPLANT
ELECT REM PT RETURN 9FT ADLT (ELECTROSURGICAL) ×1 IMPLANT
ELECTRODE REM PT RTRN 9FT ADLT (ELECTROSURGICAL) ×1 IMPLANT
G-TUBE MIC 18FR ENFIT ADLT (TUBING) IMPLANT
G-TUBE MIC ADLT 16FR ENFIT (TUBING) IMPLANT
GAUZE SPONGE 4X4 12PLY STRL (GAUZE/BANDAGES/DRESSINGS) IMPLANT
GLOVE BIO SURGEON STRL SZ7 (GLOVE) ×1 IMPLANT
GOWN STRL REUS W/ TWL LRG LVL3 (GOWN DISPOSABLE) ×2 IMPLANT
LABEL OR SOLS (LABEL) ×1 IMPLANT
MANIFOLD NEPTUNE II (INSTRUMENTS) ×1 IMPLANT
NDL HYPO 22X1.5 SAFETY MO (MISCELLANEOUS) ×1 IMPLANT
NEEDLE HYPO 22X1.5 SAFETY MO (MISCELLANEOUS) ×1 IMPLANT
NS IRRIG 1000ML POUR BTL (IV SOLUTION) ×1 IMPLANT
PACK BASIN MINOR ARMC (MISCELLANEOUS) ×1 IMPLANT
PLUG CATH AND CAP STRL 200 (CATHETERS) IMPLANT
SPONGE DRAIN TRACH 4X4 STRL 2S (GAUZE/BANDAGES/DRESSINGS) IMPLANT
SPONGE T-LAP 18X18 ~~LOC~~+RFID (SPONGE) ×1 IMPLANT
STAPLER SKIN PROX 35W (STAPLE) IMPLANT
SUT ETHILON 3-0 FS-10 30 BLK (SUTURE) ×1 IMPLANT
SUT MNCRL 4-0 27 PS-2 XMFL (SUTURE) ×1 IMPLANT
SUT PDS AB 1 TP1 96 (SUTURE) IMPLANT
SUT PDS PLUS AB 0 CT-2 (SUTURE) ×1 IMPLANT
SUT SILK 2 0 SH (SUTURE) ×2 IMPLANT
SUT SILK 2 0 SH CR/8 (SUTURE) IMPLANT
SUTURE EHLN 3-0 FS-10 30 BLK (SUTURE) IMPLANT
SUTURE MNCRL 4-0 27XMF (SUTURE) IMPLANT
SYR 20ML LL LF (SYRINGE) IMPLANT
SYR 30ML LL (SYRINGE) ×1 IMPLANT
SYR TOOMEY 50ML (SYRINGE) ×1 IMPLANT
TRAP FLUID SMOKE EVACUATOR (MISCELLANEOUS) ×1 IMPLANT
TUBE GASTRO 18FR ENFIT (TUBING) ×1 IMPLANT
TUBE GSTRM 1 16FR INTNL ENFIT (TUBING) IMPLANT
TUBE MOSS GAS 18FR (TUBING) IMPLANT
WATER STERILE IRR 500ML POUR (IV SOLUTION) ×1 IMPLANT

## 2023-08-11 NOTE — Transfer of Care (Signed)
 Immediate Anesthesia Transfer of Care Note  Patient: [name]  Procedure(s) Performed: INSERTION OF GASTROSTOMY TUBE (Abdomen)  Patient Location: ICU  Anesthesia Type:General  Level of Consciousness: drowsy  Airway & Oxygen Therapy: Patient remains intubated per anesthesia plan and Patient placed on Ventilator (see vital sign flow sheet for setting)  Post-op Assessment: Report given to RN  Post vital signs: stable  Last Vitals:  Vitals Value Taken Time  BP    Temp    Pulse    Resp    SpO2      Last Pain:  Vitals:   08/11/23 1253  TempSrc: Oral  PainSc:       Patients Stated Pain Goal: 0 (07/29/23 1955)  Complications: No notable events documented.

## 2023-08-11 NOTE — Op Note (Signed)
 Gastrostomy tube placement    Pre-operative Diagnosis: malnutrition, Dysphagia w esophageal stricture   Post-operative Diagnosis: same   Procedure:  Gastrostomy tube placement  # 16 FR   Surgeon: Laneta Luna, MD FACS   Anesthesia: Gen. with endotracheal tube   Findings: G tube within gastric lumen w/o injuries   Estimated Blood Loss:5 cc             Complications: none     Procedure Details  The patient was seen again in the Holding Room. The benefits, complications, treatment options, and expected outcomes were discussed with the Family. The risks of bleeding, infection, recurrence of symptoms, failure to resolve symptoms, bowel injury, any of which could require further surgery were reviewed .   The  family concurred with the proposed plan, giving informed consent.  The patient was taken to Operating Room, identified  and the procedure verified. A Time Out was held and the above information confirmed.   Prior to the induction of general anesthesia, antibiotic prophylaxis was administered. VTE prophylaxis was in place. General endotracheal anesthesia was then administered and tolerated well. After the induction, the abdomen was prepped with Chloraprep and draped in the sterile fashion. The patient was positioned in the supine position.   Midline laparotomy performed with 10 blade knife, fascia elevated and incised. Abdominal cavity entered w/o any injuries. THe stomach was grasped with a Babcock and  I performed a gastrotomy and confirmed that I was intraluminally. Two purse string sutures were placed around gastrostomy . I placed the G-tube through the gastrotomy in direct fashion.  I flushed saline via the G-tube confirming the proper position of the tube intraluminally, no leaks observed.  The balloon was inflated and pulled back.  We then were able to tied the first purse strings in the standard fashion to the abdominal wall. Inspection of the  upper quadrant was performed. No  bleeding, bile duct injury or leak, or bowel injury was noted.  All the needles were removed under direct visualization.Liposomal Marcaine  was used to infiltrate the abdominal wall at all incision sites Fascia closed w 0 PDS using small bite technique in the standard fashion.staplers were used close the skin. Dressing was  applied. Sponge, lap, and needle counts were correct at closure and at the conclusion of the case.  No complications

## 2023-08-11 NOTE — Consult Note (Signed)
 [facility] SURGICAL CONSULTATION NOTE (initial) - cpt: [id number]   HISTORY OF PRESENT ILLNESS (HPI):  68 y.o. female initially presented to [facility] on 01/21 for scheduled posterior cervical laminoplasty (C3-C6) and C2 inferior dome laminectomy with Dr Claudene following a fall over a year ago which has resulted in a spinal cord injury with symptomatic stenosis from C2-C6. On 01/25, she developed left arm weakness and left lower extremity weakness and sensation loss. MRI on 01/25 was concerning for epidural hematoma. She underwent multi-level laminectomies (C3-C6, T2-3, T5-6, and T8-9) for epidural hematoma evacuation. She did also get pneumonia on 01/24 which was managed with help of medicine service. Attempts were made at [facility] placement immediately after second procedure but these were unsuccessful. Attempts with IR also unsuccessful reportedly secondary to esophageal stenosis. She was then started on TPN. Patient did require re-intubation on 01/31 secondary to hypoxic respiratory failure, extubated on 02/01 and re-intubated on 02/02 again due to hypoxia and mental status change. She has undergone additional MRI on 02/04 which was without evidence of spinal cord infarct.   Surgery is consulted by PCCM provider Inge Lecher, NP in this context for evaluation of surgical G-tube placement. IR and GI consulted but no window for G-tube placement. She did have CT Abdomen/Pelvis on 02/03 which does show the stomach posterior to the left lobe of the liver. Most recent labs from 02/04 show leukocytosis to 12.4K (improved from 24.4K), Hgb to 7.3 (from 8.9), worsening AKI with sCr - 1.92, glucose 271.  She is currently on Phenylephrine  50 mcg/min and vasopressin  0.03 units  Also on Ceftazidime  and Vancomycin .   PAST MEDICAL HISTORY (PMH):  Past Medical History:  Diagnosis Date   Acute incomplete quadriplegia (HCC)    Acute respiratory failure with hypoxia (HCC)    AKI (acute kidney injury)  (HCC)    Arthritis    Blood dyscrasia    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   Central cord syndrome (HCC)    Dental caries    Diabetes mellitus without complication (HCC)    Diverticulosis    Dysphagia    Esophageal stricture    Facial cellulitis    GERD (gastroesophageal reflux disease)    Hypothyroidism    Neuropathy    Odynophagia    Pelvic fracture (HCC)    Renal insufficiency    Spinal cord compression, post-traumatic (HCC)    Spinal cord injury, cervical region (HCC)    Stage 3a chronic kidney disease (CKD) (HCC)      PAST SURGICAL HISTORY (PSH):  Past Surgical History:  Procedure Laterality Date   ABDOMINAL HYSTERECTOMY     BILATERAL SALPINGECTOMY Bilateral 03/16/2017   Procedure: BILATERAL SALPINGECTOMY;  Surgeon: Schermerhorn, Debby PARAS, MD;  Location: [facility] ORS;  Service: Gynecology;  Laterality: Bilateral;   BIOPSY  05/03/2023   Procedure: BIOPSY;  Surgeon: Leigh Elspeth SQUIBB, MD;  Location: [facility] ENDOSCOPY;  Service: Gastroenterology;;   COLONOSCOPY WITH PROPOFOL  N/A 08/18/2018   Procedure: COLONOSCOPY WITH PROPOFOL ;  Surgeon: Viktoria Lamar DASEN, MD;  Location: [facility] ENDOSCOPY;  Service: Endoscopy;  Laterality: N/A;   CYSTOCELE REPAIR N/A 03/16/2017   Procedure: ANTERIOR REPAIR (CYSTOCELE);  Surgeon: Schermerhorn, Debby PARAS, MD;  Location: [facility] ORS;  Service: Gynecology;  Laterality: N/A;   ESOPHAGOGASTRODUODENOSCOPY (EGD) WITH PROPOFOL  N/A 08/18/2018   Procedure: ESOPHAGOGASTRODUODENOSCOPY (EGD) WITH PROPOFOL ;  Surgeon: Viktoria Lamar DASEN, MD;  Location: [facility] ENDOSCOPY;  Service: Endoscopy;  Laterality: N/A;   ESOPHAGOGASTRODUODENOSCOPY (EGD) WITH PROPOFOL  Left 05/01/2023   Procedure: ESOPHAGOGASTRODUODENOSCOPY (EGD) WITH  PROPOFOL ;  Surgeon: Leigh Elspeth SQUIBB, MD;  Location: [facility] ENDOSCOPY;  Service: Gastroenterology;  Laterality: Left;   ESOPHAGOGASTRODUODENOSCOPY (EGD) WITH PROPOFOL  N/A 05/03/2023   Procedure: ESOPHAGOGASTRODUODENOSCOPY (EGD) WITH PROPOFOL ;  Surgeon:  Leigh Elspeth SQUIBB, MD;  Location: [facility] ENDOSCOPY;  Service: Gastroenterology;  Laterality: N/A;   POSTERIOR CERVICAL LAMINECTOMY N/A 07/28/2023   Procedure: C2 DOME LAMINECTOMY;  Surgeon: [name] ORN, MD;  Location: [facility] ORS;  Service: Neurosurgery;  Laterality: N/A;   POSTERIOR CERVICAL LAMINECTOMY N/A 08/01/2023   Procedure: POSTERIOR CERVICAL LAMINECTOMY;  Surgeon: [name] ORN, MD;  Location: [facility] ORS;  Service: Neurosurgery;  Laterality: N/A;   TEAR DUCT PROBING Right    TUBAL LIGATION     VAGINAL HYSTERECTOMY N/A 03/16/2017   Procedure: HYSTERECTOMY VAGINAL;  Surgeon: Schermerhorn, Debby PARAS, MD;  Location: [facility] ORS;  Service: Gynecology;  Laterality: N/A;   WISDOM TOOTH EXTRACTION       MEDICATIONS:  Prior to Admission medications   Medication Sig Start Date End Date Taking? Authorizing Provider  acetaminophen  (TYLENOL ) 650 MG CR tablet Take 650 mg by mouth every 8 (eight) hours as needed for pain.   Yes [provider]  chlorhexidine  (HIBICLENS ) 4 % external liquid Apply 15 mLs (1 Application total) topically as directed for 30 doses. Use as directed daily for 5 days every other week for 6 weeks. 07/28/23  Yes Ulis Bottcher, PA-C  cyanocobalamin  (VITAMIN B12) 1000 MCG tablet Take 1,000 mcg by mouth daily.   Yes [provider]  cyclobenzaprine  (FLEXERIL ) 10 MG tablet Take 1 tablet (10 mg total) by mouth at bedtime. 06/15/23  Yes Lovorn, Megan, MD  diclofenac  Sodium (VOLTAREN ) 1 % GEL Apply 2 g topically 4 (four) times daily. Patient taking differently: Apply 2 g topically 4 (four) times daily as needed (pain). 05/11/23  Yes Setzer, Nena PARAS, PA-C  fludrocortisone  (FLORINEF ) 0.1 MG tablet Take 2 tablets (0.2 mg total) by mouth daily. For orthostatic hypotension 06/15/23  Yes Lovorn, Megan, MD  gabapentin  (NEURONTIN ) 100 MG capsule Take 200 mg by mouth 2 (two) times daily.  08/16/15  Yes [provider]  glipiZIDE  (GLUCOTROL ) 5 MG tablet Take 10 mg by mouth  daily before breakfast.   Yes [provider]  JARDIANCE  25 MG TABS tablet Take 25 mg by mouth daily.   Yes [provider]  lactase (LACTAID) 3000 units tablet Take 3,000 Units by mouth daily before breakfast.   Yes [provider]  levothyroxine  (SYNTHROID ) 88 MCG tablet Take 88 mcg by mouth daily before breakfast.   Yes [provider]  midodrine  (PROAMATINE ) 10 MG tablet Take 1.5 tablets (15 mg total) by mouth 3 (three) times daily with meals. For orthostatic hypotension 06/15/23  Yes Lovorn, Megan, MD  Multiple Vitamin (MULTIVITAMIN WITH MINERALS) TABS tablet Take 1 tablet by mouth daily. 04/22/23  Yes Alexander, Natalie, DO  mupirocin  ointment (BACTROBAN ) 2 % Place 1 Application into the nose 2 (two) times daily for 60 doses. Use as directed 2 times daily for 5 days every other week for 6 weeks. 07/28/23 08/27/23 Yes Ulis Bottcher, PA-C  pioglitazone  (ACTOS ) 30 MG tablet Take 30 mg by mouth daily.   Yes [provider]  Psyllium (METAMUCIL 3 IN 1 DAILY FIBER PO) Take 2 capsules by mouth daily.   Yes [provider]  rosuvastatin  (CRESTOR ) 5 MG tablet Take 5 mg by mouth at bedtime.   Yes [provider]  senna (SENOKOT) 8.6 MG TABS tablet Take 1 tablet (8.6 mg  total) by mouth at bedtime. 05/11/23  Yes Setzer, Nena PARAS, PA-C     ALLERGIES:  Allergies  Allergen Reactions   Amoxicillin -Pot Clavulanate Itching and Anaphylaxis   Ampicillin Rash     SOCIAL HISTORY:  Social History   Socioeconomic History   Marital status: Married    Spouse name: Evalene   Number of children: Not on file   Years of education: Not on file   Highest education level: Not on file  Occupational History   Not on file  Tobacco Use   Smoking status: Never   Smokeless tobacco: Never  Vaping Use   Vaping status: Never Used  Substance and Sexual Activity   Alcohol use: No   Drug use: No   Sexual activity: Not Currently  Other Topics Concern    Not on file  Social History Narrative   Not on file   Social Drivers of Health   Financial Resource Strain: Not on file  Food Insecurity: No Food Insecurity (07/28/2023)   Hunger Vital Sign    Worried About Running Out of Food in the Last Year: Never true    Ran Out of Food in the Last Year: Never true  Transportation Needs: No Transportation Needs (07/28/2023)   PRAPARE - Administrator, Civil Service (Medical): No    Lack of Transportation (Non-Medical): No  Physical Activity: Not on file  Stress: Not on file  Social Connections: Moderately Integrated (07/28/2023)   Social Connection and Isolation Panel [NHANES]    Frequency of Communication with Friends and Family: Once a week    Frequency of Social Gatherings with Friends and Family: Once a week    Attends Religious Services: 1 to 4 times per year    Active Member of [employer] or Organizations: Yes    Attends Banker Meetings: 1 to 4 times per year    Marital Status: Married  Catering Manager Violence: Not At Risk (07/28/2023)   Humiliation, Afraid, Rape, and Kick questionnaire    Fear of Current or Ex-Partner: No    Emotionally Abused: No    Physically Abused: No    Sexually Abused: No     FAMILY HISTORY:  Family History  Problem Relation Age of Onset   Breast cancer Maternal Aunt    Hypertension Mother    Hypertension Father    Diabetes Father       REVIEW OF SYSTEMS:  Review of Systems  Unable to perform ROS: Intubated    VITAL SIGNS:  Temp:  [95.9 F (35.5 C)-100 F (37.8 C)] 100 F (37.8 C) (02/04 1253) Pulse Rate:  [79-154] 90 (02/04 1215) Resp:  [14-34] 27 (02/04 1215) BP: (104-211)/(52-77) 156/63 (02/04 1200) SpO2:  [94 %-100 %] 97 % (02/04 1215) Arterial Line BP: (62-201)/(36-75) 153/60 (02/04 1215) FiO2 (%):  [24 %-30 %] 24 % (02/04 1134) Weight:  [63.3 kg] 63.3 kg (02/04 0320)     Height: 5' 7 (170.2 cm) Weight: 63.3 kg BMI (Calculated): 21.85   INTAKE/OUTPUT:  02/03 0701 -  02/04 0700 In: 4201.8 [I.V.:2963.4; IV Piggyback:1238.4] Out: 1750 [Urine:1750]  PHYSICAL EXAM:  Physical Exam Vitals and nursing note reviewed. Exam conducted with a chaperone present.  Constitutional:      General: She is not in acute distress.    Interventions: She is sedated and intubated.     Comments: Intubated, sedated Bedside RN present  HENT:     Head: Normocephalic and atraumatic.  Cardiovascular:     Rate and Rhythm:  Normal rate and regular rhythm.     Pulses: Normal pulses.     Heart sounds: No murmur heard. Pulmonary:     Effort: She is intubated.     Breath sounds: Normal breath sounds.     Comments: Intubated; on ventilator  Abdominal:     General: Abdomen is flat. There is no distension.     Palpations: Abdomen is soft.     Comments: Abdomen is soft, non-distended, unable to assess tenderness secondary to intubation/sedation   Genitourinary:    Comments: Foley in place  Musculoskeletal:     Comments: In unna boot bilaterally   Skin:    General: Skin is warm and dry.  Psychiatric:     Comments: Unable to reliably assess      Labs:     Latest Ref Rng & Units 08/11/2023    3:51 AM 08/10/2023    3:33 AM 08/09/2023    6:37 AM  CBC  WBC 4.0 - 10.5 K/uL 12.4  24.4  10.6   Hemoglobin 12.0 - 15.0 g/dL 7.3  8.9  7.9   Hematocrit 36.0 - 46.0 % 22.3  27.0  25.1   Platelets 150 - 400 K/uL 116  188  173       Latest Ref Rng & Units 08/11/2023    3:51 AM 08/10/2023    3:33 AM 08/09/2023    3:51 AM  CMP  Glucose 70 - 99 mg/dL 728  825  871   BUN 8 - 23 mg/dL 95  80  68   Creatinine 0.44 - 1.00 mg/dL 8.07  8.43  9.24   Sodium 135 - 145 mmol/L 141  142  146   Potassium 3.5 - 5.1 mmol/L 4.3  4.6  4.0   Chloride 98 - 111 mmol/L 115  119  125   CO2 22 - 32 mmol/L 14  14  12    Calcium  8.9 - 10.3 mg/dL 8.0  8.2  8.7   Total Protein 6.5 - 8.1 g/dL  6.0    Total Bilirubin 0.0 - 1.2 mg/dL  0.6    Alkaline Phos 38 - 126 U/L  59    AST 15 - 41 U/L  25    ALT 0 - 44 U/L  14       Imaging studies:   CT Abdomen/Pelvis (02/03) personally reviewed, stomach is in LUQ and posterior to the liver, and radiologist report reviewed below:  IMPRESSION: 1. Irregular wall thickening in the proximal ascending colon within the pelvis with some associated dilatation of the cecum. Although some of this may be attributable to peristalsis, findings are suspicious for a possible colon carcinoma. Consider correlation with colonoscopy if the patient is able to tolerate colonoscopy in the future. 2. The stomach is positioned entirely posterior to the elongated left lobe of the liver and posteriorly displaced. Anatomy is not amenable to attempted percutaneous gastrostomy tube placement. Consider general surgical evaluation if the patient cannot maintain nutritional needs. 3. Residual atelectasis/consolidation of the posterior left lower lobe with trace left pleural fluid. 4. Cholelithiasis without evidence of acute cholecystitis. 5. Severely atrophic and calcified left kidney. 6. Subcutaneous edema along the posterolateral aspect of the right hip extending into the visualized proximal thigh and gluteal region without overt skin breakdown or subcutaneous gas. Regional cellulitis is not excluded and correlation suggested clinically. 7. Aortic atherosclerosis.   Assessment/Plan: (ICD-10's: E44.0) 68 y.o. female with acute hypoxic respiratory failure requiring mechanical ventilation and vasopressor support s/p initial posterior cervical  laminoplasty (C3-C6) and C2 inferior dome laminectomy on 01/21 complicated by epidural hematoma requiring multilevel laminectomy and evacuation on 01/25, complicated by malnutrition and inability to pass nasogastric feeding tube secondary to esophageal stenosis   - Case discussed at length with the patient's husband Kimberlly Norgard [phone]) regarding his wife's clinical condition, prognosis, and current nutritional status. Discussed role for  placement of gastrostomy tube in this setting and inability to place this in a minimally invasive (ie: PEG) fashion as her stomach anatomically is behind the liver. He seems to be understanding of this and is agreeable.  - We will plan on open placement of gastrostomy tube today (02/04) with Dr Jordis pending OR/Anesthesia availability  - All risks, benefits, and alternatives to above  procedure(s) were discussed with the patient's husband Shereka Lafortune) via telephone in the presence of the patient's bedside RN, and his questions were answered to his expressed satisfaction, patient's husband expresses verbal consent  All of the above findings and recommendations were discussed with the patient's husband, and all of patient's husband's questions were answered to his expressed satisfaction.  Thank you for the opportunity to participate in this patient's care.   -- Arthea Platt, PA-C [facility] 08/11/2023, 12:55 PM M-F: 7am - 4pm

## 2023-08-11 NOTE — Plan of Care (Signed)
  Problem: Education: Goal: Ability to describe self-care measures that may prevent or decrease complications (Diabetes Survival Skills Education) will improve Outcome: Progressing   Problem: Coping: Goal: Ability to adjust to condition or change in health will improve Outcome: Progressing   Problem: Fluid Volume: Goal: Ability to maintain a balanced intake and output will improve Outcome: Progressing   Problem: Health Behavior/Discharge Planning: Goal: Ability to identify and utilize available resources and services will improve Outcome: Progressing Goal: Ability to manage health-related needs will improve Outcome: Progressing   Problem: Metabolic: Goal: Ability to maintain appropriate glucose levels will improve Outcome: Progressing   Problem: Nutritional: Goal: Maintenance of adequate nutrition will improve Outcome: Progressing Goal: Progress toward achieving an optimal weight will improve Outcome: Progressing   Problem: Skin Integrity: Goal: Risk for impaired skin integrity will decrease Outcome: Progressing   Problem: Tissue Perfusion: Goal: Adequacy of tissue perfusion will improve Outcome: Progressing   Problem: Education: Goal: Knowledge of General Education information will improve Description: Including pain rating scale, medication(s)/side effects and non-pharmacologic comfort measures Outcome: Progressing   Problem: Health Behavior/Discharge Planning: Goal: Ability to manage health-related needs will improve Outcome: Progressing   Problem: Clinical Measurements: Goal: Ability to maintain clinical measurements within normal limits will improve Outcome: Progressing Goal: Will remain free from infection Outcome: Progressing Goal: Diagnostic test results will improve Outcome: Progressing Goal: Respiratory complications will improve Outcome: Progressing Goal: Cardiovascular complication will be avoided Outcome: Progressing   Problem: Activity: Goal:  Risk for activity intolerance will decrease Outcome: Progressing   Problem: Nutrition: Goal: Adequate nutrition will be maintained Outcome: Progressing   Problem: Coping: Goal: Level of anxiety will decrease Outcome: Progressing   Problem: Elimination: Goal: Will not experience complications related to bowel motility Outcome: Progressing Goal: Will not experience complications related to urinary retention Outcome: Progressing   Problem: Pain Managment: Goal: General experience of comfort will improve and/or be controlled Outcome: Progressing   Problem: Safety: Goal: Ability to remain free from injury will improve Outcome: Progressing   Problem: Skin Integrity: Goal: Risk for impaired skin integrity will decrease Outcome: Progressing   Problem: Education: Goal: Ability to verbalize activity precautions or restrictions will improve Outcome: Progressing Goal: Knowledge of the prescribed therapeutic regimen will improve Outcome: Progressing Goal: Understanding of discharge needs will improve Outcome: Progressing   Problem: Activity: Goal: Ability to avoid complications of mobility impairment will improve Outcome: Progressing Goal: Ability to tolerate increased activity will improve Outcome: Progressing Goal: Will remain free from falls Outcome: Progressing   Problem: Bowel/Gastric: Goal: Gastrointestinal status for postoperative course will improve Outcome: Progressing   Problem: Clinical Measurements: Goal: Ability to maintain clinical measurements within normal limits will improve Outcome: Progressing Goal: Postoperative complications will be avoided or minimized Outcome: Progressing Goal: Diagnostic test results will improve Outcome: Progressing   Problem: Pain Management: Goal: Pain level will decrease Outcome: Progressing   Problem: Skin Integrity: Goal: Will show signs of wound healing Outcome: Progressing   Problem: Health Behavior/Discharge  Planning: Goal: Identification of resources available to assist in meeting health care needs will improve Outcome: Progressing   Problem: Bladder/Genitourinary: Goal: Urinary functional status for postoperative course will improve Outcome: Progressing

## 2023-08-11 NOTE — Anesthesia Preprocedure Evaluation (Signed)
 Anesthesia Evaluation  Patient identified by MRN, date of birth, ID band Patient awake  General Assessment Comment:Patient with PSH of cervical spine surgery on 07/28/2023. Patient then found to have epidural hematoma cause left sided weakness. Patient is intubated and sedated, requiring vasopressin  and phenylephrine    Reviewed: Allergy & Precautions, NPO status , Patient's Chart, lab work & pertinent test results  History of Anesthesia Complications Negative for: history of anesthetic complications  Airway Mallampati: Intubated  TM Distance: >3 FB     Dental  (+) Edentulous Upper, Edentulous Lower   Pulmonary pneumonia, unresolved   Pulmonary exam normal breath sounds clear to auscultation       Cardiovascular negative cardio ROS Normal cardiovascular exam Rhythm:Regular Rate:Normal  ECG 04/13/23: NSR; RBBB  Echo 07/16/22: NORMAL STRESS ECHOCARDIOGRAM. NORMAL RESTING STUDY WITH NO WALL MOTION ABNORMALITIES AT REST AND PEAK EXERCISE.  Maximum workload of 5.7 METs was achieved during exercise.     Neuro/Psych  Neuromuscular disease (neuropathy) negative neurological ROS  negative psych ROS   GI/Hepatic negative GI ROS, Neg liver ROS,GERD  ,,  Endo/Other  negative endocrine ROSdiabetes, Type 2Hypothyroidism    Renal/GU Renal disease     Musculoskeletal  (+) Arthritis ,    Abdominal   Peds  Hematology negative hematology ROS (+) Blood dyscrasia, anemia Possible bleeding disorder    Anesthesia Other Findings  Past Medical History: No date: Acute incomplete quadriplegia (HCC) No date: Acute respiratory failure with hypoxia (HCC) No date: AKI (acute kidney injury) (HCC) No date: Arthritis No date: Blood dyscrasia     Comment:  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 No date: Central cord syndrome (HCC) No date: Dental caries No date: Diabetes mellitus without complication (HCC) No date: Diverticulosis No date:  Dysphagia No date: Esophageal stricture No date: Facial cellulitis No date: GERD (gastroesophageal reflux disease) No date: Hypothyroidism No date: Neuropathy No date: Odynophagia No date: Pelvic fracture (HCC) No date: Renal insufficiency No date: Spinal cord compression, post-traumatic (HCC) No date: Spinal cord injury, cervical region (HCC) No date: Stage 3a chronic kidney disease (CKD) (HCC)  Past Surgical History: No date: ABDOMINAL HYSTERECTOMY 03/16/2017: BILATERAL SALPINGECTOMY; Bilateral     Comment:  Procedure: BILATERAL SALPINGECTOMY;  Surgeon:               Schermerhorn, Debby PARAS, MD;  Location: [facility];                Service: Gynecology;  Laterality: Bilateral; 05/03/2023: BIOPSY     Comment:  Procedure: BIOPSY;  Surgeon: Leigh Elspeth SQUIBB, MD;                Location: [facility];  Service: Gastroenterology;; 08/18/2018: COLONOSCOPY WITH PROPOFOL ; N/A     Comment:  Procedure: COLONOSCOPY WITH PROPOFOL ;  Surgeon: Viktoria Lamar DASEN, MD;  Location: [facility];  Service:               Endoscopy;  Laterality: N/A; 03/16/2017: CYSTOCELE REPAIR; N/A     Comment:  Procedure: ANTERIOR REPAIR (CYSTOCELE);  Surgeon:               Schermerhorn, Debby PARAS, MD;  Location: [facility];                Service: Gynecology;  Laterality: N/A; 08/18/2018: ESOPHAGOGASTRODUODENOSCOPY (EGD) WITH PROPOFOL ; N/A     Comment:  Procedure: ESOPHAGOGASTRODUODENOSCOPY (EGD) WITH  PROPOFOL ;  Surgeon: Viktoria Lamar DASEN, MD;  Location:               [facility];  Service: Endoscopy;  Laterality: N/A; 05/01/2023: ESOPHAGOGASTRODUODENOSCOPY (EGD) WITH PROPOFOL ; Left     Comment:  Procedure: ESOPHAGOGASTRODUODENOSCOPY (EGD) WITH               PROPOFOL ;  Surgeon: Leigh Elspeth SQUIBB, MD;  Location:               [facility];  Service: Gastroenterology;  Laterality:               Left; 05/03/2023: ESOPHAGOGASTRODUODENOSCOPY (EGD) WITH PROPOFOL ; N/A     Comment:   Procedure: ESOPHAGOGASTRODUODENOSCOPY (EGD) WITH               PROPOFOL ;  Surgeon: Leigh Elspeth SQUIBB, MD;  Location:               [facility];  Service: Gastroenterology;  Laterality:               N/A; 07/28/2023: POSTERIOR CERVICAL LAMINECTOMY; N/A     Comment:  Procedure: C2 DOME LAMINECTOMY;  Surgeon: [name] ORN, MD;  Location: [facility];  Service: Neurosurgery;                Laterality: N/A; No date: TEAR DUCT PROBING; Right No date: TUBAL LIGATION 03/16/2017: VAGINAL HYSTERECTOMY; N/A     Comment:  Procedure: HYSTERECTOMY VAGINAL;  Surgeon: Lovetta Debby PARAS, MD;  Location: [facility];  Service: Gynecology;               Laterality: N/A; No date: WISDOM TOOTH EXTRACTION  BMI    Body Mass Index: 20.34 kg/m      Reproductive/Obstetrics negative OB ROS                             Anesthesia Physical Anesthesia Plan  ASA: 4  Anesthesia Plan: General ETT   Post-op Pain Management: Minimal or no pain anticipated   Induction: Intravenous  PONV Risk Score and Plan: 3 and Ondansetron  and Dexamethasone   Airway Management Planned: Oral ETT  Additional Equipment: Arterial line  Intra-op Plan:   Post-operative Plan: Post-operative intubation/ventilation  Informed Consent: I have reviewed the patients History and Physical, chart, labs and discussed the procedure including the risks, benefits and alternatives for the proposed anesthesia with the patient or authorized representative who has indicated his/her understanding and acceptance.     Dental Advisory Given and Consent reviewed with POA  Plan Discussed with: Anesthesiologist, CRNA and Surgeon  Anesthesia Plan Comments: (Patient consented for risks of anesthesia including but not limited to:  - adverse reactions to medications - damage to eyes, teeth, lips or other oral mucosa - nerve damage due to positioning  - sore throat or hoarseness - Damage to  heart, brain, nerves, lungs, other parts of body or loss of life  Patient voiced understanding and assent.)       Anesthesia Quick Evaluation

## 2023-08-11 NOTE — Anesthesia Postprocedure Evaluation (Signed)
 Anesthesia Post Note  Patient: [name]  Procedure(s) Performed: INSERTION OF GASTROSTOMY TUBE (Abdomen)  Patient location during evaluation: ICU Anesthesia Type: General Level of consciousness: patient remains intubated per anesthesia plan Pain management: pain level controlled Vital Signs Assessment: post-procedure vital signs reviewed and stable Respiratory status: patient remains intubated per anesthesia plan and patient on ventilator - see flowsheet for VS Cardiovascular status: blood pressure returned to baseline Anesthetic complications: no   No notable events documented.   Last Vitals:  Vitals:   08/11/23 1300 08/11/23 1553  BP:    Pulse: 87   Resp: (!) 23   Temp:    SpO2: 96% (!) 88%    Last Pain:  Vitals:   08/11/23 1253  TempSrc: Oral  PainSc:                  [name]

## 2023-08-11 NOTE — Progress Notes (Signed)
 NAME:  [name], MRN:  [id number], DOB:  Jun 30, 1956, LOS: 14 ADMISSION DATE:  07/28/2023, CHIEF COMPLAINT:  Cervical Myelopathy   History of Present Illness:   [name] is a 68 year old old female with PMHx of hypothyroidism, insulin -dependent DM, CKD stage III, dysphagia with esophageal strictures, facial cellulitis, GERD, neuropathy, arthritis, blood dyscrasia, and recent traumatic cervical central cord injury who presented to the hospital for elective cervical spine surgery.   Per patient's chart, patient was seen by neurosurgery on 07/28/2023 for follow-up after suffering a fall 3 months ago in which she sustained a cervical spinal cord injury.  Per patient, they deferred emergent surgical depression and wanted to let her have time to recover and work with physical therapy.  She continued to have hand weakness as well as lower extremity weakness and ambulatory issues.  Given the ongoing compression of C3-C6, laminoplasty and C2 dome laminectomy was indicated.     Hospital course: Patient underwent C3-6 laminoplasty and C2 dome and laminectomy surgery on 07/29/2023 with drain placement.   Pertinent Labs/Diagnostics Findings: Na+/ K+: 141/4.4 Glucose: 136 BUN/Cr.47/1.42 WBC: 5.8 K/L  Hgb/Hct: 7.4/22.4  Pertinent  Medical History  Hypothyroidism, insulin -dependent DM, CKD stage III, dysphagia with esophageal strictures, facial cellulitis, GERD, neuropathy, arthritis, blood dyscrasia, and recent traumatic cervical central cord injury  Significant Hospital Events: Including procedures, antibiotic start and stop dates in addition to other pertinent events   01/21: Patient underwent posterior C3-6 Cervical Laminoplasty with C2 inferior dome Laminectomy 01/22: POD #1. Pt. stable with no acute changes.  Working with PT and OT 01/23: POD #2.  Pt. noted to be very lethargic and difficult to arouse 01/24: POD #3. Pt. developed hypoxia tachycardia and shortness of breath.  Hospitalist  consulted CT chest obtained and showed possible pneumonia.  Started on IV antibiotics and DuoNebs. 01/25: POD #4. Pt. with new concerns of not moving the left arm.  CT head and cervical spine obtained with no new concerns.  Follow-up MRI however demonstrated epidural hematoma extending from the cervical spine to the lower thoracic spine with acute compression and T2 signal.  Taken emergently to the OR for surgical decompression 01/26: Pt. transferred to the ICU s/p emergent cervical decompression and evacuation of epidural hematoma.  Remained intubated. PCCM consulted 08/03/23- s/p surgery overnight, alert to self but drowsy with mild AKI and metabolic acidosis.  Lethargy noted have dcd cefepime  and started doxycycline  instead.  08/04/23- patient more alert able to wiggle toes to verbal.  Remains critically ill.  08/05/23- patient appears to have hypoactive delerium with waxing and waning sensorium.  At times she asks for things other times she's poorly responsive.  Her oxygenation has improved with nasal canula 5L/min.  S/p heme/onc evaluation today 08/06/23- patient seen at bedside, she was able to whisper few words tome.  I discussed PO intake with speech pathologist and SLP with barium was ordered however patient unable to complete this study. She is still very weak physically barely able to wiggle toes.  H/h <7 s/p prbc.  Renal function is improved.  08/07/23- patient had worsening hypoxemia with CXR showing worsening complete left opacification. I discussed this with neurosurgery and they stated her cervical spine did not have any new hardware so intubation should not be complicated. I met with PA Edsel Goods at bedside  to review care plan. We discussed these findings with husband and proceeded with intubation and bronchoscopy.  The entire left lung appeared completely full of mucus impaction in both upper and lower  lobes during bronchoscopy.  CXR post bronchoscopy is in process.  08/08/23- patient is  extubated and is able to speak few words with family.  She moved her feet and right hand today.  I met with husband and son at bedside reviewed imaging and bloodwork. 08/09/23- patient had severe event overnight with hypoxemia , CXR This am with complete opacification of left lung again.  She went into respiratory distress overnight with severe hypoxemia.  She was emergently intubated overnight. WE notified familly and discussed events and imaging findings. She is s/p bronch with improved CXR post procedure.  08/10/23 - remains weak and minimally responsive, flaccid upper and lower extremities 08/11/23 - MRI performed overnight. Remains minimally responsive but does open eyes and track.  Interim History / Subjective:  Opens eyes and tracks. Does not move extremities.  Objective   Blood pressure (!) 156/63, pulse 87, temperature 100 F (37.8 C), temperature source Oral, resp. rate (!) 23, height 5' 7 (1.702 m), weight 63.3 kg, SpO2 96%.    Vent Mode: PRVC FiO2 (%):  [24 %-30 %] 24 % Set Rate:  [16 bmp] 16 bmp Vt Set:  [450 mL] 450 mL PEEP:  [5 cmH20-8 cmH20] 8 cmH20 Pressure Support:  [5 cmH20] 5 cmH20 Plateau Pressure:  [21 cmH20-31 cmH20] 31 cmH20   Intake/Output Summary (Last 24 hours) at 08/11/2023 1319 Last data filed at 08/11/2023 1307 Gross per 24 hour  Intake 3925.17 ml  Output 2415 ml  Net 1510.17 ml   Filed Weights   08/09/23 0400 08/10/23 0500 08/11/23 0320  Weight: 58.9 kg 60.2 kg 63.3 kg    Examination: Physical Exam Constitutional:      General: She is not in acute distress.    Appearance: She is ill-appearing.  Cardiovascular:     Rate and Rhythm: Normal rate and regular rhythm.     Pulses: Normal pulses.     Heart sounds: Normal heart sounds.  Pulmonary:     Comments: Ventilated breath sounds bilaterally Abdominal:     Palpations: Abdomen is soft.  Neurological:     Mental Status: She is disoriented.     Motor: Weakness present.     Comments: Tracks with eyes,  unable to move upper or lower extremities      Assessment & Plan:   #Acute Hypoxic Respiratory Failure #Aspiration Pneumonia #Toxic Metabolic Encephalopathy #Cervical Myelopathy s/p C2 dome laminectomy, C3-C6 laminoplasty; complicated by epidural hematoma s/p surgical decompression #Blood loss anemia #AKI on CKD stage III #T2DM #Hypothyroidism  Neuro - C2 laminectomy, C3-C6 laminoplasty, complicated by epidural hematoma requiring surgical decompression. Course complicated by metabolic encephalopathy with concern for lower and upper extremity weakness. CT head and neck without acute bleed, but given persistent symptoms and history of epidural hematoma an MRI of the brain and spine was obtained with no restricted diffusion suggestive of damage rather than infarct. Continue to target RASS of -1. Able to pull volumes with SBT suggesting preservation of phrenic nerve, but given significant lower and upper extremity weakness I suspect she will be ventilator dependent at least for the short to medium term.  CV - was on phenylephrine  for sedation related hypotension. Concern for spinal shock given significant spinal cord injury. Was having atrial ectopy with nor-epinephrine  so we will hold that for now.  Pulm - intubated for respiratory failure secondary to encephalopthy complicated by aspiration pneumonia and white out of the left lung. Now s/p flexible bronchoscopy for therapeutic aspiration of secretions, with improved aeration of the left lung.  On mechanical ventilation, and performed well with SBT but mental status and weakness is barrier to extubation. Continue hypertonic saline and aggressive pulmonary toilet.  Renal - AKI on CKD, with non-anion gap metabolic acidosis. Received sodium bicarb yesterday for volume repletion. Monitoring electrolytes and kidney function closely, avoiding nephrotoxins as able. Metabolic acidosis could be secondary to TPN.  GI - initiated on TPN given inability to  pass an oral feeding tube due to esophageal strictures. Patient will require long term enteral access, not a candidate for PEG percutaneously with IR or GI. Will consult surgery. Continue PPI for SUP.  Endo - on basal bolus regimen of insulin  with glargine 5 units daily, aspart 3 units every 4 hours, and sliding scale.  Hem/Onc - enoxaparin  for DVT prophylaxis, will switch to heparin  5000 twice daily given AKI and concern for bleed. Will increase heparin  to TID. H/H and platelet count stable.  ID - aspiration pneumonia secondary to encephalopathy and recent surgery with inability to protect the airway. Respiratory cultures previously grew Candida Glabrata, which is likely a colonizer but with rising white count and initiation of TPN, I am concerned for fungemia and will obtain blood cultures. Will also broaden IV antibiotics to cover for HAP with Ceftazidime  (avoiding Cefepime  due to encephalopathy) and Vancomycin .   Best Practice (right click and Reselect all SmartList Selections daily)   Diet/type: TPN DVT prophylaxis prophylactic heparin   Pressure ulcer(s): N/A GI prophylaxis: PPI Lines: Central line and yes and it is still needed Foley:  Yes, and it is still needed Code Status:  full code Last date of multidisciplinary goals of care discussion [08/11/2023]  Labs   CBC: Recent Labs  Lab 08/07/23 0346 08/07/23 1756 08/08/23 0405 08/09/23 0637 08/10/23 0333 08/11/23 0351  WBC 12.4*  --  13.0* 10.6* 24.4* 12.4*  HGB 8.3* 8.3* 9.0* 7.9* 8.9* 7.3*  HCT 25.3* 27.1* 26.4* 25.1* 27.0* 22.3*  MCV 89.7  --  94.6 94.4 97.1 92.1  PLT 194  --  201 173 188 116*    Basic Metabolic Panel: Recent Labs  Lab 08/07/23 0346 08/07/23 1150 08/08/23 0405 08/08/23 0641 08/09/23 0351 08/10/23 0333 08/11/23 0351  NA 153* 149* 146*  --  146* 142 141  K 4.5 5.2* 4.3  --  4.0 4.6 4.3  CL 127* 126* 125*  --  125* 119* 115*  CO2 16* 14* 14*  --  12* 14* 14*  GLUCOSE 214* 281* 232*  --  128* 174*  271*  BUN 71* 78* 70*  --  68* 80* 95*  CREATININE 0.88 1.08* 0.91  --  0.75 1.56* 1.92*  CALCIUM  8.9 9.1 8.7*  --  8.7* 8.2* 8.0*  MG 2.1  --   --  1.9 1.6* 2.7* 2.3  PHOS 3.3  --  2.6  --  2.5 4.0 3.8   GFR: Estimated Creatinine Clearance: 28.4 mL/min (A) (by C-G formula based on SCr of 1.92 mg/dL (H)). Recent Labs  Lab 08/08/23 0405 08/09/23 0637 08/09/23 0750 08/10/23 0333 08/11/23 0351  WBC 13.0* 10.6*  --  24.4* 12.4*  LATICACIDVEN  --   --  0.8  --   --     Liver Function Tests: Recent Labs  Lab 08/05/23 0403 08/06/23 0348 08/07/23 0346 08/08/23 0405 08/09/23 0351 08/10/23 0333  AST 19 20  --   --   --  25  ALT 15 16  --   --   --  14  ALKPHOS 46 49  --   --   --  59  BILITOT 1.0 0.7  --   --   --  0.6  PROT 5.7* 6.1*  --   --   --  6.0*  ALBUMIN  2.9* 3.2* 3.0* 3.4* 3.1* 3.2*   No results for input(s): LIPASE, AMYLASE in the last 168 hours. No results for input(s): AMMONIA in the last 168 hours.  ABG    Component Value Date/Time   PHART 7.23 (L) 08/09/2023 0637   PCO2ART 31 (L) 08/09/2023 0637   PO2ART 88 08/09/2023 0637   HCO3 13.0 (L) 08/09/2023 0637   ACIDBASEDEF 13.3 (H) 08/09/2023 0637   O2SAT 98.5 08/09/2023 0637     Coagulation Profile: Recent Labs  Lab 08/05/23 1811  INR 1.2    Cardiac Enzymes: No results for input(s): CKTOTAL, CKMB, CKMBINDEX, TROPONINI in the last 168 hours.  HbA1C: Hemoglobin A1C  Date/Time Value Ref Range Status  02/20/2013 04:02 AM 10.3 (H) 4.2 - 6.3 % Final    Comment:    The American Diabetes Association recommends that a primary goal of therapy should be <7% and that physicians should reevaluate the treatment regimen in patients with HbA1c values consistently >8%.    Hgb A1c MFr Bld  Date/Time Value Ref Range Status  04/13/2023 11:34 AM 5.9 (H) 4.8 - 5.6 % Final    Comment:    (NOTE) Pre diabetes:          5.7%-6.4%  Diabetes:              >6.4%  Glycemic control for   <7.0% adults  with diabetes   08/11/2021 08:39 PM 7.2 (H) 4.8 - 5.6 % Final    Comment:    (NOTE) Pre diabetes:          5.7%-6.4%  Diabetes:              >6.4%  Glycemic control for   <7.0% adults with diabetes     CBG: Recent Labs  Lab 08/10/23 1919 08/10/23 2310 08/11/23 0312 08/11/23 0730 08/11/23 1117  GLUCAP 213* 194* 251* 236* 204*    Review of Systems:   Unable to obtain  Past Medical History:  She,  has a past medical history of Acute incomplete quadriplegia (HCC), Acute respiratory failure with hypoxia (HCC), AKI (acute kidney injury) (HCC), Arthritis, Blood dyscrasia, Central cord syndrome (HCC), Dental caries, Diabetes mellitus without complication (HCC), Diverticulosis, Dysphagia, Esophageal stricture, Facial cellulitis, GERD (gastroesophageal reflux disease), Hypothyroidism, Neuropathy, Odynophagia, Pelvic fracture (HCC), Renal insufficiency, Spinal cord compression, post-traumatic (HCC), Spinal cord injury, cervical region (HCC), and Stage 3a chronic kidney disease (CKD) (HCC).   Surgical History:   Past Surgical History:  Procedure Laterality Date   ABDOMINAL HYSTERECTOMY     BILATERAL SALPINGECTOMY Bilateral 03/16/2017   Procedure: BILATERAL SALPINGECTOMY;  Surgeon: Schermerhorn, Debby PARAS, MD;  Location: [facility] ORS;  Service: Gynecology;  Laterality: Bilateral;   BIOPSY  05/03/2023   Procedure: BIOPSY;  Surgeon: Leigh Elspeth SQUIBB, MD;  Location: [facility] ENDOSCOPY;  Service: Gastroenterology;;   COLONOSCOPY WITH PROPOFOL  N/A 08/18/2018   Procedure: COLONOSCOPY WITH PROPOFOL ;  Surgeon: Viktoria Lamar DASEN, MD;  Location: [facility] ENDOSCOPY;  Service: Endoscopy;  Laterality: N/A;   CYSTOCELE REPAIR N/A 03/16/2017   Procedure: ANTERIOR REPAIR (CYSTOCELE);  Surgeon: Schermerhorn, Debby PARAS, MD;  Location: [facility] ORS;  Service: Gynecology;  Laterality: N/A;   ESOPHAGOGASTRODUODENOSCOPY (EGD) WITH PROPOFOL  N/A 08/18/2018   Procedure: ESOPHAGOGASTRODUODENOSCOPY (EGD) WITH PROPOFOL ;  Surgeon:  Viktoria Lamar DASEN, MD;  Location[facility] ENDOSCOPY;  Service: Endoscopy;  Laterality: N/A;  ESOPHAGOGASTRODUODENOSCOPY (EGD) WITH PROPOFOL  Left 05/01/2023   Procedure: ESOPHAGOGASTRODUODENOSCOPY (EGD) WITH PROPOFOL ;  Surgeon: Leigh Elspeth SQUIBB, MD;  Location: [facility] ENDOSCOPY;  Service: Gastroenterology;  Laterality: Left;   ESOPHAGOGASTRODUODENOSCOPY (EGD) WITH PROPOFOL  N/A 05/03/2023   Procedure: ESOPHAGOGASTRODUODENOSCOPY (EGD) WITH PROPOFOL ;  Surgeon: Leigh Elspeth SQUIBB, MD;  Location: [facility] ENDOSCOPY;  Service: Gastroenterology;  Laterality: N/A;   POSTERIOR CERVICAL LAMINECTOMY N/A 07/28/2023   Procedure: C2 DOME LAMINECTOMY;  Surgeon: [name] ORN, MD;  Location: [facility] ORS;  Service: Neurosurgery;  Laterality: N/A;   POSTERIOR CERVICAL LAMINECTOMY N/A 08/01/2023   Procedure: POSTERIOR CERVICAL LAMINECTOMY;  Surgeon: [name] ORN, MD;  Location: [facility] ORS;  Service: Neurosurgery;  Laterality: N/A;   TEAR DUCT PROBING Right    TUBAL LIGATION     VAGINAL HYSTERECTOMY N/A 03/16/2017   Procedure: HYSTERECTOMY VAGINAL;  Surgeon: Schermerhorn, Debby PARAS, MD;  Location: [facility] ORS;  Service: Gynecology;  Laterality: N/A;   WISDOM TOOTH EXTRACTION       Social History:   reports that she has never smoked. She has never used smokeless tobacco. She reports that she does not drink alcohol and does not use drugs.   Family History:  Her family history includes Breast cancer in her maternal aunt; Diabetes in her father; Hypertension in her father and mother.   Allergies Allergies  Allergen Reactions   Amoxicillin -Pot Clavulanate Itching and Anaphylaxis   Ampicillin Rash     Home Medications  Prior to Admission medications   Medication Sig Start Date End Date Taking? Authorizing Provider  acetaminophen  (TYLENOL ) 650 MG CR tablet Take 650 mg by mouth every 8 (eight) hours as needed for pain.   Yes [provider]  chlorhexidine  (HIBICLENS ) 4 % external liquid Apply 15 mLs (1 Application  total) topically as directed for 30 doses. Use as directed daily for 5 days every other week for 6 weeks. 07/28/23  Yes Ulis Bottcher, PA-C  cyanocobalamin  (VITAMIN B12) 1000 MCG tablet Take 1,000 mcg by mouth daily.   Yes [provider]  cyclobenzaprine  (FLEXERIL ) 10 MG tablet Take 1 tablet (10 mg total) by mouth at bedtime. 06/15/23  Yes Lovorn, Megan, MD  diclofenac  Sodium (VOLTAREN ) 1 % GEL Apply 2 g topically 4 (four) times daily. Patient taking differently: Apply 2 g topically 4 (four) times daily as needed (pain). 05/11/23  Yes Setzer, Nena PARAS, PA-C  fludrocortisone  (FLORINEF ) 0.1 MG tablet Take 2 tablets (0.2 mg total) by mouth daily. For orthostatic hypotension 06/15/23  Yes Lovorn, Megan, MD  gabapentin  (NEURONTIN ) 100 MG capsule Take 200 mg by mouth 2 (two) times daily.  08/16/15  Yes [provider]  glipiZIDE  (GLUCOTROL ) 5 MG tablet Take 10 mg by mouth daily before breakfast.   Yes [provider]  JARDIANCE  25 MG TABS tablet Take 25 mg by mouth daily.   Yes [provider]  lactase (LACTAID) 3000 units tablet Take 3,000 Units by mouth daily before breakfast.   Yes [provider]  levothyroxine  (SYNTHROID ) 88 MCG tablet Take 88 mcg by mouth daily before breakfast.   Yes [provider]  midodrine  (PROAMATINE ) 10 MG tablet Take 1.5 tablets (15 mg total) by mouth 3 (three) times daily with meals. For orthostatic hypotension 06/15/23  Yes Lovorn, Megan, MD  Multiple Vitamin (MULTIVITAMIN WITH MINERALS) TABS tablet Take 1 tablet by mouth daily. 04/22/23  Yes Alexander, Natalie, DO  mupirocin  ointment (BACTROBAN ) 2 % Place 1 Application into the nose 2 (two) times daily for 60 doses. Use as directed  2 times daily for 5 days every other week for 6 weeks. 07/28/23 08/27/23 Yes Ulis Bottcher, PA-C  pioglitazone  (ACTOS ) 30 MG tablet Take 30 mg by mouth daily.   Yes [provider]  Psyllium (METAMUCIL 3 IN 1 DAILY FIBER PO) Take 2  capsules by mouth daily.   Yes [provider]  rosuvastatin  (CRESTOR ) 5 MG tablet Take 5 mg by mouth at bedtime.   Yes [provider]  senna (SENOKOT) 8.6 MG TABS tablet Take 1 tablet (8.6 mg total) by mouth at bedtime. 05/11/23  Yes Setzer, Nena PARAS, PA-C     Critical care time: 47 minutes    Belva November, MD [facility] 08/11/2023 1:28 PM

## 2023-08-11 NOTE — Progress Notes (Signed)
 Neurosurgery Progress Note  History: [name] is here for C2 dome laminectomy, C3-6 laminoplasty and now s/p evacuation hematoma  POD13/10: MRI this morning does not appear to be consistent with spinal cord infarct.  Patient's neurologic exam is unchanged. POD12/9: Pt had neuro change early this morning prompting CT head and C spine.  POD 11/7: Was extubated this morning prior to evaluation.  Was able to track with eyes but was quite somnolent still.  Was reacting to painful stimuli in the bilateral lower extremities.  Left upper extremity was reacting to painful stimuli as well. POD 10/6: Pt experiencing increased work of breathing this morning and worsening findings on chest xray resulting in reintubation this morning. She is currently undergoing bronchoscopy by Dr. Aleskerov  POD 9/5: Pt more alert this morning asking what day it is  POD 8/4: Very drowsy but arouses this morning to voice.  POD 7/3: Pt is very drowsy, but able to follow some commands  POD6/2: Pt is drowsy this morning asking what do you want when awaken  Interval POD5: [name] had a decline yesterday with her left upper extremity and then bilateral lower extremities.  CT scan was unremarkable MRI did show a large epidural hematoma.  She was taken emergently to the OR by Dr. Claudene for decompression.  She has been monitored in the ICU overnight. She is still intubated and on sedation. She is on pressors to maintain [facility]   Hospital Course POD4: [name] is more awake today. She states she isnt having a lot of pain but she is thirsty.  POD3:Patient more alert this AM. However; hypoxia, tachycardia, SOB, low energy.  POD2: Very lethargic and difficult to arouse, states in quite a bit of pain. Medicine had been held due to sleepiness.    Physical Exam: Vitals:   08/11/23 0800 08/11/23 0808  BP: (!) 134/59   Pulse: 87 86  Resp: (!) 23 (!) 24  Temp: 98.2 F (36.8 C)   SpO2: 98% 96%    Intubated. Opens eyes  to voice Does not withdrawal in extremities to pain.  Appears to have random and involuntary movements in her feet bilaterally.  Data:  Output by Drain (mL) 08/09/23 0701 - 08/09/23 1900 08/09/23 1901 - 08/10/23 0700 08/10/23 0701 - 08/10/23 1900 08/10/23 1901 - 08/11/23 0700 08/11/23 0701 - 08/11/23 0837  Requested LDAs do not have output data documented.     Assessment/Plan:  [name]  s/p C2 dome laminectomy, C3-6 laminoplasty  On 1/21 with return to OR on 1/25 for cervical/thoracic laminectomy and epidural hematoma evacuation.  She has had a complicated postoperative course with a spontaneous cervical thoracic hemorrhage on 125 which was taken emergently for decompression.  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  - MRI of neuro axis yesterday showing diffuse central spinal cord swelling concerning for spinal cord infarct.  DWI series repeated this morning which did not show restricted diffusion.  -No further MAP management goals from neurosurgical standpoint - pain control continue with IV and oral medications -Given her immobilized status and risk for DVT PE we are resuming anticoagulation knowing that she would be at risk for repeat hemorrhage.  - PT/OT when able - HV removed on 1/29 - PICC placement and TPN started.  - pneumonia; recommendations per CC  - CC and IM assisting with non surgical management. We appreciate your assistance.  Edsel Goods PA-C Department of Neurosurgery

## 2023-08-11 NOTE — Progress Notes (Signed)
 PHARMACY - TOTAL PARENTERAL NUTRITION CONSULT NOTE   Indication: Unable to place NG tube for enteral nutrition due to severe esophageal stricture  Patient Measurements: Height: 5' 7 (170.2 cm) Weight: 63.3 kg (139 lb 8.8 oz) IBW/kg (Calculated) : 61.6 TPN AdjBW (KG): 58.9 Body mass index is 21.86 kg/m.  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   Glucose / Insulin :  --Last Hgb A1c was 5.9% on 04/13/23, has hx of diabetes --BG last 24h: 132 - 251 --SSI last 24h: 54u Electrolytes: Hyperchloremia Renal: Scr indicates AKI, Scr 1.92 (BL Scr < 1). UOP appears to be improving Hepatic: No evidence of hepatic insufficiency Intake / Output: +9.4L GI Imaging: None recent GI Surgeries / Procedures: None recent  Central access: 08/04/23 TPN start date: 08/04/23   RD Assessment: Estimated Needs Total Energy Estimated Needs: 1900-2200kcal/day Total Protein Estimated Needs: 95-110g/day Total Fluid Estimated Needs: 1.8-2.1L/day  Current Nutrition:   NPO, intubated. Does not have NG / OG tube. Multiple prior attempts to place have failed. Tentative plan is for PEG tube  Plan:  --Continue custom TPN 108 g protein, 280 g dextrose , 54 g lipids, 1.8L --Electrolytes in TPN: Na 50mEq/L, K 51mEq/L, Ca 4mEq/L, Mg 93mEq/L, and Phos 5mmol/L. Cl:Ac 1:2 --Continue rSSI q4h + STOP Novolog  3u q4h + continue Semglee  5u daily + ADD 20u regular insulin  to TPN --Re-check electrolytes in AM  [name] 08/11/2023,11:32 AM

## 2023-08-11 NOTE — Progress Notes (Signed)
 Foley removed 2/3 for voiding trial. Patient did not spontaneously void during day shift. MRI scans showed distended bladder. I&O X 1 performed overnight obtaining 900. Night shift RN was directed by NP Ouma to place new foley if patient did not spontaneously void post I&O indicated for spinal cord injury. Foley placed at 0300.

## 2023-08-11 NOTE — Progress Notes (Signed)
 Nutrition Follow Up Note   DOCUMENTATION CODES:   Non-severe (moderate) malnutrition in context of chronic illness  INTERVENTION:   Continue TPN per pharmacy- Can decrease to half rate 2/5 if tube feeds able to be initiated.   Once G-tube is ready for use:  Osmolite 1.5@60ml /hr- Initiate at 20ml/hr and increase by 10ml/hr q 8 hours until goal rate is reached.   ProSource TF 20- Give 60ml daily via tube, each supplement provides 80kcal and 20g of protein.   Free water  flushes q4 hours to  Regimen provides 2240kcal/day, 110g/day protein and 1645ml/day of free water .   Daily weights   NUTRITION DIAGNOSIS:   Moderate Malnutrition related to chronic illness as evidenced by mild fat depletion, moderate fat depletion, mild muscle depletion, severe muscle depletion. 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  Pt remains sedated and ventilated. G-tube placed by surgery today; will plan to initiate tube feeds 2/5 if ok per surgery. Recommend continue TPN until pt is able to tolerate tube feeds. No BM since 1/30. Per chart, pt is up ~10lbs since admission. Pt +9.2L on her I & Os.   Medications reviewed and include: colace, heparin , insulin , synthroid , protonix , miralax , albumin , fortaz , phenylephrine , TPN, levophed , Na Bicarbonate, vancomycin , vasopressin    Labs reviewed: K 4.3 wnl, BUN 95(H), creat 1.92H), P 3.8wnl, Mg 2.3 wnl Triglycerides-  160(H) Wbc- 12.4(H), Hgb 7.3(L), Hct 22.3(L) Cbgs- 221, 204, 236, 251 x 24 hrs  Patient is currently intubated on ventilator support MV: 9.1 L/min Temp (24hrs), Avg:97.9 F (36.6 C), Min:95.9 F (35.5 C), Max:100 F (37.8 C)  MAP- >9mmHg   UOP-   Diet Order:   Diet Order     None      EDUCATION NEEDS:   Not appropriate for education at this time  Skin:  Skin Assessment: Reviewed RN Assessment (incision back and neck)  Last BM:  1/30- type 1  Height:   Ht Readings from Last 1 Encounters:  07/28/23 5' 7 (1.702 m)    Weight:   Wt Readings from Last 1 Encounters:  08/11/23 63.3 kg    Ideal Body Weight:  61.36 kg  BMI:  Body mass index is 21.86 kg/m.  Estimated Nutritional Needs:   Kcal:  1900-2200kcal/day  Protein:  95-110g/day  Fluid:  1.8-2.1L/day  Augustin Shams MS, RD, LDN If unable to be reached, please send secure chat to RD inpatient available from 8:00a-4:00p daily

## 2023-08-11 NOTE — Consult Note (Signed)
 [facility] CONSULT NOTE    Date: 08/11/2023                  Patient Name:  [name]  MRN: [id number]  DOB: 1956/01/19  Age / Sex: 68 y.o., female         PCP: Valora Agent, MD                 Service Requesting Consult: Critical care                 Reason for Consult: Acute kidney injury            History of Present Illness: Patient is a 67 y.o. female with a PMHx of acute incomplete quadriplegia, history of easy bleeding, central cord syndrome, diabetes mellitus type 2, diverticulosis, GERD, hypothyroidism, peripheral neuropathy, who was admitted to [facility] on 07/28/2023 for C2 dome laminectomy, C3-6 laminoplasty with postoperative bleeding and epidural hematoma causing spinal cord compression s/p evacuation 08/01/2023 who we are asked to see now for acute kidney injury.  The patient has had fluctuating renal function recently.  Her baseline creatinine has varied from 0.75 as high as 1.4 to over the past several months.  Over the past several days she is experienced relative hypotension as well.  BUN currently up to 95 with a creatinine of 1.92.  Serum bicarbonate also low at 14.  Urine output was 1.3 L over the preceding 24 hours.   Medications: Outpatient medications: Medications Prior to Admission  Medication Sig Dispense Refill Last Dose/Taking   acetaminophen  (TYLENOL ) 650 MG CR tablet Take 650 mg by mouth every 8 (eight) hours as needed for pain.   Past Week   cyanocobalamin  (VITAMIN B12) 1000 MCG tablet Take 1,000 mcg by mouth daily.   07/28/2023 at  4:30 AM   cyclobenzaprine  (FLEXERIL ) 10 MG tablet Take 1 tablet (10 mg total) by mouth at bedtime. 30 tablet 5 Past Week   diclofenac  Sodium (VOLTAREN ) 1 % GEL Apply 2 g topically 4 (four) times daily. (Patient taking differently: Apply 2 g topically 4 (four) times daily as needed (pain).) 100 g 0 Past Week   fludrocortisone  (FLORINEF ) 0.1 MG tablet Take 2 tablets (0.2 mg total) by mouth daily. For  orthostatic hypotension 60 tablet 5 07/28/2023 at  4:30 AM   gabapentin  (NEURONTIN ) 100 MG capsule Take 200 mg by mouth 2 (two) times daily.    07/28/2023 at  4:30 AM   glipiZIDE  (GLUCOTROL ) 5 MG tablet Take 10 mg by mouth daily before breakfast.   Past Week   JARDIANCE  25 MG TABS tablet Take 25 mg by mouth daily.   Past Week   lactase (LACTAID) 3000 units tablet Take 3,000 Units by mouth daily before breakfast.   Past Week   levothyroxine  (SYNTHROID ) 88 MCG tablet Take 88 mcg by mouth daily before breakfast.   07/28/2023 at  4:30 AM   midodrine  (PROAMATINE ) 10 MG tablet Take 1.5 tablets (15 mg total) by mouth 3 (three) times daily with meals. For orthostatic hypotension 145 tablet 5 07/28/2023 at  4:30 AM   Multiple Vitamin (MULTIVITAMIN WITH MINERALS) TABS tablet Take 1 tablet by mouth daily.   07/28/2023 at  4:30 AM   pioglitazone  (ACTOS ) 30 MG tablet Take 30 mg by mouth daily.   Past Week   Psyllium (METAMUCIL 3 IN 1 DAILY FIBER PO) Take 2 capsules by mouth daily.   Past Week   rosuvastatin  (CRESTOR ) 5 MG tablet Take 5 mg  by mouth at bedtime.   Past Week   senna (SENOKOT) 8.6 MG TABS tablet Take 1 tablet (8.6 mg total) by mouth at bedtime. 120 tablet 0 Past Week    Current medications: Current Facility-Administered Medications  Medication Dose Route Frequency Provider Last Rate Last Admin   albumin  human 25 % solution 12.5 g  12.5 g Intravenous Daily Aleskerov, Fuad, MD 60 mL/hr at 08/11/23 0944 12.5 g at 08/11/23 0944   bisacodyl  (DULCOLAX) suppository 10 mg  10 mg Rectal Daily PRN Rust-Chester, Britton L, NP   10 mg at 08/11/23 1251   cefTAZidime  (FORTAZ ) 2 g in sodium chloride  0.9 % 100 mL IVPB  2 g Intravenous Q12H Isadora Hose, MD   Stopped at 08/11/23 0340   Chlorhexidine  Gluconate Cloth 2 % PADS 6 each  6 each Topical Daily Parris Manna, MD   6 each at 08/10/23 2224   dexmedetomidine  (PRECEDEX ) 400 MCG/100ML (4 mcg/mL) infusion  0-1.2 mcg/kg/hr Intravenous Titrated Isadora Hose, MD  7.53 mL/hr at 08/11/23 1307 0.5 mcg/kg/hr at 08/11/23 1307   docusate (COLACE) 50 MG/5ML liquid 100 mg  100 mg Per Tube BID Keene, Jeremiah D, NP       fentaNYL  (SUBLIMAZE ) injection 25 mcg  25 mcg Intravenous Q15 min PRN Keene, Jeremiah D, NP   25 mcg at 08/11/23 1259   fentaNYL  (SUBLIMAZE ) injection 25-100 mcg  25-100 mcg Intravenous Q30 min PRN Keene, Jeremiah D, NP       heparin  injection 5,000 Units  5,000 Units Subcutaneous Q8H Isadora Hose, MD       insulin  aspart (novoLOG ) injection 0-20 Units  0-20 Units Subcutaneous Q4H Rust-Chester, Jenita CROME, NP   7 Units at 08/11/23 1614   insulin  aspart (novoLOG ) injection 3 Units  3 Units Subcutaneous Q4H Chappell, Alex B, RPH   3 Units at 08/11/23 1614   insulin  glargine-yfgn (SEMGLEE ) injection 5 Units  5 Units Subcutaneous Daily Nada Adriana BIRCH, RPH   5 Units at 08/11/23 9077   levothyroxine  (SYNTHROID ) tablet 88 mcg  88 mcg Per Tube QAC breakfast Nada Adriana BIRCH, RPH   88 mcg at 08/08/23 9491   menthol -cetylpyridinium (CEPACOL) lozenge 3 mg  1 lozenge Oral PRN Ulis Bottcher, PA-C       mupirocin  ointment (BACTROBAN ) 2 % 1 Application  1 Application Nasal BID Aleskerov, Fuad, MD   1 Application at 08/11/23 0944   Oral care mouth rinse  15 mL Mouth Rinse Q2H Kathrene Almarie Bake, NP   15 mL at 08/11/23 1601   Oral care mouth rinse  15 mL Mouth Rinse PRN Kathrene Almarie Bake, NP       pantoprazole  (PROTONIX ) injection 40 mg  40 mg Intravenous Q24H Isadora Hose, MD   40 mg at 08/11/23 9076   phenylephrine  CONCENTRATED 100mg  in sodium chloride  0.9% (0.4mg /mL) premix infusion  0-400 mcg/min Intravenous Titrated Ouma, Elizabeth Achieng, NP 7.5 mL/hr at 08/11/23 1307 50 mcg/min at 08/11/23 1307   polyethylene glycol (MIRALAX  / GLYCOLAX ) packet 17 g  17 g Per Tube Daily Keene, Jeremiah D, NP       sodium chloride  flush (NS) 0.9 % injection 10-40 mL  10-40 mL Intracatheter Q12H [name] ORN, MD   10 mL at 08/11/23 0945   sodium  chloride flush (NS) 0.9 % injection 10-40 mL  10-40 mL Intracatheter PRN [name] ORN, MD       sodium chloride  flush (NS) 0.9 % injection 3-10 mL  3-10 mL Intravenous Q12H Ulis Bottcher,  PA-C   10 mL at 08/11/23 0945   sodium chloride  flush (NS) 0.9 % injection 3-10 mL  3-10 mL Intravenous PRN Ulis Bottcher, PA-C       TPN ADULT (ION)   Intravenous Continuous TPN Isadora Hose, MD 75 mL/hr at 08/11/23 1307 Infusion Verify at 08/11/23 1307   TPN ADULT (ION)   Intravenous Continuous TPN Chappell, Alex B, [facility]       vancomycin  variable dose per unstable renal function (pharmacist dosing)   Does not apply See admin instructions Clair Marolyn NOVAK RPH       vasopressin  (PITRESSIN) 20 Units in 100 mL (0.2 unit/mL) infusion-*FOR SHOCK*  0-0.03 Units/min Intravenous Continuous Shellia Mann D, NP 9 mL/hr at 08/11/23 1307 0.03 Units/min at 08/11/23 1307      Allergies: Allergies  Allergen Reactions   Amoxicillin -Pot Clavulanate Itching and Anaphylaxis   Ampicillin Rash      Past Medical History: Past Medical History:  Diagnosis Date   Acute incomplete quadriplegia (HCC)    Acute respiratory failure with hypoxia (HCC)    AKI (acute kidney injury) (HCC)    Arthritis    Blood dyscrasia    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   Central cord syndrome (HCC)    Dental caries    Diabetes mellitus without complication (HCC)    Diverticulosis    Dysphagia    Esophageal stricture    Facial cellulitis    GERD (gastroesophageal reflux disease)    Hypothyroidism    Neuropathy    Odynophagia    Pelvic fracture (HCC)    Renal insufficiency    Spinal cord compression, post-traumatic (HCC)    Spinal cord injury, cervical region (HCC)    Stage 3a chronic kidney disease (CKD) (HCC)      Past Surgical History: Past Surgical History:  Procedure Laterality Date   ABDOMINAL HYSTERECTOMY     BILATERAL SALPINGECTOMY Bilateral 03/16/2017   Procedure: BILATERAL SALPINGECTOMY;  Surgeon:  Schermerhorn, Debby PARAS, MD;  Location: [facility] ORS;  Service: Gynecology;  Laterality: Bilateral;   BIOPSY  05/03/2023   Procedure: BIOPSY;  Surgeon: Leigh Elspeth SQUIBB, MD;  Location: [facility] ENDOSCOPY;  Service: Gastroenterology;;   COLONOSCOPY WITH PROPOFOL  N/A 08/18/2018   Procedure: COLONOSCOPY WITH PROPOFOL ;  Surgeon: Viktoria Lamar DASEN, MD;  Location: [facility] ENDOSCOPY;  Service: Endoscopy;  Laterality: N/A;   CYSTOCELE REPAIR N/A 03/16/2017   Procedure: ANTERIOR REPAIR (CYSTOCELE);  Surgeon: Schermerhorn, Debby PARAS, MD;  Location: [facility] ORS;  Service: Gynecology;  Laterality: N/A;   ESOPHAGOGASTRODUODENOSCOPY (EGD) WITH PROPOFOL  N/A 08/18/2018   Procedure: ESOPHAGOGASTRODUODENOSCOPY (EGD) WITH PROPOFOL ;  Surgeon: Viktoria Lamar DASEN, MD;  Location: [facility] ENDOSCOPY;  Service: Endoscopy;  Laterality: N/A;   ESOPHAGOGASTRODUODENOSCOPY (EGD) WITH PROPOFOL  Left 05/01/2023   Procedure: ESOPHAGOGASTRODUODENOSCOPY (EGD) WITH PROPOFOL ;  Surgeon: Leigh Elspeth SQUIBB, MD;  Location: [facility] ENDOSCOPY;  Service: Gastroenterology;  Laterality: Left;   ESOPHAGOGASTRODUODENOSCOPY (EGD) WITH PROPOFOL  N/A 05/03/2023   Procedure: ESOPHAGOGASTRODUODENOSCOPY (EGD) WITH PROPOFOL ;  Surgeon: Leigh Elspeth SQUIBB, MD;  Location: [facility] ENDOSCOPY;  Service: Gastroenterology;  Laterality: N/A;   POSTERIOR CERVICAL LAMINECTOMY N/A 07/28/2023   Procedure: C2 DOME LAMINECTOMY;  Surgeon: [name] ORN, MD;  Location: [facility] ORS;  Service: Neurosurgery;  Laterality: N/A;   POSTERIOR CERVICAL LAMINECTOMY N/A 08/01/2023   Procedure: POSTERIOR CERVICAL LAMINECTOMY;  Surgeon: [name] ORN, MD;  Location: [facility] ORS;  Service: Neurosurgery;  Laterality: N/A;   TEAR DUCT PROBING Right    TUBAL LIGATION     VAGINAL HYSTERECTOMY N/A 03/16/2017  Procedure: HYSTERECTOMY VAGINAL;  Surgeon: Schermerhorn, Debby PARAS, MD;  Location: [facility] ORS;  Service: Gynecology;  Laterality: N/A;   WISDOM TOOTH EXTRACTION       Family History: Family History  Problem  Relation Age of Onset   Breast cancer Maternal Aunt    Hypertension Mother    Hypertension Father    Diabetes Father      Social History: Social History   Socioeconomic History   Marital status: Married    Spouse name: Evalene   Number of children: Not on file   Years of education: Not on file   Highest education level: Not on file  Occupational History   Not on file  Tobacco Use   Smoking status: Never   Smokeless tobacco: Never  Vaping Use   Vaping status: Never Used  Substance and Sexual Activity   Alcohol use: No   Drug use: No   Sexual activity: Not Currently  Other Topics Concern   Not on file  Social History Narrative   Not on file   Social Drivers of Health   Financial Resource Strain: Not on file  Food Insecurity: No Food Insecurity (07/28/2023)   Hunger Vital Sign    Worried About Running Out of Food in the Last Year: Never true    Ran Out of Food in the Last Year: Never true  Transportation Needs: No Transportation Needs (07/28/2023)   PRAPARE - Administrator, Civil Service (Medical): No    Lack of Transportation (Non-Medical): No  Physical Activity: Not on file  Stress: Not on file  Social Connections: Moderately Integrated (07/28/2023)   Social Connection and Isolation Panel [NHANES]    Frequency of Communication with Friends and Family: Once a week    Frequency of Social Gatherings with Friends and Family: Once a week    Attends Religious Services: 1 to 4 times per year    Active Member of [employer] or Organizations: Yes    Attends Banker Meetings: 1 to 4 times per year    Marital Status: Married  Catering Manager Violence: Not At Risk (07/28/2023)   Humiliation, Afraid, Rape, and Kick questionnaire    Fear of Current or Ex-Partner: No    Emotionally Abused: No    Physically Abused: No    Sexually Abused: No     Review of Systems: Patient unable to provide review of systems as she is currently on the ventilator  Vital  Signs: Blood pressure (!) 156/63, pulse 87, temperature 100 F (37.8 C), temperature source Oral, resp. rate (!) 23, height 5' 7 (1.702 m), weight 63.3 kg, SpO2 (!) 88%.  Weight trends: Filed Weights   08/09/23 0400 08/10/23 0500 08/11/23 0320  Weight: 58.9 kg 60.2 kg 63.3 kg     Physical Exam: General: Critically ill-appearing  Head: Normocephalic, atraumatic. Moist oral mucosal membranes  Eyes: Anicteric  Neck: Tracheostomy in place  Lungs:  Clear to auscultation, vent assisted  Heart: S1S2 no rubs  Abdomen:  Soft, nontender, bowel sounds present, PEG tube in place  Extremities: Trace peripheral edema.  Neurologic: Intubated  Skin: No acute rash  Access: No hemodialysis access    Lab results: Basic Metabolic Panel: Recent Labs  Lab 08/07/23 0346 08/07/23 1150 08/08/23 0641 08/09/23 0351 08/10/23 0333 08/11/23 0351  NA 153*   < >  --  146* 142 141  K 4.5   < >  --  4.0 4.6 4.3  CL 127*   < >  --  125* 119* 115*  CO2 16*   < >  --  12* 14* 14*  GLUCOSE 214*   < >  --  128* 174* 271*  BUN 71*   < >  --  68* 80* 95*  CREATININE 0.88   < >  --  0.75 1.56* 1.92*  CALCIUM  8.9   < >  --  8.7* 8.2* 8.0*  MG 2.1  --  1.9 1.6* 2.7* 2.3  PHOS 3.3   < >  --  2.5 4.0 3.8   < > = values in this interval not displayed.    Liver Function Tests: Recent Labs  Lab 08/05/23 0403 08/06/23 0348 08/07/23 0346 08/08/23 0405 08/09/23 0351 08/10/23 0333  AST 19 20  --   --   --  25  ALT 15 16  --   --   --  14  ALKPHOS 46 49  --   --   --  59  BILITOT 1.0 0.7  --   --   --  0.6  PROT 5.7* 6.1*  --   --   --  6.0*  ALBUMIN  2.9* 3.2*   < > 3.4* 3.1* 3.2*   < > = values in this interval not displayed.   No results for input(s): LIPASE, AMYLASE in the last 168 hours. No results for input(s): AMMONIA in the last 168 hours.  CBC: Recent Labs  Lab 08/07/23 0346 08/07/23 1756 08/08/23 0405 08/09/23 0637 08/10/23 0333 08/11/23 0351  WBC 12.4*  --  13.0* 10.6* 24.4*  12.4*  HGB 8.3*   < > 9.0* 7.9* 8.9* 7.3*  HCT 25.3*   < > 26.4* 25.1* 27.0* 22.3*  MCV 89.7  --  94.6 94.4 97.1 92.1  PLT 194  --  201 173 188 116*   < > = values in this interval not displayed.    Cardiac Enzymes: No results for input(s): CKTOTAL, CKMB, CKMBINDEX, TROPONINI in the last 168 hours.  BNP: Invalid input(s): POCBNP  CBG: Recent Labs  Lab 08/10/23 2310 08/11/23 0312 08/11/23 0730 08/11/23 1117 08/11/23 1606  GLUCAP 194* 251* 236* 204* 221*    Microbiology: Results for orders placed or performed during the hospital encounter of 07/28/23  MRSA Next Gen by PCR, Nasal     Status: Abnormal   Collection Time: 08/01/23  5:00 AM   Specimen: Nasal Mucosa; Nasal Swab  Result Value Ref Range Status   MRSA by PCR Next Gen DETECTED (A) NOT DETECTED Final    Comment: RESULT CALLED TO, READ BACK BY AND VERIFIED WITH: JOAN WILLIS @0923  08/01/23 MJU (NOTE) The GeneXpert MRSA Assay (FDA approved for NASAL specimens only), is one component of a comprehensive MRSA colonization surveillance program. It is not intended to diagnose MRSA infection nor to guide or monitor treatment for MRSA infections. Test performance is not FDA approved in patients less than 18 years old. Performed at [facility], [address] [place], [place] [address]   Culture, BAL-quantitative w Gram Stain     Status: Abnormal   Collection Time: 08/07/23 11:57 AM   Specimen: Bronchoalveolar Lavage; Respiratory  Result Value Ref Range Status   Specimen Description   Final    BRONCHIAL ALVEOLAR LAVAGE Performed at [facility], [address], [place], [place] [address]    Special Requests   Final    NONE Performed at [facility], [address] [place], [place] [address]    Gram Stain   Final  MODERATE WBC PRESENT,BOTH PMN AND MONONUCLEAR FEW YEAST Performed at [facility], [address], [place], [place] [address]    Culture 30,000  COLONIES/mL CANDIDA GLABRATA (A)  Final   Report Status 08/10/2023 FINAL  Final  Culture, BAL-quantitative w Gram Stain     Status: Abnormal (Preliminary result)   Collection Time: 08/09/23 10:36 AM   Specimen: Bronchoalveolar Lavage; Respiratory  Result Value Ref Range Status   Specimen Description   Final    BRONCHIAL ALVEOLAR LAVAGE Performed at [facility], [address], [place], [place] [address]    Special Requests   Final    NONE Performed at [facility], [address] [place], [place] [address]    Gram Stain   Final    MODERATE WBC PRESENT,BOTH PMN AND MONONUCLEAR RARE GRAM POSITIVE COCCI IN PAIRS FEW YEAST    Culture (A)  Final    80,000 COLONIES/mL CANDIDA GLABRATA CULTURE REINCUBATED FOR BETTER GROWTH Performed at [facility], [address] [place], [place] [address]    Report Status PENDING  Incomplete  Culture, blood (Routine X 2) w Reflex to ID Panel     Status: None (Preliminary result)   Collection Time: 08/10/23 11:45 AM   Specimen: BLOOD RIGHT ARM  Result Value Ref Range Status   Specimen Description BLOOD RIGHT ARM  Final   Special Requests   Final    BOTTLES DRAWN AEROBIC AND ANAEROBIC Blood Culture adequate volume   Culture   Final    NO GROWTH < 24 HOURS Performed at [facility], [address], [place], [place] [address]    Report Status PENDING  Incomplete  Culture, blood (Routine X 2) w Reflex to ID Panel     Status: None (Preliminary result)   Collection Time: 08/10/23 11:46 AM   Specimen: BLOOD LEFT HAND  Result Value Ref Range Status   Specimen Description BLOOD LEFT HAND  Final   Special Requests   Final    BOTTLES DRAWN AEROBIC AND ANAEROBIC Blood Culture adequate volume   Culture   Final    NO GROWTH < 24 HOURS Performed at [facility], [address] [place], [place] [address]    Report Status PENDING  Incomplete    Coagulation Studies: No results for input(s): LABPROT,  INR in the last 72 hours.  Urinalysis: No results for input(s): COLORURINE, LABSPEC, PHURINE, GLUCOSEU, HGBUR, BILIRUBINUR, KETONESUR, PROTEINUR, UROBILINOGEN, NITRITE, LEUKOCYTESUR in the last 72 hours.  Invalid input(s): APPERANCEUR    Imaging: MR THORACIC SPINE WO CONTRAST Result Date: 08/11/2023 CLINICAL DATA:  Initial evaluation for abnormal posture, DWI sequences only. EXAM: MRI CERVICAL, THORACIC AND LUMBAR SPINE WITHOUT CONTRAST TECHNIQUE: Multiplanar and multiecho pulse sequences of the cervical spine, to include the craniocervical junction and cervicothoracic junction, and thoracic and lumbar spine, were obtained without intravenous contrast. COMPARISON:  Prior MRI from 08/10/2023 as well as earlier studies. FINDINGS: MRI CERVICAL SPINE FINDINGS Alignment: Physiologic with preservation of the normal cervical lordosis. No interval malalignment. Vertebrae: Postoperative changes from prior posterior decompression at C3 through C6 with C2 dome laminectomy, better seen on prior exams. Vertebral body height maintained with no visible acute or interval fracture. Cord: Again seen is diffuse cord signal abnormality extending from the cervicomedullary junction through the upper cervical spinal cord. Associated cord swelling/edema. Region of signal abnormality demonstrates facilitated diffusion, with no restricted diffusion to suggest acute cord infarct. Given this, findings felt to be most consistent with diffuse spinal cord  injury related to previously identified epidural hematoma and cord compression. Posterior Fossa, vertebral arteries, paraspinal tissues: Postoperative changes within the posterior paraspinous soft tissues. No visible loculated collections on this limited exam. Disc levels: Underlying cervical spondylosis, described on recent MRI. MRI THORACIC SPINE FINDINGS Alignment: Limited diffusion-weighted images of the thoracic spine were performed. Stable alignment with  preservation of the normal thoracic kyphosis. No visible interval listhesis or malalignment. Vertebrae: Vertebral body height grossly maintained with no visible acute or interval fracture. Cord: Limited diffusion-weighted sequences of the thoracic spinal cord only were obtained. Provided images are degraded by motion and fairly limited. Previously seen diffuse cord signal abnormality extending through the majority of the thoracic cord to approximately the T11-12 level, corresponding with abnormality on prior MRI. Area demonstrates grossly facilitated diffusion throughout this region. No visible restricted diffusion to suggest spinal cord infarct. Given this, finding felt to be most consistent with acute spinal cord injury related to previously identified epidural hematoma and cord compression. Paraspinal and other soft tissues: No visible abnormality. Disc levels: Grossly stable from prior, described on previous exams. MRI LUMBAR SPINE FINDINGS Segmentation: Limited DWI sequences of the lumbar spine only were performed. Standard segmentation. Lowest well-formed disc space labeled the L5-S1 level. Alignment:  Physiologic. Vertebrae: Vertebral body height maintained without acute or interval fracture. Conus medullaris and cauda equina: Conus extends to the L1-2 level. Visualized conus and cauda equina grossly within normal limits. No restricted diffusion to suggest final cord infarct. Paraspinal and other soft tissues: No visible acute abnormality on this limited exam. Disc levels: Grossly stable, described on prior exam. IMPRESSION: 1. Diffuse cord signal abnormality extending from the cervicomedullary junction to approximately the T11-12 level, corresponding with abnormality seen on prior MRI from 08/10/2023. Region of signal abnormality demonstrates facilitated diffusion, with no visible restricted diffusion to suggest acute spinal cord infarct. Given this, these findings are felt to be most consistent with diffuse  spinal cord injury related to previously identified epidural hematoma and cord compression. 2. Postoperative changes from prior C3-C6 laminoplasty with C2 dome laminectomy, followed by subsequent epidural evacuation. Electronically Signed   By: Morene Hoard M.D.   On: 08/11/2023 03:26   MR LUMBAR SPINE WO CONTRAST Result Date: 08/11/2023 CLINICAL DATA:  Initial evaluation for abnormal posture, DWI sequences only. EXAM: MRI CERVICAL, THORACIC AND LUMBAR SPINE WITHOUT CONTRAST TECHNIQUE: Multiplanar and multiecho pulse sequences of the cervical spine, to include the craniocervical junction and cervicothoracic junction, and thoracic and lumbar spine, were obtained without intravenous contrast. COMPARISON:  Prior MRI from 08/10/2023 as well as earlier studies. FINDINGS: MRI CERVICAL SPINE FINDINGS Alignment: Physiologic with preservation of the normal cervical lordosis. No interval malalignment. Vertebrae: Postoperative changes from prior posterior decompression at C3 through C6 with C2 dome laminectomy, better seen on prior exams. Vertebral body height maintained with no visible acute or interval fracture. Cord: Again seen is diffuse cord signal abnormality extending from the cervicomedullary junction through the upper cervical spinal cord. Associated cord swelling/edema. Region of signal abnormality demonstrates facilitated diffusion, with no restricted diffusion to suggest acute cord infarct. 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 Posterior Fossa, vertebral arteries, paraspinal tissues: Postoperative changes within the posterior paraspinous soft tissues. No visible loculated collections on this limited exam. Disc levels: Underlying cervical spondylosis, described on recent MRI. MRI THORACIC SPINE FINDINGS Alignment: Limited diffusion-weighted images of the thoracic spine were performed. Stable  alignment with preservation of  the normal thoracic kyphosis. No visible interval listhesis or malalignment. Vertebrae: Vertebral body height grossly maintained with no visible acute or interval fracture. Cord: Limited diffusion-weighted sequences of the thoracic spinal cord only were obtained. Provided images are degraded by motion and fairly limited. Previously seen diffuse cord signal abnormality extending through the majority of the thoracic cord to approximately the T11-12 level, corresponding with abnormality on prior MRI. Area demonstrates grossly facilitated diffusion throughout this region. No visible restricted diffusion to suggest spinal cord infarct. Given this, finding felt to be most consistent with acute spinal cord injury related to previously identified epidural hematoma and cord compression. Paraspinal and other soft tissues: No visible abnormality. Disc levels: Grossly stable from prior, described on previous exams. MRI LUMBAR SPINE FINDINGS Segmentation: Limited DWI sequences of the lumbar spine only were performed. Standard segmentation. Lowest well-formed disc space labeled the L5-S1 level. Alignment:  Physiologic. Vertebrae: Vertebral body height maintained without acute or interval fracture. Conus medullaris and cauda equina: Conus extends to the L1-2 level. Visualized conus and cauda equina grossly within normal limits. No restricted diffusion to suggest final cord infarct. Paraspinal and other soft tissues: No visible acute abnormality on this limited exam. Disc levels: Grossly stable, described on prior exam. IMPRESSION: 1. Diffuse cord signal abnormality extending from the cervicomedullary junction to approximately the T11-12 level, corresponding with abnormality seen on prior MRI from 08/10/2023. Region of signal abnormality demonstrates facilitated diffusion, with no visible restricted diffusion to suggest acute spinal cord infarct. 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 2. Postoperative changes from prior C3-C6 laminoplasty with C2 dome laminectomy, followed by subsequent epidural evacuation. Electronically Signed   By: Morene Hoard M.D.   On: 08/11/2023 03:26   MR CERVICAL SPINE WO CONTRAST Result Date: 08/11/2023 CLINICAL DATA:  Initial evaluation for abnormal posture, DWI sequences only. EXAM: MRI CERVICAL, THORACIC AND LUMBAR SPINE WITHOUT CONTRAST TECHNIQUE: Multiplanar and multiecho pulse sequences of the cervical spine, to include the craniocervical junction and cervicothoracic junction, and thoracic and lumbar spine, were obtained without intravenous contrast. COMPARISON:  Prior MRI from 08/10/2023 as well as earlier studies. FINDINGS: MRI CERVICAL SPINE FINDINGS Alignment: Physiologic with preservation of the normal cervical lordosis. No interval malalignment. Vertebrae: Postoperative changes from prior posterior decompression at C3 through C6 with C2 dome laminectomy, better seen on prior exams. Vertebral body height maintained with no visible acute or interval fracture. Cord: Again seen is diffuse cord signal abnormality extending from the cervicomedullary junction through the upper cervical spinal cord. Associated cord swelling/edema. Region of signal abnormality demonstrates facilitated diffusion, with no restricted diffusion to suggest acute cord infarct. 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 Posterior Fossa, vertebral arteries, paraspinal tissues: Postoperative changes within the posterior paraspinous soft tissues. No visible loculated collections on this limited exam. Disc levels: Underlying cervical spondylosis, described on recent MRI. MRI THORACIC SPINE FINDINGS Alignment: Limited diffusion-weighted images of the thoracic  spine were performed. Stable alignment with preservation of the normal thoracic kyphosis. No visible interval listhesis or malalignment. Vertebrae: Vertebral body height grossly maintained with no visible acute or interval fracture. Cord: Limited diffusion-weighted sequences of the thoracic spinal cord only were obtained. Provided images are degraded by motion and fairly limited. Previously seen diffuse cord signal abnormality extending through the majority of the thoracic cord to approximately the  T11-12 level, corresponding with abnormality on prior MRI. Area demonstrates grossly facilitated diffusion throughout this region. No visible restricted diffusion to suggest spinal cord infarct. Given this, finding felt to be most consistent with acute spinal cord injury related to previously identified epidural hematoma and cord compression. Paraspinal and other soft tissues: No visible abnormality. Disc levels: Grossly stable from prior, described on previous exams. MRI LUMBAR SPINE FINDINGS Segmentation: Limited DWI sequences of the lumbar spine only were performed. Standard segmentation. Lowest well-formed disc space labeled the L5-S1 level. Alignment:  Physiologic. Vertebrae: Vertebral body height maintained without acute or interval fracture. Conus medullaris and cauda equina: Conus extends to the L1-2 level. Visualized conus and cauda equina grossly within normal limits. No restricted diffusion to suggest final cord infarct. Paraspinal and other soft tissues: No visible acute abnormality on this limited exam. Disc levels: Grossly stable, described on prior exam. IMPRESSION: 1. Diffuse cord signal abnormality extending from the cervicomedullary junction to approximately the T11-12 level, corresponding with abnormality seen on prior MRI from 08/10/2023. Region of signal abnormality demonstrates facilitated diffusion, with no visible restricted diffusion to suggest acute spinal cord infarct. 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 2. Postoperative changes from prior C3-C6 laminoplasty with C2 dome laminectomy, followed by subsequent epidural evacuation. Electronically Signed   By: Morene Hoard M.D.   On: 08/11/2023 03:26   MR BRAIN WO CONTRAST Result Date: 08/10/2023 CLINICAL DATA:  Trauma EXAM: MRI HEAD WITHOUT CONTRAST MRI CERVICAL SPINE WITHOUT CONTRAST MRI THORACIC SPINE WITHOUT CONTRAST MRI LUMBAR SPINE WITHOUT CONTRAST TECHNIQUE: Multiplanar, multiecho pulse sequences of the brain and surrounding structures, and cervical and thoracic spine and lumbar spine were obtained without intravenous contrast. COMPARISON:  Cervical spine MRI 08/01/2023, thoracic spine MRI 08/01/2023 FINDINGS: MRI HEAD FINDINGS Brain: Negative for an acute infarct. No hemorrhage. No hydrocephalus. No extra-axial fluid collection. No mass effect. No mass lesion. Cavum septum pellucidum et vergae. Background of mild chronic microvascular ischemic change. Small area of susceptibility artifact in the left insular region (series 10, image 34) is nonspecific and may represent sequela of chronic hemorrhage. Recommend short term follow up head CT to exclude the possibility of subarachnoid hemorrhage. Vascular: Normal flow voids. Skull and upper cervical spine: See below for cervical spine findings. Sinuses/Orbits: Moderate-to-large bilateral mastoid effusions. Layering secretions in the oropharynx. Mild mucosal thickening in the bilateral ethmoid sinuses. Orbits are unremarkable. Other: None. MRI CERVICAL SPINE FINDINGS Alignment: Physiologic. Vertebrae: No fracture, evidence of discitis, or bone lesion. There are postsurgical changes from posterior decompression at C3-C6. Cord: Compared to prior exam there is new T2 hyperintense cord signal abnormality spanning involving nearly the entire cervical and thoracic spinal cord.  Posterior Fossa, vertebral arteries, paraspinal tissues: There is nonspecific fluid along the laminectomy site that likely communicates with the skin surface (series 12, image 24-26). Endotracheal tube in place. Disc levels: There is a no evidence of high grade spinal canal stenosis. There has been interval evacuation of the previously seen dorsal epidural hematoma MRI THORACIC SPINE FINDINGS Alignment:  Physiologic. Vertebrae: No fracture, evidence of discitis, or bone lesion. Cord: There is T2 hyperintense signal abnormality spanning the entire thoracic spinal cord Paraspinal and other soft tissues: There is mild spinal canal narrowing of the T1-T2 level secondary to mass effect on the dorsal aspect of the thecal sac from the fluid collection at the laminectomy site. Disc levels: There is a small  residual fluid collection in the dorsal epidural space of the T6-T7 level (series 20, 20 image) measuring 4 x 6 mm. This could represent spinal residual blood products. Note that sterility of this collection is difficult to assess in the absence of IV contrast. Additional T2 hypointense material is also present in the dorsal epidural space spanning the T6-T9 levels, compatible with a residual blood products. MRI LUMBAR SPINE: Segmentation:  Standard. Alignment:  Physiologic. Vertebrae:  No fracture, evidence of discitis, or bone lesion. Conus medullaris and cauda equina: Conus extends to the L1-L2 disc space level. Conus and cauda equina appear normal. Paraspinal and other soft tissues: Redemonstrated nonspecific calcification and soft tissue in the retroperitoneum on the left. See same day CT abdomen and pelvis for additional findings Disc levels: No evidence of high-grade spinal canal or neural foraminal stenosis. IMPRESSION: MRI HEAD: 1.  Negative for an acute infarct. 2. Small area of susceptibility artifact in the left insular region is nonspecific and may represent sequela of chronic hemorrhage. Recommend short term  follow up head CT to exclude the possibility of subarachnoid hemorrhage. MRI CERVICAL AND THORACIC SPINE: 1. Interval evacuation of the previously seen dorsal epidural hematoma in the cervical and thoracic spine. No evidence of residual high grade spinal canal stenosis. There are residual blood products spanning the T6-T9 level. 2. New T2 hyperintense cord signal abnormality spanning involving nearly the entire cervical and thoracic spinal cord, which is concerning for spinal cord infarct and/or edema. DWI sequences of the cervical and thoracic spine could be obtained for further characterization. 3. Nonspecific fluid along the laminectomy site that likely communicates with the skin surface. Correlate for clinical signs of infection. MRI LUMBAR SPINE: No acute fracture or traumatic malalignment of the lumbar spine. Findings were discussed with Dr. Shellia on 08/10/23 at 4:01 PM. Electronically Signed   By: Lyndall Gore M.D.   On: 08/10/2023 16:02   MR CERVICAL SPINE WO CONTRAST Result Date: 08/10/2023 CLINICAL DATA:  Trauma EXAM: MRI HEAD WITHOUT CONTRAST MRI CERVICAL SPINE WITHOUT CONTRAST MRI THORACIC SPINE WITHOUT CONTRAST MRI LUMBAR SPINE WITHOUT CONTRAST TECHNIQUE: Multiplanar, multiecho pulse sequences of the brain and surrounding structures, and cervical and thoracic spine and lumbar spine were obtained without intravenous contrast. COMPARISON:  Cervical spine MRI 08/01/2023, thoracic spine MRI 08/01/2023 FINDINGS: MRI HEAD FINDINGS Brain: Negative for an acute infarct. No hemorrhage. No hydrocephalus. No extra-axial fluid collection. No mass effect. No mass lesion. Cavum septum pellucidum et vergae. Background of mild chronic microvascular ischemic change. Small area of susceptibility artifact in the left insular region (series 10, image 34) is nonspecific and may represent sequela of chronic hemorrhage. Recommend short term follow up head CT to exclude the possibility of subarachnoid hemorrhage. Vascular:  Normal flow voids. Skull and upper cervical spine: See below for cervical spine findings. Sinuses/Orbits: Moderate-to-large bilateral mastoid effusions. Layering secretions in the oropharynx. Mild mucosal thickening in the bilateral ethmoid sinuses. Orbits are unremarkable. Other: None. MRI CERVICAL SPINE FINDINGS Alignment: Physiologic. Vertebrae: No fracture, evidence of discitis, or bone lesion. There are postsurgical changes from posterior decompression at C3-C6. Cord: Compared to prior exam there is new T2 hyperintense cord signal abnormality spanning involving nearly the entire cervical and thoracic spinal cord. Posterior Fossa, vertebral arteries, paraspinal tissues: There is nonspecific fluid along the laminectomy site that likely communicates with the skin surface (series 12, image 24-26). Endotracheal tube in place. Disc levels: There is a no evidence of high grade spinal canal stenosis. There has been interval evacuation of  the previously seen dorsal epidural hematoma MRI THORACIC SPINE FINDINGS Alignment:  Physiologic. Vertebrae: No fracture, evidence of discitis, or bone lesion. Cord: There is T2 hyperintense signal abnormality spanning the entire thoracic spinal cord Paraspinal and other soft tissues: There is mild spinal canal narrowing of the T1-T2 level secondary to mass effect on the dorsal aspect of the thecal sac from the fluid collection at the laminectomy site. Disc levels: There is a small residual fluid collection in the dorsal epidural space of the T6-T7 level (series 20, 20 image) measuring 4 x 6 mm. This could represent spinal residual blood products. Note that sterility of this collection is difficult to assess in the absence of IV contrast. Additional T2 hypointense material is also present in the dorsal epidural space spanning the T6-T9 levels, compatible with a residual blood products. MRI LUMBAR SPINE: Segmentation:  Standard. Alignment:  Physiologic. Vertebrae:  No fracture, evidence  of discitis, or bone lesion. Conus medullaris and cauda equina: Conus extends to the L1-L2 disc space level. Conus and cauda equina appear normal. Paraspinal and other soft tissues: Redemonstrated nonspecific calcification and soft tissue in the retroperitoneum on the left. See same day CT abdomen and pelvis for additional findings Disc levels: No evidence of high-grade spinal canal or neural foraminal stenosis. IMPRESSION: MRI HEAD: 1.  Negative for an acute infarct. 2. Small area of susceptibility artifact in the left insular region is nonspecific and may represent sequela of chronic hemorrhage. Recommend short term follow up head CT to exclude the possibility of subarachnoid hemorrhage. MRI CERVICAL AND THORACIC SPINE: 1. Interval evacuation of the previously seen dorsal epidural hematoma in the cervical and thoracic spine. No evidence of residual high grade spinal canal stenosis. There are residual blood products spanning the T6-T9 level. 2. New T2 hyperintense cord signal abnormality spanning involving nearly the entire cervical and thoracic spinal cord, which is concerning for spinal cord infarct and/or edema. DWI sequences of the cervical and thoracic spine could be obtained for further characterization. 3. Nonspecific fluid along the laminectomy site that likely communicates with the skin surface. Correlate for clinical signs of infection. MRI LUMBAR SPINE: No acute fracture or traumatic malalignment of the lumbar spine. Findings were discussed with Dr. Shellia on 08/10/23 at 4:01 PM. Electronically Signed   By: Lyndall Gore M.D.   On: 08/10/2023 16:02   MR THORACIC SPINE WO CONTRAST Result Date: 08/10/2023 CLINICAL DATA:  Trauma EXAM: MRI HEAD WITHOUT CONTRAST MRI CERVICAL SPINE WITHOUT CONTRAST MRI THORACIC SPINE WITHOUT CONTRAST MRI LUMBAR SPINE WITHOUT CONTRAST TECHNIQUE: Multiplanar, multiecho pulse sequences of the brain and surrounding structures, and cervical and thoracic spine and lumbar spine were  obtained without intravenous contrast. COMPARISON:  Cervical spine MRI 08/01/2023, thoracic spine MRI 08/01/2023 FINDINGS: MRI HEAD FINDINGS Brain: Negative for an acute infarct. No hemorrhage. No hydrocephalus. No extra-axial fluid collection. No mass effect. No mass lesion. Cavum septum pellucidum et vergae. Background of mild chronic microvascular ischemic change. Small area of susceptibility artifact in the left insular region (series 10, image 34) is nonspecific and may represent sequela of chronic hemorrhage. Recommend short term follow up head CT to exclude the possibility of subarachnoid hemorrhage. Vascular: Normal flow voids. Skull and upper cervical spine: See below for cervical spine findings. Sinuses/Orbits: Moderate-to-large bilateral mastoid effusions. Layering secretions in the oropharynx. Mild mucosal thickening in the bilateral ethmoid sinuses. Orbits are unremarkable. Other: None. MRI CERVICAL SPINE FINDINGS Alignment: Physiologic. Vertebrae: No fracture, evidence of discitis, or bone lesion. There are postsurgical changes  from posterior decompression at C3-C6. Cord: Compared to prior exam there is new T2 hyperintense cord signal abnormality spanning involving nearly the entire cervical and thoracic spinal cord. Posterior Fossa, vertebral arteries, paraspinal tissues: There is nonspecific fluid along the laminectomy site that likely communicates with the skin surface (series 12, image 24-26). Endotracheal tube in place. Disc levels: There is a no evidence of high grade spinal canal stenosis. There has been interval evacuation of the previously seen dorsal epidural hematoma MRI THORACIC SPINE FINDINGS Alignment:  Physiologic. Vertebrae: No fracture, evidence of discitis, or bone lesion. Cord: There is T2 hyperintense signal abnormality spanning the entire thoracic spinal cord Paraspinal and other soft tissues: There is mild spinal canal narrowing of the T1-T2 level secondary to mass effect on the  dorsal aspect of the thecal sac from the fluid collection at the laminectomy site. Disc levels: There is a small residual fluid collection in the dorsal epidural space of the T6-T7 level (series 20, 20 image) measuring 4 x 6 mm. This could represent spinal residual blood products. Note that sterility of this collection is difficult to assess in the absence of IV contrast. Additional T2 hypointense material is also present in the dorsal epidural space spanning the T6-T9 levels, compatible with a residual blood products. MRI LUMBAR SPINE: Segmentation:  Standard. Alignment:  Physiologic. Vertebrae:  No fracture, evidence of discitis, or bone lesion. Conus medullaris and cauda equina: Conus extends to the L1-L2 disc space level. Conus and cauda equina appear normal. Paraspinal and other soft tissues: Redemonstrated nonspecific calcification and soft tissue in the retroperitoneum on the left. See same day CT abdomen and pelvis for additional findings Disc levels: No evidence of high-grade spinal canal or neural foraminal stenosis. IMPRESSION: MRI HEAD: 1.  Negative for an acute infarct. 2. Small area of susceptibility artifact in the left insular region is nonspecific and may represent sequela of chronic hemorrhage. Recommend short term follow up head CT to exclude the possibility of subarachnoid hemorrhage. MRI CERVICAL AND THORACIC SPINE: 1. Interval evacuation of the previously seen dorsal epidural hematoma in the cervical and thoracic spine. No evidence of residual high grade spinal canal stenosis. There are residual blood products spanning the T6-T9 level. 2. New T2 hyperintense cord signal abnormality spanning involving nearly the entire cervical and thoracic spinal cord, which is concerning for spinal cord infarct and/or edema. DWI sequences of the cervical and thoracic spine could be obtained for further characterization. 3. Nonspecific fluid along the laminectomy site that likely communicates with the skin  surface. Correlate for clinical signs of infection. MRI LUMBAR SPINE: No acute fracture or traumatic malalignment of the lumbar spine. Findings were discussed with Dr. Shellia on 08/10/23 at 4:01 PM. Electronically Signed   By: Lyndall Gore M.D.   On: 08/10/2023 16:02   MR LUMBAR SPINE WO CONTRAST Result Date: 08/10/2023 CLINICAL DATA:  Trauma EXAM: MRI HEAD WITHOUT CONTRAST MRI CERVICAL SPINE WITHOUT CONTRAST MRI THORACIC SPINE WITHOUT CONTRAST MRI LUMBAR SPINE WITHOUT CONTRAST TECHNIQUE: Multiplanar, multiecho pulse sequences of the brain and surrounding structures, and cervical and thoracic spine and lumbar spine were obtained without intravenous contrast. COMPARISON:  Cervical spine MRI 08/01/2023, thoracic spine MRI 08/01/2023 FINDINGS: MRI HEAD FINDINGS Brain: Negative for an acute infarct. No hemorrhage. No hydrocephalus. No extra-axial fluid collection. No mass effect. No mass lesion. Cavum septum pellucidum et vergae. Background of mild chronic microvascular ischemic change. Small area of susceptibility artifact in the left insular region (series 10, image 34) is nonspecific and may  represent sequela of chronic hemorrhage. Recommend short term follow up head CT to exclude the possibility of subarachnoid hemorrhage. Vascular: Normal flow voids. Skull and upper cervical spine: See below for cervical spine findings. Sinuses/Orbits: Moderate-to-large bilateral mastoid effusions. Layering secretions in the oropharynx. Mild mucosal thickening in the bilateral ethmoid sinuses. Orbits are unremarkable. Other: None. MRI CERVICAL SPINE FINDINGS Alignment: Physiologic. Vertebrae: No fracture, evidence of discitis, or bone lesion. There are postsurgical changes from posterior decompression at C3-C6. Cord: Compared to prior exam there is new T2 hyperintense cord signal abnormality spanning involving nearly the entire cervical and thoracic spinal cord. Posterior Fossa, vertebral arteries, paraspinal tissues: There is  nonspecific fluid along the laminectomy site that likely communicates with the skin surface (series 12, image 24-26). Endotracheal tube in place. Disc levels: There is a no evidence of high grade spinal canal stenosis. There has been interval evacuation of the previously seen dorsal epidural hematoma MRI THORACIC SPINE FINDINGS Alignment:  Physiologic. Vertebrae: No fracture, evidence of discitis, or bone lesion. Cord: There is T2 hyperintense signal abnormality spanning the entire thoracic spinal cord Paraspinal and other soft tissues: There is mild spinal canal narrowing of the T1-T2 level secondary to mass effect on the dorsal aspect of the thecal sac from the fluid collection at the laminectomy site. Disc levels: There is a small residual fluid collection in the dorsal epidural space of the T6-T7 level (series 20, 20 image) measuring 4 x 6 mm. This could represent spinal residual blood products. Note that sterility of this collection is difficult to assess in the absence of IV contrast. Additional T2 hypointense material is also present in the dorsal epidural space spanning the T6-T9 levels, compatible with a residual blood products. MRI LUMBAR SPINE: Segmentation:  Standard. Alignment:  Physiologic. Vertebrae:  No fracture, evidence of discitis, or bone lesion. Conus medullaris and cauda equina: Conus extends to the L1-L2 disc space level. Conus and cauda equina appear normal. Paraspinal and other soft tissues: Redemonstrated nonspecific calcification and soft tissue in the retroperitoneum on the left. See same day CT abdomen and pelvis for additional findings Disc levels: No evidence of high-grade spinal canal or neural foraminal stenosis. IMPRESSION: MRI HEAD: 1.  Negative for an acute infarct. 2. Small area of susceptibility artifact in the left insular region is nonspecific and may represent sequela of chronic hemorrhage. Recommend short term follow up head CT to exclude the possibility of subarachnoid  hemorrhage. MRI CERVICAL AND THORACIC SPINE: 1. Interval evacuation of the previously seen dorsal epidural hematoma in the cervical and thoracic spine. No evidence of residual high grade spinal canal stenosis. There are residual blood products spanning the T6-T9 level. 2. New T2 hyperintense cord signal abnormality spanning involving nearly the entire cervical and thoracic spinal cord, which is concerning for spinal cord infarct and/or edema. DWI sequences of the cervical and thoracic spine could be obtained for further characterization. 3. Nonspecific fluid along the laminectomy site that likely communicates with the skin surface. Correlate for clinical signs of infection. MRI LUMBAR SPINE: No acute fracture or traumatic malalignment of the lumbar spine. Findings were discussed with Dr. Shellia on 08/10/23 at 4:01 PM. Electronically Signed   By: Lyndall Gore M.D.   On: 08/10/2023 16:02   CT ABDOMEN PELVIS WO CONTRAST Result Date: 08/10/2023 CLINICAL DATA:  Sepsis. Additional valuation gastric and abdominal anatomy for possible percutaneous gastrostomy tube placement. EXAM: CT ABDOMEN AND PELVIS WITHOUT CONTRAST TECHNIQUE: Multidetector CT imaging of the abdomen and pelvis was performed following the  standard protocol without IV contrast. RADIATION DOSE REDUCTION: This exam was performed according to the departmental dose-optimization program which includes automated exposure control, adjustment of the mA and/or kV according to patient size and/or use of iterative reconstruction technique. COMPARISON:  CT of the chest on 07/31/2023 FINDINGS: Lower chest: Residual atelectasis/consolidation of the posterior left lower lobe. Trace left pleural fluid. Hepatobiliary: Liver unremarkable by unenhanced CT with normal variant extension of the left lobe all the way across the midline well into the left upper quadrant and left lateral abdomen. The gallbladder is nondistended and contains a few small visible hyperdense calculi.  No biliary ductal dilatation. Pancreas: Unremarkable. No pancreatic ductal dilatation or surrounding inflammatory changes. Spleen: Normal in size without focal abnormality. Adrenals/Urinary Tract: No adrenal masses. Unremarkable appearance of right kidney and bladder. The left kidney is severely atrophic and calcified. Stomach/Bowel: No hiatal hernia. The stomach is positioned entirely posterior to the elongated left lobe of the liver and posteriorly displaced. Anatomy is not amenable to attempted percutaneous gastrostomy tube placement. No bowel obstruction or ileus identified. No free intraperitoneal air. Irregular wall thickening is noted in the proximal ascending colon within the pelvis with some associated dilatation of the cecum. Over a segment of approximately 4.3 cm irregular thickening approaches up to 1.7 cm in thickness. Although some of this may be attributable to peristalsis, findings are suspicious for a lesion by CT and a carcinoma in this region cannot be excluded. Vascular/Lymphatic: Mild atherosclerosis of the abdominal aorta without aneurysm. No enlarged lymph nodes identified. Reproductive: Status post hysterectomy. No adnexal masses. Other: Trace free fluid in the pelvis. Subcutaneous edema along the posterolateral aspect of the right hip extending into the visualized proximal thigh and gluteal region without overt skin breakdown or subcutaneous gas. Regional cellulitis is not excluded and correlation suggested clinically. Musculoskeletal: No acute or significant osseous findings. IMPRESSION: 1. Irregular wall thickening in the proximal ascending colon within the pelvis with some associated dilatation of the cecum. Although some of this may be attributable to peristalsis, findings are suspicious for a possible colon carcinoma. Consider correlation with colonoscopy if the patient is able to tolerate colonoscopy in the future. 2. The stomach is positioned entirely posterior to the elongated left  lobe of the liver and posteriorly displaced. Anatomy is not amenable to attempted percutaneous gastrostomy tube placement. Consider general surgical evaluation if the patient cannot maintain nutritional needs. 3. Residual atelectasis/consolidation of the posterior left lower lobe with trace left pleural fluid. 4. Cholelithiasis without evidence of acute cholecystitis. 5. Severely atrophic and calcified left kidney. 6. Subcutaneous edema along the posterolateral aspect of the right hip extending into the visualized proximal thigh and gluteal region without overt skin breakdown or subcutaneous gas. Regional cellulitis is not excluded and correlation suggested clinically. 7. Aortic atherosclerosis. Electronically Signed   By: Marcey Moan M.D.   On: 08/10/2023 14:30   CT HEAD WO CONTRAST ( ) Result Date: 08/10/2023 CLINICAL DATA:  Acute cervical myelopathy. Neuro deficit with stroke suspected EXAM: CT HEAD WITHOUT CONTRAST CT CERVICAL SPINE WITHOUT CONTRAST TECHNIQUE: Multidetector CT imaging of the head and cervical spine was performed following the standard protocol without intravenous contrast. Multiplanar CT image reconstructions of the cervical spine were also generated. RADIATION DOSE REDUCTION: This exam was performed according to the departmental dose-optimization program which includes automated exposure control, adjustment of the mA and/or kV according to patient size and/or use of iterative reconstruction technique. COMPARISON:  08/01/2023 FINDINGS: CT HEAD FINDINGS Brain: No evidence of acute infarction,  hemorrhage, hydrocephalus, extra-axial collection or mass lesion/mass effect. Vascular: No hyperdense vessel or unexpected calcification. Skull: Normal. Negative for fracture or focal lesion. Sinuses/Orbits: No acute finding CT CERVICAL SPINE FINDINGS Alignment: Normal. Skull base and vertebrae: Interval posterior decompression with removal of laminoplasty at C2-3 to C6-7. No evidence of fracture or  erosion. Soft tissues and spinal canal: No visible remaining epidural hemorrhage. Expected postoperative soft tissue swelling in the operative region. Disc levels:  Generalized degenerative endplate and facet spurring. Upper chest: Clear apical lungs IMPRESSION: Head CT: Stable and negative. Cervical spine CT: Interval epidural decompression with laminoplasty removal. No unexpected finding. Electronically Signed   By: Dorn Roulette M.D.   On: 08/10/2023 04:56   CT CERVICAL SPINE WO CONTRAST Result Date: 08/10/2023 CLINICAL DATA:  Acute cervical myelopathy. Neuro deficit with stroke suspected EXAM: CT HEAD WITHOUT CONTRAST CT CERVICAL SPINE WITHOUT CONTRAST TECHNIQUE: Multidetector CT imaging of the head and cervical spine was performed following the standard protocol without intravenous contrast. Multiplanar CT image reconstructions of the cervical spine were also generated. RADIATION DOSE REDUCTION: This exam was performed according to the departmental dose-optimization program which includes automated exposure control, adjustment of the mA and/or kV according to patient size and/or use of iterative reconstruction technique. COMPARISON:  08/01/2023 FINDINGS: CT HEAD FINDINGS Brain: No evidence of acute infarction, hemorrhage, hydrocephalus, extra-axial collection or mass lesion/mass effect. Vascular: No hyperdense vessel or unexpected calcification. Skull: Normal. Negative for fracture or focal lesion. Sinuses/Orbits: No acute finding CT CERVICAL SPINE FINDINGS Alignment: Normal. Skull base and vertebrae: Interval posterior decompression with removal of laminoplasty at C2-3 to C6-7. No evidence of fracture or erosion. Soft tissues and spinal canal: No visible remaining epidural hemorrhage. Expected postoperative soft tissue swelling in the operative region. Disc levels:  Generalized degenerative endplate and facet spurring. Upper chest: Clear apical lungs IMPRESSION: Head CT: Stable and negative. Cervical spine  CT: Interval epidural decompression with laminoplasty removal. No unexpected finding. Electronically Signed   By: Dorn Roulette M.D.   On: 08/10/2023 04:56     Assessment & Plan: Pt is a 68 y.o. female with a PMHx of acute incomplete quadriplegia, history of easy bleeding, central cord syndrome, diabetes mellitus type 2, diverticulosis, GERD, hypothyroidism, peripheral neuropathy, who was admitted to [facility] on 07/28/2023 for C2 dome laminectomy, C3-6 laminoplasty with postoperative bleeding and epidural hematoma causing spinal cord compression s/p evacuation 08/01/2023 who we are asked to see now for acute kidney injury.  1.  Acute kidney injury/chronic kidney disease stage II baseline creatinine 0.75.  Suspect acute kidney injury now related to relative hypotension over the past several days.  Patient is still producing urine.  Continue supportive care for now.  No immediate need for dialysis at the moment.  2.  Acute metabolic acidosis.  Start the patient on sodium bicarbonate  infusion at 75 cc/h.  3.  Acute respiratory failure.  Pulmonary/critical care following on the case.  Weaning as per pulmonary/critical care.

## 2023-08-11 NOTE — IPAL (Signed)
  Interdisciplinary Goals of Care Family Meeting   Date carried out: 08/11/2023  Location of the meeting: Conference room  Member's involved: Physician and Family Member or next of kin  Durable Power of Attorney or acting medical decision maker: Husband,   Lysandra Loughmiller. Also present were daughter Alan Quill, brother, son, and son-in-law.  Discussion: We discussed goals of care for Arnetha Gin .  Explained the findings on the MRI, and Mrs. Krawczyk' current clinical condition with edema likely secondary to compression from the hematoma. Explained that no infarct noted on the MRI images. Answered questions regarding prognosis to the best of my ability. I relayed to the family that any path towards recovery would likely involve placement of a tracheostomy tube, PEG tube (placed today) and placement at [facility] to allow for time for the swelling to decrease and allow for recovery. Presence of a respiratory drive and ability to SBT are positive indications of phrenic nerve function, but it is difficult to ascertain whether there will be long term motor or sensory deficits. Family understanding of overall difficult clinical condition she's in. We will continue our conversations as her clinical condition evolves.  Code status:   Code Status: Full Code   Disposition: Continue current acute care  Time spent for the meeting: 25 minutes    Belva November, MD  08/11/2023, 7:09 PM

## 2023-08-12 ENCOUNTER — Encounter: Payer: Self-pay | Admitting: Surgery

## 2023-08-12 ENCOUNTER — Inpatient Hospital Stay: Payer: Medicare Other

## 2023-08-12 DIAGNOSIS — G928 Other toxic encephalopathy: Secondary | ICD-10-CM

## 2023-08-12 DIAGNOSIS — N179 Acute kidney failure, unspecified: Secondary | ICD-10-CM | POA: Diagnosis not present

## 2023-08-12 DIAGNOSIS — J9600 Acute respiratory failure, unspecified whether with hypoxia or hypercapnia: Secondary | ICD-10-CM

## 2023-08-12 DIAGNOSIS — R569 Unspecified convulsions: Secondary | ICD-10-CM

## 2023-08-12 DIAGNOSIS — M4804 Spinal stenosis, thoracic region: Secondary | ICD-10-CM | POA: Diagnosis not present

## 2023-08-12 DIAGNOSIS — G959 Disease of spinal cord, unspecified: Secondary | ICD-10-CM | POA: Diagnosis not present

## 2023-08-12 DIAGNOSIS — Z7189 Other specified counseling: Secondary | ICD-10-CM | POA: Diagnosis not present

## 2023-08-12 LAB — CBC WITH DIFFERENTIAL/PLATELET
Abs Immature Granulocytes: 0.18 10*3/uL — ABNORMAL HIGH (ref 0.00–0.07)
Basophils Absolute: 0 10*3/uL (ref 0.0–0.1)
Basophils Relative: 0 %
Eosinophils Absolute: 0.1 10*3/uL (ref 0.0–0.5)
Eosinophils Relative: 1 %
HCT: 23.6 % — ABNORMAL LOW (ref 36.0–46.0)
Hemoglobin: 8.2 g/dL — ABNORMAL LOW (ref 12.0–15.0)
Immature Granulocytes: 1 %
Lymphocytes Relative: 13 %
Lymphs Abs: 1.8 10*3/uL (ref 0.7–4.0)
MCH: 30.4 pg (ref 26.0–34.0)
MCHC: 34.7 g/dL (ref 30.0–36.0)
MCV: 87.4 fL (ref 80.0–100.0)
Monocytes Absolute: 1.2 10*3/uL — ABNORMAL HIGH (ref 0.1–1.0)
Monocytes Relative: 8 %
Neutro Abs: 10.8 10*3/uL — ABNORMAL HIGH (ref 1.7–7.7)
Neutrophils Relative %: 77 %
Platelets: 119 10*3/uL — ABNORMAL LOW (ref 150–400)
RBC: 2.7 MIL/uL — ABNORMAL LOW (ref 3.87–5.11)
RDW: 14.6 % (ref 11.5–15.5)
Smear Review: NORMAL
WBC: 14.1 10*3/uL — ABNORMAL HIGH (ref 4.0–10.5)
nRBC: 0.4 % — ABNORMAL HIGH (ref 0.0–0.2)

## 2023-08-12 LAB — CBC
HCT: 16.1 % — ABNORMAL LOW (ref 36.0–46.0)
Hemoglobin: 5.5 g/dL — ABNORMAL LOW (ref 12.0–15.0)
MCH: 30.4 pg (ref 26.0–34.0)
MCHC: 34.2 g/dL (ref 30.0–36.0)
MCV: 89 fL (ref 80.0–100.0)
Platelets: 151 10*3/uL (ref 150–400)
RBC: 1.81 MIL/uL — ABNORMAL LOW (ref 3.87–5.11)
RDW: 15.4 % (ref 11.5–15.5)
WBC: 15.5 10*3/uL — ABNORMAL HIGH (ref 4.0–10.5)
nRBC: 0.5 % — ABNORMAL HIGH (ref 0.0–0.2)

## 2023-08-12 LAB — HEMOGLOBIN AND HEMATOCRIT, BLOOD
HCT: 15.9 % — ABNORMAL LOW (ref 36.0–46.0)
Hemoglobin: 5.4 g/dL — ABNORMAL LOW (ref 12.0–15.0)

## 2023-08-12 LAB — CULTURE, BAL-QUANTITATIVE W GRAM STAIN

## 2023-08-12 LAB — RENAL FUNCTION PANEL
Albumin: 2.6 g/dL — ABNORMAL LOW (ref 3.5–5.0)
Anion gap: 10 (ref 5–15)
BUN: 105 mg/dL — ABNORMAL HIGH (ref 8–23)
CO2: 16 mmol/L — ABNORMAL LOW (ref 22–32)
Calcium: 8.4 mg/dL — ABNORMAL LOW (ref 8.9–10.3)
Chloride: 114 mmol/L — ABNORMAL HIGH (ref 98–111)
Creatinine, Ser: 2.05 mg/dL — ABNORMAL HIGH (ref 0.44–1.00)
GFR, Estimated: 26 mL/min — ABNORMAL LOW (ref >60)
Glucose, Bld: 253 mg/dL — ABNORMAL HIGH (ref 70–99)
Phosphorus: 3.5 mg/dL (ref 2.5–4.6)
Potassium: 4.6 mmol/L (ref 3.5–5.1)
Sodium: 140 mmol/L (ref 135–145)

## 2023-08-12 LAB — GLUCOSE, CAPILLARY
Glucose-Capillary: 144 mg/dL — ABNORMAL HIGH (ref 70–99)
Glucose-Capillary: 163 mg/dL — ABNORMAL HIGH (ref 70–99)
Glucose-Capillary: 199 mg/dL — ABNORMAL HIGH (ref 70–99)
Glucose-Capillary: 203 mg/dL — ABNORMAL HIGH (ref 70–99)
Glucose-Capillary: 42 mg/dL — CL (ref 70–99)
Glucose-Capillary: 64 mg/dL — ABNORMAL LOW (ref 70–99)
Glucose-Capillary: 72 mg/dL (ref 70–99)
Glucose-Capillary: 76 mg/dL (ref 70–99)

## 2023-08-12 LAB — MAGNESIUM: Magnesium: 2.3 mg/dL (ref 1.7–2.4)

## 2023-08-12 LAB — VANCOMYCIN, RANDOM: Vancomycin Rm: 12 ug/mL

## 2023-08-12 LAB — PREPARE RBC (CROSSMATCH)

## 2023-08-12 LAB — OCCULT BLOOD X 1 CARD TO LAB, STOOL: Fecal Occult Bld: POSITIVE — AB

## 2023-08-12 MED ORDER — SODIUM CHLORIDE 0.9 % IV SOLN
20.0000 ug | Freq: Once | INTRAVENOUS | Status: AC
  Administered 2023-08-12: 20 ug via INTRAVENOUS
  Filled 2023-08-12: qty 5

## 2023-08-12 MED ORDER — DEXTROSE 50 % IV SOLN
INTRAVENOUS | Status: AC
  Administered 2023-08-12: 12.5 g via INTRAVENOUS
  Filled 2023-08-12: qty 50

## 2023-08-12 MED ORDER — PANTOPRAZOLE SODIUM 40 MG IV SOLR
40.0000 mg | Freq: Two times a day (BID) | INTRAVENOUS | Status: DC
  Administered 2023-08-12 – 2023-08-21 (×18): 40 mg via INTRAVENOUS
  Filled 2023-08-12 (×18): qty 10

## 2023-08-12 MED ORDER — DEXTROSE 50 % IV SOLN: 12.5000 g | INTRAVENOUS | Status: AC

## 2023-08-12 MED ORDER — FREE WATER
30.0000 mL | Status: DC
  Administered 2023-08-12 – 2023-08-14 (×10): 30 mL

## 2023-08-12 MED ORDER — OSMOLITE 1.5 CAL PO LIQD
1000.0000 mL | ORAL | Status: DC
  Administered 2023-08-12 – 2023-08-19 (×6): 1000 mL

## 2023-08-12 MED ORDER — DEXTROSE 50 % IV SOLN: 25.0000 g | INTRAVENOUS | Status: AC

## 2023-08-12 MED ORDER — SODIUM CHLORIDE 0.9% IV SOLUTION: Freq: Once | INTRAVENOUS | Status: DC

## 2023-08-12 MED ORDER — PROSOURCE TF20 ENFIT COMPATIBL EN LIQD
60.0000 mL | Freq: Every day | ENTERAL | Status: DC
  Administered 2023-08-13 – 2023-08-21 (×9): 60 mL
  Filled 2023-08-12 (×5): qty 60

## 2023-08-12 MED ORDER — DEXTROSE 50 % IV SOLN
INTRAVENOUS | Status: AC
  Administered 2023-08-12: 50 mL via INTRAVENOUS
  Filled 2023-08-12: qty 50

## 2023-08-12 NOTE — Inpatient Diabetes Management (Addendum)
 Inpatient Diabetes Program Recommendations  AACE/ADA: New Consensus Statement on Inpatient Glycemic Control   Target Ranges:  Prepandial:   less than 140 mg/dL      Peak postprandial:   less than 180 mg/dL (1-2 hours)      Critically ill patients:  140 - 180 mg/dL    Latest Reference Range & Units 08/12/23 03:34 08/12/23 07:26  Glucose-Capillary 70 - 99 mg/dL 800 (H) 796 (H)    Latest Reference Range & Units 08/11/23 07:30 08/11/23 11:17 08/11/23 16:06 08/11/23 20:01 08/11/23 23:34  Glucose-Capillary 70 - 99 mg/dL 763 (H) 795 (H) 778 (H) 190 (H) 174 (H)   Review of Glycemic Control  Current orders for Inpatient glycemic control: Semglee  5 units daily, Novolog  3 units Q4H, Novolog  0-20 units Q4H; TPN @ 84 ml/hr with 20 units of insulin   Inpatient Diabetes Program Recommendations:    Insulin  in TPN: If TPN rate stays the same, may want to consider increasing insulin  in TPN to 30 units.  Thanks, Earnie Gainer, RN, MSN, CDCES Diabetes Coordinator Inpatient Diabetes Program [phone] (Team Pager from 8am to 5pm)

## 2023-08-12 NOTE — Progress Notes (Signed)
 Daily Progress Note   Patient Name: [name]       Date: 08/12/2023 DOB: 07-31-1955  Age: 68 y.o. MRN#: [id number] Attending Physician: [name] ORN, MD Primary Care Physician: Valora Agent, MD Admit Date: 07/28/2023  Reason for Consultation/Follow-up: Establishing goals of care  Subjective: Notes and labs reviewed.  Patient's husband and son are at bedside.  They discussed the updates that they have received, and understand patient's potentially very poor prognosis.  We discussed the importance of considering acceptable quality of life, and time frames with decision making.  Time for outcomes.  PMT will follow.  Length of Stay: 15  Current Medications: Scheduled Meds:  . sodium chloride    Intravenous Once  . Chlorhexidine  Gluconate Cloth  6 each Topical Daily  . docusate  100 mg Per Tube BID  . insulin  aspart  0-20 Units Subcutaneous Q4H  . insulin  aspart  3 Units Subcutaneous Q4H  . insulin  glargine-yfgn  5 Units Subcutaneous Daily  . levothyroxine   88 mcg Per Tube QAC breakfast  . mouth rinse  15 mL Mouth Rinse Q2H  . pantoprazole  (PROTONIX ) IV  40 mg Intravenous Q12H  . polyethylene glycol  17 g Per Tube Daily  . sodium chloride  flush  10-40 mL Intracatheter Q12H  . sodium chloride  flush  3-10 mL Intravenous Q12H    Continuous Infusions: . sodium chloride  Stopped (08/11/23 1717)  . albumin  human 60 mL/hr at 08/12/23 0917  . cefTAZidime  (FORTAZ )  IV Stopped (08/12/23 0415)  . dexmedetomidine  (PRECEDEX ) IV infusion Stopped (08/12/23 0818)  . phenylephrine  (NEO-SYNEPHRINE) Adult infusion 60 mcg/min (08/12/23 1501)  . sodium bicarbonate  150 mEq in sterile water  1,150 mL infusion 75 mL/hr at 08/12/23 1501  . vasopressin  0.03 Units/min (08/12/23 1614)    PRN  Meds: sodium chloride , bisacodyl , fentaNYL  (SUBLIMAZE ) injection, fentaNYL  (SUBLIMAZE ) injection, menthol -cetylpyridinium **OR** [DISCONTINUED] phenol, mouth rinse, sodium chloride  flush, sodium chloride  flush  Physical Exam Constitutional:      Comments: Eyes closed.  On ventilator             Vital Signs: BP (!) 151/57 (BP Location: Left Arm)   Pulse 66   Temp 97.9 F (36.6 C) (Axillary)   Resp 17   Ht 5' 7 (1.702 m)   Wt 64.1 kg   SpO2 100%   BMI 22.13 kg/m  SpO2: SpO2: 100 % O2 Device: O2 Device: Ventilator O2 Flow Rate: O2 Flow Rate (L/min): 45 L/min  Intake/output summary:  Intake/Output Summary (Last 24 hours) at 08/12/2023 1618 Last data filed at 08/12/2023 1501 Gross per 24 hour  Intake 4102.9 ml  Output 1970 ml  Net 2132.9 ml   LBM: Last BM Date : 08/11/23 Baseline Weight: Weight: 58.9 kg Most recent weight: Weight: 64.1 kg   Patient Active Problem List   Diagnosis Date Noted  . Myelopathy of thoracic region 08/04/2023  . Hematoma 08/04/2023  . Spinal stenosis, thoracic 08/04/2023  . Malnutrition of moderate degree 08/03/2023  . Lobar pneumonia (HCC) 08/01/2023  . MRSA (methicillin resistant staph aureus) culture positive 08/01/2023  . Cervical myelopathy (HCC) 07/28/2023  . Spinal stenosis in cervical region 06/18/2023  . Spinal cord injury at C5-C7 level without injury of spinal bone (HCC) 06/18/2023  . Arm weakness 06/18/2023  . Numbness and tingling 06/18/2023  . Spasticity 06/15/2023  . Orthostatic hypotension 06/15/2023  . Esophageal stricture 05/01/2023  . Dysphagia 04/30/2023  . Odynophagia 04/30/2023  . Gastroesophageal reflux disease 04/30/2023  . Acute incomplete quadriplegia (HCC) 04/23/2023  . Spinal cord injury, cervical region (HCC) 04/22/2023  . Spinal cord compression, post-traumatic (HCC) 04/13/2023  . Central cord syndrome (HCC) 04/13/2023  . Muscle weakness of right upper extremity 04/13/2023  . Weakness of right lower extremity  04/13/2023  . Pelvic fracture (HCC) 04/13/2023  . Cellulitis 08/12/2021  . Facial cellulitis 08/11/2021  . Dental caries 08/11/2021  . COVID-19 virus infection 08/11/2021  . Diabetes mellitus without complication (HCC)   . Hypothyroidism   . Stage 3a chronic kidney disease (HCC)   . Non-dose-related adverse reaction to medication   . Post-operative state 03/16/2017  . AKI (acute kidney injury) (HCC) 01/18/2017  . Acute respiratory failure with hypoxia (HCC) 11/04/2015    Palliative Care Assessment & Plan   Recommendations/Plan: Time for outcomes.  Code Status:    Code Status Orders  (From admission, onward)           Start     Ordered   07/28/23 1459  Full code  Continuous       Question:  By:  Answer:  Procedural case: previous code status reviewed   07/28/23 1458           Code Status History     Date Active Date Inactive Code Status Order ID Comments User Context   04/22/2023 1239 05/12/2023 1531 Full Code [id number]  Rosendo Nena PARAS, PA-C Inpatient   04/22/2023 1239 04/22/2023 1239 Full Code [id number]  Rosendo Nena PARAS, PA-C Inpatient   04/13/2023 1640 04/22/2023 1237 Full Code [id number]  Marca Maude POUR, MD ED   04/13/2023 1629 04/13/2023 1640 Full Code [id number]  Isaiah Scrivener, MD ED   08/11/2021 2222 08/16/2021 1950 Full Code [id number]  Cleatus Delayne GAILS, MD ED   03/16/2017 1059 03/17/2017 1935 Full Code [id number]  Schermerhorn, Debby PARAS, MD Inpatient   01/18/2017 1745 01/19/2017 2033 Full Code [id number]  Tobie Calix, MD ED   11/04/2015 1219 11/06/2015 2047 Full Code [id number]  Almeda Bernard, MD ED      Care plan was discussed with RN  Thank you for allowing the Palliative Medicine Team to assist in the care of this patient.   Camelia Lewis, NP  Please contact Palliative Medicine Team phone at [phone] for questions and concerns.

## 2023-08-12 NOTE — Progress Notes (Signed)
 Eeg done

## 2023-08-12 NOTE — Progress Notes (Signed)
 NAME:  [name], MRN:  [id number], DOB:  Jan 18, 1956, LOS: 15 ADMISSION DATE:  07/28/2023, CHIEF COMPLAINT:  Cervical Myelopathy   History of Present Illness:   [name] is a 68 year old old female with PMHx of hypothyroidism, insulin -dependent DM, CKD stage III, dysphagia with esophageal strictures, facial cellulitis, GERD, neuropathy, arthritis, blood dyscrasia, and recent traumatic cervical central cord injury who presented to the hospital for elective cervical spine surgery.   Per patient's chart, patient was seen by neurosurgery on 07/28/2023 for follow-up after suffering a fall 3 months ago in which she sustained a cervical spinal cord injury.  Per patient, they deferred emergent surgical depression and wanted to let her have time to recover and work with physical therapy.  She continued to have hand weakness as well as lower extremity weakness and ambulatory issues.  Given the ongoing compression of C3-C6, laminoplasty and C2 dome laminectomy was indicated.     Hospital course: Patient underwent C3-6 laminoplasty and C2 dome and laminectomy surgery on 07/29/2023 with drain placement.   Pertinent Labs/Diagnostics Findings: Na+/ K+: 141/4.4 Glucose: 136 BUN/Cr.47/1.42 WBC: 5.8 K/L  Hgb/Hct: 7.4/22.4  Pertinent  Medical History  Hypothyroidism, insulin -dependent DM, CKD stage III, dysphagia with esophageal strictures, facial cellulitis, GERD, neuropathy, arthritis, blood dyscrasia, and recent traumatic cervical central cord injury  Significant Hospital Events: Including procedures, antibiotic start and stop dates in addition to other pertinent events   01/21: Patient underwent posterior C3-6 Cervical Laminoplasty with C2 inferior dome Laminectomy 01/22: POD #1. Pt. stable with no acute changes.  Working with PT and OT 01/23: POD #2.  Pt. noted to be very lethargic and difficult to arouse 01/24: POD #3. Pt. developed hypoxia tachycardia and shortness of breath.  Hospitalist  consulted CT chest obtained and showed possible pneumonia.  Started on IV antibiotics and DuoNebs. 01/25: POD #4. Pt. with new concerns of not moving the left arm.  CT head and cervical spine obtained with no new concerns.  Follow-up MRI however demonstrated epidural hematoma extending from the cervical spine to the lower thoracic spine with acute compression and T2 signal.  Taken emergently to the OR for surgical decompression 01/26: Pt. transferred to the ICU s/p emergent cervical decompression and evacuation of epidural hematoma.  Remained intubated. PCCM consulted 08/03/23- s/p surgery overnight, alert to self but drowsy with mild AKI and metabolic acidosis.  Lethargy noted have dcd cefepime  and started doxycycline  instead.  08/04/23- patient more alert able to wiggle toes to verbal.  Remains critically ill.  08/05/23- patient appears to have hypoactive delerium with waxing and waning sensorium.  At times she asks for things other times she's poorly responsive.  Her oxygenation has improved with nasal canula 5L/min.  S/p heme/onc evaluation today 08/06/23- patient seen at bedside, she was able to whisper few words tome.  I discussed PO intake with speech pathologist and SLP with barium was ordered however patient unable to complete this study. She is still very weak physically barely able to wiggle toes.  H/h <7 s/p prbc.  Renal function is improved.  08/07/23- patient had worsening hypoxemia with CXR showing worsening complete left opacification. I discussed this with neurosurgery and they stated her cervical spine did not have any new hardware so intubation should not be complicated. I met with PA Edsel Goods at bedside  to review care plan. We discussed these findings with husband and proceeded with intubation and bronchoscopy.  The entire left lung appeared completely full of mucus impaction in both upper and lower  lobes during bronchoscopy.  CXR post bronchoscopy is in process.  08/08/23- patient is  extubated and is able to speak few words with family.  She moved her feet and right hand today.  I met with husband and son at bedside reviewed imaging and bloodwork. 08/09/23- patient had severe event overnight with hypoxemia , CXR This am with complete opacification of left lung again.  She went into respiratory distress overnight with severe hypoxemia.  She was emergently intubated overnight. WE notified familly and discussed events and imaging findings. She is s/p bronch with improved CXR post procedure.  08/10/23 - remains weak and minimally responsive, flaccid upper and lower extremities 08/11/23 - MRI performed overnight. Remains minimally responsive but does open eyes and track. PEG tube placed by surgery 08/12/23 - acute drop in hemoglobin. Mental status unchanged  Interim History / Subjective:  Opens eyes and tracks. Does not move extremities.  Objective   Blood pressure (!) 164/57, pulse 67, temperature 97.6 F (36.4 C), temperature source Axillary, resp. rate 20, height 5' 7 (1.702 m), weight 64.1 kg, SpO2 100%.    Vent Mode: PRVC FiO2 (%):  [30 %] 30 % Set Rate:  [16 bmp] 16 bmp Vt Set:  [450 mL] 450 mL PEEP:  [8 cmH20] 8 cmH20 Plateau Pressure:  [22 cmH20-29 cmH20] 22 cmH20   Intake/Output Summary (Last 24 hours) at 08/12/2023 1714 Last data filed at 08/12/2023 1650 Gross per 24 hour  Intake 4324.52 ml  Output 1945 ml  Net 2379.52 ml   Filed Weights   08/10/23 0500 08/11/23 0320 08/12/23 0500  Weight: 60.2 kg 63.3 kg 64.1 kg    Examination: Physical Exam Constitutional:      General: She is not in acute distress.    Appearance: She is ill-appearing.  Cardiovascular:     Rate and Rhythm: Normal rate and regular rhythm.     Pulses: Normal pulses.     Heart sounds: Normal heart sounds.  Pulmonary:     Comments: Ventilated breath sounds bilaterally Abdominal:     Palpations: Abdomen is soft.  Neurological:     Mental Status: She is disoriented.     Motor: Weakness  present.     Comments: Tracks with eyes, unable to move upper or lower extremities      Assessment & Plan:   #Acute Hypoxic Respiratory Failure #Aspiration Pneumonia #Toxic Metabolic Encephalopathy #Cervical Myelopathy s/p C2 dome laminectomy, C3-C6 laminoplasty; complicated by epidural hematoma s/p surgical decompression #Blood loss anemia #AKI on CKD stage III #T2DM #Hypothyroidism #Acute Blood Loss Anemia  Neuro - C2 laminectomy, C3-C6 laminoplasty, complicated by epidural hematoma requiring surgical decompression. Course complicated by metabolic encephalopathy with concern for lower and upper extremity weakness. CT head and neck without acute bleed, but given persistent symptoms and history of epidural hematoma an MRI of the brain and spine was obtained with no restricted diffusion suggestive of damage rather than infarct. Continue to target RASS of -1. Able to pull volumes with SBT suggesting preservation of phrenic nerve, but given significant lower and upper extremity weakness I suspect she will be ventilator dependent with a tracheostomy tube for the immediate future.  CV - was on phenylephrine  for sedation related hypotension. Concern for spinal shock given significant spinal cord injury. Was having atrial ectopy with nor-epinephrine  so switched to phenylephrine . Pressor requirements stable in the setting of likely spinal shock.  Pulm - intubated for respiratory failure secondary to encephalopthy complicated by aspiration pneumonia and white out of the left lung. Now  s/p flexible bronchoscopy for therapeutic aspiration of secretions, with improved aeration of the left lung. On mechanical ventilation, and performed well with SBT but mental status and significant upper and lower extremity weakness are barriers to extubation. Continue hypertonic saline and aggressive pulmonary toilet.  Renal - AKI on CKD, with non-anion gap metabolic acidosis. Received sodium bicarb yesterday for volume  repletion. Monitoring electrolytes and kidney function closely, avoiding nephrotoxins as able. Metabolic acidosis could be secondary to TPN which we will discontinue today.  GI - initiated on TPN given inability to pass an oral feeding tube due to esophageal strictures. PEG tube placed, will initiate tube feeds today. Continue PPI, increase to twice daily given concern for GI bleed. CT abdomen obtained today without contrast and showed fluid in the abdomen, potentially reflecting blood vs post surgical changes.  Endo - on basal bolus regimen of insulin  with glargine 5 units daily, aspart 3 units every 4 hours, and sliding scale.  Hem/Onc - enoxaparin  for DVT prophylaxis, will switch to heparin  5000 twice daily given AKI and concern for bleed. H/H drop today, with concern for GI bleed vs bleed post PEG tube placement. Received ddavp  given uremia, and PRBC's, platelets and FFP's. Goal Hemoglobin > 7  ID - aspiration pneumonia secondary to encephalopathy and recent surgery with inability to protect the airway. Respiratory cultures previously grew Candida Glabrata, which is likely a colonizer but with rising white count and initiation of TPN, I am concerned for fungemia and will obtain blood cultures. Will also broaden IV antibiotics to cover for HAP with Ceftazidime  (avoiding Cefepime  due to encephalopathy) and Vancomycin .   Best Practice (right click and Reselect all SmartList Selections daily)   Diet/type: TPN DVT prophylaxis prophylactic heparin   Pressure ulcer(s): N/A GI prophylaxis: PPI Lines: Central line and yes and it is still needed Foley:  Yes, and it is still needed Code Status:  full code Last date of multidisciplinary goals of care discussion [08/12/2023]  Labs   CBC: Recent Labs  Lab 08/08/23 0405 08/09/23 0637 08/10/23 0333 08/11/23 0351 08/12/23 0405 08/12/23 0759  WBC 13.0* 10.6* 24.4* 12.4* 15.5*  --   HGB 9.0* 7.9* 8.9* 7.3* 5.5* 5.4*  HCT 26.4* 25.1* 27.0* 22.3*  16.1* 15.9*  MCV 94.6 94.4 97.1 92.1 89.0  --   PLT 201 173 188 116* 151  --     Basic Metabolic Panel: Recent Labs  Lab 08/08/23 0405 08/08/23 0641 08/09/23 0351 08/10/23 0333 08/11/23 0351 08/12/23 0405  NA 146*  --  146* 142 141 140  K 4.3  --  4.0 4.6 4.3 4.6  CL 125*  --  125* 119* 115* 114*  CO2 14*  --  12* 14* 14* 16*  GLUCOSE 232*  --  128* 174* 271* 253*  BUN 70*  --  68* 80* 95* 105*  CREATININE 0.91  --  0.75 1.56* 1.92* 2.05*  CALCIUM  8.7*  --  8.7* 8.2* 8.0* 8.4*  MG  --  1.9 1.6* 2.7* 2.3 2.3  PHOS 2.6  --  2.5 4.0 3.8 3.5   GFR: Estimated Creatinine Clearance: 26.6 mL/min (A) (by C-G formula based on SCr of 2.05 mg/dL (H)). Recent Labs  Lab 08/09/23 0637 08/09/23 0750 08/10/23 0333 08/11/23 0351 08/12/23 0405  WBC 10.6*  --  24.4* 12.4* 15.5*  LATICACIDVEN  --  0.8  --   --   --     Liver Function Tests: Recent Labs  Lab 08/06/23 0348 08/07/23 0346 08/08/23 0405 08/09/23 0351  08/10/23 0333 08/12/23 0405  AST 20  --   --   --  25  --   ALT 16  --   --   --  14  --   ALKPHOS 49  --   --   --  59  --   BILITOT 0.7  --   --   --  0.6  --   PROT 6.1*  --   --   --  6.0*  --   ALBUMIN  3.2* 3.0* 3.4* 3.1* 3.2* 2.6*   No results for input(s): LIPASE, AMYLASE in the last 168 hours. No results for input(s): AMMONIA in the last 168 hours.  ABG    Component Value Date/Time   PHART 7.23 (L) 08/09/2023 0637   PCO2ART 31 (L) 08/09/2023 0637   PO2ART 88 08/09/2023 0637   HCO3 13.0 (L) 08/09/2023 0637   ACIDBASEDEF 13.3 (H) 08/09/2023 0637   O2SAT 98.5 08/09/2023 0637     Coagulation Profile: Recent Labs  Lab 08/05/23 1811  INR 1.2    Cardiac Enzymes: No results for input(s): CKTOTAL, CKMB, CKMBINDEX, TROPONINI in the last 168 hours.  HbA1C: Hemoglobin A1C  Date/Time Value Ref Range Status  02/20/2013 04:02 AM 10.3 (H) 4.2 - 6.3 % Final    Comment:    The American Diabetes Association recommends that a primary goal  of therapy should be <7% and that physicians should reevaluate the treatment regimen in patients with HbA1c values consistently >8%.    Hgb A1c MFr Bld  Date/Time Value Ref Range Status  04/13/2023 11:34 AM 5.9 (H) 4.8 - 5.6 % Final    Comment:    (NOTE) Pre diabetes:          5.7%-6.4%  Diabetes:              >6.4%  Glycemic control for   <7.0% adults with diabetes   08/11/2021 08:39 PM 7.2 (H) 4.8 - 5.6 % Final    Comment:    (NOTE) Pre diabetes:          5.7%-6.4%  Diabetes:              >6.4%  Glycemic control for   <7.0% adults with diabetes     CBG: Recent Labs  Lab 08/12/23 0334 08/12/23 0726 08/12/23 1119 08/12/23 1603 08/12/23 1628  GLUCAP 199* 203* 76 42* 163*    Review of Systems:   Unable to obtain  Past Medical History:  She,  has a past medical history of Acute incomplete quadriplegia (HCC), Acute respiratory failure with hypoxia (HCC), AKI (acute kidney injury) (HCC), Arthritis, Blood dyscrasia, Central cord syndrome (HCC), Dental caries, Diabetes mellitus without complication (HCC), Diverticulosis, Dysphagia, Esophageal stricture, Facial cellulitis, GERD (gastroesophageal reflux disease), Hypothyroidism, Neuropathy, Odynophagia, Pelvic fracture (HCC), Renal insufficiency, Spinal cord compression, post-traumatic (HCC), Spinal cord injury, cervical region (HCC), and Stage 3a chronic kidney disease (CKD) (HCC).   Surgical History:   Past Surgical History:  Procedure Laterality Date   ABDOMINAL HYSTERECTOMY     BILATERAL SALPINGECTOMY Bilateral 03/16/2017   Procedure: BILATERAL SALPINGECTOMY;  Surgeon: Schermerhorn, Debby PARAS, MD;  Location: [facility] ORS;  Service: Gynecology;  Laterality: Bilateral;   BIOPSY  05/03/2023   Procedure: BIOPSY;  Surgeon: Leigh Elspeth SQUIBB, MD;  Location: [facility] ENDOSCOPY;  Service: Gastroenterology;;   COLONOSCOPY WITH PROPOFOL  N/A 08/18/2018   Procedure: COLONOSCOPY WITH PROPOFOL ;  Surgeon: Viktoria Lamar DASEN, MD;  Location:  [facility] ENDOSCOPY;  Service: Endoscopy;  Laterality: N/A;   CYSTOCELE REPAIR  N/A 03/16/2017   Procedure: ANTERIOR REPAIR (CYSTOCELE);  Surgeon: Schermerhorn, Debby PARAS, MD;  Location: [facility] ORS;  Service: Gynecology;  Laterality: N/A;   ESOPHAGOGASTRODUODENOSCOPY (EGD) WITH PROPOFOL  N/A 08/18/2018   Procedure: ESOPHAGOGASTRODUODENOSCOPY (EGD) WITH PROPOFOL ;  Surgeon: Viktoria Lamar DASEN, MD;  Location: [facility] ENDOSCOPY;  Service: Endoscopy;  Laterality: N/A;   ESOPHAGOGASTRODUODENOSCOPY (EGD) WITH PROPOFOL  Left 05/01/2023   Procedure: ESOPHAGOGASTRODUODENOSCOPY (EGD) WITH PROPOFOL ;  Surgeon: Leigh Elspeth SQUIBB, MD;  Location: [facility] ENDOSCOPY;  Service: Gastroenterology;  Laterality: Left;   ESOPHAGOGASTRODUODENOSCOPY (EGD) WITH PROPOFOL  N/A 05/03/2023   Procedure: ESOPHAGOGASTRODUODENOSCOPY (EGD) WITH PROPOFOL ;  Surgeon: Leigh Elspeth SQUIBB, MD;  Location: [facility] ENDOSCOPY;  Service: Gastroenterology;  Laterality: N/A;   GASTROSTOMY N/A 08/11/2023   Procedure: INSERTION OF GASTROSTOMY TUBE;  Surgeon: Jordis Laneta FALCON, MD;  Location: [facility] ORS;  Service: General;  Laterality: N/A;   POSTERIOR CERVICAL LAMINECTOMY N/A 07/28/2023   Procedure: C2 DOME LAMINECTOMY;  Surgeon: [name] ORN, MD;  Location: [facility] ORS;  Service: Neurosurgery;  Laterality: N/A;   POSTERIOR CERVICAL LAMINECTOMY N/A 08/01/2023   Procedure: POSTERIOR CERVICAL LAMINECTOMY;  Surgeon: [name] ORN, MD;  Location: [facility] ORS;  Service: Neurosurgery;  Laterality: N/A;   TEAR DUCT PROBING Right    TUBAL LIGATION     VAGINAL HYSTERECTOMY N/A 03/16/2017   Procedure: HYSTERECTOMY VAGINAL;  Surgeon: Schermerhorn, Debby PARAS, MD;  Location: [facility] ORS;  Service: Gynecology;  Laterality: N/A;   WISDOM TOOTH EXTRACTION       Social History:   reports that she has never smoked. She has never used smokeless tobacco. She reports that she does not drink alcohol and does not use drugs.   Family History:  Her family history includes Breast cancer in her  maternal aunt; Diabetes in her father; Hypertension in her father and mother.   Allergies Allergies  Allergen Reactions   Amoxicillin -Pot Clavulanate Itching and Anaphylaxis   Ampicillin Rash     Home Medications  Prior to Admission medications   Medication Sig Start Date End Date Taking? Authorizing Provider  acetaminophen  (TYLENOL ) 650 MG CR tablet Take 650 mg by mouth every 8 (eight) hours as needed for pain.   Yes [provider]  chlorhexidine  (HIBICLENS ) 4 % external liquid Apply 15 mLs (1 Application total) topically as directed for 30 doses. Use as directed daily for 5 days every other week for 6 weeks. 07/28/23  Yes Ulis Bottcher, PA-C  cyanocobalamin  (VITAMIN B12) 1000 MCG tablet Take 1,000 mcg by mouth daily.   Yes [provider]  cyclobenzaprine  (FLEXERIL ) 10 MG tablet Take 1 tablet (10 mg total) by mouth at bedtime. 06/15/23  Yes Lovorn, Megan, MD  diclofenac  Sodium (VOLTAREN ) 1 % GEL Apply 2 g topically 4 (four) times daily. Patient taking differently: Apply 2 g topically 4 (four) times daily as needed (pain). 05/11/23  Yes Setzer, Nena PARAS, PA-C  fludrocortisone  (FLORINEF ) 0.1 MG tablet Take 2 tablets (0.2 mg total) by mouth daily. For orthostatic hypotension 06/15/23  Yes Lovorn, Megan, MD  gabapentin  (NEURONTIN ) 100 MG capsule Take 200 mg by mouth 2 (two) times daily.  08/16/15  Yes [provider]  glipiZIDE  (GLUCOTROL ) 5 MG tablet Take 10 mg by mouth daily before breakfast.   Yes [provider]  JARDIANCE  25 MG TABS tablet Take 25 mg by mouth daily.   Yes [provider]  lactase (LACTAID) 3000 units tablet Take 3,000 Units by mouth daily before breakfast.   Yes [provider]  levothyroxine  (SYNTHROID ) 88 MCG tablet  Take 88 mcg by mouth daily before breakfast.   Yes [provider]  midodrine  (PROAMATINE ) 10 MG tablet Take 1.5 tablets (15 mg total) by mouth 3 (three) times daily with meals. For orthostatic  hypotension 06/15/23  Yes Lovorn, Megan, MD  Multiple Vitamin (MULTIVITAMIN WITH MINERALS) TABS tablet Take 1 tablet by mouth daily. 04/22/23  Yes Alexander, Natalie, DO  mupirocin  ointment (BACTROBAN ) 2 % Place 1 Application into the nose 2 (two) times daily for 60 doses. Use as directed 2 times daily for 5 days every other week for 6 weeks. 07/28/23 08/27/23 Yes Ulis Bottcher, PA-C  pioglitazone  (ACTOS ) 30 MG tablet Take 30 mg by mouth daily.   Yes [provider]  Psyllium (METAMUCIL 3 IN 1 DAILY FIBER PO) Take 2 capsules by mouth daily.   Yes [provider]  rosuvastatin  (CRESTOR ) 5 MG tablet Take 5 mg by mouth at bedtime.   Yes [provider]  senna (SENOKOT) 8.6 MG TABS tablet Take 1 tablet (8.6 mg total) by mouth at bedtime. 05/11/23  Yes Setzer, Nena PARAS, PA-C     Critical care time: 38 minutes    Belva November, MD [facility] 08/12/2023 5:14 PM

## 2023-08-12 NOTE — Progress Notes (Addendum)
 Neurosurgery Progress Note  History: Darionna Cost is here for C2 dome laminectomy, C3-6 laminoplasty and now s/p evacuation hematoma  POD14/11: PEG placed yesterday. Pt with worsening anemia. Neurologically unchanged  POD13/10: MRI this morning does not appear to be consistent with spinal cord infarct.  Patient's neurologic exam is unchanged. POD12/9: Pt had neuro change early this morning prompting CT head and C spine.  POD 11/7: Was extubated this morning prior to evaluation.  Was able to track with eyes but was quite somnolent still.  Was reacting to painful stimuli in the bilateral lower extremities.  Left upper extremity was reacting to painful stimuli as well. POD 10/6: Pt experiencing increased work of breathing this morning and worsening findings on chest xray resulting in reintubation this morning. She is currently undergoing bronchoscopy by Dr. Aleskerov  POD 9/5: Pt more alert this morning asking what day it is  POD 8/4: Very drowsy but arouses this morning to voice.  POD 7/3: Pt is very drowsy, but able to follow some commands  POD6/2: Pt is drowsy this morning asking what do you want when awaken  Interval POD5: Ms. Dark had a decline yesterday with her left upper extremity and then bilateral lower extremities.  CT scan was unremarkable MRI did show a large epidural hematoma.  She was taken emergently to the OR by Dr. Claudene for decompression.  She has been monitored in the ICU overnight. She is still intubated and on sedation. She is on pressors to maintain [facility]   Hospital Course POD4: Ms Allington is more awake today. She states she isnt having a lot of pain but she is thirsty.  POD3:Patient more alert this AM. However; hypoxia, tachycardia, SOB, low energy.  POD2: Very lethargic and difficult to arouse, states in quite a bit of pain. Medicine had been held due to sleepiness.    Physical Exam: Vitals:   08/12/23 0700 08/12/23 0718  BP: (!) 132/36   Pulse: 81   Resp:  (!) 24   Temp:    SpO2: 100% 100%    Intubated. Opens eyes to voice Does not withdrawal in extremities to pain. Continued involuntary movement of of lower extremities   Data:  Output by Drain (mL) 08/10/23 0701 - 08/10/23 1900 08/10/23 1901 - 08/11/23 0700 08/11/23 0701 - 08/11/23 1900 08/11/23 1901 - 08/12/23 0700 08/12/23 0701 - 08/12/23 0830  Requested LDAs do not have output data documented.     Assessment/Plan:  Harmony Stfleur  s/p C2 dome laminectomy, C3-6 laminoplasty  On 1/21 with return to OR on 1/25 for cervical/thoracic laminectomy and epidural hematoma evacuation.  She has had a complicated postoperative course with a spontaneous cervical thoracic hemorrhage on 125 which was taken emergently for decompression.  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  - MRI of neuro axis 08/10/23 showing diffuse central spinal cord swelling concerning for spinal cord infarct.  DWI series repeated 2/4  which did not show restricted diffusion.  - pain control continue with IV and oral medications -Given her immobilized status and risk for DVT PE we are resuming anticoagulation knowing that she would be at risk for repeat hemorrhage.  - PT/OT when able - HV removed on 1/29 - PICC placement and TPN started.  - pneumonia; recommendations per CC  - CC and IM assisting with non surgical management. We appreciate your assistance.   Edsel Goods PA-C Department of  Neurosurgery   Addendum  This afternoon I had a meeting with the family to discuss their family conference the night before and to discuss updates from her recent testing.  The patient's daughter, son, husband, and brother were all at bedside for this discussion.  We updated them about the MRI findings that there was not a stroke in the spinal cord but that there was a severe injury that spans the entire length of  the spinal cord from the junction of the brainstem down.  This is likely sustained and worsened after the delayed hematoma postoperatively.  I discussed with them that given the severity of these findings functional recovery of her upper or lower extremities is unlikely to occur.  She is already had a enteric tube placed for her nutrition, but I did discuss that patients with this severe of an injury will often require a tracheostomy tube.  We discussed that the spinal cord would explain why her arms and legs are no longer moving, but that it would not explain her overall decreased level of cognition.  I performed a bedside examination, and she did not appear to be responding to ocular threats or to questioning with eye movements blinking or facial movements.  Given the level of her injury these should still be intact.  She is likely in a severe metabolic encephalopathy from her prolonged illness, continued need for multiple medications, and complicated medical course.  Given that her examination is difficult to perform we feel she may benefit from a EEG to evaluate for encephalopathy or any focal brain injuries.  The MRI was performed and did not measure any mass lesions or areas of compression in the brain or areas of stroke in the brain, however the MRI does not give a clear reflection of function.  They like to go forward with an EEG and I will plan to reach out to the intensive care unit team.  I answered questions to the family's approval, and let them know I would be available for any further questioning.

## 2023-08-12 NOTE — Progress Notes (Signed)
 Hypoglycemic Event  CBG: 42  Treatment: D50 50 mL (25 gm)  Symptoms: None  Follow-up CBG: Time:1628 CBG Result:163  Possible Reasons for Event: Inadequate meal intake  Comments/MD notified: Sheffield Dense

## 2023-08-12 NOTE — Plan of Care (Signed)
  Problem: Education: Goal: Ability to describe self-care measures that may prevent or decrease complications (Diabetes Survival Skills Education) will improve Outcome: Not Progressing   Problem: Coping: Goal: Ability to adjust to condition or change in health will improve Outcome: Not Progressing   Problem: Fluid Volume: Goal: Ability to maintain a balanced intake and output will improve Outcome: Not Progressing   Problem: Health Behavior/Discharge Planning: Goal: Ability to identify and utilize available resources and services will improve Outcome: Not Progressing Goal: Ability to manage health-related needs will improve Outcome: Not Progressing   Problem: Metabolic: Goal: Ability to maintain appropriate glucose levels will improve Outcome: Not Progressing   Problem: Nutritional: Goal: Maintenance of adequate nutrition will improve Outcome: Not Progressing Goal: Progress toward achieving an optimal weight will improve Outcome: Not Progressing   Problem: Skin Integrity: Goal: Risk for impaired skin integrity will decrease Outcome: Not Progressing   Problem: Tissue Perfusion: Goal: Adequacy of tissue perfusion will improve Outcome: Not Progressing   Problem: Education: Goal: Knowledge of General Education information will improve Description: Including pain rating scale, medication(s)/side effects and non-pharmacologic comfort measures Outcome: Not Progressing   Problem: Health Behavior/Discharge Planning: Goal: Ability to manage health-related needs will improve Outcome: Not Progressing   Problem: Clinical Measurements: Goal: Ability to maintain clinical measurements within normal limits will improve Outcome: Not Progressing Goal: Will remain free from infection Outcome: Not Progressing Goal: Diagnostic test results will improve Outcome: Not Progressing Goal: Respiratory complications will improve Outcome: Not Progressing Goal: Cardiovascular complication will  be avoided Outcome: Not Progressing   Problem: Activity: Goal: Risk for activity intolerance will decrease Outcome: Not Progressing   Problem: Nutrition: Goal: Adequate nutrition will be maintained Outcome: Not Progressing   Problem: Coping: Goal: Level of anxiety will decrease Outcome: Not Progressing   Problem: Elimination: Goal: Will not experience complications related to bowel motility Outcome: Not Progressing Goal: Will not experience complications related to urinary retention Outcome: Not Progressing   Problem: Pain Managment: Goal: General experience of comfort will improve and/or be controlled Outcome: Not Progressing   Problem: Safety: Goal: Ability to remain free from injury will improve Outcome: Not Progressing   Problem: Skin Integrity: Goal: Risk for impaired skin integrity will decrease Outcome: Not Progressing   Problem: Education: Goal: Ability to verbalize activity precautions or restrictions will improve Outcome: Not Progressing Goal: Knowledge of the prescribed therapeutic regimen will improve Outcome: Not Progressing Goal: Understanding of discharge needs will improve Outcome: Not Progressing   Problem: Activity: Goal: Ability to avoid complications of mobility impairment will improve Outcome: Not Progressing Goal: Ability to tolerate increased activity will improve Outcome: Not Progressing Goal: Will remain free from falls Outcome: Not Progressing   Problem: Bowel/Gastric: Goal: Gastrointestinal status for postoperative course will improve Outcome: Not Progressing   Problem: Clinical Measurements: Goal: Ability to maintain clinical measurements within normal limits will improve Outcome: Not Progressing Goal: Postoperative complications will be avoided or minimized Outcome: Not Progressing Goal: Diagnostic test results will improve Outcome: Not Progressing   Problem: Pain Management: Goal: Pain level will decrease Outcome: Not  Progressing   Problem: Skin Integrity: Goal: Will show signs of wound healing Outcome: Not Progressing   Problem: Health Behavior/Discharge Planning: Goal: Identification of resources available to assist in meeting health care needs will improve Outcome: Not Progressing   Problem: Bladder/Genitourinary: Goal: Urinary functional status for postoperative course will improve Outcome: Not Progressing

## 2023-08-12 NOTE — Procedures (Addendum)
 Patient Name: [name]  MRN: [id number]  Epilepsy Attending: Arlin MALVA Krebs  Referring Physician/Provider: [name] ORN, MD  Date: 08/12/2023 Duration: 34.12 mins  Patient history: 67yo f with ams getting eeg to evaluate for seizure  Level of alertness:  comatose/ lethargic   AEDs during EEG study: None  Technical aspects: This EEG study was done with scalp electrodes positioned according to the 10-20 International system of electrode placement. Electrical activity was reviewed with band pass filter of 1-70Hz , sensitivity of 7 uV/mm, display speed of 60mm/sec with a 60Hz  notched filter applied as appropriate. EEG data were recorded continuously and digitally stored.  Video monitoring was available and reviewed as appropriate.  Description: EEG showed continuous generalized 5 to 7 Hz theta slowing admixed with intermittent 2-3hz  delta slowing. Generalized periodic discharges with triphasic morphology were noted at 0.5-1hz  intermittently.  Hyperventilation and photic stimulation were not performed.     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 No seizures or definite epileptiform discharges were seen throughout the recording.  Nyjae Hodge O Darothy Courtright

## 2023-08-12 NOTE — Plan of Care (Signed)
  Problem: Education: Goal: Ability to describe self-care measures that may prevent or decrease complications (Diabetes Survival Skills Education) will improve Outcome: Progressing   Problem: Coping: Goal: Ability to adjust to condition or change in health will improve Outcome: Progressing   Problem: Fluid Volume: Goal: Ability to maintain a balanced intake and output will improve Outcome: Progressing   Problem: Health Behavior/Discharge Planning: Goal: Ability to identify and utilize available resources and services will improve Outcome: Progressing Goal: Ability to manage health-related needs will improve Outcome: Progressing   Problem: Metabolic: Goal: Ability to maintain appropriate glucose levels will improve Outcome: Progressing   Problem: Nutritional: Goal: Maintenance of adequate nutrition will improve Outcome: Progressing Goal: Progress toward achieving an optimal weight will improve Outcome: Progressing   Problem: Skin Integrity: Goal: Risk for impaired skin integrity will decrease Outcome: Progressing   Problem: Tissue Perfusion: Goal: Adequacy of tissue perfusion will improve Outcome: Progressing   Problem: Education: Goal: Knowledge of General Education information will improve Description: Including pain rating scale, medication(s)/side effects and non-pharmacologic comfort measures Outcome: Progressing   Problem: Health Behavior/Discharge Planning: Goal: Ability to manage health-related needs will improve Outcome: Progressing   Problem: Clinical Measurements: Goal: Ability to maintain clinical measurements within normal limits will improve Outcome: Progressing Goal: Will remain free from infection Outcome: Progressing Goal: Diagnostic test results will improve Outcome: Progressing Goal: Respiratory complications will improve Outcome: Progressing Goal: Cardiovascular complication will be avoided Outcome: Progressing   Problem: Activity: Goal:  Risk for activity intolerance will decrease Outcome: Progressing   Problem: Nutrition: Goal: Adequate nutrition will be maintained Outcome: Progressing   Problem: Coping: Goal: Level of anxiety will decrease Outcome: Progressing   Problem: Elimination: Goal: Will not experience complications related to bowel motility Outcome: Progressing Goal: Will not experience complications related to urinary retention Outcome: Progressing   Problem: Pain Managment: Goal: General experience of comfort will improve and/or be controlled Outcome: Progressing   Problem: Safety: Goal: Ability to remain free from injury will improve Outcome: Progressing   Problem: Skin Integrity: Goal: Risk for impaired skin integrity will decrease Outcome: Progressing   Problem: Education: Goal: Ability to verbalize activity precautions or restrictions will improve Outcome: Progressing Goal: Knowledge of the prescribed therapeutic regimen will improve Outcome: Progressing Goal: Understanding of discharge needs will improve Outcome: Progressing   Problem: Activity: Goal: Ability to avoid complications of mobility impairment will improve Outcome: Progressing Goal: Ability to tolerate increased activity will improve Outcome: Progressing Goal: Will remain free from falls Outcome: Progressing   Problem: Bowel/Gastric: Goal: Gastrointestinal status for postoperative course will improve Outcome: Progressing   Problem: Clinical Measurements: Goal: Ability to maintain clinical measurements within normal limits will improve Outcome: Progressing Goal: Postoperative complications will be avoided or minimized Outcome: Progressing Goal: Diagnostic test results will improve Outcome: Progressing   Problem: Pain Management: Goal: Pain level will decrease Outcome: Progressing   Problem: Skin Integrity: Goal: Will show signs of wound healing Outcome: Progressing   Problem: Health Behavior/Discharge  Planning: Goal: Identification of resources available to assist in meeting health care needs will improve Outcome: Progressing   Problem: Bladder/Genitourinary: Goal: Urinary functional status for postoperative course will improve Outcome: Progressing   Problem: Activity: Goal: Ability to tolerate increased activity will improve Outcome: Progressing   Problem: Respiratory: Goal: Ability to maintain a clear airway and adequate ventilation will improve Outcome: Progressing   Problem: Role Relationship: Goal: Method of communication will improve Outcome: Progressing

## 2023-08-12 NOTE — Progress Notes (Signed)
 [facility]  ROUNDING NOTE   Subjective:   Patient is making adequate amounts of urine however she has elevated BUN of 105 and creatinine of 2.05. Hemoglobin also quite low at 5.4 and transfusion being planned. Fluid noted within the pelvis.  Objective:  Vital signs in last 24 hours:  Temp:  [97.6 F (36.4 C)-99.3 F (37.4 C)] 97.6 F (36.4 C) (02/05 1650) Pulse Rate:  [62-85] 67 (02/05 1700) Resp:  [17-28] 20 (02/05 1700) BP: (94-172)/(36-79) 164/57 (02/05 1700) SpO2:  [95 %-100 %] 100 % (02/05 1700) Arterial Line BP: (89-174)/(39-68) 166/59 (02/05 1700) FiO2 (%):  [30 %] 30 % (02/05 1609) Weight:  [64.1 kg] 64.1 kg (02/05 0500)  Weight change: 0.8 kg Filed Weights   08/10/23 0500 08/11/23 0320 08/12/23 0500  Weight: 60.2 kg 63.3 kg 64.1 kg    Intake/Output: I/O last 3 completed shifts: In: 4878.5 [I.V.:4573.8; IV Piggyback:304.7] Out: 4035 [Urine:4035]   Intake/Output this shift:  Total I/O In: 2302.5 [I.V.:1368.4; Blood:884; IV Piggyback:50] Out: 850 [Urine:850]  Physical Exam: General: Critically ill-appearing  Head: Normocephalic, atraumatic.  Endotracheal tube in place  Neck: Supple  Lungs:  Scattered rhonchi, vent assisted  Heart: S1S2 no rubs  Abdomen:  Soft, nontender, bowel sounds present  Extremities: Trace peripheral edema.  Neurologic: Intubated on the ventilator  Skin: No acute rash  Access: No hemodialysis access    Basic Metabolic Panel: Recent Labs  Lab 08/08/23 0405 08/08/23 0641 08/09/23 0351 08/10/23 0333 08/11/23 0351 08/12/23 0405  NA 146*  --  146* 142 141 140  K 4.3  --  4.0 4.6 4.3 4.6  CL 125*  --  125* 119* 115* 114*  CO2 14*  --  12* 14* 14* 16*  GLUCOSE 232*  --  128* 174* 271* 253*  BUN 70*  --  68* 80* 95* 105*  CREATININE 0.91  --  0.75 1.56* 1.92* 2.05*  CALCIUM  8.7*  --  8.7* 8.2* 8.0* 8.4*  MG  --  1.9 1.6* 2.7* 2.3 2.3  PHOS 2.6  --  2.5 4.0 3.8 3.5    Liver Function Tests: Recent Labs  Lab  08/06/23 0348 08/07/23 0346 08/08/23 0405 08/09/23 0351 08/10/23 0333 08/12/23 0405  AST 20  --   --   --  25  --   ALT 16  --   --   --  14  --   ALKPHOS 49  --   --   --  59  --   BILITOT 0.7  --   --   --  0.6  --   PROT 6.1*  --   --   --  6.0*  --   ALBUMIN  3.2* 3.0* 3.4* 3.1* 3.2* 2.6*   No results for input(s): LIPASE, AMYLASE in the last 168 hours. No results for input(s): AMMONIA in the last 168 hours.  CBC: Recent Labs  Lab 08/08/23 0405 08/09/23 0637 08/10/23 0333 08/11/23 0351 08/12/23 0405 08/12/23 0759  WBC 13.0* 10.6* 24.4* 12.4* 15.5*  --   HGB 9.0* 7.9* 8.9* 7.3* 5.5* 5.4*  HCT 26.4* 25.1* 27.0* 22.3* 16.1* 15.9*  MCV 94.6 94.4 97.1 92.1 89.0  --   PLT 201 173 188 116* 151  --     Cardiac Enzymes: No results for input(s): CKTOTAL, CKMB, CKMBINDEX, TROPONINI in the last 168 hours.  BNP: Invalid input(s): POCBNP  CBG: Recent Labs  Lab 08/12/23 0334 08/12/23 0726 08/12/23 1119 08/12/23 1603 08/12/23 1628  GLUCAP 199* 203* 76  42* 163*    Microbiology: Results for orders placed or performed during the hospital encounter of 07/28/23  MRSA Next Gen by PCR, Nasal     Status: Abnormal   Collection Time: 08/01/23  5:00 AM   Specimen: Nasal Mucosa; Nasal Swab  Result Value Ref Range Status   MRSA by PCR Next Gen DETECTED (A) NOT DETECTED Final    Comment: RESULT CALLED TO, READ BACK BY AND VERIFIED WITH: JOAN WILLIS @0923  08/01/23 MJU (NOTE) The GeneXpert MRSA Assay (FDA approved for NASAL specimens only), is one component of a comprehensive MRSA colonization surveillance program. It is not intended to diagnose MRSA infection nor to guide or monitor treatment for MRSA infections. Test performance is not FDA approved in patients less than 86 years old. Performed at [facility], [address] [place], [place] [address]   Culture, BAL-quantitative w Gram Stain     Status: Abnormal   Collection Time: 08/07/23 11:57 AM    Specimen: Bronchoalveolar Lavage; Respiratory  Result Value Ref Range Status   Specimen Description   Final    BRONCHIAL ALVEOLAR LAVAGE Performed at [facility], [address] [place], [place] [address]    Special Requests   Final    NONE Performed at [facility], [address] [place], [place] [address]    Gram Stain   Final    MODERATE WBC PRESENT,BOTH PMN AND MONONUCLEAR FEW YEAST Performed at [facility], [address] [place], [place] [address]    Culture 30,000 COLONIES/mL CANDIDA GLABRATA (A)  Final   Report Status 08/10/2023 FINAL  Final  Culture, BAL-quantitative w Gram Stain     Status: Abnormal   Collection Time: 08/09/23 10:36 AM   Specimen: Bronchoalveolar Lavage; Respiratory  Result Value Ref Range Status   Specimen Description   Final    BRONCHIAL ALVEOLAR LAVAGE Performed at [facility], [address] [place], [place] [address]    Special Requests   Final    NONE Performed at [facility], [address] [place], [place] [address]    Gram Stain   Final    MODERATE WBC PRESENT,BOTH PMN AND MONONUCLEAR RARE GRAM POSITIVE COCCI IN PAIRS FEW YEAST Performed at [facility], [address], [place], [place] [address]    Culture 80,000 COLONIES/mL CANDIDA GLABRATA (A)  Final   Report Status 08/12/2023 FINAL  Final  Culture, blood (Routine X 2) w Reflex to ID Panel     Status: None (Preliminary result)   Collection Time: 08/10/23 11:45 AM   Specimen: BLOOD RIGHT ARM  Result Value Ref Range Status   Specimen Description BLOOD RIGHT ARM  Final   Special Requests   Final    BOTTLES DRAWN AEROBIC AND ANAEROBIC Blood Culture adequate volume   Culture   Final    NO GROWTH 2 DAYS Performed at [facility], [address], [place], [place] [address]    Report Status PENDING  Incomplete  Culture, blood (Routine X 2) w Reflex to ID Panel     Status: None (Preliminary result)   Collection  Time: 08/10/23 11:46 AM   Specimen: BLOOD LEFT HAND  Result Value Ref Range Status   Specimen Description BLOOD LEFT HAND  Final   Special Requests   Final    BOTTLES DRAWN AEROBIC AND ANAEROBIC Blood Culture adequate volume   Culture   Final    NO GROWTH 2 DAYS Performed at [facility], [address] [place], [place] [address]    Report Status PENDING  Incomplete    Coagulation Studies: No results for input(s): LABPROT, INR in the last 72 hours.  Urinalysis: No results for input(s): COLORURINE, LABSPEC, PHURINE, GLUCOSEU, HGBUR, BILIRUBINUR, KETONESUR, PROTEINUR, UROBILINOGEN, NITRITE, LEUKOCYTESUR in the last 72 hours.  Invalid input(s): APPERANCEUR    Imaging: CT ABDOMEN PELVIS WO CONTRAST Result Date: 08/12/2023 CLINICAL DATA:  68 year old female with sepsis, query abdominal or pelvic hemorrhage on noncontrast exam. Postoperative day 1 status post surgically placed gastrostomy tube. EXAM: CT ABDOMEN AND PELVIS WITHOUT CONTRAST TECHNIQUE: Multidetector CT imaging of the abdomen and pelvis was performed following the standard protocol without IV contrast. RADIATION DOSE REDUCTION: This exam was performed according to the departmental dose-optimization program which includes automated exposure control, adjustment of the mA and/or kV according to patient size and/or use of iterative reconstruction technique. COMPARISON:  Noncontrast CT Abdomen and Pelvis 08/10/2023. FINDINGS: Lower chest: Unresolved confluent left lung base opacity which is partially visible, small superimposed left pleural effusion. This most resemble pneumonia on chest CT last month. Contralateral right lung base with minimal dependent atelectasis. No pericardial or right pleural effusion. Hepatobiliary: Motion artifact. Partially contracted gallbladder. Cholelithiasis suspected. Grossly stable noncontrast liver. Pancreas: Stable. Spleen: New perisplenic fluid although with seemingly  simple fluid density on series 2, image 11. Motion artifact. Adrenals/Urinary Tract: Normal adrenal glands. Chronic left renal atrophy. Noncontrast right kidney and ureter appear stable. Foley catheter within the bladder which is not fully decompressed. Retroperitoneal spaces, psoas muscles appears stable. Stomach/Bowel: New percutaneous gastrostomy and midline abdominal skin staples. Small volume of postoperative pneumoperitoneum in the upper abdomen. Small volume retained fluid in the stomach. Motion artifact. No dilated large or small bowel loops. The proximal ascending colon abnormality described on 08/10/2023 is not hree identified but could be obscured today. Large bowel diverticulosis. Vascular/Lymphatic: Vascular patency is not evaluated in the absence of IV contrast. Aortoiliac calcified atherosclerosis. Normal caliber abdominal aorta. No lymphadenopathy identified. 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 Diminutive or absent ovaries. Other: Moderate to large volume of mostly new pelvic free fluid, although has simple fluid density (series 2, image 63). Musculoskeletal: No acute osseous abnormality identified. Some generalized body wall edema which appears increased. IMPRESSION: 1. New free fluid in the abdomen and pelvis, including adjacent to the spleen, and Moderate Volume overall. Hemoperitoneum cannot be excluded, although the fluid has fairly simple density favoring ascites. 2. Postoperative day 1 from surgical G-tube placement, no gastrostomy tube adverse features. Small volume postoperative pneumoperitoneum. 3. No retroperitoneal or other acute hemorrhage identified in the noncontrast abdomen or pelvis. 4. Unresolved left lung base pneumonia. Stable small left pleural effusion. Cholelithiasis. Large bowel diverticulosis. Chronic left renal atrophy. Electronically Signed   By: VEAR Hurst M.D.   On: 08/12/2023 11:20   MR THORACIC SPINE WO CONTRAST Result Date: 08/11/2023 CLINICAL DATA:   Initial evaluation for abnormal posture, DWI sequences only. EXAM: MRI CERVICAL, THORACIC AND LUMBAR SPINE WITHOUT CONTRAST TECHNIQUE: Multiplanar and multiecho pulse sequences of the cervical spine, to include the craniocervical junction and cervicothoracic junction, and thoracic and lumbar spine, were obtained without intravenous contrast. COMPARISON:  Prior MRI from 08/10/2023 as well as earlier studies. FINDINGS: MRI CERVICAL SPINE FINDINGS Alignment: Physiologic with preservation of the normal cervical lordosis. No interval malalignment. Vertebrae: Postoperative changes from prior posterior decompression at C3 through C6 with C2 dome laminectomy, better seen on prior exams. Vertebral body height maintained with no visible acute or interval fracture. Cord:  Again seen is diffuse cord signal abnormality extending from the cervicomedullary junction through the upper cervical spinal cord. Associated cord swelling/edema. Region of signal abnormality demonstrates facilitated diffusion, with no restricted diffusion to suggest acute cord infarct. 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 Posterior Fossa, vertebral arteries, paraspinal tissues: Postoperative changes within the posterior paraspinous soft tissues. No visible loculated collections on this limited exam. Disc levels: Underlying cervical spondylosis, described on recent MRI. MRI THORACIC SPINE FINDINGS Alignment: Limited diffusion-weighted images of the thoracic spine were performed. Stable alignment with preservation of the normal thoracic kyphosis. No visible interval listhesis or malalignment. Vertebrae: Vertebral body height grossly maintained with no visible acute or interval fracture. Cord: Limited diffusion-weighted sequences of the thoracic spinal cord only were obtained. Provided images are degraded by motion and fairly limited. Previously  seen diffuse cord signal abnormality extending through the majority of the thoracic cord to approximately the T11-12 level, corresponding with abnormality on prior MRI. Area demonstrates grossly facilitated diffusion throughout this region. No visible restricted diffusion to suggest spinal cord infarct. Given this, finding felt to be most consistent with acute spinal cord injury related to previously identified epidural hematoma and cord compression. Paraspinal and other soft tissues: No visible abnormality. Disc levels: Grossly stable from prior, described on previous exams. MRI LUMBAR SPINE FINDINGS Segmentation: Limited DWI sequences of the lumbar spine only were performed. Standard segmentation. Lowest well-formed disc space labeled the L5-S1 level. Alignment:  Physiologic. Vertebrae: Vertebral body height maintained without acute or interval fracture. Conus medullaris and cauda equina: Conus extends to the L1-2 level. Visualized conus and cauda equina grossly within normal limits. No restricted diffusion to suggest final cord infarct. Paraspinal and other soft tissues: No visible acute abnormality on this limited exam. Disc levels: Grossly stable, described on prior exam. IMPRESSION: 1. Diffuse cord signal abnormality extending from the cervicomedullary junction to approximately the T11-12 level, corresponding with abnormality seen on prior MRI from 08/10/2023. Region of signal abnormality demonstrates facilitated diffusion, with no visible restricted diffusion to suggest acute spinal cord infarct. 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 2. Postoperative changes from prior C3-C6 laminoplasty with C2 dome laminectomy, followed by subsequent epidural evacuation. Electronically Signed   By: Morene Hoard M.D.   On: 08/11/2023 03:26   MR LUMBAR SPINE WO CONTRAST Result Date:  08/11/2023 CLINICAL DATA:  Initial evaluation for abnormal posture, DWI sequences only. EXAM: MRI CERVICAL, THORACIC AND LUMBAR SPINE WITHOUT CONTRAST TECHNIQUE: Multiplanar and multiecho pulse sequences of the cervical spine, to include the craniocervical junction and cervicothoracic junction, and thoracic and lumbar spine, were obtained without intravenous contrast. COMPARISON:  Prior MRI from 08/10/2023 as well as earlier studies. FINDINGS: MRI CERVICAL SPINE FINDINGS Alignment: Physiologic with preservation of the normal cervical lordosis. No interval malalignment. Vertebrae: Postoperative changes from prior posterior decompression at C3 through C6 with C2 dome laminectomy, better seen on prior exams. Vertebral body height maintained with no visible acute or interval fracture. Cord: Again seen is diffuse cord signal abnormality extending from the cervicomedullary junction through the upper cervical spinal cord. Associated cord swelling/edema. Region of signal abnormality demonstrates facilitated diffusion, with no restricted diffusion to suggest acute cord infarct. 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 Posterior Fossa,  vertebral arteries, paraspinal tissues: Postoperative changes within the posterior paraspinous soft tissues. No visible loculated collections on this limited exam. Disc levels: Underlying cervical spondylosis, described on recent MRI. MRI THORACIC SPINE FINDINGS Alignment: Limited diffusion-weighted images of the thoracic spine were performed. Stable alignment with preservation of the normal thoracic kyphosis. No visible interval listhesis or malalignment. Vertebrae: Vertebral body height grossly maintained with no visible acute or interval fracture. Cord: Limited diffusion-weighted sequences of the thoracic spinal cord only were obtained. Provided images are degraded by motion and  fairly limited. Previously seen diffuse cord signal abnormality extending through the majority of the thoracic cord to approximately the T11-12 level, corresponding with abnormality on prior MRI. Area demonstrates grossly facilitated diffusion throughout this region. No visible restricted diffusion to suggest spinal cord infarct. Given this, finding felt to be most consistent with acute spinal cord injury related to previously identified epidural hematoma and cord compression. Paraspinal and other soft tissues: No visible abnormality. Disc levels: Grossly stable from prior, described on previous exams. MRI LUMBAR SPINE FINDINGS Segmentation: Limited DWI sequences of the lumbar spine only were performed. Standard segmentation. Lowest well-formed disc space labeled the L5-S1 level. Alignment:  Physiologic. Vertebrae: Vertebral body height maintained without acute or interval fracture. Conus medullaris and cauda equina: Conus extends to the L1-2 level. Visualized conus and cauda equina grossly within normal limits. No restricted diffusion to suggest final cord infarct. Paraspinal and other soft tissues: No visible acute abnormality on this limited exam. Disc levels: Grossly stable, described on prior exam. IMPRESSION: 1. Diffuse cord signal abnormality extending from the cervicomedullary junction to approximately the T11-12 level, corresponding with abnormality seen on prior MRI from 08/10/2023. Region of signal abnormality demonstrates facilitated diffusion, with no visible restricted diffusion to suggest acute spinal cord infarct. 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 2. Postoperative changes from prior C3-C6 laminoplasty with C2 dome laminectomy, followed by subsequent epidural evacuation. Electronically Signed   By: Morene Hoard M.D.   On: 08/11/2023 03:26   MR CERVICAL SPINE WO  CONTRAST Result Date: 08/11/2023 CLINICAL DATA:  Initial evaluation for abnormal posture, DWI sequences only. EXAM: MRI CERVICAL, THORACIC AND LUMBAR SPINE WITHOUT CONTRAST TECHNIQUE: Multiplanar and multiecho pulse sequences of the cervical spine, to include the craniocervical junction and cervicothoracic junction, and thoracic and lumbar spine, were obtained without intravenous contrast. COMPARISON:  Prior MRI from 08/10/2023 as well as earlier studies. FINDINGS: MRI CERVICAL SPINE FINDINGS Alignment: Physiologic with preservation of the normal cervical lordosis. No interval malalignment. Vertebrae: Postoperative changes from prior posterior decompression at C3 through C6 with C2 dome laminectomy, better seen on prior exams. Vertebral body height maintained with no visible acute or interval fracture. Cord: Again seen is diffuse cord signal abnormality extending from the cervicomedullary junction through the upper cervical spinal cord. Associated cord swelling/edema. Region of signal abnormality demonstrates facilitated diffusion, with no restricted diffusion to suggest acute cord infarct. 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 Posterior Fossa, vertebral arteries, paraspinal tissues: Postoperative changes within the posterior paraspinous soft tissues. No visible loculated collections on this limited exam. Disc levels: Underlying cervical spondylosis, described on recent MRI. MRI THORACIC SPINE FINDINGS Alignment: Limited diffusion-weighted images of the thoracic spine were performed. Stable alignment with preservation of the normal thoracic kyphosis. No visible interval listhesis or malalignment. Vertebrae: Vertebral body  height grossly maintained with no visible acute or interval fracture. Cord: Limited diffusion-weighted sequences of the thoracic spinal cord only were obtained. Provided images are  degraded by motion and fairly limited. Previously seen diffuse cord signal abnormality extending through the majority of the thoracic cord to approximately the T11-12 level, corresponding with abnormality on prior MRI. Area demonstrates grossly facilitated diffusion throughout this region. No visible restricted diffusion to suggest spinal cord infarct. Given this, finding felt to be most consistent with acute spinal cord injury related to previously identified epidural hematoma and cord compression. Paraspinal and other soft tissues: No visible abnormality. Disc levels: Grossly stable from prior, described on previous exams. MRI LUMBAR SPINE FINDINGS Segmentation: Limited DWI sequences of the lumbar spine only were performed. Standard segmentation. Lowest well-formed disc space labeled the L5-S1 level. Alignment:  Physiologic. Vertebrae: Vertebral body height maintained without acute or interval fracture. Conus medullaris and cauda equina: Conus extends to the L1-2 level. Visualized conus and cauda equina grossly within normal limits. No restricted diffusion to suggest final cord infarct. Paraspinal and other soft tissues: No visible acute abnormality on this limited exam. Disc levels: Grossly stable, described on prior exam. IMPRESSION: 1. Diffuse cord signal abnormality extending from the cervicomedullary junction to approximately the T11-12 level, corresponding with abnormality seen on prior MRI from 08/10/2023. Region of signal abnormality demonstrates facilitated diffusion, with no visible restricted diffusion to suggest acute spinal cord infarct. 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 2. Postoperative changes from prior C3-C6 laminoplasty with C2 dome laminectomy, followed by subsequent epidural evacuation. Electronically Signed   By: Morene Hoard M.D.   On: 08/11/2023 03:26      Medications:    albumin  human 60 mL/hr at 08/12/23 9082   cefTAZidime  (FORTAZ )  IV 200 mL/hr at 08/12/23 1622   dexmedetomidine  (PRECEDEX ) IV infusion Stopped (08/12/23 0818)   feeding supplement (OSMOLITE 1.5 CAL)     phenylephrine  (NEO-SYNEPHRINE) Adult infusion 60 mcg/min (08/12/23 1622)   sodium bicarbonate  150 mEq in sterile water  1,150 mL infusion 75 mL/hr at 08/12/23 1622   vasopressin  0.03 Units/min (08/12/23 1622)    sodium chloride    Intravenous Once   Chlorhexidine  Gluconate Cloth  6 each Topical Daily   docusate  100 mg Per Tube BID   [START ON 08/13/2023] feeding supplement (PROSource TF20)  60 mL Per Tube Daily   free water   30 mL Per Tube Q4H   insulin  aspart  0-20 Units Subcutaneous Q4H   insulin  aspart  3 Units Subcutaneous Q4H   insulin  glargine-yfgn  5 Units Subcutaneous Daily   levothyroxine   88 mcg Per Tube QAC breakfast   mouth rinse  15 mL Mouth Rinse Q2H   pantoprazole  (PROTONIX ) IV  40 mg Intravenous Q12H   polyethylene glycol  17 g Per Tube Daily   sodium chloride  flush  10-40 mL Intracatheter Q12H   sodium chloride  flush  3-10 mL Intravenous Q12H   bisacodyl , fentaNYL  (SUBLIMAZE ) injection, fentaNYL  (SUBLIMAZE ) injection, menthol -cetylpyridinium **OR** [DISCONTINUED] phenol, mouth rinse, sodium chloride  flush, sodium chloride  flush  Assessment/ Plan:  68 y.o. female with a PMHx of acute incomplete quadriplegia, history of easy bleeding, central cord syndrome, diabetes mellitus type 2, diverticulosis, GERD, hypothyroidism, peripheral neuropathy, who was admitted to [facility] on 07/28/2023 for C2 dome laminectomy, C3-6 laminoplasty with postoperative bleeding and epidural hematoma causing spinal cord compression s/p evacuation 08/01/2023 who we are asked to see  now for acute kidney injury.   1.  Acute kidney injury/chronic kidney disease stage II baseline creatinine 0.75.  Suspect acute kidney injury now related to relative hypotension over the past several days.   BUN currently 105 with a creatinine 2.05.  Would recommend avoidance of contrast as possible to avoid extension of ATN.  2.  Anemia of chronic kidney disease.  Hemoglobin currently 5.4.  Being managed with blood transfusion at the moment.  3.  Acute respiratory failure.  Continue ventilatory support at this time.   LOS: 15 Harold Mattes 2/5/20255:27 PM

## 2023-08-12 NOTE — Progress Notes (Signed)
 Seen and examined.  She did have a drop in hemoglobin no clear evidence of ongoing bleeding.  She did have a CT that it was noncontrast I have personally reviewed this.  There is evidence of free fluid it seems to be pain and not consistent with blood.  The fact that she did not have any contrast significantly limits our assessment.  I do not think she is actively bleeding as her abdomen is not distended. Recommend transfusion. SHe does have chronic anemia and has multiple medical issues making her care complicated. We will continue to follow.

## 2023-08-13 ENCOUNTER — Inpatient Hospital Stay: Payer: Medicare Other

## 2023-08-13 DIAGNOSIS — Z7189 Other specified counseling: Secondary | ICD-10-CM | POA: Diagnosis not present

## 2023-08-13 DIAGNOSIS — G959 Disease of spinal cord, unspecified: Secondary | ICD-10-CM | POA: Diagnosis not present

## 2023-08-13 DIAGNOSIS — G9341 Metabolic encephalopathy: Secondary | ICD-10-CM | POA: Diagnosis not present

## 2023-08-13 DIAGNOSIS — M4804 Spinal stenosis, thoracic region: Secondary | ICD-10-CM | POA: Diagnosis not present

## 2023-08-13 LAB — CBC
HCT: 22.1 % — ABNORMAL LOW (ref 36.0–46.0)
Hemoglobin: 7.6 g/dL — ABNORMAL LOW (ref 12.0–15.0)
MCH: 29.9 pg (ref 26.0–34.0)
MCHC: 34.4 g/dL (ref 30.0–36.0)
MCV: 87 fL (ref 80.0–100.0)
Platelets: 107 10*3/uL — ABNORMAL LOW (ref 150–400)
RBC: 2.54 MIL/uL — ABNORMAL LOW (ref 3.87–5.11)
RDW: 15 % (ref 11.5–15.5)
WBC: 11.6 10*3/uL — ABNORMAL HIGH (ref 4.0–10.5)
nRBC: 0.3 % — ABNORMAL HIGH (ref 0.0–0.2)

## 2023-08-13 LAB — CBC WITH DIFFERENTIAL/PLATELET
Abs Immature Granulocytes: 0.06 10*3/uL (ref 0.00–0.07)
Basophils Absolute: 0 10*3/uL (ref 0.0–0.1)
Basophils Relative: 0 %
Eosinophils Absolute: 0 10*3/uL (ref 0.0–0.5)
Eosinophils Relative: 0 %
HCT: 18.9 % — ABNORMAL LOW (ref 36.0–46.0)
Hemoglobin: 6.5 g/dL — ABNORMAL LOW (ref 12.0–15.0)
Immature Granulocytes: 1 %
Lymphocytes Relative: 13 %
Lymphs Abs: 1.2 10*3/uL (ref 0.7–4.0)
MCH: 30.5 pg (ref 26.0–34.0)
MCHC: 34.4 g/dL (ref 30.0–36.0)
MCV: 88.7 fL (ref 80.0–100.0)
Monocytes Absolute: 0.7 10*3/uL (ref 0.1–1.0)
Monocytes Relative: 8 %
Neutro Abs: 6.8 10*3/uL (ref 1.7–7.7)
Neutrophils Relative %: 78 %
Platelets: 98 10*3/uL — ABNORMAL LOW (ref 150–400)
RBC: 2.13 MIL/uL — ABNORMAL LOW (ref 3.87–5.11)
RDW: 15.5 % (ref 11.5–15.5)
WBC: 8.7 10*3/uL (ref 4.0–10.5)
nRBC: 0.3 % — ABNORMAL HIGH (ref 0.0–0.2)

## 2023-08-13 LAB — PREPARE FRESH FROZEN PLASMA: Unit division: 0

## 2023-08-13 LAB — RENAL FUNCTION PANEL
Albumin: 2.9 g/dL — ABNORMAL LOW (ref 3.5–5.0)
Anion gap: 11 (ref 5–15)
BUN: 97 mg/dL — ABNORMAL HIGH (ref 8–23)
CO2: 22 mmol/L (ref 22–32)
Calcium: 8.1 mg/dL — ABNORMAL LOW (ref 8.9–10.3)
Chloride: 111 mmol/L (ref 98–111)
Creatinine, Ser: 1.77 mg/dL — ABNORMAL HIGH (ref 0.44–1.00)
GFR, Estimated: 32 mL/min — ABNORMAL LOW (ref >60)
Glucose, Bld: 166 mg/dL — ABNORMAL HIGH (ref 70–99)
Phosphorus: 4 mg/dL (ref 2.5–4.6)
Potassium: 3.9 mmol/L (ref 3.5–5.1)
Sodium: 144 mmol/L (ref 135–145)

## 2023-08-13 LAB — BPAM FFP
Blood Product Expiration Date: 202502102359
Blood Product Expiration Date: 202502102359
ISSUE DATE / TIME: 202502051442
ISSUE DATE / TIME: 202502052107
Unit Type and Rh: 6200
Unit Type and Rh: 6200

## 2023-08-13 LAB — GLUCOSE, CAPILLARY
Glucose-Capillary: 144 mg/dL — ABNORMAL HIGH (ref 70–99)
Glucose-Capillary: 149 mg/dL — ABNORMAL HIGH (ref 70–99)
Glucose-Capillary: 260 mg/dL — ABNORMAL HIGH (ref 70–99)
Glucose-Capillary: 261 mg/dL — ABNORMAL HIGH (ref 70–99)
Glucose-Capillary: 263 mg/dL — ABNORMAL HIGH (ref 70–99)
Glucose-Capillary: 86 mg/dL (ref 70–99)
Glucose-Capillary: 91 mg/dL (ref 70–99)

## 2023-08-13 LAB — MAGNESIUM: Magnesium: 2.2 mg/dL (ref 1.7–2.4)

## 2023-08-13 LAB — PREPARE RBC (CROSSMATCH)

## 2023-08-13 MED ORDER — SODIUM CHLORIDE 0.9% IV SOLUTION: Freq: Once | INTRAVENOUS | Status: AC

## 2023-08-13 MED ORDER — INSULIN ASPART 100 UNIT/ML IJ SOLN
0.0000 [IU] | INTRAMUSCULAR | Status: DC
  Administered 2023-08-13 (×2): 8 [IU] via SUBCUTANEOUS
  Administered 2023-08-14: 2 [IU] via SUBCUTANEOUS
  Administered 2023-08-14 (×2): 5 [IU] via SUBCUTANEOUS
  Administered 2023-08-14: 3 [IU] via SUBCUTANEOUS
  Administered 2023-08-14: 5 [IU] via SUBCUTANEOUS
  Administered 2023-08-14: 11 [IU] via SUBCUTANEOUS
  Administered 2023-08-15: 8 [IU] via SUBCUTANEOUS
  Administered 2023-08-15 (×4): 3 [IU] via SUBCUTANEOUS
  Administered 2023-08-16: 5 [IU] via SUBCUTANEOUS
  Administered 2023-08-16: 3 [IU] via SUBCUTANEOUS
  Administered 2023-08-16 (×2): 5 [IU] via SUBCUTANEOUS
  Administered 2023-08-16 (×2): 8 [IU] via SUBCUTANEOUS
  Administered 2023-08-17 (×2): 3 [IU] via SUBCUTANEOUS
  Administered 2023-08-17: 5 [IU] via SUBCUTANEOUS
  Administered 2023-08-17: 8 [IU] via SUBCUTANEOUS
  Administered 2023-08-17: 5 [IU] via SUBCUTANEOUS
  Administered 2023-08-17: 8 [IU] via SUBCUTANEOUS
  Administered 2023-08-18 (×2): 5 [IU] via SUBCUTANEOUS
  Administered 2023-08-18: 4 [IU] via SUBCUTANEOUS
  Administered 2023-08-18: 5 [IU] via SUBCUTANEOUS
  Administered 2023-08-18: 3 [IU] via SUBCUTANEOUS
  Administered 2023-08-18: 5 [IU] via SUBCUTANEOUS
  Administered 2023-08-19: 8 [IU] via SUBCUTANEOUS
  Administered 2023-08-19: 5 [IU] via SUBCUTANEOUS
  Filled 2023-08-13 (×32): qty 1

## 2023-08-13 NOTE — Plan of Care (Signed)
  Problem: Education: Goal: Ability to describe self-care measures that may prevent or decrease complications (Diabetes Survival Skills Education) will improve Outcome: Progressing   Problem: Coping: Goal: Ability to adjust to condition or change in health will improve Outcome: Progressing   Problem: Fluid Volume: Goal: Ability to maintain a balanced intake and output will improve Outcome: Progressing   Problem: Health Behavior/Discharge Planning: Goal: Ability to identify and utilize available resources and services will improve Outcome: Progressing Goal: Ability to manage health-related needs will improve Outcome: Progressing   Problem: Metabolic: Goal: Ability to maintain appropriate glucose levels will improve Outcome: Progressing   Problem: Nutritional: Goal: Maintenance of adequate nutrition will improve Outcome: Progressing Goal: Progress toward achieving an optimal weight will improve Outcome: Progressing   Problem: Skin Integrity: Goal: Risk for impaired skin integrity will decrease Outcome: Progressing   Problem: Tissue Perfusion: Goal: Adequacy of tissue perfusion will improve Outcome: Progressing   Problem: Education: Goal: Knowledge of General Education information will improve Description: Including pain rating scale, medication(s)/side effects and non-pharmacologic comfort measures Outcome: Progressing   Problem: Health Behavior/Discharge Planning: Goal: Ability to manage health-related needs will improve Outcome: Progressing   Problem: Clinical Measurements: Goal: Ability to maintain clinical measurements within normal limits will improve Outcome: Progressing Goal: Will remain free from infection Outcome: Progressing Goal: Diagnostic test results will improve Outcome: Progressing Goal: Respiratory complications will improve Outcome: Progressing Goal: Cardiovascular complication will be avoided Outcome: Progressing   Problem: Activity: Goal:  Risk for activity intolerance will decrease Outcome: Progressing   Problem: Nutrition: Goal: Adequate nutrition will be maintained Outcome: Progressing   Problem: Coping: Goal: Level of anxiety will decrease Outcome: Progressing   Problem: Elimination: Goal: Will not experience complications related to bowel motility Outcome: Progressing Goal: Will not experience complications related to urinary retention Outcome: Progressing   Problem: Pain Managment: Goal: General experience of comfort will improve and/or be controlled Outcome: Progressing   Problem: Safety: Goal: Ability to remain free from injury will improve Outcome: Progressing   Problem: Skin Integrity: Goal: Risk for impaired skin integrity will decrease Outcome: Progressing   Problem: Education: Goal: Ability to verbalize activity precautions or restrictions will improve Outcome: Progressing Goal: Knowledge of the prescribed therapeutic regimen will improve Outcome: Progressing Goal: Understanding of discharge needs will improve Outcome: Progressing   Problem: Activity: Goal: Ability to avoid complications of mobility impairment will improve Outcome: Progressing Goal: Ability to tolerate increased activity will improve Outcome: Progressing Goal: Will remain free from falls Outcome: Progressing   Problem: Bowel/Gastric: Goal: Gastrointestinal status for postoperative course will improve Outcome: Progressing   Problem: Clinical Measurements: Goal: Ability to maintain clinical measurements within normal limits will improve Outcome: Progressing Goal: Postoperative complications will be avoided or minimized Outcome: Progressing Goal: Diagnostic test results will improve Outcome: Progressing   Problem: Pain Management: Goal: Pain level will decrease Outcome: Progressing   Problem: Skin Integrity: Goal: Will show signs of wound healing Outcome: Progressing   Problem: Health Behavior/Discharge  Planning: Goal: Identification of resources available to assist in meeting health care needs will improve Outcome: Progressing   Problem: Bladder/Genitourinary: Goal: Urinary functional status for postoperative course will improve Outcome: Progressing   Problem: Activity: Goal: Ability to tolerate increased activity will improve Outcome: Progressing   Problem: Respiratory: Goal: Ability to maintain a clear airway and adequate ventilation will improve Outcome: Progressing   Problem: Role Relationship: Goal: Method of communication will improve Outcome: Progressing

## 2023-08-13 NOTE — Progress Notes (Signed)
 Daily Progress Note   Patient Name: [name]       Date: 08/13/2023 DOB: September 05, 1955  Age: 68 y.o. MRN#: [id number] Attending Physician: [name] ORN, MD Primary Care Physician: Valora Agent, MD Admit Date: 07/28/2023  Reason for Consultation/Follow-up: Establishing goals of care  Subjective: Notes and labs reviewed.  In to see patient. Patient is resting in bed on ventilator with husband and son at bedside.  She is able to open her eyes spontaneously.  Husband states he has been speaking with the medical team and is encouraged by her ability to awaken and lift her arm.  He states the decision has been made to proceed with tracheostomy.  Patient currently has a PEG in place.  With discussing CODE STATUS, husband states he will continue full CODE STATUS at this time.    Attempted to determine patient's wishes.  Patient is able to shake and nod her head, but responses are not reliable as when being asked the same question, she gives different responses.    Length of Stay: 16  Current Medications: Scheduled Meds:  . sodium chloride    Intravenous Once  . Chlorhexidine  Gluconate Cloth  6 each Topical Daily  . docusate  100 mg Per Tube BID  . feeding supplement (PROSource TF20)  60 mL Per Tube Daily  . free water   30 mL Per Tube Q4H  . insulin  aspart  0-15 Units Subcutaneous Q4H  . insulin  aspart  3 Units Subcutaneous Q4H  . insulin  glargine-yfgn  5 Units Subcutaneous Daily  . levothyroxine   88 mcg Per Tube QAC breakfast  . mouth rinse  15 mL Mouth Rinse Q2H  . pantoprazole  (PROTONIX ) IV  40 mg Intravenous Q12H  . polyethylene glycol  17 g Per Tube Daily  . sodium chloride  flush  10-40 mL Intracatheter Q12H  . sodium chloride  flush  3-10 mL Intravenous Q12H    Continuous  Infusions: . albumin  human 12.5 g (08/13/23 0949)  . cefTAZidime  (FORTAZ )  IV Stopped (08/13/23 0358)  . dexmedetomidine  (PRECEDEX ) IV infusion Stopped (08/12/23 0818)  . feeding supplement (OSMOLITE 1.5 CAL) 30 mL/hr at 08/13/23 1045  . phenylephrine  (NEO-SYNEPHRINE) Adult infusion 20 mcg/min (08/13/23 1045)  . sodium bicarbonate  150 mEq in sterile water  1,150 mL infusion 75 mL/hr at 08/13/23 1045  . vasopressin  0.03 Units/min (08/13/23 1045)  PRN Meds: bisacodyl , fentaNYL  (SUBLIMAZE ) injection, fentaNYL  (SUBLIMAZE ) injection, menthol -cetylpyridinium **OR** [DISCONTINUED] phenol, mouth rinse, sodium chloride  flush, sodium chloride  flush  Physical Exam Constitutional:      Comments: Patient attempts to open eyes  Pulmonary:     Comments: On ventilator support.            Vital Signs: BP (!) 121/52 (BP Location: Right Leg)   Pulse (!) 57   Temp (!) 95.2 F (35.1 C)   Resp 18   Ht 5' 7 (1.702 m)   Wt 64.9 kg   SpO2 96%   BMI 22.41 kg/m  SpO2: SpO2: 96 % O2 Device: O2 Device: Ventilator O2 Flow Rate: O2 Flow Rate (L/min): 45 L/min  Intake/output summary:  Intake/Output Summary (Last 24 hours) at 08/13/2023 1300 Last data filed at 08/13/2023 1045 Gross per 24 hour  Intake 3935.81 ml  Output 1245 ml  Net 2690.81 ml   LBM: Last BM Date : 08/12/23 Baseline Weight: Weight: 58.9 kg Most recent weight: Weight: 64.9 kg   Patient Active Problem List   Diagnosis Date Noted  . Toxic metabolic encephalopathy 08/12/2023  . Myelopathy of thoracic region 08/04/2023  . Hematoma 08/04/2023  . Spinal stenosis, thoracic 08/04/2023  . Malnutrition of moderate degree 08/03/2023  . Lobar pneumonia (HCC) 08/01/2023  . MRSA (methicillin resistant staph aureus) culture positive 08/01/2023  . Cervical myelopathy (HCC) 07/28/2023  . Spinal stenosis in cervical region 06/18/2023  . Spinal cord injury at C5-C7 level without injury of spinal bone (HCC) 06/18/2023  . Arm weakness 06/18/2023   . Numbness and tingling 06/18/2023  . Spasticity 06/15/2023  . Orthostatic hypotension 06/15/2023  . Esophageal stricture 05/01/2023  . Dysphagia 04/30/2023  . Odynophagia 04/30/2023  . Gastroesophageal reflux disease 04/30/2023  . Acute incomplete quadriplegia (HCC) 04/23/2023  . Spinal cord injury, cervical region (HCC) 04/22/2023  . Spinal cord compression, post-traumatic (HCC) 04/13/2023  . Central cord syndrome (HCC) 04/13/2023  . Muscle weakness of right upper extremity 04/13/2023  . Weakness of right lower extremity 04/13/2023  . Pelvic fracture (HCC) 04/13/2023  . Cellulitis 08/12/2021  . Facial cellulitis 08/11/2021  . Dental caries 08/11/2021  . COVID-19 virus infection 08/11/2021  . Diabetes mellitus without complication (HCC)   . Hypothyroidism   . Stage 3a chronic kidney disease (HCC)   . Non-dose-related adverse reaction to medication   . Post-operative state 03/16/2017  . AKI (acute kidney injury) (HCC) 01/18/2017  . Acute hypoxic respiratory failure (HCC) 11/04/2015    Palliative Care Assessment & Plan     Recommendations/Plan: Patient has PEG in place.  Per husband decision has been made to proceed with tracheostomy.  Code Status:    Code Status Orders  (From admission, onward)           Start     Ordered   07/28/23 1459  Full code  Continuous       Question:  By:  Answer:  Procedural case: previous code status reviewed   07/28/23 1458           Code Status History     Date Active Date Inactive Code Status Order ID Comments User Context   04/22/2023 1239 05/12/2023 1531 Full Code [id number]  Rosendo Nena JINNY DEVONNA Inpatient   04/22/2023 1239 04/22/2023 1239 Full Code [id number]  Rosendo Nena JINNY DEVONNA Inpatient   04/13/2023 1640 04/22/2023 1237 Full Code [id number]  Marca Maude POUR, MD ED   04/13/2023 1629 04/13/2023 1640 Full Code [id number]  Isaiah Scrivener,  MD ED   08/11/2021 2222 08/16/2021 1950 Full Code [id number]  Cleatus Delayne GAILS, MD ED    03/16/2017 1059 03/17/2017 1935 Full Code [id number]  Schermerhorn, Debby PARAS, MD Inpatient   01/18/2017 1745 01/19/2017 2033 Full Code [id number]  Tobie Calix, MD ED   11/04/2015 1219 11/06/2015 2047 Full Code [id number]  Almeda Bernard, MD ED       Prognosis:  Unable to determine   Thank you for allowing the Palliative Medicine Team to assist in the care of this patient.    Camelia Lewis, NP  Please contact Palliative Medicine Team phone at [phone] for questions and concerns.

## 2023-08-13 NOTE — Plan of Care (Signed)
  Problem: Fluid Volume: Goal: Ability to maintain a balanced intake and output will improve Outcome: Progressing   Problem: Metabolic: Goal: Ability to maintain appropriate glucose levels will improve Outcome: Progressing   Problem: Nutritional: Goal: Maintenance of adequate nutrition will improve Outcome: Progressing   Problem: Skin Integrity: Goal: Risk for impaired skin integrity will decrease Outcome: Progressing   Problem: Tissue Perfusion: Goal: Adequacy of tissue perfusion will improve Outcome: Progressing   Problem: Clinical Measurements: Goal: Ability to maintain clinical measurements within normal limits will improve Outcome: Progressing Goal: Will remain free from infection Outcome: Progressing Goal: Diagnostic test results will improve Outcome: Progressing   Problem: Nutrition: Goal: Adequate nutrition will be maintained Outcome: Progressing   Problem: Coping: Goal: Level of anxiety will decrease Outcome: Progressing

## 2023-08-13 NOTE — Progress Notes (Signed)
 Seen and examined. DOing well form abd perspective. SOft, incision cd/I. Tolerating TF. We will be available. Staples to dc in 2 weeks

## 2023-08-13 NOTE — Progress Notes (Signed)
 Neurosurgery Progress Note  History: [name] is here for C2 dome laminectomy, C3-6 laminoplasty and now s/p evacuation hematoma  POD15/12: Pt more interactive per family at bedside and CC team POD14/11: PEG placed yesterday. Pt with worsening anemia. Neurologically unchanged  POD13/10: MRI this morning does not appear to be consistent with spinal cord infarct.  Patient's neurologic exam is unchanged. POD12/9: Pt had neuro change early this morning prompting CT head and C spine.  POD 11/7: Was extubated this morning prior to evaluation.  Was able to track with eyes but was quite somnolent still.  Was reacting to painful stimuli in the bilateral lower extremities.  Left upper extremity was reacting to painful stimuli as well. POD 10/6: Pt experiencing increased work of breathing this morning and worsening findings on chest xray resulting in reintubation this morning. She is currently undergoing bronchoscopy by Dr. Aleskerov  POD 9/5: Pt more alert this morning asking what day it is  POD 8/4: Very drowsy but arouses this morning to voice.  POD 7/3: Pt is very drowsy, but able to follow some commands  POD6/2: Pt is drowsy this morning asking what do you want when awaken  Interval POD5: [name] had a decline yesterday with her left upper extremity and then bilateral lower extremities.  CT scan was unremarkable MRI did show a large epidural hematoma.  She was taken emergently to the OR by Dr. Claudene for decompression.  She has been monitored in the ICU overnight. She is still intubated and on sedation. She is on pressors to maintain [facility]   Hospital Course POD4: [name] is more awake today. She states she isnt having a lot of pain but she is thirsty.  POD3:Patient more alert this AM. However; hypoxia, tachycardia, SOB, low energy.  POD2: Very lethargic and difficult to arouse, states in quite a bit of pain. Medicine had been held due to sleepiness.    Physical Exam: Vitals:    08/13/23 0815 08/13/23 0830  BP:  (!) 135/47  Pulse: (!) 56 (!) 55  Resp: 20 18  Temp:    SpO2: 97% 96%    Intubated. Opens eyes to voice 1/5 strength in bilateral biceps. No appreciable movement in BLE  Data:  Output by Drain (mL) 08/11/23 0701 - 08/11/23 1900 08/11/23 1901 - 08/12/23 0700 08/12/23 0701 - 08/12/23 1900 08/12/23 1901 - 08/13/23 0700 08/13/23 0701 - 08/13/23 0855  Requested LDAs do not have output data documented.     Assessment/Plan:  [name]  s/p C2 dome laminectomy, C3-6 laminoplasty  On 1/21 with return to OR on 1/25 for cervical/thoracic laminectomy and epidural hematoma evacuation.  She has had a complicated postoperative course with a spontaneous cervical thoracic hemorrhage on 125 which was taken emergently for decompression.  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  - MRI of neuro axis 08/10/23 showing diffuse central spinal cord swelling concerning for spinal cord infarct.  DWI series repeated 2/4  which did not show restricted diffusion.  - pain control continue with IV and oral medications -Given her immobilized status and risk for DVT PE we are resuming anticoagulation knowing that she would be at risk for repeat hemorrhage.  - PT/OT when able - HV removed on 1/29 - pneumonia; recommendations per CC  - EEG showing moderate to severe encephalopathy  - CC and IM assisting with non surgical management. We appreciate your  assistance.   Edsel Goods PA-C Department of Neurosurgery

## 2023-08-13 NOTE — Progress Notes (Addendum)
 Nutrition Follow Up Note   DOCUMENTATION CODES:   Non-severe (moderate) malnutrition in context of chronic illness  INTERVENTION:   Osmolite 1.5@60ml /hr- Continue increasing by 10ml/hr q 8 hours until goal rate is reached.   ProSource TF 20- Give 60ml daily via tube, each supplement provides 80kcal and 20g of protein.   Free water  flushes 30ml q4 hours to maintain tube patency   Regimen provides 2240kcal/day, 110g/day protein and 1277ml/day of free water .   Daily weights   NUTRITION DIAGNOSIS:   Moderate Malnutrition related to chronic illness as evidenced by mild fat depletion, moderate fat depletion, mild muscle depletion, severe muscle depletion. -ongoing   GOAL:   Provide needs based on ASPEN/SCCM guidelines -previously met with TPN, progressing with tube feeds   MONITOR:   Vent status, Labs, Weight trends, TF tolerance, Skin, I & O's  ASSESSMENT:   68 y/o female with h/o DM, CKD III, thyroid cancer s/p total thyroidectomy 01/2023, GERD, diverticulitis, esophageal stricture s/p dilation 04/2022 who is admitted with cervical central cord injury after a trauma last year now s/p elective posterior cervical laminoplasty C3-6 and C2 inferior dome laminectomy 1/21 complicated by HCAP, AKI and epidural hematoma s/p emergent cervical decompression and evacuation 1/26.  Pt s/p surgical G-tube placement 2/5.   Pt remains sedated and ventilated. TPN discontinued. Pt is tolerating tube feeds well and is advancing towards goal rate. Family at bedside today and feels patient is more alert. Plan is for tracheostomy next week. Per chart, pt is up ~13lbs from her UBW. Pt +12.4L on her I & Os. Pt will likely need LTACH per MD.   Medications reviewed and include: colace, insulin , synthroid , protonix , miralax , albumin , fortaz , phenylephrine , Na Bicarbonate, vasopressin    Labs reviewed: K 3.9 wnl, BUN 97(H), creat 1.77H), P 4.0 wnl, Mg 2.2 wnl Wbc- 11.6(H), Hgb 7.6(L), Hct 22.1(L) Cbgs-  149, 86, 144 x 24 hrs  Patient is currently intubated on ventilator support MV: 10.2 L/min Temp (24hrs), Avg:96.6 F (35.9 C), Min:94.1 F (34.5 C), Max:97.9 F (36.6 C)  MAP- >51mmHg   UOP-   Diet Order:   Diet Order             Diet NPO time specified  Diet effective now                  EDUCATION NEEDS:   Not appropriate for education at this time  Skin:  Skin Assessment: Reviewed RN Assessment (incision back and neck)  Last BM:  2/5- type 7  Height:   Ht Readings from Last 1 Encounters:  07/28/23 5' 7 (1.702 m)    Weight:   Wt Readings from Last 1 Encounters:  08/13/23 64.9 kg    Ideal Body Weight:  61.36 kg  BMI:  Body mass index is 22.41 kg/m.  Estimated Nutritional Needs:   Kcal:  1900-2200kcal/day  Protein:  95-110g/day  Fluid:  1.8-2.1L/day  Augustin Shams MS, RD, LDN If unable to be reached, please send secure chat to RD inpatient available from 8:00a-4:00p daily

## 2023-08-13 NOTE — Progress Notes (Signed)
 [facility]  ROUNDING NOTE   Subjective:   Renal function improved bit.  BUN down to 97 with a creatinine of 1.7. Urine output reasonable at 1.7 L over the preceding 24 hours. Remains on the ventilator however.  Objective:  Vital signs in last 24 hours:  Temp:  [96.4 F (35.8 C)-98.3 F (36.8 C)] 97.2 F (36.2 C) (02/06 0400) Pulse Rate:  [50-81] 54 (02/06 0630) Resp:  [8-28] 16 (02/06 0630) BP: (118-164)/(28-71) 156/60 (02/06 0600) SpO2:  [93 %-100 %] 98 % (02/06 0736) Arterial Line BP: (107-174)/(41-63) 122/44 (02/06 0630) FiO2 (%):  [30 %] 30 % (02/06 0736) Weight:  [64.9 kg] 64.9 kg (02/06 0500)  Weight change: 0.8 kg Filed Weights   08/11/23 0320 08/12/23 0500 08/13/23 0500  Weight: 63.3 kg 64.1 kg 64.9 kg    Intake/Output: I/O last 3 completed shifts: In: 6355.7 [I.V.:4332.1; Blood:1085; NG/GT:588.5; IV Piggyback:350] Out: 2740 [Urine:2740]   Intake/Output this shift:  Total I/O In: 120.9 [I.V.:89.9; NG/GT:31] Out: -   Physical Exam: General: Critically ill-appearing  Head: Normocephalic, atraumatic.  Endotracheal tube in place  Neck: Supple  Lungs:  Scattered rhonchi, vent assisted  Heart: S1S2 no rubs  Abdomen:  Soft, nontender, bowel sounds present  Extremities: Trace peripheral edema.  Neurologic: Intubated on the ventilator  Skin: No acute rash  Access: No hemodialysis access    Basic Metabolic Panel: Recent Labs  Lab 08/09/23 0351 08/10/23 0333 08/11/23 0351 08/12/23 0405 08/13/23 0440  NA 146* 142 141 140 144  K 4.0 4.6 4.3 4.6 3.9  CL 125* 119* 115* 114* 111  CO2 12* 14* 14* 16* 22  GLUCOSE 128* 174* 271* 253* 166*  BUN 68* 80* 95* 105* 97*  CREATININE 0.75 1.56* 1.92* 2.05* 1.77*  CALCIUM  8.7* 8.2* 8.0* 8.4* 8.1*  MG 1.6* 2.7* 2.3 2.3 2.2  PHOS 2.5 4.0 3.8 3.5 4.0    Liver Function Tests: Recent Labs  Lab 08/08/23 0405 08/09/23 0351 08/10/23 0333 08/12/23 0405 08/13/23 0440  AST  --   --  25  --   --   ALT   --   --  14  --   --   ALKPHOS  --   --  59  --   --   BILITOT  --   --  0.6  --   --   PROT  --   --  6.0*  --   --   ALBUMIN  3.4* 3.1* 3.2* 2.6* 2.9*   No results for input(s): LIPASE, AMYLASE in the last 168 hours. No results for input(s): AMMONIA in the last 168 hours.  CBC: Recent Labs  Lab 08/10/23 0333 08/11/23 0351 08/12/23 0405 08/12/23 0759 08/12/23 1758 08/13/23 0440  WBC 24.4* 12.4* 15.5*  --  14.1* 11.6*  NEUTROABS  --   --   --   --  10.8*  --   HGB 8.9* 7.3* 5.5* 5.4* 8.2* 7.6*  HCT 27.0* 22.3* 16.1* 15.9* 23.6* 22.1*  MCV 97.1 92.1 89.0  --  87.4 87.0  PLT 188 116* 151  --  119* 107*    Cardiac Enzymes: No results for input(s): CKTOTAL, CKMB, CKMBINDEX, TROPONINI in the last 168 hours.  BNP: Invalid input(s): POCBNP  CBG: Recent Labs  Lab 08/12/23 1628 08/12/23 1941 08/12/23 2322 08/12/23 2357 08/13/23 0316  GLUCAP 163* 72 64* 144* 144*    Microbiology: Results for orders placed or performed during the hospital encounter of 07/28/23  MRSA Next Gen by PCR, Nasal  Status: Abnormal   Collection Time: 08/01/23  5:00 AM   Specimen: Nasal Mucosa; Nasal Swab  Result Value Ref Range Status   MRSA by PCR Next Gen DETECTED (A) NOT DETECTED Final    Comment: RESULT CALLED TO, READ BACK BY AND VERIFIED WITH: JOAN WILLIS @0923  08/01/23 MJU (NOTE) The GeneXpert MRSA Assay (FDA approved for NASAL specimens only), is one component of a comprehensive MRSA colonization surveillance program. It is not intended to diagnose MRSA infection nor to guide or monitor treatment for MRSA infections. Test performance is not FDA approved in patients less than 87 years old. Performed at [facility], [address] [place], [place] [address]   Culture, BAL-quantitative w Gram Stain     Status: Abnormal   Collection Time: 08/07/23 11:57 AM   Specimen: Bronchoalveolar Lavage; Respiratory  Result Value Ref Range Status   Specimen Description    Final    BRONCHIAL ALVEOLAR LAVAGE Performed at [facility], [address], [place], [place] [address]    Special Requests   Final    NONE Performed at [facility], [address] [place], [place] [address]    Gram Stain   Final    MODERATE WBC PRESENT,BOTH PMN AND MONONUCLEAR FEW YEAST Performed at [facility], [address], [place], [place] [address]    Culture 30,000 COLONIES/mL CANDIDA GLABRATA (A)  Final   Report Status 08/10/2023 FINAL  Final  Culture, BAL-quantitative w Gram Stain     Status: Abnormal   Collection Time: 08/09/23 10:36 AM   Specimen: Bronchoalveolar Lavage; Respiratory  Result Value Ref Range Status   Specimen Description   Final    BRONCHIAL ALVEOLAR LAVAGE Performed at [facility], [address] [place], [place] [address]    Special Requests   Final    NONE Performed at [facility], [address] [place], [place] [address]    Gram Stain   Final    MODERATE WBC PRESENT,BOTH PMN AND MONONUCLEAR RARE GRAM POSITIVE COCCI IN PAIRS FEW YEAST Performed at [facility], [address], [place], [place] [address]    Culture 80,000 COLONIES/mL CANDIDA GLABRATA (A)  Final   Report Status 08/12/2023 FINAL  Final  Culture, blood (Routine X 2) w Reflex to ID Panel     Status: None (Preliminary result)   Collection Time: 08/10/23 11:45 AM   Specimen: BLOOD RIGHT ARM  Result Value Ref Range Status   Specimen Description BLOOD RIGHT ARM  Final   Special Requests   Final    BOTTLES DRAWN AEROBIC AND ANAEROBIC Blood Culture adequate volume   Culture   Final    NO GROWTH 2 DAYS Performed at [facility], [address], [place], [place] [address]    Report Status PENDING  Incomplete  Culture, blood (Routine X 2) w Reflex to ID Panel     Status: None (Preliminary result)   Collection Time: 08/10/23 11:46 AM   Specimen: BLOOD LEFT HAND  Result Value Ref Range Status   Specimen  Description BLOOD LEFT HAND  Final   Special Requests   Final    BOTTLES DRAWN AEROBIC AND ANAEROBIC Blood Culture adequate volume   Culture   Final    NO GROWTH 2 DAYS Performed at [facility], [address] [place], [place] [address]    Report Status PENDING  Incomplete    Coagulation Studies: No results for input(s): LABPROT, INR in the last 72  hours.  Urinalysis: No results for input(s): COLORURINE, LABSPEC, PHURINE, GLUCOSEU, HGBUR, BILIRUBINUR, KETONESUR, PROTEINUR, UROBILINOGEN, NITRITE, LEUKOCYTESUR in the last 72 hours.  Invalid input(s): APPERANCEUR    Imaging: EEG adult Result Date: 08/12/2023 Shelton Arlin KIDD, MD     08/12/2023  5:45 PM Patient Name: [name] MRN: [id number] Epilepsy Attending: Arlin KIDD Shelton Referring Physician/Provider: [name] ORN, MD Date: 08/12/2023 Duration: 34.12 mins Patient history: 67yo f with ams getting eeg to evaluate for seizure Level of alertness:  comatose/ lethargic AEDs during EEG study: None Technical aspects: This EEG study was done with scalp electrodes positioned according to the 10-20 International system of electrode placement. Electrical activity was reviewed with band pass filter of 1-70Hz , sensitivity of 7 uV/mm, display speed of 25mm/sec with a 60Hz  notched filter applied as appropriate. EEG data were recorded continuously and digitally stored.  Video monitoring was available and reviewed as appropriate. Description: EEG showed continuous generalized 5 to 7 Hz theta slowing admixed with intermittent 2-3hz  delta slowing. Generalized periodic discharges with triphasic morphology were noted at 0.5-1hz  intermittently.  Hyperventilation and photic stimulation were not performed.   ABNORMALITY - Periodic discharges with triphasic morphology, generalized - Continuous slow, generalized IMPRESSION: This study is suggestive of moderate to severe diffuse encephalopathy likely secondary to  toxic-metabolic causes. No seizures or definite epileptiform discharges were seen throughout the recording. Arlin KIDD Shelton   CT ABDOMEN PELVIS WO CONTRAST Result Date: 08/12/2023 CLINICAL DATA:  68 year old female with sepsis, query abdominal or pelvic hemorrhage on noncontrast exam. Postoperative day 1 status post surgically placed gastrostomy tube. EXAM: CT ABDOMEN AND PELVIS WITHOUT CONTRAST TECHNIQUE: Multidetector CT imaging of the abdomen and pelvis was performed following the standard protocol without IV contrast. RADIATION DOSE REDUCTION: This exam was performed according to the departmental dose-optimization program which includes automated exposure control, adjustment of the mA and/or kV according to patient size and/or use of iterative reconstruction technique. COMPARISON:  Noncontrast CT Abdomen and Pelvis 08/10/2023. FINDINGS: Lower chest: Unresolved confluent left lung base opacity which is partially visible, small superimposed left pleural effusion. This most resemble pneumonia on chest CT last month. Contralateral right lung base with minimal dependent atelectasis. No pericardial or right pleural effusion. Hepatobiliary: Motion artifact. Partially contracted gallbladder. Cholelithiasis suspected. Grossly stable noncontrast liver. Pancreas: Stable. Spleen: New perisplenic fluid although with seemingly simple fluid density on series 2, image 11. Motion artifact. Adrenals/Urinary Tract: Normal adrenal glands. Chronic left renal atrophy. Noncontrast right kidney and ureter appear stable. Foley catheter within the bladder which is not fully decompressed. Retroperitoneal spaces, psoas muscles appears stable. Stomach/Bowel: New percutaneous gastrostomy and midline abdominal skin staples. Small volume of postoperative pneumoperitoneum in the upper abdomen. Small volume retained fluid in the stomach. Motion artifact. No dilated large or small bowel loops. The proximal ascending colon abnormality described  on 08/10/2023 is not hree identified but could be obscured today. Large bowel diverticulosis. Vascular/Lymphatic: Vascular patency is not evaluated in the absence of IV contrast. Aortoiliac calcified atherosclerosis. Normal caliber abdominal aorta. No lymphadenopathy identified. 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 Diminutive or absent ovaries. Other: Moderate to large volume of mostly new pelvic free fluid, although has simple fluid density (series 2, image 63). Musculoskeletal: No acute osseous abnormality identified. Some generalized body wall edema which appears increased. IMPRESSION: 1. New free fluid in the abdomen and pelvis, including adjacent to the spleen, and Moderate Volume overall. Hemoperitoneum cannot be excluded, although the fluid has fairly simple density favoring ascites. 2. Postoperative day 1  from surgical G-tube placement, no gastrostomy tube adverse features. Small volume postoperative pneumoperitoneum. 3. No retroperitoneal or other acute hemorrhage identified in the noncontrast abdomen or pelvis. 4. Unresolved left lung base pneumonia. Stable small left pleural effusion. Cholelithiasis. Large bowel diverticulosis. Chronic left renal atrophy. Electronically Signed   By: VEAR Hurst M.D.   On: 08/12/2023 11:20     Medications:    albumin  human Stopped (08/12/23 1015)   cefTAZidime  (FORTAZ )  IV Stopped (08/13/23 0358)   dexmedetomidine  (PRECEDEX ) IV infusion Stopped (08/12/23 0818)   feeding supplement (OSMOLITE 1.5 CAL) 30 mL/hr at 08/13/23 0715   phenylephrine  (NEO-SYNEPHRINE) Adult infusion 20 mcg/min (08/13/23 0715)   sodium bicarbonate  150 mEq in sterile water  1,150 mL infusion 75 mL/hr at 08/13/23 0715   vasopressin  0.03 Units/min (08/13/23 0715)    sodium chloride    Intravenous Once   Chlorhexidine  Gluconate Cloth  6 each Topical Daily   docusate  100 mg Per Tube BID   feeding supplement (PROSource TF20)  60 mL Per Tube Daily   free water   30 mL Per Tube  Q4H   insulin  aspart  0-20 Units Subcutaneous Q4H   insulin  aspart  3 Units Subcutaneous Q4H   insulin  glargine-yfgn  5 Units Subcutaneous Daily   levothyroxine   88 mcg Per Tube QAC breakfast   mouth rinse  15 mL Mouth Rinse Q2H   pantoprazole  (PROTONIX ) IV  40 mg Intravenous Q12H   polyethylene glycol  17 g Per Tube Daily   sodium chloride  flush  10-40 mL Intracatheter Q12H   sodium chloride  flush  3-10 mL Intravenous Q12H   bisacodyl , fentaNYL  (SUBLIMAZE ) injection, fentaNYL  (SUBLIMAZE ) injection, menthol -cetylpyridinium **OR** [DISCONTINUED] phenol, mouth rinse, sodium chloride  flush, sodium chloride  flush  Assessment/ Plan:  68 y.o. female with a PMHx of acute incomplete quadriplegia, history of easy bleeding, central cord syndrome, diabetes mellitus type 2, diverticulosis, GERD, hypothyroidism, peripheral neuropathy, who was admitted to [facility] on 07/28/2023 for C2 dome laminectomy, C3-6 laminoplasty with postoperative bleeding and epidural hematoma causing spinal cord compression s/p evacuation 08/01/2023 who we are asked to see now for acute kidney injury.   1.  Acute kidney injury/chronic kidney disease stage II baseline creatinine 0.75.  Suspect acute kidney injury now related to relative hypotension over the past several days.  BUN down to 97 and creatinine down to 1.7.  Continue IV fluid hydration for now.  2.  Anemia of chronic kidney disease.  Hemoglobin up to 7.6 posttransfusion.  Continue to monitor CBC closely.  3.  Acute respiratory failure.  Patient maintained on ventilatory support.  Weaning as per pulmonary/critical care.   LOS: 16 Rasmus Preusser 2/6/20257:46 AM

## 2023-08-13 NOTE — Consult Note (Signed)
 ..[name], [name] [id number] Nov 10, 1955 [name] ORN, MD  Reason for Consult: tracheostomy tube placement  HPI: 68 year old female admitted following a posterior laminectomy/laminoplasty for spinal stenosis.  Post-operatively had a hematoma and required emergent evacuation on POD5.  Extubated and had to be reintubated x2.  Continued weakness has been noted and the feeling is that long term ventilation support is needed.  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  Allergies:  Allergies  Allergen Reactions   Amoxicillin -Pot Clavulanate Itching and Anaphylaxis   Ampicillin Rash    ROS: Review of systems normal other than 12 systems except per HPI.  PMH:  Past Medical History:  Diagnosis Date   Acute incomplete quadriplegia (HCC)    Acute respiratory failure with hypoxia (HCC)    AKI (acute kidney injury) (HCC)    Arthritis    Blood dyscrasia    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   Central cord syndrome (HCC)    Dental caries    Diabetes mellitus without complication (HCC)    Diverticulosis    Dysphagia    Esophageal stricture    Facial cellulitis    GERD (gastroesophageal reflux disease)    Hypothyroidism    Neuropathy    Odynophagia    Pelvic fracture (HCC)    Renal insufficiency    Spinal cord compression, post-traumatic (HCC)    Spinal cord injury, cervical region (HCC)    Stage 3a chronic kidney disease (CKD) (HCC)     FH:  Family History  Problem Relation Age of Onset   Breast cancer Maternal Aunt    Hypertension Mother    Hypertension Father    Diabetes Father     SH:  Social History   Socioeconomic History   Marital status: Married    Spouse name: Evalene   Number of children: Not on file   Years of education: Not on file   Highest  education level: Not on file  Occupational History   Not on file  Tobacco Use   Smoking status: Never   Smokeless tobacco: Never  Vaping Use   Vaping status: Never Used  Substance and Sexual Activity   Alcohol use: No   Drug use: No   Sexual activity: Not Currently  Other Topics Concern   Not on file  Social History Narrative   Not on file   Social Drivers of Health   Financial Resource Strain: Not on file  Food Insecurity: No Food Insecurity (07/28/2023)   Hunger Vital Sign    Worried About Running Out of Food in the Last Year: Never true    Ran Out of Food in the Last Year: Never true  Transportation Needs: No Transportation Needs (07/28/2023)   PRAPARE - Administrator, Civil Service (Medical): No    Lack of Transportation (Non-Medical): No  Physical Activity: Not on file  Stress: Not on file  Social Connections: Moderately Integrated (07/28/2023)   Social Connection and Isolation Panel [NHANES]    Frequency of Communication with Friends and Family: Once a week    Frequency of Social Gatherings with Friends and Family: Once a week    Attends Religious Services: 1 to 4 times per year    Active Member of [employer] or Organizations: Yes    Attends Banker Meetings:  1 to 4 times per year    Marital Status: Married  Catering Manager Violence: Not At Risk (07/28/2023)   Humiliation, Afraid, Rape, and Kick questionnaire    Fear of Current or Ex-Partner: No    Emotionally Abused: No    Physically Abused: No    Sexually Abused: No    PSH:  Past Surgical History:  Procedure Laterality Date   ABDOMINAL HYSTERECTOMY     BILATERAL SALPINGECTOMY Bilateral 03/16/2017   Procedure: BILATERAL SALPINGECTOMY;  Surgeon: Schermerhorn, Debby PARAS, MD;  Location: [facility] ORS;  Service: Gynecology;  Laterality: Bilateral;   BIOPSY  05/03/2023   Procedure: BIOPSY;  Surgeon: Leigh Elspeth SQUIBB, MD;  Location: [facility] ENDOSCOPY;  Service: Gastroenterology;;   COLONOSCOPY WITH  PROPOFOL  N/A 08/18/2018   Procedure: COLONOSCOPY WITH PROPOFOL ;  Surgeon: Viktoria Lamar DASEN, MD;  Location: [facility]COPY;  Service: Endoscopy;  Laterality: N/A;   CYSTOCELE REPAIR N/A 03/16/2017   Procedure: ANTERIOR REPAIR (CYSTOCELE);  Surgeon: Schermerhorn, Debby PARAS, MD;  Location: [facility] ORS;  Service: Gynecology;  Laterality: N/A;   ESOPHAGOGASTRODUODENOSCOPY (EGD) WITH PROPOFOL  N/A 08/18/2018   Procedure: ESOPHAGOGASTRODUODENOSCOPY (EGD) WITH PROPOFOL ;  Surgeon: Viktoria Lamar DASEN, MD;  Location: [facility] ENDOSCOPY;  Service: Endoscopy;  Laterality: N/A;   ESOPHAGOGASTRODUODENOSCOPY (EGD) WITH PROPOFOL  Left 05/01/2023   Procedure: ESOPHAGOGASTRODUODENOSCOPY (EGD) WITH PROPOFOL ;  Surgeon: Leigh Elspeth SQUIBB, MD;  Location: [facility] ENDOSCOPY;  Service: Gastroenterology;  Laterality: Left;   ESOPHAGOGASTRODUODENOSCOPY (EGD) WITH PROPOFOL  N/A 05/03/2023   Procedure: ESOPHAGOGASTRODUODENOSCOPY (EGD) WITH PROPOFOL ;  Surgeon: Leigh Elspeth SQUIBB, MD;  Location: [facility] ENDOSCOPY;  Service: Gastroenterology;  Laterality: N/A;   GASTROSTOMY N/A 08/11/2023   Procedure: INSERTION OF GASTROSTOMY TUBE;  Surgeon: Jordis Laneta FALCON, MD;  Location: [facility] ORS;  Service: General;  Laterality: N/A;   POSTERIOR CERVICAL LAMINECTOMY N/A 07/28/2023   Procedure: C2 DOME LAMINECTOMY;  Surgeon: [name] ORN, MD;  Location: [facility] ORS;  Service: Neurosurgery;  Laterality: N/A;   POSTERIOR CERVICAL LAMINECTOMY N/A 08/01/2023   Procedure: POSTERIOR CERVICAL LAMINECTOMY;  Surgeon: [name] ORN, MD;  Location: [facility] ORS;  Service: Neurosurgery;  Laterality: N/A;   TEAR DUCT PROBING Right    TUBAL LIGATION     VAGINAL HYSTERECTOMY N/A 03/16/2017   Procedure: HYSTERECTOMY VAGINAL;  Surgeon: Schermerhorn, Debby PARAS, MD;  Location: [facility] ORS;  Service: Gynecology;  Laterality: N/A;   WISDOM TOOTH EXTRACTION      Physical  Exam:  GEN- sedated but  moving upper extremities at times. EARS- external ears clear NOSE-  Clear anteriorly OC/OP-   ETT in place and secure NECK-  Previous thyroidectomy, palpable thyroid shield and landmarks RESP- ventilatory support  A/P: Failed extubation x2 with need for long term ventilatory support  Plan:  Patient with history of cervical laminectomy x2 and free bleeding.  Discussed with Neurosurgery and no concerns for neck extension for tracheostomy.  Discussed with Hematology and plan is to coordinate Cryoprecipitate for Monday morning.  Discussed with family and risks and benefits including bleeding, infection, continued poor progress discussed.  Consent obtained.  Plan is for tracheostomy Monday morning at 7:30.  Please hold anticoagulation and tube feeds at midnight.  Given bleeding history, would recommend platelets >100 and Hgb close to 10 if possible.  A total of 55 minutes was spent on discussions, examinations, and chart review on this patient consult.   Kaeden Depaz 08/13/2023 5:22 PM

## 2023-08-13 NOTE — Progress Notes (Signed)
 NAME:  [name], MRN:  [id number], DOB:  11/19/1955, LOS: 16 ADMISSION DATE:  07/28/2023, CHIEF COMPLAINT:  Cervical Myelopathy   History of Present Illness:   [name] is a 68 year old old female with PMHx of hypothyroidism, insulin -dependent DM, CKD stage III, dysphagia with esophageal strictures, facial cellulitis, GERD, neuropathy, arthritis, blood dyscrasia, and recent traumatic cervical central cord injury who presented to the hospital for elective cervical spine surgery.   Per patient's chart, patient was seen by neurosurgery on 07/28/2023 for follow-up after suffering a fall 3 months ago in which she sustained a cervical spinal cord injury.  Per patient, they deferred emergent surgical depression and wanted to let her have time to recover and work with physical therapy.  She continued to have hand weakness as well as lower extremity weakness and ambulatory issues.  Given the ongoing compression of C3-C6, laminoplasty and C2 dome laminectomy was indicated.     Hospital course: Patient underwent C3-6 laminoplasty and C2 dome and laminectomy surgery on 07/29/2023 with drain placement.   Pertinent Labs/Diagnostics Findings: Na+/ K+: 141/4.4 Glucose: 136 BUN/Cr.47/1.42 WBC: 5.8 K/L  Hgb/Hct: 7.4/22.4  Pertinent  Medical History  Hypothyroidism, insulin -dependent DM, CKD stage III, dysphagia with esophageal strictures, facial cellulitis, GERD, neuropathy, arthritis, blood dyscrasia, and recent traumatic cervical central cord injury  Significant Hospital Events: Including procedures, antibiotic start and stop dates in addition to other pertinent events   01/21: Patient underwent posterior C3-6 Cervical Laminoplasty with C2 inferior dome Laminectomy 01/22: POD #1. Pt. stable with no acute changes.  Working with PT and OT 01/23: POD #2.  Pt. noted to be very lethargic and difficult to arouse 01/24: POD #3. Pt. developed hypoxia tachycardia and shortness of breath.  Hospitalist  consulted CT chest obtained and showed possible pneumonia.  Started on IV antibiotics and DuoNebs. 01/25: POD #4. Pt. with new concerns of not moving the left arm.  CT head and cervical spine obtained with no new concerns.  Follow-up MRI however demonstrated epidural hematoma extending from the cervical spine to the lower thoracic spine with acute compression and T2 signal.  Taken emergently to the OR for surgical decompression 01/26: Pt. transferred to the ICU s/p emergent cervical decompression and evacuation of epidural hematoma.  Remained intubated. PCCM consulted 08/03/23- s/p surgery overnight, alert to self but drowsy with mild AKI and metabolic acidosis.  Lethargy noted have dcd cefepime  and started doxycycline  instead.  08/04/23- patient more alert able to wiggle toes to verbal.  Remains critically ill.  08/05/23- patient appears to have hypoactive delerium with waxing and waning sensorium.  At times she asks for things other times she's poorly responsive.  Her oxygenation has improved with nasal canula 5L/min.  S/p heme/onc evaluation today 08/06/23- patient seen at bedside, she was able to whisper few words tome.  I discussed PO intake with speech pathologist and SLP with barium was ordered however patient unable to complete this study. She is still very weak physically barely able to wiggle toes.  H/h <7 s/p prbc.  Renal function is improved.  08/07/23- patient had worsening hypoxemia with CXR showing worsening complete left opacification. I discussed this with neurosurgery and they stated her cervical spine did not have any new hardware so intubation should not be complicated. I met with PA Edsel Goods at bedside  to review care plan. We discussed these findings with husband and proceeded with intubation and bronchoscopy.  The entire left lung appeared completely full of mucus impaction in both upper and lower  lobes during bronchoscopy.  CXR post bronchoscopy is in process.  08/08/23- patient is  extubated and is able to speak few words with family.  She moved her feet and right hand today.  I met with husband and son at bedside reviewed imaging and bloodwork. 08/09/23- patient had severe event overnight with hypoxemia , CXR This am with complete opacification of left lung again.  She went into respiratory distress overnight with severe hypoxemia.  She was emergently intubated overnight. WE notified familly and discussed events and imaging findings. She is s/p bronch with improved CXR post procedure.  08/10/23 - remains weak and minimally responsive, flaccid upper and lower extremities 08/11/23 - MRI performed overnight. Remains minimally responsive but does open eyes and track. PEG tube placed by surgery 08/12/23 - acute drop in hemoglobin. Mental status unchanged 08/13/23 - non-responsive. Remains weak, but moving upper extremity non-purposefully Interim History / Subjective:  Opens eyes, tracks to verbal stimuli. Moving upper extremities without purpose  Objective   Blood pressure (!) 156/60, pulse (!) 54, temperature (!) 97.2 F (36.2 C), temperature source Axillary, resp. rate 16, height 5' 7 (1.702 m), weight 64.9 kg, SpO2 98%.    Vent Mode: PRVC FiO2 (%):  [30 %] 30 % Set Rate:  [16 bmp] 16 bmp Vt Set:  [450 mL] 450 mL PEEP:  [8 cmH20] 8 cmH20 Plateau Pressure:  [22 cmH20-25 cmH20] 25 cmH20   Intake/Output Summary (Last 24 hours) at 08/13/2023 0805 Last data filed at 08/13/2023 0715 Gross per 24 hour  Intake 4074.66 ml  Output 1520 ml  Net 2554.66 ml   Filed Weights   08/11/23 0320 08/12/23 0500 08/13/23 0500  Weight: 63.3 kg 64.1 kg 64.9 kg    Examination: Physical Exam Constitutional:      General: She is not in acute distress.    Appearance: She is ill-appearing.  Cardiovascular:     Rate and Rhythm: Normal rate and regular rhythm.     Pulses: Normal pulses.     Heart sounds: Normal heart sounds.  Pulmonary:     Comments: Ventilated breath sounds bilaterally Abdominal:      Palpations: Abdomen is soft.  Neurological:     Mental Status: She is disoriented.     Motor: Weakness present.     Comments: Tracks with eyes, moving upper extremities bilaterally, strength 1/5      Assessment & Plan:   #Acute Hypoxic Respiratory Failure #Aspiration Pneumonia #Toxic Metabolic Encephalopathy #Cervical Myelopathy s/p C2 dome laminectomy, C3-C6 laminoplasty; complicated by epidural hematoma s/p surgical decompression #Blood loss anemia #AKI on CKD stage III #Metabolic Acidosis #T2DM #Hypothyroidism #Acute Blood Loss Anemia  Neuro - C2 laminectomy, C3-C6 laminoplasty, complicated by epidural hematoma requiring surgical decompression. Course complicated by metabolic encephalopathy with concern for lower and upper extremity weakness. CT head and neck without acute bleed, but given persistent symptoms and history of epidural hematoma an MRI of the brain and spine was obtained with no restricted diffusion suggestive of damage rather than infarct. Able to pull volumes with SBT suggesting preservation of phrenic nerve, but given significant lower and upper extremity weakness I suspect she will be ventilator dependent with a tracheostomy tube for the immediate future. Discontinued sedation yesterday and she remains off.  CV - She is on phenylephrine  and vasopressin  for hypotension, suspect secondary to spinal shock from spinal cord injury. Was having atrial ectopy with nor-epinephrine . Pressor requirements stable. Goal MAP > 65  Pulm - intubated for respiratory failure secondary to encephalopthy complicated by  aspiration pneumonia and white out of the left lung. Now s/p flexible bronchoscopy for therapeutic aspiration of secretions, with improved aeration of the left lung. On mechanical ventilation, and performed well with SBT but mental status and significant upper and lower extremity weakness are barriers to extubation. Continue hypertonic saline and aggressive pulmonary  toilet. We have consulted ENT for consideration of tracheostomy tube placement.  Renal - AKI on CKD, with non-anion gap metabolic acidosis. Appreciate input from nephrology - starting sodium bicarbonate  infusion. Monitoring electrolytes and kidney function closely, avoiding nephrotoxins as able.  GI - PEG tube placed, and initiated on tube feeds. Continue PPI twice daily for now given recent GI bleed (likely ooze due to PEG placement).   Endo - on basal bolus regimen of insulin  with glargine, aspart and sliding scale.  Hem/Onc - enoxaparin  for DVT prophylaxis, will switch to heparin  5000 twice daily given AKI and concern for bleed. H/H drop yesterday concerning for GI bleed following PEG placement. Received PRBC, platelets, and FFPs in addition to DDAVP  with improvement. Hemoglobin stable today, will monitor twice daily for now.  ID - aspiration pneumonia secondary to encephalopathy and recent surgery with inability to protect the airway. Respiratory cultures previously grew Candida Glabrata, which is likely a colonizer but with rising white count and initiation of TPN, I am concerned for fungemia and will obtain blood cultures. Will also broaden IV antibiotics to cover for HAP with Ceftazidime  (avoiding Cefepime  due to encephalopathy) and Vancomycin .   Best Practice (right click and Reselect all SmartList Selections daily)   Diet/type: tubefeeds DVT prophylaxis prophylactic heparin   Pressure ulcer(s): N/A GI prophylaxis: PPI Lines: Central line and yes and it is still needed Foley:  Yes, and it is still needed Code Status:  full code Last date of multidisciplinary goals of care discussion [08/13/2023]  Labs   CBC: Recent Labs  Lab 08/10/23 0333 08/11/23 0351 08/12/23 0405 08/12/23 0759 08/12/23 1758 08/13/23 0440  WBC 24.4* 12.4* 15.5*  --  14.1* 11.6*  NEUTROABS  --   --   --   --  10.8*  --   HGB 8.9* 7.3* 5.5* 5.4* 8.2* 7.6*  HCT 27.0* 22.3* 16.1* 15.9* 23.6* 22.1*  MCV 97.1  92.1 89.0  --  87.4 87.0  PLT 188 116* 151  --  119* 107*    Basic Metabolic Panel: Recent Labs  Lab 08/09/23 0351 08/10/23 0333 08/11/23 0351 08/12/23 0405 08/13/23 0440  NA 146* 142 141 140 144  K 4.0 4.6 4.3 4.6 3.9  CL 125* 119* 115* 114* 111  CO2 12* 14* 14* 16* 22  GLUCOSE 128* 174* 271* 253* 166*  BUN 68* 80* 95* 105* 97*  CREATININE 0.75 1.56* 1.92* 2.05* 1.77*  CALCIUM  8.7* 8.2* 8.0* 8.4* 8.1*  MG 1.6* 2.7* 2.3 2.3 2.2  PHOS 2.5 4.0 3.8 3.5 4.0   GFR: Estimated Creatinine Clearance: 30.8 mL/min (A) (by C-G formula based on SCr of 1.77 mg/dL (H)). Recent Labs  Lab 08/09/23 0750 08/10/23 0333 08/11/23 0351 08/12/23 0405 08/12/23 1758 08/13/23 0440  WBC  --    < > 12.4* 15.5* 14.1* 11.6*  LATICACIDVEN 0.8  --   --   --   --   --    < > = values in this interval not displayed.    Liver Function Tests: Recent Labs  Lab 08/08/23 0405 08/09/23 0351 08/10/23 0333 08/12/23 0405 08/13/23 0440  AST  --   --  25  --   --  ALT  --   --  14  --   --   ALKPHOS  --   --  59  --   --   BILITOT  --   --  0.6  --   --   PROT  --   --  6.0*  --   --   ALBUMIN  3.4* 3.1* 3.2* 2.6* 2.9*   No results for input(s): LIPASE, AMYLASE in the last 168 hours. No results for input(s): AMMONIA in the last 168 hours.  ABG    Component Value Date/Time   PHART 7.23 (L) 08/09/2023 0637   PCO2ART 31 (L) 08/09/2023 0637   PO2ART 88 08/09/2023 0637   HCO3 13.0 (L) 08/09/2023 0637   ACIDBASEDEF 13.3 (H) 08/09/2023 0637   O2SAT 98.5 08/09/2023 0637     Coagulation Profile: No results for input(s): INR, PROTIME in the last 168 hours.   Cardiac Enzymes: No results for input(s): CKTOTAL, CKMB, CKMBINDEX, TROPONINI in the last 168 hours.  HbA1C: Hemoglobin A1C  Date/Time Value Ref Range Status  02/20/2013 04:02 AM 10.3 (H) 4.2 - 6.3 % Final    Comment:    The American Diabetes Association recommends that a primary goal of therapy should be <7% and that  physicians should reevaluate the treatment regimen in patients with HbA1c values consistently >8%.    Hgb A1c MFr Bld  Date/Time Value Ref Range Status  04/13/2023 11:34 AM 5.9 (H) 4.8 - 5.6 % Final    Comment:    (NOTE) Pre diabetes:          5.7%-6.4%  Diabetes:              >6.4%  Glycemic control for   <7.0% adults with diabetes   08/11/2021 08:39 PM 7.2 (H) 4.8 - 5.6 % Final    Comment:    (NOTE) Pre diabetes:          5.7%-6.4%  Diabetes:              >6.4%  Glycemic control for   <7.0% adults with diabetes     CBG: Recent Labs  Lab 08/12/23 1941 08/12/23 2322 08/12/23 2357 08/13/23 0316 08/13/23 0747  GLUCAP 72 64* 144* 144* 86    Review of Systems:   Unable to obtain  Past Medical History:  She,  has a past medical history of Acute incomplete quadriplegia (HCC), Acute respiratory failure with hypoxia (HCC), AKI (acute kidney injury) (HCC), Arthritis, Blood dyscrasia, Central cord syndrome (HCC), Dental caries, Diabetes mellitus without complication (HCC), Diverticulosis, Dysphagia, Esophageal stricture, Facial cellulitis, GERD (gastroesophageal reflux disease), Hypothyroidism, Neuropathy, Odynophagia, Pelvic fracture (HCC), Renal insufficiency, Spinal cord compression, post-traumatic (HCC), Spinal cord injury, cervical region (HCC), and Stage 3a chronic kidney disease (CKD) (HCC).   Surgical History:   Past Surgical History:  Procedure Laterality Date   ABDOMINAL HYSTERECTOMY     BILATERAL SALPINGECTOMY Bilateral 03/16/2017   Procedure: BILATERAL SALPINGECTOMY;  Surgeon: Schermerhorn, Debby PARAS, MD;  Location: [facility] ORS;  Service: Gynecology;  Laterality: Bilateral;   BIOPSY  05/03/2023   Procedure: BIOPSY;  Surgeon: Leigh Elspeth SQUIBB, MD;  Location: [facility] ENDOSCOPY;  Service: Gastroenterology;;   COLONOSCOPY WITH PROPOFOL  N/A 08/18/2018   Procedure: COLONOSCOPY WITH PROPOFOL ;  Surgeon: Viktoria Lamar DASEN, MD;  Location: [facility] ENDOSCOPY;  Service: Endoscopy;   Laterality: N/A;   CYSTOCELE REPAIR N/A 03/16/2017   Procedure: ANTERIOR REPAIR (CYSTOCELE);  Surgeon: Schermerhorn, Debby PARAS, MD;  Location: [facility] ORS;  Service: Gynecology;  Laterality: N/A;  ESOPHAGOGASTRODUODENOSCOPY (EGD) WITH PROPOFOL  N/A 08/18/2018   Procedure: ESOPHAGOGASTRODUODENOSCOPY (EGD) WITH PROPOFOL ;  Surgeon: Viktoria Lamar DASEN, MD;  Location[facility] ENDOSCOPY;  Service: Endoscopy;  Laterality: N/A;   ESOPHAGOGASTRODUODENOSCOPY (EGD) WITH PROPOFOL  Left 05/01/2023   Procedure: ESOPHAGOGASTRODUODENOSCOPY (EGD) WITH PROPOFOL ;  Surgeon: Leigh Elspeth SQUIBB, MD;  Location: [facility] ENDOSCOPY;  Service: Gastroenterology;  Laterality: Left;   ESOPHAGOGASTRODUODENOSCOPY (EGD) WITH PROPOFOL  N/A 05/03/2023   Procedure: ESOPHAGOGASTRODUODENOSCOPY (EGD) WITH PROPOFOL ;  Surgeon: Leigh Elspeth SQUIBB, MD;  Location: [facility] ENDOSCOPY;  Service: Gastroenterology;  Laterality: N/A;   GASTROSTOMY N/A 08/11/2023   Procedure: INSERTION OF GASTROSTOMY TUBE;  Surgeon: Jordis Laneta FALCON, MD;  Location: [facility] ORS;  Service: General;  Laterality: N/A;   POSTERIOR CERVICAL LAMINECTOMY N/A 07/28/2023   Procedure: C2 DOME LAMINECTOMY;  Surgeon: [name] ORN, MD;  Location: [facility] ORS;  Service: Neurosurgery;  Laterality: N/A;   POSTERIOR CERVICAL LAMINECTOMY N/A 08/01/2023   Procedure: POSTERIOR CERVICAL LAMINECTOMY;  Surgeon: [name] ORN, MD;  Location: [facility] ORS;  Service: Neurosurgery;  Laterality: N/A;   TEAR DUCT PROBING Right    TUBAL LIGATION     VAGINAL HYSTERECTOMY N/A 03/16/2017   Procedure: HYSTERECTOMY VAGINAL;  Surgeon: Schermerhorn, Debby PARAS, MD;  Location: [facility] ORS;  Service: Gynecology;  Laterality: N/A;   WISDOM TOOTH EXTRACTION       Social History:   reports that she has never smoked. She has never used smokeless tobacco. She reports that she does not drink alcohol and does not use drugs.   Family History:  Her family history includes Breast cancer in her maternal aunt; Diabetes in her father;  Hypertension in her father and mother.   Allergies Allergies  Allergen Reactions   Amoxicillin -Pot Clavulanate Itching and Anaphylaxis   Ampicillin Rash     Home Medications  Prior to Admission medications   Medication Sig Start Date End Date Taking? Authorizing Provider  acetaminophen  (TYLENOL ) 650 MG CR tablet Take 650 mg by mouth every 8 (eight) hours as needed for pain.   Yes [provider]  chlorhexidine  (HIBICLENS ) 4 % external liquid Apply 15 mLs (1 Application total) topically as directed for 30 doses. Use as directed daily for 5 days every other week for 6 weeks. 07/28/23  Yes Ulis Bottcher, PA-C  cyanocobalamin  (VITAMIN B12) 1000 MCG tablet Take 1,000 mcg by mouth daily.   Yes [provider]  cyclobenzaprine  (FLEXERIL ) 10 MG tablet Take 1 tablet (10 mg total) by mouth at bedtime. 06/15/23  Yes Lovorn, Megan, MD  diclofenac  Sodium (VOLTAREN ) 1 % GEL Apply 2 g topically 4 (four) times daily. Patient taking differently: Apply 2 g topically 4 (four) times daily as needed (pain). 05/11/23  Yes Setzer, Nena PARAS, PA-C  fludrocortisone  (FLORINEF ) 0.1 MG tablet Take 2 tablets (0.2 mg total) by mouth daily. For orthostatic hypotension 06/15/23  Yes Lovorn, Megan, MD  gabapentin  (NEURONTIN ) 100 MG capsule Take 200 mg by mouth 2 (two) times daily.  08/16/15  Yes [provider]  glipiZIDE  (GLUCOTROL ) 5 MG tablet Take 10 mg by mouth daily before breakfast.   Yes [provider]  JARDIANCE  25 MG TABS tablet Take 25 mg by mouth daily.   Yes [provider]  lactase (LACTAID) 3000 units tablet Take 3,000 Units by mouth daily before breakfast.   Yes [provider]  levothyroxine  (SYNTHROID ) 88 MCG tablet Take 88 mcg by mouth daily before breakfast.   Yes [provider]  midodrine  (PROAMATINE ) 10 MG tablet Take 1.5 tablets (15 mg total)  by mouth 3 (three) times daily with meals. For orthostatic hypotension 06/15/23  Yes Lovorn, Megan, MD   Multiple Vitamin (MULTIVITAMIN WITH MINERALS) TABS tablet Take 1 tablet by mouth daily. 04/22/23  Yes Alexander, Natalie, DO  mupirocin  ointment (BACTROBAN ) 2 % Place 1 Application into the nose 2 (two) times daily for 60 doses. Use as directed 2 times daily for 5 days every other week for 6 weeks. 07/28/23 08/27/23 Yes Ulis Bottcher, PA-C  pioglitazone  (ACTOS ) 30 MG tablet Take 30 mg by mouth daily.   Yes [provider]  Psyllium (METAMUCIL 3 IN 1 DAILY FIBER PO) Take 2 capsules by mouth daily.   Yes [provider]  rosuvastatin  (CRESTOR ) 5 MG tablet Take 5 mg by mouth at bedtime.   Yes [provider]  senna (SENOKOT) 8.6 MG TABS tablet Take 1 tablet (8.6 mg total) by mouth at bedtime. 05/11/23  Yes Setzer, Nena PARAS, PA-C     Critical care time: 40 minutes    Belva November, MD  Pulmonary Critical Care 08/13/2023 2:27 PM

## 2023-08-14 ENCOUNTER — Encounter: Payer: Medicare HMO | Admitting: Physician Assistant

## 2023-08-14 ENCOUNTER — Telehealth: Payer: Self-pay | Admitting: Neurosurgery

## 2023-08-14 DIAGNOSIS — J690 Pneumonitis due to inhalation of food and vomit: Secondary | ICD-10-CM | POA: Diagnosis not present

## 2023-08-14 DIAGNOSIS — G928 Other toxic encephalopathy: Secondary | ICD-10-CM | POA: Diagnosis not present

## 2023-08-14 DIAGNOSIS — D500 Iron deficiency anemia secondary to blood loss (chronic): Secondary | ICD-10-CM

## 2023-08-14 DIAGNOSIS — J9601 Acute respiratory failure with hypoxia: Secondary | ICD-10-CM | POA: Diagnosis not present

## 2023-08-14 DIAGNOSIS — G959 Disease of spinal cord, unspecified: Secondary | ICD-10-CM | POA: Diagnosis not present

## 2023-08-14 DIAGNOSIS — M6281 Muscle weakness (generalized): Secondary | ICD-10-CM

## 2023-08-14 DIAGNOSIS — Z7189 Other specified counseling: Secondary | ICD-10-CM | POA: Diagnosis not present

## 2023-08-14 LAB — BPAM PLATELET PHERESIS
Blood Product Expiration Date: 202502082359
Unit Type and Rh: 6200

## 2023-08-14 LAB — HEMOGLOBIN AND HEMATOCRIT, BLOOD
HCT: 28.7 % — ABNORMAL LOW (ref 36.0–46.0)
Hemoglobin: 9.8 g/dL — ABNORMAL LOW (ref 12.0–15.0)

## 2023-08-14 LAB — CBC
HCT: 28.8 % — ABNORMAL LOW (ref 36.0–46.0)
Hemoglobin: 10 g/dL — ABNORMAL LOW (ref 12.0–15.0)
MCH: 30.7 pg (ref 26.0–34.0)
MCHC: 34.7 g/dL (ref 30.0–36.0)
MCV: 88.3 fL (ref 80.0–100.0)
Platelets: 104 K/uL — ABNORMAL LOW (ref 150–400)
RBC: 3.26 MIL/uL — ABNORMAL LOW (ref 3.87–5.11)
RDW: 15.4 % (ref 11.5–15.5)
WBC: 10.9 K/uL — ABNORMAL HIGH (ref 4.0–10.5)
nRBC: 0.5 % — ABNORMAL HIGH (ref 0.0–0.2)

## 2023-08-14 LAB — FIBRINOGEN: Fibrinogen: 457 mg/dL (ref 210–475)

## 2023-08-14 LAB — RENAL FUNCTION PANEL
Albumin: 2.8 g/dL — ABNORMAL LOW (ref 3.5–5.0)
Anion gap: 11 (ref 5–15)
BUN: 90 mg/dL — ABNORMAL HIGH (ref 8–23)
CO2: 26 mmol/L (ref 22–32)
Calcium: 7.8 mg/dL — ABNORMAL LOW (ref 8.9–10.3)
Chloride: 111 mmol/L (ref 98–111)
Creatinine, Ser: 1.74 mg/dL — ABNORMAL HIGH (ref 0.44–1.00)
GFR, Estimated: 32 mL/min — ABNORMAL LOW (ref >60)
Glucose, Bld: 156 mg/dL — ABNORMAL HIGH (ref 70–99)
Phosphorus: 2.9 mg/dL (ref 2.5–4.6)
Potassium: 3.1 mmol/L — ABNORMAL LOW (ref 3.5–5.1)
Sodium: 148 mmol/L — ABNORMAL HIGH (ref 135–145)

## 2023-08-14 LAB — PROTIME-INR
INR: 1.4 — ABNORMAL HIGH (ref 0.8–1.2)
Prothrombin Time: 17 s — ABNORMAL HIGH (ref 11.4–15.2)

## 2023-08-14 LAB — PREPARE PLATELET PHERESIS: Unit division: 0

## 2023-08-14 LAB — GLUCOSE, CAPILLARY
Glucose-Capillary: 174 mg/dL — ABNORMAL HIGH (ref 70–99)
Glucose-Capillary: 147 mg/dL — ABNORMAL HIGH (ref 70–99)
Glucose-Capillary: 222 mg/dL — ABNORMAL HIGH (ref 70–99)
Glucose-Capillary: 232 mg/dL — ABNORMAL HIGH (ref 70–99)
Glucose-Capillary: 237 mg/dL — ABNORMAL HIGH (ref 70–99)
Glucose-Capillary: 307 mg/dL — ABNORMAL HIGH (ref 70–99)

## 2023-08-14 LAB — MAGNESIUM: Magnesium: 2.3 mg/dL (ref 1.7–2.4)

## 2023-08-14 LAB — APTT: aPTT: 32 s (ref 24–36)

## 2023-08-14 MED ORDER — POTASSIUM CHLORIDE 20 MEQ PO PACK
40.0000 meq | PACK | ORAL | Status: AC
  Administered 2023-08-14 (×3): 40 meq
  Filled 2023-08-14 (×3): qty 2

## 2023-08-14 MED ORDER — FREE WATER: 200.0000 mL | Freq: Four times a day (QID) | Status: DC

## 2023-08-14 MED ORDER — FREE WATER
100.0000 mL | Freq: Four times a day (QID) | Status: DC
  Administered 2023-08-14 – 2023-08-15 (×4): 100 mL

## 2023-08-14 MED ORDER — SODIUM CHLORIDE 0.9% IV SOLUTION: Freq: Once | INTRAVENOUS | Status: DC

## 2023-08-14 NOTE — Progress Notes (Signed)
 Neurosurgery Progress Note  History: [name] is here for C2 dome laminectomy, C3-6 laminoplasty and now s/p evacuation hematoma  POD16/13: Pt reportedly moving upper extremities per CC this morning  POD15/12: Pt more interactive per family at bedside and CC team POD14/11: PEG placed yesterday. Pt with worsening anemia. Neurologically unchanged  POD13/10: MRI this morning does not appear to be consistent with spinal cord infarct.  Patient's neurologic exam is unchanged. POD12/9: Pt had neuro change early this morning prompting CT head and C spine.  POD 11/7: Was extubated this morning prior to evaluation.  Was able to track with eyes but was quite somnolent still.  Was reacting to painful stimuli in the bilateral lower extremities.  Left upper extremity was reacting to painful stimuli as well. POD 10/6: Pt experiencing increased work of breathing this morning and worsening findings on chest xray resulting in reintubation this morning. She is currently undergoing bronchoscopy by Dr. Aleskerov  POD 9/5: Pt more alert this morning asking what day it is  POD 8/4: Very drowsy but arouses this morning to voice.  POD 7/3: Pt is very drowsy, but able to follow some commands  POD6/2: Pt is drowsy this morning asking what do you want when awaken  Interval POD5: Ms. Tibbs had a decline yesterday with her left upper extremity and then bilateral lower extremities.  CT scan was unremarkable MRI did show a large epidural hematoma.  She was taken emergently to the OR by Dr. Claudene for decompression.  She has been monitored in the ICU overnight. She is still intubated and on sedation. She is on pressors to maintain [facility]   Hospital Course POD4: Ms Severtson is more awake today. She states she isnt having a lot of pain but she is thirsty.  POD3:Patient more alert this AM. However; hypoxia, tachycardia, SOB, low energy.  POD2: Very lethargic and difficult to arouse, states in quite a bit of pain. Medicine  had been held due to sleepiness.    Physical Exam: Vitals:   08/14/23 0815 08/14/23 0830  BP:    Pulse: 71 77  Resp: (!) 22 18  Temp: 98.1 F (36.7 C) 97.9 F (36.6 C)  SpO2: 93% 94%    Intubated. Grimaces to pain  No spontaneous movement or movement to commands in extremities.   Data:  Output by Drain (mL) 08/12/23 0701 - 08/12/23 1900 08/12/23 1901 - 08/13/23 0700 08/13/23 0701 - 08/13/23 1900 08/13/23 1901 - 08/14/23 0700 08/14/23 0701 - 08/14/23 0849  Requested LDAs do not have output data documented.     Assessment/Plan:  [name]  s/p C2 dome laminectomy, C3-6 laminoplasty  On 1/21 with return to OR on 1/25 for cervical/thoracic laminectomy and epidural hematoma evacuation.  She has had a complicated postoperative course with a spontaneous cervical thoracic hemorrhage on 125 which was taken emergently for decompression.  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  - MRI of neuro axis 08/10/23 showing diffuse central spinal cord swelling concerning for spinal cord infarct.  DWI series repeated 2/4  which did not show restricted diffusion.  - pain control continue with IV and oral medications - HV removed on 1/29 - pneumonia; recommendations per CC  - EEG showing moderate to severe encephalopathy  - plans for trach 2/10 - CC and IM assisting with non surgical management. We appreciate your assistance.   Edsel Goods PA-C Department of  Neurosurgery

## 2023-08-14 NOTE — Progress Notes (Signed)
 Daily Progress Note   Patient Name: [name]       Date: 08/14/2023 DOB: April 07, 1956  Age: 68 y.o. MRN#: [id number] Attending Physician: [name] ORN, MD Primary Care Physician: Valora Agent, MD Admit Date: 07/28/2023  Reason for Consultation/Follow-up: Establishing goals of care  Subjective: Notes and labs reviewed.  Into see patient.  She appears restless.  She  nods her head that her name is [name].  She shakes her head to all other questions including those that should have yes responses.  Patient's son and her brother are at bedside.  They discussed plans for tracheostomy this coming week.  As goals are set PMT will sign off.  Please reconsult if needs arise.  Length of Stay: 17  Current Medications: Scheduled Meds:  . sodium chloride    Intravenous Once  . sodium chloride    Intravenous Once  . Chlorhexidine  Gluconate Cloth  6 each Topical Daily  . docusate  100 mg Per Tube BID  . feeding supplement (PROSource TF20)  60 mL Per Tube Daily  . free water   100 mL Per Tube Q6H  . insulin  aspart  0-15 Units Subcutaneous Q4H  . insulin  glargine-yfgn  5 Units Subcutaneous Daily  . levothyroxine   88 mcg Per Tube QAC breakfast  . mouth rinse  15 mL Mouth Rinse Q2H  . pantoprazole  (PROTONIX ) IV  40 mg Intravenous Q12H  . polyethylene glycol  17 g Per Tube Daily  . potassium chloride   40 mEq Per Tube Q4H  . sodium chloride  flush  10-40 mL Intracatheter Q12H  . sodium chloride  flush  3-10 mL Intravenous Q12H    Continuous Infusions: . albumin  human 12.5 g (08/14/23 0828)  . cefTAZidime  (FORTAZ )  IV 2 g (08/14/23 0330)  . feeding supplement (OSMOLITE 1.5 CAL) 60 mL/hr at 08/14/23 1500  . phenylephrine  (NEO-SYNEPHRINE) Adult infusion 8 mcg/min (08/14/23 1500)  . vasopressin   0.03 Units/min (08/14/23 1500)    PRN Meds: bisacodyl , fentaNYL  (SUBLIMAZE ) injection, fentaNYL  (SUBLIMAZE ) injection, menthol -cetylpyridinium **OR** [DISCONTINUED] phenol, mouth rinse, sodium chloride  flush, sodium chloride  flush  Physical Exam Constitutional:      Comments: Eyes closed but restless  Pulmonary:     Comments: On ventilator            Vital Signs: BP (!) 152/74   Pulse 70   Temp (!)  97.5 F (36.4 C) (Esophageal)   Resp 19   Ht 5' 7 (1.702 m)   Wt 70.4 kg   SpO2 95%   BMI 24.31 kg/m  SpO2: SpO2: 95 % O2 Device: O2 Device: Ventilator O2 Flow Rate: O2 Flow Rate (L/min): 45 L/min  Intake/output summary:  Intake/Output Summary (Last 24 hours) at 08/14/2023 1604 Last data filed at 08/14/2023 1500 Gross per 24 hour  Intake 3932.61 ml  Output 1610 ml  Net 2322.61 ml   LBM: Last BM Date : 08/14/23 Baseline Weight: Weight: 58.9 kg Most recent weight: Weight: 70.4 kg    Patient Active Problem List   Diagnosis Date Noted  . Toxic metabolic encephalopathy 08/12/2023  . Myelopathy of thoracic region 08/04/2023  . Hematoma 08/04/2023  . Spinal stenosis, thoracic 08/04/2023  . Malnutrition of moderate degree 08/03/2023  . Lobar pneumonia (HCC) 08/01/2023  . MRSA (methicillin resistant staph aureus) culture positive 08/01/2023  . Cervical myelopathy (HCC) 07/28/2023  . Spinal stenosis in cervical region 06/18/2023  . Spinal cord injury at C5-C7 level without injury of spinal bone (HCC) 06/18/2023  . Arm weakness 06/18/2023  . Numbness and tingling 06/18/2023  . Spasticity 06/15/2023  . Orthostatic hypotension 06/15/2023  . Esophageal stricture 05/01/2023  . Dysphagia 04/30/2023  . Odynophagia 04/30/2023  . Gastroesophageal reflux disease 04/30/2023  . Acute incomplete quadriplegia (HCC) 04/23/2023  . Spinal cord injury, cervical region (HCC) 04/22/2023  . Spinal cord compression, post-traumatic (HCC) 04/13/2023  . Central cord syndrome (HCC) 04/13/2023  .  Muscle weakness of right upper extremity 04/13/2023  . Weakness of right lower extremity 04/13/2023  . Pelvic fracture (HCC) 04/13/2023  . Cellulitis 08/12/2021  . Facial cellulitis 08/11/2021  . Dental caries 08/11/2021  . COVID-19 virus infection 08/11/2021  . Diabetes mellitus without complication (HCC)   . Hypothyroidism   . Stage 3a chronic kidney disease (HCC)   . Non-dose-related adverse reaction to medication   . Post-operative state 03/16/2017  . AKI (acute kidney injury) (HCC) 01/18/2017  . Acute hypoxic respiratory failure (HCC) 11/04/2015    Palliative Care Assessment & Plan   Recommendations/Plan: Plans for tracheostomy this coming week. As goals are set PMT will sign off at this time.  Please reconsult if needs arise.  Code Status:    Code Status Orders  (From admission, onward)           Start     Ordered   07/28/23 1459  Full code  Continuous       Question:  By:  Answer:  Procedural case: previous code status reviewed   07/28/23 1458           Code Status History     Date Active Date Inactive Code Status Order ID Comments User Context   04/22/2023 1239 05/12/2023 1531 Full Code [id number]  Rosendo Nena PARAS, PA-C Inpatient   04/22/2023 1239 04/22/2023 1239 Full Code [id number]  Rosendo Nena PARAS, PA-C Inpatient   04/13/2023 1640 04/22/2023 1237 Full Code [id number]  Marca Maude POUR, MD ED   04/13/2023 1629 04/13/2023 1640 Full Code [id number]  Isaiah Scrivener, MD ED   08/11/2021 2222 08/16/2021 1950 Full Code [id number]  Cleatus Delayne GAILS, MD ED   03/16/2017 1059 03/17/2017 1935 Full Code [id number]  Schermerhorn, Debby PARAS, MD Inpatient   01/18/2017 1745 01/19/2017 2033 Full Code [id number]  Tobie Calix, MD ED   11/04/2015 1219 11/06/2015 2047 Full Code [id number]  Almeda Bernard, MD ED        Thank  you for allowing the Palliative Medicine Team to assist in the care of this patient.   Camelia Lewis, NP  Please contact Palliative Medicine Team phone at  [phone] for questions and concerns.

## 2023-08-14 NOTE — Plan of Care (Signed)
 Continuing with plan of care.

## 2023-08-14 NOTE — Progress Notes (Signed)
 [name]   DOB:1955/07/15   FM#:[id number]    Subjective: Pt currently intubated; status post PEG tube placement.  Currently awaiting tracheostomy on February 10. Objective:  Vitals:   08/14/23 2000 08/14/23 2100  BP:    Pulse: 64 63  Resp: 17 16  Temp: (!) 97.5 F (36.4 C) (!) 97.3 F (36.3 C)  SpO2: 96% 96%     Intake/Output Summary (Last 24 hours) at 08/14/2023 2248 Last data filed at 08/14/2023 2108 Gross per 24 hour  Intake 3882.33 ml  Output 1880 ml  Net 2002.33 ml    Physical Exam Vitals and nursing note reviewed.  Constitutional:      Comments: Intubated/sedated  HENT:     Head: Normocephalic and atraumatic.  Cardiovascular:     Rate and Rhythm: Regular rhythm. Tachycardia present.  Pulmonary:     Comments: Decreased breath sounds bilaterally.  Coarse breath sounds Abdominal:     General: Abdomen is flat.     Palpations: Abdomen is soft.  Skin:    General: Skin is warm and dry.  Neurological:     Comments: Sedated     Labs:  Lab Results  Component Value Date   WBC 10.9 (H) 08/14/2023   HGB 9.8 (L) 08/14/2023   HCT 28.7 (L) 08/14/2023   MCV 88.3 08/14/2023   PLT 104 (L) 08/14/2023   NEUTROABS 6.8 08/13/2023    Lab Results  Component Value Date   NA 148 (H) 08/14/2023   K 3.1 (L) 08/14/2023   CL 111 08/14/2023   CO2 26 08/14/2023    Studies:  US  Venous Img Upper Uni Right(DVT) Result Date: 08/13/2023 CLINICAL DATA:  [id number] Swelling of right upper extremity [id number] EXAM: RIGHT UPPER EXTREMITY VENOUS DOPPLER ULTRASOUND TECHNIQUE: Gray-scale sonography with graded compression, as well as color Doppler and duplex ultrasound were performed to evaluate the upper extremity deep venous system from the level of the subclavian vein and including the jugular, axillary, basilic, radial, ulnar and upper cephalic vein. Spectral Doppler was utilized to evaluate flow at rest and with distal augmentation maneuvers. COMPARISON:  Chest XR, 08/09/2023. FINDINGS:  VENOUS Normal compressibility of the RIGHT internal jugular, subclavian, axillary, cephalic, basilic, brachial, radial and ulnar veins. No filling defects to suggest DVT on grayscale or color Doppler imaging. Doppler waveforms show normal direction of venous flow, normal respiratory plasticity and response to augmentation. A catheter is present within the basilic vein. Limited views of the contralateral subclavian vein are unremarkable. OTHER No evidence of superficial thrombophlebitis or abnormal fluid collection. Limitations: none IMPRESSION: 1. No evidence of DVT or superficial thrombophlebitis within the RIGHT upper extremity. 2. PICC present within the basilic vein. Thom Hall, MD [facility] Radiology Electronically Signed   By: Thom Hall M.D.   On: 08/13/2023 11:14    No problem-specific Assessment & Plan notes found for this encounter.  # 68 year old female patient with history of chronic kidney disease, chronic anemia currently status post surgery C2   laminectomy, C3-6 laminoplasty-postoperative bleeding now s/p evacuation hematoma- on 1/25.  # Suspected bleeding diatheses without any clear diagnosis-intrinsic/extrinsic coagulation pathway-fairly unremarkable.  However workup in the context of hospitalization/ ICU [pro inflammatory]-is inconclusive.  The fact the patient has a history of easy bleeding, and bleeding postoperatively after neck surgery, and also suspected post PEG tube placement-given upcoming tracheostomy I would recommend prophylactic 2 units of cryoprecipitate to be transfused 2 hours prior to the procedure.  Orders placed in the computer.  And also  discussed with the nurse.   # Postoperative bleeding/cervical epidural hematoma-causing cord compression s/p evacuation on 1/25.     # Acute on chronic anemia-multifactorial suspect bleeding-clinically stable transfuse to keep hemoglobin above 7.  # Acute respiratory failure  status intubation currently awaiting tracheostomy.   Cindy JONELLE Joe, MD 08/14/2023  10:48 PM

## 2023-08-14 NOTE — Progress Notes (Signed)
 [facility]  ROUNDING NOTE   Subjective:   Patient remains intubated. Good urine output noted. Creatinine currently 1.7. Tracheostomy being planned for Monday. 02/06 0701 - 02/07 0700 In: 4397.1 [I.V.:2158.2; Blood:770; NG/GT:1268.8; IV Piggyback:200.1] Out: 1450 [Urine:1450] Lab Results  Component Value Date   CREATININE 1.74 (H) 08/14/2023   CREATININE 1.77 (H) 08/13/2023   CREATININE 2.05 (H) 08/12/2023      Objective:  Vital signs in last 24 hours:  Temp:  [97 F (36.1 C)-99.9 F (37.7 C)] 97.5 F (36.4 C) (02/07 1900) Pulse Rate:  [52-78] 61 (02/07 1900) Resp:  [8-26] 16 (02/07 1900) BP: (93-172)/(42-81) 152/74 (02/07 1230) SpO2:  [86 %-98 %] 97 % (02/07 1900) Arterial Line BP: (86-171)/(34-64) 126/50 (02/07 1900) FiO2 (%):  [30 %] 30 % (02/07 1900) Weight:  [70.4 kg] 70.4 kg (02/07 0427)  Weight change: 5.5 kg Filed Weights   08/12/23 0500 08/13/23 0500 08/14/23 0427  Weight: 64.1 kg 64.9 kg 70.4 kg    Intake/Output: I/O last 3 completed shifts: In: 5596.2 [I.V.:2537.2; Blood:770; WH/HU:8011.1; IV Piggyback:300.1] Out: 2545 [Urine:2310; Stool:235]   Intake/Output this shift:  No intake/output data recorded.  Physical Exam: General: Critically ill-appearing  Head: Normocephalic, atraumatic.  Endotracheal tube in place  Neck: Supple  Lungs:  Scattered rhonchi, vent assisted  Heart: S1S2 no rubs  Abdomen:  Soft, nontender, bowel sounds present  Extremities: Trace peripheral edema.  Neurologic: Intubated on the ventilator  Skin: No acute rash  Access: No hemodialysis access    Basic Metabolic Panel: Recent Labs  Lab 08/10/23 0333 08/11/23 0351 08/12/23 0405 08/13/23 0440 08/14/23 0411  NA 142 141 140 144 148*  K 4.6 4.3 4.6 3.9 3.1*  CL 119* 115* 114* 111 111  CO2 14* 14* 16* 22 26  GLUCOSE 174* 271* 253* 166* 156*  BUN 80* 95* 105* 97* 90*  CREATININE 1.56* 1.92* 2.05* 1.77* 1.74*  CALCIUM  8.2* 8.0* 8.4* 8.1* 7.8*  MG 2.7*  2.3 2.3 2.2 2.3  PHOS 4.0 3.8 3.5 4.0 2.9    Liver Function Tests: Recent Labs  Lab 08/09/23 0351 08/10/23 0333 08/12/23 0405 08/13/23 0440 08/14/23 0411  AST  --  25  --   --   --   ALT  --  14  --   --   --   ALKPHOS  --  59  --   --   --   BILITOT  --  0.6  --   --   --   PROT  --  6.0*  --   --   --   ALBUMIN  3.1* 3.2* 2.6* 2.9* 2.8*   No results for input(s): LIPASE, AMYLASE in the last 168 hours. No results for input(s): AMMONIA in the last 168 hours.  CBC: Recent Labs  Lab 08/12/23 0405 08/12/23 0759 08/12/23 1758 08/13/23 0440 08/13/23 1618 08/14/23 0411 08/14/23 1336  WBC 15.5*  --  14.1* 11.6* 8.7 10.9*  --   NEUTROABS  --   --  10.8*  --  6.8  --   --   HGB 5.5*   < > 8.2* 7.6* 6.5* 10.0* 9.8*  HCT 16.1*   < > 23.6* 22.1* 18.9* 28.8* 28.7*  MCV 89.0  --  87.4 87.0 88.7 88.3  --   PLT 151  --  119* 107* 98* 104*  --    < > = values in this interval not displayed.    Cardiac Enzymes: No results for input(s): CKTOTAL, CKMB, CKMBINDEX, TROPONINI in  the last 168 hours.  BNP: Invalid input(s): POCBNP  CBG: Recent Labs  Lab 08/13/23 2351 08/14/23 0338 08/14/23 0728 08/14/23 1143 08/14/23 1536  GLUCAP 261* 174* 147* 222* 232*    Microbiology: Results for orders placed or performed during the hospital encounter of 07/28/23  MRSA Next Gen by PCR, Nasal     Status: Abnormal   Collection Time: 08/01/23  5:00 AM   Specimen: Nasal Mucosa; Nasal Swab  Result Value Ref Range Status   MRSA by PCR Next Gen DETECTED (A) NOT DETECTED Final    Comment: RESULT CALLED TO, READ BACK BY AND VERIFIED WITH: JOAN WILLIS @0923  08/01/23 MJU (NOTE) The GeneXpert MRSA Assay (FDA approved for NASAL specimens only), is one component of a comprehensive MRSA colonization surveillance program. It is not intended to diagnose MRSA infection nor to guide or monitor treatment for MRSA infections. Test performance is not FDA approved in patients less than 25  years old. Performed at [facility], [address] [place], [place] [address]   Culture, BAL-quantitative w Gram Stain     Status: Abnormal   Collection Time: 08/07/23 11:57 AM   Specimen: Bronchoalveolar Lavage; Respiratory  Result Value Ref Range Status   Specimen Description   Final    BRONCHIAL ALVEOLAR LAVAGE Performed at [facility], [address], [place], [place] [address]    Special Requests   Final    NONE Performed at [facility], [address] [place], [place] [address]    Gram Stain   Final    MODERATE WBC PRESENT,BOTH PMN AND MONONUCLEAR FEW YEAST Performed at [facility], [address], [place], [place] [address]    Culture 30,000 COLONIES/mL CANDIDA GLABRATA (A)  Final   Report Status 08/10/2023 FINAL  Final  Culture, BAL-quantitative w Gram Stain     Status: Abnormal   Collection Time: 08/09/23 10:36 AM   Specimen: Bronchoalveolar Lavage; Respiratory  Result Value Ref Range Status   Specimen Description   Final    BRONCHIAL ALVEOLAR LAVAGE Performed at [facility], [address] [place], [place] [address]    Special Requests   Final    NONE Performed at [facility], [address] [place], [place] [address]    Gram Stain   Final    MODERATE WBC PRESENT,BOTH PMN AND MONONUCLEAR RARE GRAM POSITIVE COCCI IN PAIRS FEW YEAST Performed at [facility], [address], [place], [place] [address]    Culture 80,000 COLONIES/mL CANDIDA GLABRATA (A)  Final   Report Status 08/12/2023 FINAL  Final  Culture, blood (Routine X 2) w Reflex to ID Panel     Status: None (Preliminary result)   Collection Time: 08/10/23 11:45 AM   Specimen: BLOOD RIGHT ARM  Result Value Ref Range Status   Specimen Description BLOOD RIGHT ARM  Final   Special Requests   Final    BOTTLES DRAWN AEROBIC AND ANAEROBIC Blood Culture adequate volume   Culture   Final    NO GROWTH 4 DAYS Performed at [facility], [address], [place], [place] [address]    Report Status PENDING  Incomplete  Culture, blood (Routine X 2) w Reflex to ID Panel     Status: None (Preliminary result)   Collection Time: 08/10/23 11:46 AM   Specimen: BLOOD LEFT HAND  Result Value Ref Range Status   Specimen Description BLOOD LEFT HAND  Final   Special Requests   Final  BOTTLES DRAWN AEROBIC AND ANAEROBIC Blood Culture adequate volume   Culture   Final    NO GROWTH 4 DAYS Performed at [facility], [address] [place], [place] [address]    Report Status PENDING  Incomplete    Coagulation Studies: Recent Labs    08/14/23 1336  LABPROT 17.0*  INR 1.4*    Urinalysis: No results for input(s): COLORURINE, LABSPEC, PHURINE, GLUCOSEU, HGBUR, BILIRUBINUR, KETONESUR, PROTEINUR, UROBILINOGEN, NITRITE, LEUKOCYTESUR in the last 72 hours.  Invalid input(s): APPERANCEUR    Imaging: US  Venous Img Upper Uni Right(DVT) Result Date: 08/13/2023 CLINICAL DATA:  [id number] Swelling of right upper extremity [id number] EXAM: RIGHT UPPER EXTREMITY VENOUS DOPPLER ULTRASOUND TECHNIQUE: Gray-scale sonography with graded compression, as well as color Doppler and duplex ultrasound were performed to evaluate the upper extremity deep venous system from the level of the subclavian vein and including the jugular, axillary, basilic, radial, ulnar and upper cephalic vein. Spectral Doppler was utilized to evaluate flow at rest and with distal augmentation maneuvers. COMPARISON:  Chest XR, 08/09/2023. FINDINGS: VENOUS Normal compressibility of the RIGHT internal jugular, subclavian, axillary, cephalic, basilic, brachial, radial and ulnar veins. No filling defects to suggest DVT on grayscale or color Doppler imaging. Doppler waveforms show normal direction of venous flow, normal respiratory plasticity and response to augmentation. A catheter is present within the basilic vein. Limited views of the  contralateral subclavian vein are unremarkable. OTHER No evidence of superficial thrombophlebitis or abnormal fluid collection. Limitations: none IMPRESSION: 1. No evidence of DVT or superficial thrombophlebitis within the RIGHT upper extremity. 2. PICC present within the basilic vein. Thom Hall, MD [facility] Radiology Electronically Signed   By: Thom Hall M.D.   On: 08/13/2023 11:14     Medications:    albumin  human 12.5 g (08/14/23 0828)   cefTAZidime  (FORTAZ )  IV Stopped (08/14/23 1709)   feeding supplement (OSMOLITE 1.5 CAL) 60 mL/hr at 08/14/23 1900   phenylephrine  (NEO-SYNEPHRINE) Adult infusion 10 mcg/min (08/14/23 1900)   vasopressin  0.03 Units/min (08/14/23 1900)    sodium chloride    Intravenous Once   sodium chloride    Intravenous Once   Chlorhexidine  Gluconate Cloth  6 each Topical Daily   docusate  100 mg Per Tube BID   feeding supplement (PROSource TF20)  60 mL Per Tube Daily   free water   100 mL Per Tube Q6H   insulin  aspart  0-15 Units Subcutaneous Q4H   insulin  glargine-yfgn  5 Units Subcutaneous Daily   levothyroxine   88 mcg Per Tube QAC breakfast   mouth rinse  15 mL Mouth Rinse Q2H   pantoprazole  (PROTONIX ) IV  40 mg Intravenous Q12H   polyethylene glycol  17 g Per Tube Daily   sodium chloride  flush  10-40 mL Intracatheter Q12H   sodium chloride  flush  3-10 mL Intravenous Q12H   bisacodyl , fentaNYL  (SUBLIMAZE ) injection, fentaNYL  (SUBLIMAZE ) injection, menthol -cetylpyridinium **OR** [DISCONTINUED] phenol, mouth rinse, sodium chloride  flush, sodium chloride  flush  Assessment/ Plan:  67 y.o. female with a PMHx of acute incomplete quadriplegia, history of easy bleeding, central cord syndrome, diabetes mellitus type 2, diverticulosis, GERD, hypothyroidism, peripheral neuropathy, who was admitted to [facility] 07[facility]/2023 for C2 dome laminectomy, C3-6 laminoplasty with postoperative bleeding and epidural hematoma causing  spinal cord compression s/p evacuation 08/01/2023 who we are asked to see now for acute kidney injury.   1.  Acute kidney injury/chronic kidney disease stage II baseline creatinine 0.75.  Suspect acute kidney injury now related to relative  hypotension. 02/06 0701 - 02/07 0700 In: 4397.1 [I.V.:2158.2; Blood:770; NG/GT:1268.8; IV Piggyback:200.1] Out: 1450 [Urine:1450] Lab Results  Component Value Date   CREATININE 1.74 (H) 08/14/2023   CREATININE 1.77 (H) 08/13/2023   CREATININE 2.05 (H) 08/12/2023  Patient continues to have good urine output.  Creatinine has stabilized at 1.7.  No immediate need for dialysis.  Continue to monitor renal parameters.   2.  Anemia of chronic kidney disease.  Hemoglobin was up to 10 status post transfusion.  Continue to monitor CBC closely.  3.  Acute respiratory failure.  P patient maintained on vent support and ENT planning for tracheostomy on Monday.   LOS: 17 Damen [place] 2/7/20257:20 PM

## 2023-08-14 NOTE — TOC Progression Note (Signed)
 Transition of Care [facility]) - Progression Note    Patient Details  Name: [name] MRN: [id number] Date of Birth: 1956/01/24  Transition of Care [facility] CM/SW Contact  Lauraine JAYSON Carpen, LCSW Phone Number: 08/14/2023, 3:26 PM  Clinical Narrative:   TOC continues to follow progress.  Expected Discharge Plan: Skilled Nursing Facility Barriers to Discharge: SNF Pending bed offer, Insurance Authorization  Expected Discharge Plan and Services   Discharge Planning Services: CM Consult   Living arrangements for the past 2 months: Single Family Home                   DME Agency: NA       HH Arranged: NA           Social Determinants of Health (SDOH) Interventions SDOH Screenings   Food Insecurity: No Food Insecurity (07/28/2023)  Housing: Low Risk  (07/28/2023)  Transportation Needs: No Transportation Needs (07/28/2023)  Utilities: Not At Risk (07/28/2023)  Depression (PHQ2-9): Low Risk  (06/15/2023)  Social Connections: Moderately Integrated (07/28/2023)  Tobacco Use: Low Risk  (07/28/2023)    Readmission Risk Interventions     No data to display

## 2023-08-14 NOTE — Telephone Encounter (Signed)
 Patient's spouse reached out today over the phone to discuss some of her current events.  He was pleased that she was more interactive yesterday with increased movement in her bilateral upper extremities.  He was calling to inquire about her recent blood transfusion.  I let him know that I would reach out to the intensive care team as well as the other consultants to see if there are any concerns about active ongoing bleeding.  He was asking again about her encephalopathy or responsiveness, I let him know that she will likely fluctuate throughout the day and throughout the rest of her stay given her level of critical illness.  She is at risk for kidney injury, electrolyte disturbances among multiple other reasons for having encephalopathy.  Will continue to watch her closely.

## 2023-08-14 NOTE — Progress Notes (Signed)
 NAME:  [name], MRN:  [id number], DOB:  [birth year], LOS: 17 ADMISSION DATE:  07/28/2023, CHIEF COMPLAINT:  Cervical Myelopathy   History of Present Illness:   [name] is a 68 year old old female with PMHx of hypothyroidism, insulin -dependent DM, CKD stage III, dysphagia with esophageal strictures, facial cellulitis, GERD, neuropathy, arthritis, blood dyscrasia, and recent traumatic cervical central cord injury who presented to the hospital for elective cervical spine surgery.   Per patient's chart, patient was seen by neurosurgery on 07/28/2023 for follow-up after suffering a fall 3 months ago in which she sustained a cervical spinal cord injury.  Per patient, they deferred emergent surgical depression and wanted to let her have time to recover and work with physical therapy.  She continued to have hand weakness as well as lower extremity weakness and ambulatory issues.  Given the ongoing compression of C3-C6, laminoplasty and C2 dome laminectomy was indicated.     Hospital course: Patient underwent C3-6 laminoplasty and C2 dome and laminectomy surgery on 07/29/2023 with drain placement.   Pertinent Labs/Diagnostics Findings: Na+/ K+: 141/4.4 Glucose: 136 BUN/Cr.47/1.42 WBC: 5.8 K/L  Hgb/Hct: 7.4/22.4  Pertinent  Medical History  Hypothyroidism, insulin -dependent DM, CKD stage III, dysphagia with esophageal strictures, facial cellulitis, GERD, neuropathy, arthritis, blood dyscrasia, and recent traumatic cervical central cord injury  Significant Hospital Events: Including procedures, antibiotic start and stop dates in addition to other pertinent events   01/21: Patient underwent posterior C3-6 Cervical Laminoplasty with C2 inferior dome Laminectomy 01/22: POD #1. Pt. stable with no acute changes.  Working with PT and OT 01/23: POD #2.  Pt. noted to be very lethargic and difficult to arouse 01/24: POD #3. Pt. developed hypoxia tachycardia and shortness of breath.  Hospitalist  consulted CT chest obtained and showed possible pneumonia.  Started on IV antibiotics and DuoNebs. 01/25: POD #4. Pt. with new concerns of not moving the left arm.  CT head and cervical spine obtained with no new concerns.  Follow-up MRI however demonstrated epidural hematoma extending from the cervical spine to the lower thoracic spine with acute compression and T2 signal.  Taken emergently to the OR for surgical decompression 01/26: Pt. transferred to the ICU s/p emergent cervical decompression and evacuation of epidural hematoma.  Remained intubated. PCCM consulted 08/03/23- s/p surgery overnight, alert to self but drowsy with mild AKI and metabolic acidosis.  Lethargy noted have dcd cefepime  and started doxycycline  instead.  08/04/23- patient more alert able to wiggle toes to verbal.  Remains critically ill.  08/05/23- patient appears to have hypoactive delerium with waxing and waning sensorium.  At times she asks for things other times she's poorly responsive.  Her oxygenation has improved with nasal canula 5L/min.  S/p heme/onc evaluation today 08/06/23- patient seen at bedside, she was able to whisper few words tome.  I discussed PO intake with speech pathologist and SLP with barium was ordered however patient unable to complete this study. She is still very weak physically barely able to wiggle toes.  H/h <7 s/p prbc.  Renal function is improved.  08/07/23- patient had worsening hypoxemia with CXR showing worsening complete left opacification. I discussed this with neurosurgery and they stated her cervical spine did not have any new hardware so intubation should not be complicated. I met with PA Edsel Goods at bedside  to review care plan. We discussed these findings with husband and proceeded with intubation and bronchoscopy.  The entire left lung appeared completely full of mucus impaction in both upper and lower  lobes during bronchoscopy.  CXR post bronchoscopy is in process.  08/08/23- patient is  extubated and is able to speak few words with family.  She moved her feet and right hand today.  I met with husband and son at bedside reviewed imaging and bloodwork. 08/09/23- patient had severe event overnight with hypoxemia , CXR This am with complete opacification of left lung again.  She went into respiratory distress overnight with severe hypoxemia.  She was emergently intubated overnight. WE notified familly and discussed events and imaging findings. She is s/p bronch with improved CXR post procedure.  08/10/23 - remains weak and minimally responsive, flaccid upper and lower extremities 08/11/23 - MRI performed overnight. Remains minimally responsive but does open eyes and track. PEG tube placed by surgery 08/12/23 - acute drop in hemoglobin. Mental status unchanged 08/13/23 - non-responsive. Remains weak, but moving upper extremity non-purposefully 08/14/23 - opens eyes, moves arms non-purposefully  Objective   Blood pressure (!) 139/59, pulse 66, temperature 97.7 F (36.5 C), temperature source Esophageal, resp. rate (!) 22, height 5' 7 (1.702 m), weight 70.4 kg, SpO2 94%.    Vent Mode: PRVC FiO2 (%):  [30 %] 30 % Set Rate:  [16 bmp] 16 bmp Vt Set:  [450 mL] 450 mL PEEP:  [8 cmH20] 8 cmH20 Pressure Support:  [5 cmH20] 5 cmH20 Plateau Pressure:  [25 cmH20-27 cmH20] 27 cmH20   Intake/Output Summary (Last 24 hours) at 08/14/2023 9378 Last data filed at 08/14/2023 0400 Gross per 24 hour  Intake 2634.25 ml  Output 1450 ml  Net 1184.25 ml   Filed Weights   08/12/23 0500 08/13/23 0500 08/14/23 0427  Weight: 64.1 kg 64.9 kg 70.4 kg    Examination: Physical Exam Constitutional:      General: She is not in acute distress.    Appearance: She is ill-appearing.  Cardiovascular:     Rate and Rhythm: Normal rate and regular rhythm.     Pulses: Normal pulses.     Heart sounds: Normal heart sounds.  Pulmonary:     Comments: Ventilated breath sounds bilaterally Abdominal:     Palpations: Abdomen  is soft.  Neurological:     Mental Status: She is disoriented.     Motor: Weakness present.     Comments: Opens eyes, tracks, moving upper and lower extremities more this AM but in a non purposeful fashin.      Assessment & Plan:   #Acute Hypoxic Respiratory Failure #Aspiration Pneumonia #Toxic Metabolic Encephalopathy #Cervical Myelopathy s/p C2 dome laminectomy, C3-C6 laminoplasty; complicated by epidural hematoma s/p surgical decompression #Blood loss anemia #AKI on CKD stage III #Metabolic Acidosis #T2DM #Hypothyroidism #Acute Blood Loss Anemia  Neuro - C2 laminectomy, C3-C6 laminoplasty, complicated by epidural hematoma requiring surgical decompression. Course complicated by metabolic encephalopathy with concern for lower and upper extremity weakness. CT head and neck without acute bleed, but given persistent symptoms and history of epidural hematoma an MRI of the brain and spine was obtained with no restricted diffusion suggestive of damage rather than infarct. Able to pull volumes with SBT suggesting preservation of phrenic nerve, but given significant lower and upper extremity weakness I suspect she will be ventilator dependent with a tracheostomy tube for the immediate future. Discontinued sedation and she remains off. More awake this AM.  CV - She is on phenylephrine  and vasopressin  for hypotension, suspect secondary to spinal shock from spinal cord injury. Was having atrial ectopy with nor-epinephrine . Pressor requirements stable. Goal MAP > 65  Pulm - intubated  for respiratory failure secondary to encephalopthy complicated by aspiration pneumonia and white out of the left lung. Now s/p flexible bronchoscopy for therapeutic aspiration of secretions, with improved aeration of the left lung. On mechanical ventilation, and performed well with SBT but mental status and significant upper and lower extremity weakness are barriers to extubation. Continue hypertonic saline and aggressive  pulmonary toilet. We have consulted ENT for consideration of tracheostomy tube placement.  Renal - AKI on CKD, with non-anion gap metabolic acidosis. Appreciate input from nephrology - starting sodium bicarbonate  infusion. Monitoring electrolytes and kidney function closely, avoiding nephrotoxins as able.  GI - PEG tube placed, and initiated on tube feeds. Continue PPI twice daily for now given recent GI bleed (likely ooze due to PEG placement).   Endo - on basal bolus regimen of insulin  with glargine, aspart and sliding scale.  Hem/Onc - enoxaparin  for DVT prophylaxis, discontinued heparin  subQ for DVT prophylaxis given concern for GI bleed following PEG placement. Did have a drop in hemoglobin yesterday requiring PRBC's with appropriate response in H/H.   ID - aspiration pneumonia secondary to encephalopathy and recent surgery with inability to protect the airway. Respiratory cultures previously grew Candida Glabrata, which is likely a colonizer but with rising white count and previous TPN, I was concerned for fungemia. TPN has since been discontinued and broad spectrum antibiotics initiated (Ceftazidime , Vancomycin  discontinued). Blood culture sent and remain negative.  Best Practice (right click and Reselect all SmartList Selections daily)   Diet/type: tubefeeds DVT prophylaxis prophylactic heparin   Pressure ulcer(s): N/A GI prophylaxis: PPI Lines: Central line and yes and it is still needed Foley:  Yes, and it is still needed Code Status:  full code Last date of multidisciplinary goals of care discussion [08/14/2023]  Labs   CBC: Recent Labs  Lab 08/12/23 0405 08/12/23 0759 08/12/23 1758 08/13/23 0440 08/13/23 1618 08/14/23 0411  WBC 15.5*  --  14.1* 11.6* 8.7 10.9*  NEUTROABS  --   --  10.8*  --  6.8  --   HGB 5.5* 5.4* 8.2* 7.6* 6.5* 10.0*  HCT 16.1* 15.9* 23.6* 22.1* 18.9* 28.8*  MCV 89.0  --  87.4 87.0 88.7 88.3  PLT 151  --  119* 107* 98* 104*    Basic Metabolic  Panel: Recent Labs  Lab 08/10/23 0333 08/11/23 0351 08/12/23 0405 08/13/23 0440 08/14/23 0411  NA 142 141 140 144 148*  K 4.6 4.3 4.6 3.9 3.1*  CL 119* 115* 114* 111 111  CO2 14* 14* 16* 22 26  GLUCOSE 174* 271* 253* 166* 156*  BUN 80* 95* 105* 97* 90*  CREATININE 1.56* 1.92* 2.05* 1.77* 1.74*  CALCIUM  8.2* 8.0* 8.4* 8.1* 7.8*  MG 2.7* 2.3 2.3 2.2 2.3  PHOS 4.0 3.8 3.5 4.0 2.9   GFR: Estimated Creatinine Clearance: 31.3 mL/min (A) (by C-G formula based on SCr of 1.74 mg/dL (H)). Recent Labs  Lab 08/09/23 0750 08/10/23 0333 08/12/23 1758 08/13/23 0440 08/13/23 1618 08/14/23 0411  WBC  --    < > 14.1* 11.6* 8.7 10.9*  LATICACIDVEN 0.8  --   --   --   --   --    < > = values in this interval not displayed.    Liver Function Tests: Recent Labs  Lab 08/09/23 0351 08/10/23 0333 08/12/23 0405 08/13/23 0440 08/14/23 0411  AST  --  25  --   --   --   ALT  --  14  --   --   --  ALKPHOS  --  59  --   --   --   BILITOT  --  0.6  --   --   --   PROT  --  6.0*  --   --   --   ALBUMIN  3.1* 3.2* 2.6* 2.9* 2.8*   No results for input(s): LIPASE, AMYLASE in the last 168 hours. No results for input(s): AMMONIA in the last 168 hours.  ABG    Component Value Date/Time   PHART 7.23 (L) 08/09/2023 0637   PCO2ART 31 (L) 08/09/2023 0637   PO2ART 88 08/09/2023 0637   HCO3 13.0 (L) 08/09/2023 0637   ACIDBASEDEF 13.3 (H) 08/09/2023 0637   O2SAT 98.5 08/09/2023 0637     Coagulation Profile: No results for input(s): INR, PROTIME in the last 168 hours.   Cardiac Enzymes: No results for input(s): CKTOTAL, CKMB, CKMBINDEX, TROPONINI in the last 168 hours.  HbA1C: Hemoglobin A1C  Date/Time Value Ref Range Status  02/20/2013 04:02 AM 10.3 (H) 4.2 - 6.3 % Final    Comment:    The American Diabetes Association recommends that a primary goal of therapy should be <7% and that physicians should reevaluate the treatment regimen in patients with HbA1c values  consistently >8%.    Hgb A1c MFr Bld  Date/Time Value Ref Range Status  04/13/2023 11:34 AM 5.9 (H) 4.8 - 5.6 % Final    Comment:    (NOTE) Pre diabetes:          5.7%-6.4%  Diabetes:              >6.4%  Glycemic control for   <7.0% adults with diabetes   08/11/2021 08:39 PM 7.2 (H) 4.8 - 5.6 % Final    Comment:    (NOTE) Pre diabetes:          5.7%-6.4%  Diabetes:              >6.4%  Glycemic control for   <7.0% adults with diabetes     CBG: Recent Labs  Lab 08/13/23 1536 08/13/23 2000 08/13/23 2011 08/13/23 2351 08/14/23 0338  GLUCAP 91 263* 260* 261* 174*    Review of Systems:   Unable to obtain  Past Medical History:  She,  has a past medical history of Acute incomplete quadriplegia (HCC), Acute respiratory failure with hypoxia (HCC), AKI (acute kidney injury) (HCC), Arthritis, Blood dyscrasia, Central cord syndrome (HCC), Dental caries, Diabetes mellitus without complication (HCC), Diverticulosis, Dysphagia, Esophageal stricture, Facial cellulitis, GERD (gastroesophageal reflux disease), Hypothyroidism, Neuropathy, Odynophagia, Pelvic fracture (HCC), Renal insufficiency, Spinal cord compression, post-traumatic (HCC), Spinal cord injury, cervical region (HCC), and Stage 3a chronic kidney disease (CKD) (HCC).   Surgical History:   Past Surgical History:  Procedure Laterality Date   ABDOMINAL HYSTERECTOMY     BILATERAL SALPINGECTOMY Bilateral 03/16/2017   Procedure: BILATERAL SALPINGECTOMY;  Surgeon: Schermerhorn, Debby PARAS, MD;  Location: [facility] ORS;  Service: Gynecology;  Laterality: Bilateral;   BIOPSY  05/03/2023   Procedure: BIOPSY;  Surgeon: Leigh Elspeth SQUIBB, MD;  Location: [facility] ENDOSCOPY;  Service: Gastroenterology;;   COLONOSCOPY WITH PROPOFOL  N/A 08/18/2018   Procedure: COLONOSCOPY WITH PROPOFOL ;  Surgeon: Viktoria Lamar DASEN, MD;  Location: [facility] ENDOSCOPY;  Service: Endoscopy;  Laterality: N/A;   CYSTOCELE REPAIR N/A 03/16/2017   Procedure: ANTERIOR  REPAIR (CYSTOCELE);  Surgeon: Schermerhorn, Debby PARAS, MD;  Location: [facility] ORS;  Service: Gynecology;  Laterality: N/A;   ESOPHAGOGASTRODUODENOSCOPY (EGD) WITH PROPOFOL  N/A 08/18/2018   Procedure: ESOPHAGOGASTRODUODENOSCOPY (EGD) WITH PROPOFOL ;  Surgeon: Viktoria Lamar DASEN, MD;  Location: [facility] ENDOSCOPY;  Service: Endoscopy;  Laterality: N/A;   ESOPHAGOGASTRODUODENOSCOPY (EGD) WITH PROPOFOL  Left 05/01/2023   Procedure: ESOPHAGOGASTRODUODENOSCOPY (EGD) WITH PROPOFOL ;  Surgeon: Leigh Elspeth SQUIBB, MD;  Location: [facility] ENDOSCOPY;  Service: Gastroenterology;  Laterality: Left;   ESOPHAGOGASTRODUODENOSCOPY (EGD) WITH PROPOFOL  N/A 05/03/2023   Procedure: ESOPHAGOGASTRODUODENOSCOPY (EGD) WITH PROPOFOL ;  Surgeon: Leigh Elspeth SQUIBB, MD;  Location[facility] ENDOSCOPY;  Service: Gastroenterology;  Laterality: N/A;   GASTROSTOMY N/A 08/11/2023   Procedure: INSERTION OF GASTROSTOMY TUBE;  Surgeon: Jordis Laneta FALCON, MD;  Location: [facility] ORS;  Service: General;  Laterality: N/A;   POSTERIOR CERVICAL LAMINECTOMY N/A 07/28/2023   Procedure: C2 DOME LAMINECTOMY;  Surgeon: [name] ORN, MD;  Location: [facility] ORS;  Service: Neurosurgery;  Laterality: N/A;   POSTERIOR CERVICAL LAMINECTOMY N/A 08/01/2023   Procedure: POSTERIOR CERVICAL LAMINECTOMY;  Surgeon: [name] ORN, MD;  Location: [facility] ORS;  Service: Neurosurgery;  Laterality: N/A;   TEAR DUCT PROBING Right    TUBAL LIGATION     VAGINAL HYSTERECTOMY N/A 03/16/2017   Procedure: HYSTERECTOMY VAGINAL;  Surgeon: Schermerhorn, Debby PARAS, MD;  Location: [facility] ORS;  Service: Gynecology;  Laterality: N/A;   WISDOM TOOTH EXTRACTION       Social History:   reports that she has never smoked. She has never used smokeless tobacco. She reports that she does not drink alcohol and does not use drugs.   Family History:  Her family history includes Breast cancer in her maternal aunt; Diabetes in her father; Hypertension in her father and mother.   Allergies Allergies  Allergen  Reactions   Amoxicillin -Pot Clavulanate Itching and Anaphylaxis   Ampicillin Rash     Home Medications  Prior to Admission medications   Medication Sig Start Date End Date Taking? Authorizing Provider  acetaminophen  (TYLENOL ) 650 MG CR tablet Take 650 mg by mouth every 8 (eight) hours as needed for pain.   Yes [provider]  chlorhexidine  (HIBICLENS ) 4 % external liquid Apply 15 mLs (1 Application total) topically as directed for 30 doses. Use as directed daily for 5 days every other week for 6 weeks. 07/28/23  Yes Ulis Bottcher, PA-C  cyanocobalamin  (VITAMIN B12) 1000 MCG tablet Take 1,000 mcg by mouth daily.   Yes [provider]  cyclobenzaprine  (FLEXERIL ) 10 MG tablet Take 1 tablet (10 mg total) by mouth at bedtime. 06/15/23  Yes Lovorn, Megan, MD  diclofenac  Sodium (VOLTAREN ) 1 % GEL Apply 2 g topically 4 (four) times daily. Patient taking differently: Apply 2 g topically 4 (four) times daily as needed (pain). 05/11/23  Yes Setzer, Nena PARAS, PA-C  fludrocortisone  (FLORINEF ) 0.1 MG tablet Take 2 tablets (0.2 mg total) by mouth daily. For orthostatic hypotension 06/15/23  Yes Lovorn, Megan, MD  gabapentin  (NEURONTIN ) 100 MG capsule Take 200 mg by mouth 2 (two) times daily.  08/16/15  Yes [provider]  glipiZIDE  (GLUCOTROL ) 5 MG tablet Take 10 mg by mouth daily before breakfast.   Yes [provider]  JARDIANCE  25 MG TABS tablet Take 25 mg by mouth daily.   Yes [provider]  lactase (LACTAID) 3000 units tablet Take 3,000 Units by mouth daily before breakfast.   Yes [provider]  levothyroxine  (SYNTHROID ) 88 MCG tablet Take 88 mcg by mouth daily before breakfast.   Yes [provider]  midodrine  (PROAMATINE ) 10 MG tablet Take 1.5 tablets (15 mg total) by mouth 3 (three) times daily with meals. For orthostatic hypotension 06/15/23  Yes  Lovorn, Megan, MD  Multiple Vitamin (MULTIVITAMIN WITH MINERALS) TABS tablet Take 1 tablet by  mouth daily. 04/22/23  Yes Alexander, Natalie, DO  mupirocin  ointment (BACTROBAN ) 2 % Place 1 Application into the nose 2 (two) times daily for 60 doses. Use as directed 2 times daily for 5 days every other week for 6 weeks. 07/28/23 08/27/23 Yes Ulis Bottcher, PA-C  pioglitazone  (ACTOS ) 30 MG tablet Take 30 mg by mouth daily.   Yes [provider]  Psyllium (METAMUCIL 3 IN 1 DAILY FIBER PO) Take 2 capsules by mouth daily.   Yes [provider]  rosuvastatin  (CRESTOR ) 5 MG tablet Take 5 mg by mouth at bedtime.   Yes [provider]  senna (SENOKOT) 8.6 MG TABS tablet Take 1 tablet (8.6 mg total) by mouth at bedtime. 05/11/23  Yes Setzer, Nena PARAS, PA-C     Critical care time: 38 mintues    Belva November, MD [facility] 08/14/2023 4:27 PM

## 2023-08-15 DIAGNOSIS — J9601 Acute respiratory failure with hypoxia: Secondary | ICD-10-CM | POA: Diagnosis not present

## 2023-08-15 DIAGNOSIS — J690 Pneumonitis due to inhalation of food and vomit: Secondary | ICD-10-CM | POA: Diagnosis not present

## 2023-08-15 DIAGNOSIS — G928 Other toxic encephalopathy: Secondary | ICD-10-CM | POA: Diagnosis not present

## 2023-08-15 DIAGNOSIS — G959 Disease of spinal cord, unspecified: Secondary | ICD-10-CM | POA: Diagnosis not present

## 2023-08-15 LAB — TYPE AND SCREEN
ABO/RH(D): A POS
Antibody Screen: NEGATIVE
Unit division: 0
Unit division: 0
Unit division: 0
Unit division: 0

## 2023-08-15 LAB — BPAM RBC
Blood Product Expiration Date: 202503082359
Blood Product Expiration Date: 202503082359
Blood Product Expiration Date: 202503082359
Blood Product Expiration Date: 202503082359
ISSUE DATE / TIME: 202502050834
ISSUE DATE / TIME: 202502051104
ISSUE DATE / TIME: 202502062058
ISSUE DATE / TIME: 202502070029
Unit Type and Rh: 6200
Unit Type and Rh: 6200
Unit Type and Rh: 6200
Unit Type and Rh: 6200

## 2023-08-15 LAB — RENAL FUNCTION PANEL
Albumin: 2.8 g/dL — ABNORMAL LOW (ref 3.5–5.0)
Anion gap: 10 (ref 5–15)
BUN: 83 mg/dL — ABNORMAL HIGH (ref 8–23)
CO2: 26 mmol/L (ref 22–32)
Calcium: 7.8 mg/dL — ABNORMAL LOW (ref 8.9–10.3)
Chloride: 112 mmol/L — ABNORMAL HIGH (ref 98–111)
Creatinine, Ser: 1.68 mg/dL — ABNORMAL HIGH (ref 0.44–1.00)
GFR, Estimated: 34 mL/min — ABNORMAL LOW (ref >60)
Glucose, Bld: 274 mg/dL — ABNORMAL HIGH (ref 70–99)
Phosphorus: 2.6 mg/dL (ref 2.5–4.6)
Potassium: 4.6 mmol/L (ref 3.5–5.1)
Sodium: 148 mmol/L — ABNORMAL HIGH (ref 135–145)

## 2023-08-15 LAB — CBC
HCT: 30.3 % — ABNORMAL LOW (ref 36.0–46.0)
Hemoglobin: 9.9 g/dL — ABNORMAL LOW (ref 12.0–15.0)
MCH: 30.3 pg (ref 26.0–34.0)
MCHC: 32.7 g/dL (ref 30.0–36.0)
MCV: 92.7 fL (ref 80.0–100.0)
Platelets: 108 10*3/uL — ABNORMAL LOW (ref 150–400)
RBC: 3.27 MIL/uL — ABNORMAL LOW (ref 3.87–5.11)
RDW: 16.7 % — ABNORMAL HIGH (ref 11.5–15.5)
WBC: 8 10*3/uL (ref 4.0–10.5)
nRBC: 0.3 % — ABNORMAL HIGH (ref 0.0–0.2)

## 2023-08-15 LAB — GLUCOSE, CAPILLARY
Glucose-Capillary: 160 mg/dL — ABNORMAL HIGH (ref 70–99)
Glucose-Capillary: 164 mg/dL — ABNORMAL HIGH (ref 70–99)
Glucose-Capillary: 173 mg/dL — ABNORMAL HIGH (ref 70–99)
Glucose-Capillary: 198 mg/dL — ABNORMAL HIGH (ref 70–99)
Glucose-Capillary: 262 mg/dL — ABNORMAL HIGH (ref 70–99)
Glucose-Capillary: 296 mg/dL — ABNORMAL HIGH (ref 70–99)

## 2023-08-15 LAB — CULTURE, BLOOD (ROUTINE X 2)
Culture: NO GROWTH
Culture: NO GROWTH
Special Requests: ADEQUATE
Special Requests: ADEQUATE

## 2023-08-15 LAB — MAGNESIUM: Magnesium: 2.2 mg/dL (ref 1.7–2.4)

## 2023-08-15 MED ORDER — DOCUSATE SODIUM 50 MG/5ML PO LIQD: 100.0000 mg | Freq: Two times a day (BID) | ORAL | Status: DC | PRN

## 2023-08-15 MED ORDER — FREE WATER
200.0000 mL | Status: DC
  Administered 2023-08-15 – 2023-08-17 (×10): 200 mL

## 2023-08-15 MED ORDER — POLYETHYLENE GLYCOL 3350 17 G PO PACK: 17.0000 g | PACK | Freq: Every day | ORAL | Status: DC | PRN

## 2023-08-15 NOTE — Plan of Care (Signed)
 Continuing with plan of care.

## 2023-08-15 NOTE — Progress Notes (Signed)
 [facility]  ROUNDING NOTE   Subjective:   No overnight events.  Tracheostomy being planned for Monday.  02/07 0701 - 02/08 0700 In: 2244.9 [I.V.:504.9; NG/GT:1540; IV Piggyback:200.1] Out: 2130 [Urine:1760; Stool:370] Lab Results  Component Value Date   CREATININE 1.68 (H) 08/15/2023   CREATININE 1.74 (H) 08/14/2023   CREATININE 1.77 (H) 08/13/2023      Objective:  Vital signs in last 24 hours:  Temp:  [96.4 F (35.8 C)-98.4 F (36.9 C)] 98.4 F (36.9 C) (02/08 0945) Pulse Rate:  [52-77] 61 (02/08 0945) Resp:  [8-25] 22 (02/08 0945) BP: (127-158)/(59-74) 152/74 (02/07 1230) SpO2:  [94 %-98 %] 94 % (02/08 0945) Arterial Line BP: (106-156)/(40-64) 119/46 (02/08 0945) FiO2 (%):  [30 %-40 %] 30 % (02/08 0945) Weight:  [67.7 kg] 67.7 kg (02/08 0500)  Weight change: -2.7 kg Filed Weights   08/13/23 0500 08/14/23 0427 08/15/23 0500  Weight: 64.9 kg 70.4 kg 67.7 kg    Intake/Output: I/O last 3 completed shifts: In: 4777.8 [I.V.:1537.4; Blood:770; NG/GT:2170.3; IV Piggyback:300.1] Out: 2880 [Urine:2510; Stool:370]   Intake/Output this shift:  Total I/O In: 70.5 [I.V.:10.5; NG/GT:60] Out: 35 [Stool:35]  Physical Exam: General: Critically ill  Head: Endotracheal tube in place  Neck: Tracheal midline  Lungs:  Pressure Support FiO2 30%  Heart: regular  Abdomen:  Soft, nontender, bowel sounds present  Extremities: no peripheral edema.  Neurologic: Intubated on the ventilator  Skin: No acute rash  Access: No hemodialysis access    Basic Metabolic Panel: Recent Labs  Lab 08/11/23 0351 08/12/23 0405 08/13/23 0440 08/14/23 0411 08/15/23 0429  NA 141 140 144 148* 148*  K 4.3 4.6 3.9 3.1* 4.6  CL 115* 114* 111 111 112*  CO2 14* 16* 22 26 26   GLUCOSE 271* 253* 166* 156* 274*  BUN 95* 105* 97* 90* 83*  CREATININE 1.92* 2.05* 1.77* 1.74* 1.68*  CALCIUM  8.0* 8.4* 8.1* 7.8* 7.8*  MG 2.3 2.3 2.2 2.3 2.2  PHOS 3.8 3.5 4.0 2.9 2.6    Liver  Function Tests: Recent Labs  Lab 08/10/23 0333 08/12/23 0405 08/13/23 0440 08/14/23 0411 08/15/23 0429  AST 25  --   --   --   --   ALT 14  --   --   --   --   ALKPHOS 59  --   --   --   --   BILITOT 0.6  --   --   --   --   PROT 6.0*  --   --   --   --   ALBUMIN  3.2* 2.6* 2.9* 2.8* 2.8*   No results for input(s): LIPASE, AMYLASE in the last 168 hours. No results for input(s): AMMONIA in the last 168 hours.  CBC: Recent Labs  Lab 08/12/23 1758 08/13/23 0440 08/13/23 1618 08/14/23 0411 08/14/23 1336 08/15/23 0429  WBC 14.1* 11.6* 8.7 10.9*  --  8.0  NEUTROABS 10.8*  --  6.8  --   --   --   HGB 8.2* 7.6* 6.5* 10.0* 9.8* 9.9*  HCT 23.6* 22.1* 18.9* 28.8* 28.7* 30.3*  MCV 87.4 87.0 88.7 88.3  --  92.7  PLT 119* 107* 98* 104*  --  108*    Cardiac Enzymes: No results for input(s): CKTOTAL, CKMB, CKMBINDEX, TROPONINI in the last 168 hours.  BNP: Invalid input(s): POCBNP  CBG: Recent Labs  Lab 08/14/23 1536 08/14/23 1936 08/14/23 2329 08/15/23 0400 08/15/23 0733  GLUCAP 232* 237* 307* 296* 173*    Microbiology:  Results for orders placed or performed during the hospital encounter of 07/28/23  MRSA Next Gen by PCR, Nasal     Status: Abnormal   Collection Time: 08/01/23  5:00 AM   Specimen: Nasal Mucosa; Nasal Swab  Result Value Ref Range Status   MRSA by PCR Next Gen DETECTED (A) NOT DETECTED Final    Comment: RESULT CALLED TO, READ BACK BY AND VERIFIED WITH: JOAN WILLIS @0923  08/01/23 MJU (NOTE) The GeneXpert MRSA Assay (FDA approved for NASAL specimens only), is one component of a comprehensive MRSA colonization surveillance program. It is not intended to diagnose MRSA infection nor to guide or monitor treatment for MRSA infections. Test performance is not FDA approved in patients less than 71 years old. Performed at [facility], [address] [place], [place] [address]   Culture, BAL-quantitative w Gram Stain     Status:  Abnormal   Collection Time: 08/07/23 11:57 AM   Specimen: Bronchoalveolar Lavage; Respiratory  Result Value Ref Range Status   Specimen Description   Final    BRONCHIAL ALVEOLAR LAVAGE Performed at [facility], [address], [place], [place] [address]    Special Requests   Final    NONE Performed at [facility], [address] [place], [place] [address]    Gram Stain   Final    MODERATE WBC PRESENT,BOTH PMN AND MONONUCLEAR FEW YEAST Performed at [facility], [address] [place], [place] [address]    Culture 30,000 COLONIES/mL CANDIDA GLABRATA (A)  Final   Report Status 08/10/2023 FINAL  Final  Culture, BAL-quantitative w Gram Stain     Status: Abnormal   Collection Time: 08/09/23 10:36 AM   Specimen: Bronchoalveolar Lavage; Respiratory  Result Value Ref Range Status   Specimen Description   Final    BRONCHIAL ALVEOLAR LAVAGE Performed at [facility], [address] [place], [place] [address]    Special Requests   Final    NONE Performed at [facility], [address] [place], [place] [address]    Gram Stain   Final    MODERATE WBC PRESENT,BOTH PMN AND MONONUCLEAR RARE GRAM POSITIVE COCCI IN PAIRS FEW YEAST Performed at [facility], [address], [place], [place] [address]    Culture 80,000 COLONIES/mL CANDIDA GLABRATA (A)  Final   Report Status 08/12/2023 FINAL  Final  Culture, blood (Routine X 2) w Reflex to ID Panel     Status: None   Collection Time: 08/10/23 11:45 AM   Specimen: BLOOD RIGHT ARM  Result Value Ref Range Status   Specimen Description BLOOD RIGHT ARM  Final   Special Requests   Final    BOTTLES DRAWN AEROBIC AND ANAEROBIC Blood Culture adequate volume   Culture   Final    NO GROWTH 5 DAYS Performed at [facility], [address] [place], [place] [address]    Report Status 08/15/2023 FINAL  Final  Culture, blood (Routine X 2) w Reflex to ID Panel     Status: None    Collection Time: 08/10/23 11:46 AM   Specimen: BLOOD LEFT HAND  Result Value Ref Range Status   Specimen Description BLOOD LEFT HAND  Final   Special Requests   Final    BOTTLES DRAWN AEROBIC AND ANAEROBIC Blood Culture adequate volume   Culture   Final    NO GROWTH 5 DAYS Performed at [facility], [address], [place], [place] [address]  Report Status 08/15/2023 FINAL  Final    Coagulation Studies: Recent Labs    08/14/23 1336  LABPROT 17.0*  INR 1.4*    Urinalysis: No results for input(s): COLORURINE, LABSPEC, PHURINE, GLUCOSEU, HGBUR, BILIRUBINUR, KETONESUR, PROTEINUR, UROBILINOGEN, NITRITE, LEUKOCYTESUR in the last 72 hours.  Invalid input(s): APPERANCEUR    Imaging: US  Venous Img Upper Uni Right(DVT) Result Date: 08/13/2023 CLINICAL DATA:  [id number] Swelling of right upper extremity [id number] EXAM: RIGHT UPPER EXTREMITY VENOUS DOPPLER ULTRASOUND TECHNIQUE: Gray-scale sonography with graded compression, as well as color Doppler and duplex ultrasound were performed to evaluate the upper extremity deep venous system from the level of the subclavian vein and including the jugular, axillary, basilic, radial, ulnar and upper cephalic vein. Spectral Doppler was utilized to evaluate flow at rest and with distal augmentation maneuvers. COMPARISON:  Chest XR, 08/09/2023. FINDINGS: VENOUS Normal compressibility of the RIGHT internal jugular, subclavian, axillary, cephalic, basilic, brachial, radial and ulnar veins. No filling defects to suggest DVT on grayscale or color Doppler imaging. Doppler waveforms show normal direction of venous flow, normal respiratory plasticity and response to augmentation. A catheter is present within the basilic vein. Limited views of the contralateral subclavian vein are unremarkable. OTHER No evidence of superficial thrombophlebitis or abnormal fluid collection. Limitations: none IMPRESSION: 1. No evidence of DVT or superficial  thrombophlebitis within the RIGHT upper extremity. 2. PICC present within the basilic vein. Thom Hall, MD [facility] Radiology Electronically Signed   By: Thom Hall M.D.   On: 08/13/2023 11:14     Medications:    albumin  human 12.5 g (08/15/23 1008)   cefTAZidime  (FORTAZ )  IV Stopped (08/15/23 0440)   feeding supplement (OSMOLITE 1.5 CAL) 60 mL/hr at 08/15/23 0800   phenylephrine  (NEO-SYNEPHRINE) Adult infusion 10 mcg/min (08/15/23 0800)   vasopressin  0.03 Units/min (08/15/23 1006)    sodium chloride    Intravenous Once   sodium chloride    Intravenous Once   Chlorhexidine  Gluconate Cloth  6 each Topical Daily   docusate  100 mg Per Tube BID   feeding supplement (PROSource TF20)  60 mL Per Tube Daily   free water   200 mL Per Tube Q4H   insulin  aspart  0-15 Units Subcutaneous Q4H   insulin  glargine-yfgn  5 Units Subcutaneous Daily   levothyroxine   88 mcg Per Tube QAC breakfast   mouth rinse  15 mL Mouth Rinse Q2H   pantoprazole  (PROTONIX ) IV  40 mg Intravenous Q12H   polyethylene glycol  17 g Per Tube Daily   sodium chloride  flush  10-40 mL Intracatheter Q12H   sodium chloride  flush  3-10 mL Intravenous Q12H   bisacodyl , fentaNYL  (SUBLIMAZE ) injection, fentaNYL  (SUBLIMAZE ) injection, menthol -cetylpyridinium **OR** [DISCONTINUED] phenol, mouth rinse, sodium chloride  flush, sodium chloride  flush  Assessment/ Plan:  68 y.o. female with a PMHx of acute incomplete quadriplegia, history of easy bleeding, central cord syndrome, diabetes mellitus type 2, diverticulosis, GERD, hypothyroidism, peripheral neuropathy, who was admitted to [facility] on 07/28/2023 for C2 dome laminectomy, C3-6 laminoplasty with postoperative bleeding and epidural hematoma causing spinal cord compression s/p evacuation 08/01/2023 who we are asked to see now for acute kidney injury.   1.  Acute kidney injury/chronic kidney disease stage II baseline creatinine 0.75.  Suspect  acute kidney injury now related to relative hypotension. 02/07 0701 - 02/08 0700 In: 2244.9 [I.V.:504.9; NG/GT:1540; IV Piggyback:200.1] Out: 2130 [Urine:1760; Stool:370] Lab Results  Component Value Date   CREATININE 1.68 (H) 08/15/2023   CREATININE 1.74 (H) 08/14/2023  CREATININE 1.77 (H) 08/13/2023  Patient continues to have good urine output.  Creatinine has stabilized at 1.7.  No immediate need for dialysis.  Continue to monitor renal parameters.   2.  Anemia of chronic kidney disease.  Hemoglobin was up to 10 status post transfusion.  Continue to monitor CBC closely.  3.  Acute respiratory failure. patient maintained on vent support and ENT planning for tracheostomy on Monday.   LOS: 18 Goro Wenrick 2/8/202510:40 AM

## 2023-08-15 NOTE — Progress Notes (Signed)
 Neurosurgery visit note  Patient seen and examined.  No overnight events.  There is currently a plan for tracheostomy to be performed on Monday.  Per nursing the patient has had more spontaneous upper extremity movements have been weakness particularly flexion the bilateral elbows.  There is also been report of potential bilateral lower extremity twitching movement but unclear the patient remains intubated hemodynamically stable  On examination the patient was seen to have spontaneous upper extremity flexion at the elbows though not quite to the level of localization.  Bilateral lower extremity movement is minimal I do not see any hyperreflexia or reflexes at the patella bilaterally toes are mute bilateral and there is no flexion or withdraw to bilateral noxious stimuli in the bilateral lower extremities. Cervical incision and midline incisions are clean dry and intact  AP: Appreciate the excellent care of the intensive care unit and ongoing monitoring of her respiratory status.  Neurosurgery will continue to follow along closely and is encouraging that there has been some improvement in her bilateral upper extremity exam we will continue to monitor her lower extremity exam.  Cheryl JINNY Felix  MD Neurosurgery

## 2023-08-15 NOTE — Progress Notes (Signed)
 NAME:  [name], MRN:  [id number], DOB:  1955/10/02, LOS: 18 ADMISSION DATE:  07/28/2023, CHIEF COMPLAINT:  Cervical Myelopathy   History of Present Illness:   [name] is a 68 year old old female with PMHx of hypothyroidism, insulin -dependent DM, CKD stage III, dysphagia with esophageal strictures, facial cellulitis, GERD, neuropathy, arthritis, blood dyscrasia, and recent traumatic cervical central cord injury who presented to the hospital for elective cervical spine surgery.   Per patient's chart, patient was seen by neurosurgery on 07/28/2023 for follow-up after suffering a fall 3 months ago in which she sustained a cervical spinal cord injury.  Per patient, they deferred emergent surgical depression and wanted to let her have time to recover and work with physical therapy.  She continued to have hand weakness as well as lower extremity weakness and ambulatory issues.  Given the ongoing compression of C3-C6, laminoplasty and C2 dome laminectomy was indicated.     Hospital course: Patient underwent C3-6 laminoplasty and C2 dome and laminectomy surgery on 07/29/2023 with drain placement.   Pertinent Labs/Diagnostics Findings: Na+/ K+: 141/4.4 Glucose: 136 BUN/Cr.47/1.42 WBC: 5.8 K/L  Hgb/Hct: 7.4/22.4  Pertinent  Medical History  Hypothyroidism, insulin -dependent DM, CKD stage III, dysphagia with esophageal strictures, facial cellulitis, GERD, neuropathy, arthritis, blood dyscrasia, and recent traumatic cervical central cord injury  Significant Hospital Events: Including procedures, antibiotic start and stop dates in addition to other pertinent events   01/21: Patient underwent posterior C3-6 Cervical Laminoplasty with C2 inferior dome Laminectomy 01/22: POD #1. Pt. stable with no acute changes.  Working with PT and OT 01/23: POD #2.  Pt. noted to be very lethargic and difficult to arouse 01/24: POD #3. Pt. developed hypoxia tachycardia and shortness of breath.  Hospitalist  consulted CT chest obtained and showed possible pneumonia.  Started on IV antibiotics and DuoNebs. 01/25: POD #4. Pt. with new concerns of not moving the left arm.  CT head and cervical spine obtained with no new concerns.  Follow-up MRI however demonstrated epidural hematoma extending from the cervical spine to the lower thoracic spine with acute compression and T2 signal.  Taken emergently to the OR for surgical decompression 01/26: Pt. transferred to the ICU s/p emergent cervical decompression and evacuation of epidural hematoma.  Remained intubated. PCCM consulted 08/03/23- s/p surgery overnight, alert to self but drowsy with mild AKI and metabolic acidosis.  Lethargy noted have dcd cefepime  and started doxycycline  instead.  08/04/23- patient more alert able to wiggle toes to verbal.  Remains critically ill.  08/05/23- patient appears to have hypoactive delerium with waxing and waning sensorium.  At times she asks for things other times she's poorly responsive.  Her oxygenation has improved with nasal canula 5L/min.  S/p heme/onc evaluation today 08/06/23- patient seen at bedside, she was able to whisper few words tome.  I discussed PO intake with speech pathologist and SLP with barium was ordered however patient unable to complete this study. She is still very weak physically barely able to wiggle toes.  H/h <7 s/p prbc.  Renal function is improved.  08/07/23- patient had worsening hypoxemia with CXR showing worsening complete left opacification. I discussed this with neurosurgery and they stated her cervical spine did not have any new hardware so intubation should not be complicated. I met with PA Edsel Goods at bedside  to review care plan. We discussed these findings with husband and proceeded with intubation and bronchoscopy.  The entire left lung appeared completely full of mucus impaction in both upper and lower  lobes during bronchoscopy.  CXR post bronchoscopy is in process.  08/08/23- patient is  extubated and is able to speak few words with family.  She moved her feet and right hand today.  I met with husband and son at bedside reviewed imaging and bloodwork. 08/09/23- patient had severe event overnight with hypoxemia , CXR This am with complete opacification of left lung again.  She went into respiratory distress overnight with severe hypoxemia.  She was emergently intubated overnight. WE notified familly and discussed events and imaging findings. She is s/p bronch with improved CXR post procedure.  08/10/23 - remains weak and minimally responsive, flaccid upper and lower extremities 08/11/23 - MRI performed overnight. Remains minimally responsive but does open eyes and track. PEG tube placed by surgery 08/12/23 - acute drop in hemoglobin. Mental status unchanged 08/13/23 - non-responsive. Remains weak, but moving upper extremity non-purposefully 08/14/23 - opens eyes, moves arms non-purposefully 08/15/23 - opens eyes, moving upper extremities, doesn't follow commands  Objective   Blood pressure (!) 152/74, pulse 64, temperature 97.9 F (36.6 C), temperature source Esophageal, resp. rate (!) 25, height 5' 7 (1.702 m), weight 67.7 kg, SpO2 94%.    Vent Mode: PSV FiO2 (%):  [30 %-40 %] 30 % Set Rate:  [16 bmp] 16 bmp Vt Set:  [450 mL] 450 mL PEEP:  [8 cmH20] 8 cmH20 Pressure Support:  [5 cmH20] 5 cmH20 Plateau Pressure:  [25 cmH20] 25 cmH20   Intake/Output Summary (Last 24 hours) at 08/15/2023 0900 Last data filed at 08/15/2023 0800 Gross per 24 hour  Intake 2040.38 ml  Output 2005 ml  Net 35.38 ml   Filed Weights   08/13/23 0500 08/14/23 0427 08/15/23 0500  Weight: 64.9 kg 70.4 kg 67.7 kg    Examination: Physical Exam Constitutional:      General: She is not in acute distress.    Appearance: She is ill-appearing.  Cardiovascular:     Rate and Rhythm: Normal rate and regular rhythm.     Pulses: Normal pulses.     Heart sounds: Normal heart sounds.  Pulmonary:     Comments:  Ventilated breath sounds bilaterally Abdominal:     Palpations: Abdomen is soft.  Neurological:     Mental Status: She is disoriented.     Motor: Weakness present.     Comments: Opens eyes, tracks, moving arms non-purposefully      Assessment & Plan:   #Acute Hypoxic Respiratory Failure #Aspiration Pneumonia #Toxic Metabolic Encephalopathy #Cervical Myelopathy s/p C2 dome laminectomy, C3-C6 laminoplasty; complicated by epidural hematoma s/p surgical decompression #Blood loss anemia #AKI on CKD stage III #Metabolic Acidosis #T2DM #Hypothyroidism #Acute Blood Loss Anemia  Neuro - C2 laminectomy, C3-C6 laminoplasty, complicated by epidural hematoma requiring surgical decompression. Course complicated by metabolic encephalopathy with concern for lower and upper extremity weakness. CT head and neck without acute bleed, but given persistent symptoms and history of epidural hematoma an MRI of the brain and spine was obtained with no restricted diffusion suggestive of damage rather than infarct. Able to pull volumes with SBT suggesting preservation of phrenic nerve, but given significant lower and upper extremity weakness I suspect she will be ventilator dependent and will need a tracheostomy tube placed to aid in recovery. Continue to hold sedation, PRN fentanyl  for pain ordered.   CV - She is on phenylephrine  and vasopressin  for hypotension, suspect secondary to spinal shock from spinal cord injury. Was having atrial ectopy with nor-epinephrine . Pressor requirements stable. Goal MAP > 65  Pulm -  intubated for respiratory failure secondary to encephalopthy complicated by aspiration pneumonia and white out of the left lung. Now s/p flexible bronchoscopy for therapeutic aspiration of secretions, with improved aeration of the left lung. On mechanical ventilation, and performed well with SBT but mental status and significant upper and lower extremity weakness are barriers to extubation. She's also  had increased secretions today. Continue hypertonic saline and aggressive pulmonary toilet. We have consulted ENT for consideration of tracheostomy tube placement.  Renal - AKI on CKD, with non-anion gap metabolic acidosis. Appreciate input from nephrology - on sodium bicarbonate  infusion. Monitoring electrolytes and kidney function closely, avoiding nephrotoxins as able.  GI - PEG tube placed, and initiated on tube feeds. Continue PPI twice daily for now given recent GI bleed (likely ooze due to PEG placement).   Endo - on basal bolus regimen of insulin  with glargine, aspart and sliding scale.  Hem/Onc - enoxaparin  for DVT prophylaxis, discontinued heparin  subQ for DVT prophylaxis given concern for GI bleed following PEG placement. Did have a drop in hemoglobin yesterday requiring PRBC's with appropriate response in H/H. Appreciate input from hematology, will administer FFP's prior to tracheostomy on Monday.  ID - aspiration pneumonia secondary to encephalopathy and recent surgery with inability to protect the airway. Respiratory cultures previously grew Candida Glabrata, which is likely a colonizer but with rising white count and previous TPN, I was concerned for fungemia. TPN has since been discontinued and broad spectrum antibiotics initiated (Ceftazidime , Vancomycin  discontinued). Blood culture sent and remain negative. Will re-send respiratory cultures.  Best Practice (right click and Reselect all SmartList Selections daily)   Diet/type: tubefeeds DVT prophylaxis prophylactic heparin   Pressure ulcer(s): N/A GI prophylaxis: PPI Lines: Central line and yes and it is still needed Foley:  Yes, and it is still needed Code Status:  full code Last date of multidisciplinary goals of care discussion [08/15/2023]  Labs   CBC: Recent Labs  Lab 08/12/23 1758 08/13/23 0440 08/13/23 1618 08/14/23 0411 08/14/23 1336 08/15/23 0429  WBC 14.1* 11.6* 8.7 10.9*  --  8.0  NEUTROABS 10.8*  --  6.8   --   --   --   HGB 8.2* 7.6* 6.5* 10.0* 9.8* 9.9*  HCT 23.6* 22.1* 18.9* 28.8* 28.7* 30.3*  MCV 87.4 87.0 88.7 88.3  --  92.7  PLT 119* 107* 98* 104*  --  108*    Basic Metabolic Panel: Recent Labs  Lab 08/11/23 0351 08/12/23 0405 08/13/23 0440 08/14/23 0411 08/15/23 0429  NA 141 140 144 148* 148*  K 4.3 4.6 3.9 3.1* 4.6  CL 115* 114* 111 111 112*  CO2 14* 16* 22 26 26   GLUCOSE 271* 253* 166* 156* 274*  BUN 95* 105* 97* 90* 83*  CREATININE 1.92* 2.05* 1.77* 1.74* 1.68*  CALCIUM  8.0* 8.4* 8.1* 7.8* 7.8*  MG 2.3 2.3 2.2 2.3 2.2  PHOS 3.8 3.5 4.0 2.9 2.6   GFR: Estimated Creatinine Clearance: 32.5 mL/min (A) (by C-G formula based on SCr of 1.68 mg/dL (H)). Recent Labs  Lab 08/09/23 0750 08/10/23 0333 08/13/23 0440 08/13/23 1618 08/14/23 0411 08/15/23 0429  WBC  --    < > 11.6* 8.7 10.9* 8.0  LATICACIDVEN 0.8  --   --   --   --   --    < > = values in this interval not displayed.    Liver Function Tests: Recent Labs  Lab 08/10/23 9666 08/12/23 0405 08/13/23 0440 08/14/23 0411 08/15/23 0429  AST 25  --   --   --   --  ALT 14  --   --   --   --   ALKPHOS 59  --   --   --   --   BILITOT 0.6  --   --   --   --   PROT 6.0*  --   --   --   --   ALBUMIN  3.2* 2.6* 2.9* 2.8* 2.8*   No results for input(s): LIPASE, AMYLASE in the last 168 hours. No results for input(s): AMMONIA in the last 168 hours.  ABG    Component Value Date/Time   PHART 7.23 (L) 08/09/2023 0637   PCO2ART 31 (L) 08/09/2023 0637   PO2ART 88 08/09/2023 0637   HCO3 13.0 (L) 08/09/2023 0637   ACIDBASEDEF 13.3 (H) 08/09/2023 0637   O2SAT 98.5 08/09/2023 0637     Coagulation Profile: Recent Labs  Lab 08/14/23 1336  INR 1.4*     Cardiac Enzymes: No results for input(s): CKTOTAL, CKMB, CKMBINDEX, TROPONINI in the last 168 hours.  HbA1C: Hemoglobin A1C  Date/Time Value Ref Range Status  02/20/2013 04:02 AM 10.3 (H) 4.2 - 6.3 % Final    Comment:    The American Diabetes  Association recommends that a primary goal of therapy should be <7% and that physicians should reevaluate the treatment regimen in patients with HbA1c values consistently >8%.    Hgb A1c MFr Bld  Date/Time Value Ref Range Status  04/13/2023 11:34 AM 5.9 (H) 4.8 - 5.6 % Final    Comment:    (NOTE) Pre diabetes:          5.7%-6.4%  Diabetes:              >6.4%  Glycemic control for   <7.0% adults with diabetes   08/11/2021 08:39 PM 7.2 (H) 4.8 - 5.6 % Final    Comment:    (NOTE) Pre diabetes:          5.7%-6.4%  Diabetes:              >6.4%  Glycemic control for   <7.0% adults with diabetes     CBG: Recent Labs  Lab 08/14/23 1536 08/14/23 1936 08/14/23 2329 08/15/23 0400 08/15/23 0733  GLUCAP 232* 237* 307* 296* 173*    Review of Systems:   Unable to obtain  Past Medical History:  She,  has a past medical history of Acute incomplete quadriplegia (HCC), Acute respiratory failure with hypoxia (HCC), AKI (acute kidney injury) (HCC), Arthritis, Blood dyscrasia, Central cord syndrome (HCC), Dental caries, Diabetes mellitus without complication (HCC), Diverticulosis, Dysphagia, Esophageal stricture, Facial cellulitis, GERD (gastroesophageal reflux disease), Hypothyroidism, Neuropathy, Odynophagia, Pelvic fracture (HCC), Renal insufficiency, Spinal cord compression, post-traumatic (HCC), Spinal cord injury, cervical region (HCC), and Stage 3a chronic kidney disease (CKD) (HCC).   Surgical History:   Past Surgical History:  Procedure Laterality Date   ABDOMINAL HYSTERECTOMY     BILATERAL SALPINGECTOMY Bilateral 03/16/2017   Procedure: BILATERAL SALPINGECTOMY;  Surgeon: Schermerhorn, Debby PARAS, MD;  Location: [facility] ORS;  Service: Gynecology;  Laterality: Bilateral;   BIOPSY  05/03/2023   Procedure: BIOPSY;  Surgeon: Leigh Elspeth SQUIBB, MD;  Location: [facility] ENDOSCOPY;  Service: Gastroenterology;;   COLONOSCOPY WITH PROPOFOL  N/A 08/18/2018   Procedure: COLONOSCOPY WITH PROPOFOL ;   Surgeon: Viktoria Lamar DASEN, MD;  Location: [facility] ENDOSCOPY;  Service: Endoscopy;  Laterality: N/A;   CYSTOCELE REPAIR N/A 03/16/2017   Procedure: ANTERIOR REPAIR (CYSTOCELE);  Surgeon: Schermerhorn, Debby PARAS, MD;  Location: [facility] ORS;  Service: Gynecology;  Laterality: N/A;  ESOPHAGOGASTRODUODENOSCOPY (EGD) WITH PROPOFOL  N/A 08/18/2018   Procedure: ESOPHAGOGASTRODUODENOSCOPY (EGD) WITH PROPOFOL ;  Surgeon: Viktoria Lamar DASEN, MD;  Location: [facility] ENDOSCOPY;  Service: Endoscopy;  Laterality: N/A;   ESOPHAGOGASTRODUODENOSCOPY (EGD) WITH PROPOFOL  Left 05/01/2023   Procedure: ESOPHAGOGASTRODUODENOSCOPY (EGD) WITH PROPOFOL ;  Surgeon: Leigh Elspeth SQUIBB, MD;  Location: [facility] ENDOSCOPY;  Service: Gastroenterology;  Laterality: Left;   ESOPHAGOGASTRODUODENOSCOPY (EGD) WITH PROPOFOL  N/A 05/03/2023   Procedure: ESOPHAGOGASTRODUODENOSCOPY (EGD) WITH PROPOFOL ;  Surgeon: Leigh Elspeth SQUIBB, MD;  Location: [facility] ENDOSCOPY;  Service: Gastroenterology;  Laterality: N/A;   GASTROSTOMY N/A 08/11/2023   Procedure: INSERTION OF GASTROSTOMY TUBE;  Surgeon: Jordis Laneta FALCON, MD;  Location: [facility] ORS;  Service: General;  Laterality: N/A;   POSTERIOR CERVICAL LAMINECTOMY N/A 07/28/2023   Procedure: C2 DOME LAMINECTOMY;  Surgeon: [name] ORN, MD;  Location: [facility] ORS;  Service: Neurosurgery;  Laterality: N/A;   POSTERIOR CERVICAL LAMINECTOMY N/A 08/01/2023   Procedure: POSTERIOR CERVICAL LAMINECTOMY;  Surgeon: [name] ORN, MD;  Location: [facility] ORS;  Service: Neurosurgery;  Laterality: N/A;   TEAR DUCT PROBING Right    TUBAL LIGATION     VAGINAL HYSTERECTOMY N/A 03/16/2017   Procedure: HYSTERECTOMY VAGINAL;  Surgeon: Schermerhorn, Debby PARAS, MD;  Location: [facility] ORS;  Service: Gynecology;  Laterality: N/A;   WISDOM TOOTH EXTRACTION       Social History:   reports that she has never smoked. She has never used smokeless tobacco. She reports that she does not drink alcohol and does not use drugs.   Family History:  Her family  history includes Breast cancer in her maternal aunt; Diabetes in her father; Hypertension in her father and mother.   Allergies Allergies  Allergen Reactions   Amoxicillin -Pot Clavulanate Itching and Anaphylaxis   Ampicillin Rash     Home Medications  Prior to Admission medications   Medication Sig Start Date End Date Taking? Authorizing Provider  acetaminophen  (TYLENOL ) 650 MG CR tablet Take 650 mg by mouth every 8 (eight) hours as needed for pain.   Yes [provider]  chlorhexidine  (HIBICLENS ) 4 % external liquid Apply 15 mLs (1 Application total) topically as directed for 30 doses. Use as directed daily for 5 days every other week for 6 weeks. 07/28/23  Yes Ulis Bottcher, PA-C  cyanocobalamin  (VITAMIN B12) 1000 MCG tablet Take 1,000 mcg by mouth daily.   Yes [provider]  cyclobenzaprine  (FLEXERIL ) 10 MG tablet Take 1 tablet (10 mg total) by mouth at bedtime. 06/15/23  Yes Lovorn, Megan, MD  diclofenac  Sodium (VOLTAREN ) 1 % GEL Apply 2 g topically 4 (four) times daily. Patient taking differently: Apply 2 g topically 4 (four) times daily as needed (pain). 05/11/23  Yes Setzer, Nena PARAS, PA-C  fludrocortisone  (FLORINEF ) 0.1 MG tablet Take 2 tablets (0.2 mg total) by mouth daily. For orthostatic hypotension 06/15/23  Yes Lovorn, Megan, MD  gabapentin  (NEURONTIN ) 100 MG capsule Take 200 mg by mouth 2 (two) times daily.  08/16/15  Yes [provider]  glipiZIDE  (GLUCOTROL ) 5 MG tablet Take 10 mg by mouth daily before breakfast.   Yes [provider]  JARDIANCE  25 MG TABS tablet Take 25 mg by mouth daily.   Yes [provider]  lactase (LACTAID) 3000 units tablet Take 3,000 Units by mouth daily before breakfast.   Yes [provider]  levothyroxine  (SYNTHROID ) 88 MCG tablet Take 88 mcg by mouth daily before breakfast.   Yes [provider]  midodrine  (PROAMATINE ) 10 MG tablet Take 1.5 tablets (15 mg total)  by mouth 3 (three) times  daily with meals. For orthostatic hypotension 06/15/23  Yes Lovorn, Megan, MD  Multiple Vitamin (MULTIVITAMIN WITH MINERALS) TABS tablet Take 1 tablet by mouth daily. 04/22/23  Yes Alexander, Natalie, DO  mupirocin  ointment (BACTROBAN ) 2 % Place 1 Application into the nose 2 (two) times daily for 60 doses. Use as directed 2 times daily for 5 days every other week for 6 weeks. 07/28/23 08/27/23 Yes Ulis Bottcher, PA-C  pioglitazone  (ACTOS ) 30 MG tablet Take 30 mg by mouth daily.   Yes [provider]  Psyllium (METAMUCIL 3 IN 1 DAILY FIBER PO) Take 2 capsules by mouth daily.   Yes [provider]  rosuvastatin  (CRESTOR ) 5 MG tablet Take 5 mg by mouth at bedtime.   Yes [provider]  senna (SENOKOT) 8.6 MG TABS tablet Take 1 tablet (8.6 mg total) by mouth at bedtime. 05/11/23  Yes Setzer, Nena PARAS, PA-C     Critical care time: 32 minutes    Belva November, MD [facility] 08/15/2023 9:43 AM

## 2023-08-16 DIAGNOSIS — J690 Pneumonitis due to inhalation of food and vomit: Secondary | ICD-10-CM | POA: Diagnosis not present

## 2023-08-16 DIAGNOSIS — G928 Other toxic encephalopathy: Secondary | ICD-10-CM | POA: Diagnosis not present

## 2023-08-16 DIAGNOSIS — J9601 Acute respiratory failure with hypoxia: Secondary | ICD-10-CM | POA: Diagnosis not present

## 2023-08-16 DIAGNOSIS — G959 Disease of spinal cord, unspecified: Secondary | ICD-10-CM | POA: Diagnosis not present

## 2023-08-16 LAB — GLUCOSE, CAPILLARY
Glucose-Capillary: 199 mg/dL — ABNORMAL HIGH (ref 70–99)
Glucose-Capillary: 235 mg/dL — ABNORMAL HIGH (ref 70–99)
Glucose-Capillary: 262 mg/dL — ABNORMAL HIGH (ref 70–99)
Glucose-Capillary: 266 mg/dL — ABNORMAL HIGH (ref 70–99)
Glucose-Capillary: 267 mg/dL — ABNORMAL HIGH (ref 70–99)
Glucose-Capillary: 296 mg/dL — ABNORMAL HIGH (ref 70–99)

## 2023-08-16 LAB — CBC
HCT: 30.2 % — ABNORMAL LOW (ref 36.0–46.0)
Hemoglobin: 9.7 g/dL — ABNORMAL LOW (ref 12.0–15.0)
MCH: 30.2 pg (ref 26.0–34.0)
MCHC: 32.1 g/dL (ref 30.0–36.0)
MCV: 94.1 fL (ref 80.0–100.0)
Platelets: 107 10*3/uL — ABNORMAL LOW (ref 150–400)
RBC: 3.21 MIL/uL — ABNORMAL LOW (ref 3.87–5.11)
RDW: 16.5 % — ABNORMAL HIGH (ref 11.5–15.5)
WBC: 7.2 10*3/uL (ref 4.0–10.5)
nRBC: 0 % (ref 0.0–0.2)

## 2023-08-16 LAB — RENAL FUNCTION PANEL
Albumin: 2.9 g/dL — ABNORMAL LOW (ref 3.5–5.0)
Anion gap: 11 (ref 5–15)
BUN: 82 mg/dL — ABNORMAL HIGH (ref 8–23)
CO2: 24 mmol/L (ref 22–32)
Calcium: 8 mg/dL — ABNORMAL LOW (ref 8.9–10.3)
Chloride: 112 mmol/L — ABNORMAL HIGH (ref 98–111)
Creatinine, Ser: 1.66 mg/dL — ABNORMAL HIGH (ref 0.44–1.00)
GFR, Estimated: 34 mL/min — ABNORMAL LOW (ref >60)
Glucose, Bld: 241 mg/dL — ABNORMAL HIGH (ref 70–99)
Phosphorus: 1.7 mg/dL — ABNORMAL LOW (ref 2.5–4.6)
Potassium: 4.9 mmol/L (ref 3.5–5.1)
Sodium: 147 mmol/L — ABNORMAL HIGH (ref 135–145)

## 2023-08-16 MED ORDER — SODIUM CHLORIDE 0.9% FLUSH
3.0000 mL | Freq: Two times a day (BID) | INTRAVENOUS | Status: DC
  Administered 2023-08-16: 3 mL via INTRAVENOUS
  Administered 2023-08-16 – 2023-08-17 (×3): 10 mL via INTRAVENOUS
  Administered 2023-08-18: 3 mL via INTRAVENOUS
  Administered 2023-08-18: 8 mL via INTRAVENOUS
  Administered 2023-08-19: 3 mL via INTRAVENOUS
  Administered 2023-08-20 – 2023-08-21 (×3): 10 mL via INTRAVENOUS

## 2023-08-16 MED ORDER — SODIUM CHLORIDE 0.9% FLUSH: 3.0000 mL | INTRAVENOUS | Status: DC | PRN

## 2023-08-16 MED ORDER — DEXTROSE 5 % IV SOLN: INTRAVENOUS | Status: AC

## 2023-08-16 MED ORDER — POTASSIUM & SODIUM PHOSPHATES 280-160-250 MG PO PACK
2.0000 | PACK | Freq: Once | ORAL | Status: AC
  Administered 2023-08-16: 2
  Filled 2023-08-16: qty 2

## 2023-08-16 MED ORDER — ACETAMINOPHEN 325 MG PO TABS
650.0000 mg | ORAL_TABLET | Freq: Four times a day (QID) | ORAL | Status: DC | PRN
  Administered 2023-08-16: 650 mg
  Filled 2023-08-16: qty 2

## 2023-08-16 NOTE — Progress Notes (Signed)
 Patient was able to only require vasopressin  gtt for this shift to maintain B/P and arterial line removed earlier in the shift.  Dr. Milissa notified this nurse earlier in the shift for patients feeds to be stopped at midnight, will endorse to oncoming shift.

## 2023-08-16 NOTE — Progress Notes (Signed)
 NAME:  [name], MRN:  [id number], DOB:  1956/06/18, LOS: 19 ADMISSION DATE:  07/28/2023, CHIEF COMPLAINT:  Cervical Myelopathy   History of Present Illness:   [name] is a 68 year old old female with PMHx of hypothyroidism, insulin -dependent DM, CKD stage III, dysphagia with esophageal strictures, facial cellulitis, GERD, neuropathy, arthritis, blood dyscrasia, and recent traumatic cervical central cord injury who presented to the hospital for elective cervical spine surgery.   Per patient's chart, patient was seen by neurosurgery on 07/28/2023 for follow-up after suffering a fall 3 months ago in which she sustained a cervical spinal cord injury.  Per patient, they deferred emergent surgical depression and wanted to let her have time to recover and work with physical therapy.  She continued to have hand weakness as well as lower extremity weakness and ambulatory issues.  Given the ongoing compression of C3-C6, laminoplasty and C2 dome laminectomy was indicated.     Hospital course: Patient underwent C3-6 laminoplasty and C2 dome and laminectomy surgery on 07/29/2023 with drain placement.   Pertinent Labs/Diagnostics Findings: Na+/ K+: 141/4.4 Glucose: 136 BUN/Cr.47/1.42 WBC: 5.8 K/L  Hgb/Hct: 7.4/22.4  Pertinent  Medical History  Hypothyroidism, insulin -dependent DM, CKD stage III, dysphagia with esophageal strictures, facial cellulitis, GERD, neuropathy, arthritis, blood dyscrasia, and recent traumatic cervical central cord injury  Significant Hospital Events: Including procedures, antibiotic start and stop dates in addition to other pertinent events   01/21: Patient underwent posterior C3-6 Cervical Laminoplasty with C2 inferior dome Laminectomy 01/22: POD #1. Pt. stable with no acute changes.  Working with PT and OT 01/23: POD #2.  Pt. noted to be very lethargic and difficult to arouse 01/24: POD #3. Pt. developed hypoxia tachycardia and shortness of breath.  Hospitalist  consulted CT chest obtained and showed possible pneumonia.  Started on IV antibiotics and DuoNebs. 01/25: POD #4. Pt. with new concerns of not moving the left arm.  CT head and cervical spine obtained with no new concerns.  Follow-up MRI however demonstrated epidural hematoma extending from the cervical spine to the lower thoracic spine with acute compression and T2 signal.  Taken emergently to the OR for surgical decompression 01/26: Pt. transferred to the ICU s/p emergent cervical decompression and evacuation of epidural hematoma.  Remained intubated. PCCM consulted 08/03/23- s/p surgery overnight, alert to self but drowsy with mild AKI and metabolic acidosis.  Lethargy noted have dcd cefepime  and started doxycycline  instead.  08/04/23- patient more alert able to wiggle toes to verbal.  Remains critically ill.  08/05/23- patient appears to have hypoactive delerium with waxing and waning sensorium.  At times she asks for things other times she's poorly responsive.  Her oxygenation has improved with nasal canula 5L/min.  S/p heme/onc evaluation today 08/06/23- patient seen at bedside, she was able to whisper few words tome.  I discussed PO intake with speech pathologist and SLP with barium was ordered however patient unable to complete this study. She is still very weak physically barely able to wiggle toes.  H/h <7 s/p prbc.  Renal function is improved.  08/07/23- patient had worsening hypoxemia with CXR showing worsening complete left opacification. I discussed this with neurosurgery and they stated her cervical spine did not have any new hardware so intubation should not be complicated. I met with PA Edsel Goods at bedside  to review care plan. We discussed these findings with husband and proceeded with intubation and bronchoscopy.  The entire left lung appeared completely full of mucus impaction in both upper and lower  lobes during bronchoscopy.  CXR post bronchoscopy is in process.  08/08/23- patient is  extubated and is able to speak few words with family.  She moved her feet and right hand today.  I met with husband and son at bedside reviewed imaging and bloodwork. 08/09/23- patient had severe event overnight with hypoxemia , CXR This am with complete opacification of left lung again.  She went into respiratory distress overnight with severe hypoxemia.  She was emergently intubated overnight. WE notified familly and discussed events and imaging findings. She is s/p bronch with improved CXR post procedure.  08/10/23 - remains weak and minimally responsive, flaccid upper and lower extremities 08/11/23 - MRI performed overnight. Remains minimally responsive but does open eyes and track. PEG tube placed by surgery 08/12/23 - acute drop in hemoglobin. Mental status unchanged 08/13/23 - non-responsive. Remains weak, but moving upper extremity non-purposefully 08/14/23 - opens eyes, moves arms non-purposefully 08/15/23 - opens eyes, moving upper extremities, doesn't follow commands 08/16/23 - off neo, on vaso. Eyes open, moving upper and lower extremities non-purposefully   Objective   Blood pressure (!) 152/74, pulse 69, temperature 99.5 F (37.5 C), temperature source Esophageal, resp. rate (!) 21, height 5' 7 (1.702 m), weight 68.1 kg, SpO2 96%.    Vent Mode: PRVC FiO2 (%):  [30 %] 30 % Set Rate:  [16 bmp] 16 bmp Vt Set:  [450 mL] 450 mL PEEP:  [8 cmH20] 8 cmH20 Plateau Pressure:  [22 cmH20] 22 cmH20   Intake/Output Summary (Last 24 hours) at 08/16/2023 0756 Last data filed at 08/16/2023 0600 Gross per 24 hour  Intake 2436.33 ml  Output 2340 ml  Net 96.33 ml   Filed Weights   08/14/23 0427 08/15/23 0500 08/16/23 0500  Weight: 70.4 kg 67.7 kg 68.1 kg    Examination: Physical Exam Constitutional:      General: She is not in acute distress.    Appearance: She is ill-appearing.  Cardiovascular:     Rate and Rhythm: Normal rate and regular rhythm.     Pulses: Normal pulses.     Heart sounds: Normal  heart sounds.  Pulmonary:     Comments: Ventilated breath sounds bilaterally Abdominal:     Palpations: Abdomen is soft.  Neurological:     Mental Status: She is disoriented.     Motor: Weakness present.     Comments: Opens eyes, tracks, moving upper and lower extremities non-purposefully      Assessment & Plan:   #Acute Hypoxic Respiratory Failure #Aspiration Pneumonia #Toxic Metabolic Encephalopathy #Cervical Myelopathy s/p C2 dome laminectomy, C3-C6 laminoplasty; complicated by epidural hematoma s/p surgical decompression #Blood loss anemia #AKI on CKD stage III #Metabolic Acidosis #T2DM #Hypothyroidism #Acute Blood Loss Anemia  Neuro - C2 laminectomy, C3-C6 laminoplasty, complicated by epidural hematoma requiring surgical decompression. Course complicated by metabolic encephalopathy with concern for lower and upper extremity weakness. CT head and neck without acute bleed, but given persistent symptoms and history of epidural hematoma an MRI of the brain and spine was obtained with no restricted diffusion suggestive of damage rather than infarct. Able to pull volumes with SBT suggesting preservation of phrenic nerve, but given significant lower and upper extremity weakness she will be ventilator dependent in the short to medium term and will need a tracheostomy tube placed to aid in recovery. Continue to hold sedation, PRN fentanyl  for pain ordered.   CV - She is on vasopressin  for hypotension, phenylephrine  discontinued overnight. suspect secondary to spinal shock from spinal cord injury.  Was having atrial ectopy with nor-epinephrine . Pressor requirements stable. Goal MAP > 65  Pulm - intubated for respiratory failure secondary to encephalopthy complicated by aspiration pneumonia and white out of the left lung. s/p flexible bronchoscopy for therapeutic aspiration of secretions, with improved aeration of the left lung. On mechanical ventilation, and performed well with SBT but mental  status and significant upper and lower extremity weakness are barriers to extubation. She's also had increased secretions today. Continue hypertonic saline and aggressive pulmonary toilet. ENT consulted for consideration of tracheostomy tube placement, planned for early next week.  Renal - AKI on CKD, with non-anion gap metabolic acidosis. Appreciate input from nephrology - received sodium bicarbonate  infusion, now discontinued. Monitoring electrolytes and kidney function closely, avoiding nephrotoxins as able.  GI - PEG tube placed, and initiated on tube feeds. Continue PPI twice daily for now given recent GI bleed (likely ooze due to PEG placement).   Endo - on basal bolus regimen of insulin  with glargine, aspart and sliding scale. Also on levothyroxine  for hypothyroidism.  Hem/Onc - enoxaparin  for DVT prophylaxis, discontinued heparin  subQ for DVT prophylaxis given concern for GI bleed following PEG placement. Did have a drop in hemoglobin yesterday requiring PRBC's with appropriate response in H/H. Appreciate input from hematology, will administer FFP's prior to tracheostomy on Monday.  ID - aspiration pneumonia secondary to encephalopathy and recent surgery with inability to protect the airway. Respiratory cultures previously grew Candida Glabrata, which is likely a colonizer but with rising white count and previous TPN, I was concerned for fungemia. Cultures have remained no growth to date. TPN has since been discontinued and broad spectrum antibiotics initiated (Ceftazidime , Vancomycin  discontinued). Blood culture sent and remain negative. Respiratory cultures resent.  Best Practice (right click and Reselect all SmartList Selections daily)   Diet/type: tubefeeds DVT prophylaxis prophylactic heparin   Pressure ulcer(s): N/A GI prophylaxis: PPI Lines: Central line and yes and it is still needed Foley:  Yes, and it is still needed Code Status:  full code Last date of multidisciplinary goals  of care discussion [08/16/2023]  Labs   CBC: Recent Labs  Lab 08/12/23 1758 08/13/23 0440 08/13/23 1618 08/14/23 0411 08/14/23 1336 08/15/23 0429 08/16/23 0330  WBC 14.1* 11.6* 8.7 10.9*  --  8.0 7.2  NEUTROABS 10.8*  --  6.8  --   --   --   --   HGB 8.2* 7.6* 6.5* 10.0* 9.8* 9.9* 9.7*  HCT 23.6* 22.1* 18.9* 28.8* 28.7* 30.3* 30.2*  MCV 87.4 87.0 88.7 88.3  --  92.7 94.1  PLT 119* 107* 98* 104*  --  108* 107*    Basic Metabolic Panel: Recent Labs  Lab 08/11/23 0351 08/12/23 0405 08/13/23 0440 08/14/23 0411 08/15/23 0429 08/16/23 0330  NA 141 140 144 148* 148* 147*  K 4.3 4.6 3.9 3.1* 4.6 4.9  CL 115* 114* 111 111 112* 112*  CO2 14* 16* 22 26 26 24   GLUCOSE 271* 253* 166* 156* 274* 241*  BUN 95* 105* 97* 90* 83* 82*  CREATININE 1.92* 2.05* 1.77* 1.74* 1.68* 1.66*  CALCIUM  8.0* 8.4* 8.1* 7.8* 7.8* 8.0*  MG 2.3 2.3 2.2 2.3 2.2  --   PHOS 3.8 3.5 4.0 2.9 2.6 1.7*   GFR: Estimated Creatinine Clearance: 32.9 mL/min (A) (by C-G formula based on SCr of 1.66 mg/dL (H)). Recent Labs  Lab 08/13/23 1618 08/14/23 0411 08/15/23 0429 08/16/23 0330  WBC 8.7 10.9* 8.0 7.2    Liver Function Tests: Recent Labs  Lab 08/10/23 [phone]  08/12/23 0405 08/13/23 0440 08/14/23 0411 08/15/23 0429 08/16/23 0330  AST 25  --   --   --   --   --   ALT 14  --   --   --   --   --   ALKPHOS 59  --   --   --   --   --   BILITOT 0.6  --   --   --   --   --   PROT 6.0*  --   --   --   --   --   ALBUMIN  3.2* 2.6* 2.9* 2.8* 2.8* 2.9*   No results for input(s): LIPASE, AMYLASE in the last 168 hours. No results for input(s): AMMONIA in the last 168 hours.  ABG    Component Value Date/Time   PHART 7.23 (L) 08/09/2023 0637   PCO2ART 31 (L) 08/09/2023 0637   PO2ART 88 08/09/2023 0637   HCO3 13.0 (L) 08/09/2023 0637   ACIDBASEDEF 13.3 (H) 08/09/2023 0637   O2SAT 98.5 08/09/2023 0637     Coagulation Profile: Recent Labs  Lab 08/14/23 1336  INR 1.4*     Cardiac Enzymes: No  results for input(s): CKTOTAL, CKMB, CKMBINDEX, TROPONINI in the last 168 hours.  HbA1C: Hemoglobin A1C  Date/Time Value Ref Range Status  02/20/2013 04:02 AM 10.3 (H) 4.2 - 6.3 % Final    Comment:    The American Diabetes Association recommends that a primary goal of therapy should be <7% and that physicians should reevaluate the treatment regimen in patients with HbA1c values consistently >8%.    Hgb A1c MFr Bld  Date/Time Value Ref Range Status  04/13/2023 11:34 AM 5.9 (H) 4.8 - 5.6 % Final    Comment:    (NOTE) Pre diabetes:          5.7%-6.4%  Diabetes:              >6.4%  Glycemic control for   <7.0% adults with diabetes   08/11/2021 08:39 PM 7.2 (H) 4.8 - 5.6 % Final    Comment:    (NOTE) Pre diabetes:          5.7%-6.4%  Diabetes:              >6.4%  Glycemic control for   <7.0% adults with diabetes     CBG: Recent Labs  Lab 08/15/23 1136 08/15/23 1556 08/15/23 1937 08/15/23 2351 08/16/23 0408  GLUCAP 160* 198* 164* 262* 199*    Review of Systems:   Unable to obtain  Past Medical History:  She,  has a past medical history of Acute incomplete quadriplegia (HCC), Acute respiratory failure with hypoxia (HCC), AKI (acute kidney injury) (HCC), Arthritis, Blood dyscrasia, Central cord syndrome (HCC), Dental caries, Diabetes mellitus without complication (HCC), Diverticulosis, Dysphagia, Esophageal stricture, Facial cellulitis, GERD (gastroesophageal reflux disease), Hypothyroidism, Neuropathy, Odynophagia, Pelvic fracture (HCC), Renal insufficiency, Spinal cord compression, post-traumatic (HCC), Spinal cord injury, cervical region (HCC), and Stage 3a chronic kidney disease (CKD) (HCC).   Surgical History:   Past Surgical History:  Procedure Laterality Date   ABDOMINAL HYSTERECTOMY     BILATERAL SALPINGECTOMY Bilateral 03/16/2017   Procedure: BILATERAL SALPINGECTOMY;  Surgeon: Schermerhorn, Debby PARAS, MD;  Location: [facility] ORS;  Service: Gynecology;   Laterality: Bilateral;   BIOPSY  05/03/2023   Procedure: BIOPSY;  Surgeon: Leigh Elspeth SQUIBB, MD;  Location: [facility] ENDOSCOPY;  Service: Gastroenterology;;   COLONOSCOPY WITH PROPOFOL  N/A 08/18/2018   Procedure: COLONOSCOPY WITH PROPOFOL ;  Surgeon: Viktoria,  Lamar DASEN, MD;  Location: [facility] ENDOSCOPY;  Service: Endoscopy;  Laterality: N/A;   CYSTOCELE REPAIR N/A 03/16/2017   Procedure: ANTERIOR REPAIR (CYSTOCELE);  Surgeon: Schermerhorn, Debby PARAS, MD;  Location: [facility] ORS;  Service: Gynecology;  Laterality: N/A;   ESOPHAGOGASTRODUODENOSCOPY (EGD) WITH PROPOFOL  N/A 08/18/2018   Procedure: ESOPHAGOGASTRODUODENOSCOPY (EGD) WITH PROPOFOL ;  Surgeon: Viktoria Lamar DASEN, MD;  Location[facility] END[facility]COPY;  Service: Endoscopy;  Laterality: N/A;   ESOPHAGOGASTRODUODENOSCOPY (EGD) WITH PROPOFOL  Left 05/01/2023   Procedure: ESOPHAGOGASTRODUODENOSCOPY (EGD) WITH PROPOFOL ;  Surgeon: Leigh Elspeth SQUIBB, MD;  Location: [facility] ENDOSCOPY;  Service: Gastroenterology;  Laterality: Left;   ESOPHAGOGASTRODUODENOSCOPY (EGD) WITH PROPOFOL  N/A 05/03/2023   Procedure: ESOPHAGOGASTRODUODENOSCOPY (EGD) WITH PROPOFOL ;  Surgeon: Leigh Elspeth SQUIBB, MD;  Location: [facility] ENDOSCOPY;  Service: Gastroenterology;  Laterality: N/A;   GASTROSTOMY N/A 08/11/2023   Procedure: INSERTION OF GASTROSTOMY TUBE;  Surgeon: Jordis Laneta FALCON, MD;  Location: [facility] ORS;  Service: General;  Laterality: N/A;   POSTERIOR CERVICAL LAMINECTOMY N/A 07/28/2023   Procedure: C2 DOME LAMINECTOMY;  Surgeon: [name] ORN, MD;  Location: [facility] ORS;  Service: Neurosurgery;  Laterality: N/A;   POSTERIOR CERVICAL LAMINECTOMY N/A 08/01/2023   Procedure: POSTERIOR CERVICAL LAMINECTOMY;  Surgeon: [name] ORN, MD;  Location: [facility] ORS;  Service: Neurosurgery;  Laterality: N/A;   TEAR DUCT PROBING Right    TUBAL LIGATION     VAGINAL HYSTERECTOMY N/A 03/16/2017   Procedure: HYSTERECTOMY VAGINAL;  Surgeon: Schermerhorn, Debby PARAS, MD;  Location: [facility] ORS;  Service: Gynecology;   Laterality: N/A;   WISDOM TOOTH EXTRACTION       Social History:   reports that she has never smoked. She has never used smokeless tobacco. She reports that she does not drink alcohol and does not use drugs.   Family History:  Her family history includes Breast cancer in her maternal aunt; Diabetes in her father; Hypertension in her father and mother.   Allergies Allergies  Allergen Reactions   Amoxicillin -Pot Clavulanate Itching and Anaphylaxis   Ampicillin Rash     Home Medications  Prior to Admission medications   Medication Sig Start Date End Date Taking? Authorizing Provider  acetaminophen  (TYLENOL ) 650 MG CR tablet Take 650 mg by mouth every 8 (eight) hours as needed for pain.   Yes [provider]  chlorhexidine  (HIBICLENS ) 4 % external liquid Apply 15 mLs (1 Application total) topically as directed for 30 doses. Use as directed daily for 5 days every other week for 6 weeks. 07/28/23  Yes Ulis Bottcher, PA-C  cyanocobalamin  (VITAMIN B12) 1000 MCG tablet Take 1,000 mcg by mouth daily.   Yes [provider]  cyclobenzaprine  (FLEXERIL ) 10 MG tablet Take 1 tablet (10 mg total) by mouth at bedtime. 06/15/23  Yes Lovorn, Megan, MD  diclofenac  Sodium (VOLTAREN ) 1 % GEL Apply 2 g topically 4 (four) times daily. Patient taking differently: Apply 2 g topically 4 (four) times daily as needed (pain). 05/11/23  Yes Setzer, Nena PARAS, PA-C  fludrocortisone  (FLORINEF ) 0.1 MG tablet Take 2 tablets (0.2 mg total) by mouth daily. For orthostatic hypotension 06/15/23  Yes Lovorn, Megan, MD  gabapentin  (NEURONTIN ) 100 MG capsule Take 200 mg by mouth 2 (two) times daily.  08/16/15  Yes [provider]  glipiZIDE  (GLUCOTROL ) 5 MG tablet Take 10 mg by mouth daily before breakfast.   Yes [provider]  JARDIANCE  25 MG TABS tablet Take 25 mg by mouth daily.   Yes [provider]  lactase (LACTAID) 3000 units tablet Take 3,000 Units  by mouth daily before  breakfast.   Yes [provider]  levothyroxine  (SYNTHROID ) 88 MCG tablet Take 88 mcg by mouth daily before breakfast.   Yes [provider]  midodrine  (PROAMATINE ) 10 MG tablet Take 1.5 tablets (15 mg total) by mouth 3 (three) times daily with meals. For orthostatic hypotension 06/15/23  Yes Lovorn, Megan, MD  Multiple Vitamin (MULTIVITAMIN WITH MINERALS) TABS tablet Take 1 tablet by mouth daily. 04/22/23  Yes Alexander, Natalie, DO  mupirocin  ointment (BACTROBAN ) 2 % Place 1 Application into the nose 2 (two) times daily for 60 doses. Use as directed 2 times daily for 5 days every other week for 6 weeks. 07/28/23 08/27/23 Yes Ulis Bottcher, PA-C  pioglitazone  (ACTOS ) 30 MG tablet Take 30 mg by mouth daily.   Yes [provider]  Psyllium (METAMUCIL 3 IN 1 DAILY FIBER PO) Take 2 capsules by mouth daily.   Yes [provider]  rosuvastatin  (CRESTOR ) 5 MG tablet Take 5 mg by mouth at bedtime.   Yes [provider]  senna (SENOKOT) 8.6 MG TABS tablet Take 1 tablet (8.6 mg total) by mouth at bedtime. 05/11/23  Yes Setzer, Nena PARAS, PA-C     Critical care time: 36 minutes    Belva November, MD [facility] 08/16/2023 8:38 AM

## 2023-08-16 NOTE — Progress Notes (Signed)
 Neurosurgery visit note Patient seen and examined.  No significant events overnight patient has been seeing to have much more spontaneous lower extremity movement including per nursing report the right leg is spontaneously moving to the right and over the edge of the bed.  She has had multiple spontaneous flexion and extension movements of both the knee and ankle bilateral lower extremities.  She is also noted to have increasing upper extremity motions including close to reaching towards the tube in a near purposeful manner. On examination she is intubated currently I see multiple spontaneous flexion movements of her upper extremities which are more than withdraw and are per semipurposeful though not completely localizing She also has spontaneous bilateral lower extremity movements including flexion and extension at her ankles and knees I am able to stimulate some of the spontaneous movements with light touch to the soles of her feet and her reflexes are stable.  Incisions are well-healed and clean dry and intact per nursing  AP: Overall the patient seems to have more spontaneous movement of her lower extremities and upper extremities which is encouraging.  She is going for tracheostomy tomorrow.  Spoke with the family at bedside and they are encouraged with her progress.  We remain available and appreciate the excellent care of the ICU.  Belvie PARAS. Arissa Fagin MD Neurosurgery

## 2023-08-16 NOTE — Progress Notes (Signed)
 Arterial line removed as ordered with no complications.  Pressure applied to site and pressure dressing placed.  Patient tolerated well.

## 2023-08-16 NOTE — Progress Notes (Signed)
 [facility]  ROUNDING NOTE   Subjective:   No overnight events.  Tracheostomy being planned for Monday.  02/08 0701 - 02/09 0700 In: 2436.3 [I.V.:236.3; NG/GT:2000; IV Piggyback:200] Out: 2340 [Urine:2170; Stool:170] Lab Results  Component Value Date   CREATININE 1.66 (H) 08/16/2023   CREATININE 1.68 (H) 08/15/2023   CREATININE 1.74 (H) 08/14/2023   No family at bedside.  UOP  Na 147   Objective:  Vital signs in last 24 hours:  Temp:  [98.1 F (36.7 C)-101.1 F (38.4 C)] 99.7 F (37.6 C) (02/09 0800) Pulse Rate:  [51-74] 69 (02/09 0800) Resp:  [13-28] 22 (02/09 0800) SpO2:  [93 %-99 %] 96 % (02/09 0800) Arterial Line BP: (114-156)/(42-59) 135/50 (02/09 0800) FiO2 (%):  [30 %] 30 % (02/09 0800) Weight:  [68.1 kg] 68.1 kg (02/09 0500)  Weight change: 0.4 kg Filed Weights   08/14/23 0427 08/15/23 0500 08/16/23 0500  Weight: 70.4 kg 67.7 kg 68.1 kg    Intake/Output: I/O last 3 completed shifts: In: 3482.2 [I.V.:362.2; NG/GT:2820; IV Piggyback:300] Out: 3375 [Urine:3070; Stool:305]   Intake/Output this shift:  No intake/output data recorded.  Physical Exam: General: Critically ill  Head: Endotracheal tube in place  Neck: Tracheal midline  Lungs:  PRVC FiO2 30%  Heart: regular  Abdomen:  Soft, nontender, bowel sounds present  Extremities: no peripheral edema.  Neurologic: Intubated , sedated  Skin: No acute rash  Access: No hemodialysis access    Basic Metabolic Panel: Recent Labs  Lab 08/11/23 0351 08/12/23 0405 08/13/23 0440 08/14/23 0411 08/15/23 0429 08/16/23 0330  NA 141 140 144 148* 148* 147*  K 4.3 4.6 3.9 3.1* 4.6 4.9  CL 115* 114* 111 111 112* 112*  CO2 14* 16* 22 26 26 24   GLUCOSE 271* 253* 166* 156* 274* 241*  BUN 95* 105* 97* 90* 83* 82*  CREATININE 1.92* 2.05* 1.77* 1.74* 1.68* 1.66*  CALCIUM  8.0* 8.4* 8.1* 7.8* 7.8* 8.0*  MG 2.3 2.3 2.2 2.3 2.2  --   PHOS 3.8 3.5 4.0 2.9 2.6 1.7*    Liver Function  Tests: Recent Labs  Lab 08/10/23 0333 08/12/23 0405 08/13/23 0440 08/14/23 0411 08/15/23 0429 08/16/23 0330  AST 25  --   --   --   --   --   ALT 14  --   --   --   --   --   ALKPHOS 59  --   --   --   --   --   BILITOT 0.6  --   --   --   --   --   PROT 6.0*  --   --   --   --   --   ALBUMIN  3.2* 2.6* 2.9* 2.8* 2.8* 2.9*   No results for input(s): LIPASE, AMYLASE in the last 168 hours. No results for input(s): AMMONIA in the last 168 hours.  CBC: Recent Labs  Lab 08/12/23 1758 08/13/23 0440 08/13/23 1618 08/14/23 0411 08/14/23 1336 08/15/23 0429 08/16/23 0330  WBC 14.1* 11.6* 8.7 10.9*  --  8.0 7.2  NEUTROABS 10.8*  --  6.8  --   --   --   --   HGB 8.2* 7.6* 6.5* 10.0* 9.8* 9.9* 9.7*  HCT 23.6* 22.1* 18.9* 28.8* 28.7* 30.3* 30.2*  MCV 87.4 87.0 88.7 88.3  --  92.7 94.1  PLT 119* 107* 98* 104*  --  108* 107*    Cardiac Enzymes: No results for input(s): CKTOTAL, CKMB, CKMBINDEX, TROPONINI  in the last 168 hours.  BNP: Invalid input(s): POCBNP  CBG: Recent Labs  Lab 08/15/23 1556 08/15/23 1937 08/15/23 2351 08/16/23 0408 08/16/23 0807  GLUCAP 198* 164* 262* 199* 266*    Microbiology: Results for orders placed or performed during the hospital encounter of 07/28/23  MRSA Next Gen by PCR, Nasal     Status: Abnormal   Collection Time: 08/01/23  5:00 AM   Specimen: Nasal Mucosa; Nasal Swab  Result Value Ref Range Status   MRSA by PCR Next Gen DETECTED (A) NOT DETECTED Final    Comment: RESULT CALLED TO, READ BACK BY AND VERIFIED WITH: JOAN WILLIS @0923  08/01/23 MJU (NOTE) The GeneXpert MRSA Assay (FDA approved for NASAL specimens only), is one component of a comprehensive MRSA colonization surveillance program. It is not intended to diagnose MRSA infection nor to guide or monitor treatment for MRSA infections. Test performance is not FDA approved in patients less than 20 years old. Performed at [facility], [address]  [place], [place] [address]   Culture, BAL-quantitative w Gram Stain     Status: Abnormal   Collection Time: 08/07/23 11:57 AM   Specimen: Bronchoalveolar Lavage; Respiratory  Result Value Ref Range Status   Specimen Description   Final    BRONCHIAL ALVEOLAR LAVAGE Performed at [facility], [address], [place], [place] [address]    Special Requests   Final    NONE Performed at [facility], [address] [place], [place] [address]    Gram Stain   Final    MODERATE WBC PRESENT,BOTH PMN AND MONONUCLEAR FEW YEAST Performed at [facility], [address], [place], [place] [address]    Culture 30,000 COLONIES/mL CANDIDA GLABRATA (A)  Final   Report Status 08/10/2023 FINAL  Final  Culture, BAL-quantitative w Gram Stain     Status: Abnormal   Collection Time: 08/09/23 10:36 AM   Specimen: Bronchoalveolar Lavage; Respiratory  Result Value Ref Range Status   Specimen Description   Final    BRONCHIAL ALVEOLAR LAVAGE Performed at [facility], [address] [place], [place] [address]    Special Requests   Final    NONE Performed at [facility], [address] [place], [place] [address]    Gram Stain   Final    MODERATE WBC PRESENT,BOTH PMN AND MONONUCLEAR RARE GRAM POSITIVE COCCI IN PAIRS FEW YEAST Performed at [facility], [address], [place], [place] [address]    Culture 80,000 COLONIES/mL CANDIDA GLABRATA (A)  Final   Report Status 08/12/2023 FINAL  Final  Culture, blood (Routine X 2) w Reflex to ID Panel     Status: None   Collection Time: 08/10/23 11:45 AM   Specimen: BLOOD RIGHT ARM  Result Value Ref Range Status   Specimen Description BLOOD RIGHT ARM  Final   Special Requests   Final    BOTTLES DRAWN AEROBIC AND ANAEROBIC Blood Culture adequate volume   Culture   Final    NO GROWTH 5 DAYS Performed at [facility], [address] [place], [place] [address]    Report Status 08/15/2023 FINAL   Final  Culture, blood (Routine X 2) w Reflex to ID Panel     Status: None   Collection Time: 08/10/23 11:46 AM   Specimen: BLOOD LEFT HAND  Result Value Ref Range Status   Specimen Description BLOOD LEFT HAND  Final   Special Requests   Final  BOTTLES DRAWN AEROBIC AND ANAEROBIC Blood Culture adequate volume   Culture   Final    NO GROWTH 5 DAYS Performed at [facility], [address] [place] [place], [place] [address]    Report Status 08/15/2023 FINAL  Final  Culture, Respiratory w Gram Stain     Status: None (Preliminary result)   Collection Time: 08/15/23 11:12 AM   Specimen: Tracheal Aspirate; Respiratory  Result Value Ref Range Status   Specimen Description   Final    TRACHEAL ASPIRATE Performed at [facility], [address], [place], [place] [address]    Special Requests   Final    NONE Performed at [facility], [address] [place], [place] [address]    Gram Stain   Final    FEW WBC SEEN RARE YEAST Performed at [facility], [address] [place], [place] [address]    Culture PENDING  Incomplete   Report Status PENDING  Incomplete    Coagulation Studies: Recent Labs    08/14/23 1336  LABPROT 17.0*  INR 1.4*    Urinalysis: No results for input(s): COLORURINE, LABSPEC, PHURINE, GLUCOSEU, HGBUR, BILIRUBINUR, KETONESUR, PROTEINUR, UROBILINOGEN, NITRITE, LEUKOCYTESUR in the last 72 hours.  Invalid input(s): APPERANCEUR    Imaging: No results found.    Medications:    cefTAZidime  (FORTAZ )  IV 2 g (08/16/23 0302)   dextrose      feeding supplement (OSMOLITE 1.5 CAL) 60 mL/hr at 08/15/23 1900   vasopressin  0.03 Units/min (08/15/23 2117)    sodium chloride    Intravenous Once   sodium chloride    Intravenous Once   Chlorhexidine  Gluconate Cloth  6 each Topical Daily   feeding supplement (PROSource TF20)  60 mL Per Tube Daily   free water   200 mL Per Tube Q4H   insulin  aspart  0-15 Units  Subcutaneous Q4H   insulin  glargine-yfgn  5 Units Subcutaneous Daily   levothyroxine   88 mcg Per Tube QAC breakfast   mouth rinse  15 mL Mouth Rinse Q2H   pantoprazole  (PROTONIX ) IV  40 mg Intravenous Q12H   potassium & sodium phosphates   2 packet Per Tube Once   sodium chloride  flush  10-40 mL Intracatheter Q12H   sodium chloride  flush  3-10 mL Intravenous Q12H   sodium chloride  flush  3-10 mL Intravenous Q12H   acetaminophen , bisacodyl , docusate, fentaNYL  (SUBLIMAZE ) injection, fentaNYL  (SUBLIMAZE ) injection, menthol -cetylpyridinium **OR** [DISCONTINUED] phenol, mouth rinse, polyethylene glycol, sodium chloride  flush, sodium chloride  flush, sodium chloride  flush  Assessment/ Plan:  68 y.o. female with a PMHx of acute incomplete quadriplegia, history of easy bleeding, central cord syndrome, diabetes mellitus type 2, diverticulosis, GERD, hypothyroidism, peripheral neuropathy, who was admitted to [facility] on 07/28/2023 for C2 dome laminectomy, C3-6 laminoplasty with postoperative bleeding and epidural hematoma causing spinal cord compression s/p evacuation 08/01/2023 who we are asked to see now for acute kidney injury.   1.  Acute kidney injury/chronic kidney disease stage II baseline creatinine 0.75.  Suspect acute kidney injury now related to relative hypotension. 02/08 0701 - 02/09 0700 In: 2436.3 [I.V.:236.3; NG/GT:2000; IV Piggyback:200] Out: 2340 [Urine:2170; Stool:170] Lab Results  Component Value Date   CREATININE 1.66 (H) 08/16/2023   CREATININE 1.68 (H) 08/15/2023   CREATININE 1.74 (H) 08/14/2023  Patient continues to have good urine output.  Creatinine has stabilized at 1.7.  No immediate need for dialysis.  Continue to monitor renal parameters.  2. Hypernatremia: with calculated free water  deficit of 0.7L.  - start D5W infusion for 24  hours to optimize volume status prior to surgery.   2.  Anemia of chronic kidney disease.   status post transfusion.  Continue to monitor CBC  closely. Hemoglobin stable at 9.7. Remains thrombocytopenic.   3.  Acute respiratory failure. patient maintained on vent support and ENT planning for tracheostomy on Monday.   LOS: 19 Carmin Alvidrez 2/9/20259:56 AM

## 2023-08-16 NOTE — Plan of Care (Signed)
 Continuing with plan of care.

## 2023-08-16 NOTE — Plan of Care (Signed)

## 2023-08-17 ENCOUNTER — Inpatient Hospital Stay: Payer: Medicare Other | Admitting: Anesthesiology

## 2023-08-17 ENCOUNTER — Encounter
Admission: RE | Disposition: A | Discharge status: Nursing Home (Includes ICF) | Payer: Self-pay | Source: Ambulatory Visit | Attending: Neurosurgery

## 2023-08-17 ENCOUNTER — Other Ambulatory Visit: Payer: Self-pay

## 2023-08-17 DIAGNOSIS — Z7189 Other specified counseling: Secondary | ICD-10-CM | POA: Diagnosis not present

## 2023-08-17 DIAGNOSIS — M4804 Spinal stenosis, thoracic region: Secondary | ICD-10-CM | POA: Diagnosis not present

## 2023-08-17 DIAGNOSIS — N1832 Chronic kidney disease, stage 3b: Secondary | ICD-10-CM

## 2023-08-17 DIAGNOSIS — J690 Pneumonitis due to inhalation of food and vomit: Secondary | ICD-10-CM | POA: Diagnosis not present

## 2023-08-17 DIAGNOSIS — J9601 Acute respiratory failure with hypoxia: Secondary | ICD-10-CM | POA: Diagnosis not present

## 2023-08-17 DIAGNOSIS — G959 Disease of spinal cord, unspecified: Secondary | ICD-10-CM | POA: Diagnosis not present

## 2023-08-17 HISTORY — PX: TRACHEOSTOMY TUBE PLACEMENT: SHX814

## 2023-08-17 LAB — CBC
HCT: 27.9 % — ABNORMAL LOW (ref 36.0–46.0)
HCT: 27 % — ABNORMAL LOW (ref 36.0–46.0)
Hemoglobin: 8.8 g/dL — ABNORMAL LOW (ref 12.0–15.0)
Hemoglobin: 8.5 g/dL — ABNORMAL LOW (ref 12.0–15.0)
MCH: 30.1 pg (ref 26.0–34.0)
MCH: 30.7 pg (ref 26.0–34.0)
MCHC: 31.5 g/dL (ref 30.0–36.0)
MCHC: 31.5 g/dL (ref 30.0–36.0)
MCV: 95.5 fL (ref 80.0–100.0)
MCV: 97.5 fL (ref 80.0–100.0)
Platelets: 94 10*3/uL — ABNORMAL LOW (ref 150–400)
Platelets: 100 10*3/uL — ABNORMAL LOW (ref 150–400)
RBC: 2.92 MIL/uL — ABNORMAL LOW (ref 3.87–5.11)
RBC: 2.77 MIL/uL — ABNORMAL LOW (ref 3.87–5.11)
RDW: 16.3 % — ABNORMAL HIGH (ref 11.5–15.5)
RDW: 16.3 % — ABNORMAL HIGH (ref 11.5–15.5)
WBC: 5.6 10*3/uL (ref 4.0–10.5)
WBC: 5.7 10*3/uL (ref 4.0–10.5)
nRBC: 0 % (ref 0.0–0.2)
nRBC: 0 % (ref 0.0–0.2)

## 2023-08-17 LAB — GLUCOSE, CAPILLARY
Glucose-Capillary: 191 mg/dL — ABNORMAL HIGH (ref 70–99)
Glucose-Capillary: 188 mg/dL — ABNORMAL HIGH (ref 70–99)
Glucose-Capillary: 184 mg/dL — ABNORMAL HIGH (ref 70–99)
Glucose-Capillary: 203 mg/dL — ABNORMAL HIGH (ref 70–99)
Glucose-Capillary: 248 mg/dL — ABNORMAL HIGH (ref 70–99)
Glucose-Capillary: 272 mg/dL — ABNORMAL HIGH (ref 70–99)

## 2023-08-17 LAB — RENAL FUNCTION PANEL
Albumin: 2.6 g/dL — ABNORMAL LOW (ref 3.5–5.0)
Anion gap: 9 (ref 5–15)
BUN: 73 mg/dL — ABNORMAL HIGH (ref 8–23)
CO2: 25 mmol/L (ref 22–32)
Calcium: 7.6 mg/dL — ABNORMAL LOW (ref 8.9–10.3)
Chloride: 107 mmol/L (ref 98–111)
Creatinine, Ser: 1.55 mg/dL — ABNORMAL HIGH (ref 0.44–1.00)
GFR, Estimated: 37 mL/min — ABNORMAL LOW (ref >60)
Glucose, Bld: 325 mg/dL — ABNORMAL HIGH (ref 70–99)
Phosphorus: 2.5 mg/dL (ref 2.5–4.6)
Potassium: 4.8 mmol/L (ref 3.5–5.1)
Sodium: 141 mmol/L (ref 135–145)

## 2023-08-17 SURGERY — CREATION, TRACHEOSTOMY
Anesthesia: General

## 2023-08-17 MED ORDER — ROCURONIUM BROMIDE 10 MG/ML (PF) SYRINGE
PREFILLED_SYRINGE | INTRAVENOUS | Status: AC
  Filled 2023-08-17: qty 10

## 2023-08-17 MED ORDER — FENTANYL CITRATE (PF) 100 MCG/2ML IJ SOLN
INTRAMUSCULAR | Status: DC | PRN
  Administered 2023-08-17 (×2): 50 ug via INTRAVENOUS

## 2023-08-17 MED ORDER — LIDOCAINE-EPINEPHRINE 1 %-1:100000 IJ SOLN
INTRAMUSCULAR | Status: AC
  Filled 2023-08-17: qty 1

## 2023-08-17 MED ORDER — ROCURONIUM BROMIDE 100 MG/10ML IV SOLN
INTRAVENOUS | Status: DC | PRN
  Administered 2023-08-17: 40 mg via INTRAVENOUS

## 2023-08-17 MED ORDER — FLUDROCORTISONE ACETATE 0.1 MG PO TABS
0.2000 mg | ORAL_TABLET | Freq: Every day | ORAL | Status: DC
  Administered 2023-08-17 – 2023-08-20 (×4): 0.2 mg via ORAL
  Filled 2023-08-17 (×4): qty 2

## 2023-08-17 MED ORDER — SODIUM CHLORIDE 0.9% IV SOLUTION: Freq: Once | INTRAVENOUS | Status: DC

## 2023-08-17 MED ORDER — FENTANYL CITRATE (PF) 100 MCG/2ML IJ SOLN
INTRAMUSCULAR | Status: AC
  Filled 2023-08-17: qty 2

## 2023-08-17 MED ORDER — LIDOCAINE-EPINEPHRINE 1 %-1:100000 IJ SOLN
INTRAMUSCULAR | Status: DC | PRN
  Administered 2023-08-17: 7 mL

## 2023-08-17 MED ORDER — MIDAZOLAM HCL 2 MG/2ML IJ SOLN
INTRAMUSCULAR | Status: DC | PRN
  Administered 2023-08-17: 2 mg via INTRAVENOUS

## 2023-08-17 MED ORDER — MIDAZOLAM HCL 2 MG/2ML IJ SOLN
INTRAMUSCULAR | Status: AC
  Filled 2023-08-17: qty 2

## 2023-08-17 MED ORDER — FREE WATER
100.0000 mL | Freq: Four times a day (QID) | Status: DC
  Administered 2023-08-17 – 2023-08-20 (×11): 100 mL

## 2023-08-17 MED ORDER — MIDODRINE HCL 5 MG PO TABS: 5.0000 mg | ORAL_TABLET | Freq: Three times a day (TID) | ORAL | Status: DC

## 2023-08-17 MED ORDER — MIDODRINE HCL 5 MG PO TABS
5.0000 mg | ORAL_TABLET | Freq: Three times a day (TID) | ORAL | Status: DC
  Administered 2023-08-18: 5 mg
  Filled 2023-08-17: qty 1

## 2023-08-17 MED ORDER — MIDODRINE HCL 5 MG PO TABS
5.0000 mg | ORAL_TABLET | Freq: Once | ORAL | Status: AC
  Administered 2023-08-17: 5 mg
  Filled 2023-08-17: qty 1

## 2023-08-17 MED ORDER — 0.9 % SODIUM CHLORIDE (POUR BTL) OPTIME
TOPICAL | Status: DC | PRN
  Administered 2023-08-17: 500 mL

## 2023-08-17 MED ORDER — SODIUM CHLORIDE 0.9 % IV SOLN: INTRAVENOUS | Status: DC | PRN

## 2023-08-17 MED ORDER — PHENYLEPHRINE 80 MCG/ML (10ML) SYRINGE FOR IV PUSH (FOR BLOOD PRESSURE SUPPORT)
PREFILLED_SYRINGE | INTRAVENOUS | Status: AC
  Filled 2023-08-17: qty 10

## 2023-08-17 MED ORDER — PHENYLEPHRINE HCL-NACL 20-0.9 MG/250ML-% IV SOLN
INTRAVENOUS | Status: AC
  Filled 2023-08-17: qty 250

## 2023-08-17 MED ORDER — JUVEN PO PACK
1.0000 | PACK | Freq: Two times a day (BID) | ORAL | Status: DC
  Administered 2023-08-18 – 2023-08-21 (×7): 1

## 2023-08-17 MED ORDER — PHENYLEPHRINE 80 MCG/ML (10ML) SYRINGE FOR IV PUSH (FOR BLOOD PRESSURE SUPPORT)
PREFILLED_SYRINGE | INTRAVENOUS | Status: DC | PRN
  Administered 2023-08-17 (×2): 80 ug via INTRAVENOUS

## 2023-08-17 SURGICAL SUPPLY — 31 items
ATTRACTOMAT 16X20 MAGNETIC DRP (DRAPES) ×1 IMPLANT
BLADE SURG 15 STRL LF DISP TIS (BLADE) ×1 IMPLANT
BLADE SURG SZ11 CARB STEEL (BLADE) ×1 IMPLANT
CORD BIP STRL DISP 12FT (MISCELLANEOUS) ×1 IMPLANT
ELECT CAUTERY BLADE TIP 2.5 (TIP) ×1 IMPLANT
ELECT REM PT RETURN 9FT ADLT (ELECTROSURGICAL) ×1 IMPLANT
ELECTRODE CAUTERY BLDE TIP 2.5 (TIP) ×1 IMPLANT
ELECTRODE REM PT RTRN 9FT ADLT (ELECTROSURGICAL) ×1 IMPLANT
FORCEPS JEWEL BIP 4-3/4 STR (INSTRUMENTS) ×1 IMPLANT
GAUZE 4X4 16PLY ~~LOC~~+RFID DBL (SPONGE) ×1 IMPLANT
GLOVE BIO SURGEON STRL SZ7.5 (GLOVE) ×2 IMPLANT
GOWN STRL REUS W/ TWL LRG LVL3 (GOWN DISPOSABLE) ×2 IMPLANT
HEMOSTAT SURGICEL 2X3 (HEMOSTASIS) IMPLANT
HLDR TRACH TUBE NECKBAND 18 (MISCELLANEOUS) ×1 IMPLANT
KIT TURNOVER KIT A (KITS) ×1 IMPLANT
LABEL OR SOLS (LABEL) ×1 IMPLANT
MANIFOLD NEPTUNE II (INSTRUMENTS) ×1 IMPLANT
NS IRRIG 500ML POUR BTL (IV SOLUTION) ×1 IMPLANT
PACK HEAD/NECK (MISCELLANEOUS) ×1 IMPLANT
SHEARS HARMONIC 9CM CVD (BLADE) ×1 IMPLANT
SPONGE DRAIN TRACH 4X4 STRL 2S (GAUZE/BANDAGES/DRESSINGS) ×1 IMPLANT
SPONGE KITTNER 5P (MISCELLANEOUS) ×1 IMPLANT
SUCTION TUBE FRAZIER 10FR DISP (SUCTIONS) ×1 IMPLANT
SUT ETHILON 2 0 FS 18 (SUTURE) ×1 IMPLANT
SUT SILK 2-0 18XBRD TIE 12 (SUTURE) IMPLANT
SUT VICRYL+ 4-0 18IN PS-4 (SUTURE) ×1 IMPLANT
SYR 10ML LL (SYRINGE) ×1 IMPLANT
TRAP FLUID SMOKE EVACUATOR (MISCELLANEOUS) ×1 IMPLANT
TUBE TRACH 6.0 CUFF FLEX (MISCELLANEOUS) IMPLANT
TUBE TRACH FLEX 8.5 CUFF (MISCELLANEOUS) IMPLANT
WATER STERILE IRR 500ML POUR (IV SOLUTION) ×1 IMPLANT

## 2023-08-17 NOTE — Op Note (Signed)
..  08/17/2023 8:20 AM  [name],  [name] [id number]  Pre-Op Dx: Respiratory failure, need for long term ventilatory support  Post-Op Dx:  same  Proc:  Tracheostomy  Surg:  [name]  Assist:  [name]  Anes:  GOT  EBL:  <33ml  Comp:  none  Findings:  Previous thyroidectomy and scar tissue present with strap muscles adherent to anterior trachea.  Tracheostomy placed between tracheal rings 2 and 3.  Size 6 shiley placed.  Concern for coagulopathy and cryoprecipitate/platelets given.  No significant bleeding noted during procedure.    Procedure:  The patient was brought from the intensive care unit to the operating room and transferred to an operating table.  Anesthesia was administered per indwelling orotracheal tube.   Neck extension was achieved as possible anda shoulder rolke was placed.  The lower neck was palpated with the findings as described above.  1% Xylocaine  with 1:100,000 epinephrine , 7 cc's, was infiltrated into the surgical field for intraoperative hemostasis.  Several minutes were allowed for this to take effect. The patient was prepped in a sterile fashion with a surgical prep from the chin down to the upper chest.  Sterile draping was accomplished in the standard fashion.  A  4 cm horizontal incision was made sharply a finger's breadth above the sternal notch, and extended through skin and subcutaneous fat.  Using cautery, the superficial layer of the deep cervical fascia was lysed.  Additional dissection revealed the strap muscles.  The midline raphe was divided in two layers and the muscles retracted laterally.  The pretracheal plane was visualized.  This was entered bluntly.  The thyroid isthmus was isolated and divided with the Harmonic scalpel.  The thyroid gland was retracted to either side.  The anterior face of the trachea was cleared.  In the  2nd-3rd interspace, a transverse incision was made between cartilage rings into the tracheal lumen.  A 6 mm wide  inferiorly based flap was generated and secured to the lower wound with a 4-0 chromic suture.   A previously tested  # 6 Shiley cuffed tracheostomy tube was brought into the field.  With the endotracheal tube under direct visualization through the tracheostomy, it was gently backed up.  The tracheostomy tube was inserted into the tracheal lumen.  Hemostasis was observed. The cuff was inflated and observed to be intact and containing pressure. The inner cannula was placed and ventilation assumed per tracheostomy tube.  Good chest wall motion was observed, and CO2 was documented per anesthesia.  The trach tube was secured in the standard fashion with trach ties. A 2-0 Nylon suture was used to secure the trach tube to the skin on both sides.  Hemostasis was observed again.  When satisfactory ventilation was assured, the orotracheal tube was removed.  At this point the procedure was completed.  The patient was returned to anesthesia, awakened as possible, and transferred back to the intensive care unit in stable condition.  Comment: 68 y.o. female with prolonged ventilation was the indication for today's procedure.  Anticipate a routine postoperative recovery including standard tracheal hygiene.  The sutures should be removed in 5 days.  When the patient no longer requires ventilator or pressure support, the cuff should be deflated.  Changing to an uncuffed tube and downsizing will be according to the clinical condition of the patient.   [name]  8:20 AM 08/17/2023

## 2023-08-17 NOTE — Plan of Care (Signed)

## 2023-08-17 NOTE — Progress Notes (Signed)
 Patient received a trach first thing this morning. Awhile after returning to her room she was awake and following commands. The patients husband asked me to come into the room because the patient was mouthing something.  She mouthed several times "I want to die." I made the husband aware of what she was saying. I also passed the information along to Janey Meek, NP, and Meribeth Standard NP. The patient mouthed this several times.  I went in to reposition the patient at this time and she again mouthed several times that "I want to die." Bernett Brill, [employer] student nurse, was also at bedside and witnessed these events.

## 2023-08-17 NOTE — Progress Notes (Signed)
 [facility]  ROUNDING NOTE   Subjective:   Patient remains critically ill in ICU Sedated from tracheostomy procedure  02/09 0701 - 02/10 0700 In: 1748.3 [I.V.:1024.4; Blood:310; NG/GT:314; IV Piggyback:99.9] Out: 2425 [Urine:2290; Stool:135] Lab Results  Component Value Date   CREATININE 1.55 (H) 08/17/2023   CREATININE 1.66 (H) 08/16/2023   CREATININE 1.68 (H) 08/15/2023      Objective:  Vital signs in last 24 hours:  Temp:  [97.7 F (36.5 C)-99.9 F (37.7 C)] 97.7 F (36.5 C) (02/10 0700) Pulse Rate:  [49-83] 57 (02/10 1100) Resp:  [11-25] 23 (02/10 1100) BP: (90-137)/(49-92) 119/56 (02/10 1100) SpO2:  [91 %-97 %] 96 % (02/10 1100) FiO2 (%):  [30 %] 30 % (02/10 0842) Weight:  [67.4 kg] 67.4 kg (02/10 0432)  Weight change: -0.7 kg Filed Weights   08/15/23 0500 08/16/23 0500 08/17/23 0432  Weight: 67.7 kg 68.1 kg 67.4 kg    Intake/Output: I/O last 3 completed shifts: In: 3727.6 [I.V.:1143.7; Blood:310; NG/GT:2074; IV Piggyback:199.9] Out: 4050 [Urine:3880; Stool:170]   Intake/Output this shift:  Total I/O In: 511 [I.V.:200; Blood:311] Out: 5 [Blood:5]  Physical Exam: General: Critically ill  Head: Endotracheal tube in place  Neck: Trach placed on 08/17/23  Lungs:  PRVC FiO2 40%  Heart: regular  Abdomen:  Soft, nontender, bowel sounds present  Extremities: no peripheral edema.  Neurologic: Intubated , sedated  Skin: No acute rash  Access: No hemodialysis access    Basic Metabolic Panel: Recent Labs  Lab 08/11/23 0351 08/12/23 0405 08/13/23 0440 08/14/23 0411 08/15/23 0429 08/16/23 0330 08/17/23 0445  NA 141 140 144 148* 148* 147* 141  K 4.3 4.6 3.9 3.1* 4.6 4.9 4.8  CL 115* 114* 111 111 112* 112* 107  CO2 14* 16* 22 26 26 24 25   GLUCOSE 271* 253* 166* 156* 274* 241* 325*  BUN 95* 105* 97* 90* 83* 82* 73*  CREATININE 1.92* 2.05* 1.77* 1.74* 1.68* 1.66* 1.55*  CALCIUM  8.0* 8.4* 8.1* 7.8* 7.8* 8.0* 7.6*  MG 2.3 2.3 2.2 2.3 2.2   --   --   PHOS 3.8 3.5 4.0 2.9 2.6 1.7* 2.5    Liver Function Tests: Recent Labs  Lab 08/13/23 0440 08/14/23 0411 08/15/23 0429 08/16/23 0330 08/17/23 0445  ALBUMIN  2.9* 2.8* 2.8* 2.9* 2.6*   No results for input(s): "LIPASE", "AMYLASE" in the last 168 hours. No results for input(s): "AMMONIA" in the last 168 hours.  CBC: Recent Labs  Lab 08/12/23 1758 08/13/23 0440 08/13/23 1618 08/14/23 0411 08/14/23 1336 08/15/23 0429 08/16/23 0330 08/17/23 0445  WBC 14.1*   < > 8.7 10.9*  --  8.0 7.2 5.6  NEUTROABS 10.8*  --  6.8  --   --   --   --   --   HGB 8.2*   < > 6.5* 10.0* 9.8* 9.9* 9.7* 8.8*  HCT 23.6*   < > 18.9* 28.8* 28.7* 30.3* 30.2* 27.9*  MCV 87.4   < > 88.7 88.3  --  92.7 94.1 95.5  PLT 119*   < > 98* 104*  --  108* 107* 94*   < > = values in this interval not displayed.    Cardiac Enzymes: No results for input(s): "CKTOTAL", "CKMB", "CKMBINDEX", "TROPONINI" in the last 168 hours.  BNP: Invalid input(s): "POCBNP"  CBG: Recent Labs  Lab 08/16/23 1951 08/16/23 2348 08/17/23 0441 08/17/23 0724 08/17/23 1128  GLUCAP 296* 262* 191* 188* 184*    Microbiology: Results for orders placed or performed during  the hospital encounter of 07/28/23  MRSA Next Gen by PCR, Nasal     Status: Abnormal   Collection Time: 08/01/23  5:00 AM   Specimen: Nasal Mucosa; Nasal Swab  Result Value Ref Range Status   MRSA by PCR Next Gen DETECTED (A) NOT DETECTED Final    Comment: RESULT CALLED TO, READ BACK BY AND VERIFIED WITH: JOAN WILLIS @0923  08/01/23 MJU (NOTE) The GeneXpert MRSA Assay (FDA approved for NASAL specimens only), is one component of a comprehensive MRSA colonization surveillance program. It is not intended to diagnose MRSA infection nor to guide or monitor treatment for MRSA infections. Test performance is not FDA approved in patients less than 25 years old. Performed at [facility], [address] [place], [place] [address]   Culture,  BAL-quantitative w Gram Stain     Status: Abnormal   Collection Time: 08/07/23 11:57 AM   Specimen: Bronchoalveolar Lavage; Respiratory  Result Value Ref Range Status   Specimen Description   Final    BRONCHIAL ALVEOLAR LAVAGE Performed at [facility], [address] [place], [place] [address]    Special Requests   Final    NONE Performed at [facility], [address] [place], [place] [address]    Gram Stain   Final    MODERATE WBC PRESENT,BOTH PMN AND MONONUCLEAR FEW YEAST Performed at [facility], [address], [place], [place] [address]    Culture 30,000 COLONIES/mL CANDIDA GLABRATA (A)  Final   Report Status 08/10/2023 FINAL  Final  Culture, BAL-quantitative w Gram Stain     Status: Abnormal   Collection Time: 08/09/23 10:36 AM   Specimen: Bronchoalveolar Lavage; Respiratory  Result Value Ref Range Status   Specimen Description   Final    BRONCHIAL ALVEOLAR LAVAGE Performed at [facility], [address] [place], [place] [address]    Special Requests   Final    NONE Performed at [facility], [address] [place], [place] [address]    Gram Stain   Final    MODERATE WBC PRESENT,BOTH PMN AND MONONUCLEAR RARE GRAM POSITIVE COCCI IN PAIRS FEW YEAST Performed at [facility], [address], [place], [place] [address]    Culture 80,000 COLONIES/mL CANDIDA GLABRATA (A)  Final   Report Status 08/12/2023 FINAL  Final  Culture, blood (Routine X 2) w Reflex to ID Panel     Status: None   Collection Time: 08/10/23 11:45 AM   Specimen: BLOOD RIGHT ARM  Result Value Ref Range Status   Specimen Description BLOOD RIGHT ARM  Final   Special Requests   Final    BOTTLES DRAWN AEROBIC AND ANAEROBIC Blood Culture adequate volume   Culture   Final    NO GROWTH 5 DAYS Performed at [facility], [address] [place], [place] [address]    Report Status 08/15/2023 FINAL  Final  Culture, blood (Routine X 2) w  Reflex to ID Panel     Status: None   Collection Time: 08/10/23 11:46 AM   Specimen: BLOOD LEFT HAND  Result Value Ref Range Status   Specimen Description BLOOD LEFT HAND  Final   Special Requests   Final    BOTTLES DRAWN AEROBIC AND ANAEROBIC Blood Culture adequate volume   Culture   Final    NO GROWTH 5 DAYS Performed at [facility], [address] [place], [place] [address]    Report Status 08/15/2023 FINAL  Final  Culture, Respiratory w Gram Stain     Status: None (Preliminary result)   Collection Time: 08/15/23 11:12 AM   Specimen: Tracheal Aspirate; Respiratory  Result Value Ref Range Status   Specimen Description   Final    TRACHEAL ASPIRATE Performed at [facility], [address] [place], [place] [address]    Special Requests   Final    NONE Performed at [facility], [address] [place], [place] [address]    Gram Stain FEW WBC SEEN RARE YEAST   Final   Culture   Final    CULTURE REINCUBATED FOR BETTER GROWTH Performed at [facility], [address], [place], [place] [address]    Report Status PENDING  Incomplete    Coagulation Studies: Recent Labs    08/14/23 1336  LABPROT 17.0*  INR 1.4*    Urinalysis: No results for input(s): "COLORURINE", "LABSPEC", "PHURINE", "GLUCOSEU", "HGBUR", "BILIRUBINUR", "KETONESUR", "PROTEINUR", "UROBILINOGEN", "NITRITE", "LEUKOCYTESUR" in the last 72 hours.  Invalid input(s): "APPERANCEUR"    Imaging: No results found.    Medications:    feeding supplement (OSMOLITE 1.5 CAL) Stopped (08/17/23 0014)    sodium chloride    Intravenous Once   Chlorhexidine  Gluconate Cloth  6 each Topical Daily   feeding supplement (PROSource TF20)  60 mL Per Tube Daily   free water   200 mL Per Tube Q4H   insulin  aspart  0-15 Units Subcutaneous Q4H   insulin  glargine-yfgn  5 Units Subcutaneous Daily   levothyroxine   88 mcg Per Tube QAC breakfast   mouth rinse  15 mL Mouth Rinse Q2H    pantoprazole  (PROTONIX ) IV  40 mg Intravenous Q12H   sodium chloride  flush  10-40 mL Intracatheter Q12H   sodium chloride  flush  3-10 mL Intravenous Q12H   sodium chloride  flush  3-10 mL Intravenous Q12H   acetaminophen , bisacodyl , docusate, fentaNYL  (SUBLIMAZE ) injection, menthol -cetylpyridinium **OR** [DISCONTINUED] phenol, mouth rinse, polyethylene glycol, sodium chloride  flush, sodium chloride  flush, sodium chloride  flush  Assessment/ Plan:  68 y.o. female with a PMHx of acute incomplete quadriplegia, history of easy bleeding, central cord syndrome, diabetes mellitus type 2, diverticulosis, GERD, hypothyroidism, peripheral neuropathy, who was admitted to [facility] on 07/28/2023 for C2 dome laminectomy, C3-6 laminoplasty with postoperative bleeding and epidural hematoma causing spinal cord compression s/p evacuation 08/01/2023 who we are asked to see now for acute kidney injury.   1.  Acute kidney injury/chronic kidney disease stage II baseline creatinine 0.75.  Suspect acute kidney injury now related to relative hypotension. 02/09 0701 - 02/10 0700 In: 1748.3 [I.V.:1024.4; Blood:310; NG/GT:314; IV Piggyback:99.9] Out: 2425 [Urine:2290; Stool:135] Lab Results  Component Value Date   CREATININE 1.55 (H) 08/17/2023   CREATININE 1.66 (H) 08/16/2023   CREATININE 1.68 (H) 08/15/2023  Renal function continues to slowly improved. Good urine output. No acute need for dialysis.  2. Hypernatremia: with calculated free water  deficit of 0.7L.  - Sodium 141  - Given D5 infusion this weekend  2.  Anemia of chronic kidney disease.   status post transfusion.  Continue to monitor CBC closely. Hemoglobin 8.8.  3.  Acute respiratory failure. Trach placed on 08/17/23   LOS: 20 Zafar Debrosse 2/10/202511:54 AM

## 2023-08-17 NOTE — Progress Notes (Signed)
 Neurosurgery Progress Note  History: [name] is here for C2 dome laminectomy, C3-6 laminoplasty and now s/p evacuation hematoma  POD19/16: pt had trach placed this morning   POD16/13: Pt reportedly moving upper extremities per CC this morning  POD15/12: Pt more interactive per family at bedside and CC team POD14/11: PEG placed yesterday. Pt with worsening anemia. Neurologically unchanged  POD13/10: MRI this morning does not appear to be consistent with spinal cord infarct.  Patient's neurologic exam is unchanged. POD12/9: Pt had neuro change early this morning prompting CT head and C spine.  POD 11/7: Was extubated this morning prior to evaluation.  Was able to track with eyes but was quite somnolent still.  Was reacting to painful stimuli in the bilateral lower extremities.  Left upper extremity was reacting to painful stimuli as well. POD 10/6: Pt experiencing increased work of breathing this morning and worsening findings on chest xray resulting in reintubation this morning. She is currently undergoing bronchoscopy by Dr. Aleskerov  POD 9/5: Pt more alert this morning asking what day it is  POD 8/4: Very drowsy but arouses this morning to voice.  POD 7/3: Pt is very drowsy, but able to follow some commands  POD6/2: Pt is drowsy this morning asking "what do you want" when awaken  Interval POD5: [name] had a decline yesterday with her left upper extremity and then bilateral lower extremities.  CT scan was unremarkable MRI did show a large epidural hematoma.  She was taken emergently to the OR by Dr. Felipe Horton for decompression.  She has been monitored in the ICU overnight. She is still intubated and on sedation. She is on pressors to maintain [facility]   Hospital Course POD4: [name] is more awake today. She states she isnt having a lot of pain but she is thirsty.  POD3:Patient more alert this AM. However; hypoxia, tachycardia, SOB, low energy.  POD2: Very lethargic and difficult to  arouse, states in quite a bit of pain. Medicine had been held due to sleepiness.    Physical Exam: Vitals:   08/17/23 0530 08/17/23 0600  BP: (!) 131/54 (!) 123/54  Pulse: (!) 56 (!) 50  Resp: 16 16  Temp: 97.7 F (36.5 C) 97.7 F (36.5 C)  SpO2: 96% 96%    Trach in place  Follows commands x 4.  Data:  Output by Drain (mL) 08/15/23 0701 - 08/15/23 1900 08/15/23 1901 - 08/16/23 0700 08/16/23 0701 - 08/16/23 1900 08/16/23 1901 - 08/17/23 0700 08/17/23 0701 - 08/17/23 0733  Requested LDAs do not have output data documented.     Assessment/Plan:  [name]  s/p C2 dome laminectomy, C3-6 laminoplasty  On 1/21 with return to OR on 1/25 for cervical/thoracic laminectomy and epidural hematoma evacuation.  She has had a complicated postoperative course with a spontaneous cervical thoracic hemorrhage on 125 which was taken emergently for decompression.  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  - MRI of neuro axis 08/10/23 showing diffuse central spinal cord swelling concerning for spinal cord infarct.  DWI series repeated 2/4  which did not show restricted diffusion.  - pain control continue with IV and oral medications - HV removed on 1/29 - pneumonia; recommendations per CC  - EEG showing moderate to severe encephalopathy  - plans for trach 2/10 - CC and IM assisting with non surgical management. We appreciate your assistance.  -  OK to remove staples today  Anastacio Karvonen PA-C Department of Neurosurgery

## 2023-08-17 NOTE — Progress Notes (Signed)
 Nutrition Follow Up Note   DOCUMENTATION CODES:   Non-severe (moderate) malnutrition in context of chronic illness  INTERVENTION:   Resume Osmolite 1.5@60ml /hr + ProSource TF 20- Give 60ml daily via tube  Free water  flushes q6 hours to maintain tube patency   Regimen provides 2240kcal/day, 110g/day protein and 1454ml/day of free water .   Juven Fruit Punch BID via tube, each serving provides 95kcal and 2.5g of protein (amino acids  glutamine and arginine)  Daily weights   NUTRITION DIAGNOSIS:   Moderate Malnutrition related to chronic illness as evidenced by mild fat depletion, moderate fat depletion, mild muscle depletion, severe muscle depletion. -ongoing   GOAL:   Provide needs based on ASPEN/SCCM guidelines -met   MONITOR:   Vent status, Labs, Weight trends, TF tolerance, Skin, I & O's  ASSESSMENT:   68 y/o female with h/o DM, CKD III, thyroid cancer s/p total thyroidectomy 01/2023, GERD, diverticulitis, esophageal stricture s/p dilation 04/2022 who is admitted with cervical central cord injury after a trauma last year now s/p elective posterior cervical laminoplasty C3-6 and C2 inferior dome laminectomy 1/21 complicated by HCAP, AKI and epidural hematoma s/p emergent cervical decompression and evacuation 1/26.  -Pt s/p surgical G-tube placement 2/5.  -Pt s/p tracheostomy today   Pt remains sedated and ventilated. Pt s/p tracheostomy this morning. Tube feeds held overnight but have been restarted. Pt with new DTI to her sacrum; will add Juven. Per chart, pt is up ~19lbs since admission. Pt +11.2L on her I & Os. Pt given IV fluids over the weekend secondary to hypernatremia which is now resolved. RD will decrease free water .   Medications reviewed and include: insulin , synthroid , protonix   Labs reviewed: Na 141 wnl, K 4.8 wnl, BUN 73(H), creat 1.55H), P 2.5 wnl Hgb 8.8(L), Hct 27.9(L) Cbgs- 184, 188, 191 x 24 hrs  Patient is currently intubated on ventilator  support MV: 6.7 L/min Temp (24hrs), Avg:98.9 F (37.2 C), Min:97.4 F (36.3 C), Max:99.9 F (37.7 C)  MAP- >39mmHg   UOP-   Diet Order:   Diet Order             Diet NPO time specified  Diet effective midnight                  EDUCATION NEEDS:   Not appropriate for education at this time  Skin:  Skin Assessment: Reviewed RN Assessment (incision back and neck)  Last BM:  2/10- via rectal tube  Height:   Ht Readings from Last 1 Encounters:  07/28/23 5\' 7"  (1.702 m)    Weight:   Wt Readings from Last 1 Encounters:  08/17/23 67.4 kg    Ideal Body Weight:  61.36 kg  BMI:  Body mass index is 23.27 kg/m.  Estimated Nutritional Needs:   Kcal:  1900-2200kcal/day  Protein:  95-110g/day  Fluid:  1.8-2.1L/day  Torrance Freestone MS, RD, LDN If unable to be reached, please send secure chat to "RD inpatient" available from 8:00a-4:00p daily

## 2023-08-17 NOTE — Progress Notes (Signed)
 Patient evaluated.  No acute events overnight.  Vasopressin  continuing at low dose.  Consent on chart.  Hgb and platelets slight drop but relatively stable.  Cryo per Hematology.  OK to proceed with tracheostomy.

## 2023-08-17 NOTE — Transfer of Care (Signed)
 Immediate Anesthesia Transfer of Care Note  Patient: [name]  Procedure(s) Performed: TRACHEOSTOMY  Patient Location: ICU  Anesthesia Type:General  Level of Consciousness: sedated and drowsy  Airway & Oxygen Therapy: Patient placed on Ventilator (see vital sign flow sheet for setting)  Post-op Assessment: Report given to RN and Post -op Vital signs reviewed and stable  Post vital signs: Reviewed and stable  Last Vitals:  Vitals Value Taken Time  BP 152/73 0838  Temp 35.9 0838  Pulse 68 0838  Resp 16 0838  SpO2 99 0838    Last Pain:  Vitals:   08/17/23 0531  TempSrc:   PainSc: Asleep      Patients Stated Pain Goal: 0 (07/29/23 1955)  Complications: No notable events documented.

## 2023-08-17 NOTE — Progress Notes (Signed)
 NAME:  [name], MRN:  [id number], DOB:  Feb 07, 1956, LOS: 20 ADMISSION DATE:  07/28/2023 History of Present Illness:  [name] is a 68 year old old female with PMHx of hypothyroidism, insulin -dependent DM, CKD stage III, dysphagia with esophageal strictures, facial cellulitis, GERD, neuropathy, arthritis, blood dyscrasia, and recent traumatic cervical central cord injury who presented to the hospital for elective cervical spine surgery.   Per patient's chart, patient was seen by neurosurgery on 07/28/2023 for follow-up after suffering a fall 3 months ago in which she sustained a cervical spinal cord injury.  Per patient, they deferred emergent surgical depression and wanted to let her have time to recover and work with physical therapy.  She continued to have hand weakness as well as lower extremity weakness and ambulatory issues.  Given the ongoing compression of C3-C6, laminoplasty and C2 dome laminectomy was indicated.     Hospital course: Patient underwent C3-6 laminoplasty and C2 dome and laminectomy surgery on 07/29/2023 with drain placement.   Pertinent Labs/Diagnostics Findings: Na+/ K+: 141/4.4 Glucose: 136 BUN/Cr.47/1.42 WBC: 5.8 K/L  Hgb/Hct: 7.4/22.4  Pertinent  Medical History  Hypothyroidism, insulin -dependent DM, CKD stage III, dysphagia with esophageal strictures, facial cellulitis, GERD, neuropathy, arthritis, blood dyscrasia, and recent traumatic cervical central cord injury    Significant Hospital Events: Including procedures, antibiotic start and stop dates in addition to other pertinent events   01/21: Patient underwent posterior C3-6 Cervical Laminoplasty with C2 inferior dome Laminectomy 01/22: POD #1. Pt. stable with no acute changes.  Working with PT and OT 01/23: POD #2.  Pt. noted to be very lethargic and difficult to arouse 01/24: POD #3. Pt. developed hypoxia tachycardia and shortness of breath.  Hospitalist consulted CT chest obtained and showed possible  pneumonia.  Started on IV antibiotics and DuoNebs. 01/25: POD #4. Pt. with new concerns of not moving the left arm.  CT head and cervical spine obtained with no new concerns.  Follow-up MRI however demonstrated epidural hematoma extending from the cervical spine to the lower thoracic spine with acute compression and T2 signal.  Taken emergently to the OR for surgical decompression 01/26: Pt. transferred to the ICU s/p emergent cervical decompression and evacuation of epidural hematoma.  Remained intubated. PCCM consulted 08/03/23- s/p surgery overnight, alert to self but drowsy with mild AKI and metabolic acidosis.  Lethargy noted have dcd cefepime  and started doxycycline  instead.  08/04/23- patient more alert able to wiggle toes to verbal.  Remains critically ill.  08/05/23- patient appears to have hypoactive delerium with waxing and waning sensorium.  At times she asks for things other times she's poorly responsive.  Her oxygenation has improved with nasal canula 5L/min.  S/p heme/onc evaluation today 08/06/23- patient seen at bedside, she was able to whisper few words tome.  I discussed PO intake with speech pathologist and SLP with barium was ordered however patient unable to complete this study. She is still very weak physically barely able to wiggle toes.  H/h <7 s/p prbc.  Renal function is improved.  08/07/23- patient had worsening hypoxemia with CXR showing worsening complete left opacification. I discussed this with neurosurgery and they stated her cervical spine did not have any new hardware so intubation should not be complicated. I met with PA Anastacio Karvonen at bedside  to review care plan. We discussed these findings with husband and proceeded with intubation and bronchoscopy.  The entire left lung appeared completely full of mucus impaction in both upper and lower lobes during bronchoscopy.  CXR post  bronchoscopy is in process.  08/08/23- patient is extubated and is able to speak few words with  family.  She moved her feet and right hand today.  I met with husband and son at bedside reviewed imaging and bloodwork. 08/09/23- patient had severe event overnight with hypoxemia , CXR This am with complete opacification of left lung again.  She went into respiratory distress overnight with severe hypoxemia.  She was emergently intubated overnight. WE notified familly and discussed events and imaging findings. She is s/p bronch with improved CXR post procedure.  08/10/23 - remains weak and minimally responsive, flaccid upper and lower extremities 08/11/23 - MRI performed overnight. Remains minimally responsive but does open eyes and track. PEG tube placed by surgery 08/12/23 - acute drop in hemoglobin. Mental status unchanged 08/13/23 - non-responsive. Remains weak, but moving upper extremity non-purposefully 08/14/23 - opens eyes, moves arms non-purposefully 08/15/23 - opens eyes, moving upper extremities, doesn't follow commands 08/16/23 - off neo, on vaso. Eyes open, moving upper and lower extremities non-purposefully   Interim History / Subjective:  S/P Tracheostomy tube placement in this AM. Doing well. Awake and alert and following commands. On minimal vent settings. Switched to PSV.   Objective   Blood pressure (!) 126/57, pulse (!) 58, temperature 97.7 F (36.5 C), resp. rate 19, height 5\' 7"  (1.702 m), weight 67.4 kg, SpO2 93%.    Vent Mode: PRVC FiO2 (%):  [30 %] 30 % Set Rate:  [16 bmp] 16 bmp Vt Set:  [450 mL] 450 mL PEEP:  [8 cmH20] 8 cmH20   Intake/Output Summary (Last 24 hours) at 08/17/2023 0944 Last data filed at 08/17/2023 0840 Gross per 24 hour  Intake 2241.29 ml  Output 2115 ml  Net 126.29 ml   Filed Weights   08/15/23 0500 08/16/23 0500 08/17/23 0432  Weight: 67.7 kg 68.1 kg 67.4 kg    Examination: General: Awake and alert. Following commands.  HENT: s/p Trach tube placement. Shiley size 6.  Lungs: Coarse breath sounds bilaterally.  Cardiovascular: Normal S1, Normal S2,  RRR Abdomen: Soft, non tender, non distended, +BS.  Extremities: Warm and well perfused.   Labs and imaging were reviewed.   Assessment & Plan:  [name] is a 68 year old old female with PMHx of hypothyroidism, insulin -dependent DM, CKD stage III, dysphagia with esophageal strictures, facial cellulitis, GERD, neuropathy, arthritis, blood dyscrasia, and recent traumatic cervical central cord injury who presented to the hospital for elective cervical spine surgery. S/p C2 dome laminectomy on 01/21. Course c/p by epidural hematoma s/p evacuation and cervical and thoracic laminectomy on 01/25.  Courser further comlicated by hypoxic respiratory failure and inability to protect the her airways now s/p Trach on 02/10. S/p PEG tube placement 02/04.   #Acute hypoxic respiratory failure s/p trach placement 02/10 secondary to... #Aspiration pneumonia and inability to protect airways in the setting of...  #C2 dome laminectomy 01/21 c/n epidural hematoma s/p decompression and cervical and thoracic laminectomy on 01/25.  #c/b Autonomic dysfunction now on minimal dose vasopressin   #Anemia with Hgb downtrending now at 8.8mg /dl #AKI on CKD stage IIIb likely in the setting of pre renal azotemia Cr improving now at 1.5mg /dl.   Neuro: Delirium precautions.  CVS: on vasopressin  wean off to target MAP > 65. Mid need to start Fludrocort.  Lungs: Wean down vent support for SpO2 > 90%. Trial PSV and wean as tolerated.  GI: Restart TF. Lax PRN. Ensure bowel movement. PPI for prophylaxis.  Endo: POC 140-180. C/w home levothyroxine . Heme:  SCDs for DVT prophylaxis. CBC in the pm. If stable can restart DVT prophylaxis tomorrow.  Renal: Avoid nephrotox agents.   Dispo: Will start LTACH referral in the am.   Best Practice (right click and "Reselect all SmartList Selections" daily)   Diet/type: tubefeeds DVT prophylaxis SCD Pressure ulcer(s): N/A GI prophylaxis: PPI Lines: Central line Foley:  removal ordered   Code Status:  full code  Last date of multidisciplinary goals of care discussion [08/17/2023]  I spent 60 minutes caring for this patient today, including preparing to see the patient, obtaining a medical history , reviewing a separately obtained history, performing a medically appropriate examination and/or evaluation, ordering medications, tests, or procedures, documenting clinical information in the electronic health record, and independently interpreting results (not separately reported/billed) and communicating results to the patient/family/caregiver  Annitta Kindler, MD [facility] 08/17/2023 12:12 PM

## 2023-08-17 NOTE — Progress Notes (Signed)
 Daily Progress Note   Patient Name: [name]       Date: 08/17/2023 DOB: 1955-10-03  Age: 68 y.o. MRN#: [id number] Attending Physician: Carroll Clamp, MD Primary Care Physician: Lyle San, MD Admit Date: 07/28/2023  Reason for Consultation/Follow-up: Establishing goals of care  Subjective: Notes and labs reviewed. Patient had trach placement this morning. PMT will follow for wake up assessments.   Length of Stay: 20  Current Medications: Scheduled Meds:  . sodium chloride    Intravenous Once  . sodium chloride    Intravenous Once  . sodium chloride    Intravenous Once  . Chlorhexidine  Gluconate Cloth  6 each Topical Daily  . feeding supplement (PROSource TF20)  60 mL Per Tube Daily  . free water   200 mL Per Tube Q4H  . insulin  aspart  0-15 Units Subcutaneous Q4H  . insulin  glargine-yfgn  5 Units Subcutaneous Daily  . levothyroxine   88 mcg Per Tube QAC breakfast  . mouth rinse  15 mL Mouth Rinse Q2H  . pantoprazole  (PROTONIX ) IV  40 mg Intravenous Q12H  . sodium chloride  flush  10-40 mL Intracatheter Q12H  . sodium chloride  flush  3-10 mL Intravenous Q12H  . sodium chloride  flush  3-10 mL Intravenous Q12H    Continuous Infusions: . dextrose  50 mL/hr at 08/16/23 1900  . feeding supplement (OSMOLITE 1.5 CAL) Stopped (08/17/23 0014)  . vasopressin  Stopped (08/17/23 0830)    PRN Meds: acetaminophen , bisacodyl , docusate, fentaNYL  (SUBLIMAZE ) injection, fentaNYL  (SUBLIMAZE ) injection, menthol -cetylpyridinium **OR** [DISCONTINUED] phenol, mouth rinse, polyethylene glycol, sodium chloride  flush, sodium chloride  flush, sodium chloride  flush  Physical Exam Constitutional:      Comments: Eyes closed.   Pulmonary:     Comments: On ventilator.             Vital Signs: BP  (!) 126/57   Pulse (!) 58   Temp 97.7 F (36.5 C)   Resp 19   Ht 5\' 7"  (1.702 m)   Wt 67.4 kg   SpO2 93%   BMI 23.27 kg/m  SpO2: SpO2: 93 % O2 Device: O2 Device: Ventilator O2 Flow Rate: O2 Flow Rate (L/min): 45 L/min  Intake/output summary:  Intake/Output Summary (Last 24 hours) at 08/17/2023 0938 Last data filed at 08/17/2023 0840 Gross per 24 hour  Intake 2241.29 ml  Output 2115 ml  Net  126.29 ml   LBM: Last BM Date : 08/17/23 Baseline Weight: Weight: 58.9 kg Most recent weight: Weight: 67.4 kg   Patient Active Problem List   Diagnosis Date Noted  . Toxic metabolic encephalopathy 08/12/2023  . Myelopathy of thoracic region 08/04/2023  . Hematoma 08/04/2023  . Spinal stenosis, thoracic 08/04/2023  . Malnutrition of moderate degree 08/03/2023  . Lobar pneumonia (HCC) 08/01/2023  . MRSA (methicillin resistant staph aureus) culture positive 08/01/2023  . Cervical myelopathy (HCC) 07/28/2023  . Spinal stenosis in cervical region 06/18/2023  . Spinal cord injury at C5-C7 level without injury of spinal bone (HCC) 06/18/2023  . Arm weakness 06/18/2023  . Numbness and tingling 06/18/2023  . Spasticity 06/15/2023  . Orthostatic hypotension 06/15/2023  . Esophageal stricture 05/01/2023  . Dysphagia 04/30/2023  . Odynophagia 04/30/2023  . Gastroesophageal reflux disease 04/30/2023  . Acute incomplete quadriplegia (HCC) 04/23/2023  . Spinal cord injury, cervical region (HCC) 04/22/2023  . Spinal cord compression, post-traumatic (HCC) 04/13/2023  . Central cord syndrome (HCC) 04/13/2023  . Muscle weakness of right upper extremity 04/13/2023  . Weakness of right lower extremity 04/13/2023  . Pelvic fracture (HCC) 04/13/2023  . Cellulitis 08/12/2021  . Facial cellulitis 08/11/2021  . Dental caries 08/11/2021  . COVID-19 virus infection 08/11/2021  . Diabetes mellitus without complication (HCC)   . Hypothyroidism   . Stage 3a chronic kidney disease (HCC)   .  Non-dose-related adverse reaction to medication   . Post-operative state 03/16/2017  . AKI (acute kidney injury) (HCC) 01/18/2017  . Acute hypoxic respiratory failure (HCC) 11/04/2015    Palliative Care Assessment & Plan    Recommendations/Plan:  PMT will follow for wake up.      Code Status:    Code Status Orders  (From admission, onward)           Start     Ordered   07/28/23 1459  Full code  Continuous       Question:  By:  Answer:  Procedural case: previous code status reviewed   07/28/23 1458           Code Status History     Date Active Date Inactive Code Status Order ID Comments User Context   04/22/2023 1239 05/12/2023 1531 Full Code [id number]  Lacretia Piccolo, PA-C Inpatient   04/22/2023 1239 04/22/2023 1239 Full Code [id number]  Lacretia Piccolo, PA-C Inpatient   04/13/2023 1640 04/22/2023 1237 Full Code [id number]  Theodora Fish, MD ED   04/13/2023 1629 04/13/2023 1640 Full Code [id number]  Cleve Dale, MD ED   08/11/2021 2222 08/16/2021 1950 Full Code [id number]  Lanetta Pion, MD ED   03/16/2017 1059 03/17/2017 1935 Full Code [id number]  Schermerhorn, Joselyn Nicely, MD Inpatient   01/18/2017 1745 01/19/2017 2033 Full Code [id number]  Melvinia Stager, MD ED   11/04/2015 1219 11/06/2015 2047 Full Code [id number]  Christina Coyer, MD ED       Thank you for allowing the Palliative Medicine Team to assist in the care of this patient.   Meribeth Standard, NP  Please contact Palliative Medicine Team phone at [phone] for questions and concerns.

## 2023-08-17 NOTE — Anesthesia Preprocedure Evaluation (Signed)
 Anesthesia Evaluation  Patient identified by MRN, date of birth, ID band Patient awake  General Assessment Comment:Patient with PSH of cervical spine surgery on 07/28/2023. Patient then found to have epidural hematoma cause left sided weakness. Patient is intubated but awake and moving upper extremities spontaneously, following commands as well. Requiring vasopressin  support, no a-line in place   Reviewed: Allergy & Precautions, NPO status , Patient's Chart, lab work & pertinent test results  History of Anesthesia Complications Negative for: history of anesthetic complications  Airway Mallampati: Intubated  TM Distance: >3 FB     Dental  (+) Edentulous Upper, Edentulous Lower   Pulmonary pneumonia, unresolved   Pulmonary exam normal breath sounds clear to auscultation       Cardiovascular negative cardio ROS Normal cardiovascular exam Rhythm:Regular Rate:Normal  ECG 04/13/23: NSR; RBBB  Echo 07/16/22: NORMAL STRESS ECHOCARDIOGRAM. NORMAL RESTING STUDY WITH NO WALL MOTION ABNORMALITIES AT REST AND PEAK EXERCISE.  Maximum workload of 5.7 METs was achieved during exercise.     Neuro/Psych  Neuromuscular disease (neuropathy) negative neurological ROS  negative psych ROS   GI/Hepatic negative GI ROS, Neg liver ROS,GERD  ,,  Endo/Other  negative endocrine ROSdiabetes, Type 2Hypothyroidism    Renal/GU Renal disease     Musculoskeletal  (+) Arthritis ,    Abdominal   Peds  Hematology negative hematology ROS (+) Blood dyscrasia, anemia Possible bleeding disorder    Anesthesia Other Findings  Past Medical History: No date: Acute incomplete quadriplegia (HCC) No date: Acute respiratory failure with hypoxia (HCC) No date: AKI (acute kidney injury) (HCC) No date: Arthritis No date: Blood dyscrasia     Comment:  patient stated she is a "free bleeder" No date: Central cord syndrome (HCC) No date: Dental caries No date:  Diabetes mellitus without complication (HCC) No date: Diverticulosis No date: Dysphagia No date: Esophageal stricture No date: Facial cellulitis No date: GERD (gastroesophageal reflux disease) No date: Hypothyroidism No date: Neuropathy No date: Odynophagia No date: Pelvic fracture (HCC) No date: Renal insufficiency No date: Spinal cord compression, post-traumatic (HCC) No date: Spinal cord injury, cervical region (HCC) No date: Stage 3a chronic kidney disease (CKD) (HCC)  Past Surgical History: No date: ABDOMINAL HYSTERECTOMY 03/16/2017: BILATERAL SALPINGECTOMY; Bilateral     Comment:  Procedure: BILATERAL SALPINGECTOMY;  Surgeon:               Schermerhorn, Joselyn Nicely, MD;  Location: [facility] OR[facility];                Service: Gynecology;  Laterality: Bilateral; 05/03/2023: BIOPSY     Comment:  Procedure: BIOPSY;  Surgeon: Ace Holder, MD;                Location: [facility];  Service: Gastroenterology;; 08/18/2018: COLONOSCOPY WITH PROPOFOL ; N/A     Comment:  Procedure: COLONOSCOPY WITH PROPOFOL ;  Surgeon: Cassie Click, MD;  Location: [facility] ENDOSCOPY;  Service:               Endoscopy;  Laterality: N/A; 03/16/2017: CYSTOCELE REPAIR; N/A     Comment:  Procedure: ANTERIOR REPAIR (CYSTOCELE);  Surgeon:               Schermerhorn, Joselyn Nicely, MD;  Location: [facility] ORS;                Service: Gynecology;  Laterality: N/A; 08/18/2018: ESOPHAGOGASTRODUODENOSCOPY (EGD) WITH PROPOFOL ; N/A     Comment:  Procedure: ESOPHAGOGASTRODUODENOSCOPY (EGD) WITH               PROPOFOL ;  Surgeon: Cassie Click, MD;  Location:               [facility];  Service: Endoscopy;  Laterality: N/A; 05/01/2023: ESOPHAGOGASTRODUODENOSCOPY (EGD) WITH PROPOFOL ; Left     Comment:  Procedure: ESOPHAGOGASTRODUODENOSCOPY (EGD) WITH               PROPOFOL ;  Surgeon: Ace Holder, MD;  Location:               [facility];  Service: Gastroenterology;  Laterality:                Left; 05/03/2023: ESOPHAGOGASTRODUODENOSCOPY (EGD) WITH PROPOFOL ; N/A     Comment:  Procedure: ESOPHAGOGASTRODUODENOSCOPY (EGD) WITH               PROPOFOL ;  Surgeon: Ace Holder, MD;  Location:               [facility];  Service: Gastroenterology;  Laterality:               N/A; 07/28/2023: POSTERIOR CERVICAL LAMINECTOMY; N/A     Comment:  Procedure: C2 DOME LAMINECTOMY;  Surgeon: Carroll Clamp, MD;  Location: [facility] ORS;  Service: Neurosurgery;                Laterality: N/A; No date: TEAR DUCT PROBING; Right No date: TUBAL LIGATION 03/16/2017: VAGINAL HYSTERECTOMY; N/A     Comment:  Procedure: HYSTERECTOMY VAGINAL;  Surgeon: Carolynn Citrin, MD;  Location: [facility] ORS;  Service: Gynecology;               Laterality: N/A; No date: WISDOM TOOTH EXTRACTION  BMI    Body Mass Index: 20.34 kg/m      Reproductive/Obstetrics negative OB ROS                             Anesthesia Physical Anesthesia Plan  ASA: 4  Anesthesia Plan: General ETT   Post-op Pain Management: Minimal or no pain anticipated   Induction: Intravenous  PONV Risk Score and Plan: 3 and Ondansetron  and Dexamethasone   Airway Management Planned: Oral ETT  Additional Equipment:   Intra-op Plan:   Post-operative Plan: Post-operative intubation/ventilation  Informed Consent: I have reviewed the patients History and Physical, chart, labs and discussed the procedure including the risks, benefits and alternatives for the proposed anesthesia with the patient or authorized representative who has indicated his/her understanding and acceptance.     Dental Advisory Given and Consent reviewed with POA  Plan Discussed with: Anesthesiologist, CRNA and Surgeon  Anesthesia Plan Comments: (Patient consented for risks of anesthesia including but not limited to:  - adverse reactions to medications - damage to eyes, teeth, lips or other oral mucosa -  nerve damage due to positioning  - sore throat or hoarseness - Damage to heart, brain, nerves, lungs, other parts of body or loss of life  Patient voiced understanding and assent.)       Anesthesia Quick Evaluation

## 2023-08-18 ENCOUNTER — Encounter: Payer: Self-pay | Admitting: Otolaryngology

## 2023-08-18 DIAGNOSIS — N1832 Chronic kidney disease, stage 3b: Secondary | ICD-10-CM | POA: Diagnosis not present

## 2023-08-18 DIAGNOSIS — J690 Pneumonitis due to inhalation of food and vomit: Secondary | ICD-10-CM | POA: Diagnosis not present

## 2023-08-18 DIAGNOSIS — G959 Disease of spinal cord, unspecified: Secondary | ICD-10-CM | POA: Diagnosis not present

## 2023-08-18 DIAGNOSIS — Z7189 Other specified counseling: Secondary | ICD-10-CM | POA: Diagnosis not present

## 2023-08-18 DIAGNOSIS — J9601 Acute respiratory failure with hypoxia: Secondary | ICD-10-CM | POA: Diagnosis not present

## 2023-08-18 DIAGNOSIS — M4804 Spinal stenosis, thoracic region: Secondary | ICD-10-CM | POA: Diagnosis not present

## 2023-08-18 LAB — GLUCOSE, CAPILLARY
Glucose-Capillary: 181 mg/dL — ABNORMAL HIGH (ref 70–99)
Glucose-Capillary: 193 mg/dL — ABNORMAL HIGH (ref 70–99)
Glucose-Capillary: 204 mg/dL — ABNORMAL HIGH (ref 70–99)
Glucose-Capillary: 218 mg/dL — ABNORMAL HIGH (ref 70–99)
Glucose-Capillary: 230 mg/dL — ABNORMAL HIGH (ref 70–99)
Glucose-Capillary: 243 mg/dL — ABNORMAL HIGH (ref 70–99)
Glucose-Capillary: 243 mg/dL — ABNORMAL HIGH (ref 70–99)

## 2023-08-18 LAB — PREPARE CRYOPRECIPITATE
Unit division: 0
Unit division: 0

## 2023-08-18 LAB — CBC
HCT: 30.6 % — ABNORMAL LOW (ref 36.0–46.0)
Hemoglobin: 9.5 g/dL — ABNORMAL LOW (ref 12.0–15.0)
MCH: 30.5 pg (ref 26.0–34.0)
MCHC: 31 g/dL (ref 30.0–36.0)
MCV: 98.4 fL (ref 80.0–100.0)
Platelets: 107 10*3/uL — ABNORMAL LOW (ref 150–400)
RBC: 3.11 MIL/uL — ABNORMAL LOW (ref 3.87–5.11)
RDW: 16 % — ABNORMAL HIGH (ref 11.5–15.5)
WBC: 5.8 10*3/uL (ref 4.0–10.5)
nRBC: 0 % (ref 0.0–0.2)

## 2023-08-18 LAB — CREATININE, SERUM
Creatinine, Ser: 1.58 mg/dL — ABNORMAL HIGH (ref 0.44–1.00)
GFR, Estimated: 36 mL/min — ABNORMAL LOW (ref >60)

## 2023-08-18 LAB — BPAM CRYOPRECIPITATE
Blood Product Expiration Date: 202502100943
Blood Product Expiration Date: 202502100943
ISSUE DATE / TIME: 202502100411
ISSUE DATE / TIME: 202502100411
Unit Type and Rh: 5100
Unit Type and Rh: 6200

## 2023-08-18 LAB — BASIC METABOLIC PANEL
Anion gap: 11 (ref 5–15)
BUN: 80 mg/dL — ABNORMAL HIGH (ref 8–23)
CO2: 23 mmol/L (ref 22–32)
Calcium: 7.2 mg/dL — ABNORMAL LOW (ref 8.9–10.3)
Chloride: 108 mmol/L (ref 98–111)
Creatinine, Ser: 1.5 mg/dL — ABNORMAL HIGH (ref 0.44–1.00)
GFR, Estimated: 38 mL/min — ABNORMAL LOW (ref >60)
Glucose, Bld: 201 mg/dL — ABNORMAL HIGH (ref 70–99)
Potassium: 4.5 mmol/L (ref 3.5–5.1)
Sodium: 142 mmol/L (ref 135–145)

## 2023-08-18 LAB — BPAM PLATELET PHERESIS
Blood Product Expiration Date: 202502132359
ISSUE DATE / TIME: 202502100734
Unit Type and Rh: 5100

## 2023-08-18 LAB — PREPARE PLATELET PHERESIS: Unit division: 0

## 2023-08-18 LAB — CULTURE, RESPIRATORY W GRAM STAIN

## 2023-08-18 LAB — MAGNESIUM: Magnesium: 2.1 mg/dL (ref 1.7–2.4)

## 2023-08-18 LAB — PHOSPHORUS: Phosphorus: 3.6 mg/dL (ref 2.5–4.6)

## 2023-08-18 MED ORDER — GABAPENTIN 250 MG/5ML PO SOLN
100.0000 mg | Freq: Two times a day (BID) | ORAL | Status: DC
  Administered 2023-08-18 – 2023-08-20 (×5): 100 mg
  Filled 2023-08-18 (×5): qty 2

## 2023-08-18 MED ORDER — MIDODRINE HCL 5 MG PO TABS
10.0000 mg | ORAL_TABLET | Freq: Three times a day (TID) | ORAL | Status: DC
  Administered 2023-08-18 – 2023-08-21 (×10): 10 mg
  Filled 2023-08-18 (×10): qty 2

## 2023-08-18 MED ORDER — FLUOXETINE HCL 20 MG PO CAPS
20.0000 mg | ORAL_CAPSULE | Freq: Every day | ORAL | Status: DC
  Administered 2023-08-18 – 2023-08-21 (×4): 20 mg
  Filled 2023-08-18 (×4): qty 1

## 2023-08-18 MED ORDER — HEPARIN SODIUM (PORCINE) 5000 UNIT/ML IJ SOLN
5000.0000 [IU] | Freq: Three times a day (TID) | INTRAMUSCULAR | Status: DC
  Administered 2023-08-18 – 2023-08-20 (×8): 5000 [IU] via SUBCUTANEOUS
  Filled 2023-08-18 (×8): qty 1

## 2023-08-18 MED ORDER — FUROSEMIDE 10 MG/ML IJ SOLN
20.0000 mg | Freq: Once | INTRAMUSCULAR | Status: AC
  Administered 2023-08-18: 20 mg via INTRAVENOUS
  Filled 2023-08-18: qty 2

## 2023-08-18 NOTE — Evaluation (Addendum)
Occupational Therapy Re-Evaluation Patient Details Name: [name] MRN: [id number] DOB: [birth year] Today's Date: 08/18/2023   History of Present Illness   History of Present Illness: Pt is a 68 y/o F admitted on 07/28/23 for elective C2 dome laminectomy, C3-6 laminoplasty. MRI on 08/01/23 showed epidural hematoma extending from the cervical spine to the lower thoracic spine with acute compression & T2 signal. Pt taken emergently to the OR for surgical decompression. Pt is s/p PEG placement 08/11/23, trach placement 08/17/23. PMH: hypothyroidism, IDDM, CKD 3, dysphagia with esophageal strictures, facial cellulitis, GERD, neuropathy, arthritis, blood dyscrasia, recent traumatic cervical central cord injury   Clinical Impressions Ms Summer was seen for OT/PT re-evaluation this date following recent tracheostomy placement. Pt currently requires TOTAL A sup<>long sitting. TOTAL A hand over hand face washing and LB dressing at bed level. Noted RUE MMT 2-/5 shoulder and bicep, 1/5 trace movement in digits. LUE grossly 0/5. Pt follows 25% of commands in session. Initially lethargic, improved with HOB 45* and fair tolerance with RR up to 30. Pt would benefit from skilled OT to address noted impairments and functional limitations (see below for any additional details). Upon hospital discharge, recommend OT follow up.     If plan is discharge home, recommend the following:   Two people to help with walking and/or transfers;Two people to help with bathing/dressing/bathroom     Functional Status Assessment   Patient has had a recent decline in their functional status and demonstrates the ability to make significant improvements in function in a reasonable and predictable amount of time.     Equipment Recommendations   Hospital bed;Hoyer lift     Recommendations for Other Services         Precautions/Restrictions   Precautions Precautions: Cervical;Fall Restrictions Weight Bearing  Restrictions Per Provider Order: No     Mobility Bed Mobility               General bed mobility comments: TOTAL A sup<>long sitting     Transfers                   General transfer comment: unsafe to attmept          ADL either performed or assessed with clinical judgement   ADL Overall ADL's : Needs assistance/impaired                                       General ADL Comments: TOTAL A hand over hand face washing at bed level. TOTAL A for LB dressing at bed level      Pertinent Vitals/Pain Pain Assessment Pain Assessment: CPOT Facial Expression: Relaxed, neutral Body Movements: Absence of movements Muscle Tension: Relaxed Compliance with ventilator (intubated pts.): Tolerating ventilator or movement Vocalization (extubated pts.): N/A CPOT Total: 0     Extremity/Trunk Assessment Upper Extremity Assessment Upper Extremity Assessment: RUE deficits/detail;LUE deficits/detail;Difficult to assess due to impaired cognition RUE Deficits / Details: 2-/5 shoulder and bicep with flickers 1/5 noted in digits. LUE Deficits / Details: no appreciable movement noticed 0/5 grossly   Lower Extremity Assessment Lower Extremity Assessment: RLE deficits/detail;LLE deficits/detail RLE Deficits / Details: withdraws to pain       Communication Communication Communication: Impaired Factors Affecting Communication: Trach/intubated   Cognition Arousal: Lethargic Behavior During Therapy: Flat affect Cognition: Difficult to assess Difficult to assess due to: Tracheostomy (ventilator)  OT - Cognition Comments: increased arousal with HOB 45*, shakes head no but does not mouth anything this session. Follows commands 25% of time in session (? cognition vs sever weakness)                 Following commands: Impaired Following commands impaired: Follows one step commands inconsistently     Cueing  General Comments   Cueing  Techniques: Verbal cues;Gestural cues;Tactile cues;Visual cues      Exercises     Shoulder Instructions      Home Living Family/patient expects to be discharged to:: Private residence Living Arrangements: Spouse/significant other (husband & disabled adult son) Available Help at Discharge: Family Type of Home: Apartment Home Access: Level entry     Home Layout: One level     Bathroom Shower/Tub: Chief Strategy Officer: Standard     Home Equipment: Wheelchair - Forensic psychologist (2 wheels);Other (comment)   Additional Comments: adjustable bed with new rail to prevent falling out of bed again (all information obtained from previous entry)      Prior Functioning/Environment Prior Level of Function :  (all information taken from previous entry)             Mobility Comments: since home from CIR, has been AMB short distnaces without device in past couple weeks, inititally required RW; chronic orthostatic hypotension on midodrine ADLs Comments: , Pt reports independent with all ADL/IADL prior to hospital admission.    OT Problem List: Decreased strength;Decreased range of motion;Decreased activity tolerance;Impaired balance (sitting and/or standing);Decreased safety awareness;Pain;Decreased cognition;Cardiopulmonary status limiting activity   OT Treatment/Interventions: Self-care/ADL training;Therapeutic exercise;Energy conservation;DME and/or AE instruction;Therapeutic activities;Neuromuscular education;Balance training;Patient/family education      OT Goals(Current goals can be found in the care plan section)   Acute Rehab OT Goals Patient Stated Goal: unable to state OT Goal Formulation: Patient unable to participate in goal setting Time For Goal Achievement: 09/01/23 Potential to Achieve Goals: Poor ADL Goals Pt Will Perform Grooming: with mod assist;bed level Pt Will Perform Lower Body Dressing: with max assist;bed level Pt Will Transfer to  Toilet: with max assist (rolling bed level) Additional ADL Goal #1: Pt will follow 75% of commands in preparation for ADL participation   OT Frequency:  Min 1X/week    Co-evaluation PT/OT/SLP Co-Evaluation/Treatment: Yes Reason for Co-Treatment: Complexity of the patient's impairments (multi-system involvement);Necessary to address cognition/behavior during functional activity;For patient/therapist safety PT goals addressed during session: Strengthening/ROM OT goals addressed during session: ADL's and self-care      AM-PAC OT "6 Clicks" Daily Activity     Outcome Measure Help from another person eating meals?: Total Help from another person taking care of personal grooming?: Total Help from another person toileting, which includes using toliet, bedpan, or urinal?: Total Help from another person bathing (including washing, rinsing, drying)?: Total Help from another person to put on and taking off regular upper body clothing?: Total Help from another person to put on and taking off regular lower body clothing?: Total 6 Click Score: 6   End of Session Nurse Communication: Mobility status  Activity Tolerance: Patient limited by lethargy Patient left: in bed;with call bell/phone within reach;with bed alarm set;with nursing/sitter in room  OT Visit Diagnosis: Unsteadiness on feet (R26.81);Muscle weakness (generalized) (M62.81)                Time: 1610-9604 OT Time Calculation (min): 13 min Charges:  OT General Charges $OT Visit: 1 Visit OT Evaluation $OT Re-eval: 1 Re-eval  Kathie Dike, M.S. OTR/L  08/18/23, 3:27 PM  ascom [phone]

## 2023-08-18 NOTE — Progress Notes (Addendum)
Bladder scan performed.  No urine output noted x 6 hrs.  Order received to place foley. Immediate return of 350 cc urine with foley placement

## 2023-08-18 NOTE — Progress Notes (Signed)
Bladder Scanned Patient, 355 in bladder, Cheryl O,NP Notified.  Marland Kitchen

## 2023-08-18 NOTE — TOC Progression Note (Signed)
Transition of Care [facility]) - Progression Note    Patient Details  Name: [name] MRN: [id number] Date of Birth: [birth year]  [facility]) CM/SW Contact  Truddie Hidden, RN Phone Number: 08/18/2023, 10:21 AM  Clinical Narrative:    Spoke with patient's spouse regarding LTACH. He would like to speak with a representative before making a decision. He was provided with [facility] information. Patient spouse chose [facility]. Message sent to [place] from [facility].     Expected Discharge Plan: Skilled Nursing Facility Barriers to Discharge: SNF Pending bed offer, Insurance Authorization  Expected Discharge Plan and Services   Discharge Planning Services: CM Consult   Living arrangements for the past 2 months: Single Family Home                   DME Agency: NA       HH Arranged: NA           Social Determinants of Health (SDOH) Interventions SDOH Screenings   Food Insecurity: No Food Insecurity (07/28/2023)  Housing: Low Risk  (07/28/2023)  Transportation Needs: No Transportation Needs (07/28/2023)  Utilities: Not At Risk (07/28/2023)  Depression (PHQ2-9): Low Risk  (06/15/2023)  Social Connections: Moderately Integrated (07/28/2023)  Tobacco Use: Low Risk  (07/28/2023)    Readmission Risk Interventions     No data to display

## 2023-08-18 NOTE — Progress Notes (Signed)
Neurosurgery Progress Note  History: [name] is here for C2 dome laminectomy, C3-6 laminoplasty and now s/p evacuation hematoma  POD20/17: NAEO POD19/16: pt had trach placed this morning   POD16/13: Pt reportedly moving upper extremities per CC this morning  POD15/12: Pt more interactive per family at bedside and CC team POD14/11: PEG placed yesterday. Pt with worsening anemia. Neurologically unchanged  POD13/10: MRI this morning does not appear to be consistent with spinal cord infarct.  Patient's neurologic exam is unchanged. POD12/9: Pt had neuro change early this morning prompting CT head and C spine.  POD 11/7: Was extubated this morning prior to evaluation.  Was able to track with eyes but was quite somnolent still.  Was reacting to painful stimuli in the bilateral lower extremities.  Left upper extremity was reacting to painful stimuli as well. POD 10/6: Pt experiencing increased work of breathing this morning and worsening findings on chest xray resulting in reintubation this morning. She is currently undergoing bronchoscopy by Dr. Karna Christmas  POD 9/5: Pt more alert this morning asking what day it is  POD 8/4: Very drowsy but arouses this morning to voice.  POD 7/3: Pt is very drowsy, but able to follow some commands  POD6/2: Pt is drowsy this morning asking "what do you want" when awaken  Interval POD5: [name] had a decline yesterday with her left upper extremity and then bilateral lower extremities.  CT scan was unremarkable MRI did show a large epidural hematoma.  She was taken emergently to the OR by Dr. Katrinka Blazing for decompression.  She has been monitored in the ICU overnight. She is still intubated and on sedation. She is on pressors to maintain [facility]   Hospital Course POD4: [name] is more awake today. She states she isnt having a lot of pain but she is thirsty.  POD3:Patient more alert this AM. However; hypoxia, tachycardia, SOB, low energy.  POD2: Very lethargic  and difficult to arouse, states in quite a bit of pain. Medicine had been held due to sleepiness.    Physical Exam: Vitals:   08/18/23 0800 08/18/23 0836  BP:    Pulse:    Resp:    Temp: (!) 97.5 F (36.4 C)   SpO2:  100%    Trach in place  Follows commands x 4. Spontaneous movement in RUE as well  Data:  Output by Drain (mL) 08/16/23 0701 - 08/16/23 1900 08/16/23 1901 - 08/17/23 0700 08/17/23 0701 - 08/17/23 1900 08/17/23 1901 - 08/18/23 0700 08/18/23 0701 - 08/18/23 0858  Requested LDAs do not have output data documented.     Assessment/Plan:  [name]  s/p C2 dome laminectomy, C3-6 laminoplasty  On 1/21 with return to OR on 1/25 for cervical/thoracic laminectomy and epidural hematoma evacuation.  She has had a complicated postoperative course with a spontaneous cervical thoracic hemorrhage on 125 which was taken emergently for decompression.  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  - MRI of neuro axis 08/10/23 showing diffuse central spinal cord swelling concerning for spinal cord infarct.  DWI series repeated 2/4  which did not show restricted diffusion.  - pain control continue with IV and oral medications - HV removed on 1/29 - EEG showing moderate to severe encephalopathy  - trach placed by ENT on 2/10.  - staples removed 2/10.  - CC and IM assisting with non surgical management. We appreciate  your assistance.   Manning Charity PA-C Department of Neurosurgery

## 2023-08-18 NOTE — Inpatient Diabetes Management (Signed)
Inpatient Diabetes Program Recommendations  AACE/ADA: New Consensus Statement on Inpatient Glycemic Control (2015)  Target Ranges:  Prepandial:   less than 140 mg/dL      Peak postprandial:   less than 180 mg/dL (1-2 hours)      Critically ill patients:  140 - 180 mg/dL   Lab Results  Component Value Date   GLUCAP 230 (H) 08/18/2023   HGBA1C 5.9 (H) 04/13/2023    Latest Reference Range & Units 08/17/23 07:24 08/17/23 11:28 08/17/23 15:47 08/17/23 19:16 08/17/23 23:08 08/18/23 04:05 08/18/23 07:36 08/18/23 11:51 08/18/23 12:12  Glucose-Capillary 70 - 99 mg/dL 295 (H) 621 (H) 308 (H) 248 (H) 272 (H) 218 (H) 181 (H) 243 (H) 230 (H)  (H): Data is abnormally high  Current orders for Inpatient glycemic control:  Semglee 5 units daily, Novolog 0-15 units Q4H  Inpatient Diabetes Program Recommendations:   Please consider if CBGs consistently > 180, -Add Novolog 2 units q 4 hrs.  Thank you, Billy Fischer. Neven Fina, RN, MSN, CDCES  Diabetes Coordinator Inpatient Glycemic Control Team Team Pager [phone] (8am-5pm) 08/18/2023 12:44 PM

## 2023-08-18 NOTE — Progress Notes (Signed)
NAME:  [name], MRN:  [id number], DOB:  [birth year], LOS: 21 ADMISSION DATE:  07/28/2023 History of Present Illness:  [name] is a 68 year old old female with PMHx of hypothyroidism, insulin-dependent DM, CKD stage III, dysphagia with esophageal strictures, facial cellulitis, GERD, neuropathy, arthritis, blood dyscrasia, and recent traumatic cervical central cord injury who presented to the hospital for elective cervical spine surgery.   Per patient's chart, patient was seen by neurosurgery on 07/28/2023 for follow-up after suffering a fall 3 months ago in which she sustained a cervical spinal cord injury.  Per patient, they deferred emergent surgical depression and wanted to let her have time to recover and work with physical therapy.  She continued to have hand weakness as well as lower extremity weakness and ambulatory issues.  Given the ongoing compression of C3-C6, laminoplasty and C2 dome laminectomy was indicated.     Hospital course: Patient underwent C3-6 laminoplasty and C2 dome and laminectomy surgery on 07/29/2023 with drain placement.   Pertinent Labs/Diagnostics Findings: Na+/ K+: 141/4.4 Glucose: 136 BUN/Cr.47/1.42 WBC: 5.8 K/L  Hgb/Hct: 7.4/22.4  Pertinent  Medical History  Hypothyroidism, insulin-dependent DM, CKD stage III, dysphagia with esophageal strictures, facial cellulitis, GERD, neuropathy, arthritis, blood dyscrasia, and recent traumatic cervical central cord injury    Significant Hospital Events: Including procedures, antibiotic start and stop dates in addition to other pertinent events   01/21: Patient underwent posterior C3-6 Cervical Laminoplasty with C2 inferior dome Laminectomy 01/22: POD #1. Pt. stable with no acute changes.  Working with PT and OT 01/23: POD #2.  Pt. noted to be very lethargic and difficult to arouse 01/24: POD #3. Pt. developed hypoxia tachycardia and shortness of breath.  Hospitalist consulted CT chest obtained and showed possible  pneumonia.  Started on IV antibiotics and DuoNebs. 01/25: POD #4. Pt. with new concerns of not moving the left arm.  CT head and cervical spine obtained with no new concerns.  Follow-up MRI however demonstrated epidural hematoma extending from the cervical spine to the lower thoracic spine with acute compression and T2 signal.  Taken emergently to the OR for surgical decompression 01/26: Pt. transferred to the ICU s/p emergent cervical decompression and evacuation of epidural hematoma.  Remained intubated. PCCM consulted 08/03/23- s/p surgery overnight, alert to self but drowsy with mild AKI and metabolic acidosis.  Lethargy noted have dcd cefepime and started doxycycline instead.  08/04/23- patient more alert able to wiggle toes to verbal.  Remains critically ill.  08/05/23- patient appears to have hypoactive delerium with waxing and waning sensorium.  At times she asks for things other times she's poorly responsive.  Her oxygenation has improved with nasal canula 5L/min.  S/p heme/onc evaluation today 08/06/23- patient seen at bedside, she was able to whisper few words tome.  I discussed PO intake with speech pathologist and SLP with barium was ordered however patient unable to complete this study. She is still very weak physically barely able to wiggle toes.  H/h <7 s/p prbc.  Renal function is improved.  08/07/23- patient had worsening hypoxemia with CXR showing worsening complete left opacification. I discussed this with neurosurgery and they stated her cervical spine did not have any new hardware so intubation should not be complicated. I met with PA Manning Charity at bedside  to review care plan. We discussed these findings with husband and proceeded with intubation and bronchoscopy.  The entire left lung appeared completely full of mucus impaction in both upper and lower lobes during bronchoscopy.  CXR post  bronchoscopy is in process.  08/08/23- patient is extubated and is able to speak few words with  family.  She moved her feet and right hand today.  I met with husband and son at bedside reviewed imaging and bloodwork. 08/09/23- patient had severe event overnight with hypoxemia , CXR This am with complete opacification of left lung again.  She went into respiratory distress overnight with severe hypoxemia.  She was emergently intubated overnight. WE notified familly and discussed events and imaging findings. She is s/p bronch with improved CXR post procedure.  08/10/23 - remains weak and minimally responsive, flaccid upper and lower extremities 08/11/23 - MRI performed overnight. Remains minimally responsive but does open eyes and track. PEG tube placed by surgery 08/12/23 - acute drop in hemoglobin. Mental status unchanged 08/13/23 - non-responsive. Remains weak, but moving upper extremity non-purposefully 08/14/23 - opens eyes, moves arms non-purposefully 08/15/23 - opens eyes, moving upper extremities, doesn't follow commands 08/16/23 - off neo, on vaso. Eyes open, moving upper and lower extremities non-purposefully 08/17/23 - s/p Trach placement. Doing well remains on the ventilator.    Interim History / Subjective:  Awake and alert this AM, following commands. Will trial switching to PSV.   Objective   Blood pressure (!) 106/49, pulse (!) 57, temperature (!) 97.5 F (36.4 C), temperature source Oral, resp. rate (!) 21, height 5\' 7"  (1.702 m), weight 67.4 kg, SpO2 96%.    Vent Mode: PRVC FiO2 (%):  [40 %] 40 % Set Rate:  [16 bmp] 16 bmp Vt Set:  [380 mL] 380 mL PEEP:  [8 cmH20] 8 cmH20 Plateau Pressure:  [15 cmH20-24 cmH20] 24 cmH20   Intake/Output Summary (Last 24 hours) at 08/18/2023 0945 Last data filed at 08/18/2023 0549 Gross per 24 hour  Intake --  Output 1250 ml  Net -1250 ml   Filed Weights   08/16/23 0500 08/17/23 0432 08/18/23 0500  Weight: 68.1 kg 67.4 kg 67.4 kg    Examination: General: Awake and alert. Following commands.  HENT: s/p Trach tube placement. Shiley size 6.   Lungs: Coarse breath sounds bilaterally.  Cardiovascular: Normal S1, Normal S2, RRR Abdomen: Soft, non tender, non distended, +BS.  Extremities: Warm and well perfused.   Labs and imaging were reviewed.   Assessment & Plan:  [name] is a 68 year old old female with PMHx of hypothyroidism, insulin-dependent DM, CKD stage III, dysphagia with esophageal strictures, facial cellulitis, GERD, neuropathy, arthritis, blood dyscrasia, and recent traumatic cervical central cord injury who presented to the hospital for elective cervical spine surgery. S/p C2 dome laminectomy on 01/21. Course c/p by epidural hematoma s/p evacuation and cervical and thoracic laminectomy on 01/25.  Courser further comlicated by hypoxic respiratory failure and inability to protect the her airways now s/p Trach on 02/10. S/p PEG tube placement 02/04.   #Acute hypoxic respiratory failure s/p trach placement 02/10 secondary to... #Aspiration pneumonia and inability to protect airways in the setting of...  #C2 dome laminectomy 01/21 c/n epidural hematoma s/p decompression and cervical and thoracic laminectomy on 01/25.  #c/b Autonomic dysfunction now on minimal dose vasopressin  #Anemia with Hgb downtrending now at 8.8mg /dl #AKI on CKD stage IIIb likely in the setting of pre renal azotemia Cr improving now at 1.5mg /dl.   Neuro: Delirium precautions.  CVS: Restart home Midodrine and fludrocortisone. on vasopressin wean off to target MAP > 65.   Lungs: Wean down vent support for SpO2 > 90%. Trial PSV and wean as tolerated.  GI: cw TF. Cordelia Poche  PRN. Ensure bowel movement. PPI for prophylaxis.  Endo: POC 140-180. C/w home levothyroxine. Heme: Restart chemical DVT prophylaxis. Renal: Avoid nephrotox agents.   Dispo: Will start LTACH referral in the am.   Best Practice (right click and "Reselect all SmartList Selections" daily)   Diet/type: tubefeeds DVT prophylaxis SCD Pressure ulcer(s): N/A GI prophylaxis: PPI Lines:  Central line Foley:  removal ordered  Code Status:  full code  Last date of multidisciplinary goals of care discussion [08/17/2023]  I spent 50 minutes caring for this patient today, including preparing to see the patient, obtaining a medical history , reviewing a separately obtained history, performing a medically appropriate examination and/or evaluation, ordering medications, tests, or procedures, documenting clinical information in the electronic health record, and independently interpreting results (not separately reported/billed) and communicating results to the patient/family/caregiver  Janann Colonel, MD [facility] 08/18/2023 9:45 AM

## 2023-08-18 NOTE — Evaluation (Signed)
Physical Therapy Re-Evaluation Patient Details Name: [name] MRN: [id number] DOB: 11-27-1955 Today's Date: 08/18/2023  History of Present Illness  Pt is a 68 y/o F admitted on 07/28/23 for elective C2 dome laminectomy, C3-6 laminoplasty. MRI on 08/01/23 showed epidural hematoma extending from the cervical spine to the lower thoracic spine with acute compression & T2 signal. Pt taken emergently to the OR for surgical decompression. Pt is s/p PEG placement 08/11/23, trach placement 08/17/23. PMH: hypothyroidism, IDDM, CKD 3, dysphagia with esophageal strictures, facial cellulitis, GERD, neuropathy, arthritis, blood dyscrasia, recent traumatic cervical central cord injury  Clinical Impression  Pt seen for PT re-evaluation with pt received in bed.  Adjusted HOB for more upright sitting to increase alertness, utilized cold wet cloth on face with pt opening eyes ~50% of the time. Pt with impaired ability to follow commands. Pt presenting with global weakness but L UE/LE appearing more weak than R side. Will continue to follow pt acutely in attempt to increase pt's alertness, focus on ROM, strengthening, & bed mobility.      If plan is discharge home, recommend the following: Assistance with cooking/housework;Direct supervision/assist for medications management;Direct supervision/assist for financial management;Assist for transportation;Help with stairs or ramp for entrance;Supervision due to cognitive status;Two people to help with walking and/or transfers;Two people to help with bathing/dressing/bathroom   Can travel by private vehicle   No    Equipment Recommendations None recommended by PT  Recommendations for Other Services       Functional Status Assessment Patient has had a recent decline in their functional status and demonstrates the ability to make significant improvements in function in a reasonable and predictable amount of time.     Precautions / Restrictions  Precautions Precautions: Cervical;Fall Restrictions Weight Bearing Restrictions Per Provider Order: No      Mobility  Bed Mobility                    Transfers                        Ambulation/Gait                  Stairs            Wheelchair Mobility     Tilt Bed    Modified Rankin (Stroke Patients Only)       Balance                                             Pertinent Vitals/Pain Pain Assessment Pain Assessment: PAINAD Breathing: normal Negative Vocalization: none Facial Expression: smiling or inexpressive Body Language: relaxed Consolability: no need to console PAINAD Score: 0    Home Living Family/patient expects to be discharged to:: Private residence Living Arrangements: Spouse/significant other (husband & disabled adult son) Available Help at Discharge: Family Type of Home: Apartment Home Access: Level entry       Home Layout: One level Home Equipment: Wheelchair - Forensic psychologist (2 wheels);Other (comment) Additional Comments: adjustable bed with new rail to prevent falling out of bed again (all information obtained from previous entry)    Prior Function Prior Level of Function :  (all information taken from previous entry)             Mobility Comments: since home from CIR, has been AMB short distnaces without device in past  couple weeks, inititally required RW; chronic orthostatic hypotension on midodrine ADLs Comments: , Pt reports independent with all ADL/IADL prior to hospital admission.     Extremity/Trunk Assessment   Upper Extremity Assessment Upper Extremity Assessment: Defer to OT evaluation RUE Deficits / Details: 2-/5 shoulder and bicep with flickers 1/5 noted in digits. (per OT assessment) LUE Deficits / Details: no appreciable movement noticed 0/5 grossly (per OT assessment)    Lower Extremity Assessment Lower Extremity Assessment: RLE deficits/detail;LLE  deficits/detail RLE Deficits / Details: withdraws to pain, pt able to move toes but no active movement noted in LLE otherwise, PT able to perform LLE PROM LLE Deficits / Details: pt able to move toes but no active movement noted in LLE otherwise, PT able to perform LLE PROM       Communication   Communication Communication: Impaired Factors Affecting Communication: Trach/intubated    Cognition Arousal: Lethargic Behavior During Therapy: Flat affect   PT - Cognitive impairments: Orientation, Awareness, Memory, Attention, Initiation, Sequencing, Problem solving, Safety/Judgement                       PT - Cognition Comments: Impaired 2/2 lower level cognitive deficits, impaired ability to communicate 2/2 trach & vent. Following commands: Impaired Following commands impaired: Follows one step commands inconsistently     Cueing Cueing Techniques: Verbal cues, Gestural cues, Tactile cues, Visual cues     General Comments      Exercises     Assessment/Plan    PT Assessment Patient needs continued PT services  PT Problem List Decreased strength;Decreased range of motion;Decreased activity tolerance;Decreased balance;Decreased mobility;Decreased coordination;Decreased knowledge of use of DME;Decreased safety awareness;Decreased knowledge of precautions;Cardiopulmonary status limiting activity;Decreased cognition;Impaired sensation;Decreased skin integrity;Pain       PT Treatment Interventions DME instruction;Balance training;Modalities;Gait training;Neuromuscular re-education;Cognitive remediation;Stair training;Functional mobility training;Therapeutic activities;Therapeutic exercise;Manual techniques;Wheelchair mobility training;Patient/family education    PT Goals (Current goals can be found in the Care Plan section)  Acute Rehab PT Goals PT Goal Formulation: Patient unable to participate in goal setting Time For Goal Achievement: 09/01/23 Potential to Achieve Goals:  Fair    Frequency Min 1X/week     Co-evaluation PT/OT/SLP Co-Evaluation/Treatment: Yes Reason for Co-Treatment: Complexity of the patient's impairments (multi-system involvement);Necessary to address cognition/behavior during functional activity;For patient/therapist safety PT goals addressed during session: Strengthening/ROM OT goals addressed during session: ADL's and self-care       AM-PAC PT "6 Clicks" Mobility  Outcome Measure Help needed turning from your back to your side while in a flat bed without using bedrails?: Total Help needed moving from lying on your back to sitting on the side of a flat bed without using bedrails?: Total Help needed moving to and from a bed to a chair (including a wheelchair)?: Total Help needed standing up from a chair using your arms (e.g., wheelchair or bedside chair)?: Total Help needed to walk in hospital room?: Total Help needed climbing 3-5 steps with a railing? : Total 6 Click Score: 6    End of Session   Activity Tolerance: Patient limited by fatigue;Patient tolerated treatment well Patient left: in bed;with call bell/phone within reach;with bed alarm set;with nursing/sitter in room Nurse Communication: Mobility status PT Visit Diagnosis: Unsteadiness on feet (R26.81);Difficulty in walking, not elsewhere classified (R26.2);Other abnormalities of gait and mobility (R26.89);Other symptoms and signs involving the nervous system (R29.898);Muscle weakness (generalized) (M62.81)    Time: 1610-9604 PT Time Calculation (min) (ACUTE ONLY): 13 min   Charges:   PT  Evaluation $PT Re-evaluation: 1 Re-eval   PT General Charges $$ ACUTE PT VISIT: 1 Visit         Aleda Grana, PT, DPT 08/18/23, 3:31 PM   Sandi Mariscal 08/18/2023, 3:29 PM

## 2023-08-18 NOTE — Anesthesia Postprocedure Evaluation (Signed)
Anesthesia Post Note  Patient: [name]  Procedure(s) Performed: TRACHEOSTOMY  Patient location during evaluation: ICU Anesthesia Type: General Level of consciousness: patient remains intubated per anesthesia plan Vital Signs Assessment: post-procedure vital signs reviewed and stable Anesthetic complications: no   No notable events documented.   Last Vitals:  Vitals:   08/18/23 0551 08/18/23 0600  BP:  (!) 94/47  Pulse: (!) 58 (!) 56  Resp: 20 (!) 29  Temp:    SpO2: 96% 95%    Last Pain:  Vitals:   08/18/23 0710  TempSrc:   PainSc: 0-No pain                 Jaye Beagle

## 2023-08-18 NOTE — Progress Notes (Signed)
Daily Progress Note   Patient Name: [name]       Date: 08/18/2023 DOB: May 28, 1956  Age: 68 y.o. MRN#: [id number] Attending Physician: [name], MD Primary Care Physician: Jerl Mina, MD Admit Date: 07/28/2023  Reason for Consultation/Follow-up: Establishing goals of care  Subjective: Notes and labs reviewed.  In to see patient.  She is currently resting in bed with nursing present at bedside.  No family at bedside.    Nursing discusses changing IVs and inserting a foley catheter as patient is retaining urine.  Patient is alert.  Asked patient how she is doing and she nodded her head.  Attempted to explore her thoughts on care.  She shrugged her shoulders and mouth the words " I don't know".  Encouraged her to share her wishes and feelings when she is ready to.   Length of Stay: 21  Current Medications: Scheduled Meds:   Chlorhexidine Gluconate Cloth  6 each Topical Daily   feeding supplement (PROSource TF20)  60 mL Per Tube Daily   fludrocortisone  0.2 mg Oral Daily   FLUoxetine  20 mg Per Tube Daily   free water  100 mL Per Tube Q6H   gabapentin  100 mg Per Tube Q12H   heparin injection (subcutaneous)  5,000 Units Subcutaneous Q8H   insulin aspart  0-15 Units Subcutaneous Q4H   insulin glargine-yfgn  5 Units Subcutaneous Daily   levothyroxine  88 mcg Per Tube QAC breakfast   midodrine  10 mg Per Tube TID WC   nutrition supplement (JUVEN)  1 packet Per Tube BID BM   mouth rinse  15 mL Mouth Rinse Q2H   pantoprazole (PROTONIX) IV  40 mg Intravenous Q12H   sodium chloride flush  10-40 mL Intracatheter Q12H   sodium chloride flush  3-10 mL Intravenous Q12H   sodium chloride flush  3-10 mL Intravenous Q12H    Continuous Infusions:  feeding supplement (OSMOLITE  1.5 CAL) 1,000 mL (08/17/23 1100)    PRN Meds: acetaminophen, bisacodyl, docusate, fentaNYL (SUBLIMAZE) injection, menthol-cetylpyridinium **OR** [DISCONTINUED] phenol, mouth rinse, polyethylene glycol, sodium chloride flush, sodium chloride flush, sodium chloride flush  Physical Exam Pulmonary:     Comments: On ventilator via trach Neurological:     Mental Status: She is alert.  Vital Signs: BP (!) 107/45   Pulse (!) 59   Temp 97.7 F (36.5 C) (Oral)   Resp (!) 26   Ht 5\' 7"  (1.702 m)   Wt 67.4 kg   SpO2 94%   BMI 23.27 kg/m  SpO2: SpO2: 94 % O2 Device: O2 Device: Ventilator O2 Flow Rate: O2 Flow Rate (L/min): 45 L/min  Intake/output summary:  Intake/Output Summary (Last 24 hours) at 08/18/2023 1231 Last data filed at 08/18/2023 0549 Gross per 24 hour  Intake --  Output 1250 ml  Net -1250 ml   LBM: Last BM Date : 08/17/23 Baseline Weight: Weight: 58.9 kg Most recent weight: Weight: 67.4 kg        Patient Active Problem List   Diagnosis Date Noted   Toxic metabolic encephalopathy 08/12/2023   Myelopathy of thoracic region 08/04/2023   Hematoma 08/04/2023   Spinal stenosis, thoracic 08/04/2023   Malnutrition of moderate degree 08/03/2023   Lobar pneumonia (HCC) 08/01/2023   MRSA (methicillin resistant staph aureus) culture positive 08/01/2023   Cervical myelopathy (HCC) 07/28/2023   Spinal stenosis in cervical region 06/18/2023   Spinal cord injury at C5-C7 level without injury of spinal bone (HCC) 06/18/2023   Arm weakness 06/18/2023   Numbness and tingling 06/18/2023   Spasticity 06/15/2023   Orthostatic hypotension 06/15/2023   Esophageal stricture 05/01/2023   Dysphagia 04/30/2023   Odynophagia 04/30/2023   Gastroesophageal reflux disease 04/30/2023   Acute incomplete quadriplegia (HCC) 04/23/2023   Spinal cord injury, cervical region (HCC) 04/22/2023   Spinal cord compression, post-traumatic (HCC) 04/13/2023   Central cord syndrome (HCC)  04/13/2023   Muscle weakness of right upper extremity 04/13/2023   Weakness of right lower extremity 04/13/2023   Pelvic fracture (HCC) 04/13/2023   Cellulitis 08/12/2021   Facial cellulitis 08/11/2021   Dental caries 08/11/2021   COVID-19 virus infection 08/11/2021   Diabetes mellitus without complication (HCC)    Hypothyroidism    Stage 3a chronic kidney disease (HCC)    Non-dose-related adverse reaction to medication    Post-operative state 03/16/2017   AKI (acute kidney injury) (HCC) 01/18/2017   Acute hypoxic respiratory failure (HCC) 11/04/2015    Palliative Care Assessment & Plan     Recommendations/Plan: Continue full code/ full scope.  TOC is working on placement.  Code Status:    Code Status Orders  (From admission, onward)           Start     Ordered   07/28/23 1459  Full code  Continuous       Question:  By:  Answer:  Procedural case: previous code status reviewed   07/28/23 1458           Code Status History     Date Active Date Inactive Code Status Order ID Comments User Context   04/22/2023 1239 05/12/2023 1531 Full Code [id number]  Milinda Antis, PA-C Inpatient   04/22/2023 1239 04/22/2023 1239 Full Code [id number]  Milinda Antis, PA-C Inpatient   04/13/2023 1640 04/22/2023 1237 Full Code [id number]  Lilia Pro, MD ED   04/13/2023 1629 04/13/2023 1640 Full Code [id number]  Erin Fulling, MD ED   08/11/2021 2222 08/16/2021 1950 Full Code [id number]  Andris Baumann, MD ED   03/16/2017 1059 03/17/2017 1935 Full Code [id number]  Schermerhorn, Ihor Austin, MD Inpatient   01/18/2017 1745 01/19/2017 2033 Full Code [id number]  Enedina Finner, MD ED   11/04/2015 1219 11/06/2015 2047 Full Code [id number]  Katha Hamming, MD ED  Thank you for allowing the Palliative Medicine Team to assist in the care of this patient.    Morton Stall, NP  Please contact Palliative Medicine Team phone at [phone] for questions and concerns.

## 2023-08-18 NOTE — Progress Notes (Signed)
 [facility]  ROUNDING NOTE   Subjective:   Critically ill in ICU Eyes open to name Vent support 40% No sedation or pressors Tube feeds at 81ml/hr  02/10 0701 - 02/11 0700 In: 511 [I.V.:200; Blood:311] Out: 1290 [Urine:1250; Stool:35; Blood:5] Lab Results  Component Value Date   CREATININE 1.50 (H) 08/18/2023   CREATININE 1.58 (H) 08/18/2023   CREATININE 1.55 (H) 08/17/2023      Objective:  Vital signs in last 24 hours:  Temp:  [97.4 F (36.3 C)-97.7 F (36.5 C)] 97.7 F (36.5 C) (02/11 1200) Pulse Rate:  [47-64] 59 (02/11 1100) Resp:  [16-30] 26 (02/11 1100) BP: (87-121)/(42-59) 107/45 (02/11 1100) SpO2:  [92 %-100 %] 94 % (02/11 1100) FiO2 (%):  [40 %] 40 % (02/11 0957) Weight:  [67.4 kg] 67.4 kg (02/11 0500)  Weight change: 0 kg Filed Weights   08/16/23 0500 08/17/23 0432 08/18/23 0500  Weight: 68.1 kg 67.4 kg 67.4 kg    Intake/Output: I/O last 3 completed shifts: In: 1757 [I.V.:822; Blood:621; NG/GT:314] Out: 2290 [Urine:2250; Stool:35; Blood:5]   Intake/Output this shift:  No intake/output data recorded.  Physical Exam: General: Critically ill  Head: Atraumatic   Neck: Trach placed on 08/17/23  Lungs:  PRVC FiO2 40%  Heart: regular  Abdomen:  Soft, nontender, bowel sounds present  Extremities: no peripheral edema.  Neurologic: Intubated  Skin: No acute rash  Access: No hemodialysis access    Basic Metabolic Panel: Recent Labs  Lab 08/12/23 0405 08/13/23 0440 08/14/23 0411 08/15/23 0429 08/16/23 0330 08/17/23 0445 08/18/23 0452 08/18/23 0841  NA 140 144 148* 148* 147* 141  --  142  K 4.6 3.9 3.1* 4.6 4.9 4.8  --  4.5  CL 114* 111 111 112* 112* 107  --  108  CO2 16* 22 26 26 24 25   --  23  GLUCOSE 253* 166* 156* 274* 241* 325*  --  201*  BUN 105* 97* 90* 83* 82* 73*  --  80*  CREATININE 2.05* 1.77* 1.74* 1.68* 1.66* 1.55* 1.58* 1.50*  CALCIUM 8.4* 8.1* 7.8* 7.8* 8.0* 7.6*  --  7.2*  MG 2.3 2.2 2.3 2.2  --   --  2.1  --    PHOS 3.5 4.0 2.9 2.6 1.7* 2.5 3.6  --     Liver Function Tests: Recent Labs  Lab 08/13/23 0440 08/14/23 0411 08/15/23 0429 08/16/23 0330 08/17/23 0445  ALBUMIN 2.9* 2.8* 2.8* 2.9* 2.6*   No results for input(s): "LIPASE", "AMYLASE" in the last 168 hours. No results for input(s): "AMMONIA" in the last 168 hours.  CBC: Recent Labs  Lab 08/12/23 1758 08/13/23 0440 08/13/23 1618 08/14/23 0411 08/15/23 0429 08/16/23 0330 08/17/23 0445 08/17/23 1536 08/18/23 0452  WBC 14.1*   < > 8.7   < > 8.0 7.2 5.6 5.7 5.8  NEUTROABS 10.8*  --  6.8  --   --   --   --   --   --   HGB 8.2*   < > 6.5*   < > 9.9* 9.7* 8.8* 8.5* 9.5*  HCT 23.6*   < > 18.9*   < > 30.3* 30.2* 27.9* 27.0* 30.6*  MCV 87.4   < > 88.7   < > 92.7 94.1 95.5 97.5 98.4  PLT 119*   < > 98*   < > 108* 107* 94* 100* 107*   < > = values in this interval not displayed.    Cardiac Enzymes: No results for input(s): "CKTOTAL", "CKMB", "  CKMBINDEX", "TROPONINI" in the last 168 hours.  BNP: Invalid input(s): "POCBNP"  CBG: Recent Labs  Lab 08/17/23 2308 08/18/23 0405 08/18/23 0736 08/18/23 1151 08/18/23 1212  GLUCAP 272* 218* 181* 243* 230*    Microbiology: Results for orders placed or performed during the hospital encounter of 07/28/23  MRSA Next Gen by PCR, Nasal     Status: Abnormal   Collection Time: 08/01/23  5:00 AM   Specimen: Nasal Mucosa; Nasal Swab  Result Value Ref Range Status   MRSA by PCR Next Gen DETECTED (A) NOT DETECTED Final    Comment: RESULT CALLED TO, READ BACK BY AND VERIFIED WITH: JOAN WILLIS @0923  08/01/23 MJU (NOTE) The GeneXpert MRSA Assay (FDA approved for NASAL specimens only), is one component of a comprehensive MRSA colonization surveillance program. It is not intended to diagnose MRSA infection nor to guide or monitor treatment for MRSA infections. Test performance is not FDA approved in patients less than 38 years old. Performed at [facility], [address]  [place], [place] [address]   Culture, BAL-quantitative w Gram Stain     Status: Abnormal   Collection Time: 08/07/23 11:57 AM   Specimen: Bronchoalveolar Lavage; Respiratory  Result Value Ref Range Status   Specimen Description   Final    BRONCHIAL ALVEOLAR LAVAGE Performed at [facility], [address], [place], [place] [address]    Special Requests   Final    NONE Performed at [facility], [address] [place], [place] [address]    Gram Stain   Final    MODERATE WBC PRESENT,BOTH PMN AND MONONUCLEAR FEW YEAST Performed at [facility], [address] [place], [place] [address]    Culture 30,000 COLONIES/mL CANDIDA GLABRATA (A)  Final   Report Status 08/10/2023 FINAL  Final  Culture, BAL-quantitative w Gram Stain     Status: Abnormal   Collection Time: 08/09/23 10:36 AM   Specimen: Bronchoalveolar Lavage; Respiratory  Result Value Ref Range Status   Specimen Description   Final    BRONCHIAL ALVEOLAR LAVAGE Performed at [facility], [address] [place], [place] [address]    Special Requests   Final    NONE Performed at [facility], [address] [place], [place] [address]    Gram Stain   Final    MODERATE WBC PRESENT,BOTH PMN AND MONONUCLEAR RARE GRAM POSITIVE COCCI IN PAIRS FEW YEAST Performed at [facility], [address], [place], [place] [address]    Culture 80,000 COLONIES/mL CANDIDA GLABRATA (A)  Final   Report Status 08/12/2023 FINAL  Final  Culture, blood (Routine X 2) w Reflex to ID Panel     Status: None   Collection Time: 08/10/23 11:45 AM   Specimen: BLOOD RIGHT ARM  Result Value Ref Range Status   Specimen Description BLOOD RIGHT ARM  Final   Special Requests   Final    BOTTLES DRAWN AEROBIC AND ANAEROBIC Blood Culture adequate volume   Culture   Final    NO GROWTH 5 DAYS Performed at [facility], [address] [place], [place] [address]    Report Status 08/15/2023 FINAL   Final  Culture, blood (Routine X 2) w Reflex to ID Panel     Status: None   Collection Time: 08/10/23 11:46 AM   Specimen: BLOOD LEFT HAND  Result Value Ref Range Status   Specimen Description BLOOD LEFT HAND  Final   Special Requests   Final  BOTTLES DRAWN AEROBIC AND ANAEROBIC Blood Culture adequate volume   Culture   Final    NO GROWTH 5 DAYS Performed at [facility], [address] [place] [place], [place] [address]    Report Status 08/15/2023 FINAL  Final  Culture, Respiratory w Gram Stain     Status: None (Preliminary result)   Collection Time: 08/15/23 11:12 AM   Specimen: Tracheal Aspirate; Respiratory  Result Value Ref Range Status   Specimen Description   Final    TRACHEAL ASPIRATE Performed at [facility], [address] [place], [place] [address]    Special Requests   Final    NONE Performed at [facility], [address] [place], [place] [address]    Gram Stain   Final    FEW WBC SEEN RARE YEAST Performed at [facility], [address] [place], [place] [address]    Culture RARE CANDIDA ALBICANS  Final   Report Status PENDING  Incomplete    Coagulation Studies: No results for input(s): "LABPROT", "INR" in the last 72 hours.   Urinalysis: No results for input(s): "COLORURINE", "LABSPEC", "PHURINE", "GLUCOSEU", "HGBUR", "BILIRUBINUR", "KETONESUR", "PROTEINUR", "UROBILINOGEN", "NITRITE", "LEUKOCYTESUR" in the last 72 hours.  Invalid input(s): "APPERANCEUR"    Imaging: No results found.    Medications:    feeding supplement (OSMOLITE 1.5 CAL) 1,000 mL (08/17/23 1100)    Chlorhexidine Gluconate Cloth  6 each Topical Daily   feeding supplement (PROSource TF20)  60 mL Per Tube Daily   fludrocortisone  0.2 mg Oral Daily   FLUoxetine  20 mg Per Tube Daily   free water  100 mL Per Tube Q6H   gabapentin  100 mg Per Tube Q12H   heparin injection (subcutaneous)  5,000 Units Subcutaneous Q8H   insulin aspart  0-15 Units  Subcutaneous Q4H   insulin glargine-yfgn  5 Units Subcutaneous Daily   levothyroxine  88 mcg Per Tube QAC breakfast   midodrine  10 mg Per Tube TID WC   nutrition supplement (JUVEN)  1 packet Per Tube BID BM   mouth rinse  15 mL Mouth Rinse Q2H   pantoprazole (PROTONIX) IV  40 mg Intravenous Q12H   sodium chloride flush  10-40 mL Intracatheter Q12H   sodium chloride flush  3-10 mL Intravenous Q12H   sodium chloride flush  3-10 mL Intravenous Q12H   acetaminophen, bisacodyl, docusate, fentaNYL (SUBLIMAZE) injection, menthol-cetylpyridinium **OR** [DISCONTINUED] phenol, mouth rinse, polyethylene glycol, sodium chloride flush, sodium chloride flush, sodium chloride flush  Assessment/ Plan:  68 y.o. female with a PMHx of acute incomplete quadriplegia, history of easy bleeding, central cord syndrome, diabetes mellitus type 2, diverticulosis, GERD, hypothyroidism, peripheral neuropathy, who was admitted to [facility] on 07/28/2023 for C2 dome laminectomy, C3-6 laminoplasty with postoperative bleeding and epidural hematoma causing spinal cord compression s/p evacuation 08/01/2023 who we are asked to see now for acute kidney injury.   1.  Acute kidney injury/chronic kidney disease stage II baseline creatinine 0.75.  Suspect acute kidney injury now related to relative hypotension. 02/10 0701 - 02/11 0700 In: 511 [I.V.:200; Blood:311] Out: 1290 [Urine:1250; Stool:35; Blood:5] Lab Results  Component Value Date   CREATININE 1.50 (H) 08/18/2023   CREATININE 1.58 (H) 08/18/2023   CREATININE 1.55 (H) 08/17/2023  Creatinine stable. Urine output 1.25L Continue supportive care.  2. Hypernatremia: with calculated free water deficit of 0.7L.  - Sodium 142   2.  Anemia of chronic kidney disease.   status post transfusion.  Hemoglobin 9.5, stable.  3.  Acute respiratory failure. Trach placed on 08/17/23. Vent supported.   LOS: 21 [name] 2/11/202512:23 PM

## 2023-08-19 DIAGNOSIS — J690 Pneumonitis due to inhalation of food and vomit: Secondary | ICD-10-CM | POA: Diagnosis not present

## 2023-08-19 DIAGNOSIS — G959 Disease of spinal cord, unspecified: Secondary | ICD-10-CM | POA: Diagnosis not present

## 2023-08-19 DIAGNOSIS — J9601 Acute respiratory failure with hypoxia: Secondary | ICD-10-CM | POA: Diagnosis not present

## 2023-08-19 DIAGNOSIS — Z7189 Other specified counseling: Secondary | ICD-10-CM | POA: Diagnosis not present

## 2023-08-19 LAB — GLUCOSE, CAPILLARY
Glucose-Capillary: 248 mg/dL — ABNORMAL HIGH (ref 70–99)
Glucose-Capillary: 290 mg/dL — ABNORMAL HIGH (ref 70–99)
Glucose-Capillary: 268 mg/dL — ABNORMAL HIGH (ref 70–99)
Glucose-Capillary: 221 mg/dL — ABNORMAL HIGH (ref 70–99)
Glucose-Capillary: 201 mg/dL — ABNORMAL HIGH (ref 70–99)

## 2023-08-19 LAB — CBC
HCT: 29.8 % — ABNORMAL LOW (ref 36.0–46.0)
Hemoglobin: 9.4 g/dL — ABNORMAL LOW (ref 12.0–15.0)
MCH: 30.4 pg (ref 26.0–34.0)
MCHC: 31.5 g/dL (ref 30.0–36.0)
MCV: 96.4 fL (ref 80.0–100.0)
Platelets: 115 10*3/uL — ABNORMAL LOW (ref 150–400)
RBC: 3.09 MIL/uL — ABNORMAL LOW (ref 3.87–5.11)
RDW: 15.9 % — ABNORMAL HIGH (ref 11.5–15.5)
WBC: 5.2 10*3/uL (ref 4.0–10.5)
nRBC: 0 % (ref 0.0–0.2)

## 2023-08-19 MED ORDER — DEXTROSE 50 % IV SOLN: 12.5000 g | INTRAVENOUS | Status: DC

## 2023-08-19 MED ORDER — INSULIN ASPART 100 UNIT/ML IJ SOLN
3.0000 [IU] | Freq: Four times a day (QID) | INTRAMUSCULAR | Status: DC
  Administered 2023-08-19 (×3): 3 [IU] via SUBCUTANEOUS
  Filled 2023-08-19 (×3): qty 1

## 2023-08-19 MED ORDER — INSULIN ASPART 100 UNIT/ML IJ SOLN: 0.0000 [IU] | Freq: Four times a day (QID) | INTRAMUSCULAR | Status: DC

## 2023-08-19 MED ORDER — INSULIN GLARGINE-YFGN 100 UNIT/ML ~~LOC~~ SOLN
10.0000 [IU] | Freq: Once | SUBCUTANEOUS | Status: AC
  Administered 2023-08-19: 10 [IU] via SUBCUTANEOUS
  Filled 2023-08-19: qty 0.1

## 2023-08-19 MED ORDER — INSULIN ASPART 100 UNIT/ML IJ SOLN: 3.0000 [IU] | Freq: Four times a day (QID) | INTRAMUSCULAR | Status: DC

## 2023-08-19 MED ORDER — INSULIN ASPART 100 UNIT/ML IJ SOLN
0.0000 [IU] | Freq: Four times a day (QID) | INTRAMUSCULAR | Status: DC
  Administered 2023-08-19 (×2): 5 [IU] via SUBCUTANEOUS
  Administered 2023-08-19: 8 [IU] via SUBCUTANEOUS
  Administered 2023-08-20 (×3): 3 [IU] via SUBCUTANEOUS
  Administered 2023-08-21 (×2): 5 [IU] via SUBCUTANEOUS
  Administered 2023-08-21: 8 [IU] via SUBCUTANEOUS
  Filled 2023-08-19 (×9): qty 1

## 2023-08-19 MED ORDER — INSULIN GLARGINE-YFGN 100 UNIT/ML ~~LOC~~ SOLN
15.0000 [IU] | Freq: Every day | SUBCUTANEOUS | Status: DC
  Administered 2023-08-20 – 2023-08-21 (×2): 15 [IU] via SUBCUTANEOUS
  Filled 2023-08-19 (×2): qty 0.15

## 2023-08-19 MED ORDER — FUROSEMIDE 10 MG/ML IJ SOLN
20.0000 mg | Freq: Once | INTRAMUSCULAR | Status: AC
  Administered 2023-08-19: 20 mg via INTRAVENOUS
  Filled 2023-08-19: qty 2

## 2023-08-19 NOTE — TOC Progression Note (Signed)
Transition of Care [facility]) - Progression Note    Patient Details  Name: [name] MRN: [id number] Date of Birth: 14-May-1956  Transition of Care [facility]) CM/SW Contact  Truddie Hidden, RN Phone Number: 08/19/2023, 1:47 PM  Clinical Narrative:    Per Marlaine Hind for [facility], authorization started for [facility].     Expected Discharge Plan: Skilled Nursing Facility Barriers to Discharge: SNF Pending bed offer, Insurance Authorization  Expected Discharge Plan and Services   Discharge Planning Services: CM Consult   Living arrangements for the past 2 months: Single Family Home                   DME Agency: NA       HH Arranged: NA           Social Determinants of Health (SDOH) Interventions SDOH Screenings   Food Insecurity: No Food Insecurity (07/28/2023)  Housing: Low Risk  (07/28/2023)  Transportation Needs: No Transportation Needs (07/28/2023)  Utilities: Not At Risk (07/28/2023)  Depression (PHQ2-9): Low Risk  (06/15/2023)  Social Connections: Moderately Integrated (07/28/2023)  Tobacco Use: Low Risk  (07/28/2023)    Readmission Risk Interventions     No data to display

## 2023-08-19 NOTE — Progress Notes (Addendum)
Daily Progress Note   Patient Name: [name]       Date: 08/19/2023 DOB: 12/21/1955  Age: 68 y.o. MRN#: [id number] Attending Physician: Janann Colonel, MD Primary Care Physician: Jerl Mina, MD Admit Date: 07/28/2023  Reason for Consultation/Follow-up: Establishing goals of care  Subjective: Notes and labs reviewed.  In to see patient.  She is currently sitting in bed with her son at bedside.  She is trying to mouth words to speak but there is great difficulty with understanding much of what she is trying to say.  Son also having difficulty with understanding some of what she is trying to say.  She states she wants to go home.  We discussed her current requirement for ventilator support, if she wishes to continue life-prolonging care, and the need for [facility] placement.  Discussed a comfort path.  She nods that she would like to continue ventilator support at this time.  She shakes her head and appears to indicate that she would not want CPR, but given previous responses, would recommend inquiring about this again at another time to confirm this decision, and then discussing this with both her and her husband.  Length of Stay: 22  Current Medications: Scheduled Meds:   Chlorhexidine Gluconate Cloth  6 each Topical Daily   feeding supplement (PROSource TF20)  60 mL Per Tube Daily   fludrocortisone  0.2 mg Oral Daily   FLUoxetine  20 mg Per Tube Daily   free water  100 mL Per Tube Q6H   gabapentin  100 mg Per Tube Q12H   heparin injection (subcutaneous)  5,000 Units Subcutaneous Q8H   insulin aspart  0-15 Units Subcutaneous Q6H   insulin aspart  3 Units Subcutaneous Q6H   [START ON 08/20/2023] insulin glargine-yfgn  15 Units Subcutaneous Daily   levothyroxine  88 mcg Per Tube  QAC breakfast   midodrine  10 mg Per Tube TID WC   nutrition supplement (JUVEN)  1 packet Per Tube BID BM   mouth rinse  15 mL Mouth Rinse Q2H   pantoprazole (PROTONIX) IV  40 mg Intravenous Q12H   sodium chloride flush  10-40 mL Intracatheter Q12H   sodium chloride flush  3-10 mL Intravenous Q12H   sodium chloride flush  3-10 mL Intravenous Q12H    Continuous Infusions:  feeding supplement (  OSMOLITE 1.5 CAL) 60 mL/hr at 08/19/23 0600    PRN Meds: acetaminophen, bisacodyl, docusate, fentaNYL (SUBLIMAZE) injection, menthol-cetylpyridinium **OR** [DISCONTINUED] phenol, mouth rinse, polyethylene glycol, sodium chloride flush, sodium chloride flush, sodium chloride flush  Physical Exam Pulmonary:     Comments: On ventilator Skin:    General: Skin is warm and dry.  Neurological:     Mental Status: She is alert.             Vital Signs: BP (!) 120/49   Pulse 70   Temp 98.2 F (36.8 C) (Oral)   Resp (!) 29   Ht 5\' 7"  (1.702 m)   Wt 70.5 kg   SpO2 95%   BMI 24.34 kg/m  SpO2: SpO2: 95 % O2 Device: O2 Device: Ventilator O2 Flow Rate: O2 Flow Rate (L/min): 45 L/min  Intake/output summary:  Intake/Output Summary (Last 24 hours) at 08/19/2023 1152 Last data filed at 08/19/2023 0600 Gross per 24 hour  Intake 2468.07 ml  Output 2150 ml  Net 318.07 ml   LBM: Last BM Date : 08/18/23 Baseline Weight: Weight: 58.9 kg Most recent weight: Weight: 70.5 kg    Patient Active Problem List   Diagnosis Date Noted   Toxic metabolic encephalopathy 08/12/2023   Myelopathy of thoracic region 08/04/2023   Hematoma 08/04/2023   Spinal stenosis, thoracic 08/04/2023   Malnutrition of moderate degree 08/03/2023   Lobar pneumonia (HCC) 08/01/2023   MRSA (methicillin resistant staph aureus) culture positive 08/01/2023   Cervical myelopathy (HCC) 07/28/2023   Spinal stenosis in cervical region 06/18/2023   Spinal cord injury at C5-C7 level without injury of spinal bone (HCC) 06/18/2023    Arm weakness 06/18/2023   Numbness and tingling 06/18/2023   Spasticity 06/15/2023   Orthostatic hypotension 06/15/2023   Esophageal stricture 05/01/2023   Dysphagia 04/30/2023   Odynophagia 04/30/2023   Gastroesophageal reflux disease 04/30/2023   Acute incomplete quadriplegia (HCC) 04/23/2023   Spinal cord injury, cervical region (HCC) 04/22/2023   Spinal cord compression, post-traumatic (HCC) 04/13/2023   Central cord syndrome (HCC) 04/13/2023   Muscle weakness of right upper extremity 04/13/2023   Weakness of right lower extremity 04/13/2023   Pelvic fracture (HCC) 04/13/2023   Cellulitis 08/12/2021   Facial cellulitis 08/11/2021   Dental caries 08/11/2021   COVID-19 virus infection 08/11/2021   Diabetes mellitus without complication (HCC)    Hypothyroidism    Stage 3a chronic kidney disease (HCC)    Non-dose-related adverse reaction to medication    Post-operative state 03/16/2017   AKI (acute kidney injury) (HCC) 01/18/2017   Acute hypoxic respiratory failure (HCC) 11/04/2015    Palliative Care Assessment & Plan     Recommendations/Plan:  Continue current care  Code Status:    Code Status Orders  (From admission, onward)           Start     Ordered   07/28/23 1459  Full code  Continuous       Question:  By:  Answer:  Procedural case: previous code status reviewed   07/28/23 1458           Code Status History     Date Active Date Inactive Code Status Order ID Comments User Context   04/22/2023 1239 05/12/2023 1531 Full Code [id number]  Carlos Levering Inpatient   04/22/2023 1239 04/22/2023 1239 Full Code [id number]  Carlos Levering Inpatient   04/13/2023 1640 04/22/2023 1237 Full Code [id number]  Lilia Pro, MD ED   04/13/2023 [phone]  04/13/2023 1640 Full Code [id number]  Erin Fulling, MD ED   08/11/2021 2222 08/16/2021 1950 Full Code [id number]  Andris Baumann, MD ED   03/16/2017 1059 03/17/2017 1935 Full Code [id number]  Schermerhorn,  Ihor Austin, MD Inpatient   01/18/2017 1745 01/19/2017 2033 Full Code [id number]  Enedina Finner, MD ED   11/04/2015 1219 11/06/2015 2047 Full Code [id number]  Katha Hamming, MD ED    Thank you for allowing the Palliative Medicine Team to assist in the care of this patient.    Morton Stall, NP  Please contact Palliative Medicine Team phone at [phone] for questions and concerns.

## 2023-08-19 NOTE — Progress Notes (Signed)
[facility]  ROUNDING NOTE   Subjective:   Ill appearing Vent support 30% No sedation or pressors Resting comfortably  Tube feeds at 37ml/hr  02/11 0701 - 02/12 0700 In: 2468.1 [I.V.:48.1; NG/GT:2420] Out: 2150 [Urine:2150] Lab Results  Component Value Date   CREATININE 1.50 (H) 08/18/2023   CREATININE 1.58 (H) 08/18/2023   CREATININE 1.55 (H) 08/17/2023      Objective:  Vital signs in last 24 hours:  Temp:  [97.7 F (36.5 C)-99.6 F (37.6 C)] 98.2 F (36.8 C) (02/12 0400) Pulse Rate:  [49-78] 70 (02/12 0600) Resp:  [20-32] 29 (02/12 0600) BP: (92-142)/(40-56) 120/49 (02/12 0600) SpO2:  [93 %-99 %] 95 % (02/12 0600) FiO2 (%):  [30 %-40 %] 30 % (02/12 0710) Weight:  [70.5 kg] 70.5 kg (02/12 0410)  Weight change: 3.1 kg Filed Weights   08/17/23 0432 08/18/23 0500 08/19/23 0410  Weight: 67.4 kg 67.4 kg 70.5 kg    Intake/Output: I/O last 3 completed shifts: In: 2468.1 [I.V.:48.1; NG/GT:2420] Out: 2850 [Urine:2850]   Intake/Output this shift:  No intake/output data recorded.  Physical Exam: General: Critically ill  Head: Atraumatic   Neck: Trach placed on 08/17/23  Lungs:  PRVC FiO2 30%  Heart: regular  Abdomen:  Soft, nontender, bowel sounds present  Extremities: no peripheral edema.  Neurologic: Alert  Skin: No acute rash  Access: No hemodialysis access    Basic Metabolic Panel: Recent Labs  Lab 08/13/23 0440 08/14/23 0411 08/15/23 0429 08/16/23 0330 08/17/23 0445 08/18/23 0452 08/18/23 0841  NA 144 148* 148* 147* 141  --  142  K 3.9 3.1* 4.6 4.9 4.8  --  4.5  CL 111 111 112* 112* 107  --  108  CO2 22 26 26 24 25   --  23  GLUCOSE 166* 156* 274* 241* 325*  --  201*  BUN 97* 90* 83* 82* 73*  --  80*  CREATININE 1.77* 1.74* 1.68* 1.66* 1.55* 1.58* 1.50*  CALCIUM 8.1* 7.8* 7.8* 8.0* 7.6*  --  7.2*  MG 2.2 2.3 2.2  --   --  2.1  --   PHOS 4.0 2.9 2.6 1.7* 2.5 3.6  --     Liver Function Tests: Recent Labs  Lab 08/13/23 0440  08/14/23 0411 08/15/23 0429 08/16/23 0330 08/17/23 0445  ALBUMIN 2.9* 2.8* 2.8* 2.9* 2.6*   No results for input(s): "LIPASE", "AMYLASE" in the last 168 hours. No results for input(s): "AMMONIA" in the last 168 hours.  CBC: Recent Labs  Lab 08/12/23 1758 08/13/23 0440 08/13/23 1618 08/14/23 0411 08/16/23 0330 08/17/23 0445 08/17/23 1536 08/18/23 0452 08/19/23 0548  WBC 14.1*   < > 8.7   < > 7.2 5.6 5.7 5.8 5.2  NEUTROABS 10.8*  --  6.8  --   --   --   --   --   --   HGB 8.2*   < > 6.5*   < > 9.7* 8.8* 8.5* 9.5* 9.4*  HCT 23.6*   < > 18.9*   < > 30.2* 27.9* 27.0* 30.6* 29.8*  MCV 87.4   < > 88.7   < > 94.1 95.5 97.5 98.4 96.4  PLT 119*   < > 98*   < > 107* 94* 100* 107* 115*   < > = values in this interval not displayed.    Cardiac Enzymes: No results for input(s): "CKTOTAL", "CKMB", "CKMBINDEX", "TROPONINI" in the last 168 hours.  BNP: Invalid input(s): "POCBNP"  CBG: Recent Labs  Lab 08/18/23  1635 08/18/23 1936 08/18/23 2340 08/19/23 0404 08/19/23 0750  GLUCAP 204* 193* 243* 248* 290*    Microbiology: Results for orders placed or performed during the hospital encounter of 07/28/23  MRSA Next Gen by PCR, Nasal     Status: Abnormal   Collection Time: 08/01/23  5:00 AM   Specimen: Nasal Mucosa; Nasal Swab  Result Value Ref Range Status   MRSA by PCR Next Gen DETECTED (A) NOT DETECTED Final    Comment: RESULT CALLED TO, READ BACK BY AND VERIFIED WITH: JOAN WILLIS @0923  08/01/23 MJU (NOTE) The GeneXpert MRSA Assay (FDA approved for NASAL specimens only), is one component of a comprehensive MRSA colonization surveillance program. It is not intended to diagnose MRSA infection nor to guide or monitor treatment for MRSA infections. Test performance is not FDA approved in patients less than 8 years old. Performed at [facility], [address] [place], [place] [address]   Culture, BAL-quantitative w Gram Stain     Status: Abnormal   Collection  Time: 08/07/23 11:57 AM   Specimen: Bronchoalveolar Lavage; Respiratory  Result Value Ref Range Status   Specimen Description   Final    BRONCHIAL ALVEOLAR LAVAGE Performed at [facility], [address] [place], [place] [address]    Special Requests   Final    NONE Performed at [facility], [address] [place], [place] [address]    Gram Stain   Final    MODERATE WBC PRESENT,BOTH PMN AND MONONUCLEAR FEW YEAST Performed at [facility], [address] [place], [place] [address]    Culture 30,000 COLONIES/mL CANDIDA GLABRATA (A)  Final   Report Status 08/10/2023 FINAL  Final  Culture, BAL-quantitative w Gram Stain     Status: Abnormal   Collection Time: 08/09/23 10:36 AM   Specimen: Bronchoalveolar Lavage; Respiratory  Result Value Ref Range Status   Specimen Description   Final    BRONCHIAL ALVEOLAR LAVAGE Performed at [facility], [address] [place], [place] [address]    Special Requests   Final    NONE Performed at [facility], [address] [place], [place] [address]    Gram Stain   Final    MODERATE WBC PRESENT,BOTH PMN AND MONONUCLEAR RARE GRAM POSITIVE COCCI IN PAIRS FEW YEAST Performed at [facility], [address] [place], [place] [address]    Culture 80,000 COLONIES/mL CANDIDA GLABRATA (A)  Final   Report Status 08/12/2023 FINAL  Final  Culture, blood (Routine X 2) w Reflex to ID Panel     Status: None   Collection Time: 08/10/23 11:45 AM   Specimen: BLOOD RIGHT ARM  Result Value Ref Range Status   Specimen Description BLOOD RIGHT ARM  Final   Special Requests   Final    BOTTLES DRAWN AEROBIC AND ANAEROBIC Blood Culture adequate volume   Culture   Final    NO GROWTH 5 DAYS Performed at [facility], [address], [place], [place] [address]    Report Status 08/15/2023 FINAL  Final  Culture, blood (Routine X 2) w Reflex to ID Panel     Status: None   Collection Time: 08/10/23  11:46 AM   Specimen: BLOOD LEFT HAND  Result Value Ref Range Status   Specimen Description BLOOD LEFT HAND  Final   Special Requests   Final    BOTTLES DRAWN AEROBIC AND ANAEROBIC Blood Culture adequate volume   Culture   Final  NO GROWTH 5 DAYS Performed at [facility], [address] [place] [place], [place] [address]    Report Status 08/15/2023 FINAL  Final  Culture, Respiratory w Gram Stain     Status: None   Collection Time: 08/15/23 11:12 AM   Specimen: Tracheal Aspirate; Respiratory  Result Value Ref Range Status   Specimen Description   Final    TRACHEAL ASPIRATE Performed at [facility], [address], [place], [place] [address]    Special Requests   Final    NONE Performed at [facility], [address] [place] [address] [place], [place] [address]    Gram Stain   Final    FEW WBC SEEN RARE YEAST Performed at [facility], [address] [place], [place] [address]    Culture RARE CANDIDA ALBICANS  Final   Report Status 08/18/2023 FINAL  Final    Coagulation Studies: No results for input(s): "LABPROT", "INR" in the last 72 hours.   Urinalysis: No results for input(s): "COLORURINE", "LABSPEC", "PHURINE", "GLUCOSEU", "HGBUR", "BILIRUBINUR", "KETONESUR", "PROTEINUR", "UROBILINOGEN", "NITRITE", "LEUKOCYTESUR" in the last 72 hours.  Invalid input(s): "APPERANCEUR"    Imaging: No results found.    Medications:    feeding supplement (OSMOLITE 1.5 CAL) 60 mL/hr at 08/19/23 0600    Chlorhexidine Gluconate Cloth  6 each Topical Daily   feeding supplement (PROSource TF20)  60 mL Per Tube Daily   fludrocortisone  0.2 mg Oral Daily   FLUoxetine  20 mg Per Tube Daily   free water  100 mL Per Tube Q6H   gabapentin  100 mg Per Tube Q12H   heparin injection (subcutaneous)  5,000 Units Subcutaneous Q8H   insulin aspart  0-15 Units Subcutaneous Q4H   insulin glargine-yfgn  5 Units Subcutaneous Daily   levothyroxine  88 mcg Per Tube QAC breakfast    midodrine  10 mg Per Tube TID WC   nutrition supplement (JUVEN)  1 packet Per Tube BID BM   mouth rinse  15 mL Mouth Rinse Q2H   pantoprazole (PROTONIX) IV  40 mg Intravenous Q12H   sodium chloride flush  10-40 mL Intracatheter Q12H   sodium chloride flush  3-10 mL Intravenous Q12H   sodium chloride flush  3-10 mL Intravenous Q12H   acetaminophen, bisacodyl, docusate, fentaNYL (SUBLIMAZE) injection, menthol-cetylpyridinium **OR** [DISCONTINUED] phenol, mouth rinse, polyethylene glycol, sodium chloride flush, sodium chloride flush, sodium chloride flush  Assessment/ Plan:  68 y.o. female with a PMHx of acute incomplete quadriplegia, history of easy bleeding, central cord syndrome, diabetes mellitus type 2, diverticulosis, GERD, hypothyroidism, peripheral neuropathy, who was admitted to [facility] on 07/28/2023 for C2 dome laminectomy, C3-6 laminoplasty with postoperative bleeding and epidural hematoma causing spinal cord compression s/p evacuation 08/01/2023 who we are asked to see now for acute kidney injury.   1.  Acute kidney injury/chronic kidney disease stage II baseline creatinine 0.75.  Suspect acute kidney injury now related to relative hypotension. 02/11 0701 - 02/12 0700 In: 2468.1 [I.V.:48.1; NG/GT:2420] Out: 2150 [Urine:2150] Lab Results  Component Value Date   CREATININE 1.50 (H) 08/18/2023   CREATININE 1.58 (H) 08/18/2023   CREATININE 1.55 (H) 08/17/2023  Creatinine remains stable. Urine output 2.15L Continue supportive care.  2. Hypernatremia: with calculated free water deficit of 0.7L.  - Sodium 142   2.  Anemia of chronic kidney disease.   status post transfusion.  Hemoglobin 9.5, stable.  3.  Acute respiratory failure. Trach placed on 08/17/23. Vent supported.   LOS: 22 [name] 2/12/202510:17  AM

## 2023-08-19 NOTE — Progress Notes (Signed)
NAME:  [name], MRN:  [id number], DOB:  [birth year], LOS: 22 ADMISSION DATE:  07/28/2023 History of Present Illness:  [name] is a 68 year old old female with PMHx of hypothyroidism, insulin-dependent DM, CKD stage III, dysphagia with esophageal strictures, facial cellulitis, GERD, neuropathy, arthritis, blood dyscrasia, and recent traumatic cervical central cord injury who presented to the hospital for elective cervical spine surgery.   Per patient's chart, patient was seen by neurosurgery on 07/28/2023 for follow-up after suffering a fall 3 months ago in which she sustained a cervical spinal cord injury.  Per patient, they deferred emergent surgical depression and wanted to let her have time to recover and work with physical therapy.  She continued to have hand weakness as well as lower extremity weakness and ambulatory issues.  Given the ongoing compression of C3-C6, laminoplasty and C2 dome laminectomy was indicated.     Hospital course: Patient underwent C3-6 laminoplasty and C2 dome and laminectomy surgery on 07/29/2023 with drain placement.   Pertinent Labs/Diagnostics Findings: Na+/ K+: 141/4.4 Glucose: 136 BUN/Cr.47/1.42 WBC: 5.8 K/L  Hgb/Hct: 7.4/22.4  Pertinent  Medical History  Hypothyroidism, insulin-dependent DM, CKD stage III, dysphagia with esophageal strictures, facial cellulitis, GERD, neuropathy, arthritis, blood dyscrasia, and recent traumatic cervical central cord injury    Significant Hospital Events: Including procedures, antibiotic start and stop dates in addition to other pertinent events   01/21: Patient underwent posterior C3-6 Cervical Laminoplasty with C2 inferior dome Laminectomy 01/22: POD #1. Pt. stable with no acute changes.  Working with PT and OT 01/23: POD #2.  Pt. noted to be very lethargic and difficult to arouse 01/24: POD #3. Pt. developed hypoxia tachycardia and shortness of breath.  Hospitalist consulted CT chest obtained and showed possible  pneumonia.  Started on IV antibiotics and DuoNebs. 01/25: POD #4. Pt. with new concerns of not moving the left arm.  CT head and cervical spine obtained with no new concerns.  Follow-up MRI however demonstrated epidural hematoma extending from the cervical spine to the lower thoracic spine with acute compression and T2 signal.  Taken emergently to the OR for surgical decompression 01/26: Pt. transferred to the ICU s/p emergent cervical decompression and evacuation of epidural hematoma.  Remained intubated. PCCM consulted 08/03/23- s/p surgery overnight, alert to self but drowsy with mild AKI and metabolic acidosis.  Lethargy noted have dcd cefepime and started doxycycline instead.  08/04/23- patient more alert able to wiggle toes to verbal.  Remains critically ill.  08/05/23- patient appears to have hypoactive delerium with waxing and waning sensorium.  At times she asks for things other times she's poorly responsive.  Her oxygenation has improved with nasal canula 5L/min.  S/p heme/onc evaluation today 08/06/23- patient seen at bedside, she was able to whisper few words tome.  I discussed PO intake with speech pathologist and SLP with barium was ordered however patient unable to complete this study. She is still very weak physically barely able to wiggle toes.  H/h <7 s/p prbc.  Renal function is improved.  08/07/23- patient had worsening hypoxemia with CXR showing worsening complete left opacification. I discussed this with neurosurgery and they stated her cervical spine did not have any new hardware so intubation should not be complicated. I met with PA Manning Charity at bedside  to review care plan. We discussed these findings with husband and proceeded with intubation and bronchoscopy.  The entire left lung appeared completely full of mucus impaction in both upper and lower lobes during bronchoscopy.  CXR post  bronchoscopy is in process.  08/08/23- patient is extubated and is able to speak few words with  family.  She moved her feet and right hand today.  I met with husband and son at bedside reviewed imaging and bloodwork. 08/09/23- patient had severe event overnight with hypoxemia , CXR This am with complete opacification of left lung again.  She went into respiratory distress overnight with severe hypoxemia.  She was emergently intubated overnight. WE notified familly and discussed events and imaging findings. She is s/p bronch with improved CXR post procedure.  08/10/23 - remains weak and minimally responsive, flaccid upper and lower extremities 08/11/23 - MRI performed overnight. Remains minimally responsive but does open eyes and track. PEG tube placed by surgery 08/12/23 - acute drop in hemoglobin. Mental status unchanged 08/13/23 - non-responsive. Remains weak, but moving upper extremity non-purposefully 08/14/23 - opens eyes, moves arms non-purposefully 08/15/23 - opens eyes, moving upper extremities, doesn't follow commands 08/16/23 - off neo, on vaso. Eyes open, moving upper and lower extremities non-purposefully 08/17/23 - s/p Trach placement. Doing well remains on the ventilator.   08/19/2023 weaning down her vent currently on PSV 10/8.   Interim History / Subjective:  Awake and alert this AM, following commands. Will trial switching to PSV.   Objective   Blood pressure (!) 147/57, pulse 60, temperature 98.4 F (36.9 C), resp. rate (!) 26, height 5\' 7"  (1.702 m), weight 70.5 kg, SpO2 96%.    Vent Mode: PSV FiO2 (%):  [30 %-40 %] 30 % Set Rate:  [16 bmp] 16 bmp Vt Set:  [380 mL] 380 mL PEEP:  [5 cmH20-8 cmH20] 5 cmH20 Pressure Support:  [12 cmH20] 12 cmH20 Plateau Pressure:  [19 cmH20-24 cmH20] 19 cmH20   Intake/Output Summary (Last 24 hours) at 08/19/2023 1411 Last data filed at 08/19/2023 1400 Gross per 24 hour  Intake 2468.07 ml  Output 3250 ml  Net -781.93 ml   Filed Weights   08/17/23 0432 08/18/23 0500 08/19/23 0410  Weight: 67.4 kg 67.4 kg 70.5 kg    Examination: General:  Awake and alert. Following commands.  HENT: s/p Trach tube placement. Shiley size 6.  Lungs: Coarse breath sounds bilaterally.  Cardiovascular: Normal S1, Normal S2, RRR Abdomen: Soft, non tender, non distended, +BS. G tube in place.  Extremities: Warm and well perfused.   Labs and imaging were reviewed.   Assessment & Plan:  [name] is a 68 year old old female with PMHx of hypothyroidism, insulin-dependent DM, CKD stage III, dysphagia with esophageal strictures, facial cellulitis, GERD, neuropathy, arthritis, blood dyscrasia, and recent traumatic cervical central cord injury who presented to the hospital for elective cervical spine surgery. S/p C2 dome laminectomy on 01/21. Course c/p by epidural hematoma s/p evacuation and cervical and thoracic laminectomy on 01/25.  Courser further comlicated by hypoxic respiratory failure and inability to protect the her airways now s/p Trach on 02/10. S/p PEG tube placement 02/04.   #Acute hypoxic respiratory failure s/p trach placement 02/10 secondary to... #Aspiration pneumonia and inability to protect airways in the setting of...  #C2 dome laminectomy 01/21 c/n epidural hematoma s/p decompression and cervical and thoracic laminectomy on 01/25.  #c/b Autonomic dysfunction now on minimal dose vasopressin  #Anemia with Hgb downtrending now at 8.8mg /dl #AKI on CKD stage IIIb likely in the setting of pre renal azotemia Cr improving now at 1.5mg /dl.   Neuro: Delirium precautions.  CVS: Increase to home Midodrine and fludrocortisone. MAP > 65.   Lungs: Wean down vent support for  SpO2 > 90%. C/w PSV and wean as tolerated.  GI: cw TF. Lax PRN. Ensure bowel movement. PPI for prophylaxis.  Endo: POC 140-180. C/w home levothyroxine. Heme: chemical DVT prophylaxis. Renal: Avoid nephrotox agents.   Dispo: Will start LTACH referral in the am.   Best Practice (right click and "Reselect all SmartList Selections" daily)   Diet/type: tubefeeds DVT  prophylaxis SCD Pressure ulcer(s): N/A GI prophylaxis: PPI Lines: Peripheral IV Foley:  removal ordered  Had to be placed again today.  Code Status:  full code  Last date of multidisciplinary goals of care discussion [08/17/2023]  I spent 40 minutes caring for this patient today, including preparing to see the patient, obtaining a medical history , reviewing a separately obtained history, performing a medically appropriate examination and/or evaluation, ordering medications, tests, or procedures, documenting clinical information in the electronic health record, and independently interpreting results (not separately reported/billed) and communicating results to the patient/family/caregiver  Janann Colonel, MD [facility] 08/19/2023 2:11 PM

## 2023-08-19 NOTE — Progress Notes (Signed)
Patient Failed Cpap due to work of breathing

## 2023-08-20 ENCOUNTER — Inpatient Hospital Stay: Payer: Medicare Other

## 2023-08-20 DIAGNOSIS — Z7189 Other specified counseling: Secondary | ICD-10-CM | POA: Diagnosis not present

## 2023-08-20 DIAGNOSIS — J90 Pleural effusion, not elsewhere classified: Secondary | ICD-10-CM

## 2023-08-20 DIAGNOSIS — J690 Pneumonitis due to inhalation of food and vomit: Secondary | ICD-10-CM | POA: Diagnosis not present

## 2023-08-20 DIAGNOSIS — G959 Disease of spinal cord, unspecified: Secondary | ICD-10-CM | POA: Diagnosis not present

## 2023-08-20 DIAGNOSIS — J9601 Acute respiratory failure with hypoxia: Secondary | ICD-10-CM | POA: Diagnosis not present

## 2023-08-20 LAB — CBC
HCT: 31.1 % — ABNORMAL LOW (ref 36.0–46.0)
Hemoglobin: 9.5 g/dL — ABNORMAL LOW (ref 12.0–15.0)
MCH: 29.9 pg (ref 26.0–34.0)
MCHC: 30.5 g/dL (ref 30.0–36.0)
MCV: 97.8 fL (ref 80.0–100.0)
Platelets: 100 10*3/uL — ABNORMAL LOW (ref 150–400)
RBC: 3.18 MIL/uL — ABNORMAL LOW (ref 3.87–5.11)
RDW: 15.6 % — ABNORMAL HIGH (ref 11.5–15.5)
WBC: 4.7 10*3/uL (ref 4.0–10.5)
nRBC: 0 % (ref 0.0–0.2)

## 2023-08-20 LAB — RENAL FUNCTION PANEL
Albumin: 2.5 g/dL — ABNORMAL LOW (ref 3.5–5.0)
Anion gap: 9 (ref 5–15)
BUN: 86 mg/dL — ABNORMAL HIGH (ref 8–23)
CO2: 25 mmol/L (ref 22–32)
Calcium: 7.9 mg/dL — ABNORMAL LOW (ref 8.9–10.3)
Chloride: 113 mmol/L — ABNORMAL HIGH (ref 98–111)
Creatinine, Ser: 1.69 mg/dL — ABNORMAL HIGH (ref 0.44–1.00)
GFR, Estimated: 33 mL/min — ABNORMAL LOW (ref >60)
Glucose, Bld: 151 mg/dL — ABNORMAL HIGH (ref 70–99)
Phosphorus: 2.7 mg/dL (ref 2.5–4.6)
Potassium: 4.9 mmol/L (ref 3.5–5.1)
Sodium: 147 mmol/L — ABNORMAL HIGH (ref 135–145)

## 2023-08-20 LAB — GLUCOSE, CAPILLARY
Glucose-Capillary: 123 mg/dL — ABNORMAL HIGH (ref 70–99)
Glucose-Capillary: 153 mg/dL — ABNORMAL HIGH (ref 70–99)
Glucose-Capillary: 200 mg/dL — ABNORMAL HIGH (ref 70–99)
Glucose-Capillary: 152 mg/dL — ABNORMAL HIGH (ref 70–99)

## 2023-08-20 LAB — MAGNESIUM: Magnesium: 2.3 mg/dL (ref 1.7–2.4)

## 2023-08-20 MED ORDER — FREE WATER
200.0000 mL | Freq: Four times a day (QID) | Status: DC
  Administered 2023-08-20 – 2023-08-21 (×4): 200 mL

## 2023-08-20 MED ORDER — INSULIN ASPART 100 UNIT/ML IJ SOLN
5.0000 [IU] | Freq: Four times a day (QID) | INTRAMUSCULAR | Status: DC
  Administered 2023-08-20 – 2023-08-21 (×6): 5 [IU] via SUBCUTANEOUS
  Filled 2023-08-20 (×6): qty 1

## 2023-08-20 MED ORDER — SODIUM CHLORIDE 3 % IN NEBU
4.0000 mL | INHALATION_SOLUTION | Freq: Three times a day (TID) | RESPIRATORY_TRACT | Status: DC
  Administered 2023-08-20 – 2023-08-21 (×2): 4 mL via RESPIRATORY_TRACT
  Filled 2023-08-20 (×3): qty 4

## 2023-08-20 MED ORDER — FLUDROCORTISONE ACETATE 0.1 MG PO TABS
0.2000 mg | ORAL_TABLET | Freq: Every day | ORAL | Status: DC
  Administered 2023-08-21: 0.2 mg
  Filled 2023-08-20: qty 2

## 2023-08-20 MED ORDER — IPRATROPIUM-ALBUTEROL 0.5-2.5 (3) MG/3ML IN SOLN: 3.0000 mL | RESPIRATORY_TRACT | Status: DC | PRN

## 2023-08-20 MED ORDER — GABAPENTIN 250 MG/5ML PO SOLN
200.0000 mg | Freq: Two times a day (BID) | ORAL | Status: DC
  Administered 2023-08-21 (×2): 200 mg
  Filled 2023-08-20 (×4): qty 4

## 2023-08-20 NOTE — Progress Notes (Signed)
Pt transported to and from CT on ventilator with RN without incident

## 2023-08-20 NOTE — Progress Notes (Signed)
NAME:  [name], MRN:  [id number], DOB:  [birth year], LOS: 23 ADMISSION DATE:  07/28/2023 History of Present Illness:  [name] is a 68 year old old female with PMHx of hypothyroidism, insulin-dependent DM, CKD stage III, dysphagia with esophageal strictures, facial cellulitis, GERD, neuropathy, arthritis, blood dyscrasia, and recent traumatic cervical central cord injury who presented to the hospital for elective cervical spine surgery.   Per patient's chart, patient was seen by neurosurgery on 07/28/2023 for follow-up after suffering a fall 3 months ago in which she sustained a cervical spinal cord injury.  Per patient, they deferred emergent surgical depression and wanted to let her have time to recover and work with physical therapy.  She continued to have hand weakness as well as lower extremity weakness and ambulatory issues.  Given the ongoing compression of C3-C6, laminoplasty and C2 dome laminectomy was indicated.     Hospital course: Patient underwent C3-6 laminoplasty and C2 dome and laminectomy surgery on 07/29/2023 with drain placement.   Pertinent Labs/Diagnostics Findings: Na+/ K+: 141/4.4 Glucose: 136 BUN/Cr.47/1.42 WBC: 5.8 K/L  Hgb/Hct: 7.4/22.4  Pertinent  Medical History  Hypothyroidism, insulin-dependent DM, CKD stage III, dysphagia with esophageal strictures, facial cellulitis, GERD, neuropathy, arthritis, blood dyscrasia, and recent traumatic cervical central cord injury    Significant Hospital Events: Including procedures, antibiotic start and stop dates in addition to other pertinent events   01/21: Patient underwent posterior C3-6 Cervical Laminoplasty with C2 inferior dome Laminectomy 01/22: POD #1. Pt. stable with no acute changes.  Working with PT and OT 01/23: POD #2.  Pt. noted to be very lethargic and difficult to arouse 01/24: POD #3. Pt. developed hypoxia tachycardia and shortness of breath.  Hospitalist consulted CT chest obtained and showed possible  pneumonia.  Started on IV antibiotics and DuoNebs. 01/25: POD #4. Pt. with new concerns of not moving the left arm.  CT head and cervical spine obtained with no new concerns.  Follow-up MRI however demonstrated epidural hematoma extending from the cervical spine to the lower thoracic spine with acute compression and T2 signal.  Taken emergently to the OR for surgical decompression 01/26: Pt. transferred to the ICU s/p emergent cervical decompression and evacuation of epidural hematoma.  Remained intubated. PCCM consulted 08/03/23- s/p surgery overnight, alert to self but drowsy with mild AKI and metabolic acidosis.  Lethargy noted have dcd cefepime and started doxycycline instead.  08/04/23- patient more alert able to wiggle toes to verbal.  Remains critically ill.  08/05/23- patient appears to have hypoactive delerium with waxing and waning sensorium.  At times she asks for things other times she's poorly responsive.  Her oxygenation has improved with nasal canula 5L/min.  S/p heme/onc evaluation today 08/06/23- patient seen at bedside, she was able to whisper few words tome.  I discussed PO intake with speech pathologist and SLP with barium was ordered however patient unable to complete this study. She is still very weak physically barely able to wiggle toes.  H/h <7 s/p prbc.  Renal function is improved.  08/07/23- patient had worsening hypoxemia with CXR showing worsening complete left opacification. I discussed this with neurosurgery and they stated her cervical spine did not have any new hardware so intubation should not be complicated. I met with PA Manning Charity at bedside  to review care plan. We discussed these findings with husband and proceeded with intubation and bronchoscopy.  The entire left lung appeared completely full of mucus impaction in both upper and lower lobes during bronchoscopy.  CXR post  bronchoscopy is in process.  08/08/23- patient is extubated and is able to speak few words with  family.  She moved her feet and right hand today.  I met with husband and son at bedside reviewed imaging and bloodwork. 08/09/23- patient had severe event overnight with hypoxemia , CXR This am with complete opacification of left lung again.  She went into respiratory distress overnight with severe hypoxemia.  She was emergently intubated overnight. WE notified familly and discussed events and imaging findings. She is s/p bronch with improved CXR post procedure.  08/10/23 - remains weak and minimally responsive, flaccid upper and lower extremities 08/11/23 - MRI performed overnight. Remains minimally responsive but does open eyes and track. PEG tube placed by surgery 08/12/23 - acute drop in hemoglobin. Mental status unchanged 08/13/23 - non-responsive. Remains weak, but moving upper extremity non-purposefully 08/14/23 - opens eyes, moves arms non-purposefully 08/15/23 - opens eyes, moving upper extremities, doesn't follow commands 08/16/23 - off neo, on vaso. Eyes open, moving upper and lower extremities non-purposefully 08/17/23 - s/p Trach placement. Doing well remains on the ventilator.   08/19/2023 weaning down her vent currently on PSV 10/8.  08/20/2023 Unable to tolerate PSV for longer than 2 hours today. Repeat CXR with near opacifications of the left lung. [place] at bedside with a large pleural effusion. Pending CT chest.   Interim History / Subjective:  Awake and alert this AM, following commands.   Objective   Blood pressure (!) 126/49, pulse (!) 54, temperature 98.6 F (37 C), resp. rate (!) 21, height 5\' 7"  (1.702 m), weight 70.5 kg, SpO2 96%.    Vent Mode: PRVC FiO2 (%):  [35 %-40 %] 40 % Set Rate:  [16 bmp] 16 bmp Vt Set:  [380 mL] 380 mL PEEP:  [5 cmH20-8 cmH20] 8 cmH20 Pressure Support:  [10 cmH20] 10 cmH20 Plateau Pressure:  [14 cmH20-21 cmH20] 21 cmH20   Intake/Output Summary (Last 24 hours) at 08/20/2023 1525 Last data filed at 08/20/2023 0600 Gross per 24 hour  Intake 1260 ml  Output  1275 ml  Net -15 ml   Filed Weights   08/18/23 0500 08/19/23 0410 08/20/23 0401  Weight: 67.4 kg 70.5 kg 70.5 kg    Examination: General: Awake and alert. Following commands.  HENT: s/p Trach tube placement. Shiley size 6.  Lungs: Coarse breath sounds bilaterally.  Cardiovascular: Normal S1, Normal S2, RRR Abdomen: Soft, non tender, non distended, +BS. G tube in place.  Extremities: Warm and well perfused.   Labs and imaging were reviewed.   Assessment & Plan:  [name] is a 68 year old old female with PMHx of hypothyroidism, insulin-dependent DM, CKD stage III, dysphagia with esophageal strictures, facial cellulitis, GERD, neuropathy, arthritis, blood dyscrasia, and recent traumatic cervical central cord injury who presented to the hospital for elective cervical spine surgery. S/p C2 dome laminectomy on 01/21. Course c/p by epidural hematoma s/p evacuation and cervical and thoracic laminectomy on 01/25.  Courser further comlicated by hypoxic respiratory failure and inability to protect the her airways now s/p Trach on 02/10. S/p PEG tube placement 02/04.   #Acute hypoxic respiratory failure s/p trach placement 02/10 secondary to... #Aspiration pneumonia and inability to protect airways in the setting of...  #C2 dome laminectomy 01/21 c/n epidural hematoma s/p decompression and cervical and thoracic laminectomy on 01/25.  #c/b Autonomic dysfunction now on minimal dose vasopressin  #Left lower lobe collapse - CXR and bedside US with large effusion. Pending CT chest.  #Anemia with Hgb downtrending now  at 8.8mg /dl #AKI on CKD stage IIIb likely in the setting of pre renal azotemia Cr improving now at 1.5mg /dl.   Neuro: Delirium precautions.  CVS: C/w home Midodrine and fludrocortisone. MAP > 65.   Lungs: Wean down vent support for SpO2 > 90%. C/w PSV and wean as tolerated. CT chest today.  GI: cw TF. Lax PRN. Ensure bowel movement. PPI for prophylaxis.  Endo: POC 140-180. C/w home  levothyroxine. Heme: chemical DVT prophylaxis. Renal: Avoid nephrotox agents.   Dispo: Will start referral to [facility].   Best Practice (right click and "Reselect all SmartList Selections" daily)   Diet/type: tubefeeds DVT prophylaxis SCD Pressure ulcer(s): N/A GI prophylaxis: PPI Lines: Peripheral IV Foley:  removal ordered  Had to be placed again today.  Code Status:  full code  Last date of multidisciplinary goals of care discussion [08/17/2023]  I spent 40 minutes caring for this patient today, including preparing to see the patient, obtaining a medical history , reviewing a separately obtained history, performing a medically appropriate examination and/or evaluation, ordering medications, tests, or procedures, documenting clinical information in the electronic health record, and independently interpreting results (not separately reported/billed) and communicating results to the patient/family/caregiver  Janann Colonel, MD [facility] 08/20/2023 3:25 PM

## 2023-08-20 NOTE — Progress Notes (Signed)
Physical Therapy Treatment Patient Details Name: [name] MRN: [id number] DOB: 1956-05-26 Today's Date: 08/20/2023   History of Present Illness Pt is a 68 y/o F admitted on 07/28/23 for elective C2 dome laminectomy, C3-6 laminoplasty. MRI on 08/01/23 showed epidural hematoma extending from the cervical spine to the lower thoracic spine with acute compression & T2 signal. Pt taken emergently to the OR for surgical decompression. Pt is s/p PEG placement 08/11/23, trach placement 08/17/23. PMH: hypothyroidism, IDDM, CKD 3, dysphagia with esophageal strictures, facial cellulitis, GERD, neuropathy, arthritis, blood dyscrasia, recent traumatic cervical central cord injury    PT Comments  Patient tolerated being in chair position in bed for ~ 15 minutes with no significant change in vitals. She was able to hold her head and neck up independently. Facilitation and cues to maintain midline sitting balance in bed and using bed rail to aide. Patient indicated she wants to write for communication however is too weak at this time. She likely could benefit from a communication board. Recommend to continue PT to maximize independence and to decrease caregiver burden.    If plan is discharge home, recommend the following: Assistance with cooking/housework;Direct supervision/assist for medications management;Direct supervision/assist for financial management;Assist for transportation;Help with stairs or ramp for entrance;Supervision due to cognitive status;Two people to help with walking and/or transfers;Two people to help with bathing/dressing/bathroom   Can travel by private vehicle     No  Equipment Recommendations  None recommended by PT    Recommendations for Other Services       Precautions / Restrictions Precautions Precautions: Cervical;Fall Restrictions Weight Bearing Restrictions Per Provider Order: No     Mobility  Bed Mobility Overal bed mobility: Needs Assistance             General  bed mobility comments: total assistance for repositioning in bed. bed placed in chair position. patient tolerated being upright with no significant change in vitals for ~ 15 minutes. patient is able to hold her head/neck upright. cues to use bed rails and RUE to faciliate positioning trunk to midline which patient can do with minimal assistance. left side lean with fatigue. ROM of LE performed in chair position    Transfers                        Ambulation/Gait                   Stairs             Wheelchair Mobility     Tilt Bed    Modified Rankin (Stroke Patients Only)       Balance                                            Communication Communication Communication: Impaired Factors Affecting Communication: Trach/intubated  Cognition Arousal: Alert Behavior During Therapy: Flat affect   PT - Cognitive impairments: No family/caregiver present to determine baseline                       PT - Cognition Comments: difficult to assess due to trach and non verbal. patient is mouthing words (although difficult to understand). she could benefit from a communication board. patient is able to follow single step commands inconsistently Following commands: Impaired Following commands impaired: Follows one step commands inconsistently (increased time, multi  modal cues)    Cueing Cueing Techniques: Verbal cues, Tactile cues, Visual cues  Exercises General Exercises - Lower Extremity Ankle Circles/Pumps: PROM, Both, 10 reps (bed in chair position) Long Arc Quad: PROM, Both, 10 reps (bed in chair position) Hip ABduction/ADduction: PROM, 10 reps (bed in chair position) Other Exercises Other Exercises: encouraged active participation of LE with no movement noted    General Comments        Pertinent Vitals/Pain Pain Assessment Pain Assessment: No/denies pain    Home Living                          Prior Function             PT Goals (current goals can now be found in the care plan section) Acute Rehab PT Goals Patient Stated Goal: none stated PT Goal Formulation: Patient unable to participate in goal setting Time For Goal Achievement: 09/01/23 Potential to Achieve Goals: Fair Progress towards PT goals: Progressing toward goals    Frequency    Min 1X/week      PT Plan      Co-evaluation PT/OT/SLP Co-Evaluation/Treatment: Yes Reason for Co-Treatment: Complexity of the patient's impairments (multi-system involvement);Necessary to address cognition/behavior during functional activity;For patient/therapist safety PT goals addressed during session: Mobility/safety with mobility OT goals addressed during session: ADL's and self-care      AM-PAC PT "6 Clicks" Mobility   Outcome Measure  Help needed turning from your back to your side while in a flat bed without using bedrails?: Total Help needed moving from lying on your back to sitting on the side of a flat bed without using bedrails?: Total Help needed moving to and from a bed to a chair (including a wheelchair)?: Total Help needed standing up from a chair using your arms (e.g., wheelchair or bedside chair)?: Total Help needed to walk in hospital room?: Total Help needed climbing 3-5 steps with a railing? : Total 6 Click Score: 6    End of Session   Activity Tolerance: Patient limited by fatigue Patient left: in bed;with call bell/phone within reach;with bed alarm set;with SCD's reapplied Nurse Communication: Mobility status PT Visit Diagnosis: Unsteadiness on feet (R26.81);Difficulty in walking, not elsewhere classified (R26.2);Other abnormalities of gait and mobility (R26.89);Other symptoms and signs involving the nervous system (R29.898);Muscle weakness (generalized) (M62.81)     Time: 2956-2130 PT Time Calculation (min) (ACUTE ONLY): 26 min  Charges:    $Therapeutic Activity: 8-22 mins PT General Charges $$ ACUTE PT VISIT:  1 Visit                     Donna Bernard, PT, MPT    Ina Homes 08/20/2023, 1:24 PM

## 2023-08-20 NOTE — Progress Notes (Signed)
 Daily Progress Note   Patient Name: [name]       Date: 08/20/2023 DOB: 10-Jul-1955  Age: 68 y.o. MRN#: [id number] Attending Physician: Janann Colonel, MD Primary Care Physician: Jerl Mina, MD Admit Date: 07/28/2023  Reason for Consultation/Follow-up: Establishing goals of care  Subjective: Notes and labs reviewed.  Into see patient.  No family at bedside.  Patient was able to say words even without a voice that could be made out.  She was also able to shake and nod her head for yes and no.  With conversation patient is clear she truly does not want ventilator support and does not want CPR and is ready to die.  With conversation she shares that she is continuing current care because she is worried about what would happen to her son if she dies.  She shares she is interested in options to help her son with moving into a group home or other option to help foster independence for him.  Continue full code and full scope at this time.  Spoke with patient's brother previously who stated that mother father and son live together.  He advised that husband works 2 jobs, and works 7 days a week.  He advises that the patient worked a full-time job as well.  He discusses that patient's son is neuro-divergent and unable to live alone, and is able to work part-time at [employer] but spends most of his time in his room playing video games.   Length of Stay: 23  Current Medications: Scheduled Meds:   Chlorhexidine Gluconate Cloth  6 each Topical Daily   feeding supplement (PROSource TF20)  60 mL Per Tube Daily   [START ON 08/21/2023] fludrocortisone  0.2 mg Per Tube Daily   FLUoxetine  20 mg Per Tube Daily   free water  200 mL Per Tube Q6H   gabapentin  200 mg Per Tube Q12H   heparin  injection (subcutaneous)  5,000 Units Subcutaneous Q8H   insulin aspart  0-15 Units Subcutaneous Q6H   insulin aspart  5 Units Subcutaneous Q6H   insulin glargine-yfgn  15 Units Subcutaneous Daily   levothyroxine  88 mcg Per Tube QAC breakfast   midodrine  10 mg Per Tube TID WC   nutrition supplement (JUVEN)  1 packet Per Tube BID BM   mouth rinse  15 mL Mouth Rinse Q2H   pantoprazole (PROTONIX) IV  40 mg Intravenous Q12H   sodium chloride flush  10-40 mL Intracatheter Q12H   sodium chloride flush  3-10 mL Intravenous Q12H    Continuous Infusions:  feeding supplement (OSMOLITE 1.5 CAL) 60 mL/hr at 08/20/23 0600    PRN Meds: acetaminophen, bisacodyl, docusate, fentaNYL (SUBLIMAZE) injection, ipratropium-albuterol, menthol-cetylpyridinium **OR** [DISCONTINUED] phenol, mouth rinse, polyethylene glycol, sodium chloride flush, sodium chloride flush  Physical Exam Pulmonary:     Comments: Tracheostomy in place Neurological:     Mental Status: She is alert.             Vital Signs: BP (!) 106/49   Pulse 63   Temp 98.2 F (36.8 C)   Resp (!) 32   Ht 5\' 7"  (1.702 m)   Wt 70.5 kg   SpO2 96%   BMI 24.34 kg/m  SpO2: SpO2: 96 % O2 Device: O2 Device: Ventilator O2 Flow Rate: O2 Flow Rate (L/min): 45 L/min  Intake/output summary:  Intake/Output Summary (Last 24 hours) at 08/20/2023 1149 Last data filed at 08/20/2023 0600 Gross per 24 hour  Intake 1260 ml  Output 2375 ml  Net -1115 ml   LBM: Last BM Date : 08/19/23 Baseline Weight: Weight: 58.9 kg Most recent weight: Weight: 70.5 kg   Patient Active Problem List   Diagnosis Date Noted   Toxic metabolic encephalopathy 08/12/2023   Myelopathy of thoracic region 08/04/2023   Hematoma 08/04/2023   Spinal stenosis, thoracic 08/04/2023   Malnutrition of moderate degree 08/03/2023   Lobar pneumonia (HCC) 08/01/2023   MRSA (methicillin resistant staph aureus) culture positive 08/01/2023   Cervical myelopathy (HCC) 07/28/2023    Spinal stenosis in cervical region 06/18/2023   Spinal cord injury at C5-C7 level without injury of spinal bone (HCC) 06/18/2023   Arm weakness 06/18/2023   Numbness and tingling 06/18/2023   Spasticity 06/15/2023   Orthostatic hypotension 06/15/2023   Esophageal stricture 05/01/2023   Dysphagia 04/30/2023   Odynophagia 04/30/2023   Gastroesophageal reflux disease 04/30/2023   Acute incomplete quadriplegia (HCC) 04/23/2023   Spinal cord injury, cervical region (HCC) 04/22/2023   Spinal cord compression, post-traumatic (HCC) 04/13/2023   Central cord syndrome (HCC) 04/13/2023   Muscle weakness of right upper extremity 04/13/2023   Weakness of right lower extremity 04/13/2023   Pelvic fracture (HCC) 04/13/2023   Cellulitis 08/12/2021   Facial cellulitis 08/11/2021   Dental caries 08/11/2021   COVID-19 virus infection 08/11/2021   Diabetes mellitus without complication (HCC)    Hypothyroidism    Stage 3a chronic kidney disease (HCC)    Non-dose-related adverse reaction to medication    Post-operative state 03/16/2017   AKI (acute kidney injury) (HCC) 01/18/2017   Acute hypoxic respiratory failure (HCC) 11/04/2015    Palliative Care Assessment & Plan     Recommendations/Plan: Continuing full code and full scope at this time as patient wants to continue life-prolonging care until she is assured that her son will be okay without her.  Code Status:    Code Status Orders  (From admission, onward)           Start     Ordered   07/28/23 1459  Full code  Continuous       Question:  By:  Answer:  Procedural case: previous code status reviewed   07/28/23 1458           Code Status History     Date Active Date Inactive Code Status Order  ID Comments User Context   04/22/2023 1239 05/12/2023 1531 Full Code [id number]  Carlos Levering Inpatient   04/22/2023 1239 04/22/2023 1239 Full Code [id number]  Carlos Levering Inpatient   04/13/2023 1640 04/22/2023 1237  Full Code [id number]  Lilia Pro, MD ED   04/13/2023 1629 04/13/2023 1640 Full Code [id number]  Erin Fulling, MD ED   08/11/2021 2222 08/16/2021 1950 Full Code [id number]  Andris Baumann, MD ED   03/16/2017 1059 03/17/2017 1935 Full Code [id number]  Schermerhorn, Ihor Austin, MD Inpatient   01/18/2017 1745 01/19/2017 2033 Full Code [id number]  Enedina Finner, MD ED   11/04/2015 1219 11/06/2015 2047 Full Code [id number]  Katha Hamming, MD ED      Care plan was discussed with CCM and RN  Thank you for allowing the Palliative Medicine Team to assist in the care of this patient.   Morton Stall, NP  Please contact Palliative Medicine Team phone at [phone] for questions and concerns.

## 2023-08-20 NOTE — Progress Notes (Signed)
[facility]  ROUNDING NOTE   Subjective:   Ill appearing Vent support 40% Responsive to name  Tube feeds at 37ml/hr  02/12 0701 - 02/13 0700 In: 1260 [NG/GT:1260] Out: 2375 [Urine:2375] Lab Results  Component Value Date   CREATININE 1.69 (H) 08/20/2023   CREATININE 1.50 (H) 08/18/2023   CREATININE 1.58 (H) 08/18/2023      Objective:  Vital signs in last 24 hours:  Temp:  [97.8 F (36.6 C)-98.4 F (36.9 C)] 98.2 F (36.8 C) (02/13 0800) Pulse Rate:  [49-70] 63 (02/13 0900) Resp:  [18-32] 32 (02/13 0900) BP: (89-149)/(43-59) 106/49 (02/13 0900) SpO2:  [87 %-99 %] 96 % (02/13 0900) FiO2 (%):  [30 %-40 %] 40 % (02/13 0841) Weight:  [70.5 kg] 70.5 kg (02/13 0401)  Weight change: 0 kg Filed Weights   08/18/23 0500 08/19/23 0410 08/20/23 0401  Weight: 67.4 kg 70.5 kg 70.5 kg    Intake/Output: I/O last 3 completed shifts: In: 2120 [NG/GT:2120] Out: 3225 [Urine:3225]   Intake/Output this shift:  No intake/output data recorded.  Physical Exam: General: Critically ill  Head: Atraumatic   Neck: Trach placed on 08/17/23  Lungs:  PRVC FiO2 40%  Heart: regular  Abdomen:  Soft, nontender, bowel sounds present  Extremities: no peripheral edema.  Neurologic: Alert  Skin: No acute rash  Access: No hemodialysis access    Basic Metabolic Panel: Recent Labs  Lab 08/14/23 0411 08/15/23 0429 08/16/23 0330 08/17/23 0445 08/18/23 0452 08/18/23 0841 08/20/23 0722  NA 148* 148* 147* 141  --  142 147*  K 3.1* 4.6 4.9 4.8  --  4.5 4.9  CL 111 112* 112* 107  --  108 113*  CO2 26 26 24 25   --  23 25  GLUCOSE 156* 274* 241* 325*  --  201* 151*  BUN 90* 83* 82* 73*  --  80* 86*  CREATININE 1.74* 1.68* 1.66* 1.55* 1.58* 1.50* 1.69*  CALCIUM 7.8* 7.8* 8.0* 7.6*  --  7.2* 7.9*  MG 2.3 2.2  --   --  2.1  --  2.3  PHOS 2.9 2.6 1.7* 2.5 3.6  --  2.7    Liver Function Tests: Recent Labs  Lab 08/14/23 0411 08/15/23 0429 08/16/23 0330 08/17/23 0445  08/20/23 0722  ALBUMIN 2.8* 2.8* 2.9* 2.6* 2.5*   No results for input(s): "LIPASE", "AMYLASE" in the last 168 hours. No results for input(s): "AMMONIA" in the last 168 hours.  CBC: Recent Labs  Lab 08/13/23 1618 08/14/23 0411 08/17/23 0445 08/17/23 1536 08/18/23 0452 08/19/23 0548 08/20/23 0722  WBC 8.7   < > 5.6 5.7 5.8 5.2 4.7  NEUTROABS 6.8  --   --   --   --   --   --   HGB 6.5*   < > 8.8* 8.5* 9.5* 9.4* 9.5*  HCT 18.9*   < > 27.9* 27.0* 30.6* 29.8* 31.1*  MCV 88.7   < > 95.5 97.5 98.4 96.4 97.8  PLT 98*   < > 94* 100* 107* 115* 100*   < > = values in this interval not displayed.    Cardiac Enzymes: No results for input(s): "CKTOTAL", "CKMB", "CKMBINDEX", "TROPONINI" in the last 168 hours.  BNP: Invalid input(s): "POCBNP"  CBG: Recent Labs  Lab 08/19/23 1217 08/19/23 1644 08/19/23 2324 08/20/23 0324 08/20/23 0543  GLUCAP 268* 221* 201* 123* 153*    Microbiology: Results for orders placed or performed during the hospital encounter of 07/28/23  MRSA Next Gen by PCR,  Nasal     Status: Abnormal   Collection Time: 08/01/23  5:00 AM   Specimen: Nasal Mucosa; Nasal Swab  Result Value Ref Range Status   MRSA by PCR Next Gen DETECTED (A) NOT DETECTED Final    Comment: RESULT CALLED TO, READ BACK BY AND VERIFIED WITH: JOAN WILLIS @0923  08/01/23 MJU (NOTE) The GeneXpert MRSA Assay (FDA approved for NASAL specimens only), is one component of a comprehensive MRSA colonization surveillance program. It is not intended to diagnose MRSA infection nor to guide or monitor treatment for MRSA infections. Test performance is not FDA approved in patients less than 28 years old. Performed at [facility], [address] [place], [place] [address]   Culture, BAL-quantitative w Gram Stain     Status: Abnormal   Collection Time: 08/07/23 11:57 AM   Specimen: Bronchoalveolar Lavage; Respiratory  Result Value Ref Range Status   Specimen Description   Final     BRONCHIAL ALVEOLAR LAVAGE Performed at [facility], [address], [place], [place] [address]    Special Requests   Final    NONE Performed at [facility], [address] [place], [place] [address]    Gram Stain   Final    MODERATE WBC PRESENT,BOTH PMN AND MONONUCLEAR FEW YEAST Performed at [facility], [address] [place], [place] [address]    Culture 30,000 COLONIES/mL CANDIDA GLABRATA (A)  Final   Report Status 08/10/2023 FINAL  Final  Culture, BAL-quantitative w Gram Stain     Status: Abnormal   Collection Time: 08/09/23 10:36 AM   Specimen: Bronchoalveolar Lavage; Respiratory  Result Value Ref Range Status   Specimen Description   Final    BRONCHIAL ALVEOLAR LAVAGE Performed at [facility], [address] [place], [place] [address]    Special Requests   Final    NONE Performed at [facility], [address] [place], [place] [address]    Gram Stain   Final    MODERATE WBC PRESENT,BOTH PMN AND MONONUCLEAR RARE GRAM POSITIVE COCCI IN PAIRS FEW YEAST Performed at [facility], [address], [place], [place] [address]    Culture 80,000 COLONIES/mL CANDIDA GLABRATA (A)  Final   Report Status 08/12/2023 FINAL  Final  Culture, blood (Routine X 2) w Reflex to ID Panel     Status: None   Collection Time: 08/10/23 11:45 AM   Specimen: BLOOD RIGHT ARM  Result Value Ref Range Status   Specimen Description BLOOD RIGHT ARM  Final   Special Requests   Final    BOTTLES DRAWN AEROBIC AND ANAEROBIC Blood Culture adequate volume   Culture   Final    NO GROWTH 5 DAYS Performed at [facility], [address] [place], [place] [address]    Report Status 08/15/2023 FINAL  Final  Culture, blood (Routine X 2) w Reflex to ID Panel     Status: None   Collection Time: 08/10/23 11:46 AM   Specimen: BLOOD LEFT HAND  Result Value Ref Range Status   Specimen Description BLOOD LEFT HAND  Final   Special Requests    Final    BOTTLES DRAWN AEROBIC AND ANAEROBIC Blood Culture adequate volume   Culture   Final    NO GROWTH 5 DAYS Performed at [facility], [address], [place], [place] [address]    Report Status 08/15/2023 FINAL  Final  Culture, Respiratory w Gram Stain     Status: None  Collection Time: 08/15/23 11:12 AM   Specimen: Tracheal Aspirate; Respiratory  Result Value Ref Range Status   Specimen Description   Final    TRACHEAL ASPIRATE Performed at [facility], [address], [place], [place] [address]    Special Requests   Final    NONE Performed at [facility], [address] [place], [place] [address]    Gram Stain   Final    FEW WBC SEEN RARE YEAST Performed at [facility], [address], [place], [place] [address]    Culture RARE CANDIDA ALBICANS  Final   Report Status 08/18/2023 FINAL  Final    Coagulation Studies: No results for input(s): "LABPROT", "INR" in the last 72 hours.   Urinalysis: No results for input(s): "COLORURINE", "LABSPEC", "PHURINE", "GLUCOSEU", "HGBUR", "BILIRUBINUR", "KETONESUR", "PROTEINUR", "UROBILINOGEN", "NITRITE", "LEUKOCYTESUR" in the last 72 hours.  Invalid input(s): "APPERANCEUR"    Imaging: No results found.    Medications:    feeding supplement (OSMOLITE 1.5 CAL) 60 mL/hr at 08/20/23 0600    Chlorhexidine Gluconate Cloth  6 each Topical Daily   feeding supplement (PROSource TF20)  60 mL Per Tube Daily   [START ON 08/21/2023] fludrocortisone  0.2 mg Per Tube Daily   FLUoxetine  20 mg Per Tube Daily   free water  100 mL Per Tube Q6H   gabapentin  100 mg Per Tube Q12H   heparin injection (subcutaneous)  5,000 Units Subcutaneous Q8H   insulin aspart  0-15 Units Subcutaneous Q6H   insulin aspart  5 Units Subcutaneous Q6H   insulin glargine-yfgn  15 Units Subcutaneous Daily   levothyroxine  88 mcg Per Tube QAC breakfast   midodrine  10 mg Per Tube TID WC   nutrition supplement (JUVEN)  1  packet Per Tube BID BM   mouth rinse  15 mL Mouth Rinse Q2H   pantoprazole (PROTONIX) IV  40 mg Intravenous Q12H   sodium chloride flush  10-40 mL Intracatheter Q12H   sodium chloride flush  3-10 mL Intravenous Q12H   acetaminophen, bisacodyl, docusate, fentaNYL (SUBLIMAZE) injection, menthol-cetylpyridinium **OR** [DISCONTINUED] phenol, mouth rinse, polyethylene glycol, sodium chloride flush, sodium chloride flush  Assessment/ Plan:  68 y.o. female with a PMHx of acute incomplete quadriplegia, history of easy bleeding, central cord syndrome, diabetes mellitus type 2, diverticulosis, GERD, hypothyroidism, peripheral neuropathy, who was admitted to [facility] on 07/28/2023 for C2 dome laminectomy, C3-6 laminoplasty with postoperative bleeding and epidural hematoma causing spinal cord compression s/p evacuation 08/01/2023 who we are asked to see now for acute kidney injury.   1.  Acute kidney injury/chronic kidney disease stage II baseline creatinine 0.75.  Suspect acute kidney injury now related to relative hypotension. 02/12 0701 - 02/13 0700 In: 1260 [NG/GT:1260] Out: 2375 [Urine:2375] Lab Results  Component Value Date   CREATININE 1.69 (H) 08/20/2023   CREATININE 1.50 (H) 08/18/2023   CREATININE 1.58 (H) 08/18/2023   Creatinine remains stable with adequate urine output. May represent new baseline. Continue supportive care  2. Hypernatremia: with calculated free water deficit of 0.7L.  - Sodium 147, recommend increasing free water flushes with tube feeds.   2.  Anemia of chronic kidney disease.   status post transfusion.  Hemoglobin remains 9.5.  3.  Acute respiratory failure. Trach placed on 08/17/23. Vent supported but weaning in progress   LOS: 23 [name] 2/13/202510:27 AM

## 2023-08-20 NOTE — TOC Progression Note (Signed)
Transition of Care [facility]) - Progression Note    Patient Details  Name: [name] MRN: [id number] Date of Birth: [birth year]  Transition of Care [facility]) CM/SW Contact  Truddie Hidden, RN Phone Number: 08/20/2023, 1:04 PM  Clinical Narrative:    Per [facility] assistant Marylene Land, and [facility] Liaison, Irving Burton, peer to peer is being offers for patient by Home and Community due at 4:30 pm today. [phone] option [phone]. MD notified. He was also provided the phone number to speakwith Irving Burton at [facility].    Expected Discharge Plan: Skilled Nursing Facility Barriers to Discharge: SNF Pending bed offer, Insurance Authorization  Expected Discharge Plan and Services   Discharge Planning Services: CM Consult   Living arrangements for the past 2 months: Single Family Home                   DME Agency: NA       HH Arranged: NA           Social Determinants of Health (SDOH) Interventions SDOH Screenings   Food Insecurity: No Food Insecurity (07/28/2023)  Housing: Low Risk  (07/28/2023)  Transportation Needs: No Transportation Needs (07/28/2023)  Utilities: Not At Risk (07/28/2023)  Depression (PHQ2-9): Low Risk  (06/15/2023)  Social Connections: Moderately Integrated (07/28/2023)  Tobacco Use: Low Risk  (07/28/2023)    Readmission Risk Interventions     No data to display

## 2023-08-20 NOTE — Progress Notes (Signed)
Neurosurgery Progress Note  History: [name] is here for C2 dome laminectomy, C3-6 laminoplasty and now s/p evacuation hematoma  POD22/19:respiratory therapy in the room adjusting vent settings.   POD20/17: NAEO POD19/16: pt had trach placed this morning   POD16/13: Pt reportedly moving upper extremities per CC this morning  POD15/12: Pt more interactive per family at bedside and CC team POD14/11: PEG placed yesterday. Pt with worsening anemia. Neurologically unchanged  POD13/10: MRI this morning does not appear to be consistent with spinal cord infarct.  Patient's neurologic exam is unchanged. POD12/9: Pt had neuro change early this morning prompting CT head and C spine.  POD 11/7: Was extubated this morning prior to evaluation.  Was able to track with eyes but was quite somnolent still.  Was reacting to painful stimuli in the bilateral lower extremities.  Left upper extremity was reacting to painful stimuli as well. POD 10/6: Pt experiencing increased work of breathing this morning and worsening findings on chest xray resulting in reintubation this morning. She is currently undergoing bronchoscopy by Dr. Karna Christmas  POD 9/5: Pt more alert this morning asking what day it is  POD 8/4: Very drowsy but arouses this morning to voice.  POD 7/3: Pt is very drowsy, but able to follow some commands  POD6/2: Pt is drowsy this morning asking "what do you want" when awaken  Interval POD5: [name] had a decline yesterday with her left upper extremity and then bilateral lower extremities.  CT scan was unremarkable MRI did show a large epidural hematoma.  She was taken emergently to the OR by Dr. Katrinka Blazing for decompression.  She has been monitored in the ICU overnight. She is still intubated and on sedation. She is on pressors to maintain [facility]   Hospital Course POD4: [name] is more awake today. She states she isnt having a lot of pain but she is thirsty.  POD3:Patient more alert this AM.  However; hypoxia, tachycardia, SOB, low energy.  POD2: Very lethargic and difficult to arouse, states in quite a bit of pain. Medicine had been held due to sleepiness.    Physical Exam: Vitals:   08/20/23 0600 08/20/23 0700  BP: (!) 108/55 (!) 94/49  Pulse: 62 61  Resp: (!) 27 (!) 28  Temp:    SpO2: 91% (!) 87%    Trach in place  Reaching for trach with right arm  Data:  Output by Drain (mL) 08/18/23 0701 - 08/18/23 1900 08/18/23 1901 - 08/19/23 0700 08/19/23 0701 - 08/19/23 1900 08/19/23 1901 - 08/20/23 0700 08/20/23 0701 - 08/20/23 4098  Requested LDAs do not have output data documented.     Assessment/Plan:  [name]  s/p C2 dome laminectomy, C3-6 laminoplasty  On 1/21 with return to OR on 1/25 for cervical/thoracic laminectomy and epidural hematoma evacuation.  She has had a complicated postoperative course with a spontaneous cervical thoracic hemorrhage on 125 which was taken emergently for decompression.  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  - MRI of neuro axis 08/10/23 showing diffuse central spinal cord swelling concerning for spinal cord infarct.  DWI series repeated 2/4  which did not show restricted diffusion.  - pain control continue with IV and oral medications - HV removed on 1/29 - EEG showing moderate to severe encephalopathy  - trach placed by ENT on 2/10.  - staples removed 2/10.  - CC and IM  assisting with non surgical management. We appreciate your assistance.   Manning Charity PA-C Department of Neurosurgery

## 2023-08-20 NOTE — Progress Notes (Signed)
Occupational Therapy Treatment Patient Details Name: [name] MRN: [id number] DOB: 09-Aug-1955 Today's Date: 08/20/2023   History of present illness Pt is a 68 y/o F admitted on 07/28/23 for elective C2 dome laminectomy, C3-6 laminoplasty. MRI on 08/01/23 showed epidural hematoma extending from the cervical spine to the lower thoracic spine with acute compression & T2 signal. Pt taken emergently to the OR for surgical decompression. Pt is s/p PEG placement 08/11/23, trach placement 08/17/23. PMH: hypothyroidism, IDDM, CKD 3, dysphagia with esophageal strictures, facial cellulitis, GERD, neuropathy, arthritis, blood dyscrasia, recent traumatic cervical central cord injury   OT comments  Ms Loring was seen for OT/PT co-treatment on this date. Upon arrival to room pt in bed, agreeable to tx. Pt requires MOD A hand over hand face washing and combing hair in bed in chair position with RUE; assist to sustain grip and for shoulder flexion above ~80*. Use of bed rails to facilitate trunk control for upright posture in bed in chair position. TOTAL A don/doff B socks in bed. Pt making progress toward goals, will continue to follow POC. Discharge recommendation remains appropriate.        If plan is discharge home, recommend the following:  Two people to help with walking and/or transfers;Two people to help with bathing/dressing/bathroom   Equipment Recommendations  Hospital bed;Hoyer lift    Recommendations for Other Services      Precautions / Restrictions Precautions Precautions: Cervical;Fall Restrictions Weight Bearing Restrictions Per Provider Order: No       Mobility Bed Mobility               General bed mobility comments: use of bed rails to facilitate trunk control for upright posture in bed in chair position    Transfers                   General transfer comment: unsafe to attempt         ADL either performed or assessed with clinical judgement   ADL Overall  ADL's : Needs assistance/impaired                                       General ADL Comments: MOD A hand over hand face washing and combing hair in bed in chair position with RUE; assist to sustain grip and for shoulder flexion above ~80*. TOTAL A don/doff B socks in bed     Communication Communication Communication: Impaired Factors Affecting Communication: Trach/intubated   Cognition Arousal: Alert Behavior During Therapy: Flat affect                                 Following commands: Impaired Following commands impaired: Follows one step commands inconsistently      Cueing   Cueing Techniques: Verbal cues, Tactile cues, Visual cues  Exercises      Shoulder Instructions       General Comments Fi02 40%, patient on vent support    Pertinent Vitals/ Pain       Pain Assessment Pain Assessment: No/denies pain   Frequency  Min 1X/week        Progress Toward Goals  OT Goals(current goals can now be found in the care plan section)  Progress towards OT goals: Progressing toward goals  Acute Rehab OT Goals OT Goal Formulation: Patient unable to participate in goal setting Time For  Goal Achievement: 09/01/23 Potential to Achieve Goals: Poor ADL Goals Pt Will Perform Grooming: with mod assist;bed level Pt Will Perform Lower Body Dressing: with max assist;bed level Pt Will Transfer to Toilet: with max assist Additional ADL Goal #1: Pt will follow 75% of commands in preparation for ADL participation  Plan      Co-evaluation    PT/OT/SLP Co-Evaluation/Treatment: Yes Reason for Co-Treatment: Complexity of the patient's impairments (multi-system involvement);Necessary to address cognition/behavior during functional activity;For patient/therapist safety PT goals addressed during session: Mobility/safety with mobility OT goals addressed during session: ADL's and self-care      AM-PAC OT "6 Clicks" Daily Activity     Outcome  Measure   Help from another person eating meals?: Total Help from another person taking care of personal grooming?: A Lot Help from another person toileting, which includes using toliet, bedpan, or urinal?: Total Help from another person bathing (including washing, rinsing, drying)?: Total Help from another person to put on and taking off regular upper body clothing?: A Lot Help from another person to put on and taking off regular lower body clothing?: Total 6 Click Score: 8    End of Session    OT Visit Diagnosis: Unsteadiness on feet (R26.81);Muscle weakness (generalized) (M62.81)   Activity Tolerance Patient tolerated treatment well   Patient Left in bed;with call bell/phone within reach   Nurse Communication          Time: 3664-4034 OT Time Calculation (min): 18 min  Charges: OT General Charges $OT Visit: 1 Visit OT Treatments $Self Care/Home Management : 8-22 mins  Kathie Dike, M.S. OTR/L  08/20/23, 2:31 PM  ascom [phone]

## 2023-08-20 NOTE — Progress Notes (Signed)
Pt failed PSV wean due to RR >35

## 2023-08-21 ENCOUNTER — Inpatient Hospital Stay: Payer: Medicare Other

## 2023-08-21 DIAGNOSIS — J9601 Acute respiratory failure with hypoxia: Secondary | ICD-10-CM | POA: Diagnosis not present

## 2023-08-21 DIAGNOSIS — J90 Pleural effusion, not elsewhere classified: Secondary | ICD-10-CM | POA: Diagnosis not present

## 2023-08-21 DIAGNOSIS — J690 Pneumonitis due to inhalation of food and vomit: Secondary | ICD-10-CM | POA: Diagnosis not present

## 2023-08-21 DIAGNOSIS — E119 Type 2 diabetes mellitus without complications: Secondary | ICD-10-CM | POA: Diagnosis not present

## 2023-08-21 DIAGNOSIS — I959 Hypotension, unspecified: Secondary | ICD-10-CM | POA: Diagnosis not present

## 2023-08-21 DIAGNOSIS — E039 Hypothyroidism, unspecified: Secondary | ICD-10-CM | POA: Diagnosis not present

## 2023-08-21 DIAGNOSIS — G959 Disease of spinal cord, unspecified: Secondary | ICD-10-CM | POA: Diagnosis not present

## 2023-08-21 LAB — RENAL FUNCTION PANEL
Albumin: 2.6 g/dL — ABNORMAL LOW (ref 3.5–5.0)
Anion gap: 6 (ref 5–15)
BUN: 92 mg/dL — ABNORMAL HIGH (ref 8–23)
CO2: 27 mmol/L (ref 22–32)
Calcium: 7.8 mg/dL — ABNORMAL LOW (ref 8.9–10.3)
Chloride: 111 mmol/L (ref 98–111)
Creatinine, Ser: 1.79 mg/dL — ABNORMAL HIGH (ref 0.44–1.00)
GFR, Estimated: 31 mL/min — ABNORMAL LOW (ref >60)
Glucose, Bld: 260 mg/dL — ABNORMAL HIGH (ref 70–99)
Phosphorus: 3.3 mg/dL (ref 2.5–4.6)
Potassium: 5.9 mmol/L — ABNORMAL HIGH (ref 3.5–5.1)
Sodium: 144 mmol/L (ref 135–145)

## 2023-08-21 LAB — CBC
HCT: 30.5 % — ABNORMAL LOW (ref 36.0–46.0)
Hemoglobin: 9.7 g/dL — ABNORMAL LOW (ref 12.0–15.0)
MCH: 30.3 pg (ref 26.0–34.0)
MCHC: 31.8 g/dL (ref 30.0–36.0)
MCV: 95.3 fL (ref 80.0–100.0)
Platelets: 106 10*3/uL — ABNORMAL LOW (ref 150–400)
RBC: 3.2 MIL/uL — ABNORMAL LOW (ref 3.87–5.11)
RDW: 15.5 % (ref 11.5–15.5)
WBC: 8 10*3/uL (ref 4.0–10.5)
nRBC: 0 % (ref 0.0–0.2)

## 2023-08-21 LAB — GLUCOSE, CAPILLARY
Glucose-Capillary: 213 mg/dL — ABNORMAL HIGH (ref 70–99)
Glucose-Capillary: 214 mg/dL — ABNORMAL HIGH (ref 70–99)
Glucose-Capillary: 251 mg/dL — ABNORMAL HIGH (ref 70–99)

## 2023-08-21 MED ORDER — GABAPENTIN 250 MG/5ML PO SOLN: 200.0000 mg | Freq: Two times a day (BID) | ORAL | Status: DC

## 2023-08-21 MED ORDER — ORAL CARE MOUTH RINSE: 15.0000 mL | OROMUCOSAL | Status: DC

## 2023-08-21 MED ORDER — POLYETHYLENE GLYCOL 3350 17 G PO PACK: 17.0000 g | PACK | Freq: Every day | ORAL | Status: DC | PRN

## 2023-08-21 MED ORDER — PROSOURCE TF20 ENFIT COMPATIBL EN LIQD: 60.0000 mL | Freq: Every day | ENTERAL | Status: DC

## 2023-08-21 MED ORDER — INSULIN GLARGINE-YFGN 100 UNIT/ML ~~LOC~~ SOLN: 15.0000 [IU] | Freq: Every day | SUBCUTANEOUS | Status: DC

## 2023-08-21 MED ORDER — OSMOLITE 1.5 CAL PO LIQD: 1000.0000 mL | ORAL | Status: DC

## 2023-08-21 MED ORDER — MIDODRINE HCL 10 MG PO TABS: 10.0000 mg | ORAL_TABLET | Freq: Three times a day (TID) | ORAL | Status: DC

## 2023-08-21 MED ORDER — ACETAMINOPHEN 325 MG PO TABS: 650.0000 mg | ORAL_TABLET | Freq: Four times a day (QID) | ORAL | Status: DC | PRN

## 2023-08-21 MED ORDER — SODIUM CHLORIDE 3 % IN NEBU: 4.0000 mL | INHALATION_SOLUTION | Freq: Three times a day (TID) | RESPIRATORY_TRACT | Status: DC

## 2023-08-21 MED ORDER — FLUDROCORTISONE ACETATE 0.1 MG PO TABS: 0.2000 mg | ORAL_TABLET | Freq: Every day | ORAL | Status: DC

## 2023-08-21 MED ORDER — FREE WATER: 200.0000 mL | Freq: Four times a day (QID) | Status: DC

## 2023-08-21 MED ORDER — SODIUM ZIRCONIUM CYCLOSILICATE 5 G PO PACK
10.0000 g | PACK | Freq: Once | ORAL | Status: AC
  Administered 2023-08-21: 10 g
  Filled 2023-08-21: qty 2

## 2023-08-21 MED ORDER — SODIUM ZIRCONIUM CYCLOSILICATE 5 G PO PACK: 5.0000 g | PACK | Freq: Once | ORAL | Status: DC

## 2023-08-21 MED ORDER — MENTHOL 3 MG MT LOZG: 1.0000 | LOZENGE | OROMUCOSAL | Status: DC | PRN

## 2023-08-21 MED ORDER — IPRATROPIUM-ALBUTEROL 0.5-2.5 (3) MG/3ML IN SOLN: 3.0000 mL | RESPIRATORY_TRACT | Status: DC | PRN

## 2023-08-21 MED ORDER — FLUOXETINE HCL 20 MG PO CAPS: 20.0000 mg | ORAL_CAPSULE | Freq: Every day | ORAL | Status: DC

## 2023-08-21 MED ORDER — LACTATED RINGERS IV BOLUS
500.0000 mL | Freq: Once | INTRAVENOUS | Status: AC
  Administered 2023-08-21: 500 mL via INTRAVENOUS

## 2023-08-21 MED ORDER — INSULIN ASPART 100 UNIT/ML IJ SOLN
0.0000 [IU] | Freq: Four times a day (QID) | INTRAMUSCULAR | Status: DC
Start: 2023-08-21 — End: 2023-11-10

## 2023-08-21 MED ORDER — CHLORHEXIDINE GLUCONATE CLOTH 2 % EX PADS: 6.0000 | MEDICATED_PAD | Freq: Every day | CUTANEOUS | Status: DC

## 2023-08-21 MED ORDER — SODIUM CHLORIDE 0.9% FLUSH: 10.0000 mL | Freq: Two times a day (BID) | INTRAVENOUS | Status: DC

## 2023-08-21 MED ORDER — ESOMEPRAZOLE SODIUM 40 MG IV SOLR: 40.0000 mg | Freq: Every day | INTRAVENOUS | Status: DC

## 2023-08-21 MED ORDER — INSULIN ASPART 100 UNIT/ML IJ SOLN
5.0000 [IU] | Freq: Four times a day (QID) | INTRAMUSCULAR | Status: DC
Start: 2023-08-21 — End: 2023-11-10

## 2023-08-21 MED ORDER — JUVEN PO PACK: 1.0000 | PACK | Freq: Two times a day (BID) | ORAL | Status: DC

## 2023-08-21 MED ORDER — FENTANYL CITRATE PF 50 MCG/ML IJ SOSY
25.0000 ug | PREFILLED_SYRINGE | INTRAMUSCULAR | Status: DC | PRN
Start: 2023-08-21 — End: 2023-11-10

## 2023-08-21 MED ORDER — SODIUM CHLORIDE 0.9% FLUSH: 10.0000 mL | INTRAVENOUS | Status: DC | PRN

## 2023-08-21 MED ORDER — DOCUSATE SODIUM 50 MG/5ML PO LIQD: 100.0000 mg | Freq: Two times a day (BID) | ORAL | Status: DC | PRN

## 2023-08-21 MED ORDER — SODIUM CHLORIDE 0.9% FLUSH: 3.0000 mL | Freq: Two times a day (BID) | INTRAVENOUS | Status: DC

## 2023-08-21 MED ORDER — HEPARIN SODIUM (PORCINE) 5000 UNIT/ML IJ SOLN: 5000.0000 [IU] | Freq: Three times a day (TID) | INTRAMUSCULAR | Status: DC

## 2023-08-21 MED ORDER — BISACODYL 10 MG RE SUPP: 10.0000 mg | Freq: Every day | RECTAL | Status: DC | PRN

## 2023-08-21 MED ORDER — LEVOTHYROXINE SODIUM 88 MCG PO TABS: 88.0000 ug | ORAL_TABLET | Freq: Every day | ORAL | Status: DC

## 2023-08-21 MED ORDER — ORAL CARE MOUTH RINSE: 15.0000 mL | OROMUCOSAL | Status: DC | PRN

## 2023-08-21 MED ORDER — SODIUM CHLORIDE 0.9% FLUSH
3.0000 mL | INTRAVENOUS | Status: DC | PRN
Start: 2023-08-21 — End: 2023-11-10

## 2023-08-21 NOTE — Progress Notes (Signed)
[facility]  ROUNDING NOTE   Subjective:   Ill appearing Vent support 40% Able to nod yes, no Denies pain  Tube feeds at 31ml/hr  02/13 0701 - 02/14 0700 In: 589.7 [NG/GT:589.7] Out: 1110 [Urine:1110] Lab Results  Component Value Date   CREATININE 1.79 (H) 08/21/2023   CREATININE 1.69 (H) 08/20/2023   CREATININE 1.50 (H) 08/18/2023      Objective:  Vital signs in last 24 hours:  Temp:  [96.9 F (36.1 C)-98.6 F (37 C)] 97.5 F (36.4 C) (02/14 0800) Pulse Rate:  [47-59] 54 (02/14 1000) Resp:  [20-31] 26 (02/14 1000) BP: (79-128)/(39-63) 106/56 (02/14 1000) SpO2:  [89 %-97 %] 96 % (02/14 1000) FiO2 (%):  [40 %] 40 % (02/14 1000) Weight:  [70.2 kg] 70.2 kg (02/14 0500)  Weight change: -0.3 kg Filed Weights   08/19/23 0410 08/20/23 0401 08/21/23 0500  Weight: 70.5 kg 70.5 kg 70.2 kg    Intake/Output: I/O last 3 completed shifts: In: 1549.7 [NG/GT:1549.7] Out: 1885 [Urine:1885]   Intake/Output this shift:  Total I/O In: 278 [NG/GT:278] Out: 35 [Stool:35]  Physical Exam: General: Critically ill  Head: Atraumatic   Neck: Trach placed on 08/17/23  Lungs:  PRVC FiO2 40%  Heart: regular  Abdomen:  Soft, nontender, bowel sounds present  Extremities: no peripheral edema.  Neurologic: Alert, response to simple questions   Skin: No acute rash  Access: No hemodialysis access    Basic Metabolic Panel: Recent Labs  Lab 08/15/23 0429 08/16/23 0330 08/17/23 0445 08/18/23 0452 08/18/23 0841 08/20/23 0722 08/21/23 0611  NA 148* 147* 141  --  142 147* 144  K 4.6 4.9 4.8  --  4.5 4.9 5.9*  CL 112* 112* 107  --  108 113* 111  CO2 26 24 25   --  23 25 27   GLUCOSE 274* 241* 325*  --  201* 151* 260*  BUN 83* 82* 73*  --  80* 86* 92*  CREATININE 1.68* 1.66* 1.55* 1.58* 1.50* 1.69* 1.79*  CALCIUM 7.8* 8.0* 7.6*  --  7.2* 7.9* 7.8*  MG 2.2  --   --  2.1  --  2.3  --   PHOS 2.6 1.7* 2.5 3.6  --  2.7 3.3    Liver Function Tests: Recent Labs  Lab  08/15/23 0429 08/16/23 0330 08/17/23 0445 08/20/23 0722 08/21/23 0611  ALBUMIN 2.8* 2.9* 2.6* 2.5* 2.6*   No results for input(s): "LIPASE", "AMYLASE" in the last 168 hours. No results for input(s): "AMMONIA" in the last 168 hours.  CBC: Recent Labs  Lab 08/17/23 1536 08/18/23 0452 08/19/23 0548 08/20/23 0722 08/21/23 0611  WBC 5.7 5.8 5.2 4.7 8.0  HGB 8.5* 9.5* 9.4* 9.5* 9.7*  HCT 27.0* 30.6* 29.8* 31.1* 30.5*  MCV 97.5 98.4 96.4 97.8 95.3  PLT 100* 107* 115* 100* 106*    Cardiac Enzymes: No results for input(s): "CKTOTAL", "CKMB", "CKMBINDEX", "TROPONINI" in the last 168 hours.  BNP: Invalid input(s): "POCBNP"  CBG: Recent Labs  Lab 08/20/23 0543 08/20/23 1326 08/20/23 1756 08/21/23 0014 08/21/23 0559  GLUCAP 153* 200* 152* 213* 214*    Microbiology: Results for orders placed or performed during the hospital encounter of 07/28/23  MRSA Next Gen by PCR, Nasal     Status: Abnormal   Collection Time: 08/01/23  5:00 AM   Specimen: Nasal Mucosa; Nasal Swab  Result Value Ref Range Status   MRSA by PCR Next Gen DETECTED (A) NOT DETECTED Final    Comment: RESULT CALLED TO,  READ BACK BY AND VERIFIED WITH: Delorse Lek @0923  08/01/23 MJU (NOTE) The GeneXpert MRSA Assay (FDA approved for NASAL specimens only), is one component of a comprehensive MRSA colonization surveillance program. It is not intended to diagnose MRSA infection nor to guide or monitor treatment for MRSA infections. Test performance is not FDA approved in patients less than 18 years old. Performed at [facility], [address] [place], [place] [address]   Culture, BAL-quantitative w Gram Stain     Status: Abnormal   Collection Time: 08/07/23 11:57 AM   Specimen: Bronchoalveolar Lavage; Respiratory  Result Value Ref Range Status   Specimen Description   Final    BRONCHIAL ALVEOLAR LAVAGE Performed at [facility], [address], [place], [place] [address]    Special  Requests   Final    NONE Performed at [facility], [address] [place], [place] [address]    Gram Stain   Final    MODERATE WBC PRESENT,BOTH PMN AND MONONUCLEAR FEW YEAST Performed at [facility], [address], [place], [place] [address]    Culture 30,000 COLONIES/mL CANDIDA GLABRATA (A)  Final   Report Status 08/10/2023 FINAL  Final  Culture, BAL-quantitative w Gram Stain     Status: Abnormal   Collection Time: 08/09/23 10:36 AM   Specimen: Bronchoalveolar Lavage; Respiratory  Result Value Ref Range Status   Specimen Description   Final    BRONCHIAL ALVEOLAR LAVAGE Performed at [facility], [address] [place], [place] [address]    Special Requests   Final    NONE Performed at [facility], [address] [place], [place] [address]    Gram Stain   Final    MODERATE WBC PRESENT,BOTH PMN AND MONONUCLEAR RARE GRAM POSITIVE COCCI IN PAIRS FEW YEAST Performed at [facility], [address] [place], [place] [address]    Culture 80,000 COLONIES/mL CANDIDA GLABRATA (A)  Final   Report Status 08/12/2023 FINAL  Final  Culture, blood (Routine X 2) w Reflex to ID Panel     Status: None   Collection Time: 08/10/23 11:45 AM   Specimen: BLOOD RIGHT ARM  Result Value Ref Range Status   Specimen Description BLOOD RIGHT ARM  Final   Special Requests   Final    BOTTLES DRAWN AEROBIC AND ANAEROBIC Blood Culture adequate volume   Culture   Final    NO GROWTH 5 DAYS Performed at [facility], [address] [place], [place] [address]    Report Status 08/15/2023 FINAL  Final  Culture, blood (Routine X 2) w Reflex to ID Panel     Status: None   Collection Time: 08/10/23 11:46 AM   Specimen: BLOOD LEFT HAND  Result Value Ref Range Status   Specimen Description BLOOD LEFT HAND  Final   Special Requests   Final    BOTTLES DRAWN AEROBIC AND ANAEROBIC Blood Culture adequate volume   Culture   Final    NO GROWTH 5  DAYS Performed at [facility], [address], [place], [place] [address]    Report Status 08/15/2023 FINAL  Final  Culture, Respiratory w Gram Stain     Status: None   Collection Time: 08/15/23 11:12 AM   Specimen: Tracheal Aspirate; Respiratory  Result Value Ref Range Status   Specimen Description   Final    TRACHEAL ASPIRATE Performed at [facility], [address] [place], [place] [address]    Special  Requests   Final    NONE Performed at [facility], [address] [place], [place] [address]    Gram Stain   Final    FEW WBC SEEN RARE YEAST Performed at [facility], [address] [place], [place] [address]    Culture RARE CANDIDA ALBICANS  Final   Report Status 08/18/2023 FINAL  Final    Coagulation Studies: No results for input(s): "LABPROT", "INR" in the last 72 hours.   Urinalysis: No results for input(s): "COLORURINE", "LABSPEC", "PHURINE", "GLUCOSEU", "HGBUR", "BILIRUBINUR", "KETONESUR", "PROTEINUR", "UROBILINOGEN", "NITRITE", "LEUKOCYTESUR" in the last 72 hours.  Invalid input(s): "APPERANCEUR"    Imaging: CT CHEST WO CONTRAST Result Date: 08/20/2023 CLINICAL DATA:  Shortness of breath EXAM: CT CHEST WITHOUT CONTRAST TECHNIQUE: Multidetector CT imaging of the chest was performed following the standard protocol without IV contrast. RADIATION DOSE REDUCTION: This exam was performed according to the departmental dose-optimization program which includes automated exposure control, adjustment of the mA and/or kV according to patient size and/or use of iterative reconstruction technique. COMPARISON:  07/31/2023 FINDINGS: Cardiovascular: Heart is normal size. Aorta normal caliber. Coronary artery and aortic atherosclerosis. Mediastinum/Nodes: No mediastinal, hilar, or axillary adenopathy. Trachea and esophagus are unremarkable. Thyroid unremarkable. Tracheostomy tube in the midtrachea. Lungs/Pleura: Small bilateral pleural effusions,  left greater than right. Left lower lobe airspace opacity. Airspace opacity also noted in the posterior left upper lobe. Findings concerning for pneumonia. Minimal right base atelectasis. Upper Abdomen: No acute findings Musculoskeletal: Chest wall soft tissues are unremarkable. No acute bony abnormality. IMPRESSION: Bilateral pleural effusions, left greater than right. Continued consolidation in the left lower lobe and posterior left upper lobe compatible with pneumonia. Coronary artery disease. Aortic Atherosclerosis (ICD10-I70.0). Electronically Signed   By: Charlett Nose M.D.   On: 08/20/2023 18:31   DG Chest Port 1 View Result Date: 08/20/2023 CLINICAL DATA:  Shortness of breath.  Tracheostomy tube placed. EXAM: PORTABLE CHEST 1 VIEW COMPARISON:  08/09/2023. FINDINGS: Interval complete opacification of the lower half of the left hemithorax with continued mediastinal shift to the left, accentuated by some patient rotation to the left. Clear right lung. Normal-sized heart. Tracheostomy tube in satisfactory position. Old left clavicle fracture. Thoracolumbar spine degenerative changes. IMPRESSION: 1. Interval complete opacification of the lower half of the left hemithorax with continued mediastinal shift to the left, accentuated by some patient rotation to the left. This is compatible with progressive pneumonia and possible postobstructive changes with atelectasis. Pleural fluid cannot be excluded. 2. Tracheostomy tube in satisfactory position. Electronically Signed   By: Beckie Salts M.D.   On: 08/20/2023 15:05      Medications:    feeding supplement (OSMOLITE 1.5 CAL) 60 mL/hr at 08/21/23 1000   lactated ringers      Chlorhexidine Gluconate Cloth  6 each Topical Daily   feeding supplement (PROSource TF20)  60 mL Per Tube Daily   fludrocortisone  0.2 mg Per Tube Daily   FLUoxetine  20 mg Per Tube Daily   free water  200 mL Per Tube Q6H   gabapentin  200 mg Per Tube Q12H   heparin injection  (subcutaneous)  5,000 Units Subcutaneous Q8H   insulin aspart  0-15 Units Subcutaneous Q6H   insulin aspart  5 Units Subcutaneous Q6H   insulin glargine-yfgn  15 Units Subcutaneous Daily   levothyroxine  88 mcg Per Tube QAC breakfast   midodrine  10 mg Per Tube TID WC   nutrition supplement (JUVEN)  1 packet Per Tube  BID BM   mouth rinse  15 mL Mouth Rinse Q2H   pantoprazole (PROTONIX) IV  40 mg Intravenous Q12H   sodium chloride flush  10-40 mL Intracatheter Q12H   sodium chloride flush  3-10 mL Intravenous Q12H   sodium chloride HYPERTONIC  4 mL Nebulization Q8H   acetaminophen, bisacodyl, docusate, fentaNYL (SUBLIMAZE) injection, ipratropium-albuterol, menthol-cetylpyridinium **OR** [DISCONTINUED] phenol, mouth rinse, polyethylene glycol, sodium chloride flush, sodium chloride flush  Assessment/ Plan:  68 y.o. female with a PMHx of acute incomplete quadriplegia, history of easy bleeding, central cord syndrome, diabetes mellitus type 2, diverticulosis, GERD, hypothyroidism, peripheral neuropathy, who was admitted to [facility] on 07/28/2023 for C2 dome laminectomy, C3-6 laminoplasty with postoperative bleeding and epidural hematoma causing spinal cord compression s/p evacuation 08/01/2023 who we are asked to see now for acute kidney injury.   1.  Acute kidney injury/chronic kidney disease stage II baseline creatinine 0.75.  Suspect acute kidney injury now related to relative hypotension. 02/13 0701 - 02/14 0700 In: 589.7 [NG/GT:589.7] Out: 1110 [Urine:1110] Lab Results  Component Value Date   CREATININE 1.79 (H) 08/21/2023   CREATININE 1.69 (H) 08/20/2023   CREATININE 1.50 (H) 08/18/2023   Creatinine rising slowly. Maintains good urine output. Continue supportive care  2. Hypernatremia: with calculated free water deficit of 0.7L.  - Sodium 144, recommend increasing free water flushes with tube feeds.   2.  Anemia of chronic kidney disease.   status post transfusion.  Hemoglobin 9.7.  3.   Acute respiratory failure. Trach placed on 08/17/23. Vent supported but weaning in progress   LOS: 24 Camary Sosa 2/14/202510:49 AM

## 2023-08-21 NOTE — Plan of Care (Signed)
  Problem: Fluid Volume: Goal: Ability to maintain a balanced intake and output will improve Outcome: Progressing   Problem: Nutritional: Goal: Maintenance of adequate nutrition will improve Outcome: Progressing   Problem: Clinical Measurements: Goal: Ability to maintain clinical measurements within normal limits will improve Outcome: Progressing Goal: Will remain free from infection Outcome: Progressing Goal: Diagnostic test results will improve Outcome: Progressing   Problem: Metabolic: Goal: Ability to maintain appropriate glucose levels will improve Outcome: Not Progressing

## 2023-08-21 NOTE — Progress Notes (Signed)
NAME:  [name], MRN:  [id number], DOB:  1956-02-01, LOS: 24 ADMISSION DATE:  07/28/2023 History of Present Illness:  [name] is a 68 year old old female with PMHx of hypothyroidism, insulin-dependent DM, CKD stage III, dysphagia with esophageal strictures, facial cellulitis, GERD, neuropathy, arthritis, blood dyscrasia, and recent traumatic cervical central cord injury who presented to the hospital for elective cervical spine surgery.   Per patient's chart, patient was seen by neurosurgery on 07/28/2023 for follow-up after suffering a fall 3 months ago in which she sustained a cervical spinal cord injury.  Per patient, they deferred emergent surgical depression and wanted to let her have time to recover and work with physical therapy.  She continued to have hand weakness as well as lower extremity weakness and ambulatory issues.  Given the ongoing compression of C3-C6, laminoplasty and C2 dome laminectomy was indicated.     Hospital course: Patient underwent C3-6 laminoplasty and C2 dome and laminectomy surgery on 07/29/2023 with drain placement.   Pertinent Labs/Diagnostics Findings: Na+/ K+: 141/4.4 Glucose: 136 BUN/Cr.47/1.42 WBC: 5.8 K/L  Hgb/Hct: 7.4/22.4  Pertinent  Medical History  Hypothyroidism, insulin-dependent DM, CKD stage III, dysphagia with esophageal strictures, facial cellulitis, GERD, neuropathy, arthritis, blood dyscrasia, and recent traumatic cervical central cord injury    Significant Hospital Events: Including procedures, antibiotic start and stop dates in addition to other pertinent events   01/21: Patient underwent posterior C3-6 Cervical Laminoplasty with C2 inferior dome Laminectomy 01/22: POD #1. Pt. stable with no acute changes.  Working with PT and OT 01/23: POD #2.  Pt. noted to be very lethargic and difficult to arouse 01/24: POD #3. Pt. developed hypoxia tachycardia and shortness of breath.  Hospitalist consulted CT chest obtained and showed possible  pneumonia.  Started on IV antibiotics and DuoNebs. 01/25: POD #4. Pt. with new concerns of not moving the left arm.  CT head and cervical spine obtained with no new concerns.  Follow-up MRI however demonstrated epidural hematoma extending from the cervical spine to the lower thoracic spine with acute compression and T2 signal.  Taken emergently to the OR for surgical decompression 01/26: Pt. transferred to the ICU s/p emergent cervical decompression and evacuation of epidural hematoma.  Remained intubated. PCCM consulted 08/03/23- s/p surgery overnight, alert to self but drowsy with mild AKI and metabolic acidosis.  Lethargy noted have dcd cefepime and started doxycycline instead.  08/04/23- patient more alert able to wiggle toes to verbal.  Remains critically ill.  08/05/23- patient appears to have hypoactive delerium with waxing and waning sensorium.  At times she asks for things other times she's poorly responsive.  Her oxygenation has improved with nasal canula 5L/min.  S/p heme/onc evaluation today 08/06/23- patient seen at bedside, she was able to whisper few words tome.  I discussed PO intake with speech pathologist and SLP with barium was ordered however patient unable to complete this study. She is still very weak physically barely able to wiggle toes.  H/h <7 s/p prbc.  Renal function is improved.  08/07/23- patient had worsening hypoxemia with CXR showing worsening complete left opacification. I discussed this with neurosurgery and they stated her cervical spine did not have any new hardware so intubation should not be complicated. I met with PA Manning Charity at bedside  to review care plan. We discussed these findings with husband and proceeded with intubation and bronchoscopy.  The entire left lung appeared completely full of mucus impaction in both upper and lower lobes during bronchoscopy.  CXR post  bronchoscopy is in process.  08/08/23- patient is extubated and is able to speak few words with  family.  She moved her feet and right hand today.  I met with husband and son at bedside reviewed imaging and bloodwork. 08/09/23- patient had severe event overnight with hypoxemia , CXR This am with complete opacification of left lung again.  She went into respiratory distress overnight with severe hypoxemia.  She was emergently intubated overnight. WE notified familly and discussed events and imaging findings. She is s/p bronch with improved CXR post procedure.  08/10/23 - remains weak and minimally responsive, flaccid upper and lower extremities 08/11/23 - MRI performed overnight. Remains minimally responsive but does open eyes and track. PEG tube placed by surgery 08/12/23 - acute drop in hemoglobin. Mental status unchanged 08/13/23 - non-responsive. Remains weak, but moving upper extremity non-purposefully 08/14/23 - opens eyes, moves arms non-purposefully 08/15/23 - opens eyes, moving upper extremities, doesn't follow commands 08/16/23 - off neo, on vaso. Eyes open, moving upper and lower extremities non-purposefully 08/17/23 - s/p Trach placement. Doing well remains on the ventilator.   08/19/2023 weaning down her vent currently on PSV 10/8.  08/20/2023 Unable to tolerate PSV for longer than 2 hours today. Repeat CXR with near opacifications of the left lung. [place] at bedside with a large pleural effusion. Pending CT chest.   Interim History / Subjective:  Awake and alert this AM, following commands.  CT chest yesterday with LM mucous plug and moderate left pleural effusion.  Unable to tolerate PSV today. Will plan on bronchoscopy for airway clearance and left sided thoracentesis.   Objective   Blood pressure (!) 93/43, pulse (!) 57, temperature (!) 97.5 F (36.4 C), temperature source Oral, resp. rate (!) 23, height 5\' 7"  (1.702 m), weight 70.2 kg, SpO2 93%.    Vent Mode: PRVC FiO2 (%):  [40 %] 40 % Set Rate:  [16 bmp] 16 bmp Vt Set:  [380 mL] 380 mL PEEP:  [8 cmH20] 8 cmH20 Plateau Pressure:  [22  cmH20-26 cmH20] 25 cmH20   Intake/Output Summary (Last 24 hours) at 08/21/2023 0919 Last data filed at 08/21/2023 0800 Gross per 24 hour  Intake 867.67 ml  Output 1145 ml  Net -277.33 ml   Filed Weights   08/19/23 0410 08/20/23 0401 08/21/23 0500  Weight: 70.5 kg 70.5 kg 70.2 kg    Examination: General: Awake and alert. Following commands.  HENT: s/p Trach tube placement. Shiley size 6.  Lungs: Coarse breath sounds bilaterally.  Cardiovascular: Normal S1, Normal S2, RRR Abdomen: Soft, non tender, non distended, +BS. G tube in place.  Extremities: Warm and well perfused.   Labs and imaging were reviewed.   Assessment & Plan:  Jeily Guthridge is a 68 year old old female with PMHx of hypothyroidism, insulin-dependent DM, CKD stage III, dysphagia with esophageal strictures, facial cellulitis, GERD, neuropathy, arthritis, blood dyscrasia, and recent traumatic cervical central cord injury who presented to the hospital for elective cervical spine surgery. S/p C2 dome laminectomy on 01/21. Course c/p by epidural hematoma s/p evacuation and cervical and thoracic laminectomy on 01/25.  Courser further comlicated by hypoxic respiratory failure and inability to protect the her airways now s/p Trach on 02/10. S/p PEG tube placement 02/04.   #Acute hypoxic respiratory failure s/p trach placement 02/10 secondary to... #Aspiration pneumonia and inability to protect airways in the setting of...  #C2 dome laminectomy 01/21 c/n epidural hematoma s/p decompression and cervical and thoracic laminectomy on 01/25.  #c/b Autonomic dysfunction now on  minimal dose vasopressin  #Left lower lobe collapse - CXR and bedside US with large effusion. Pending CT chest.  #Anemia with Hgb downtrending now at 8.8mg /dl #AKI on CKD stage IIIb likely in the setting of pre renal azotemia Cr improving now at 1.5mg /dl.   Neuro: Delirium precautions.  CVS: C/w home Midodrine and fludrocortisone. MAP > 65.   Lungs: Wean down  vent support for SpO2 > 90%. C/w PSV and wean as tolerated. CXR today. Plan for bronchoscopy today and thoracentesis.  GI: cw TF. Lax PRN. Ensure bowel movement. PPI for prophylaxis.  Endo: POC 140-180. C/w home levothyroxine. Heme: chemical DVT prophylaxis. Renal: Avoid nephrotox agents.   Dispo: Will start referral to [facility].   Best Practice (right click and "Reselect all SmartList Selections" daily)   Diet/type: tubefeeds DVT prophylaxis SCD Pressure ulcer(s): N/A GI prophylaxis: PPI Lines: Peripheral IV Foley:  removal ordered  Had to be placed again today.  Code Status:  full code  Last date of multidisciplinary goals of care discussion [08/17/2023]  I spent 40 minutes caring for this patient today, including preparing to see the patient, obtaining a medical history , reviewing a separately obtained history, performing a medically appropriate examination and/or evaluation, ordering medications, tests, or procedures, documenting clinical information in the electronic health record, and independently interpreting results (not separately reported/billed) and communicating results to the patient/family/caregiver  Janann Colonel, MD [facility] 08/21/2023 9:19 AM

## 2023-08-21 NOTE — Progress Notes (Signed)
Tracheostoma has some bleeding yesterday per nursing.  Dried blood present on gauze today.  Gauze removed and replaced inferiorly.  No active bleeding noted today.  Patient with history of blood dyscrasia and is at high risk of intermittent bleeding from open wound especially when she is more awake and moving.  Recommend removal of sutures on POD#5 with his over the weekend.  Continue routine tracheostomy care.

## 2023-08-21 NOTE — Discharge Summary (Signed)
Physician Discharge Summary  Patient ID: Cesily Cuoco MRN: [id number] DOB/AGE: [birth year] 66 y.o.  Admit date: 07/28/2023 Discharge date: 08/21/2023   Brief Pt Description / Synopsis:  Drusilla Wampole is a 68 year old old female with PMHx of hypothyroidism, insulin-dependent DM, CKD stage III, dysphagia with esophageal strictures, facial cellulitis, GERD, neuropathy, arthritis, blood dyscrasia, and recent traumatic cervical central cord injury who presented to the hospital for elective cervical spine surgery. S/p C2 dome laminectomy on 01/21. Course c/p by epidural hematoma s/p evacuation and cervical and thoracic laminectomy on 01/25. Courser further comlicated by hypoxic respiratory failure and inability to protect the her airways now s/p Trach on 02/10. S/p PEG tube placement 02/04.    Discharge Diagnoses:   #Acute hypoxic respiratory failure s/p trach placement 02/10 secondary to... #Aspiration pneumonia and inability to protect airways in the setting of...  #C2 dome laminectomy 01/21 c/n epidural hematoma s/p decompression and cervical and thoracic laminectomy on 01/25.  #c/b Autonomic dysfunction now on minimal dose vasopressin  #Left lower lobe collapse - CXR and bedside US with large effusion. Pending CT chest.  #Anemia with Hgb downtrending now at 8.8mg /dl #AKI on CKD stage IIIb likely in the setting of pre renal azotemia Cr improving now at 1.5mg /dl.  #Hyperkalemia #Acute Metabolic Encephalopathy #Delirium                                                            Discharge Summary:  Yaretzy Olazabal is a 68 year old old female with PMHx of hypothyroidism, insulin-dependent DM, CKD stage III, dysphagia with esophageal strictures, facial cellulitis, GERD, neuropathy, arthritis, blood dyscrasia, and recent traumatic cervical central cord injury who presented to the hospital for elective cervical spine surgery.   Per patient's chart, patient was seen by neurosurgery on 07/28/2023 for  follow-up after suffering a fall 3 months ago in which she sustained a cervical spinal cord injury.  Per patient, they deferred emergent surgical depression and wanted to let her have time to recover and work with physical therapy.  She continued to have hand weakness as well as lower extremity weakness and ambulatory issues.  Given the ongoing compression of C3-C6, laminoplasty and C2 dome laminectomy was indicated.     Hospital course: Patient underwent C3-6 laminoplasty and C2 dome and laminectomy surgery on 07/29/2023 with drain placement.   Pertinent Labs/Diagnostics Findings: Na+/ K+: 141/4.4 Glucose: 136 BUN/Cr.47/1.42 WBC: 5.8 K/L  Hgb/Hct: 7.4/22.4   Please see "Significant Hospital Events" section below for full detailed hospital course.   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 ~ RESOLVED -Continuous cardiac monitoring -Maintain MAP >65 -IV fluids -Vasopressors as needed to maintain MAP goal ~ weaned off -Continue Midodrine and Fludrocortisone  #Aspiration Pneumonia ~ TREATED -Monitor fever curve -Trend WBC's  -Follow cultures as above -Continue course of ABX as above  #Anemia without s/sx of bleeding -Monitor for S/Sx of bleeding -  Trend CBC -Heparin SQ for VTE Prophylaxis  -Transfuse for Hgb <7  #AKI on CKD Stage IIIb #Hyperkalemia -Monitor I&O's / urinary output -Follow BMP -Ensure adequate renal perfusion -Avoid nephrotoxic agents as  able -Replace electrolytes as indicated ~ Pharmacy following for assistance with electrolyte replacement -Continue Lokelma -Nephrology following,appreciate input  #C2 dome laminectomy 01/21 c/n epidural hematoma s/p decompression and cervical and thoracic laminectomy on 01/25.  -Neurosurgery following, appreciate input  #Acute Metabolic Encephalopathy #Delirium -Treatment of metabolic derangements as outlined above -Provide supportive care -Promote normal sleep/wake cycle and family presence -Avoid sedating medications as able     Significant Events:  01/21: Patient underwent posterior C3-6 Cervical Laminoplasty with C2 inferior dome Laminectomy 01/22: POD #1. Pt. stable with no acute changes.  Working with PT and OT 01/23: POD #2.  Pt. noted to be very lethargic and difficult to arouse 01/24: POD #3. Pt. developed hypoxia tachycardia and shortness of breath.  Hospitalist consulted CT chest obtained and showed possible pneumonia.  Started on IV antibiotics and DuoNebs. 01/25: POD #4. Pt. with new concerns of not moving the left arm.  CT head and cervical spine obtained with no new concerns.  Follow-up MRI however demonstrated epidural hematoma extending from the cervical spine to the lower thoracic spine with acute compression and T2 signal.  Taken emergently to the OR for surgical decompression 01/26: Pt. transferred to the ICU s/p emergent cervical decompression and evacuation of epidural hematoma.  Remained intubated. PCCM consulted 08/03/23- s/p surgery overnight, alert to self but drowsy with mild AKI and metabolic acidosis.  Lethargy noted have dcd cefepime and started doxycycline instead.  08/04/23- patient more alert able to wiggle toes to verbal.  Remains critically ill.  08/05/23- patient appears to have hypoactive delerium with waxing and waning sensorium.  At times she asks for things other times she's poorly responsive.  Her oxygenation has improved with nasal canula 5L/min.  S/p  heme/onc evaluation today 08/06/23- patient seen at bedside, she was able to whisper few words tome.  I discussed PO intake with speech pathologist and SLP with barium was ordered however patient unable to complete this study. She is still very weak physically barely able to wiggle toes.  H/h <7 s/p prbc.  Renal function is improved.  08/07/23- patient had worsening hypoxemia with CXR showing worsening complete left opacification. I discussed this with neurosurgery and they stated her cervical spine did not have any new hardware so intubation should not be complicated. I met with PA Manning Charity at bedside  to review care plan. We discussed these findings with husband and proceeded with intubation and bronchoscopy.  The entire left lung appeared completely full of mucus impaction in both upper and lower lobes during bronchoscopy.  CXR post bronchoscopy is in process.  08/08/23- patient is extubated and is able to speak few words with family.  She moved her feet and right hand today.  I met with husband and son at bedside reviewed imaging and bloodwork. 08/09/23- patient had severe event overnight with hypoxemia , CXR This am with complete opacification of left lung again.  She went into respiratory distress overnight with severe hypoxemia.  She was emergently intubated overnight. WE notified familly and discussed events and imaging findings. She is s/p bronch with improved CXR post procedure.  08/10/23 - remains weak and minimally responsive, flaccid upper and lower extremities 08/11/23 - MRI performed overnight. Remains minimally responsive but does open eyes and track. PEG tube placed by surgery 08/12/23 - acute  drop in hemoglobin. Mental status unchanged 08/13/23 - non-responsive. Remains weak, but moving upper extremity non-purposefully 08/14/23 - opens eyes, moves arms non-purposefully 08/15/23 - opens eyes, moving upper extremities, doesn't follow commands 08/16/23 - off neo, on vaso. Eyes open, moving upper and lower  extremities non-purposefully 08/17/23 - s/p Trach placement. Doing well remains on the ventilator.   08/19/2023 weaning down her vent currently on PSV 10/8.  08/20/2023 Unable to tolerate PSV for longer than 2 hours today. Repeat CXR with near opacifications of the left lung. [place] at bedside with a large pleural effusion. Pending CT chest.  08/21/23: Discharge to [facility]                 Significant Diagnostic Studies:  2/3: MRI HEAD: 1.  Negative for an acute infarct. 2. Small area of susceptibility artifact in the left insular region is nonspecific and may represent sequela of chronic hemorrhage. Recommend short term follow up head CT to exclude the possibility of subarachnoid hemorrhage. 2/3: MRI CERVICAL AND THORACIC SPINE: 1. Interval evacuation of the previously seen dorsal epidural hematoma in the cervical and thoracic spine. No evidence of residual high grade spinal canal stenosis. There are residual blood products spanning the T6-T9 level. 2. New T2 hyperintense cord signal abnormality spanning involving nearly the entire cervical and thoracic spinal cord, which is concerning for spinal cord infarct and/or edema. DWI sequences of the cervical and thoracic spine could be obtained for further characterization. 3. Nonspecific fluid along the laminectomy site that likely communicates with the skin surface. Correlate for clinical signs of infection. 2/3: MRI LUMBAR SPINE: No acute fracture or traumatic malalignment of the lumbar spine. 2/4: MRI Cervical/Thoracic/Lumbar Spine>>  IMPRESSION: 1. Diffuse cord signal abnormality extending from the cervicomedullary junction to approximately the T11-12 level, corresponding with abnormality seen on prior MRI from 08/10/2023. Region of signal abnormality demonstrates facilitated diffusion, with no visible restricted diffusion to suggest acute spinal cord infarct. Given this, these findings are felt to be most consistent with diffuse spinal  cord injury related to previously identified epidural hematoma and cord compression. 2. Postoperative changes from prior C3-C6 laminoplasty with C2 dome laminectomy, followed by subsequent epidural evacuation. 2/6: Venous US RUE>>IMPRESSION: 1. No evidence of DVT or superficial thrombophlebitis within the RIGHT upper extremity. 2. PICC present within the basilic vein. 2/5: CT Abdomen & Pelvis>>IMPRESSION: 1. New free fluid in the abdomen and pelvis, including adjacent to the spleen, and Moderate Volume overall. Hemoperitoneum cannot be excluded, although the fluid has fairly simple density favoring ascites. 2. Postoperative day 1 from surgical G-tube placement, no gastrostomy tube adverse features. Small volume postoperative pneumoperitoneum. 3. No retroperitoneal or other acute hemorrhage identified in the noncontrast abdomen or pelvis. 4. Unresolved left lung base pneumonia. Stable small left pleural effusion. Cholelithiasis. Large bowel diverticulosis. Chronic left renal atrophy. 2/13: CT Chest w/o contrast>>IMPRESSION: Bilateral pleural effusions, left greater than right. Continued consolidation in the left lower lobe and posterior left upper lobe compatible with pneumonia.Coronary artery disease. Aortic Atherosclerosis            Micro Data:  1/25: MRSA PCR>> positive 1/31: BAL>> Candida Glabrata  1/26: Strep pneumo urinary antigen>>negative 2/2: BAL>>Candida Glabrata 2/3: Blood cultures x2>> no growth 2/8: Tracheal aspirate>>Candida albicans   Antimicrobials:   Anti-infectives (From admission, onward)    Start     Dose/Rate Route Frequency Ordered Stop   08/10/23 1600  cefTAZidime (FORTAZ) 2 g in sodium chloride 0.9 % 100 mL IVPB  2 g 200 mL/hr over 30 Minutes Intravenous Every 12 hours 08/10/23 1344 08/16/23 1826   08/10/23 1500  vancomycin (VANCOREADY) IVPB 1500 mg/300 mL        1,500 mg 150 mL/hr over 120 Minutes Intravenous  Once 08/10/23 1331 08/10/23  1747   08/10/23 1331  vancomycin variable dose per unstable renal function (pharmacist dosing)  Status:  Discontinued         Does not apply See admin instructions 08/10/23 1331 08/12/23 1105   08/07/23 1800  metroNIDAZOLE (FLAGYL) IVPB 500 mg  Status:  Discontinued        500 mg 100 mL/hr over 60 Minutes Intravenous Every 12 hours 08/07/23 1240 08/09/23 1104   08/07/23 1400  cefTRIAXone (ROCEPHIN) 1 g in sodium chloride 0.9 % 100 mL IVPB  Status:  Discontinued        1 g 200 mL/hr over 30 Minutes Intravenous Every 24 hours 08/07/23 1240 08/10/23 1106   08/03/23 1800  doxycycline (VIBRAMYCIN) 100 mg in sodium chloride 0.9 % 250 mL IVPB  Status:  Discontinued        100 mg 125 mL/hr over 120 Minutes Intravenous Every 12 hours 08/03/23 1153 08/10/23 1329   08/02/23 2200  linezolid (ZYVOX) IVPB 600 mg  Status:  Discontinued        600 mg 300 mL/hr over 60 Minutes Intravenous Every 12 hours 08/02/23 1624 08/02/23 1624   08/02/23 1200  vancomycin (VANCOCIN) IVPB 1000 mg/200 mL premix  Status:  Discontinued        1,000 mg 200 mL/hr over 60 Minutes Intravenous Every 24 hours 08/02/23 1042 08/03/23 1104   08/01/23 2000  vancomycin (VANCOREADY) IVPB 750 mg/150 mL  Status:  Discontinued        750 mg 150 mL/hr over 60 Minutes Intravenous Every 24 hours 07/31/23 1750 08/01/23 1140   08/01/23 2000  vancomycin (VANCOCIN) 750 mg in sodium chloride 0.9 % 250 mL IVPB  Status:  Discontinued        750 mg 250 mL/hr over 60 Minutes Intravenous Every 24 hours 08/01/23 1140 08/02/23 1042   07/31/23 2000  vancomycin (VANCOREADY) IVPB 1250 mg/250 mL        1,250 mg 166.7 mL/hr over 90 Minutes Intravenous  Once 07/31/23 1750 07/31/23 2200   07/31/23 1800  azithromycin (ZITHROMAX) 500 mg in sodium chloride 0.9 % 250 mL IVPB  Status:  Discontinued        500 mg 250 mL/hr over 60 Minutes Intravenous Every 24 hours 07/31/23 1643 07/31/23 1754   07/31/23 1800  metroNIDAZOLE (FLAGYL) IVPB 500 mg  Status:   Discontinued        500 mg 100 mL/hr over 60 Minutes Intravenous Every 12 hours 07/31/23 1645 08/03/23 1104   07/31/23 1800  ceFEPIme (MAXIPIME) 2 g in sodium chloride 0.9 % 100 mL IVPB  Status:  Discontinued        2 g 200 mL/hr over 30 Minutes Intravenous Every 12 hours 07/31/23 1750 08/03/23 1104   07/28/23 1600  vancomycin (VANCOCIN) IVPB 1000 mg/200 mL premix        1,000 mg 200 mL/hr over 60 Minutes Intravenous  Once 07/28/23 1458 07/28/23 1820   07/28/23 0615  ceFAZolin (ANCEF) IVPB 2g/100 mL premix        2 g 200 mL/hr over 30 Minutes Intravenous  Once 07/28/23 0606 07/28/23 0736   07/28/23 0615  vancomycin (VANCOCIN) IVPB 1000 mg/200 mL premix        1,000  mg 200 mL/hr over 60 Minutes Intravenous  Once 07/28/23 0606 07/28/23 1459        Consults:  PCCM Neurosurgery Nephrology ENT Palliative Care   Discharge Exam:   General: Awake and alert. Following commands.  HENT: s/p Trach tube placement. Shiley size 6.  Lungs: Coarse breath sounds bilaterally.  Cardiovascular: Normal S1, Normal S2, RRR Abdomen: Soft, non tender, non distended, +BS. G tube in place.  Extremities: Warm and well perfused.   Vitals:   08/21/23 1000 08/21/23 1100 08/21/23 1200 08/21/23 1300  BP: (!) 106/56 106/82 (!) 109/51 (!) 116/50  Pulse: (!) 54 62 (!) 54 (!) 59  Resp: (!) 26 (!) 27 (!) 27 (!) 25  Temp:   (!) 97.5 F (36.4 C)   TempSrc:   Oral   SpO2: 96% 95% 94% 96%  Weight:      Height:         Discharge Labs:   BMET Recent Labs  Lab 08/15/23 0429 08/16/23 0330 08/17/23 0445 08/18/23 0452 08/18/23 0841 08/20/23 0722 08/21/23 0611  NA 148* 147* 141  --  142 147* 144  K 4.6 4.9 4.8  --  4.5 4.9 5.9*  CL 112* 112* 107  --  108 113* 111  CO2 26 24 25   --  23 25 27   GLUCOSE 274* 241* 325*  --  201* 151* 260*  BUN 83* 82* 73*  --  80* 86* 92*  CREATININE 1.68* 1.66* 1.55* 1.58* 1.50* 1.69* 1.79*  CALCIUM 7.8* 8.0* 7.6*  --  7.2* 7.9* 7.8*  MG 2.2  --   --  2.1  --  2.3   --   PHOS 2.6 1.7* 2.5 3.6  --  2.7 3.3    CBC Recent Labs  Lab 08/19/23 0548 08/20/23 0722 08/21/23 0611  HGB 9.4* 9.5* 9.7*  HCT 29.8* 31.1* 30.5*  WBC 5.2 4.7 8.0  PLT 115* 100* 106*    Anti-Coagulation Recent Labs  Lab 08/14/23 1336  INR 1.4*    Discharge Instructions     Incentive spirometry RT   Complete by: As directed          Follow-up Information     Joan Flores, PA-C Follow up on 08/14/2023.   Specialty: Physician Assistant Contact information: [address] [place], [place] [address] [place] [address] [phone]                  Allergies as of 08/21/2023       Reactions   Amoxicillin-pot Clavulanate Itching, Anaphylaxis   Ampicillin Rash        Medication List     STOP taking these medications    acetaminophen 650 MG CR tablet Commonly known as: TYLENOL Replaced by: acetaminophen 325 MG tablet   cyanocobalamin 1000 MCG tablet Commonly known as: VITAMIN B12   cyclobenzaprine 10 MG tablet Commonly known as: FLEXERIL   diclofenac Sodium 1 % Gel Commonly known as: VOLTAREN   gabapentin 100 MG capsule Commonly known as: NEURONTIN Replaced by: gabapentin 250 MG/5ML solution   glipiZIDE 5 MG tablet Commonly known as: GLUCOTROL   Jardiance 25 MG Tabs tablet Generic drug: empagliflozin   lactase 3000 units tablet Commonly known as: LACTAID   METAMUCIL 3 IN 1 DAILY FIBER PO   multivitamin with minerals Tabs tablet   pioglitazone 30 MG tablet Commonly known as: ACTOS   rosuvastatin 5 MG tablet Commonly known as: CRESTOR   senna 8.6 MG Tabs tablet Commonly known as: [employer]  TAKE these medications    acetaminophen 325 MG tablet Commonly known as: TYLENOL Place 2 tablets (650 mg total) into feeding tube every 6 (six) hours as needed for fever. Replaces: acetaminophen 650 MG CR tablet   bisacodyl 10 MG suppository Commonly known as: DULCOLAX Place 1 suppository (10 mg total) rectally daily as needed for  moderate constipation.   chlorhexidine 4 % external liquid Commonly known as: HIBICLENS Apply 15 mLs (1 Application total) topically as directed for 30 doses. Use as directed daily for 5 days every other week for 6 weeks.   Chlorhexidine Gluconate Cloth 2 % Pads Apply 6 each topically daily.   docusate 50 MG/5ML liquid Commonly known as: COLACE Place 10 mLs (100 mg total) into feeding tube 2 (two) times daily as needed for mild constipation.   esomeprazole 40 MG injection Commonly known as: NEXIUM Inject 40 mg into the vein daily before breakfast.   feeding supplement (OSMOLITE 1.5 CAL) Liqd Place 1,000 mLs into feeding tube continuous.   nutrition supplement (JUVEN) Pack Place 1 packet into feeding tube 2 (two) times daily between meals.   feeding supplement (PROSource TF20) liquid Place 60 mLs into feeding tube daily. Start taking on: August 22, 2023   fentaNYL 50 MCG/ML injection Commonly known as: SUBLIMAZE Inject 0.5-2 mLs (25-100 mcg total) into the vein every 30 (thirty) minutes as needed (to maintain RASS & CPOT goal.).   fludrocortisone 0.1 MG tablet Commonly known as: FLORINEF Place 2 tablets (0.2 mg total) into feeding tube daily. Start taking on: August 22, 2023 What changed:  how to take this additional instructions   FLUoxetine 20 MG capsule Commonly known as: PROZAC Place 1 capsule (20 mg total) into feeding tube daily. Start taking on: August 22, 2023   free water Soln Place 200 mLs into feeding tube every 6 (six) hours.   gabapentin 250 MG/5ML solution Commonly known as: NEURONTIN Place 4 mLs (200 mg total) into feeding tube every 12 (twelve) hours. Replaces: gabapentin 100 MG capsule   heparin 5000 UNIT/ML injection Inject 1 mL (5,000 Units total) into the skin every 8 (eight) hours.   insulin aspart 100 UNIT/ML injection Commonly known as: novoLOG Inject 0-15 Units into the skin every 6 (six) hours.   insulin aspart 100 UNIT/ML  injection Commonly known as: novoLOG Inject 5 Units into the skin every 6 (six) hours.   insulin glargine-yfgn 100 UNIT/ML injection Commonly known as: SEMGLEE Inject 0.15 mLs (15 Units total) into the skin daily. Start taking on: August 22, 2023   ipratropium-albuterol 0.5-2.5 (3) MG/3ML Soln Commonly known as: DUONEB Take 3 mLs by nebulization every 4 (four) hours as needed.   levothyroxine 88 MCG tablet Commonly known as: SYNTHROID Place 1 tablet (88 mcg total) into feeding tube daily before breakfast. Start taking on: August 22, 2023 What changed: how to take this   menthol-cetylpyridinium 3 MG lozenge Commonly known as: CEPACOL Take 1 lozenge (3 mg total) by mouth as needed for sore throat.   midodrine 10 MG tablet Commonly known as: PROAMATINE Place 1 tablet (10 mg total) into feeding tube 3 (three) times daily with meals. What changed:  how much to take how to take this additional instructions   mouth rinse Liqd solution 15 mLs by Mouth Rinse route every 2 (two) hours.   mouth rinse Liqd solution 15 mLs by Mouth Rinse route as needed (oral care).   mupirocin ointment 2 % Commonly known as: BACTROBAN Place 1 Application into the nose 2 (  two) times daily for 60 doses. Use as directed 2 times daily for 5 days every other week for 6 weeks.   polyethylene glycol 17 g packet Commonly known as: MIRALAX / GLYCOLAX Place 17 g into feeding tube daily as needed for moderate constipation.   sodium chloride flush 0.9 % Soln Commonly known as: NS 10-40 mLs by Intracatheter route every 12 (twelve) hours.   sodium chloride flush 0.9 % Soln Commonly known as: NS 10-40 mLs by Intracatheter route as needed (flush).   sodium chloride flush 0.9 % Soln Commonly known as: NS Inject 3-10 mLs into the vein every 12 (twelve) hours.   sodium chloride flush 0.9 % Soln Commonly known as: NS Inject 3-10 mLs into the vein as needed.   sodium chloride HYPERTONIC 3 % nebulizer  solution Take 4 mLs by nebulization every 8 (eight) hours.            Disposition: LTACH  Discharged Condition: Codee Bloodworth has met maximum benefit of inpatient care and requires prolonged ventilator support and care.  Time spent on disposition:  50 Minutes.     Signed: Harlon Ditty, AGACNP-BC [facility] Prefer epic messenger for cross cover needs If after hours, please call E-link

## 2023-08-21 NOTE — Progress Notes (Signed)
Care link arrived to transport patients to [facility]; patient transferred to stretcher and departed with cardiac monitoring and continues on ventilation.  Patient's family at bedside during this process, patient had all belongings upon leaving with care link.

## 2023-08-21 NOTE — Plan of Care (Signed)
Continuing with plan of care.

## 2023-08-21 NOTE — Inpatient Diabetes Management (Signed)
Inpatient Diabetes Program Recommendations  AACE/ADA: New Consensus Statement on Inpatient Glycemic Control (2015)  Target Ranges:  Prepandial:   less than 140 mg/dL      Peak postprandial:   less than 180 mg/dL (1-2 hours)      Critically ill patients:  140 - 180 mg/dL    Latest Reference Range & Units 08/19/23 23:24 08/20/23 03:24 08/20/23 05:43 08/20/23 13:26 08/20/23 17:56  Glucose-Capillary 70 - 99 mg/dL 829 (H)  8 units Novolog  123 (H) 153 (H)  8 units Novolog  15 units Semglee @0907   200 (H)  8 units Novolog  152 (H)  8 units Novolog   (H): Data is abnormally high  Latest Reference Range & Units 08/21/23 00:14 08/21/23 05:59  Glucose-Capillary 70 - 99 mg/dL 562 (H)  10 units Novolog  214 (H)  10 units Novolog   (H): Data is abnormally high      Current Orders: Novolog Moderate Correction Scale/ SSI (0-15 units) Q6 hours     Novolog 5 units Q6 hours     Semglee 15 units daily    Getting Florinef 0.2 mg Daily Osmolite Tube Feeds 60cc/hr    MD- May consider increasing the Frequency of both the Novolog SSI and the Novolog Tube Feed Coverage to Q4 hours    --Will follow patient during hospitalization--  Ambrose Finland RN, MSN, CDCES Diabetes Coordinator Inpatient Glycemic Control Team Team Pager: [phone] (8a-5p)

## 2023-08-21 NOTE — Plan of Care (Signed)
Patient transferred to [facility].

## 2023-08-21 NOTE — Progress Notes (Signed)
Report given to receiving nurse, Julieanne Cotton.

## 2023-08-24 DIAGNOSIS — E039 Hypothyroidism, unspecified: Secondary | ICD-10-CM | POA: Diagnosis not present

## 2023-08-24 DIAGNOSIS — G9340 Encephalopathy, unspecified: Secondary | ICD-10-CM | POA: Diagnosis not present

## 2023-08-24 DIAGNOSIS — Z930 Tracheostomy status: Secondary | ICD-10-CM | POA: Diagnosis not present

## 2023-08-24 DIAGNOSIS — J9621 Acute and chronic respiratory failure with hypoxia: Secondary | ICD-10-CM | POA: Diagnosis not present

## 2023-08-24 DIAGNOSIS — I959 Hypotension, unspecified: Secondary | ICD-10-CM | POA: Diagnosis not present

## 2023-08-24 DIAGNOSIS — R1310 Dysphagia, unspecified: Secondary | ICD-10-CM | POA: Diagnosis not present

## 2023-08-24 DIAGNOSIS — E119 Type 2 diabetes mellitus without complications: Secondary | ICD-10-CM | POA: Diagnosis not present

## 2023-08-25 DIAGNOSIS — Z930 Tracheostomy status: Secondary | ICD-10-CM | POA: Diagnosis not present

## 2023-08-25 DIAGNOSIS — R1310 Dysphagia, unspecified: Secondary | ICD-10-CM | POA: Diagnosis not present

## 2023-08-25 DIAGNOSIS — E875 Hyperkalemia: Secondary | ICD-10-CM | POA: Diagnosis not present

## 2023-08-25 DIAGNOSIS — J9621 Acute and chronic respiratory failure with hypoxia: Secondary | ICD-10-CM | POA: Diagnosis not present

## 2023-08-25 DIAGNOSIS — E119 Type 2 diabetes mellitus without complications: Secondary | ICD-10-CM | POA: Diagnosis not present

## 2023-08-25 DIAGNOSIS — G9340 Encephalopathy, unspecified: Secondary | ICD-10-CM | POA: Diagnosis not present

## 2023-08-25 DIAGNOSIS — I959 Hypotension, unspecified: Secondary | ICD-10-CM | POA: Diagnosis not present

## 2023-08-25 DIAGNOSIS — E039 Hypothyroidism, unspecified: Secondary | ICD-10-CM | POA: Diagnosis not present

## 2023-08-26 ENCOUNTER — Encounter: Payer: Medicare HMO | Admitting: Neurosurgery

## 2023-08-26 DIAGNOSIS — I959 Hypotension, unspecified: Secondary | ICD-10-CM | POA: Diagnosis not present

## 2023-08-26 DIAGNOSIS — E875 Hyperkalemia: Secondary | ICD-10-CM | POA: Diagnosis not present

## 2023-08-26 DIAGNOSIS — E039 Hypothyroidism, unspecified: Secondary | ICD-10-CM | POA: Diagnosis not present

## 2023-08-26 DIAGNOSIS — E119 Type 2 diabetes mellitus without complications: Secondary | ICD-10-CM | POA: Diagnosis not present

## 2023-08-26 DIAGNOSIS — Z930 Tracheostomy status: Secondary | ICD-10-CM | POA: Diagnosis not present

## 2023-08-26 DIAGNOSIS — J9621 Acute and chronic respiratory failure with hypoxia: Secondary | ICD-10-CM | POA: Diagnosis not present

## 2023-08-26 DIAGNOSIS — R1310 Dysphagia, unspecified: Secondary | ICD-10-CM | POA: Diagnosis not present

## 2023-08-26 DIAGNOSIS — G9340 Encephalopathy, unspecified: Secondary | ICD-10-CM | POA: Diagnosis not present

## 2023-08-27 DIAGNOSIS — Z930 Tracheostomy status: Secondary | ICD-10-CM

## 2023-08-27 DIAGNOSIS — J9621 Acute and chronic respiratory failure with hypoxia: Secondary | ICD-10-CM

## 2023-08-27 DIAGNOSIS — R1310 Dysphagia, unspecified: Secondary | ICD-10-CM

## 2023-08-27 DIAGNOSIS — G9340 Encephalopathy, unspecified: Secondary | ICD-10-CM

## 2023-08-28 DIAGNOSIS — J9621 Acute and chronic respiratory failure with hypoxia: Secondary | ICD-10-CM | POA: Diagnosis not present

## 2023-08-28 DIAGNOSIS — R1310 Dysphagia, unspecified: Secondary | ICD-10-CM | POA: Diagnosis not present

## 2023-08-28 DIAGNOSIS — G9340 Encephalopathy, unspecified: Secondary | ICD-10-CM | POA: Diagnosis not present

## 2023-08-28 DIAGNOSIS — Z930 Tracheostomy status: Secondary | ICD-10-CM | POA: Diagnosis not present

## 2023-08-29 DIAGNOSIS — G9340 Encephalopathy, unspecified: Secondary | ICD-10-CM | POA: Diagnosis not present

## 2023-08-29 DIAGNOSIS — I959 Hypotension, unspecified: Secondary | ICD-10-CM | POA: Diagnosis not present

## 2023-08-29 DIAGNOSIS — E875 Hyperkalemia: Secondary | ICD-10-CM | POA: Diagnosis not present

## 2023-08-29 DIAGNOSIS — E119 Type 2 diabetes mellitus without complications: Secondary | ICD-10-CM | POA: Diagnosis not present

## 2023-08-29 DIAGNOSIS — Z930 Tracheostomy status: Secondary | ICD-10-CM | POA: Diagnosis not present

## 2023-08-29 DIAGNOSIS — E039 Hypothyroidism, unspecified: Secondary | ICD-10-CM | POA: Diagnosis not present

## 2023-08-29 DIAGNOSIS — R1310 Dysphagia, unspecified: Secondary | ICD-10-CM | POA: Diagnosis not present

## 2023-08-29 DIAGNOSIS — J9621 Acute and chronic respiratory failure with hypoxia: Secondary | ICD-10-CM | POA: Diagnosis not present

## 2023-08-30 DIAGNOSIS — I959 Hypotension, unspecified: Secondary | ICD-10-CM | POA: Diagnosis not present

## 2023-08-30 DIAGNOSIS — G9340 Encephalopathy, unspecified: Secondary | ICD-10-CM | POA: Diagnosis not present

## 2023-08-30 DIAGNOSIS — E039 Hypothyroidism, unspecified: Secondary | ICD-10-CM | POA: Diagnosis not present

## 2023-08-30 DIAGNOSIS — R1310 Dysphagia, unspecified: Secondary | ICD-10-CM | POA: Diagnosis not present

## 2023-08-30 DIAGNOSIS — E119 Type 2 diabetes mellitus without complications: Secondary | ICD-10-CM | POA: Diagnosis not present

## 2023-08-30 DIAGNOSIS — E875 Hyperkalemia: Secondary | ICD-10-CM | POA: Diagnosis not present

## 2023-08-30 DIAGNOSIS — J9621 Acute and chronic respiratory failure with hypoxia: Secondary | ICD-10-CM | POA: Diagnosis not present

## 2023-08-30 DIAGNOSIS — Z930 Tracheostomy status: Secondary | ICD-10-CM | POA: Diagnosis not present

## 2023-08-31 DIAGNOSIS — Z930 Tracheostomy status: Secondary | ICD-10-CM | POA: Diagnosis not present

## 2023-08-31 DIAGNOSIS — I959 Hypotension, unspecified: Secondary | ICD-10-CM | POA: Diagnosis not present

## 2023-08-31 DIAGNOSIS — E039 Hypothyroidism, unspecified: Secondary | ICD-10-CM | POA: Diagnosis not present

## 2023-08-31 DIAGNOSIS — J9621 Acute and chronic respiratory failure with hypoxia: Secondary | ICD-10-CM | POA: Diagnosis not present

## 2023-08-31 DIAGNOSIS — E119 Type 2 diabetes mellitus without complications: Secondary | ICD-10-CM | POA: Diagnosis not present

## 2023-08-31 DIAGNOSIS — R1310 Dysphagia, unspecified: Secondary | ICD-10-CM | POA: Diagnosis not present

## 2023-08-31 DIAGNOSIS — G9340 Encephalopathy, unspecified: Secondary | ICD-10-CM | POA: Diagnosis not present

## 2023-09-01 DIAGNOSIS — Z930 Tracheostomy status: Secondary | ICD-10-CM | POA: Diagnosis not present

## 2023-09-01 DIAGNOSIS — J9621 Acute and chronic respiratory failure with hypoxia: Secondary | ICD-10-CM | POA: Diagnosis not present

## 2023-09-01 DIAGNOSIS — G9340 Encephalopathy, unspecified: Secondary | ICD-10-CM | POA: Diagnosis not present

## 2023-09-01 DIAGNOSIS — E039 Hypothyroidism, unspecified: Secondary | ICD-10-CM | POA: Diagnosis not present

## 2023-09-01 DIAGNOSIS — R1310 Dysphagia, unspecified: Secondary | ICD-10-CM | POA: Diagnosis not present

## 2023-09-01 DIAGNOSIS — E119 Type 2 diabetes mellitus without complications: Secondary | ICD-10-CM | POA: Diagnosis not present

## 2023-09-01 DIAGNOSIS — I959 Hypotension, unspecified: Secondary | ICD-10-CM | POA: Diagnosis not present

## 2023-09-02 DIAGNOSIS — I959 Hypotension, unspecified: Secondary | ICD-10-CM | POA: Diagnosis not present

## 2023-09-02 DIAGNOSIS — E039 Hypothyroidism, unspecified: Secondary | ICD-10-CM | POA: Diagnosis not present

## 2023-09-02 DIAGNOSIS — G9340 Encephalopathy, unspecified: Secondary | ICD-10-CM | POA: Diagnosis not present

## 2023-09-02 DIAGNOSIS — Z930 Tracheostomy status: Secondary | ICD-10-CM | POA: Diagnosis not present

## 2023-09-02 DIAGNOSIS — J9621 Acute and chronic respiratory failure with hypoxia: Secondary | ICD-10-CM | POA: Diagnosis not present

## 2023-09-02 DIAGNOSIS — R1310 Dysphagia, unspecified: Secondary | ICD-10-CM | POA: Diagnosis not present

## 2023-09-02 DIAGNOSIS — E119 Type 2 diabetes mellitus without complications: Secondary | ICD-10-CM | POA: Diagnosis not present

## 2023-09-03 DIAGNOSIS — G9340 Encephalopathy, unspecified: Secondary | ICD-10-CM | POA: Diagnosis not present

## 2023-09-03 DIAGNOSIS — Z930 Tracheostomy status: Secondary | ICD-10-CM | POA: Diagnosis not present

## 2023-09-03 DIAGNOSIS — J9621 Acute and chronic respiratory failure with hypoxia: Secondary | ICD-10-CM | POA: Diagnosis not present

## 2023-09-03 DIAGNOSIS — R1310 Dysphagia, unspecified: Secondary | ICD-10-CM | POA: Diagnosis not present

## 2023-09-03 DIAGNOSIS — I959 Hypotension, unspecified: Secondary | ICD-10-CM | POA: Diagnosis not present

## 2023-09-03 DIAGNOSIS — E119 Type 2 diabetes mellitus without complications: Secondary | ICD-10-CM | POA: Diagnosis not present

## 2023-09-03 DIAGNOSIS — E039 Hypothyroidism, unspecified: Secondary | ICD-10-CM | POA: Diagnosis not present

## 2023-09-04 DIAGNOSIS — I959 Hypotension, unspecified: Secondary | ICD-10-CM | POA: Diagnosis not present

## 2023-09-04 DIAGNOSIS — E119 Type 2 diabetes mellitus without complications: Secondary | ICD-10-CM | POA: Diagnosis not present

## 2023-09-04 DIAGNOSIS — E039 Hypothyroidism, unspecified: Secondary | ICD-10-CM | POA: Diagnosis not present

## 2023-09-07 ENCOUNTER — Encounter: Payer: Medicare HMO | Admitting: Neurosurgery

## 2023-09-07 DIAGNOSIS — E039 Hypothyroidism, unspecified: Secondary | ICD-10-CM | POA: Diagnosis not present

## 2023-09-07 DIAGNOSIS — E119 Type 2 diabetes mellitus without complications: Secondary | ICD-10-CM | POA: Diagnosis not present

## 2023-09-07 DIAGNOSIS — I959 Hypotension, unspecified: Secondary | ICD-10-CM | POA: Diagnosis not present

## 2023-09-08 DIAGNOSIS — E119 Type 2 diabetes mellitus without complications: Secondary | ICD-10-CM | POA: Diagnosis not present

## 2023-09-08 DIAGNOSIS — I959 Hypotension, unspecified: Secondary | ICD-10-CM | POA: Diagnosis not present

## 2023-09-08 DIAGNOSIS — E039 Hypothyroidism, unspecified: Secondary | ICD-10-CM | POA: Diagnosis not present

## 2023-09-09 DIAGNOSIS — I959 Hypotension, unspecified: Secondary | ICD-10-CM | POA: Diagnosis not present

## 2023-09-09 DIAGNOSIS — E119 Type 2 diabetes mellitus without complications: Secondary | ICD-10-CM | POA: Diagnosis not present

## 2023-09-09 DIAGNOSIS — E039 Hypothyroidism, unspecified: Secondary | ICD-10-CM | POA: Diagnosis not present

## 2023-09-12 DIAGNOSIS — E119 Type 2 diabetes mellitus without complications: Secondary | ICD-10-CM | POA: Diagnosis not present

## 2023-09-12 DIAGNOSIS — I959 Hypotension, unspecified: Secondary | ICD-10-CM | POA: Diagnosis not present

## 2023-09-12 DIAGNOSIS — E039 Hypothyroidism, unspecified: Secondary | ICD-10-CM | POA: Diagnosis not present

## 2023-09-13 DIAGNOSIS — E119 Type 2 diabetes mellitus without complications: Secondary | ICD-10-CM | POA: Diagnosis not present

## 2023-09-13 DIAGNOSIS — E039 Hypothyroidism, unspecified: Secondary | ICD-10-CM | POA: Diagnosis not present

## 2023-09-13 DIAGNOSIS — I959 Hypotension, unspecified: Secondary | ICD-10-CM | POA: Diagnosis not present

## 2023-09-14 DIAGNOSIS — E119 Type 2 diabetes mellitus without complications: Secondary | ICD-10-CM | POA: Diagnosis not present

## 2023-09-14 DIAGNOSIS — I959 Hypotension, unspecified: Secondary | ICD-10-CM | POA: Diagnosis not present

## 2023-09-14 DIAGNOSIS — E039 Hypothyroidism, unspecified: Secondary | ICD-10-CM | POA: Diagnosis not present

## 2023-09-15 DIAGNOSIS — I959 Hypotension, unspecified: Secondary | ICD-10-CM | POA: Diagnosis not present

## 2023-09-15 DIAGNOSIS — E119 Type 2 diabetes mellitus without complications: Secondary | ICD-10-CM | POA: Diagnosis not present

## 2023-09-15 DIAGNOSIS — E039 Hypothyroidism, unspecified: Secondary | ICD-10-CM | POA: Diagnosis not present

## 2023-09-15 NOTE — Progress Notes (Deleted)
 Subjective:    Patient ID: [name], female    DOB: September 28, 1955, 68 y.o.   MRN: [id number]  HPI   Pain Inventory Average Pain {NUMBERS; 0-10:5044} Pain Right Now {NUMBERS; 0-10:5044} My pain is {PAIN DESCRIPTION:21022940}  LOCATION OF PAIN  ***  BOWEL Number of stools per week: *** Oral laxative use {YES/NO:21197} Type of laxative *** Enema or suppository use {YES/NO:21197} History of colostomy {YES/NO:21197} Incontinent {YES/NO:21197}  BLADDER {bladder options:24190} In and out cath, frequency *** Able to self cath {YES/NO:21197} Bladder incontinence {YES/NO:21197} Frequent urination {YES/NO:21197} Leakage with coughing {YES/NO:21197} Difficulty starting stream {YES/NO:21197} Incomplete bladder emptying {YES/NO:21197}   Mobility {MOBILITY GEX:52841324}  Function {FUNCTION:21022946}  Neuro/Psych {NEURO/PSYCH:21022948}  Prior Studies {CPRM PRIOR STUDIES:21022953}  Physicians involved in your care {CPRM PHYSICIANS INVOLVED IN YOUR CARE:21022954}   Family History  Problem Relation Age of Onset   Breast cancer Maternal Aunt    Hypertension Mother    Hypertension Father    Diabetes Father    Social History   Socioeconomic History   Marital status: Married    Spouse name: Marcial Pacas   Number of children: Not on file   Years of education: Not on file   Highest education level: Not on file  Occupational History   Not on file  Tobacco Use   Smoking status: Never   Smokeless tobacco: Never  Vaping Use   Vaping status: Never Used  Substance and Sexual Activity   Alcohol use: No   Drug use: No   Sexual activity: Not Currently  Other Topics Concern   Not on file  Social History Narrative   Not on file   Social Drivers of Health   Financial Resource Strain: Not on file  Food Insecurity: No Food Insecurity (07/28/2023)   Hunger Vital Sign    Worried About Running Out of Food in the Last Year: Never true    Ran Out of Food in the Last  Year: Never true  Transportation Needs: No Transportation Needs (07/28/2023)   PRAPARE - Administrator, Civil Service (Medical): No    Lack of Transportation (Non-Medical): No  Physical Activity: Not on file  Stress: Not on file  Social Connections: Moderately Integrated (07/28/2023)   Social Connection and Isolation Panel [NHANES]    Frequency of Communication with Friends and Family: Once a week    Frequency of Social Gatherings with Friends and Family: Once a week    Attends Religious Services: 1 to 4 times per year    Active Member of Clubs or Organizations: Yes    Attends Banker Meetings: 1 to 4 times per year    Marital Status: Married   Past Surgical History:  Procedure Laterality Date   ABDOMINAL HYSTERECTOMY     BILATERAL SALPINGECTOMY Bilateral 03/16/2017   Procedure: BILATERAL SALPINGECTOMY;  Surgeon: Schermerhorn, Ihor Austin, MD;  Location: [facility];  Service: Gynecology;  Laterality: Bilateral;   BIOPSY  05/03/2023   Procedure: BIOPSY;  Surgeon: Benancio Deeds, MD;  Location: [facility] ENDOSCOPY;  Service: Gastroenterology;;   COLONOSCOPY WITH PROPOFOL N/A 08/18/2018   Procedure: COLONOSCOPY WITH PROPOFOL;  Surgeon: Scot Jun, MD;  Location: [facility] ENDOSCOPY;  Service: Endoscopy;  Laterality: N/A;   CYSTOCELE REPAIR N/A 03/16/2017   Procedure: ANTERIOR REPAIR (CYSTOCELE);  Surgeon: Schermerhorn, Ihor Austin, MD;  Location: [facility];  Service: Gynecology;  Laterality: N/A;   ESOPHAGOGASTRODUODENOSCOPY (EGD) WITH PROPOFOL N/A 08/18/2018   Procedure: ESOPHAGOGASTRODUODENOSCOPY (EGD) WITH PROPOFOL;  Surgeon: Scot Jun, MD;  Location: [facility] ENDOSCOPY;  Service: Endoscopy;  Laterality: N/A;   ESOPHAGOGASTRODUODENOSCOPY (EGD) WITH PROPOFOL Left 05/01/2023   Procedure: ESOPHAGOGASTRODUODENOSCOPY (EGD) WITH PROPOFOL;  Surgeon: Benancio Deeds, MD;  Location: [facility] ENDOSCOPY;  Service: Gastroenterology;  Laterality: Left;   ESOPHAGOGASTRODUODENOSCOPY  (EGD) WITH PROPOFOL N/A 05/03/2023   Procedure: ESOPHAGOGASTRODUODENOSCOPY (EGD) WITH PROPOFOL;  Surgeon: Benancio Deeds, MD;  Location: [facility] ENDOSCOPY;  Service: Gastroenterology;  Laterality: N/A;   GASTROSTOMY N/A 08/11/2023   Procedure: INSERTION OF GASTROSTOMY TUBE;  Surgeon: Leafy Ro, MD;  Location: [facility];  Service: General;  Laterality: N/A;   POSTERIOR CERVICAL LAMINECTOMY N/A 07/28/2023   Procedure: C2 DOME LAMINECTOMY;  Surgeon: [name], MD;  Location: [facility];  Service: Neurosurgery;  Laterality: N/A;   POSTERIOR CERVICAL LAMINECTOMY N/A 08/01/2023   Procedure: POSTERIOR CERVICAL LAMINECTOMY;  Surgeon: [name], MD;  Location: [facility];  Service: Neurosurgery;  Laterality: N/A;   TEAR DUCT PROBING Right    TRACHEOSTOMY TUBE PLACEMENT N/A 08/17/2023   Procedure: TRACHEOSTOMY;  Surgeon: Bud Face, MD;  Location: [facility];  Service: ENT;  Laterality: N/A;   TUBAL LIGATION     VAGINAL HYSTERECTOMY N/A 03/16/2017   Procedure: HYSTERECTOMY VAGINAL;  Surgeon: Suzy Bouchard, MD;  Location: [facility];  Service: Gynecology;  Laterality: N/A;   WISDOM TOOTH EXTRACTION     Past Medical History:  Diagnosis Date   Acute incomplete quadriplegia (HCC)    Acute respiratory failure with hypoxia (HCC)    AKI (acute kidney injury) (HCC)    Arthritis    Blood dyscrasia    patient stated she is a "free bleeder"   Central cord syndrome (HCC)    Dental caries    Diabetes mellitus without complication (HCC)    Diverticulosis    Dysphagia    Esophageal stricture    Facial cellulitis    GERD (gastroesophageal reflux disease)    Hypothyroidism    Neuropathy    Odynophagia    Pelvic fracture (HCC)    Renal insufficiency    Spinal cord compression, post-traumatic (HCC)    Spinal cord injury, cervical region (HCC)    Stage 3a chronic kidney disease (CKD) (HCC)    There were no vitals taken for this visit.  Opioid Risk Score:   Fall Risk Score:   `1  Depression screen [facility] 2/9     06/15/2023   11:41 AM  Depression screen PHQ 2/9  Decreased Interest 0  Down, Depressed, Hopeless 0  PHQ - 2 Score 0  Altered sleeping 0  Tired, decreased energy 0  Change in appetite 0  Feeling bad or failure about yourself  0  Trouble concentrating 0  Moving slowly or fidgety/restless 0  Suicidal thoughts 0  PHQ-9 Score 0    Review of Systems     Objective:   Physical Exam        Assessment & Plan:

## 2023-09-16 ENCOUNTER — Encounter
Payer: Medicare Other | Attending: Physical Medicine and Rehabilitation | Admitting: Physical Medicine and Rehabilitation

## 2023-09-16 DIAGNOSIS — S14109D Unspecified injury at unspecified level of cervical spinal cord, subsequent encounter: Secondary | ICD-10-CM | POA: Insufficient documentation

## 2023-09-16 DIAGNOSIS — G8250 Quadriplegia, unspecified: Secondary | ICD-10-CM | POA: Insufficient documentation

## 2023-09-16 DIAGNOSIS — E119 Type 2 diabetes mellitus without complications: Secondary | ICD-10-CM | POA: Diagnosis not present

## 2023-09-16 DIAGNOSIS — I959 Hypotension, unspecified: Secondary | ICD-10-CM | POA: Diagnosis not present

## 2023-09-16 DIAGNOSIS — I951 Orthostatic hypotension: Secondary | ICD-10-CM | POA: Insufficient documentation

## 2023-09-16 DIAGNOSIS — R252 Cramp and spasm: Secondary | ICD-10-CM | POA: Insufficient documentation

## 2023-09-16 DIAGNOSIS — E039 Hypothyroidism, unspecified: Secondary | ICD-10-CM | POA: Diagnosis not present

## 2023-09-17 DIAGNOSIS — E119 Type 2 diabetes mellitus without complications: Secondary | ICD-10-CM | POA: Diagnosis not present

## 2023-09-17 DIAGNOSIS — E039 Hypothyroidism, unspecified: Secondary | ICD-10-CM | POA: Diagnosis not present

## 2023-09-17 DIAGNOSIS — I959 Hypotension, unspecified: Secondary | ICD-10-CM | POA: Diagnosis not present

## 2023-09-18 DIAGNOSIS — E039 Hypothyroidism, unspecified: Secondary | ICD-10-CM | POA: Diagnosis not present

## 2023-09-18 DIAGNOSIS — I959 Hypotension, unspecified: Secondary | ICD-10-CM | POA: Diagnosis not present

## 2023-09-18 DIAGNOSIS — J9621 Acute and chronic respiratory failure with hypoxia: Secondary | ICD-10-CM | POA: Diagnosis not present

## 2023-09-18 DIAGNOSIS — Z930 Tracheostomy status: Secondary | ICD-10-CM | POA: Diagnosis not present

## 2023-09-18 DIAGNOSIS — E119 Type 2 diabetes mellitus without complications: Secondary | ICD-10-CM | POA: Diagnosis not present

## 2023-09-18 DIAGNOSIS — R1310 Dysphagia, unspecified: Secondary | ICD-10-CM | POA: Diagnosis not present

## 2023-09-18 DIAGNOSIS — G9340 Encephalopathy, unspecified: Secondary | ICD-10-CM | POA: Diagnosis not present

## 2023-09-19 DIAGNOSIS — R1310 Dysphagia, unspecified: Secondary | ICD-10-CM | POA: Diagnosis not present

## 2023-09-19 DIAGNOSIS — Z930 Tracheostomy status: Secondary | ICD-10-CM | POA: Diagnosis not present

## 2023-09-19 DIAGNOSIS — J9621 Acute and chronic respiratory failure with hypoxia: Secondary | ICD-10-CM | POA: Diagnosis not present

## 2023-09-19 DIAGNOSIS — G9340 Encephalopathy, unspecified: Secondary | ICD-10-CM | POA: Diagnosis not present

## 2023-09-20 DIAGNOSIS — G9340 Encephalopathy, unspecified: Secondary | ICD-10-CM | POA: Diagnosis not present

## 2023-09-20 DIAGNOSIS — J9621 Acute and chronic respiratory failure with hypoxia: Secondary | ICD-10-CM | POA: Diagnosis not present

## 2023-09-20 DIAGNOSIS — R1310 Dysphagia, unspecified: Secondary | ICD-10-CM | POA: Diagnosis not present

## 2023-09-20 DIAGNOSIS — Z930 Tracheostomy status: Secondary | ICD-10-CM | POA: Diagnosis not present

## 2023-09-21 DIAGNOSIS — R1310 Dysphagia, unspecified: Secondary | ICD-10-CM | POA: Diagnosis not present

## 2023-09-21 DIAGNOSIS — Z930 Tracheostomy status: Secondary | ICD-10-CM | POA: Diagnosis not present

## 2023-09-21 DIAGNOSIS — G9340 Encephalopathy, unspecified: Secondary | ICD-10-CM | POA: Diagnosis not present

## 2023-09-21 DIAGNOSIS — J9621 Acute and chronic respiratory failure with hypoxia: Secondary | ICD-10-CM | POA: Diagnosis not present

## 2023-09-21 DIAGNOSIS — E039 Hypothyroidism, unspecified: Secondary | ICD-10-CM | POA: Diagnosis not present

## 2023-09-21 DIAGNOSIS — E119 Type 2 diabetes mellitus without complications: Secondary | ICD-10-CM | POA: Diagnosis not present

## 2023-09-21 DIAGNOSIS — I959 Hypotension, unspecified: Secondary | ICD-10-CM | POA: Diagnosis not present

## 2023-09-22 DIAGNOSIS — R1310 Dysphagia, unspecified: Secondary | ICD-10-CM | POA: Diagnosis not present

## 2023-09-22 DIAGNOSIS — J9621 Acute and chronic respiratory failure with hypoxia: Secondary | ICD-10-CM | POA: Diagnosis not present

## 2023-09-22 DIAGNOSIS — E039 Hypothyroidism, unspecified: Secondary | ICD-10-CM | POA: Diagnosis not present

## 2023-09-22 DIAGNOSIS — G9340 Encephalopathy, unspecified: Secondary | ICD-10-CM | POA: Diagnosis not present

## 2023-09-22 DIAGNOSIS — I959 Hypotension, unspecified: Secondary | ICD-10-CM | POA: Diagnosis not present

## 2023-09-22 DIAGNOSIS — E119 Type 2 diabetes mellitus without complications: Secondary | ICD-10-CM | POA: Diagnosis not present

## 2023-09-22 DIAGNOSIS — Z930 Tracheostomy status: Secondary | ICD-10-CM | POA: Diagnosis not present

## 2023-09-23 DIAGNOSIS — G9340 Encephalopathy, unspecified: Secondary | ICD-10-CM | POA: Diagnosis not present

## 2023-09-23 DIAGNOSIS — J9621 Acute and chronic respiratory failure with hypoxia: Secondary | ICD-10-CM | POA: Diagnosis not present

## 2023-09-23 DIAGNOSIS — R1310 Dysphagia, unspecified: Secondary | ICD-10-CM | POA: Diagnosis not present

## 2023-09-23 DIAGNOSIS — Z930 Tracheostomy status: Secondary | ICD-10-CM | POA: Diagnosis not present

## 2023-09-24 DIAGNOSIS — Z930 Tracheostomy status: Secondary | ICD-10-CM | POA: Diagnosis not present

## 2023-09-24 DIAGNOSIS — G9340 Encephalopathy, unspecified: Secondary | ICD-10-CM | POA: Diagnosis not present

## 2023-09-24 DIAGNOSIS — R1310 Dysphagia, unspecified: Secondary | ICD-10-CM | POA: Diagnosis not present

## 2023-09-24 DIAGNOSIS — J9621 Acute and chronic respiratory failure with hypoxia: Secondary | ICD-10-CM | POA: Diagnosis not present

## 2023-09-27 DIAGNOSIS — Z930 Tracheostomy status: Secondary | ICD-10-CM | POA: Diagnosis not present

## 2023-09-27 DIAGNOSIS — R1310 Dysphagia, unspecified: Secondary | ICD-10-CM | POA: Diagnosis not present

## 2023-09-27 DIAGNOSIS — J9621 Acute and chronic respiratory failure with hypoxia: Secondary | ICD-10-CM | POA: Diagnosis not present

## 2023-09-27 DIAGNOSIS — G9340 Encephalopathy, unspecified: Secondary | ICD-10-CM | POA: Diagnosis not present

## 2023-09-28 DIAGNOSIS — G9340 Encephalopathy, unspecified: Secondary | ICD-10-CM | POA: Diagnosis not present

## 2023-09-28 DIAGNOSIS — J9621 Acute and chronic respiratory failure with hypoxia: Secondary | ICD-10-CM | POA: Diagnosis not present

## 2023-09-28 DIAGNOSIS — Z930 Tracheostomy status: Secondary | ICD-10-CM | POA: Diagnosis not present

## 2023-09-28 DIAGNOSIS — R1310 Dysphagia, unspecified: Secondary | ICD-10-CM | POA: Diagnosis not present

## 2023-09-29 DIAGNOSIS — G9340 Encephalopathy, unspecified: Secondary | ICD-10-CM | POA: Diagnosis not present

## 2023-09-29 DIAGNOSIS — Z930 Tracheostomy status: Secondary | ICD-10-CM | POA: Diagnosis not present

## 2023-09-29 DIAGNOSIS — J9621 Acute and chronic respiratory failure with hypoxia: Secondary | ICD-10-CM | POA: Diagnosis not present

## 2023-09-29 DIAGNOSIS — R1310 Dysphagia, unspecified: Secondary | ICD-10-CM | POA: Diagnosis not present

## 2023-10-03 DIAGNOSIS — Z930 Tracheostomy status: Secondary | ICD-10-CM | POA: Diagnosis not present

## 2023-10-03 DIAGNOSIS — G9341 Metabolic encephalopathy: Secondary | ICD-10-CM | POA: Diagnosis not present

## 2023-10-03 DIAGNOSIS — R1310 Dysphagia, unspecified: Secondary | ICD-10-CM | POA: Diagnosis not present

## 2023-10-03 DIAGNOSIS — J9621 Acute and chronic respiratory failure with hypoxia: Secondary | ICD-10-CM | POA: Diagnosis not present

## 2023-10-04 DIAGNOSIS — Z930 Tracheostomy status: Secondary | ICD-10-CM | POA: Diagnosis not present

## 2023-10-04 DIAGNOSIS — J9621 Acute and chronic respiratory failure with hypoxia: Secondary | ICD-10-CM | POA: Diagnosis not present

## 2023-10-04 DIAGNOSIS — R1310 Dysphagia, unspecified: Secondary | ICD-10-CM | POA: Diagnosis not present

## 2023-10-04 DIAGNOSIS — G9341 Metabolic encephalopathy: Secondary | ICD-10-CM | POA: Diagnosis not present

## 2023-10-05 ENCOUNTER — Encounter: Payer: Medicare HMO | Admitting: Physician Assistant

## 2023-10-05 DIAGNOSIS — R1310 Dysphagia, unspecified: Secondary | ICD-10-CM | POA: Diagnosis not present

## 2023-10-05 DIAGNOSIS — G9340 Encephalopathy, unspecified: Secondary | ICD-10-CM | POA: Diagnosis not present

## 2023-10-05 DIAGNOSIS — J9621 Acute and chronic respiratory failure with hypoxia: Secondary | ICD-10-CM | POA: Diagnosis not present

## 2023-10-05 DIAGNOSIS — Z930 Tracheostomy status: Secondary | ICD-10-CM | POA: Diagnosis not present

## 2023-10-06 DIAGNOSIS — Z930 Tracheostomy status: Secondary | ICD-10-CM | POA: Diagnosis not present

## 2023-10-06 DIAGNOSIS — J9621 Acute and chronic respiratory failure with hypoxia: Secondary | ICD-10-CM | POA: Diagnosis not present

## 2023-10-06 DIAGNOSIS — G9340 Encephalopathy, unspecified: Secondary | ICD-10-CM | POA: Diagnosis not present

## 2023-10-06 DIAGNOSIS — R1310 Dysphagia, unspecified: Secondary | ICD-10-CM | POA: Diagnosis not present

## 2023-10-07 DIAGNOSIS — Z930 Tracheostomy status: Secondary | ICD-10-CM

## 2023-10-07 DIAGNOSIS — J9621 Acute and chronic respiratory failure with hypoxia: Secondary | ICD-10-CM

## 2023-10-07 DIAGNOSIS — R1310 Dysphagia, unspecified: Secondary | ICD-10-CM

## 2023-10-07 DIAGNOSIS — G9340 Encephalopathy, unspecified: Secondary | ICD-10-CM

## 2023-10-08 DIAGNOSIS — J9621 Acute and chronic respiratory failure with hypoxia: Secondary | ICD-10-CM

## 2023-10-08 DIAGNOSIS — Z930 Tracheostomy status: Secondary | ICD-10-CM

## 2023-10-08 DIAGNOSIS — R1310 Dysphagia, unspecified: Secondary | ICD-10-CM

## 2023-10-08 DIAGNOSIS — G9340 Encephalopathy, unspecified: Secondary | ICD-10-CM

## 2023-10-09 DIAGNOSIS — J9621 Acute and chronic respiratory failure with hypoxia: Secondary | ICD-10-CM

## 2023-10-09 DIAGNOSIS — R1310 Dysphagia, unspecified: Secondary | ICD-10-CM

## 2023-10-09 DIAGNOSIS — G9340 Encephalopathy, unspecified: Secondary | ICD-10-CM

## 2023-10-09 DIAGNOSIS — Z930 Tracheostomy status: Secondary | ICD-10-CM

## 2023-10-10 DIAGNOSIS — J9621 Acute and chronic respiratory failure with hypoxia: Secondary | ICD-10-CM

## 2023-10-10 DIAGNOSIS — G9340 Encephalopathy, unspecified: Secondary | ICD-10-CM

## 2023-10-10 DIAGNOSIS — Z930 Tracheostomy status: Secondary | ICD-10-CM

## 2023-10-10 DIAGNOSIS — R1310 Dysphagia, unspecified: Secondary | ICD-10-CM

## 2023-10-19 ENCOUNTER — Encounter: Payer: Medicare HMO | Admitting: Physician Assistant

## 2023-10-19 DIAGNOSIS — Z930 Tracheostomy status: Secondary | ICD-10-CM

## 2023-10-19 DIAGNOSIS — R1310 Dysphagia, unspecified: Secondary | ICD-10-CM

## 2023-10-19 DIAGNOSIS — J9621 Acute and chronic respiratory failure with hypoxia: Secondary | ICD-10-CM

## 2023-10-19 DIAGNOSIS — G9340 Encephalopathy, unspecified: Secondary | ICD-10-CM

## 2023-10-20 DIAGNOSIS — R1310 Dysphagia, unspecified: Secondary | ICD-10-CM

## 2023-10-20 DIAGNOSIS — Z930 Tracheostomy status: Secondary | ICD-10-CM

## 2023-10-20 DIAGNOSIS — J9621 Acute and chronic respiratory failure with hypoxia: Secondary | ICD-10-CM

## 2023-10-20 DIAGNOSIS — G9340 Encephalopathy, unspecified: Secondary | ICD-10-CM

## 2023-10-21 DIAGNOSIS — I959 Hypotension, unspecified: Secondary | ICD-10-CM

## 2023-10-21 DIAGNOSIS — Z930 Tracheostomy status: Secondary | ICD-10-CM

## 2023-10-21 DIAGNOSIS — R1310 Dysphagia, unspecified: Secondary | ICD-10-CM

## 2023-10-21 DIAGNOSIS — J9621 Acute and chronic respiratory failure with hypoxia: Secondary | ICD-10-CM

## 2023-10-21 DIAGNOSIS — E119 Type 2 diabetes mellitus without complications: Secondary | ICD-10-CM

## 2023-10-21 DIAGNOSIS — G9340 Encephalopathy, unspecified: Secondary | ICD-10-CM

## 2023-10-21 DIAGNOSIS — E039 Hypothyroidism, unspecified: Secondary | ICD-10-CM

## 2023-10-22 DIAGNOSIS — J9621 Acute and chronic respiratory failure with hypoxia: Secondary | ICD-10-CM

## 2023-10-22 DIAGNOSIS — G9340 Encephalopathy, unspecified: Secondary | ICD-10-CM

## 2023-10-22 DIAGNOSIS — R1310 Dysphagia, unspecified: Secondary | ICD-10-CM

## 2023-10-22 DIAGNOSIS — Z930 Tracheostomy status: Secondary | ICD-10-CM

## 2023-10-23 DIAGNOSIS — G9340 Encephalopathy, unspecified: Secondary | ICD-10-CM

## 2023-10-23 DIAGNOSIS — R1310 Dysphagia, unspecified: Secondary | ICD-10-CM

## 2023-10-23 DIAGNOSIS — J9621 Acute and chronic respiratory failure with hypoxia: Secondary | ICD-10-CM

## 2023-10-23 DIAGNOSIS — Z930 Tracheostomy status: Secondary | ICD-10-CM

## 2023-10-24 DIAGNOSIS — J9621 Acute and chronic respiratory failure with hypoxia: Secondary | ICD-10-CM

## 2023-10-24 DIAGNOSIS — Z930 Tracheostomy status: Secondary | ICD-10-CM

## 2023-10-24 DIAGNOSIS — G9340 Encephalopathy, unspecified: Secondary | ICD-10-CM

## 2023-10-24 DIAGNOSIS — R1310 Dysphagia, unspecified: Secondary | ICD-10-CM

## 2023-10-25 DIAGNOSIS — J9621 Acute and chronic respiratory failure with hypoxia: Secondary | ICD-10-CM

## 2023-10-25 DIAGNOSIS — Z930 Tracheostomy status: Secondary | ICD-10-CM

## 2023-10-25 DIAGNOSIS — R1310 Dysphagia, unspecified: Secondary | ICD-10-CM

## 2023-10-25 DIAGNOSIS — G9340 Encephalopathy, unspecified: Secondary | ICD-10-CM

## 2023-11-05 ENCOUNTER — Encounter: Payer: Self-pay | Admitting: Physician Assistant

## 2023-11-05 DEATH — deceased

## 2024-05-11 ENCOUNTER — Telehealth: Payer: Self-pay | Admitting: Internal Medicine

## 2024-05-11 NOTE — Telephone Encounter (Signed)
 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   Patient is currently deceased.  Husband thanked me for calling with an update.  GB
